# Patient Record
Sex: Female | Born: 1940 | Race: Black or African American | Hispanic: No | Marital: Married | Smoking: Former smoker
Health system: Southern US, Community
[De-identification: ages and names within clinical notes are randomized; demographics above are authoritative.]

## PROBLEM LIST (undated history)

## (undated) DIAGNOSIS — I82409 Acute embolism and thrombosis of unspecified deep veins of unspecified lower extremity: Secondary | ICD-10-CM

## (undated) DIAGNOSIS — E039 Hypothyroidism, unspecified: Secondary | ICD-10-CM

## (undated) DIAGNOSIS — D573 Sickle-cell trait: Secondary | ICD-10-CM

## (undated) DIAGNOSIS — N183 Chronic kidney disease, stage 3 unspecified: Secondary | ICD-10-CM

## (undated) DIAGNOSIS — I493 Ventricular premature depolarization: Secondary | ICD-10-CM

## (undated) DIAGNOSIS — E7800 Pure hypercholesterolemia, unspecified: Secondary | ICD-10-CM

## (undated) DIAGNOSIS — M069 Rheumatoid arthritis, unspecified: Secondary | ICD-10-CM

## (undated) DIAGNOSIS — A159 Respiratory tuberculosis unspecified: Secondary | ICD-10-CM

## (undated) DIAGNOSIS — E785 Hyperlipidemia, unspecified: Secondary | ICD-10-CM

## (undated) DIAGNOSIS — R7611 Nonspecific reaction to tuberculin skin test without active tuberculosis: Secondary | ICD-10-CM

## (undated) DIAGNOSIS — I350 Nonrheumatic aortic (valve) stenosis: Principal | ICD-10-CM

## (undated) DIAGNOSIS — D329 Benign neoplasm of meninges, unspecified: Secondary | ICD-10-CM

## (undated) DIAGNOSIS — K5790 Diverticulosis of intestine, part unspecified, without perforation or abscess without bleeding: Secondary | ICD-10-CM

## (undated) DIAGNOSIS — G629 Polyneuropathy, unspecified: Secondary | ICD-10-CM

## (undated) DIAGNOSIS — I10 Essential (primary) hypertension: Secondary | ICD-10-CM

## (undated) DIAGNOSIS — G8929 Other chronic pain: Secondary | ICD-10-CM

## (undated) DIAGNOSIS — D649 Anemia, unspecified: Secondary | ICD-10-CM

## (undated) DIAGNOSIS — I509 Heart failure, unspecified: Secondary | ICD-10-CM

## (undated) DIAGNOSIS — H269 Unspecified cataract: Secondary | ICD-10-CM

## (undated) DIAGNOSIS — M1990 Unspecified osteoarthritis, unspecified site: Secondary | ICD-10-CM

## (undated) HISTORY — PX: OTHER SURGICAL HISTORY: SHX169

## (undated) HISTORY — DX: Ventricular premature depolarization: I49.3

## (undated) HISTORY — PX: TONSILLECTOMY AND ADENOIDECTOMY: SHX28

## (undated) HISTORY — DX: Unspecified cataract: H26.9

## (undated) HISTORY — DX: Hyperlipidemia, unspecified: E78.5

## (undated) HISTORY — DX: Respiratory tuberculosis unspecified: A15.9

## (undated) HISTORY — PX: CATARACT EXTRACTION: SUR2

## (undated) HISTORY — DX: Nonrheumatic aortic (valve) stenosis: I35.0

---

## 1976-01-07 HISTORY — PX: ABDOMINAL HYSTERECTOMY: SHX81

## 1990-01-06 HISTORY — PX: TOTAL THYROIDECTOMY: SHX2547

## 1997-01-06 HISTORY — PX: ROTATOR CUFF REPAIR: SHX139

## 1997-09-23 ENCOUNTER — Emergency Department (HOSPITAL_COMMUNITY): Admission: EM | Admit: 1997-09-23 | Discharge: 1997-09-23 | Payer: Self-pay

## 1997-11-06 ENCOUNTER — Emergency Department (HOSPITAL_COMMUNITY): Admission: EM | Admit: 1997-11-06 | Discharge: 1997-11-06 | Payer: Self-pay | Admitting: Emergency Medicine

## 1997-11-06 ENCOUNTER — Encounter: Payer: Self-pay | Admitting: Emergency Medicine

## 1997-11-10 ENCOUNTER — Emergency Department (HOSPITAL_COMMUNITY): Admission: EM | Admit: 1997-11-10 | Discharge: 1997-11-10 | Payer: Self-pay | Admitting: Emergency Medicine

## 1997-12-05 ENCOUNTER — Encounter: Payer: Self-pay | Admitting: Internal Medicine

## 1997-12-05 ENCOUNTER — Ambulatory Visit (HOSPITAL_COMMUNITY): Admission: RE | Admit: 1997-12-05 | Discharge: 1997-12-05 | Payer: Self-pay | Admitting: Internal Medicine

## 1998-12-20 ENCOUNTER — Other Ambulatory Visit: Admission: RE | Admit: 1998-12-20 | Discharge: 1998-12-20 | Payer: Self-pay | Admitting: Obstetrics and Gynecology

## 1999-01-07 HISTORY — PX: JOINT REPLACEMENT: SHX530

## 1999-11-04 ENCOUNTER — Inpatient Hospital Stay (HOSPITAL_COMMUNITY): Admission: RE | Admit: 1999-11-04 | Discharge: 1999-11-12 | Payer: Self-pay | Admitting: Orthopedic Surgery

## 1999-11-06 ENCOUNTER — Encounter: Payer: Self-pay | Admitting: Orthopedic Surgery

## 1999-11-08 ENCOUNTER — Encounter: Payer: Self-pay | Admitting: Orthopedic Surgery

## 1999-11-10 ENCOUNTER — Encounter: Payer: Self-pay | Admitting: Internal Medicine

## 1999-11-12 ENCOUNTER — Inpatient Hospital Stay
Admission: RE | Admit: 1999-11-12 | Discharge: 1999-11-27 | Payer: Self-pay | Admitting: Physical Medicine & Rehabilitation

## 2000-05-18 ENCOUNTER — Ambulatory Visit (HOSPITAL_BASED_OUTPATIENT_CLINIC_OR_DEPARTMENT_OTHER): Admission: RE | Admit: 2000-05-18 | Discharge: 2000-05-18 | Payer: Self-pay | Admitting: Orthopedic Surgery

## 2000-09-16 ENCOUNTER — Emergency Department (HOSPITAL_COMMUNITY): Admission: EM | Admit: 2000-09-16 | Discharge: 2000-09-16 | Payer: Self-pay | Admitting: Emergency Medicine

## 2000-09-16 ENCOUNTER — Encounter: Payer: Self-pay | Admitting: Emergency Medicine

## 2001-02-09 ENCOUNTER — Encounter: Payer: Self-pay | Admitting: Family Medicine

## 2001-02-09 ENCOUNTER — Encounter: Admission: RE | Admit: 2001-02-09 | Discharge: 2001-02-09 | Payer: Self-pay | Admitting: Family Medicine

## 2001-10-14 ENCOUNTER — Encounter: Payer: Self-pay | Admitting: Orthopedic Surgery

## 2001-10-14 ENCOUNTER — Encounter: Admission: RE | Admit: 2001-10-14 | Discharge: 2001-10-14 | Payer: Self-pay | Admitting: Orthopedic Surgery

## 2002-03-13 ENCOUNTER — Emergency Department (HOSPITAL_COMMUNITY): Admission: EM | Admit: 2002-03-13 | Discharge: 2002-03-13 | Payer: Self-pay | Admitting: *Deleted

## 2002-04-26 ENCOUNTER — Encounter: Payer: Self-pay | Admitting: Internal Medicine

## 2002-04-26 ENCOUNTER — Ambulatory Visit (HOSPITAL_COMMUNITY): Admission: RE | Admit: 2002-04-26 | Discharge: 2002-04-26 | Payer: Self-pay | Admitting: Internal Medicine

## 2002-11-22 ENCOUNTER — Other Ambulatory Visit: Admission: RE | Admit: 2002-11-22 | Discharge: 2002-11-22 | Payer: Self-pay | Admitting: Obstetrics and Gynecology

## 2003-07-20 ENCOUNTER — Ambulatory Visit (HOSPITAL_COMMUNITY): Admission: RE | Admit: 2003-07-20 | Discharge: 2003-07-20 | Payer: Self-pay | Admitting: Internal Medicine

## 2003-07-21 ENCOUNTER — Ambulatory Visit (HOSPITAL_COMMUNITY): Admission: RE | Admit: 2003-07-21 | Discharge: 2003-07-21 | Payer: Self-pay | Admitting: Internal Medicine

## 2003-10-17 ENCOUNTER — Ambulatory Visit (HOSPITAL_COMMUNITY): Admission: RE | Admit: 2003-10-17 | Discharge: 2003-10-17 | Payer: Self-pay | Admitting: Internal Medicine

## 2004-03-07 ENCOUNTER — Ambulatory Visit (HOSPITAL_COMMUNITY): Admission: RE | Admit: 2004-03-07 | Discharge: 2004-03-07 | Payer: Self-pay | Admitting: Neurosurgery

## 2004-07-24 ENCOUNTER — Encounter: Admission: RE | Admit: 2004-07-24 | Discharge: 2004-07-24 | Payer: Self-pay | Admitting: Rheumatology

## 2004-08-02 ENCOUNTER — Ambulatory Visit: Payer: Self-pay | Admitting: Infectious Diseases

## 2005-01-01 ENCOUNTER — Other Ambulatory Visit: Admission: RE | Admit: 2005-01-01 | Discharge: 2005-01-01 | Payer: Self-pay | Admitting: Obstetrics and Gynecology

## 2005-03-05 ENCOUNTER — Other Ambulatory Visit: Admission: RE | Admit: 2005-03-05 | Discharge: 2005-03-05 | Payer: Self-pay | Admitting: Radiology

## 2005-03-08 ENCOUNTER — Encounter: Admission: RE | Admit: 2005-03-08 | Discharge: 2005-03-08 | Payer: Self-pay | Admitting: Orthopedic Surgery

## 2005-09-30 ENCOUNTER — Ambulatory Visit (HOSPITAL_COMMUNITY): Admission: RE | Admit: 2005-09-30 | Discharge: 2005-09-30 | Payer: Self-pay | Admitting: Internal Medicine

## 2005-10-15 ENCOUNTER — Ambulatory Visit (HOSPITAL_BASED_OUTPATIENT_CLINIC_OR_DEPARTMENT_OTHER): Admission: RE | Admit: 2005-10-15 | Discharge: 2005-10-15 | Payer: Self-pay | Admitting: Surgery

## 2005-10-15 ENCOUNTER — Encounter (INDEPENDENT_AMBULATORY_CARE_PROVIDER_SITE_OTHER): Payer: Self-pay | Admitting: Specialist

## 2006-01-06 DIAGNOSIS — D329 Benign neoplasm of meninges, unspecified: Secondary | ICD-10-CM

## 2006-01-06 HISTORY — DX: Benign neoplasm of meninges, unspecified: D32.9

## 2006-04-08 ENCOUNTER — Encounter: Admission: RE | Admit: 2006-04-08 | Discharge: 2006-04-08 | Payer: Self-pay | Admitting: Gastroenterology

## 2006-08-12 ENCOUNTER — Encounter: Admission: RE | Admit: 2006-08-12 | Discharge: 2006-08-12 | Payer: Self-pay | Admitting: Family Medicine

## 2006-08-29 ENCOUNTER — Encounter: Admission: RE | Admit: 2006-08-29 | Discharge: 2006-08-29 | Payer: Self-pay | Admitting: Otolaryngology

## 2006-09-24 ENCOUNTER — Encounter: Admission: RE | Admit: 2006-09-24 | Discharge: 2006-09-24 | Payer: Self-pay | Admitting: Otolaryngology

## 2006-10-21 ENCOUNTER — Encounter: Admission: RE | Admit: 2006-10-21 | Discharge: 2006-10-21 | Payer: Self-pay | Admitting: Neurosurgery

## 2006-12-28 ENCOUNTER — Ambulatory Visit: Payer: Self-pay | Admitting: Vascular Surgery

## 2006-12-28 ENCOUNTER — Emergency Department (HOSPITAL_COMMUNITY): Admission: EM | Admit: 2006-12-28 | Discharge: 2006-12-28 | Payer: Self-pay | Admitting: Podiatry

## 2007-01-25 ENCOUNTER — Encounter: Admission: RE | Admit: 2007-01-25 | Discharge: 2007-01-25 | Payer: Self-pay | Admitting: Neurosurgery

## 2007-06-09 ENCOUNTER — Ambulatory Visit: Payer: Self-pay | Admitting: Vascular Surgery

## 2007-07-10 ENCOUNTER — Inpatient Hospital Stay (HOSPITAL_COMMUNITY): Admission: EM | Admit: 2007-07-10 | Discharge: 2007-07-13 | Payer: Self-pay | Admitting: Emergency Medicine

## 2007-07-10 ENCOUNTER — Ambulatory Visit: Payer: Self-pay | Admitting: Internal Medicine

## 2007-07-22 ENCOUNTER — Encounter: Admission: RE | Admit: 2007-07-22 | Discharge: 2007-07-22 | Payer: Self-pay | Admitting: Neurosurgery

## 2007-08-31 ENCOUNTER — Encounter: Admission: RE | Admit: 2007-08-31 | Discharge: 2007-08-31 | Payer: Self-pay | Admitting: Gastroenterology

## 2007-11-07 HISTORY — PX: OTHER SURGICAL HISTORY: SHX169

## 2007-11-09 ENCOUNTER — Encounter (INDEPENDENT_AMBULATORY_CARE_PROVIDER_SITE_OTHER): Payer: Self-pay | Admitting: Neurosurgery

## 2007-11-09 ENCOUNTER — Inpatient Hospital Stay (HOSPITAL_COMMUNITY): Admission: RE | Admit: 2007-11-09 | Discharge: 2007-11-17 | Payer: Self-pay | Admitting: Neurosurgery

## 2007-12-01 ENCOUNTER — Encounter: Admission: RE | Admit: 2007-12-01 | Discharge: 2007-12-01 | Payer: Self-pay | Admitting: Family Medicine

## 2007-12-20 ENCOUNTER — Emergency Department (HOSPITAL_COMMUNITY): Admission: EM | Admit: 2007-12-20 | Discharge: 2007-12-20 | Payer: Self-pay | Admitting: Emergency Medicine

## 2008-02-09 ENCOUNTER — Emergency Department (HOSPITAL_COMMUNITY): Admission: EM | Admit: 2008-02-09 | Discharge: 2008-02-09 | Payer: Self-pay | Admitting: Emergency Medicine

## 2008-05-25 ENCOUNTER — Encounter: Admission: RE | Admit: 2008-05-25 | Discharge: 2008-05-25 | Payer: Self-pay | Admitting: Family Medicine

## 2008-08-22 ENCOUNTER — Encounter: Admission: RE | Admit: 2008-08-22 | Discharge: 2008-08-22 | Payer: Self-pay | Admitting: Neurosurgery

## 2008-11-24 ENCOUNTER — Encounter: Admission: RE | Admit: 2008-11-24 | Discharge: 2008-11-24 | Payer: Self-pay | Admitting: Gastroenterology

## 2009-05-27 ENCOUNTER — Inpatient Hospital Stay (HOSPITAL_COMMUNITY): Admission: EM | Admit: 2009-05-27 | Discharge: 2009-05-31 | Payer: Self-pay | Admitting: Emergency Medicine

## 2009-05-28 ENCOUNTER — Encounter (INDEPENDENT_AMBULATORY_CARE_PROVIDER_SITE_OTHER): Payer: Self-pay | Admitting: Internal Medicine

## 2009-08-08 ENCOUNTER — Encounter: Admission: RE | Admit: 2009-08-08 | Discharge: 2009-08-08 | Payer: Self-pay | Admitting: Neurosurgery

## 2009-11-21 ENCOUNTER — Observation Stay (HOSPITAL_COMMUNITY)
Admission: EM | Admit: 2009-11-21 | Discharge: 2009-11-25 | Payer: Self-pay | Source: Home / Self Care | Admitting: Emergency Medicine

## 2009-11-27 ENCOUNTER — Inpatient Hospital Stay (HOSPITAL_COMMUNITY)
Admission: EM | Admit: 2009-11-27 | Discharge: 2009-12-04 | Payer: Self-pay | Source: Home / Self Care | Admitting: Emergency Medicine

## 2009-12-04 ENCOUNTER — Ambulatory Visit: Payer: Self-pay | Admitting: Oncology

## 2009-12-12 LAB — CBC & DIFF AND RETIC
Basophils Absolute: 0 10*3/uL (ref 0.0–0.1)
Eosinophils Absolute: 0.3 10*3/uL (ref 0.0–0.5)
HCT: 28.7 % — ABNORMAL LOW (ref 34.8–46.6)
HGB: 8.9 g/dL — ABNORMAL LOW (ref 11.6–15.9)
Immature Retic Fract: 18.7 % — ABNORMAL HIGH (ref 0.00–10.70)
LYMPH%: 44.8 % (ref 14.0–49.7)
MCV: 79.1 fL — ABNORMAL LOW (ref 79.5–101.0)
MONO#: 0.4 10*3/uL (ref 0.1–0.9)
MONO%: 5.2 % (ref 0.0–14.0)
NEUT#: 3.4 10*3/uL (ref 1.5–6.5)
Platelets: 337 10*3/uL (ref 145–400)

## 2009-12-14 LAB — COMPREHENSIVE METABOLIC PANEL
ALT: 9 U/L (ref 0–35)
Albumin: 3.9 g/dL (ref 3.5–5.2)
Alkaline Phosphatase: 77 U/L (ref 39–117)
CO2: 26 mEq/L (ref 19–32)
Glucose, Bld: 161 mg/dL — ABNORMAL HIGH (ref 70–99)
Potassium: 3.4 mEq/L — ABNORMAL LOW (ref 3.5–5.3)
Sodium: 144 mEq/L (ref 135–145)
Total Protein: 6.4 g/dL (ref 6.0–8.3)

## 2009-12-14 LAB — ERYTHROPOIETIN: Erythropoietin: 124 m[IU]/mL — ABNORMAL HIGH (ref 2.6–34.0)

## 2009-12-14 LAB — SPEP & IFE WITH QIG
Albumin ELP: 52 % — ABNORMAL LOW (ref 55.8–66.1)
Alpha-2-Globulin: 11.2 % (ref 7.1–11.8)
Beta 2: 6.5 % (ref 3.2–6.5)
Beta Globulin: 6.6 % (ref 4.7–7.2)
IgA: 404 mg/dL — ABNORMAL HIGH (ref 68–378)
Total Protein, Serum Electrophoresis: 6.4 g/dL (ref 6.0–8.3)

## 2009-12-14 LAB — IRON AND TIBC
Iron: 35 ug/dL — ABNORMAL LOW (ref 42–145)
UIBC: 170 ug/dL

## 2009-12-14 LAB — HEMOGLOBINOPATHY EVALUATION
Hgb A2 Quant: 2.9 % (ref 2.2–3.2)
Hgb F Quant: 0 % (ref 0.0–2.0)
Hgb S Quant: 33.7 % — ABNORMAL HIGH (ref 0.0–0.0)

## 2009-12-14 LAB — FERRITIN: Ferritin: 265 ng/mL (ref 10–291)

## 2010-01-06 HISTORY — PX: SIGMOIDECTOMY: SHX176

## 2010-01-09 ENCOUNTER — Ambulatory Visit: Payer: Self-pay | Admitting: Oncology

## 2010-01-10 LAB — CBC WITH DIFFERENTIAL/PLATELET
BASO%: 0.4 % (ref 0.0–2.0)
Basophils Absolute: 0 10*3/uL (ref 0.0–0.1)
EOS%: 1.5 % (ref 0.0–7.0)
Eosinophils Absolute: 0.1 10*3/uL (ref 0.0–0.5)
HCT: 29.8 % — ABNORMAL LOW (ref 34.8–46.6)
HGB: 9.7 g/dL — ABNORMAL LOW (ref 11.6–15.9)
LYMPH%: 38.1 % (ref 14.0–49.7)
MCH: 26.5 pg (ref 25.1–34.0)
MCHC: 32.5 g/dL (ref 31.5–36.0)
MCV: 81.4 fL (ref 79.5–101.0)
MONO#: 0.7 10*3/uL (ref 0.1–0.9)
MONO%: 6.9 % (ref 0.0–14.0)
NEUT#: 5.3 10*3/uL (ref 1.5–6.5)
NEUT%: 53.1 % (ref 38.4–76.8)
Platelets: 446 10*3/uL — ABNORMAL HIGH (ref 145–400)
RBC: 3.66 10*6/uL — ABNORMAL LOW (ref 3.70–5.45)
RDW: 23.6 % — ABNORMAL HIGH (ref 11.2–14.5)
WBC: 9.9 10*3/uL (ref 3.9–10.3)
lymph#: 3.8 10*3/uL — ABNORMAL HIGH (ref 0.9–3.3)

## 2010-01-10 LAB — URINALYSIS, MICROSCOPIC - CHCC
Glucose: NEGATIVE g/dL
Protein: 2000 mg/dL
Specific Gravity, Urine: 1.03 (ref 1.003–1.035)
pH: 6 (ref 4.6–8.0)

## 2010-01-24 ENCOUNTER — Ambulatory Visit (HOSPITAL_COMMUNITY)
Admission: RE | Admit: 2010-01-24 | Discharge: 2010-01-24 | Payer: Self-pay | Source: Home / Self Care | Attending: Gastroenterology | Admitting: Gastroenterology

## 2010-01-25 NOTE — Op Note (Signed)
  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  MEDICATIONS:  Per Anesthesia, propofol 227 mg IV, fentanyl 100 mcg IV, Versed 2 mg IV.  FINDINGS:  Rectal exam was normal.  A pediatric colonoscope was inserted into a fair prepped colon and advanced to the descending colon where there was a significant edema with an ulcerated mass appearance in the descending colon approximately 45 cm from the rectum.  There was diffuse diverticula distal to this area and some diverticula noted near this area as well.  This area was very friable, ulcerated and edematous, concerning for malignancy.  The lumen could not be identified and the colonoscope could not pass proximal to this area.  Biopsies were taken for histologic purposes.  2 cc of Spot were injected in the distal mucosa from the lesion for tattooing and was successful.  On careful withdrawal of the colonoscope, diffuse diverticulosis was seen in the sigmoid colon.  Retroflexion revealed medium-sized internal hemorrhoids.  ASSESSMENT: 1. Obstructing lesion at 45 cm concerning for malignancy, although     complicated diverticulitis could have this appearance as well.     Followup on pathology. 2. Status post tattooing of distal part of colonic site. 3. Diffuse sigmoid diverticulosis. 4. Medium-sized internal hemorrhoids.  PLAN: 1. Follow up on pathology. 2. Discussed findings with Dr. Greer [name] for further management of this obstruction.     Julia Ng, MD     VCS/MEDQ  D:  01/24/2010  T:  01/24/2010  Job:  [id number]  cc:   Julia Ruff. Redmond Pulling, MD [address] [place] [address]  Julia Coma, MD Fax: [phone]  Electronically Signed by Julia Corner MD on 01/25/2010 10:48:45 AM

## 2010-01-27 ENCOUNTER — Encounter: Payer: Self-pay | Admitting: Neurosurgery

## 2010-03-07 HISTORY — PX: OTHER SURGICAL HISTORY: SHX169

## 2010-03-12 ENCOUNTER — Other Ambulatory Visit: Payer: Self-pay | Admitting: Medical

## 2010-03-12 ENCOUNTER — Encounter (HOSPITAL_BASED_OUTPATIENT_CLINIC_OR_DEPARTMENT_OTHER): Payer: Medicare Other | Admitting: Oncology

## 2010-03-12 DIAGNOSIS — D539 Nutritional anemia, unspecified: Secondary | ICD-10-CM

## 2010-03-12 LAB — CBC WITH DIFFERENTIAL/PLATELET
Basophils Absolute: 0 10*3/uL (ref 0.0–0.1)
EOS%: 2.3 % (ref 0.0–7.0)
Eosinophils Absolute: 0.2 10*3/uL (ref 0.0–0.5)
HCT: 31.2 % — ABNORMAL LOW (ref 34.8–46.6)
HGB: 10.2 g/dL — ABNORMAL LOW (ref 11.6–15.9)
LYMPH%: 34.7 % (ref 14.0–49.7)
MCH: 26.6 pg (ref 25.1–34.0)
MCV: 81.9 fL (ref 79.5–101.0)
MONO%: 5.4 % (ref 0.0–14.0)
NEUT#: 4.2 10*3/uL (ref 1.5–6.5)
NEUT%: 57 % (ref 38.4–76.8)
Platelets: 481 10*3/uL — ABNORMAL HIGH (ref 145–400)
RDW: 18.9 % — ABNORMAL HIGH (ref 11.2–14.5)

## 2010-03-13 LAB — COMPREHENSIVE METABOLIC PANEL
AST: 12 U/L (ref 0–37)
Alkaline Phosphatase: 80 U/L (ref 39–117)
BUN: 10 mg/dL (ref 6–23)
Creatinine, Ser: 1.14 mg/dL (ref 0.40–1.20)
Glucose, Bld: 94 mg/dL (ref 70–99)
Total Bilirubin: 0.5 mg/dL (ref 0.3–1.2)

## 2010-03-13 LAB — IRON AND TIBC
%SAT: 10 % — ABNORMAL LOW (ref 20–55)
TIBC: 222 ug/dL — ABNORMAL LOW (ref 250–470)
UIBC: 200 ug/dL

## 2010-03-13 LAB — ERYTHROPOIETIN: Erythropoietin: 26.6 m[IU]/mL (ref 2.6–34.0)

## 2010-03-13 LAB — FERRITIN: Ferritin: 333 ng/mL — ABNORMAL HIGH (ref 10–291)

## 2010-03-19 LAB — URINALYSIS, ROUTINE W REFLEX MICROSCOPIC
Bilirubin Urine: NEGATIVE
Nitrite: NEGATIVE
Nitrite: POSITIVE — AB
Protein, ur: >300 mg/dL — AB
Specific Gravity, Urine: 1.015 (ref 1.005–1.030)
Urobilinogen, UA: 1 mg/dL (ref 0.0–1.0)
Urobilinogen, UA: 1 mg/dL (ref 0.0–1.0)
pH: 5.5 (ref 5.0–8.0)

## 2010-03-19 LAB — CULTURE, BLOOD (ROUTINE X 2)
Culture  Setup Time: 201111170656
Culture: NO GROWTH
Culture: NO GROWTH
Culture: NO GROWTH

## 2010-03-19 LAB — HEMOGLOBIN AND HEMATOCRIT, BLOOD
HCT: 19.1 % — ABNORMAL LOW (ref 36.0–46.0)
HCT: 19.8 % — ABNORMAL LOW (ref 36.0–46.0)
Hemoglobin: 6.3 g/dL — CL (ref 12.0–15.0)

## 2010-03-19 LAB — CBC
HCT: 21.1 % — ABNORMAL LOW (ref 36.0–46.0)
HCT: 25.8 % — ABNORMAL LOW (ref 36.0–46.0)
Hemoglobin: 6.2 g/dL — CL (ref 12.0–15.0)
Hemoglobin: 6.4 g/dL — CL (ref 12.0–15.0)
Hemoglobin: 6.7 g/dL — CL (ref 12.0–15.0)
Hemoglobin: 6.9 g/dL — CL (ref 12.0–15.0)
Hemoglobin: 7.6 g/dL — ABNORMAL LOW (ref 12.0–15.0)
MCH: 25.1 pg — ABNORMAL LOW (ref 26.0–34.0)
MCH: 25.2 pg — ABNORMAL LOW (ref 26.0–34.0)
MCH: 25.2 pg — ABNORMAL LOW (ref 26.0–34.0)
MCH: 25.4 pg — ABNORMAL LOW (ref 26.0–34.0)
MCH: 25.4 pg — ABNORMAL LOW (ref 26.0–34.0)
MCHC: 32.9 g/dL (ref 30.0–36.0)
MCHC: 32.9 g/dL (ref 30.0–36.0)
MCHC: 33.4 g/dL (ref 30.0–36.0)
MCV: 76.1 fL — ABNORMAL LOW (ref 78.0–100.0)
MCV: 76.5 fL — ABNORMAL LOW (ref 78.0–100.0)
MCV: 76.7 fL — ABNORMAL LOW (ref 78.0–100.0)
MCV: 76.9 fL — ABNORMAL LOW (ref 78.0–100.0)
MCV: 77.8 fL — ABNORMAL LOW (ref 78.0–100.0)
Platelets: 332 10*3/uL (ref 150–400)
Platelets: 350 10*3/uL (ref 150–400)
Platelets: 360 10*3/uL (ref 150–400)
Platelets: 389 10*3/uL (ref 150–400)
Platelets: 417 10*3/uL — ABNORMAL HIGH (ref 150–400)
Platelets: 440 10*3/uL — ABNORMAL HIGH (ref 150–400)
Platelets: 514 10*3/uL — ABNORMAL HIGH (ref 150–400)
RBC: 2.42 MIL/uL — ABNORMAL LOW (ref 3.87–5.11)
RBC: 2.51 MIL/uL — ABNORMAL LOW (ref 3.87–5.11)
RBC: 2.64 MIL/uL — ABNORMAL LOW (ref 3.87–5.11)
RBC: 2.73 MIL/uL — ABNORMAL LOW (ref 3.87–5.11)
RBC: 3.03 MIL/uL — ABNORMAL LOW (ref 3.87–5.11)
RBC: 3.36 MIL/uL — ABNORMAL LOW (ref 3.87–5.11)
RDW: 20.3 % — ABNORMAL HIGH (ref 11.5–15.5)
RDW: 20.8 % — ABNORMAL HIGH (ref 11.5–15.5)
RDW: 21 % — ABNORMAL HIGH (ref 11.5–15.5)
RDW: 22.5 % — ABNORMAL HIGH (ref 11.5–15.5)
WBC: 11.3 10*3/uL — ABNORMAL HIGH (ref 4.0–10.5)
WBC: 15.6 10*3/uL — ABNORMAL HIGH (ref 4.0–10.5)
WBC: 6.7 10*3/uL (ref 4.0–10.5)
WBC: 7.5 10*3/uL (ref 4.0–10.5)
WBC: 7.7 10*3/uL (ref 4.0–10.5)

## 2010-03-19 LAB — GLUCOSE, CAPILLARY
Glucose-Capillary: 102 mg/dL — ABNORMAL HIGH (ref 70–99)
Glucose-Capillary: 107 mg/dL — ABNORMAL HIGH (ref 70–99)
Glucose-Capillary: 122 mg/dL — ABNORMAL HIGH (ref 70–99)
Glucose-Capillary: 128 mg/dL — ABNORMAL HIGH (ref 70–99)
Glucose-Capillary: 130 mg/dL — ABNORMAL HIGH (ref 70–99)
Glucose-Capillary: 135 mg/dL — ABNORMAL HIGH (ref 70–99)
Glucose-Capillary: 137 mg/dL — ABNORMAL HIGH (ref 70–99)
Glucose-Capillary: 139 mg/dL — ABNORMAL HIGH (ref 70–99)
Glucose-Capillary: 143 mg/dL — ABNORMAL HIGH (ref 70–99)
Glucose-Capillary: 144 mg/dL — ABNORMAL HIGH (ref 70–99)
Glucose-Capillary: 144 mg/dL — ABNORMAL HIGH (ref 70–99)
Glucose-Capillary: 145 mg/dL — ABNORMAL HIGH (ref 70–99)
Glucose-Capillary: 161 mg/dL — ABNORMAL HIGH (ref 70–99)
Glucose-Capillary: 183 mg/dL — ABNORMAL HIGH (ref 70–99)
Glucose-Capillary: 202 mg/dL — ABNORMAL HIGH (ref 70–99)
Glucose-Capillary: 207 mg/dL — ABNORMAL HIGH (ref 70–99)
Glucose-Capillary: 208 mg/dL — ABNORMAL HIGH (ref 70–99)
Glucose-Capillary: 231 mg/dL — ABNORMAL HIGH (ref 70–99)
Glucose-Capillary: 233 mg/dL — ABNORMAL HIGH (ref 70–99)
Glucose-Capillary: 270 mg/dL — ABNORMAL HIGH (ref 70–99)
Glucose-Capillary: 288 mg/dL — ABNORMAL HIGH (ref 70–99)
Glucose-Capillary: 302 mg/dL — ABNORMAL HIGH (ref 70–99)
Glucose-Capillary: 335 mg/dL — ABNORMAL HIGH (ref 70–99)
Glucose-Capillary: 41 mg/dL — CL (ref 70–99)
Glucose-Capillary: 439 mg/dL — ABNORMAL HIGH (ref 70–99)
Glucose-Capillary: 95 mg/dL (ref 70–99)

## 2010-03-19 LAB — URINE CULTURE: Culture  Setup Time: 201111170628

## 2010-03-19 LAB — BASIC METABOLIC PANEL
BUN: 23 mg/dL (ref 6–23)
CO2: 24 mEq/L (ref 19–32)
Calcium: 8.9 mg/dL (ref 8.4–10.5)
Calcium: 9 mg/dL (ref 8.4–10.5)
Chloride: 110 mEq/L (ref 96–112)
Creatinine, Ser: 1.38 mg/dL — ABNORMAL HIGH (ref 0.4–1.2)
Creatinine, Ser: 1.43 mg/dL — ABNORMAL HIGH (ref 0.4–1.2)
Creatinine, Ser: 1.45 mg/dL — ABNORMAL HIGH (ref 0.4–1.2)
GFR calc Af Amer: 42 mL/min — ABNORMAL LOW (ref >60)
GFR calc Af Amer: 44 mL/min — ABNORMAL LOW (ref >60)
GFR calc Af Amer: 46 mL/min — ABNORMAL LOW (ref >60)
GFR calc non Af Amer: 34 mL/min — ABNORMAL LOW (ref >60)
GFR calc non Af Amer: 36 mL/min — ABNORMAL LOW (ref >60)
GFR calc non Af Amer: 36 mL/min — ABNORMAL LOW (ref >60)
Glucose, Bld: 268 mg/dL — ABNORMAL HIGH (ref 70–99)
Potassium: 4.3 mEq/L (ref 3.5–5.1)
Sodium: 139 mEq/L (ref 135–145)

## 2010-03-19 LAB — COMPREHENSIVE METABOLIC PANEL
ALT: 20 U/L (ref 0–35)
ALT: 23 U/L (ref 0–35)
AST: 13 U/L (ref 0–37)
AST: 16 U/L (ref 0–37)
AST: 17 U/L (ref 0–37)
Albumin: 2.4 g/dL — ABNORMAL LOW (ref 3.5–5.2)
Albumin: 2.4 g/dL — ABNORMAL LOW (ref 3.5–5.2)
Albumin: 3.3 g/dL — ABNORMAL LOW (ref 3.5–5.2)
Alkaline Phosphatase: 110 U/L (ref 39–117)
BUN: 16 mg/dL (ref 6–23)
CO2: 21 mEq/L (ref 19–32)
CO2: 22 mEq/L (ref 19–32)
CO2: 23 mEq/L (ref 19–32)
Calcium: 8.3 mg/dL — ABNORMAL LOW (ref 8.4–10.5)
Chloride: 106 mEq/L (ref 96–112)
Chloride: 109 mEq/L (ref 96–112)
Chloride: 110 mEq/L (ref 96–112)
Chloride: 118 mEq/L — ABNORMAL HIGH (ref 96–112)
Creatinine, Ser: 1.03 mg/dL (ref 0.4–1.2)
Creatinine, Ser: 1.32 mg/dL — ABNORMAL HIGH (ref 0.4–1.2)
Creatinine, Ser: 1.44 mg/dL — ABNORMAL HIGH (ref 0.4–1.2)
GFR calc Af Amer: 44 mL/min — ABNORMAL LOW (ref >60)
GFR calc Af Amer: 50 mL/min — ABNORMAL LOW (ref >60)
GFR calc Af Amer: >60 mL/min (ref >60)
GFR calc non Af Amer: 36 mL/min — ABNORMAL LOW (ref >60)
GFR calc non Af Amer: 41 mL/min — ABNORMAL LOW (ref >60)
GFR calc non Af Amer: 53 mL/min — ABNORMAL LOW (ref >60)
Glucose, Bld: 54 mg/dL — ABNORMAL LOW (ref 70–99)
Potassium: 3.7 mEq/L (ref 3.5–5.1)
Potassium: 3.8 mEq/L (ref 3.5–5.1)
Sodium: 142 mEq/L (ref 135–145)
Sodium: 145 mEq/L (ref 135–145)
Total Bilirubin: 0.7 mg/dL (ref 0.3–1.2)
Total Bilirubin: 0.9 mg/dL (ref 0.3–1.2)
Total Bilirubin: 1.1 mg/dL (ref 0.3–1.2)
Total Bilirubin: 1.1 mg/dL (ref 0.3–1.2)

## 2010-03-19 LAB — POCT CARDIAC MARKERS
CKMB, poc: 1 ng/mL (ref 1.0–8.0)
CKMB, poc: <1 ng/mL — ABNORMAL LOW (ref 1.0–8.0)
CKMB, poc: <1 ng/mL — ABNORMAL LOW (ref 1.0–8.0)
Myoglobin, poc: 181 ng/mL (ref 12–200)
Troponin i, poc: <0.05 ng/mL (ref 0.00–0.09)
Troponin i, poc: <0.05 ng/mL (ref 0.00–0.09)

## 2010-03-19 LAB — DIFFERENTIAL
Basophils Absolute: 0 10*3/uL (ref 0.0–0.1)
Basophils Absolute: 0 10*3/uL (ref 0.0–0.1)
Basophils Absolute: 0 10*3/uL (ref 0.0–0.1)
Basophils Absolute: 0 10*3/uL (ref 0.0–0.1)
Basophils Relative: 0 % (ref 0–1)
Basophils Relative: 0 % (ref 0–1)
Eosinophils Absolute: 0.1 10*3/uL (ref 0.0–0.7)
Eosinophils Absolute: 0.2 10*3/uL (ref 0.0–0.7)
Eosinophils Relative: 3 % (ref 0–5)
Lymphocytes Relative: 21 % (ref 12–46)
Lymphocytes Relative: 26 % (ref 12–46)
Lymphocytes Relative: 43 % (ref 12–46)
Lymphs Abs: 2.9 10*3/uL (ref 0.7–4.0)
Lymphs Abs: 3.1 10*3/uL (ref 0.7–4.0)
Monocytes Absolute: 0.5 10*3/uL (ref 0.1–1.0)
Monocytes Absolute: 0.7 10*3/uL (ref 0.1–1.0)
Monocytes Relative: 7 % (ref 3–12)
Neutro Abs: 11.4 10*3/uL — ABNORMAL HIGH (ref 1.7–7.7)
Neutro Abs: 5.7 10*3/uL (ref 1.7–7.7)
Neutro Abs: 8.1 10*3/uL — ABNORMAL HIGH (ref 1.7–7.7)
Neutrophils Relative %: 73 % (ref 43–77)

## 2010-03-19 LAB — CLOSTRIDIUM DIFFICILE EIA: C difficile Toxins A+B, EIA: NEGATIVE

## 2010-03-19 LAB — CARDIAC PANEL(CRET KIN+CKTOT+MB+TROPI)
CK, MB: 0.4 ng/mL (ref 0.3–4.0)
Relative Index: INVALID (ref 0.0–2.5)
Relative Index: INVALID (ref 0.0–2.5)
Total CK: 26 U/L (ref 7–177)
Troponin I: 0.02 ng/mL (ref 0.00–0.06)

## 2010-03-19 LAB — URINE MICROSCOPIC-ADD ON

## 2010-03-19 LAB — POCT I-STAT, CHEM 8
BUN: 16 mg/dL (ref 6–23)
Chloride: 106 mEq/L (ref 96–112)
Glucose, Bld: 133 mg/dL — ABNORMAL HIGH (ref 70–99)
HCT: 30 % — ABNORMAL LOW (ref 36.0–46.0)
Potassium: 4 mEq/L (ref 3.5–5.1)

## 2010-03-19 LAB — FERRITIN: Ferritin: 341 ng/mL — ABNORMAL HIGH (ref 10–291)

## 2010-03-19 LAB — IRON AND TIBC
Saturation Ratios: 16 % — ABNORMAL LOW (ref 20–55)
Saturation Ratios: 37 % (ref 20–55)
TIBC: 231 ug/dL — ABNORMAL LOW (ref 250–470)
UIBC: 150 ug/dL

## 2010-03-19 LAB — STOOL CULTURE

## 2010-03-19 LAB — RETICULOCYTES
Retic Count, Absolute: 59.5 10*3/uL (ref 19.0–186.0)
Retic Ct Pct: 1.9 % (ref 0.4–3.1)
Retic Ct Pct: 2.1 % (ref 0.4–3.1)

## 2010-03-19 LAB — IRON: Iron: 340 ug/dL — ABNORMAL HIGH (ref 42–135)

## 2010-03-19 LAB — CLOSTRIDIUM DIFFICILE BY PCR: Toxigenic C. Difficile by PCR: NOT DETECTED

## 2010-03-19 LAB — FOLATE: Folate: >20 ng/mL

## 2010-03-19 LAB — TSH: TSH: 4.702 u[IU]/mL — ABNORMAL HIGH (ref 0.350–4.500)

## 2010-03-19 LAB — VITAMIN B12: Vitamin B-12: 680 pg/mL (ref 211–911)

## 2010-03-19 LAB — HEMOCCULT GUIAC POC 1CARD (OFFICE): Fecal Occult Bld: NEGATIVE

## 2010-03-25 LAB — DIFFERENTIAL
Eosinophils Absolute: 0.3 10*3/uL (ref 0.0–0.7)
Eosinophils Relative: 2 % (ref 0–5)
Monocytes Absolute: 1.3 10*3/uL — ABNORMAL HIGH (ref 0.1–1.0)
Neutrophils Relative %: 43 % (ref 43–77)

## 2010-03-25 LAB — COMPREHENSIVE METABOLIC PANEL
AST: 32 U/L (ref 0–37)
Albumin: 3.4 g/dL — ABNORMAL LOW (ref 3.5–5.2)
Alkaline Phosphatase: 97 U/L (ref 39–117)
Chloride: 103 mEq/L (ref 96–112)
Creatinine, Ser: 1.51 mg/dL — ABNORMAL HIGH (ref 0.4–1.2)
GFR calc Af Amer: 41 mL/min — ABNORMAL LOW (ref >60)
Potassium: 4.5 mEq/L (ref 3.5–5.1)
Total Bilirubin: 0.8 mg/dL (ref 0.3–1.2)

## 2010-03-25 LAB — GLUCOSE, CAPILLARY
Glucose-Capillary: 178 mg/dL — ABNORMAL HIGH (ref 70–99)
Glucose-Capillary: 180 mg/dL — ABNORMAL HIGH (ref 70–99)
Glucose-Capillary: 204 mg/dL — ABNORMAL HIGH (ref 70–99)
Glucose-Capillary: 270 mg/dL — ABNORMAL HIGH (ref 70–99)
Glucose-Capillary: 284 mg/dL — ABNORMAL HIGH (ref 70–99)
Glucose-Capillary: 291 mg/dL — ABNORMAL HIGH (ref 70–99)
Glucose-Capillary: 353 mg/dL — ABNORMAL HIGH (ref 70–99)
Glucose-Capillary: 379 mg/dL — ABNORMAL HIGH (ref 70–99)
Glucose-Capillary: 392 mg/dL — ABNORMAL HIGH (ref 70–99)
Glucose-Capillary: 429 mg/dL — ABNORMAL HIGH (ref 70–99)
Glucose-Capillary: 489 mg/dL — ABNORMAL HIGH (ref 70–99)

## 2010-03-25 LAB — BASIC METABOLIC PANEL
BUN: 32 mg/dL — ABNORMAL HIGH (ref 6–23)
CO2: 24 mEq/L (ref 19–32)
Calcium: 9.2 mg/dL (ref 8.4–10.5)
Chloride: 109 mEq/L (ref 96–112)
Creatinine, Ser: 1.16 mg/dL (ref 0.4–1.2)
GFR calc Af Amer: 56 mL/min — ABNORMAL LOW (ref >60)
GFR calc non Af Amer: 46 mL/min — ABNORMAL LOW (ref >60)
Glucose, Bld: 248 mg/dL — ABNORMAL HIGH (ref 70–99)
Glucose, Bld: 315 mg/dL — ABNORMAL HIGH (ref 70–99)
Potassium: 4.7 mEq/L (ref 3.5–5.1)
Sodium: 140 mEq/L (ref 135–145)
Sodium: 140 mEq/L (ref 135–145)

## 2010-03-25 LAB — URINALYSIS, ROUTINE W REFLEX MICROSCOPIC
Bilirubin Urine: NEGATIVE
Bilirubin Urine: NEGATIVE
Glucose, UA: >1000 mg/dL — AB
Hgb urine dipstick: NEGATIVE
Ketones, ur: NEGATIVE mg/dL
Nitrite: NEGATIVE
Protein, ur: NEGATIVE mg/dL
Specific Gravity, Urine: 1.027 (ref 1.005–1.030)
Specific Gravity, Urine: 1.027 (ref 1.005–1.030)
Urobilinogen, UA: 0.2 mg/dL (ref 0.0–1.0)
Urobilinogen, UA: 0.2 mg/dL (ref 0.0–1.0)

## 2010-03-25 LAB — CBC
HCT: 24.9 % — ABNORMAL LOW (ref 36.0–46.0)
Hemoglobin: 8.3 g/dL — ABNORMAL LOW (ref 12.0–15.0)
Hemoglobin: 8.3 g/dL — ABNORMAL LOW (ref 12.0–15.0)
MCHC: 31.9 g/dL (ref 30.0–36.0)
MCHC: 33 g/dL (ref 30.0–36.0)
MCV: 84.4 fL (ref 78.0–100.0)
MCV: 85.9 fL (ref 78.0–100.0)
Platelets: 300 10*3/uL (ref 150–400)
Platelets: 307 10*3/uL (ref 150–400)
Platelets: 329 10*3/uL (ref 150–400)
Platelets: 332 10*3/uL (ref 150–400)
RDW: 16.9 % — ABNORMAL HIGH (ref 11.5–15.5)
RDW: 17.1 % — ABNORMAL HIGH (ref 11.5–15.5)
WBC: 14.7 10*3/uL — ABNORMAL HIGH (ref 4.0–10.5)
WBC: 16.1 10*3/uL — ABNORMAL HIGH (ref 4.0–10.5)

## 2010-03-25 LAB — CARDIAC PANEL(CRET KIN+CKTOT+MB+TROPI)
CK, MB: 1.1 ng/mL (ref 0.3–4.0)
CK, MB: 1.2 ng/mL (ref 0.3–4.0)
Relative Index: INVALID (ref 0.0–2.5)
Total CK: 76 U/L (ref 7–177)
Total CK: 77 U/L (ref 7–177)

## 2010-03-25 LAB — POCT I-STAT, CHEM 8
Calcium, Ion: 1.11 mmol/L — ABNORMAL LOW (ref 1.12–1.32)
HCT: 30 % — ABNORMAL LOW (ref 36.0–46.0)
TCO2: 23 mmol/L (ref 0–100)

## 2010-03-25 LAB — URINE CULTURE

## 2010-03-25 LAB — HEMOGLOBIN A1C: Mean Plasma Glucose: 200 mg/dL — ABNORMAL HIGH (ref <117)

## 2010-03-25 LAB — BRAIN NATRIURETIC PEPTIDE: Pro B Natriuretic peptide (BNP): <30 pg/mL (ref 0.0–100.0)

## 2010-03-25 LAB — GLUCOSE, RANDOM: Glucose, Bld: 581 mg/dL (ref 70–99)

## 2010-04-23 LAB — COMPREHENSIVE METABOLIC PANEL
ALT: 20 U/L (ref 0–35)
AST: 17 U/L (ref 0–37)
CO2: 26 mEq/L (ref 19–32)
Calcium: 9.7 mg/dL (ref 8.4–10.5)
Creatinine, Ser: 0.88 mg/dL (ref 0.4–1.2)
GFR calc Af Amer: >60 mL/min (ref >60)
GFR calc non Af Amer: >60 mL/min (ref >60)
Glucose, Bld: 100 mg/dL — ABNORMAL HIGH (ref 70–99)
Sodium: 141 mEq/L (ref 135–145)
Total Protein: 7.8 g/dL (ref 6.0–8.3)

## 2010-04-23 LAB — CBC
MCHC: 32.6 g/dL (ref 30.0–36.0)
MCV: 82.8 fL (ref 78.0–100.0)
RDW: 18 % — ABNORMAL HIGH (ref 11.5–15.5)

## 2010-04-23 LAB — DIFFERENTIAL
Eosinophils Absolute: 0.3 10*3/uL (ref 0.0–0.7)
Lymphocytes Relative: 38 % (ref 12–46)
Lymphs Abs: 5.2 10*3/uL — ABNORMAL HIGH (ref 0.7–4.0)
Monocytes Relative: 5 % (ref 3–12)
Neutrophils Relative %: 54 % (ref 43–77)

## 2010-04-23 LAB — POCT CARDIAC MARKERS
CKMB, poc: 1.1 ng/mL (ref 1.0–8.0)
Troponin i, poc: <0.05 ng/mL (ref 0.00–0.09)

## 2010-04-23 LAB — URINE MICROSCOPIC-ADD ON

## 2010-04-23 LAB — GLUCOSE, CAPILLARY: Glucose-Capillary: 77 mg/dL (ref 70–99)

## 2010-04-23 LAB — URINALYSIS, ROUTINE W REFLEX MICROSCOPIC
Nitrite: POSITIVE — AB
Specific Gravity, Urine: 1.013 (ref 1.005–1.030)
Urobilinogen, UA: 0.2 mg/dL (ref 0.0–1.0)
pH: 5.5 (ref 5.0–8.0)

## 2010-04-23 LAB — SEDIMENTATION RATE: Sed Rate: 50 mm/hr — ABNORMAL HIGH (ref 0–22)

## 2010-04-23 LAB — URINE CULTURE: Colony Count: >=100000

## 2010-05-20 ENCOUNTER — Ambulatory Visit: Payer: Medicare Other | Attending: Family Medicine

## 2010-05-20 DIAGNOSIS — R5381 Other malaise: Secondary | ICD-10-CM | POA: Insufficient documentation

## 2010-05-20 DIAGNOSIS — M6281 Muscle weakness (generalized): Secondary | ICD-10-CM | POA: Insufficient documentation

## 2010-05-20 DIAGNOSIS — IMO0001 Reserved for inherently not codable concepts without codable children: Secondary | ICD-10-CM | POA: Insufficient documentation

## 2010-05-20 DIAGNOSIS — M2560 Stiffness of unspecified joint, not elsewhere classified: Secondary | ICD-10-CM | POA: Insufficient documentation

## 2010-05-21 NOTE — Op Note (Signed)
[name], [name]           ACCOUNT NO.:  [id number]   MEDICAL RECORD NO.:  [id number]          PATIENT TYPE:  INP   LOCATION:  3109                         FACILITY:  [facility]   PHYSICIAN:  Marchia Meiers. Vertell Limber, M.D.  DATE OF BIRTH:  19-Apr-1940   DATE OF PROCEDURE:  11/09/2007  DATE OF DISCHARGE:                               OPERATIVE REPORT   PREOPERATIVE DIAGNOSIS:  Right sphenoid wing meningioma.   POSTOPERATIVE DIAGNOSIS:  Right sphenoid wing meningioma.   PROCEDURE:  Right pterional craniotomy for meningioma.   SURGEON:  Marchia Meiers. Vertell Limber, MD   ASSISTANT:  1. Elizabeth Sauer, MD  2. Verdis Prime, RN   ANESTHESIA:  General endotracheal anesthesia.   ESTIMATED BLOOD LOSS:  Minimal.   COMPLICATIONS:  None.   DISPOSITION:  Recovery.   INDICATIONS:  [name] is a 70 year old [place] with  diabetes, with an enlarging right sphenoid wing meningioma.  It was  elected to take her to surgery for craniotomy and resection of this  tumor.   PROCEDURE:  [name] was brought to the operating room.  Following  satisfactory and uncomplicated induction of general endotracheal and  placement of intravenous lines and Foley catheter, she was placed in the  supine position on the operating table where her head was placed in 3-  pinhead fixation.  She was turned with the right side of her head toward  the ceiling in 30 degrees off the horizontal exposing the right  pterional region.  The scalp was then shaved and prepped and draped in  the usual sterile fashion.  The area of planned incision was infiltrated  with local lidocaine with epinephrine.  A curvilinear pterional incision  was then made and Raney clips were applied.  The temporalis fascia was  incised sharply and then the temporalis muscle was cut with Bovie and  the scalp flap and muscle were brought forward on block with exposing  the sphenoid right to sylvian region.  The trephine was then placed at  the  keyhole over the temporal squama and posteriorly at the superior  temporal line and a bone flap was elevated.  Using a smaller bur, the  sphenoid wing was then drilled down to be able to gain access to the  most anterior aspect of the tumor and to remove all adherent dura.  The  dura was then opened in a curvilinear fashion exposing the tumor, which  was carefully rolled out from the sylvian fissure and the arachnoid  layer appeared to be intact.  The dural attachments were along the  sphenoid wing and there was some hyperostosis which was removed with  Melrose Nakayama rongeur.  The medial meningeal artery had been significantly  hypertrophied and this was controlled both with bipolar electrocautery  and with bone wax.  The dural tail of the tumor was then removed with  Beyer rongeur and the remaining dural attachment was cauterized with  bipolar electrocautery.  Hemostasis was assured and it was elected to  place a Dura-Guard 6 cm x 8 cm dural patch because I had removed to  considerable amount of dura in an effort  to get to clean dural margins.  This was tacked loosely in position.  The bone flap was then  reapproximated with titanium plates and the temporalis fascia was closed  with 2-0 Vicryl sutures.  The galea was reapproximated with 2-0 Vicryl  sutures and the skin edges were approximated with staples.  The wound  was dressed with bacitracin, Telfa, and OpSite.  The patient was taken  out of pins, extubated in the operating room, and taken to the recovery  room in stable satisfactory having tolerated the operation well.  Counts  were correct at the end of the case.      Marchia Meiers. Vertell Limber, M.D.  Electronically Signed     JDS/MEDQ  D:  11/09/2007  T:  11/09/2007  Job:  [id number]

## 2010-05-21 NOTE — Consult Note (Signed)
NAMEALICEANN, ARRANAGA           ACCOUNT NO.:  [id number]   MEDICAL RECORD NO.:  [id number]          PATIENT TYPE:  INP   LOCATION:  2039                         FACILITY:  [facility]   PHYSICIAN:  Barnett Abu, M.D.  DATE OF BIRTH:  November 16, 1940   DATE OF CONSULTATION:  07/12/2007  DATE OF DISCHARGE:                                 CONSULTATION   REASON FOR CONSULTATION:  Chest pain.   REQUESTING PHYSICIAN:  Annita Brod, M.D.   HISTORY OF PRESENT ILLNESS:  [name] is a pleasant 70 year old  woman without history of coronary disease, has developed left-sided and  substernal chest pain initially described as an achiness and then  progressed to a sharpness with left anterior chest radiation into the  left arm and shoulder and her hand with tingle.  She initially went to  Urgent Care after about 4 hours of waxing and waning chest discomfort.  She did receive sublingual nitroglycerin at the emergency center and was  transferred to our emergency department.  When she arrived here, no  further chest discomfort.  She has been admitted and observed.  EKGs  have remained nonacute.  Cardiac markers have been negative x3.   This morning, she had recurrence of chest discomfort very similar to the  previous episodes.  This has occurred twice.  Most recently she has  received 2 sublingual nitroglycerin and 2 mg of morphine with moderate  resolution of the pain.  She does note that there is a pleuritic  component.  She cannot take a deep breath while the pain is occurring.   ALLERGIES TO MEDICATIONS:  PENICILLIN.   CURRENT MEDICATIONS:  1. Aspirin 325 mg p.o. daily.  2. Lovenox 40 mg q.24 h. increasing to 100 mg q.12 h.  3. Enbrel 50 mg.  4. Folic acid 1 mg p.o. daily.  5. Sliding scale insulin.  6. Lantus insulin 20 units subcu daily.  7. Synthroid 150 mcg p.o. daily.  8. Glucophage 500 mg p.o. b.i.d.  9. Benicar 40 mg p.o. daily.  10.Lovaza 1 g p.o. daily.  11.Lyrica 75  mg p.o. b.i.d.  12.Zocor 20 mg p.o. daily.  13.Sulindac 150 mg p.o. b.i.d.   SOCIAL HISTORY:  Former smoker, quit several years ago.  No ethanol, no  illicit drugs, married.   FAMILY HISTORY:  Son died at age 39 of myocardial infarction.  Maternal  grandmother had coronary disease later in life.   PAST MEDICAL HISTORY:  1. Rheumatoid arthritis.  2. Hypothyroidism.  3. Diabetes mellitus.  4. Hypercholesterolemia.  5. Chronic anemia.  6. Gout.  7. Remote hysterectomy and thyroidectomy secondary to goiter.  8. Bilateral TKR.  9. Left breast papilloma lesion excision, 2007.   REVIEW OF SYSTEMS:  Negative except as mentioned above.   PHYSICAL EXAMINATION:  VITAL SIGNS:  Blood pressure 131/64, pulse is 99  and regular, respiratory rate 18, temperature 97.2, O2 saturation 98% of  2 L.  GENERAL:  This is a comfortable-appearing 70 year old African American  woman who has recently received morphine.  Some of the word finding is a  little slow.  HEENT:  Unremarkable.  NECK:  Supple without thyromegaly or masses.  The carotid upstrokes are  normal.  There is no bruit.  There is a thyroidectomy scar present.  CHEST:  Clear with adequate excursion.  HEART:  Regular rhythm.  Normal S1 and S2 is heard.  No S3, S4, murmur,  click, or rub noted.  The left anterior chest wall is somewhat tender to  palpation.  ABDOMEN:  Obese, soft, nontender.  No midline pulsatile mass.  Bowel  sounds present all quadrants.  EXTERNAL GENITALIA:  Without lesions.  RECTAL:  Not performed.  EXTREMITY:  Show full range of motion.  No edema.  Intact distal pulses.  There are bilateral anterior knee scars.  SKIN:  Warm, dry, and clear.  NEUROLOGICAL:  Exam is nonfocal.   ACCESSORY CLINICAL DATA:  CK-MB and troponin negative x3.  Serum  electrolytes, BUN, and creatinine normal.  Glucose 152, hemoglobin is  9.2, hematocrit 27.9, platelets 315,000, white blood cell count 7600.   Chest x-ray no active disease.   There is mild atelectasis at the right  base.  Left shoulder x-ray, no evidence of acute injury.  Electrocardiogram from earlier today, sinus bradycardia, borderline LVH  by voltage and some T-wave flattening in the anterior precordial leads.   ASSESSMENT:  1. Chest pain almost noncardiac in nature although the radiation of      the discomfort into the left arm is concerning.  There is a      significant pleuritic component to suggest the possibility of      pulmonary disease or pulmonary embolism.  2. Multiple risk factors for coronary heart disease including diabetes      mellitus, age, family history of an elevated cholesterol.  3. Rheumatoid arthritis.  4. Hypothyroidism.  5. Chronic and fairly significant anemia.   PLAN:  1. I am recommending proceeding with a CT angiogram for rule out      pulmonary embolism protocol at this time.  2. If this is negative, then we will consider proceeding with cardiac      catheterization on July 13, 2007, versus a myocardial perfusion      imaging study.  3. We will add IV nitroglycerin and increase Lovenox to therapeutic      doses at 100 mg q.12 h.  4. The patient is receiving aspirin.   Thank you very much for allowing me to assist in the care of [facility].  It has been a pleasure to do so.  I will discuss her further  care with you.      Barnett Abu, M.D.  Electronically Signed     JHE/MEDQ  D:  07/12/2007  T:  07/12/2007  Job:  [id number]   cc:   Lilian Coma, MD

## 2010-05-21 NOTE — Procedures (Signed)
CAROTID DUPLEX EXAM   INDICATION:  Left carotid bruit.   HISTORY:  Diabetes:  Yes.  Cardiac:  No.  Hypertension:  Yes.  Smoking:  Quit 30 years ago.  Previous Surgery:  None.  CV History:  Right arm pain.  Amaurosis Fugax No paresthesias No, Hemiparesis No                                       RIGHT             LEFT  Brachial systolic pressure:         140               130  Brachial Doppler waveforms:         Biphasic          Biphasic  Vertebral direction of flow:        Antegrade         Antegrade  DUPLEX VELOCITIES (cm/sec)  CCA peak systolic                   110               [id number]  ECA peak systolic                   87                [id number]  ICA peak systolic                   95                97  ICA end diastolic                   23                25  PLAQUE MORPHOLOGY:                  Heterogenous      Heterogenous  PLAQUE AMOUNT:                      Mild              Mild  PLAQUE LOCATION:                    ICA               ICA   IMPRESSION:  1. 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  2. Antegrade bilateral vertebral arteries.  3. Questionable intimal dissection noted at the right proximal common      carotid artery.   ___________________________________________  Jessy Oto Fields, MD   MG/MEDQ  D:  12/28/2006  T:  12/29/2006  Job:  [id number]

## 2010-05-21 NOTE — Assessment & Plan Note (Signed)
OFFICE VISIT   [name], [name]  DOB:  1940-11-07                                       06/09/2007  [id number]   The patient is a 70 year old female who was last seen December 28, 2006  for possible carotid dissection.  She had duplex ultrasound which had  suggested a dissection in the right common carotid artery.  However,  this was not confirmed by CT angiogram.  She continues to have pain in  the right side of her neck, shoulder and arm.  This has not changed  significantly since December.  Of note, she has multiple joint arthritis  in her lower extremities and also has problems with her right shoulder  and is currently followed by Dr. Para March for this.  She denies any  neurologic symptoms such as TIA, amaurosis or stroke.  Her  atherosclerotic risk factors continue to include a remote smoking  history 40 years ago.  She also has diabetes, hypertension, and elevated  cholesterol.   PHYSICAL EXAM:  Blood pressure is 159/76 in the left arm, 151/75 in the  right arm, pulse is 70 and regular.  HEENT is unremarkable.  She has 2+  carotid pulses without bruit.  Chest is clear to auscultation.  Cardiac  exam is regular rate and rhythm without bruit or without murmur.   She had a carotid duplex exam today which again showed no evidence of  carotid dissection.  She had less than 40% stenosis of her internal  carotid arteries bilaterally.   I do not believe that this patient's pain in her arm and right neck are  related to carotid disease.  This may be more related to her arthritis  and shoulder problems.  I do not believe she warrants further followup  of her carotid arteries at this point.  She does have multiple  atherosclerotic risk factors and it may be worthwhile to repeat a  carotid duplex exam in 4 or 5 years.  She will follow up with me on an  as-needed basis or if she has symptoms suggestive of TIA.   Jessy Oto. Fields, MD  Electronically Signed   CEF/MEDQ  D:  06/09/2007  T:  06/10/2007  Job:  [id number]   cc:   Lilian Coma, MD

## 2010-05-21 NOTE — Procedures (Signed)
CAROTID DUPLEX EXAM   INDICATION:  Right-sided weakness and numbness for 2-3 weeks.  Patient  complains of transient shading of her left eye, each episode lasting  approximately 15 minutes.   HISTORY:  Diabetes:  Yes.  Cardiac:  No.  Hypertension:  Yes.  Smoking:  Quit.  Previous Surgery:  No.  CV History:  Amaurosis Fugax ?, Paresthesias Yes, Hemiparesis Yes.                                       RIGHT             LEFT  Brachial systolic pressure:         134               140  Brachial Doppler waveforms:         WNL               WNL  Vertebral direction of flow:        Antegrade         Antegrade  DUPLEX VELOCITIES (cm/sec)  CCA peak systolic                   84                85  ECA peak systolic                   72                57  ICA peak systolic                   81                93  ICA end diastolic                   19                26  PLAQUE MORPHOLOGY:                  Heterogenous      Heterogenous  PLAQUE AMOUNT:                      Mild              Mild  PLAQUE LOCATION:                    ICA               ICA    IMPRESSION:  1. No apparent dissection noted in right common carotid artery.  2. Bilateral 1-39% internal carotid artery stenosis.  3. Patent external carotid arteries.  4. Bilateral antegrade flow in vertebral arteries.   Study essentially unchanged from that on 12/28/06.       ___________________________________________  Jessy Oto. Fields, MD   PB/MEDQ  D:  06/09/2007  T:  06/09/2007  Job:  [id number]

## 2010-05-21 NOTE — Discharge Summary (Signed)
[name], [name]           ACCOUNT NO.:  [id number]   MEDICAL RECORD NO.:  [id number]          PATIENT TYPE:  INP   LOCATION:  2039                         FACILITY:  [facility]   PHYSICIAN:  Annita Brod, M.D.DATE OF BIRTH:  04-20-1940   DATE OF ADMISSION:  07/10/2007  DATE OF DISCHARGE:  07/13/2007                               DISCHARGE SUMMARY   PRIMARY CARE PHYSICIAN:  Lilian Coma, MD   CONSULTANTS:  Barnett Abu, MD, Cardiology.   DISCHARGE DIAGNOSES:  1. Left shoulder pain.  2. Chest pain, noncardiac.  3. 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  4. Hypothyroidism.  5. History of chronic anemia.  6. Anxiety.   DISCHARGE MEDICATIONS:  The patient will continue all of her previous  medications.  These are as follows:  1. Lantus 20 subcu daily.  2. Enbrel 50 subcu weekly on Thursday.  3. Humalog 6 units t.i.d. with meals.  4. Methotrexate 8 units p.o. on Thursdays.  5. Systane 1 drop both eyes p.r.n.  6. Metformin 500 p.o. b.i.d.  7. Lyrica 75 p.o. b.i.d.  8. Sulindac 150 p.o. b.i.d.  9. Synthroid 150 mcg p.o. daily.  10.Fish oil 1 tablet p.o. daily.  11.Diovan 320 p.o. daily.  12.Zocor 20 p.o. daily.  13.Aspirin 81 p.o. daily.  14.Vicodin 5/500 p.o. p.r.n.  15.Folic acid 1 mg p.o. daily.   New medications for this patient:  1. Flexeril 10 mg p.o. t.i.d. p.r.n.  2. Percocet 5/325 1 p.o. q.6 h. p.r.n.   The patient will follow up [name] in the next 7 days.   HISTORY OF PRESENT ILLNESS:  The patient is a 70 year old African  American female with past medical history of hypertension and diabetes,  who presented with several days of severe left shoulder pain radiating  down into her left side of her chest.  She became concerned as the pain  was quite severe and she came into the emergency room for further  evaluation.  Her initial set of enzymes and EKG was unremarkable.  By  hospital day #2, the patient's 3 set of enzymes were  negative, but  because of her history of diabetes it was felt best that she stay for a  stress test.  On hospital day #3, the patient was unable to get an  adenosine Cardiolite stress test because one to be Cardiolite machines  was broken.  The patient was evaluated by Cardiology and because she was  having some episodes of active chest pain as well as a previous history  of diabetes, it was felt best that she needs to stay for an inpatient  stress test rather than having done as an outpatient.  The patient  underwent an impatient adenosine Cardiolite stress test on July 13, 2007.  This returned back in the afternoon hours as being normal.  Negative for  ischemia.  The patient was felt to be medically stable for discharge.  Her other medical issues were unremarkable during this hospitalization.  TSH was checked which was normal.  D-dimer was checked and found to be  negative and slightly elevated, but a CT angio  of her chest was negative  for PE and the patient's left shoulder was x-rayed, showing signs of  just old surgical repair, but no signs of any acute fractures.  She was  felt to have some musculoskeletal shoulder pain, which she is receiving  Flexeril and Percocet prescriptions for.  In regards to her diabetes and  other medical issues, these were stable.  The patient's overall  disposition improved.  Activity will be slowly increase.  Her discharge  diet will be a heart-healthy, carb-modified diet and she is being  discharged home.      Annita Brod, M.D.  Electronically Signed     SKK/MEDQ  D:  07/13/2007  T:  07/14/2007  Job:  [id number]   cc:   Barnett Abu, M.D.  Lilian Coma, MD

## 2010-05-21 NOTE — H&P (Signed)
NAMEDRESDEN, BOKHARI           ACCOUNT NO.:  [id number]   MEDICAL RECORD NO.:  [id number]          PATIENT TYPE:  INP   LOCATION:  2039                         FACILITY:  [facility]   PHYSICIAN:  Thayer Headings, MD    DATE OF BIRTH:  December 27, 1940   DATE OF ADMISSION:  07/10/2007  DATE OF DISCHARGE:                              HISTORY & PHYSICAL   PRIMARY CARE PHYSICIAN:  Dr. Cammy Copa of [place].   CHIEF COMPLAINTS:  Chest pain.   HISTORY OF PRESENT ILLNESS:  This is a 70 year old female with diabetes,  rheumatoid arthritis, and hypothyroidism, who presented with left-sided  chest pain that is intermittent and sharp that radiated down her left  arm.  She described the pain as moderate.  She also complained of  dyspnea with the pain.  This was relieved by nitro.  First episode of  this did occur last night and went away spontaneously.  It returned at  11 a.m. this morning and was relieved by three sublingual nitros at the  [facility] where she went to prior to the emergency room.  She  currently is without chest pain.   Past medical history includes,  1. Rheumatoid arthritis.  2. Hypothyroidism.  3. Diabetes.  4. Hypercholesterolemia.  5. Chronic anemia.   MEDICATIONS:  The patient is unsure of all her doses at this time, but  includes,  1. Diovan.  2. Flexeril.  3. Folic acid 1 mg b.i.d.  4. Lantus insulin 20 units q.a.m. and p.r.n. Humalog.  5. Metformin 500 mg b.i.d.  6. Methotrexate every Thursday.  7. Etanercept every Thursday.  8. Omega 3 fatty acid.  9. Lyrica 75 mg b.i.d.  10.Aspirin 81 mg daily.  11.Synthroid.   DRUG ALLERGIES:  PENICILLIN causes rash.   FAMILY HISTORY:  No early cardiac death.   SOCIAL HISTORY:  Denies any tobacco, alcohol, or drugs.   Review of systems is negative except as per the history present illness.   PHYSICAL EXAMINATION:  VITALS:  Temperature is 98.3, pulse 61,  respirations 16, blood pressure 107/58, and O2 sats  98%.  GENERAL:  The patient is awake, alert and oriented x3, and appears in no  acute distress.  CARDIOVASCULAR:  Regular rate and rhythm with no murmurs, rubs, or  gallops.  LUNGS:  Clear to auscultation bilaterally.  ABDOMEN:  Soft, nontender, and nondistended with positive bowel sounds  and no hepatosplenomegaly.  EXTREMITIES:  No edema.   Laboratory data includes CK-MB of 1.4, troponin is less than 0.05, WBC  is 9.2, hemoglobin 9, platelets 239, glucose 172, sodium 142, potassium  2.9, and creatinine of 1.0.  Chest x-ray has not been done, is pending  at this time.   ASSESSMENT/PLAN:  1. Chest pain.  We will rule out myocardial infarction, per telemetry      and continue with nitro paste, morphine.  The patient will receive      aspirin if she has not already and will receive p.r.n. meds.  We      will hold her metformin and blood pressure meds at this time and we  will make her n.p.o. after midnight in case there was any      intervention required, cardiac stress test, or otherwise.  2. Rheumatic fever.  The patient is not due for her methotrexate or      other medications until Thursday.  We will continue with folic      acid.  3. Diabetes.  We will hold metformin at this time for in case she does      require catheterization.  We will put on a sliding scale insulin      and continue with her Lantus.      Thayer Headings, MD  Electronically Signed     RWC/MEDQ  D:  07/10/2007  T:  07/11/2007  Job:  [id number]

## 2010-05-21 NOTE — Consult Note (Signed)
Julia [name], [name]           ACCOUNT NO.:  [id number]   MEDICAL RECORD NO.:  [id number]          PATIENT TYPE:  EMS   LOCATION:  [facility]                         FACILITY:  [facility]   PHYSICIAN:  Julia Oto. Fields, MD  DATE OF BIRTH:  [birth year]   DATE OF CONSULTATION:  12/28/2006  DATE OF DISCHARGE:  12/28/2006                                 CONSULTATION   REQUESTING PHYSICIAN:  Julia [name] emergency department.   REASON FOR CONSULTATION:  Rule out carotid dissection.   HISTORY OF PRESENT ILLNESS:  The patient is a 70 year old female who was  referred to the [facility] emergency department by Dr. Stephanie [name] with  [facility].  She was seen by Dr. Stephanie [name] approximately one  week ago.  At the time she was having a routine office visit.  She was  found to have a left carotid bruit.  Of note, the patient had been  experiencing some pain in the right neck and shoulder region for  approximately 2 weeks.  She also had had some weakness and numbness of  her right hand.  The patient attributed this to her rheumatoid arthritis  which has been chronic.  She has had no episodes of slurred speech, TIA  or stroke symptoms.  Her atherosclerotic risk factors include  hypertension, diabetes, elevated cholesterol.  She has not had any blood  pressure over 150 mmHg as far as she knows.  The patient subsequently  had a carotid duplex exam today to evaluate her asymptomatic carotid  bruit.  This showed a questionable right common carotid artery intimal  flap.  There was no significant stenosis.  Vertebral arteries were  antegrade bilaterally.   PAST MEDICAL HISTORY:  Is as listed above.  In addition, she has asthma.   PAST SURGICAL HISTORY:  She had a hysterectomy, thyroidectomy for  goiter, left total knee replacement.   SOCIAL HISTORY:  She is a former smoker but quit over 40 years ago.   MEDICATIONS:  Include Lyrica, Synthroid, Amaryl, Plaquenil, sulindac,  Flexeril, albuterol, Vicodin,  Janumet, Diovan, Zocor and Lantus insulin.  She is allergic to PENICILLIN.   REVIEW OF SYSTEMS:  She had some mild shortness of breath in recent  wheezing.  She states she has also had a recent upper respiratory  infection.  She denies chest pain, TIA or stroke in the past.  Her  diabetes is fairly well controlled after recent addition of insulin.  However, prior to this it was in the 200s to 300s.  She has multi-joint  arthritis.  Her asthma has been well controlled recently.  She has no  prior history of hypercoagulable state.   FAMILY HISTORY:  Is remarkable for her mother had diabetes.   PHYSICAL EXAMINATION:  Temperature is 97, blood pressure is 138/64,  heart rate is in the 80s, respirations 16.  GENERAL:  She is a black female in no acute distress.  HEENT:  Unremarkable.  NECK:  Has 2+ carotid pulses without bruits.  CHEST:  Clear to auscultation.  CARDIAC EXAM:  Regular rate and rhythm without murmur.  ABDOMEN:  Obese, soft, nontender, nondistended, no masses.  EXTREMITIES:  She has a 1+ dorsalis pedis pulses bilaterally.  She has  2+ brachial and radial pulses bilaterally.  Feet are warm and there is  no edema.  NEUROLOGIC EXAM:  She is alert and oriented x3.  Upper extremity and  lower extremity motor exam was 5/5 and symmetric.  This is bilateral.  She has no obvious sensory deficits to light touch.   Carotid duplex exam from earlier today at [facility]  was reviewed.  This showed less than 40% stenosis bilaterally.  There  was antegrade vertebral flow.  There was questionable right proximal  carotid artery intimal flap.   ASSESSMENT:  The patient has a possible intimal flap in the right common  carotid artery.  This is currently asymptomatic from a neurologic  standpoint.  Her blood pressures is fairly well controlled.  Will check  a CT angiogram to further delineate whether or not a dissection is  present.      Julia Oto. Fields, MD   Electronically Signed     CEF/MEDQ  D:  12/28/2006  T:  12/29/2006  Job:  [id number]   cc:   Julia Coma, MD

## 2010-05-22 ENCOUNTER — Ambulatory Visit: Payer: Medicare Other | Admitting: Physical Therapy

## 2010-05-24 NOTE — Discharge Summary (Signed)
[name], [name]           ACCOUNT NO.:  [id number]   MEDICAL RECORD NO.:  [id number]          PATIENT TYPE:  INP   LOCATION:  3013                         FACILITY:  [facility]   PHYSICIAN:  Marchia Meiers. Vertell Limber, M.D.  DATE OF BIRTH:  [birth year]   DATE OF ADMISSION:  11/09/2007  DATE OF DISCHARGE:  11/17/2007                               DISCHARGE SUMMARY   ADMISSION DIAGNOSES:  1. Benign neoplasm cerebral meninges.  2. Rheumatoid arthritis.  3. Asthma without status asthmaticus.  4. Hypertension, not otherwise specified.  5. Diabetes mellitus, type 2.  6. Sickle cell trait.  7. Myalgia.  8. Myositis.   FINAL DIAGNOSES:  1. Benign neoplasm cerebral meninges.  2. Rheumatoid arthritis.  3. Asthma without status asthmaticus.  4. Hypertension, not otherwise specified.  5. Diabetes mellitus, type 2.  6. Sickle cell trait.  7. Myalgia.  8. Myositis.   HISTORY OF ILLNESS AND HOSPITAL COURSE:  [name] is a 70-year-  old woman whom I had been monitoring with a known right sphenoid wing  meningioma.  This was enlarging in size on serial MRIs.  It was  therefore elected to take her to surgery for craniotomy and removal of  this meningioma.  The patient was taken to surgery on November 09, 2007,  underwent uneventful craniotomy and resection of meningioma.  Postoperatively, she was doing well.  She was very slow to mobilize;  however, and this seemed more related to baseline issues related to  rheumatoid arthritis and myalgias rather than to neurosurgical issues.  Physical Therapy worked with her.  She was gradually moved ambulating  with walker and was doing well at that point and was discharged to home  with instructions to follow up in 10 days postoperatively for staple  removal.      Marchia Meiers. Vertell Limber, M.D.  Electronically Signed     JDS/MEDQ  D:  12/24/2007  T:  12/24/2007  Job:  [id number]

## 2010-05-24 NOTE — Op Note (Signed)
. [facility]  Patient:    [name], [name]                  MRN: [id number] Proc. Date: 05/18/00 Adm. Date:  [id number] Attending:  Clydie Braun                           Operative Report  PREOPERATIVE DIAGNOSIS:  Left knee arthrofibrosis, status post total knee replacement.  POSTOPERATIVE DIAGNOSIS:  Left knee arthrofibrosis, status post total knee replacement.  PROCEDURE:  Left examination under anesthesia, followed by manipulation under anesthesia and cortisone injection.  SURGEON:  Audree Camel. Noemi Chapel, M.D.  ANESTHESIA:  General.  OPERATIVE TIME:  15 minutes.  COMPLICATIONS:  None.  INDICATIONS:  [name] is a 71 year old woman who had undergone a left total knee replacement three months ago.  He has had persistent problems with a decreased range of motion and pain, despite intensive physical therapy, and is now to undergo an examination under anesthesia, a manipulation and injection.  DESCRIPTION OF PROCEDURE:  The patient was brought to the operating room on May 18, 2000.  Placed under general anesthesia on her stretcher.  Her initial range of motion of the knee was from -3 to 85 degrees.  A gentle manipulation was carried out, breaking up soft adhesions, and improving flexion to 120 degrees.  Extension remained at -3.  The knee remained stable to ligamentous examination, with normal patella tracking.  After this was done, the knee was sterilely injected with 1 cc of Depo-Medrol and 10 cc of Marcaine under sterile conditions.  She also received Ancef intraoperatively for prophylaxis. At this point, she was awakened and taken to the recovery room in stable condition.  FOLLOW-UP CARE:  [name] will be followed as an outpatient on Vicodin and Vioxx.  I will see her back in the office in one week for a recheck and followup. DD:  05/18/00 TD:  05/18/00 Job: [id number]

## 2010-05-24 NOTE — Op Note (Signed)
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   OPERATION:  Needle-guided excision of left breast mass.   SURGEON:  Dr. Margot Chimes   ANESTHESIA:  General.   CLINICAL HISTORY:  This is a 51-year lady who had an abnormality noted on  mammogram and a biopsy showed papilloma but incompletely removed and thought  to need complete excision.   DESCRIPTION OF PROCEDURE:  The patient was seen in the holding area and she  had no further questions.  She confirmed the left breast as the operative  side and I reviewed the needle localizing films.  I also marked the left  breast as the operative side.   The patient was taken to the operating room and after satisfactory general  anesthesia had been obtained, the left breast was prepped and draped.  The  time-out occurred.   For postop analgesia, I injected 0.5% Marcaine with epi around the surgical  site into the skin and subcutaneous tissues.  I then made a curvilinear  incision.  There is an X on the skin marking the skin overlying the mass and  that is where I centered the incision.  It was about a centimeter and a half  below the areolar margin.  Once I got into subcutaneous tissues, I elevated  a little skin flap inferiorly to identify the guidewire and manipulated it  into the wound.  I then took the tissue around the guidewire going until I  felt couple I was past the tip of the guidewire and removed all of this  tissue.  This was sent for specimen  mammogram.   I made sure everything was dry using cautery for hemostasis.  I injected  additional local to help with postop analgesia.  I then close the incision  with 3-0 Vicryl and 4-0 Monocryl subcuticular plus Dermabond.   Dr. Hamilton Capri called to report that the small marking clip and the mass  appeared to be on the specimen mammogram.   The patient tolerated the procedure well.  There were no operative  complications.  All counts were correct.      Haywood Lasso, M.D.  Electronically Signed     CJS/MEDQ  D:  10/15/2005  T:  10/16/2005  Job:  [id number]   cc:   Jeanann Lewandowsky, M.D.

## 2010-05-24 NOTE — Discharge Summary (Signed)
[place]  Patient:    [name], [name]                  MRN: [id number] Adm. Date:  [id number] Disc. Date: [id number] Attending:  Alger Simons T Dictator:   Kirstin Adelberger, P.A.                           Discharge Summary  ADMITTING DIAGNOSES: 1. End stage DJD. 2. Left knee sickle cell trait. 3. Hypothyroidism.  DISCHARGE DIAGNOSES: 1. End stage degenerative joint disease, left knee. 2. Status post total knee replacement. 3. Postoperative blood loss anemia. 4. Hypothyroidism. 5. Sickle cell trait. 6. Fever of unknown origin.  HISTORY OF PRESENT ILLNESS: The patient is a 70 year old black female with a history of bilateral end stage DJD of both knees, left significantly greater than right. She has had pain for many years, however, increasingly worse over the last year.  She has difficulty getting in and out of bed, going up and down stairs, getting in and out of a seated position.  She understands the risks, benefits, and possible complications of a total knee replacement and is without question.  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  PROCEDURE IN HOUSE ON 11/04/99:  The patient underwent a left total knee replacement.  She tolerated the procedure well.  Epidural was placed postoperatively.  Postoperative day one, hemoglobin was 8.7, pain was under control with her Epidural, T max is 100.8.  She was started on Epogen 40,000 units Monday, Wednesday, and Friday as well as [place] 1 p.o. q d. Postoperative day two, T max is 102.5.  INR is 1.2, hemoglobin is 8.8, surgical wound is well approximated.  Postoperative day two, patient had difficulty with shortness of breath and insomnia.  O2 saturations were 93%. Heart rate was 88.  Cardiology consult was called.  [facility] evaluated.  They did a CT scan to rule out pulmonary embolus.  Postoperative day three, patient was  significantly improved.  She was alert and oriented.  T max was 100.6, blood pressure 140/60, hemoglobin 8.1.  VQ scan was negative. She was still on Telemetry due to her shortness of breath and dizziness. Physical therapy was held due to this activity.  Postoperative day four, patient once again feeling well.  Hemoglobin was 8, surgical wound was well approximated.  Recommended continued CPM as she could not tolerate physical therapy.  Infectious disease was consulted on 11/08/99 for fever of unknown origin.  Patient was started on Tequin 400 mg 1 p.o. q d.  On 11/09/99, surgical wounds are well approximated. Echo was ordered to evaluate heart valves.  Echo came back negative to rule out SBE.  On 11/10/99, T max was 101, O2 saturations were 93%, positive blood culture was deemed to be at contaminate.  On 11/11/99, postoperative day seven, T max of 100, T now of 99.2, surgical wound well approximated.  Postoperative day eight, hemoglobin 7.4.  Patient is stable from a cardiology standpoint.  Stable from an infectious disease standpoint.  Stable from a orthopedic standpoint except for her anemia.  She was transferred to [facility] in stable condition for physical therapy and monitoring until her anemia improved.  We will continue to follow her on [facility].  We encouraged ambulation and weight bearing as tolerated.  CPM eight hours a day. DD:  12/11/99 TD:  12/11/99 Job: [id number]

## 2010-05-24 NOTE — Op Note (Signed)
[place]  Patient:    [name], [name]                  MRN: [id number] Proc. Date: 11/04/99 Adm. Date:  [id number] Attending:  Clydie Braun                           Operative Report  PREOPERATIVE DIAGNOSIS:  Left knee degenerative joint disease.  POSTOPERATIVE DIAGNOSIS:  Left knee degenerative joint disease.  PROCEDURES: 1. Left total knee replacement using an [employer] Scorpio total knee system    with #7 cemented femoral component, #7 cemented tibial component, with    15 mm polyethylene tibial spacer, and 26 mm polyethylene cemented patella. 2. Left knee lateral retinacular release.  SURGEON:  Audree Camel. Noemi Chapel, M.D.  ASSISTANT:  Kirstin A. Shepperson, P.A.  ANESTHESIA:  General.  OPERATIVE TIME:  One hour 20 minutes.  COMPLICATIONS:  None.  DESCRIPTION OF PROCEDURE:  [name] is brought to the operating room on November 04, 1999, placed on the operative table in supine position.  After an adequate level of general anesthesia was obtained, her left knee was examined under anesthesia.  Range of motion from 0-100 and 25 degrees, moderate varus deformity, 1+ crepitation knee, stable ligamentous exam with normal patella tracking.  She had a Foley catheter placed under sterile conditions and received Ancef 1 g IV preoperatively for prophylaxis.  The left leg was prepped using sterile Betadine and draped using sterile technique.  The leg was exsanguinated and a thigh tourniquet elevated 375 mm.  Initially, through a 20 cm longitudinal incision based over the patella, initial exposure was made.  The underlying subcutaneous tissues were excised in line with the skin incision.  A median arthrotomy was performed, revealing an excessive amount of normal-appearing joint fluid.  The articular surfaces were inspected.  She had grade 3 and 4 changes medially, grade 2 and 3 changes laterally, grade 3 and 4 changes in the  patellofemoral joint.  The medial and lateral meniscal remnants were removed, as well as the anterior cruciate ligament, as well as osteophytes off the femoral condyles.  An intermedullary drill was then drilled up the femoral canal for placement of the distal femoral cutting jig, which was placed in the appropriate amount of rotation, and a distal 12 mm cut was made.  The distal femur was then sized. A #7 was found to be the appropriate size, and the #7 cutting guide was placed, and these cuts were then made.  The proximal tibia was exposed.  The tibial spines were removed with an oscillating saw.  The intermedullary drill was drilled down the tibial canal for placement of the proximal tibial cutting jig, which was set in the appropriate amount of rotation, and taking 6 mm off the medial or lower side, this cut was made.  The cut surface of the tibia was sized.  A #7 was found to be appropriate size.  The Scorpio PCL cutter was then placed back on the femur and these cuts were made.  After this was done, the #7 femoral component trial was placed, the #7 tibial base plate with a 15 mm polyethylene spacer was placed, and then the knee taken through a range of motion 0-125 degrees with excellent stability and no lift-off on the tray.  The tray was then marked for rotation and then the keel cut was made.  After this was done, the patella was  sized.  A 26 mm was found to be the appropriate size, and a recessed 10 mm x 26 mm cut was made and three locking holes placed.  After this was done, it was felt that all the trial components were of excellent size, fit, and stability.  The knee was irrigated with 3 liters of saline solution.  The proximal tibia was then exposed.  The #7 base plate with cement backing was then hammered into position with an excellent fit, with excess cement being removed from around the edges.  The #7 femoral component with cement backing was hammered into position,  also with an excellent fit, with excess cement being removed from around the edges.  The 15 mm polyethylene spacer was then locked onto the tibial base plate, knee taken through a range of motion 0-130 degrees with no lift-off on the tray and excellent stability.  The 26 mm patella button was locked into its recessed hole and held there with a clamp.  After the cement hardened, patellofemoral tracking was evaluated.  There was still some lateral tightness to this, and thus a lateral retinacular release was carried out, and this improved patella tracking to normal.  At this point, it was felt that all the components were of excellent size, fit, and stability.  The knee was further irrigated with antibiotic solution. The tourniquet was released.  Hemostasis was obtained with cautery.  The arthrotomy was then closed with #1 Panacryl suture over two medium Hemovac drains, subcutaneous tissues closed with 0 and 2-0 Vicryl, skin closed with skin staples.  Sterile dressings were applied, the Hemovac injected with 0.25% Marcaine with Epinephrine and clamped.  Patient then had an epidural catheter placed for postoperative pain control.  She was then awakened and taken to the recovery room in stable condition.  Needle and sponge counts correct x 2 at the end of the case. DD:  11/04/99 TD:  11/04/99 Job: [id number]

## 2010-05-24 NOTE — Discharge Summary (Signed)
. [facility]  Patient:    [name], [name]                  MRN: [id number] Adm. Date:  [id number] Disc. Date: [id number] Attending:  Alger Simons T Dictator:   Lavon Paganini. Lander, P.A. CC:         Robert A. Noemi Chapel, M.D.  Meriel Flavors, M.D.  Don Broach. Carlis Abbott, M.D.   Discharge Summary  DISCHARGE DIAGNOSES: 1. Left total knee replacement November 04, 1999. 2. Postoperative anemia. 3. Hypothyroidism. 4. Gout. 5. Angina.  HISTORY OF PRESENT ILLNESS:  [age]-year-old female admitted November 04, 1999, with progressive bilateral knee pain, left greater than right.  No relief with conservative care.  X-rays with advanced degenerative joint disease. Underwent a left total knee replacement November 04, 1999, per Dr. Noemi Chapel. Placed on Coumadin for deep vein thrombosis prophylaxis and weightbearing as tolerated.  Postoperative anemia of 7.4.  No blood products were offered secondary to patient being Jehov[employer]s Witness.  Noted vague chest pain, altered mental status that was questionable November 06, 1999.  Cardiology follow-up, .  Spiral CT of the chest negative for pulmonary emboli.  Telemetry was discontinued November 11, 1999.  Placed on Imdur for suspect angina with workup negative and later discontinued.  Infectious disease consult November 08, 1999, for persistent fever, questionable etiology.  Blood cultures one of two showed Staphylococcus, questionable contaminate.  Placed on empiric Tequin, later discontinued.  Cardiolite study also completed, it was negative.  All antibiotics were later stopped on November 12, 1999, and monitored.  She was minimal assist for ambulation.  She was admitted for comprehensive rehabilitation program.  PAST MEDICAL HISTORY:  See discharge diagnoses.  PAST SURGICAL HISTORY: 1. Bilateral rotator cuff repair. 2. Hysterectomy.  ALLERGIES:  PENICILLIN.  TOBACCO/ALCOHOL:  No tobacco.  Rare  alcohol.  MEDICATIONS PRIOR TO ADMISSION:  Synthroid and Vioxx.  SOCIAL HISTORY:  Lives with husband.  Retired.  Independent with a cane prior to admission.  Split-level home, six steps to entry.  She can stay on the first floor.  Husband works days.  HOSPITAL COURSE:  The patient did well while on rehabilitation services with therapies initiated daily.  The following issues were followed during the patients rehabilitation course.  Pertaining to Ms. Benburys left total knee replacement, remained stable.  Surgical site healing nicely.  Staples have been removed.  No signs of infection.  She was weightbearing as tolerated with a walker.  Her mobility and endurance continued to improve.  She was on Coumadin for deep vein thrombosis prophylaxis.  She completed Coumadin protocol.  There were no bleeding episodes.  Noted postoperative anemia.  She was on Procrit.  Hemoglobin was monitored.  She was now at 10.0 after as low as 7.4.  No blood products were offered as the patient was [employer]. She remained asymptomatic.  No chest pain, no shortness of breath.  There was no fever during her rehabilitation stay.  Blood pressures remained controlled without antihypertensive medications.  She did have a flare-up of gout.  She was placed on colchicine with good results.  She had no bowel or bladder disturbances.  She was discharged home with home health therapies.  Latest laboratories showed an INR of 1.8, hemoglobin 10, hematocrit 30.7. Chemistries unremarkable.  DISCHARGE MEDICATIONS: 1. Synthroid 200 mcg daily. 2. Coumadin, dose to be established at discharge to complete Coumadin    protocol. 3. Niferex 150 mg daily. 4. Protonix 40 mg daily. 5. Colchicine  0.6 mg daily x 2 weeks. 6. Tylox as needed for pain.  ACTIVITY:  Weightbearing as tolerated with walker.  DIET:  Regular.  SPECIAL INSTRUCTIONS:  No driving.  Home health therapies.  FOLLOW-UP:  Dr. Noemi Chapel. DD:  11/27/99 TD:   11/28/99 Job: [id number]

## 2010-05-24 NOTE — Procedures (Signed)
[place]. [facility]  Patient:    [name], [name]                  MRN: [id number] Proc. Date: 11/04/99 Adm. Date:  [id number] Attending:  Clydie Braun                           Procedure Report  DIAGNOSIS:  Degenerative joint disease, knee.  PROCEDURE:  Epidural catheter placement.  INDICATIONS:  Ms. Bown is a 70 year old female who underwent left total knee replacement by Dr. Noemi Chapel today.  The patient and Dr. Noemi Chapel requested epidural catheter placement to be performed on the patient after total knee replacement.  The procedure was discussed in the preoperative period.  The procedure was discussed in detail with the patient concerning risks and benefits of the procedure.  Questions were answered and consent given.  The patient understood that this procedure would be performed following surgery, while still under general anesthesia.  DESCRIPTION OF PROCEDURE:  The patient tolerated the procedure well and was placed in the right lateral decubitus position.  The L3-4 interspace was aseptically prepped and draped with Betadine.  A 17-gauge Tuohy needle was used with ______ technique with preservative-free normal saline.  There was no heme or CSF aspiration.  Test dose was negative with 3 cc of 1.5 % Xylocaine with Epinephrine.  The catheter was placed 3 cm without difficulty. The needle was subsequently withdrawn without difficulty.  The patient was then turned to the supine position, extubated, and taken to the post anesthesia care unit.  The patient will be placed on a Marcaine/fentanyl infusion and will be followed by the anesthesia team. DD:  11/04/99 TD:  11/05/99 Job: [id number]

## 2010-05-30 ENCOUNTER — Ambulatory Visit: Payer: Medicare Other

## 2010-05-31 ENCOUNTER — Ambulatory Visit: Payer: Medicare Other

## 2010-06-06 ENCOUNTER — Encounter: Payer: Medicare Other | Admitting: Physical Therapy

## 2010-06-11 ENCOUNTER — Ambulatory Visit: Payer: Medicare Other | Attending: Family Medicine

## 2010-06-11 DIAGNOSIS — M6281 Muscle weakness (generalized): Secondary | ICD-10-CM | POA: Insufficient documentation

## 2010-06-11 DIAGNOSIS — R5381 Other malaise: Secondary | ICD-10-CM | POA: Insufficient documentation

## 2010-06-11 DIAGNOSIS — M2560 Stiffness of unspecified joint, not elsewhere classified: Secondary | ICD-10-CM | POA: Insufficient documentation

## 2010-06-11 DIAGNOSIS — IMO0001 Reserved for inherently not codable concepts without codable children: Secondary | ICD-10-CM | POA: Insufficient documentation

## 2010-06-12 ENCOUNTER — Ambulatory Visit: Payer: Medicare Other

## 2010-06-17 ENCOUNTER — Ambulatory Visit: Payer: Medicare Other

## 2010-06-19 ENCOUNTER — Ambulatory Visit: Payer: Medicare Other

## 2010-06-21 ENCOUNTER — Ambulatory Visit: Payer: Medicare Other

## 2010-06-26 ENCOUNTER — Ambulatory Visit: Payer: Medicare Other | Admitting: Physical Therapy

## 2010-06-27 ENCOUNTER — Ambulatory Visit: Payer: Medicare Other

## 2010-07-01 ENCOUNTER — Ambulatory Visit: Payer: Medicare Other | Admitting: Physical Therapy

## 2010-07-03 ENCOUNTER — Ambulatory Visit: Payer: Medicare Other | Admitting: Physical Therapy

## 2010-07-08 ENCOUNTER — Ambulatory Visit: Payer: Medicare Other | Attending: Family Medicine | Admitting: Physical Therapy

## 2010-07-08 DIAGNOSIS — IMO0001 Reserved for inherently not codable concepts without codable children: Secondary | ICD-10-CM | POA: Insufficient documentation

## 2010-07-08 DIAGNOSIS — M6281 Muscle weakness (generalized): Secondary | ICD-10-CM | POA: Insufficient documentation

## 2010-07-08 DIAGNOSIS — R5381 Other malaise: Secondary | ICD-10-CM | POA: Insufficient documentation

## 2010-07-08 DIAGNOSIS — M2560 Stiffness of unspecified joint, not elsewhere classified: Secondary | ICD-10-CM | POA: Insufficient documentation

## 2010-07-15 ENCOUNTER — Ambulatory Visit: Payer: Medicare Other | Admitting: Physical Therapy

## 2010-07-16 ENCOUNTER — Encounter (HOSPITAL_COMMUNITY): Payer: Medicare Other

## 2010-07-16 ENCOUNTER — Encounter (HOSPITAL_COMMUNITY): Payer: Medicare Other | Attending: Rheumatology

## 2010-07-16 DIAGNOSIS — M069 Rheumatoid arthritis, unspecified: Secondary | ICD-10-CM | POA: Insufficient documentation

## 2010-07-17 ENCOUNTER — Encounter: Payer: Medicare Other | Admitting: Physical Therapy

## 2010-07-18 ENCOUNTER — Encounter (HOSPITAL_COMMUNITY): Payer: Medicare Other

## 2010-07-22 ENCOUNTER — Ambulatory Visit: Payer: Medicare Other

## 2010-07-26 ENCOUNTER — Ambulatory Visit: Payer: Medicare Other | Admitting: Physical Therapy

## 2010-07-29 ENCOUNTER — Ambulatory Visit: Payer: Medicare Other

## 2010-07-29 ENCOUNTER — Encounter (HOSPITAL_COMMUNITY): Payer: Medicare Other

## 2010-08-02 ENCOUNTER — Ambulatory Visit: Payer: Medicare Other | Admitting: Physical Therapy

## 2010-08-05 ENCOUNTER — Ambulatory Visit: Payer: Medicare Other | Admitting: Physical Therapy

## 2010-08-06 ENCOUNTER — Other Ambulatory Visit: Payer: Self-pay | Admitting: Neurosurgery

## 2010-08-06 DIAGNOSIS — D329 Benign neoplasm of meninges, unspecified: Secondary | ICD-10-CM

## 2010-08-07 ENCOUNTER — Ambulatory Visit: Payer: Medicare Other | Attending: Family Medicine | Admitting: Physical Therapy

## 2010-08-07 DIAGNOSIS — M6281 Muscle weakness (generalized): Secondary | ICD-10-CM | POA: Insufficient documentation

## 2010-08-07 DIAGNOSIS — IMO0001 Reserved for inherently not codable concepts without codable children: Secondary | ICD-10-CM | POA: Insufficient documentation

## 2010-08-07 DIAGNOSIS — R5381 Other malaise: Secondary | ICD-10-CM | POA: Insufficient documentation

## 2010-08-07 DIAGNOSIS — M2560 Stiffness of unspecified joint, not elsewhere classified: Secondary | ICD-10-CM | POA: Insufficient documentation

## 2010-08-09 ENCOUNTER — Encounter: Payer: Medicare Other | Admitting: Physical Therapy

## 2010-08-15 ENCOUNTER — Encounter (HOSPITAL_COMMUNITY): Payer: Medicare Other

## 2010-08-16 ENCOUNTER — Encounter (HOSPITAL_COMMUNITY): Payer: Medicare Other | Attending: Rheumatology

## 2010-08-16 DIAGNOSIS — M069 Rheumatoid arthritis, unspecified: Secondary | ICD-10-CM | POA: Insufficient documentation

## 2010-08-19 ENCOUNTER — Ambulatory Visit
Admission: RE | Admit: 2010-08-19 | Discharge: 2010-08-19 | Disposition: A | Discharge status: Home / Self Care | Payer: Medicare Other | Source: Ambulatory Visit | Attending: Neurosurgery | Admitting: Neurosurgery

## 2010-08-19 DIAGNOSIS — D329 Benign neoplasm of meninges, unspecified: Secondary | ICD-10-CM

## 2010-08-19 MED ORDER — GADOBENATE DIMEGLUMINE 529 MG/ML IV SOLN
15.0000 mL | Freq: Once | INTRAVENOUS | Status: AC | PRN
  Administered 2010-08-19: 15 mL via INTRAVENOUS

## 2010-09-13 ENCOUNTER — Encounter (HOSPITAL_COMMUNITY): Payer: Medicare Other | Attending: Rheumatology

## 2010-09-13 DIAGNOSIS — M069 Rheumatoid arthritis, unspecified: Secondary | ICD-10-CM | POA: Insufficient documentation

## 2010-10-03 LAB — CBC
HCT: 27.9 — ABNORMAL LOW
HCT: 29.8 — ABNORMAL LOW
Hemoglobin: 9.9 — ABNORMAL LOW
MCV: 85
Platelets: 315
Platelets: 339
RBC: 3.28 — ABNORMAL LOW
RBC: 3.5 — ABNORMAL LOW
WBC: 7.6
WBC: 9.2

## 2010-10-03 LAB — COMPREHENSIVE METABOLIC PANEL
AST: 32
Albumin: 3.6
Alkaline Phosphatase: 65
CO2: 25
Chloride: 107
GFR calc Af Amer: >60
Potassium: 3.8
Total Bilirubin: 0.8

## 2010-10-03 LAB — POCT CARDIAC MARKERS
CKMB, poc: 1.4
Myoglobin, poc: 48.8
Myoglobin, poc: 59.1
Operator id: 196461

## 2010-10-03 LAB — CK TOTAL AND CKMB (NOT AT ARMC): Total CK: 170

## 2010-10-03 LAB — DIFFERENTIAL
Eosinophils Absolute: 0.2
Eosinophils Relative: 3
Lymphocytes Relative: 40
Lymphs Abs: 3.7
Monocytes Absolute: 0.4
Monocytes Relative: 5

## 2010-10-03 LAB — POCT I-STAT, CHEM 8
BUN: 16
Creatinine, Ser: 1
Glucose, Bld: 172 — ABNORMAL HIGH
Hemoglobin: 9.9 — ABNORMAL LOW
Sodium: 142
TCO2: 24

## 2010-10-03 LAB — CARDIAC PANEL(CRET KIN+CKTOT+MB+TROPI)
CK, MB: 1
Relative Index: INVALID
Troponin I: <0.01

## 2010-10-03 LAB — TSH: TSH: 1.422 (ref 0.350–4.500)

## 2010-10-08 LAB — BASIC METABOLIC PANEL
Calcium: 10
Chloride: 106
Creatinine, Ser: 1.02
GFR calc Af Amer: >60
Sodium: 139

## 2010-10-08 LAB — CBC
Hemoglobin: 11.4 — ABNORMAL LOW
MCV: 85.6
RBC: 4.07
WBC: 8.8

## 2010-10-08 LAB — NO BLOOD PRODUCTS

## 2010-10-09 LAB — GLUCOSE, CAPILLARY
Glucose-Capillary: 107 — ABNORMAL HIGH
Glucose-Capillary: 110 — ABNORMAL HIGH
Glucose-Capillary: 111 — ABNORMAL HIGH
Glucose-Capillary: 116 — ABNORMAL HIGH
Glucose-Capillary: 117 — ABNORMAL HIGH
Glucose-Capillary: 122 — ABNORMAL HIGH
Glucose-Capillary: 127 — ABNORMAL HIGH
Glucose-Capillary: 127 — ABNORMAL HIGH
Glucose-Capillary: 128 — ABNORMAL HIGH
Glucose-Capillary: 141 — ABNORMAL HIGH
Glucose-Capillary: 142 — ABNORMAL HIGH
Glucose-Capillary: 144 — ABNORMAL HIGH
Glucose-Capillary: 147 — ABNORMAL HIGH
Glucose-Capillary: 150 — ABNORMAL HIGH
Glucose-Capillary: 159 — ABNORMAL HIGH
Glucose-Capillary: 188 — ABNORMAL HIGH
Glucose-Capillary: 191 — ABNORMAL HIGH
Glucose-Capillary: 200 — ABNORMAL HIGH
Glucose-Capillary: 203 — ABNORMAL HIGH
Glucose-Capillary: 88
Glucose-Capillary: 98

## 2010-10-09 LAB — CBC
HCT: 33.3 — ABNORMAL LOW
Hemoglobin: 10.7 — ABNORMAL LOW
MCHC: 32
RBC: 3.84 — ABNORMAL LOW
RDW: 16.8 — ABNORMAL HIGH

## 2010-10-09 LAB — CARDIAC PANEL(CRET KIN+CKTOT+MB+TROPI)
CK, MB: 0.8
Relative Index: INVALID
Relative Index: INVALID
Troponin I: <0.01

## 2010-10-09 LAB — BASIC METABOLIC PANEL
CO2: 26
Chloride: 101
Glucose, Bld: 114 — ABNORMAL HIGH
Potassium: 4.2
Sodium: 137

## 2010-10-11 ENCOUNTER — Encounter (HOSPITAL_COMMUNITY)
Admission: RE | Admit: 2010-10-11 | Discharge: 2010-10-11 | Disposition: A | Discharge status: Home / Self Care | Payer: Medicare Other | Source: Ambulatory Visit | Attending: Rheumatology | Admitting: Rheumatology

## 2010-10-11 DIAGNOSIS — M069 Rheumatoid arthritis, unspecified: Secondary | ICD-10-CM | POA: Insufficient documentation

## 2010-10-11 LAB — DIFFERENTIAL
Basophils Absolute: 0 10*3/uL (ref 0.0–0.1)
Basophils Relative: 0 % (ref 0–1)
Basophils Relative: 1
Eosinophils Absolute: 0.1 10*3/uL (ref 0.0–0.7)
Eosinophils Absolute: 0.3
Eosinophils Relative: 1 % (ref 0–5)
Eosinophils Relative: 4
Lymphocytes Relative: 26 % (ref 12–46)
Lymphs Abs: 2.9 K/uL (ref 0.7–4.0)
Lymphs Abs: 3.3
Monocytes Absolute: 0.1 K/uL (ref 0.1–1.0)
Monocytes Relative: 1 % — ABNORMAL LOW (ref 3–12)
Neutro Abs: 8 10*3/uL — ABNORMAL HIGH (ref 1.7–7.7)
Neutrophils Relative %: 72 % (ref 43–77)

## 2010-10-11 LAB — CBC
HCT: 31.1 % — ABNORMAL LOW (ref 36.0–46.0)
HCT: 32.8 — ABNORMAL LOW
Hemoglobin: 10.3 g/dL — ABNORMAL LOW (ref 12.0–15.0)
MCHC: 33.1 g/dL (ref 30.0–36.0)
MCHC: 32.7
MCV: 83.2 fL (ref 78.0–100.0)
MCV: 87.7
Platelets: 424 K/uL — ABNORMAL HIGH (ref 150–400)
Platelets: 422 — ABNORMAL HIGH
RBC: 3.74 MIL/uL — ABNORMAL LOW (ref 3.87–5.11)
RDW: 16 % — ABNORMAL HIGH (ref 11.5–15.5)
WBC: 11.1 10*3/uL — ABNORMAL HIGH (ref 4.0–10.5)
WBC: 8.1

## 2010-10-11 LAB — I-STAT 8, (EC8 V) (CONVERTED LAB)
Chloride: 109
Glucose, Bld: 164 — ABNORMAL HIGH
Potassium: 4
TCO2: 24
pCO2, Ven: 35.8 — ABNORMAL LOW
pH, Ven: 7.424 — ABNORMAL HIGH

## 2010-10-11 LAB — COMPREHENSIVE METABOLIC PANEL
ALT: 17 U/L (ref 0–35)
Alkaline Phosphatase: 93 U/L (ref 39–117)
BUN: 16 mg/dL (ref 6–23)
CO2: 23 mEq/L (ref 19–32)
GFR calc non Af Amer: >60 mL/min (ref >60)
Glucose, Bld: 160 mg/dL — ABNORMAL HIGH (ref 70–99)
Potassium: 4 mEq/L (ref 3.5–5.1)
Sodium: 135 mEq/L (ref 135–145)
Total Bilirubin: 0.4 mg/dL (ref 0.3–1.2)
Total Protein: 7.2 g/dL (ref 6.0–8.3)

## 2010-10-11 LAB — COMPREHENSIVE METABOLIC PANEL WITH GFR
AST: 15 U/L (ref 0–37)
Albumin: 3.8 g/dL (ref 3.5–5.2)
Calcium: 9.2 mg/dL (ref 8.4–10.5)
Chloride: 103 meq/L (ref 96–112)
Creatinine, Ser: 0.84 mg/dL (ref 0.4–1.2)
GFR calc Af Amer: >60 mL/min (ref >60)

## 2010-10-11 LAB — URINALYSIS, ROUTINE W REFLEX MICROSCOPIC
Bilirubin Urine: NEGATIVE
Glucose, UA: NEGATIVE mg/dL
Hgb urine dipstick: NEGATIVE
Ketones, ur: NEGATIVE mg/dL
Nitrite: NEGATIVE
Protein, ur: NEGATIVE mg/dL
Specific Gravity, Urine: 1.014 (ref 1.005–1.030)
Urobilinogen, UA: 0.2 mg/dL (ref 0.0–1.0)
pH: 6 (ref 5.0–8.0)

## 2010-10-11 LAB — PROTIME-INR: Prothrombin Time: 12.1

## 2010-10-11 LAB — POCT I-STAT CREATININE: Operator id: 285841

## 2010-10-11 LAB — POCT CARDIAC MARKERS
CKMB, poc: <1 ng/mL — ABNORMAL LOW (ref 1.0–8.0)
Myoglobin, poc: 63.5 ng/mL (ref 12–200)

## 2010-10-16 ENCOUNTER — Encounter (HOSPITAL_COMMUNITY): Payer: Medicare Other

## 2010-11-04 ENCOUNTER — Other Ambulatory Visit (HOSPITAL_COMMUNITY): Payer: Self-pay | Admitting: *Deleted

## 2010-11-06 ENCOUNTER — Other Ambulatory Visit (HOSPITAL_COMMUNITY): Payer: Self-pay | Admitting: *Deleted

## 2010-11-13 ENCOUNTER — Encounter (HOSPITAL_COMMUNITY)
Admission: RE | Admit: 2010-11-13 | Discharge: 2010-11-13 | Disposition: A | Discharge status: Home / Self Care | Payer: Medicare Other | Source: Ambulatory Visit | Attending: Rheumatology | Admitting: Rheumatology

## 2010-11-13 DIAGNOSIS — M069 Rheumatoid arthritis, unspecified: Secondary | ICD-10-CM | POA: Insufficient documentation

## 2010-11-13 MED ORDER — SODIUM CHLORIDE 0.9 % IV SOLN
750.0000 mg | INTRAVENOUS | Status: DC
  Filled 2010-11-13: qty 30

## 2010-11-13 MED ORDER — ABATACEPT 250 MG IV SOLR
750.0000 mg | INTRAVENOUS | Status: DC
  Administered 2010-11-13: 750 mg via INTRAVENOUS
  Filled 2010-11-13: qty 30

## 2010-12-11 ENCOUNTER — Encounter (HOSPITAL_COMMUNITY)
Admission: RE | Admit: 2010-12-11 | Discharge: 2010-12-11 | Disposition: A | Discharge status: Home / Self Care | Payer: Medicare Other | Source: Ambulatory Visit | Attending: Rheumatology | Admitting: Rheumatology

## 2010-12-11 DIAGNOSIS — M069 Rheumatoid arthritis, unspecified: Secondary | ICD-10-CM | POA: Insufficient documentation

## 2010-12-11 MED ORDER — SODIUM CHLORIDE 0.9 % IV SOLN
750.0000 mg | INTRAVENOUS | Status: DC
  Administered 2010-12-11: 750 mg via INTRAVENOUS
  Filled 2010-12-11: qty 30

## 2011-01-08 ENCOUNTER — Encounter (HOSPITAL_COMMUNITY)
Admission: RE | Admit: 2011-01-08 | Discharge: 2011-01-08 | Disposition: A | Discharge status: Home / Self Care | Payer: Medicare Other | Source: Ambulatory Visit | Attending: Rheumatology | Admitting: Rheumatology

## 2011-01-08 DIAGNOSIS — M069 Rheumatoid arthritis, unspecified: Secondary | ICD-10-CM | POA: Insufficient documentation

## 2011-01-08 MED ORDER — SODIUM CHLORIDE 0.9 % IV SOLN
750.0000 mg | INTRAVENOUS | Status: AC
  Administered 2011-01-08: 750 mg via INTRAVENOUS
  Filled 2011-01-08: qty 30

## 2011-01-13 DIAGNOSIS — M7500 Adhesive capsulitis of unspecified shoulder: Secondary | ICD-10-CM | POA: Diagnosis not present

## 2011-01-13 DIAGNOSIS — M25669 Stiffness of unspecified knee, not elsewhere classified: Secondary | ICD-10-CM | POA: Diagnosis not present

## 2011-01-13 DIAGNOSIS — M19019 Primary osteoarthritis, unspecified shoulder: Secondary | ICD-10-CM | POA: Diagnosis not present

## 2011-01-15 DIAGNOSIS — M7500 Adhesive capsulitis of unspecified shoulder: Secondary | ICD-10-CM | POA: Diagnosis not present

## 2011-01-15 DIAGNOSIS — M25669 Stiffness of unspecified knee, not elsewhere classified: Secondary | ICD-10-CM | POA: Diagnosis not present

## 2011-01-15 DIAGNOSIS — M19019 Primary osteoarthritis, unspecified shoulder: Secondary | ICD-10-CM | POA: Diagnosis not present

## 2011-01-20 DIAGNOSIS — M19019 Primary osteoarthritis, unspecified shoulder: Secondary | ICD-10-CM | POA: Diagnosis not present

## 2011-01-20 DIAGNOSIS — M25669 Stiffness of unspecified knee, not elsewhere classified: Secondary | ICD-10-CM | POA: Diagnosis not present

## 2011-01-20 DIAGNOSIS — E663 Overweight: Secondary | ICD-10-CM | POA: Diagnosis not present

## 2011-01-20 DIAGNOSIS — E1149 Type 2 diabetes mellitus with other diabetic neurological complication: Secondary | ICD-10-CM | POA: Diagnosis not present

## 2011-01-20 DIAGNOSIS — E039 Hypothyroidism, unspecified: Secondary | ICD-10-CM | POA: Diagnosis not present

## 2011-01-20 DIAGNOSIS — M7500 Adhesive capsulitis of unspecified shoulder: Secondary | ICD-10-CM | POA: Diagnosis not present

## 2011-01-20 DIAGNOSIS — E1142 Type 2 diabetes mellitus with diabetic polyneuropathy: Secondary | ICD-10-CM | POA: Diagnosis not present

## 2011-01-22 DIAGNOSIS — M7500 Adhesive capsulitis of unspecified shoulder: Secondary | ICD-10-CM | POA: Diagnosis not present

## 2011-01-22 DIAGNOSIS — M25669 Stiffness of unspecified knee, not elsewhere classified: Secondary | ICD-10-CM | POA: Diagnosis not present

## 2011-01-22 DIAGNOSIS — M19019 Primary osteoarthritis, unspecified shoulder: Secondary | ICD-10-CM | POA: Diagnosis not present

## 2011-01-28 DIAGNOSIS — E119 Type 2 diabetes mellitus without complications: Secondary | ICD-10-CM | POA: Diagnosis not present

## 2011-01-28 DIAGNOSIS — E039 Hypothyroidism, unspecified: Secondary | ICD-10-CM | POA: Diagnosis not present

## 2011-01-28 DIAGNOSIS — M5137 Other intervertebral disc degeneration, lumbosacral region: Secondary | ICD-10-CM | POA: Diagnosis not present

## 2011-01-28 DIAGNOSIS — M545 Low back pain: Secondary | ICD-10-CM | POA: Diagnosis not present

## 2011-01-28 DIAGNOSIS — J069 Acute upper respiratory infection, unspecified: Secondary | ICD-10-CM | POA: Diagnosis not present

## 2011-02-05 ENCOUNTER — Encounter (HOSPITAL_COMMUNITY)
Admission: RE | Admit: 2011-02-05 | Discharge: 2011-02-05 | Disposition: A | Discharge status: Home / Self Care | Payer: Medicare Other | Source: Ambulatory Visit | Attending: Rheumatology | Admitting: Rheumatology

## 2011-02-05 MED ORDER — SODIUM CHLORIDE 0.9 % IV SOLN
750.0000 mg | INTRAVENOUS | Status: DC
  Administered 2011-02-05: 750 mg via INTRAVENOUS
  Filled 2011-02-05: qty 30

## 2011-02-07 DIAGNOSIS — R0989 Other specified symptoms and signs involving the circulatory and respiratory systems: Secondary | ICD-10-CM | POA: Diagnosis not present

## 2011-03-03 ENCOUNTER — Other Ambulatory Visit (HOSPITAL_COMMUNITY): Payer: Self-pay | Admitting: *Deleted

## 2011-03-03 DIAGNOSIS — Z01419 Encounter for gynecological examination (general) (routine) without abnormal findings: Secondary | ICD-10-CM | POA: Diagnosis not present

## 2011-03-03 DIAGNOSIS — N3946 Mixed incontinence: Secondary | ICD-10-CM | POA: Diagnosis not present

## 2011-03-05 ENCOUNTER — Encounter (HOSPITAL_COMMUNITY)
Admission: RE | Admit: 2011-03-05 | Discharge: 2011-03-05 | Disposition: A | Discharge status: Home / Self Care | Payer: Medicare Other | Source: Ambulatory Visit | Attending: Rheumatology | Admitting: Rheumatology

## 2011-03-05 DIAGNOSIS — M069 Rheumatoid arthritis, unspecified: Secondary | ICD-10-CM | POA: Diagnosis not present

## 2011-03-05 MED ORDER — SODIUM CHLORIDE 0.9 % IV SOLN
750.0000 mg | INTRAVENOUS | Status: DC
  Administered 2011-03-05: 750 mg via INTRAVENOUS
  Filled 2011-03-05: qty 30

## 2011-03-17 DIAGNOSIS — Z79899 Other long term (current) drug therapy: Secondary | ICD-10-CM | POA: Diagnosis not present

## 2011-03-17 DIAGNOSIS — D649 Anemia, unspecified: Secondary | ICD-10-CM | POA: Diagnosis not present

## 2011-03-17 DIAGNOSIS — Z0000 Encounter for general adult medical examination without abnormal findings: Secondary | ICD-10-CM | POA: Diagnosis not present

## 2011-03-17 DIAGNOSIS — Z23 Encounter for immunization: Secondary | ICD-10-CM | POA: Diagnosis not present

## 2011-03-24 DIAGNOSIS — R32 Unspecified urinary incontinence: Secondary | ICD-10-CM | POA: Diagnosis not present

## 2011-03-31 ENCOUNTER — Encounter (HOSPITAL_COMMUNITY)
Admission: RE | Admit: 2011-03-31 | Discharge: 2011-03-31 | Disposition: A | Discharge status: Home / Self Care | Payer: Medicare Other | Source: Ambulatory Visit | Attending: Rheumatology | Admitting: Rheumatology

## 2011-03-31 DIAGNOSIS — M069 Rheumatoid arthritis, unspecified: Secondary | ICD-10-CM | POA: Diagnosis not present

## 2011-03-31 MED ORDER — SODIUM CHLORIDE 0.9 % IV SOLN
INTRAVENOUS | Status: DC
  Administered 2011-03-31: 15:00:00 via INTRAVENOUS

## 2011-03-31 MED ORDER — SODIUM CHLORIDE 0.9 % IV SOLN
750.0000 mg | INTRAVENOUS | Status: DC
  Administered 2011-03-31: 750 mg via INTRAVENOUS
  Filled 2011-03-31: qty 30

## 2011-04-02 ENCOUNTER — Encounter (HOSPITAL_COMMUNITY): Payer: Medicare Other

## 2011-04-09 ENCOUNTER — Encounter (HOSPITAL_COMMUNITY): Payer: Medicare Other

## 2011-04-09 DIAGNOSIS — M79609 Pain in unspecified limb: Secondary | ICD-10-CM | POA: Diagnosis not present

## 2011-04-09 DIAGNOSIS — B351 Tinea unguium: Secondary | ICD-10-CM | POA: Diagnosis not present

## 2011-04-14 DIAGNOSIS — E1142 Type 2 diabetes mellitus with diabetic polyneuropathy: Secondary | ICD-10-CM | POA: Diagnosis not present

## 2011-04-14 DIAGNOSIS — E663 Overweight: Secondary | ICD-10-CM | POA: Diagnosis not present

## 2011-04-14 DIAGNOSIS — D649 Anemia, unspecified: Secondary | ICD-10-CM | POA: Diagnosis not present

## 2011-04-14 DIAGNOSIS — E785 Hyperlipidemia, unspecified: Secondary | ICD-10-CM | POA: Diagnosis not present

## 2011-04-14 DIAGNOSIS — E039 Hypothyroidism, unspecified: Secondary | ICD-10-CM | POA: Diagnosis not present

## 2011-04-14 DIAGNOSIS — E1149 Type 2 diabetes mellitus with other diabetic neurological complication: Secondary | ICD-10-CM | POA: Diagnosis not present

## 2011-04-14 DIAGNOSIS — Z79899 Other long term (current) drug therapy: Secondary | ICD-10-CM | POA: Diagnosis not present

## 2011-04-15 DIAGNOSIS — Z23 Encounter for immunization: Secondary | ICD-10-CM | POA: Diagnosis not present

## 2011-04-30 ENCOUNTER — Encounter (HOSPITAL_COMMUNITY)
Admission: RE | Admit: 2011-04-30 | Discharge: 2011-04-30 | Disposition: A | Discharge status: Home / Self Care | Payer: Medicare Other | Source: Ambulatory Visit | Attending: Rheumatology | Admitting: Rheumatology

## 2011-04-30 DIAGNOSIS — M069 Rheumatoid arthritis, unspecified: Secondary | ICD-10-CM | POA: Diagnosis not present

## 2011-04-30 MED ORDER — SODIUM CHLORIDE 0.9 % IV SOLN
750.0000 mg | INTRAVENOUS | Status: AC
  Administered 2011-04-30: 750 mg via INTRAVENOUS
  Filled 2011-04-30 (×2): qty 30

## 2011-04-30 MED ORDER — SODIUM CHLORIDE 0.9 % IV SOLN
INTRAVENOUS | Status: AC
  Administered 2011-04-30: 16:00:00 via INTRAVENOUS

## 2011-05-12 NOTE — Anesthesia Preprocedure Evaluation (Addendum)
Anesthesia Evaluation  Patient identified by MRN, date of birth, ID band Patient awake    Reviewed: Allergy & Precautions, H&P , NPO status , Patient's Chart, lab work & pertinent test results  Airway Mallampati: II TM Distance: >3 FB Neck ROM: full    Dental  (+) Dental Advisory Given, Poor Dentition, Caps and Missing,    Pulmonary neg pulmonary ROS,  breath sounds clear to auscultation  Pulmonary exam normal       Cardiovascular hypertension, Pt. on medications Rhythm:regular Rate:Normal  Diastolic dysfunction   Neuro/Psych ICA stenosis 20-39%. Hx brain tumor resection negative neurological ROS  negative psych ROS   GI/Hepatic negative GI ROS, Neg liver ROS,   Endo/Other  negative endocrine ROSDiabetes mellitus-, Well Controlled, Type 2, Oral Hypoglycemic AgentsHypothyroidism   Renal/GU negative Renal ROS  negative genitourinary   Musculoskeletal  (+) Arthritis -, Rheumatoid disorders,    Abdominal   Peds  Hematology negative hematology ROS (+) Sickle cell trait   Anesthesia Other Findings   Reproductive/Obstetrics negative OB ROS                        Anesthesia Physical Anesthesia Plan  ASA: III  Anesthesia Plan: MAC   Post-op Pain Management:    Induction:   Airway Management Planned: Simple Face Mask  Additional Equipment:   Intra-op Plan:   Post-operative Plan:   Informed Consent: I have reviewed the patients History and Physical, chart, labs and discussed the procedure including the risks, benefits and alternatives for the proposed anesthesia with the patient or authorized representative who has indicated his/her understanding and acceptance.   Dental Advisory Given  Plan Discussed with: CRNA and Surgeon  Anesthesia Plan Comments:        Anesthesia Quick Evaluation

## 2011-05-13 ENCOUNTER — Ambulatory Visit (HOSPITAL_COMMUNITY)
Admission: RE | Admit: 2011-05-13 | Discharge: 2011-05-13 | Disposition: A | Discharge status: Home / Self Care | Payer: Medicare Other | Source: Ambulatory Visit | Attending: Gastroenterology | Admitting: Gastroenterology

## 2011-05-13 ENCOUNTER — Encounter (HOSPITAL_COMMUNITY)
Admission: RE | Disposition: A | Discharge status: Home / Self Care | Payer: Self-pay | Source: Ambulatory Visit | Attending: Gastroenterology

## 2011-05-13 ENCOUNTER — Ambulatory Visit (HOSPITAL_COMMUNITY): Payer: Medicare Other | Admitting: Anesthesiology

## 2011-05-13 ENCOUNTER — Encounter (HOSPITAL_COMMUNITY): Payer: Self-pay | Admitting: Gastroenterology

## 2011-05-13 ENCOUNTER — Encounter (HOSPITAL_COMMUNITY): Payer: Self-pay | Admitting: Anesthesiology

## 2011-05-13 DIAGNOSIS — K648 Other hemorrhoids: Secondary | ICD-10-CM | POA: Diagnosis not present

## 2011-05-13 DIAGNOSIS — K5730 Diverticulosis of large intestine without perforation or abscess without bleeding: Secondary | ICD-10-CM | POA: Diagnosis not present

## 2011-05-13 DIAGNOSIS — Z1211 Encounter for screening for malignant neoplasm of colon: Secondary | ICD-10-CM | POA: Diagnosis not present

## 2011-05-13 HISTORY — DX: Essential (primary) hypertension: I10

## 2011-05-13 HISTORY — DX: Nonspecific reaction to tuberculin skin test without active tuberculosis: R76.11

## 2011-05-13 HISTORY — DX: Chronic kidney disease, stage 3 (moderate): N18.3

## 2011-05-13 HISTORY — DX: Other chronic pain: G89.29

## 2011-05-13 HISTORY — DX: Chronic kidney disease, stage 3 unspecified: N18.30

## 2011-05-13 HISTORY — DX: Rheumatoid arthritis, unspecified: M06.9

## 2011-05-13 HISTORY — DX: Unspecified osteoarthritis, unspecified site: M19.90

## 2011-05-13 HISTORY — DX: Pure hypercholesterolemia, unspecified: E78.00

## 2011-05-13 HISTORY — DX: Hypothyroidism, unspecified: E03.9

## 2011-05-13 HISTORY — DX: Anemia, unspecified: D64.9

## 2011-05-13 HISTORY — DX: Sickle-cell trait: D57.3

## 2011-05-13 HISTORY — DX: Polyneuropathy, unspecified: G62.9

## 2011-05-13 HISTORY — DX: Diverticulosis of intestine, part unspecified, without perforation or abscess without bleeding: K57.90

## 2011-05-13 HISTORY — DX: Benign neoplasm of meninges, unspecified: D32.9

## 2011-05-13 LAB — GLUCOSE, CAPILLARY: Glucose-Capillary: 122 mg/dL — ABNORMAL HIGH (ref 70–99)

## 2011-05-13 SURGERY — COLONOSCOPY WITH PROPOFOL
Anesthesia: Monitor Anesthesia Care

## 2011-05-13 MED ORDER — LACTATED RINGERS IV SOLN
INTRAVENOUS | Status: DC
  Administered 2011-05-13: 1000 mL via INTRAVENOUS

## 2011-05-13 MED ORDER — PROPOFOL 10 MG/ML IV EMUL
INTRAVENOUS | Status: DC | PRN
  Administered 2011-05-13: 200 ug/kg/min via INTRAVENOUS

## 2011-05-13 MED ORDER — FENTANYL CITRATE 0.05 MG/ML IJ SOLN
INTRAMUSCULAR | Status: DC | PRN
  Administered 2011-05-13 (×2): 50 ug via INTRAVENOUS

## 2011-05-13 MED ORDER — LACTATED RINGERS IV SOLN
INTRAVENOUS | Status: DC | PRN
  Administered 2011-05-13: 09:00:00 via INTRAVENOUS

## 2011-05-13 MED ORDER — LIDOCAINE HCL (CARDIAC) 20 MG/ML IV SOLN
INTRAVENOUS | Status: DC | PRN
  Administered 2011-05-13: 30 mg via INTRAVENOUS

## 2011-05-13 MED ORDER — GLYCOPYRROLATE 0.2 MG/ML IJ SOLN
INTRAMUSCULAR | Status: DC | PRN
  Administered 2011-05-13 (×2): .05 mg via INTRAVENOUS

## 2011-05-13 MED ORDER — KETAMINE HCL 50 MG/ML IJ SOLN
INTRAMUSCULAR | Status: DC | PRN
  Administered 2011-05-13: 10 mg via INTRAMUSCULAR
  Administered 2011-05-13 (×2): 5 mg via INTRAMUSCULAR
  Administered 2011-05-13: 10 mg via INTRAMUSCULAR

## 2011-05-13 MED ORDER — FENTANYL CITRATE 0.05 MG/ML IJ SOLN: 25.0000 ug | INTRAMUSCULAR | Status: DC | PRN

## 2011-05-13 MED ORDER — ONDANSETRON HCL 4 MG/2ML IJ SOLN
INTRAMUSCULAR | Status: DC | PRN
  Administered 2011-05-13: 4 mg via INTRAVENOUS

## 2011-05-13 SURGICAL SUPPLY — 21 items

## 2011-05-13 NOTE — Brief Op Note (Signed)
See endopro note

## 2011-05-13 NOTE — Interval H&P Note (Signed)
History and Physical Interval Note:  05/13/2011 8:47 AM  [name]  has presented today for surgery, with the diagnosis of screening  The various methods of treatment have been discussed with the patient and family. After consideration of risks, benefits and other options for treatment, the patient has consented to  Procedure(s) (LRB): COLONOSCOPY WITH PROPOFOL (N/A) as a surgical intervention .  The patients' history has been reviewed, patient examined, no change in status, stable for surgery.  I have reviewed the patients' chart and labs.  Questions were answered to the patient's satisfaction.     [place].

## 2011-05-13 NOTE — Discharge Instructions (Addendum)
Repeat colonoscopy in 10 years. High fiber diet as tolerated. Follow up with me as needed.Colonoscopy Care After These instructions give you information on caring for yourself after your procedure. Your doctor may also give you more specific instructions. Call your doctor if you have any problems or questions after your procedure. HOME CARE  Take it easy for the next 24 hours.   Rest.   Walk or use warm packs on your belly (abdomen) if you have belly cramping or gas.   Do not drive for 24 hours.   You may shower.   Do not sign important papers or use machinery for 24 hours.   Drink enough fluids to keep your pee (urine) clear or pale yellow.   Resume your normal diet. Avoid heavy or fried foods.   Avoid alcohol.   Continue taking your normal medicines.   Only take medicine as told by your doctor. Do not take aspirin.  If you had growths (polyps) removed:  Do not take aspirin.   Do not drink alcohol for 7 days or as told by your doctor.   Eat a soft diet for 24 hours.  GET HELP RIGHT AWAY IF:  You have a fever.   You pass clumps of tissue (blood clots) or fill the toilet with blood.   You have belly pain that gets worse and medicine does not help.   Your belly is puffy (swollen).   You feel sick to your stomach (nauseous) or throw up (vomit).  MAKE SURE YOU:  Understand these instructions.   Will watch your condition.   Will get help right away if you are not doing well or get worse.  Document Released: 01/25/2010 Document Revised: 12/12/2010 Document Reviewed: 01/25/2010 [facility] Patient Information 2012 [employer].

## 2011-05-13 NOTE — H&P (Signed)
  Date of Initial H&P: 04/24/11  History reviewed, patient examined, no change in status, stable for surgery.

## 2011-05-13 NOTE — Addendum Note (Signed)
Addendum  created 05/13/11 1127 by Peyton Najjar, MD   Modules edited:Anesthesia Responsible Staff

## 2011-05-13 NOTE — Anesthesia Postprocedure Evaluation (Signed)
  Anesthesia Post-op Note  Patient: [name]  Procedure(s) Performed: Procedure(s) (LRB): COLONOSCOPY WITH PROPOFOL (N/A)  Patient Location: PACU  Anesthesia Type: MAC  Level of Consciousness: awake and alert   Airway and Oxygen Therapy: Patient Spontanous Breathing  Post-op Pain: mild  Post-op Assessment: Post-op Vital signs reviewed, Patient's Cardiovascular Status Stable, Respiratory Function Stable, Patent Airway and No signs of Nausea or vomiting  Post-op Vital Signs: stable  Complications: No apparent anesthesia complications

## 2011-05-13 NOTE — Transfer of Care (Signed)
Immediate Anesthesia Transfer of Care Note  Patient: [name]  Procedure(s) Performed: Procedure(s) (LRB): COLONOSCOPY WITH PROPOFOL (N/A)  Patient Location: Endoscopy Unit  Anesthesia Type: MAC  Level of Consciousness: awake, alert , oriented, patient cooperative and responds to stimulation  Airway & Oxygen Therapy: nasal cannula  Post-op Assessment: Report given to PACU RN, Post -op Vital signs reviewed and stable and Patient moving all extremities  Post vital signs: Reviewed and stable  Complications: No apparent anesthesia complications

## 2011-05-13 NOTE — Op Note (Signed)
[facility] [place], [place]  [address]  COLONOSCOPY PROCEDURE REPORT  PATIENT:  [name], [name]  MR#:  [id number] BIRTHDATE:  01/19/1940, 71 yrs. old  GENDER:  female ENDOSCOPIST:  Wilford Corner, MD REF. BY:  Jonathon Jordan, M.D. PROCEDURE DATE:  05/13/2011 PROCEDURE:  Colonoscopy [id number] ASA CLASS:  Class III INDICATIONS:  Screening MEDICATIONS:   See Anesthesia Report.  DESCRIPTION OF PROCEDURE:   After the risks benefits and alternatives of the procedure were thoroughly explained, informed consent was obtained.  The Pentax Ped Colon [id number] endoscope was introduced through the anus and advanced to the cecum, which was identified by both the appendix and ileocecal valve, without limitations.  The quality of the prep was good.  The instrument was then slowly withdrawn as the colon was fully examined. <<PROCEDUREIMAGES>>  FINDINGS:  Rectal exam unremarkable.  Pediatric colonoscope inserted into the colon and advanced to the cecum, where the appendiceal orifice and ileocecal valve were identified.    The terminal ileum was intubated and was normal in appearance.  On careful withdrawal of the colonoscope  scattered diverticula were seen in the ascending and descending colon. The colocolonic anastomosis was noted in the area of the rectosigmoid colon. Retroflexion revealed small internal hemorrhoids.  COMPLICATIONS:  None  IMPRESSION:     1. Scattered colonic diverticulosis 2. Small internal hemorrhoids 3. Normal appearing colocolonic anastomosis  RECOMMENDATIONS:  1. High fiber diet as tolerated 2. Repeat colnoscopy in 10 years  ______________________________ Wilford Corner, MD  CC:  Jonathon Jordan, MD  n. Lorrin MaisWilford Corner at 05/13/2011 09:24 AM  Trilby Leaver, [id number]

## 2011-05-13 NOTE — Preoperative (Signed)
Beta Blockers   Reason not to administer Beta Blockers:Not Applicable 

## 2011-05-13 NOTE — Addendum Note (Signed)
Addendum  created 05/13/11 1128 by Peyton Najjar, MD   Modules edited:Anesthesia Responsible Staff

## 2011-05-19 DIAGNOSIS — IMO0001 Reserved for inherently not codable concepts without codable children: Secondary | ICD-10-CM | POA: Diagnosis not present

## 2011-05-21 DIAGNOSIS — M545 Low back pain: Secondary | ICD-10-CM | POA: Diagnosis not present

## 2011-05-21 DIAGNOSIS — M069 Rheumatoid arthritis, unspecified: Secondary | ICD-10-CM | POA: Diagnosis not present

## 2011-05-21 DIAGNOSIS — M719 Bursopathy, unspecified: Secondary | ICD-10-CM | POA: Diagnosis not present

## 2011-05-21 DIAGNOSIS — Z111 Encounter for screening for respiratory tuberculosis: Secondary | ICD-10-CM | POA: Diagnosis not present

## 2011-05-21 DIAGNOSIS — M25569 Pain in unspecified knee: Secondary | ICD-10-CM | POA: Diagnosis not present

## 2011-05-22 DIAGNOSIS — R32 Unspecified urinary incontinence: Secondary | ICD-10-CM | POA: Diagnosis not present

## 2011-05-25 DIAGNOSIS — IMO0002 Reserved for concepts with insufficient information to code with codable children: Secondary | ICD-10-CM | POA: Insufficient documentation

## 2011-05-26 ENCOUNTER — Other Ambulatory Visit (HOSPITAL_COMMUNITY): Payer: Self-pay | Admitting: *Deleted

## 2011-05-26 DIAGNOSIS — Z111 Encounter for screening for respiratory tuberculosis: Secondary | ICD-10-CM | POA: Diagnosis not present

## 2011-05-26 DIAGNOSIS — H40019 Open angle with borderline findings, low risk, unspecified eye: Secondary | ICD-10-CM | POA: Diagnosis not present

## 2011-05-26 DIAGNOSIS — H2510 Age-related nuclear cataract, unspecified eye: Secondary | ICD-10-CM | POA: Diagnosis not present

## 2011-05-28 ENCOUNTER — Encounter (HOSPITAL_COMMUNITY): Payer: Medicare Other

## 2011-05-30 ENCOUNTER — Encounter (HOSPITAL_COMMUNITY)
Admission: RE | Admit: 2011-05-30 | Discharge: 2011-05-30 | Disposition: A | Discharge status: Home / Self Care | Payer: Medicare Other | Source: Ambulatory Visit | Attending: Rheumatology | Admitting: Rheumatology

## 2011-05-30 DIAGNOSIS — M069 Rheumatoid arthritis, unspecified: Secondary | ICD-10-CM | POA: Insufficient documentation

## 2011-05-30 MED ORDER — SODIUM CHLORIDE 0.9 % IV SOLN
Freq: Once | INTRAVENOUS | Status: AC
  Administered 2011-05-30: 12:00:00 via INTRAVENOUS

## 2011-05-30 MED ORDER — SODIUM CHLORIDE 0.9 % IV SOLN
750.0000 mg | INTRAVENOUS | Status: DC
  Administered 2011-05-30: 750 mg via INTRAVENOUS
  Filled 2011-05-30: qty 30

## 2011-06-06 ENCOUNTER — Other Ambulatory Visit (HOSPITAL_COMMUNITY): Payer: Self-pay | Admitting: Rheumatology

## 2011-06-06 ENCOUNTER — Ambulatory Visit (HOSPITAL_COMMUNITY)
Admission: RE | Admit: 2011-06-06 | Discharge: 2011-06-06 | Disposition: A | Discharge status: Home / Self Care | Payer: Medicare Other | Source: Ambulatory Visit | Attending: Rheumatology | Admitting: Rheumatology

## 2011-06-06 DIAGNOSIS — A150 Tuberculosis of lung: Secondary | ICD-10-CM

## 2011-06-06 DIAGNOSIS — I10 Essential (primary) hypertension: Secondary | ICD-10-CM | POA: Diagnosis not present

## 2011-06-06 DIAGNOSIS — J45909 Unspecified asthma, uncomplicated: Secondary | ICD-10-CM | POA: Diagnosis not present

## 2011-06-06 DIAGNOSIS — Z79899 Other long term (current) drug therapy: Secondary | ICD-10-CM | POA: Diagnosis not present

## 2011-06-06 DIAGNOSIS — R7611 Nonspecific reaction to tuberculin skin test without active tuberculosis: Secondary | ICD-10-CM | POA: Diagnosis not present

## 2011-06-20 DIAGNOSIS — R32 Unspecified urinary incontinence: Secondary | ICD-10-CM | POA: Diagnosis not present

## 2011-06-20 DIAGNOSIS — J301 Allergic rhinitis due to pollen: Secondary | ICD-10-CM | POA: Diagnosis not present

## 2011-06-20 DIAGNOSIS — N390 Urinary tract infection, site not specified: Secondary | ICD-10-CM | POA: Diagnosis not present

## 2011-06-20 DIAGNOSIS — I8310 Varicose veins of unspecified lower extremity with inflammation: Secondary | ICD-10-CM | POA: Diagnosis not present

## 2011-06-25 ENCOUNTER — Encounter (HOSPITAL_COMMUNITY): Payer: Medicare Other

## 2011-07-02 DIAGNOSIS — M069 Rheumatoid arthritis, unspecified: Secondary | ICD-10-CM | POA: Diagnosis not present

## 2011-07-08 DIAGNOSIS — M79609 Pain in unspecified limb: Secondary | ICD-10-CM | POA: Diagnosis not present

## 2011-07-08 DIAGNOSIS — B351 Tinea unguium: Secondary | ICD-10-CM | POA: Diagnosis not present

## 2011-07-08 DIAGNOSIS — L84 Corns and callosities: Secondary | ICD-10-CM | POA: Diagnosis not present

## 2011-07-14 DIAGNOSIS — E1149 Type 2 diabetes mellitus with other diabetic neurological complication: Secondary | ICD-10-CM | POA: Diagnosis not present

## 2011-07-14 DIAGNOSIS — E039 Hypothyroidism, unspecified: Secondary | ICD-10-CM | POA: Diagnosis not present

## 2011-07-14 DIAGNOSIS — E663 Overweight: Secondary | ICD-10-CM | POA: Diagnosis not present

## 2011-07-14 DIAGNOSIS — E785 Hyperlipidemia, unspecified: Secondary | ICD-10-CM | POA: Diagnosis not present

## 2011-07-14 DIAGNOSIS — E1142 Type 2 diabetes mellitus with diabetic polyneuropathy: Secondary | ICD-10-CM | POA: Diagnosis not present

## 2011-07-16 DIAGNOSIS — M069 Rheumatoid arthritis, unspecified: Secondary | ICD-10-CM | POA: Diagnosis not present

## 2011-07-23 ENCOUNTER — Encounter (HOSPITAL_COMMUNITY): Payer: Medicare Other

## 2011-08-04 DIAGNOSIS — H00019 Hordeolum externum unspecified eye, unspecified eyelid: Secondary | ICD-10-CM | POA: Diagnosis not present

## 2011-08-04 DIAGNOSIS — E7800 Pure hypercholesterolemia, unspecified: Secondary | ICD-10-CM | POA: Diagnosis not present

## 2011-08-07 DIAGNOSIS — M5137 Other intervertebral disc degeneration, lumbosacral region: Secondary | ICD-10-CM | POA: Diagnosis not present

## 2011-08-07 DIAGNOSIS — M069 Rheumatoid arthritis, unspecified: Secondary | ICD-10-CM | POA: Diagnosis not present

## 2011-08-07 DIAGNOSIS — M76899 Other specified enthesopathies of unspecified lower limb, excluding foot: Secondary | ICD-10-CM | POA: Diagnosis not present

## 2011-08-07 DIAGNOSIS — M1710 Unilateral primary osteoarthritis, unspecified knee: Secondary | ICD-10-CM | POA: Diagnosis not present

## 2011-08-07 DIAGNOSIS — IMO0002 Reserved for concepts with insufficient information to code with codable children: Secondary | ICD-10-CM | POA: Diagnosis not present

## 2011-08-07 DIAGNOSIS — M533 Sacrococcygeal disorders, not elsewhere classified: Secondary | ICD-10-CM | POA: Diagnosis not present

## 2011-09-09 DIAGNOSIS — M1710 Unilateral primary osteoarthritis, unspecified knee: Secondary | ICD-10-CM | POA: Diagnosis not present

## 2011-09-09 DIAGNOSIS — IMO0002 Reserved for concepts with insufficient information to code with codable children: Secondary | ICD-10-CM | POA: Diagnosis not present

## 2011-09-13 DIAGNOSIS — M5137 Other intervertebral disc degeneration, lumbosacral region: Secondary | ICD-10-CM | POA: Diagnosis not present

## 2011-09-16 DIAGNOSIS — M25539 Pain in unspecified wrist: Secondary | ICD-10-CM | POA: Diagnosis not present

## 2011-09-16 DIAGNOSIS — M545 Low back pain: Secondary | ICD-10-CM | POA: Diagnosis not present

## 2011-09-16 DIAGNOSIS — M069 Rheumatoid arthritis, unspecified: Secondary | ICD-10-CM | POA: Diagnosis not present

## 2011-09-17 DIAGNOSIS — M545 Low back pain: Secondary | ICD-10-CM | POA: Diagnosis not present

## 2011-09-19 DIAGNOSIS — M5137 Other intervertebral disc degeneration, lumbosacral region: Secondary | ICD-10-CM | POA: Diagnosis not present

## 2011-09-22 DIAGNOSIS — IMO0001 Reserved for inherently not codable concepts without codable children: Secondary | ICD-10-CM | POA: Diagnosis not present

## 2011-09-22 DIAGNOSIS — Z23 Encounter for immunization: Secondary | ICD-10-CM | POA: Diagnosis not present

## 2011-09-22 DIAGNOSIS — Z6836 Body mass index (BMI) 36.0-36.9, adult: Secondary | ICD-10-CM | POA: Diagnosis not present

## 2011-10-09 DIAGNOSIS — Z79899 Other long term (current) drug therapy: Secondary | ICD-10-CM | POA: Diagnosis not present

## 2011-10-15 DIAGNOSIS — M25569 Pain in unspecified knee: Secondary | ICD-10-CM | POA: Diagnosis not present

## 2011-10-15 DIAGNOSIS — M25549 Pain in joints of unspecified hand: Secondary | ICD-10-CM | POA: Diagnosis not present

## 2011-10-15 DIAGNOSIS — M069 Rheumatoid arthritis, unspecified: Secondary | ICD-10-CM | POA: Diagnosis not present

## 2011-10-20 ENCOUNTER — Other Ambulatory Visit (HOSPITAL_COMMUNITY): Payer: Self-pay | Admitting: *Deleted

## 2011-10-20 DIAGNOSIS — E1149 Type 2 diabetes mellitus with other diabetic neurological complication: Secondary | ICD-10-CM | POA: Diagnosis not present

## 2011-10-20 DIAGNOSIS — E663 Overweight: Secondary | ICD-10-CM | POA: Diagnosis not present

## 2011-10-20 DIAGNOSIS — E785 Hyperlipidemia, unspecified: Secondary | ICD-10-CM | POA: Diagnosis not present

## 2011-10-20 DIAGNOSIS — E039 Hypothyroidism, unspecified: Secondary | ICD-10-CM | POA: Diagnosis not present

## 2011-10-21 ENCOUNTER — Encounter (HOSPITAL_COMMUNITY)
Admission: RE | Admit: 2011-10-21 | Discharge: 2011-10-21 | Disposition: A | Discharge status: Home / Self Care | Payer: Medicare Other | Source: Ambulatory Visit | Attending: Rheumatology | Admitting: Rheumatology

## 2011-10-21 DIAGNOSIS — M069 Rheumatoid arthritis, unspecified: Secondary | ICD-10-CM | POA: Diagnosis not present

## 2011-10-21 MED ORDER — LORATADINE 10 MG PO TABS
10.0000 mg | ORAL_TABLET | ORAL | Status: DC
Start: 2011-10-21 — End: 2011-10-22

## 2011-10-21 MED ORDER — SODIUM CHLORIDE 0.9 % IV SOLN
INTRAVENOUS | Status: DC
Start: 2011-10-21 — End: 2011-10-22
  Administered 2011-10-21: 250 mL via INTRAVENOUS

## 2011-10-21 MED ORDER — SODIUM CHLORIDE 0.9 % IV SOLN
3.0000 mg/kg | INTRAVENOUS | Status: DC
  Administered 2011-10-21: 300 mg via INTRAVENOUS
  Filled 2011-10-21: qty 30

## 2011-10-21 MED ORDER — ACETAMINOPHEN 325 MG PO TABS: 650.0000 mg | ORAL_TABLET | ORAL | Status: DC

## 2011-10-23 DIAGNOSIS — E1149 Type 2 diabetes mellitus with other diabetic neurological complication: Secondary | ICD-10-CM | POA: Diagnosis not present

## 2011-10-23 DIAGNOSIS — L608 Other nail disorders: Secondary | ICD-10-CM | POA: Diagnosis not present

## 2011-10-23 DIAGNOSIS — Q828 Other specified congenital malformations of skin: Secondary | ICD-10-CM | POA: Diagnosis not present

## 2011-10-27 DIAGNOSIS — H40019 Open angle with borderline findings, low risk, unspecified eye: Secondary | ICD-10-CM | POA: Diagnosis not present

## 2011-11-03 DIAGNOSIS — R198 Other specified symptoms and signs involving the digestive system and abdomen: Secondary | ICD-10-CM | POA: Diagnosis not present

## 2011-11-03 DIAGNOSIS — R197 Diarrhea, unspecified: Secondary | ICD-10-CM | POA: Diagnosis not present

## 2011-11-04 ENCOUNTER — Encounter (HOSPITAL_COMMUNITY)
Admission: RE | Admit: 2011-11-04 | Discharge: 2011-11-04 | Disposition: A | Discharge status: Home / Self Care | Payer: Medicare Other | Source: Ambulatory Visit | Attending: Rheumatology | Admitting: Rheumatology

## 2011-11-04 MED ORDER — LORATADINE 10 MG PO TABS: 10.0000 mg | ORAL_TABLET | ORAL | Status: DC

## 2011-11-04 MED ORDER — SODIUM CHLORIDE 0.9 % IV SOLN
INTRAVENOUS | Status: AC
  Administered 2011-11-04: 14:00:00 via INTRAVENOUS

## 2011-11-04 MED ORDER — ACETAMINOPHEN 325 MG PO TABS: 650.0000 mg | ORAL_TABLET | ORAL | Status: DC

## 2011-11-04 MED ORDER — SODIUM CHLORIDE 0.9 % IV SOLN
3.0000 mg/kg | INTRAVENOUS | Status: AC
  Administered 2011-11-04: 300 mg via INTRAVENOUS
  Filled 2011-11-04: qty 30

## 2011-11-05 DIAGNOSIS — M545 Low back pain, unspecified: Secondary | ICD-10-CM | POA: Diagnosis not present

## 2011-11-07 DIAGNOSIS — M545 Low back pain: Secondary | ICD-10-CM | POA: Diagnosis not present

## 2011-11-07 DIAGNOSIS — M5137 Other intervertebral disc degeneration, lumbosacral region: Secondary | ICD-10-CM | POA: Diagnosis not present

## 2011-11-19 DIAGNOSIS — Z79899 Other long term (current) drug therapy: Secondary | ICD-10-CM | POA: Diagnosis not present

## 2011-11-24 DIAGNOSIS — H00019 Hordeolum externum unspecified eye, unspecified eyelid: Secondary | ICD-10-CM | POA: Diagnosis not present

## 2011-11-24 DIAGNOSIS — J309 Allergic rhinitis, unspecified: Secondary | ICD-10-CM | POA: Diagnosis not present

## 2011-11-24 DIAGNOSIS — M545 Low back pain: Secondary | ICD-10-CM | POA: Diagnosis not present

## 2011-12-01 ENCOUNTER — Other Ambulatory Visit (HOSPITAL_COMMUNITY): Payer: Self-pay | Admitting: *Deleted

## 2011-12-02 ENCOUNTER — Encounter (HOSPITAL_COMMUNITY)
Admission: RE | Admit: 2011-12-02 | Discharge: 2011-12-02 | Disposition: A | Discharge status: Home / Self Care | Payer: Medicare Other | Source: Ambulatory Visit | Attending: Rheumatology | Admitting: Rheumatology

## 2011-12-02 DIAGNOSIS — M069 Rheumatoid arthritis, unspecified: Secondary | ICD-10-CM | POA: Diagnosis not present

## 2011-12-02 MED ORDER — ACETAMINOPHEN 325 MG PO TABS: 650.0000 mg | ORAL_TABLET | ORAL | Status: DC

## 2011-12-02 MED ORDER — SODIUM CHLORIDE 0.9 % IV SOLN
300.0000 mg | INTRAVENOUS | Status: DC
  Administered 2011-12-02: 300 mg via INTRAVENOUS
  Filled 2011-12-02: qty 30

## 2011-12-02 MED ORDER — SODIUM CHLORIDE 0.9 % IV SOLN
INTRAVENOUS | Status: DC
  Administered 2011-12-02: 14:00:00 via INTRAVENOUS

## 2011-12-02 MED ORDER — LORATADINE 10 MG PO TABS
10.0000 mg | ORAL_TABLET | ORAL | Status: DC
Start: 2011-12-02 — End: 2011-12-03

## 2011-12-15 DIAGNOSIS — H40029 Open angle with borderline findings, high risk, unspecified eye: Secondary | ICD-10-CM | POA: Diagnosis not present

## 2011-12-15 DIAGNOSIS — H2510 Age-related nuclear cataract, unspecified eye: Secondary | ICD-10-CM | POA: Diagnosis not present

## 2011-12-18 DIAGNOSIS — M1710 Unilateral primary osteoarthritis, unspecified knee: Secondary | ICD-10-CM | POA: Diagnosis not present

## 2011-12-18 DIAGNOSIS — M25569 Pain in unspecified knee: Secondary | ICD-10-CM | POA: Diagnosis not present

## 2011-12-19 DIAGNOSIS — M5137 Other intervertebral disc degeneration, lumbosacral region: Secondary | ICD-10-CM | POA: Diagnosis not present

## 2011-12-19 DIAGNOSIS — M545 Low back pain: Secondary | ICD-10-CM | POA: Diagnosis not present

## 2011-12-22 DIAGNOSIS — I059 Rheumatic mitral valve disease, unspecified: Secondary | ICD-10-CM | POA: Diagnosis not present

## 2011-12-22 DIAGNOSIS — I10 Essential (primary) hypertension: Secondary | ICD-10-CM | POA: Diagnosis not present

## 2011-12-22 DIAGNOSIS — R002 Palpitations: Secondary | ICD-10-CM | POA: Diagnosis not present

## 2011-12-22 DIAGNOSIS — I4949 Other premature depolarization: Secondary | ICD-10-CM | POA: Diagnosis not present

## 2011-12-22 DIAGNOSIS — I779 Disorder of arteries and arterioles, unspecified: Secondary | ICD-10-CM | POA: Diagnosis not present

## 2011-12-22 DIAGNOSIS — E7800 Pure hypercholesterolemia, unspecified: Secondary | ICD-10-CM | POA: Diagnosis not present

## 2011-12-22 DIAGNOSIS — I519 Heart disease, unspecified: Secondary | ICD-10-CM | POA: Diagnosis not present

## 2012-01-05 DIAGNOSIS — Z13820 Encounter for screening for osteoporosis: Secondary | ICD-10-CM | POA: Diagnosis not present

## 2012-01-05 DIAGNOSIS — Z1231 Encounter for screening mammogram for malignant neoplasm of breast: Secondary | ICD-10-CM | POA: Diagnosis not present

## 2012-01-17 DIAGNOSIS — M79609 Pain in unspecified limb: Secondary | ICD-10-CM | POA: Diagnosis not present

## 2012-01-17 DIAGNOSIS — M542 Cervicalgia: Secondary | ICD-10-CM | POA: Diagnosis not present

## 2012-01-19 DIAGNOSIS — E1149 Type 2 diabetes mellitus with other diabetic neurological complication: Secondary | ICD-10-CM | POA: Diagnosis not present

## 2012-01-19 DIAGNOSIS — E039 Hypothyroidism, unspecified: Secondary | ICD-10-CM | POA: Diagnosis not present

## 2012-01-19 DIAGNOSIS — E785 Hyperlipidemia, unspecified: Secondary | ICD-10-CM | POA: Diagnosis not present

## 2012-01-19 DIAGNOSIS — E1142 Type 2 diabetes mellitus with diabetic polyneuropathy: Secondary | ICD-10-CM | POA: Diagnosis not present

## 2012-01-20 DIAGNOSIS — S43429A Sprain of unspecified rotator cuff capsule, initial encounter: Secondary | ICD-10-CM | POA: Diagnosis not present

## 2012-01-20 DIAGNOSIS — S60229A Contusion of unspecified hand, initial encounter: Secondary | ICD-10-CM | POA: Diagnosis not present

## 2012-01-20 DIAGNOSIS — M1710 Unilateral primary osteoarthritis, unspecified knee: Secondary | ICD-10-CM | POA: Diagnosis not present

## 2012-01-23 DIAGNOSIS — M069 Rheumatoid arthritis, unspecified: Secondary | ICD-10-CM | POA: Diagnosis not present

## 2012-01-23 DIAGNOSIS — M25529 Pain in unspecified elbow: Secondary | ICD-10-CM | POA: Diagnosis not present

## 2012-01-23 DIAGNOSIS — M2550 Pain in unspecified joint: Secondary | ICD-10-CM | POA: Diagnosis not present

## 2012-01-23 DIAGNOSIS — M25549 Pain in joints of unspecified hand: Secondary | ICD-10-CM | POA: Diagnosis not present

## 2012-01-23 DIAGNOSIS — Z79899 Other long term (current) drug therapy: Secondary | ICD-10-CM | POA: Diagnosis not present

## 2012-01-23 DIAGNOSIS — Z09 Encounter for follow-up examination after completed treatment for conditions other than malignant neoplasm: Secondary | ICD-10-CM | POA: Diagnosis not present

## 2012-01-26 ENCOUNTER — Other Ambulatory Visit (HOSPITAL_COMMUNITY): Payer: Self-pay | Admitting: *Deleted

## 2012-01-27 ENCOUNTER — Encounter (HOSPITAL_COMMUNITY)
Admission: RE | Admit: 2012-01-27 | Discharge: 2012-01-27 | Disposition: A | Discharge status: Home / Self Care | Payer: Medicare Other | Source: Ambulatory Visit | Attending: Rheumatology | Admitting: Rheumatology

## 2012-01-27 DIAGNOSIS — M069 Rheumatoid arthritis, unspecified: Secondary | ICD-10-CM | POA: Diagnosis not present

## 2012-01-27 MED ORDER — FAMOTIDINE 20 MG PO TABS
40.0000 mg | ORAL_TABLET | Freq: Once | ORAL | Status: AC
  Administered 2012-01-27: 40 mg via ORAL
  Filled 2012-01-27: qty 2

## 2012-01-27 MED ORDER — SODIUM CHLORIDE 0.9 % IV SOLN
INTRAVENOUS | Status: DC
Start: 2012-01-27 — End: 2012-01-28
  Administered 2012-01-27: 13:00:00 via INTRAVENOUS

## 2012-01-27 MED ORDER — INFLIXIMAB 100 MG IV SOLR
500.0000 mg | INTRAVENOUS | Status: DC
  Administered 2012-01-27: 500 mg via INTRAVENOUS
  Filled 2012-01-27: qty 50

## 2012-01-27 MED ORDER — LORATADINE 10 MG PO TABS: 10.0000 mg | ORAL_TABLET | ORAL | Status: DC

## 2012-01-27 MED ORDER — ACETAMINOPHEN 325 MG PO TABS: 650.0000 mg | ORAL_TABLET | ORAL | Status: DC

## 2012-01-30 DIAGNOSIS — R55 Syncope and collapse: Secondary | ICD-10-CM | POA: Diagnosis not present

## 2012-02-11 DIAGNOSIS — E1149 Type 2 diabetes mellitus with other diabetic neurological complication: Secondary | ICD-10-CM | POA: Diagnosis not present

## 2012-02-11 DIAGNOSIS — L608 Other nail disorders: Secondary | ICD-10-CM | POA: Diagnosis not present

## 2012-02-13 DIAGNOSIS — Z79899 Other long term (current) drug therapy: Secondary | ICD-10-CM | POA: Diagnosis not present

## 2012-02-13 DIAGNOSIS — R6889 Other general symptoms and signs: Secondary | ICD-10-CM | POA: Diagnosis not present

## 2012-02-16 DIAGNOSIS — M25519 Pain in unspecified shoulder: Secondary | ICD-10-CM | POA: Diagnosis not present

## 2012-02-18 DIAGNOSIS — M545 Low back pain: Secondary | ICD-10-CM | POA: Diagnosis not present

## 2012-02-21 DIAGNOSIS — M19019 Primary osteoarthritis, unspecified shoulder: Secondary | ICD-10-CM | POA: Diagnosis not present

## 2012-02-23 ENCOUNTER — Other Ambulatory Visit: Payer: Self-pay | Admitting: Neurosurgery

## 2012-02-24 DIAGNOSIS — M67919 Unspecified disorder of synovium and tendon, unspecified shoulder: Secondary | ICD-10-CM | POA: Diagnosis not present

## 2012-02-26 ENCOUNTER — Other Ambulatory Visit: Payer: Self-pay | Admitting: Neurosurgery

## 2012-02-27 DIAGNOSIS — R55 Syncope and collapse: Secondary | ICD-10-CM | POA: Diagnosis not present

## 2012-02-29 DIAGNOSIS — R55 Syncope and collapse: Secondary | ICD-10-CM | POA: Diagnosis not present

## 2012-03-01 DIAGNOSIS — R55 Syncope and collapse: Secondary | ICD-10-CM | POA: Diagnosis not present

## 2012-03-04 DIAGNOSIS — R55 Syncope and collapse: Secondary | ICD-10-CM | POA: Diagnosis not present

## 2012-03-05 DIAGNOSIS — I519 Heart disease, unspecified: Secondary | ICD-10-CM | POA: Diagnosis not present

## 2012-03-05 DIAGNOSIS — Z01810 Encounter for preprocedural cardiovascular examination: Secondary | ICD-10-CM | POA: Diagnosis not present

## 2012-03-05 DIAGNOSIS — R55 Syncope and collapse: Secondary | ICD-10-CM | POA: Diagnosis not present

## 2012-03-05 DIAGNOSIS — I6529 Occlusion and stenosis of unspecified carotid artery: Secondary | ICD-10-CM | POA: Diagnosis not present

## 2012-03-05 DIAGNOSIS — I359 Nonrheumatic aortic valve disorder, unspecified: Secondary | ICD-10-CM | POA: Diagnosis not present

## 2012-03-05 DIAGNOSIS — M79609 Pain in unspecified limb: Secondary | ICD-10-CM | POA: Diagnosis not present

## 2012-03-09 ENCOUNTER — Encounter (HOSPITAL_COMMUNITY)
Admission: RE | Admit: 2012-03-09 | Discharge: 2012-03-09 | Disposition: A | Discharge status: Home / Self Care | Payer: Medicare Other | Source: Ambulatory Visit | Attending: Rheumatology | Admitting: Rheumatology

## 2012-03-09 DIAGNOSIS — M79609 Pain in unspecified limb: Secondary | ICD-10-CM | POA: Diagnosis not present

## 2012-03-09 DIAGNOSIS — M069 Rheumatoid arthritis, unspecified: Secondary | ICD-10-CM | POA: Insufficient documentation

## 2012-03-09 DIAGNOSIS — I6529 Occlusion and stenosis of unspecified carotid artery: Secondary | ICD-10-CM | POA: Diagnosis not present

## 2012-03-09 DIAGNOSIS — I519 Heart disease, unspecified: Secondary | ICD-10-CM | POA: Diagnosis not present

## 2012-03-09 DIAGNOSIS — Z01810 Encounter for preprocedural cardiovascular examination: Secondary | ICD-10-CM | POA: Diagnosis not present

## 2012-03-09 DIAGNOSIS — R55 Syncope and collapse: Secondary | ICD-10-CM | POA: Diagnosis not present

## 2012-03-09 DIAGNOSIS — I359 Nonrheumatic aortic valve disorder, unspecified: Secondary | ICD-10-CM | POA: Diagnosis not present

## 2012-03-09 MED ORDER — ACETAMINOPHEN 325 MG PO TABS
650.0000 mg | ORAL_TABLET | Freq: Once | ORAL | Status: DC
  Administered 2012-03-09: 650 mg via ORAL

## 2012-03-09 MED ORDER — SODIUM CHLORIDE 0.9 % IV SOLN: INTRAVENOUS | Status: DC

## 2012-03-09 MED ORDER — LORATADINE 10 MG PO TABS
ORAL_TABLET | ORAL | Status: AC
  Administered 2012-03-09: 10 mg via ORAL
  Filled 2012-03-09: qty 1

## 2012-03-09 MED ORDER — LORATADINE 10 MG PO TABS
10.0000 mg | ORAL_TABLET | Freq: Every day | ORAL | Status: DC
  Administered 2012-03-09: 10 mg via ORAL

## 2012-03-09 MED ORDER — FAMOTIDINE 20 MG PO TABS: 40.0000 mg | ORAL_TABLET | Freq: Once | ORAL | Status: DC

## 2012-03-09 MED ORDER — ACETAMINOPHEN 325 MG PO TABS
ORAL_TABLET | ORAL | Status: AC
  Administered 2012-03-09: 650 mg via ORAL
  Filled 2012-03-09: qty 2

## 2012-03-09 MED ORDER — SODIUM CHLORIDE 0.9 % IV SOLN
500.0000 mg | INTRAVENOUS | Status: DC
  Filled 2012-03-09: qty 50

## 2012-03-09 NOTE — Progress Notes (Signed)
I went through the MCA questions and the pt stated no to all the questions so the medication was released for pharmacy to make.  Upon further talking with the pt she stated she was having rotator cuff surgery this Friday.  I called Dr. Arlean Hopping office and Lattie Haw at the office stated she should not get the remicade today and should rescedule one month from her surgery date.

## 2012-03-10 ENCOUNTER — Other Ambulatory Visit: Payer: Self-pay | Admitting: Neurosurgery

## 2012-03-10 DIAGNOSIS — R51 Headache: Secondary | ICD-10-CM

## 2012-03-11 DIAGNOSIS — M129 Arthropathy, unspecified: Secondary | ICD-10-CM | POA: Diagnosis not present

## 2012-03-11 DIAGNOSIS — M109 Gout, unspecified: Secondary | ICD-10-CM | POA: Diagnosis not present

## 2012-03-12 DIAGNOSIS — M75120 Complete rotator cuff tear or rupture of unspecified shoulder, not specified as traumatic: Secondary | ICD-10-CM | POA: Diagnosis not present

## 2012-03-12 DIAGNOSIS — S43429A Sprain of unspecified rotator cuff capsule, initial encounter: Secondary | ICD-10-CM | POA: Diagnosis not present

## 2012-03-12 DIAGNOSIS — S43499A Other sprain of unspecified shoulder joint, initial encounter: Secondary | ICD-10-CM | POA: Diagnosis not present

## 2012-03-12 DIAGNOSIS — W19XXXA Unspecified fall, initial encounter: Secondary | ICD-10-CM | POA: Diagnosis not present

## 2012-03-12 DIAGNOSIS — M24119 Other articular cartilage disorders, unspecified shoulder: Secondary | ICD-10-CM | POA: Diagnosis not present

## 2012-03-12 DIAGNOSIS — G8918 Other acute postprocedural pain: Secondary | ICD-10-CM | POA: Diagnosis not present

## 2012-03-17 ENCOUNTER — Other Ambulatory Visit: Payer: Medicare Other

## 2012-03-18 ENCOUNTER — Ambulatory Visit
Admission: RE | Admit: 2012-03-18 | Discharge: 2012-03-18 | Disposition: A | Discharge status: Home / Self Care | Payer: Medicare Other | Source: Ambulatory Visit | Attending: Neurosurgery | Admitting: Neurosurgery

## 2012-03-18 DIAGNOSIS — R51 Headache: Secondary | ICD-10-CM

## 2012-03-18 DIAGNOSIS — M24119 Other articular cartilage disorders, unspecified shoulder: Secondary | ICD-10-CM | POA: Diagnosis not present

## 2012-03-18 DIAGNOSIS — M75120 Complete rotator cuff tear or rupture of unspecified shoulder, not specified as traumatic: Secondary | ICD-10-CM | POA: Diagnosis not present

## 2012-03-18 DIAGNOSIS — M25519 Pain in unspecified shoulder: Secondary | ICD-10-CM | POA: Diagnosis not present

## 2012-03-18 MED ORDER — GADOBENATE DIMEGLUMINE 529 MG/ML IV SOLN
19.0000 mL | Freq: Once | INTRAVENOUS | Status: AC | PRN
  Administered 2012-03-18: 19 mL via INTRAVENOUS

## 2012-03-22 DIAGNOSIS — M75120 Complete rotator cuff tear or rupture of unspecified shoulder, not specified as traumatic: Secondary | ICD-10-CM | POA: Diagnosis not present

## 2012-03-22 DIAGNOSIS — M24119 Other articular cartilage disorders, unspecified shoulder: Secondary | ICD-10-CM | POA: Diagnosis not present

## 2012-03-22 DIAGNOSIS — M25519 Pain in unspecified shoulder: Secondary | ICD-10-CM | POA: Diagnosis not present

## 2012-03-22 DIAGNOSIS — Z09 Encounter for follow-up examination after completed treatment for conditions other than malignant neoplasm: Secondary | ICD-10-CM | POA: Diagnosis not present

## 2012-03-22 DIAGNOSIS — M069 Rheumatoid arthritis, unspecified: Secondary | ICD-10-CM | POA: Diagnosis not present

## 2012-03-22 DIAGNOSIS — M109 Gout, unspecified: Secondary | ICD-10-CM | POA: Diagnosis not present

## 2012-03-24 DIAGNOSIS — M24119 Other articular cartilage disorders, unspecified shoulder: Secondary | ICD-10-CM | POA: Diagnosis not present

## 2012-03-24 DIAGNOSIS — M25519 Pain in unspecified shoulder: Secondary | ICD-10-CM | POA: Diagnosis not present

## 2012-03-26 DIAGNOSIS — M25519 Pain in unspecified shoulder: Secondary | ICD-10-CM | POA: Diagnosis not present

## 2012-03-26 DIAGNOSIS — M24119 Other articular cartilage disorders, unspecified shoulder: Secondary | ICD-10-CM | POA: Diagnosis not present

## 2012-03-29 DIAGNOSIS — M25519 Pain in unspecified shoulder: Secondary | ICD-10-CM | POA: Diagnosis not present

## 2012-03-29 DIAGNOSIS — M75120 Complete rotator cuff tear or rupture of unspecified shoulder, not specified as traumatic: Secondary | ICD-10-CM | POA: Diagnosis not present

## 2012-03-29 DIAGNOSIS — M24119 Other articular cartilage disorders, unspecified shoulder: Secondary | ICD-10-CM | POA: Diagnosis not present

## 2012-03-31 DIAGNOSIS — M75120 Complete rotator cuff tear or rupture of unspecified shoulder, not specified as traumatic: Secondary | ICD-10-CM | POA: Diagnosis not present

## 2012-03-31 DIAGNOSIS — M24119 Other articular cartilage disorders, unspecified shoulder: Secondary | ICD-10-CM | POA: Diagnosis not present

## 2012-03-31 DIAGNOSIS — M25519 Pain in unspecified shoulder: Secondary | ICD-10-CM | POA: Diagnosis not present

## 2012-04-02 DIAGNOSIS — M25519 Pain in unspecified shoulder: Secondary | ICD-10-CM | POA: Diagnosis not present

## 2012-04-02 DIAGNOSIS — M24119 Other articular cartilage disorders, unspecified shoulder: Secondary | ICD-10-CM | POA: Diagnosis not present

## 2012-04-02 DIAGNOSIS — M75120 Complete rotator cuff tear or rupture of unspecified shoulder, not specified as traumatic: Secondary | ICD-10-CM | POA: Diagnosis not present

## 2012-04-05 DIAGNOSIS — N9489 Other specified conditions associated with female genital organs and menstrual cycle: Secondary | ICD-10-CM | POA: Diagnosis not present

## 2012-04-05 DIAGNOSIS — R32 Unspecified urinary incontinence: Secondary | ICD-10-CM | POA: Diagnosis not present

## 2012-04-07 DIAGNOSIS — M25519 Pain in unspecified shoulder: Secondary | ICD-10-CM | POA: Diagnosis not present

## 2012-04-07 DIAGNOSIS — M24119 Other articular cartilage disorders, unspecified shoulder: Secondary | ICD-10-CM | POA: Diagnosis not present

## 2012-04-07 DIAGNOSIS — M75120 Complete rotator cuff tear or rupture of unspecified shoulder, not specified as traumatic: Secondary | ICD-10-CM | POA: Diagnosis not present

## 2012-04-09 DIAGNOSIS — M24119 Other articular cartilage disorders, unspecified shoulder: Secondary | ICD-10-CM | POA: Diagnosis not present

## 2012-04-09 DIAGNOSIS — M25519 Pain in unspecified shoulder: Secondary | ICD-10-CM | POA: Diagnosis not present

## 2012-04-09 DIAGNOSIS — M75120 Complete rotator cuff tear or rupture of unspecified shoulder, not specified as traumatic: Secondary | ICD-10-CM | POA: Diagnosis not present

## 2012-04-12 ENCOUNTER — Encounter (HOSPITAL_COMMUNITY)
Admission: RE | Admit: 2012-04-12 | Discharge: 2012-04-12 | Disposition: A | Discharge status: Home / Self Care | Payer: Medicare Other | Source: Ambulatory Visit | Attending: Rheumatology | Admitting: Rheumatology

## 2012-04-12 DIAGNOSIS — M069 Rheumatoid arthritis, unspecified: Secondary | ICD-10-CM | POA: Insufficient documentation

## 2012-04-12 MED ORDER — SODIUM CHLORIDE 0.9 % IV SOLN: INTRAVENOUS | Status: DC

## 2012-04-12 MED ORDER — SODIUM CHLORIDE 0.9 % IV SOLN
500.0000 mg | INTRAVENOUS | Status: DC
  Administered 2012-04-12: 500 mg via INTRAVENOUS
  Filled 2012-04-12: qty 50

## 2012-04-12 MED ORDER — ACETAMINOPHEN 325 MG PO TABS: 650.0000 mg | ORAL_TABLET | Freq: Once | ORAL | Status: DC

## 2012-04-13 DIAGNOSIS — M25519 Pain in unspecified shoulder: Secondary | ICD-10-CM | POA: Diagnosis not present

## 2012-04-13 DIAGNOSIS — M75120 Complete rotator cuff tear or rupture of unspecified shoulder, not specified as traumatic: Secondary | ICD-10-CM | POA: Diagnosis not present

## 2012-04-13 DIAGNOSIS — M24119 Other articular cartilage disorders, unspecified shoulder: Secondary | ICD-10-CM | POA: Diagnosis not present

## 2012-04-14 DIAGNOSIS — M24119 Other articular cartilage disorders, unspecified shoulder: Secondary | ICD-10-CM | POA: Diagnosis not present

## 2012-04-14 DIAGNOSIS — M75120 Complete rotator cuff tear or rupture of unspecified shoulder, not specified as traumatic: Secondary | ICD-10-CM | POA: Diagnosis not present

## 2012-04-14 DIAGNOSIS — M25519 Pain in unspecified shoulder: Secondary | ICD-10-CM | POA: Diagnosis not present

## 2012-04-16 DIAGNOSIS — E7800 Pure hypercholesterolemia, unspecified: Secondary | ICD-10-CM | POA: Diagnosis not present

## 2012-04-16 DIAGNOSIS — I10 Essential (primary) hypertension: Secondary | ICD-10-CM | POA: Diagnosis not present

## 2012-04-16 DIAGNOSIS — M25519 Pain in unspecified shoulder: Secondary | ICD-10-CM | POA: Diagnosis not present

## 2012-04-16 DIAGNOSIS — Z79899 Other long term (current) drug therapy: Secondary | ICD-10-CM | POA: Diagnosis not present

## 2012-04-16 DIAGNOSIS — M24119 Other articular cartilage disorders, unspecified shoulder: Secondary | ICD-10-CM | POA: Diagnosis not present

## 2012-04-16 DIAGNOSIS — Z0000 Encounter for general adult medical examination without abnormal findings: Secondary | ICD-10-CM | POA: Diagnosis not present

## 2012-04-19 DIAGNOSIS — M75120 Complete rotator cuff tear or rupture of unspecified shoulder, not specified as traumatic: Secondary | ICD-10-CM | POA: Diagnosis not present

## 2012-04-19 DIAGNOSIS — Z79899 Other long term (current) drug therapy: Secondary | ICD-10-CM | POA: Diagnosis not present

## 2012-04-19 DIAGNOSIS — E039 Hypothyroidism, unspecified: Secondary | ICD-10-CM | POA: Diagnosis not present

## 2012-04-19 DIAGNOSIS — M25519 Pain in unspecified shoulder: Secondary | ICD-10-CM | POA: Diagnosis not present

## 2012-04-19 DIAGNOSIS — E1149 Type 2 diabetes mellitus with other diabetic neurological complication: Secondary | ICD-10-CM | POA: Diagnosis not present

## 2012-04-19 DIAGNOSIS — M24119 Other articular cartilage disorders, unspecified shoulder: Secondary | ICD-10-CM | POA: Diagnosis not present

## 2012-04-21 DIAGNOSIS — M25519 Pain in unspecified shoulder: Secondary | ICD-10-CM | POA: Diagnosis not present

## 2012-04-21 DIAGNOSIS — M24119 Other articular cartilage disorders, unspecified shoulder: Secondary | ICD-10-CM | POA: Diagnosis not present

## 2012-04-21 DIAGNOSIS — M75120 Complete rotator cuff tear or rupture of unspecified shoulder, not specified as traumatic: Secondary | ICD-10-CM | POA: Diagnosis not present

## 2012-04-26 DIAGNOSIS — M25519 Pain in unspecified shoulder: Secondary | ICD-10-CM | POA: Diagnosis not present

## 2012-04-26 DIAGNOSIS — M24119 Other articular cartilage disorders, unspecified shoulder: Secondary | ICD-10-CM | POA: Diagnosis not present

## 2012-04-26 DIAGNOSIS — M75120 Complete rotator cuff tear or rupture of unspecified shoulder, not specified as traumatic: Secondary | ICD-10-CM | POA: Diagnosis not present

## 2012-04-28 DIAGNOSIS — M24119 Other articular cartilage disorders, unspecified shoulder: Secondary | ICD-10-CM | POA: Diagnosis not present

## 2012-04-28 DIAGNOSIS — M75120 Complete rotator cuff tear or rupture of unspecified shoulder, not specified as traumatic: Secondary | ICD-10-CM | POA: Diagnosis not present

## 2012-04-28 DIAGNOSIS — M25519 Pain in unspecified shoulder: Secondary | ICD-10-CM | POA: Diagnosis not present

## 2012-04-30 DIAGNOSIS — M75120 Complete rotator cuff tear or rupture of unspecified shoulder, not specified as traumatic: Secondary | ICD-10-CM | POA: Diagnosis not present

## 2012-04-30 DIAGNOSIS — M24119 Other articular cartilage disorders, unspecified shoulder: Secondary | ICD-10-CM | POA: Diagnosis not present

## 2012-04-30 DIAGNOSIS — M25519 Pain in unspecified shoulder: Secondary | ICD-10-CM | POA: Diagnosis not present

## 2012-05-03 DIAGNOSIS — M25519 Pain in unspecified shoulder: Secondary | ICD-10-CM | POA: Diagnosis not present

## 2012-05-03 DIAGNOSIS — M24119 Other articular cartilage disorders, unspecified shoulder: Secondary | ICD-10-CM | POA: Diagnosis not present

## 2012-05-03 DIAGNOSIS — M75120 Complete rotator cuff tear or rupture of unspecified shoulder, not specified as traumatic: Secondary | ICD-10-CM | POA: Diagnosis not present

## 2012-05-05 DIAGNOSIS — L608 Other nail disorders: Secondary | ICD-10-CM | POA: Diagnosis not present

## 2012-05-05 DIAGNOSIS — M24119 Other articular cartilage disorders, unspecified shoulder: Secondary | ICD-10-CM | POA: Diagnosis not present

## 2012-05-05 DIAGNOSIS — M25519 Pain in unspecified shoulder: Secondary | ICD-10-CM | POA: Diagnosis not present

## 2012-05-05 DIAGNOSIS — Q828 Other specified congenital malformations of skin: Secondary | ICD-10-CM | POA: Diagnosis not present

## 2012-05-05 DIAGNOSIS — M75120 Complete rotator cuff tear or rupture of unspecified shoulder, not specified as traumatic: Secondary | ICD-10-CM | POA: Diagnosis not present

## 2012-05-05 DIAGNOSIS — E1149 Type 2 diabetes mellitus with other diabetic neurological complication: Secondary | ICD-10-CM | POA: Diagnosis not present

## 2012-05-07 DIAGNOSIS — M75120 Complete rotator cuff tear or rupture of unspecified shoulder, not specified as traumatic: Secondary | ICD-10-CM | POA: Diagnosis not present

## 2012-05-07 DIAGNOSIS — M25519 Pain in unspecified shoulder: Secondary | ICD-10-CM | POA: Diagnosis not present

## 2012-05-07 DIAGNOSIS — M24119 Other articular cartilage disorders, unspecified shoulder: Secondary | ICD-10-CM | POA: Diagnosis not present

## 2012-05-10 DIAGNOSIS — M25519 Pain in unspecified shoulder: Secondary | ICD-10-CM | POA: Diagnosis not present

## 2012-05-10 DIAGNOSIS — M24119 Other articular cartilage disorders, unspecified shoulder: Secondary | ICD-10-CM | POA: Diagnosis not present

## 2012-05-10 DIAGNOSIS — M75120 Complete rotator cuff tear or rupture of unspecified shoulder, not specified as traumatic: Secondary | ICD-10-CM | POA: Diagnosis not present

## 2012-05-10 DIAGNOSIS — M7500 Adhesive capsulitis of unspecified shoulder: Secondary | ICD-10-CM | POA: Diagnosis not present

## 2012-05-11 DIAGNOSIS — E1149 Type 2 diabetes mellitus with other diabetic neurological complication: Secondary | ICD-10-CM | POA: Diagnosis not present

## 2012-05-11 DIAGNOSIS — E1142 Type 2 diabetes mellitus with diabetic polyneuropathy: Secondary | ICD-10-CM | POA: Diagnosis not present

## 2012-05-11 DIAGNOSIS — E785 Hyperlipidemia, unspecified: Secondary | ICD-10-CM | POA: Diagnosis not present

## 2012-05-11 DIAGNOSIS — E039 Hypothyroidism, unspecified: Secondary | ICD-10-CM | POA: Diagnosis not present

## 2012-05-12 DIAGNOSIS — M24119 Other articular cartilage disorders, unspecified shoulder: Secondary | ICD-10-CM | POA: Diagnosis not present

## 2012-05-12 DIAGNOSIS — M75120 Complete rotator cuff tear or rupture of unspecified shoulder, not specified as traumatic: Secondary | ICD-10-CM | POA: Diagnosis not present

## 2012-05-12 DIAGNOSIS — M7500 Adhesive capsulitis of unspecified shoulder: Secondary | ICD-10-CM | POA: Diagnosis not present

## 2012-05-12 DIAGNOSIS — M25519 Pain in unspecified shoulder: Secondary | ICD-10-CM | POA: Diagnosis not present

## 2012-05-14 DIAGNOSIS — M7500 Adhesive capsulitis of unspecified shoulder: Secondary | ICD-10-CM | POA: Diagnosis not present

## 2012-05-14 DIAGNOSIS — M75120 Complete rotator cuff tear or rupture of unspecified shoulder, not specified as traumatic: Secondary | ICD-10-CM | POA: Diagnosis not present

## 2012-05-14 DIAGNOSIS — M25519 Pain in unspecified shoulder: Secondary | ICD-10-CM | POA: Diagnosis not present

## 2012-05-14 DIAGNOSIS — M24119 Other articular cartilage disorders, unspecified shoulder: Secondary | ICD-10-CM | POA: Diagnosis not present

## 2012-05-17 DIAGNOSIS — M75120 Complete rotator cuff tear or rupture of unspecified shoulder, not specified as traumatic: Secondary | ICD-10-CM | POA: Diagnosis not present

## 2012-05-17 DIAGNOSIS — M25519 Pain in unspecified shoulder: Secondary | ICD-10-CM | POA: Diagnosis not present

## 2012-05-17 DIAGNOSIS — M24119 Other articular cartilage disorders, unspecified shoulder: Secondary | ICD-10-CM | POA: Diagnosis not present

## 2012-05-19 DIAGNOSIS — R51 Headache: Secondary | ICD-10-CM | POA: Diagnosis not present

## 2012-05-19 DIAGNOSIS — M24119 Other articular cartilage disorders, unspecified shoulder: Secondary | ICD-10-CM | POA: Diagnosis not present

## 2012-05-19 DIAGNOSIS — D320 Benign neoplasm of cerebral meninges: Secondary | ICD-10-CM | POA: Diagnosis not present

## 2012-05-19 DIAGNOSIS — M25519 Pain in unspecified shoulder: Secondary | ICD-10-CM | POA: Diagnosis not present

## 2012-05-19 DIAGNOSIS — M75120 Complete rotator cuff tear or rupture of unspecified shoulder, not specified as traumatic: Secondary | ICD-10-CM | POA: Diagnosis not present

## 2012-05-21 ENCOUNTER — Other Ambulatory Visit (HOSPITAL_COMMUNITY): Payer: Self-pay | Admitting: *Deleted

## 2012-05-21 DIAGNOSIS — M24119 Other articular cartilage disorders, unspecified shoulder: Secondary | ICD-10-CM | POA: Diagnosis not present

## 2012-05-21 DIAGNOSIS — M25519 Pain in unspecified shoulder: Secondary | ICD-10-CM | POA: Diagnosis not present

## 2012-05-21 DIAGNOSIS — M75120 Complete rotator cuff tear or rupture of unspecified shoulder, not specified as traumatic: Secondary | ICD-10-CM | POA: Diagnosis not present

## 2012-05-24 ENCOUNTER — Encounter (HOSPITAL_COMMUNITY)
Admission: RE | Admit: 2012-05-24 | Discharge: 2012-05-24 | Disposition: A | Discharge status: Home / Self Care | Payer: Medicare Other | Source: Ambulatory Visit | Attending: Rheumatology | Admitting: Rheumatology

## 2012-05-24 DIAGNOSIS — M069 Rheumatoid arthritis, unspecified: Secondary | ICD-10-CM | POA: Insufficient documentation

## 2012-05-24 MED ORDER — LORATADINE 10 MG PO TABS: 10.0000 mg | ORAL_TABLET | ORAL | Status: DC

## 2012-05-24 MED ORDER — SODIUM CHLORIDE 0.9 % IV SOLN
500.0000 mg | INTRAVENOUS | Status: DC
  Administered 2012-05-24: 500 mg via INTRAVENOUS
  Filled 2012-05-24: qty 50

## 2012-05-24 MED ORDER — SODIUM CHLORIDE 0.9 % IV SOLN
INTRAVENOUS | Status: DC
  Administered 2012-05-24: 14:00:00 via INTRAVENOUS

## 2012-05-24 MED ORDER — ACETAMINOPHEN 325 MG PO TABS: 650.0000 mg | ORAL_TABLET | ORAL | Status: DC

## 2012-05-26 DIAGNOSIS — M75120 Complete rotator cuff tear or rupture of unspecified shoulder, not specified as traumatic: Secondary | ICD-10-CM | POA: Diagnosis not present

## 2012-05-26 DIAGNOSIS — M24119 Other articular cartilage disorders, unspecified shoulder: Secondary | ICD-10-CM | POA: Diagnosis not present

## 2012-05-26 DIAGNOSIS — M25519 Pain in unspecified shoulder: Secondary | ICD-10-CM | POA: Diagnosis not present

## 2012-05-28 DIAGNOSIS — M25519 Pain in unspecified shoulder: Secondary | ICD-10-CM | POA: Diagnosis not present

## 2012-05-28 DIAGNOSIS — M75120 Complete rotator cuff tear or rupture of unspecified shoulder, not specified as traumatic: Secondary | ICD-10-CM | POA: Diagnosis not present

## 2012-05-28 DIAGNOSIS — M24119 Other articular cartilage disorders, unspecified shoulder: Secondary | ICD-10-CM | POA: Diagnosis not present

## 2012-06-02 DIAGNOSIS — M25519 Pain in unspecified shoulder: Secondary | ICD-10-CM | POA: Diagnosis not present

## 2012-06-02 DIAGNOSIS — H40029 Open angle with borderline findings, high risk, unspecified eye: Secondary | ICD-10-CM | POA: Diagnosis not present

## 2012-06-02 DIAGNOSIS — M75120 Complete rotator cuff tear or rupture of unspecified shoulder, not specified as traumatic: Secondary | ICD-10-CM | POA: Diagnosis not present

## 2012-06-02 DIAGNOSIS — M24119 Other articular cartilage disorders, unspecified shoulder: Secondary | ICD-10-CM | POA: Diagnosis not present

## 2012-06-04 DIAGNOSIS — M25519 Pain in unspecified shoulder: Secondary | ICD-10-CM | POA: Diagnosis not present

## 2012-06-04 DIAGNOSIS — M24119 Other articular cartilage disorders, unspecified shoulder: Secondary | ICD-10-CM | POA: Diagnosis not present

## 2012-06-04 DIAGNOSIS — M75120 Complete rotator cuff tear or rupture of unspecified shoulder, not specified as traumatic: Secondary | ICD-10-CM | POA: Diagnosis not present

## 2012-06-07 DIAGNOSIS — M25519 Pain in unspecified shoulder: Secondary | ICD-10-CM | POA: Diagnosis not present

## 2012-06-07 DIAGNOSIS — M24119 Other articular cartilage disorders, unspecified shoulder: Secondary | ICD-10-CM | POA: Diagnosis not present

## 2012-06-07 DIAGNOSIS — M75120 Complete rotator cuff tear or rupture of unspecified shoulder, not specified as traumatic: Secondary | ICD-10-CM | POA: Diagnosis not present

## 2012-06-09 DIAGNOSIS — M24119 Other articular cartilage disorders, unspecified shoulder: Secondary | ICD-10-CM | POA: Diagnosis not present

## 2012-06-09 DIAGNOSIS — M75120 Complete rotator cuff tear or rupture of unspecified shoulder, not specified as traumatic: Secondary | ICD-10-CM | POA: Diagnosis not present

## 2012-06-09 DIAGNOSIS — M25519 Pain in unspecified shoulder: Secondary | ICD-10-CM | POA: Diagnosis not present

## 2012-06-16 DIAGNOSIS — M25519 Pain in unspecified shoulder: Secondary | ICD-10-CM | POA: Diagnosis not present

## 2012-06-16 DIAGNOSIS — M75120 Complete rotator cuff tear or rupture of unspecified shoulder, not specified as traumatic: Secondary | ICD-10-CM | POA: Diagnosis not present

## 2012-06-16 DIAGNOSIS — M24119 Other articular cartilage disorders, unspecified shoulder: Secondary | ICD-10-CM | POA: Diagnosis not present

## 2012-06-22 DIAGNOSIS — Z09 Encounter for follow-up examination after completed treatment for conditions other than malignant neoplasm: Secondary | ICD-10-CM | POA: Diagnosis not present

## 2012-06-22 DIAGNOSIS — M1A00X0 Idiopathic chronic gout, unspecified site, without tophus (tophi): Secondary | ICD-10-CM | POA: Diagnosis not present

## 2012-06-22 DIAGNOSIS — M5137 Other intervertebral disc degeneration, lumbosacral region: Secondary | ICD-10-CM | POA: Diagnosis not present

## 2012-06-22 DIAGNOSIS — M069 Rheumatoid arthritis, unspecified: Secondary | ICD-10-CM | POA: Diagnosis not present

## 2012-06-23 DIAGNOSIS — E1149 Type 2 diabetes mellitus with other diabetic neurological complication: Secondary | ICD-10-CM | POA: Diagnosis not present

## 2012-06-23 DIAGNOSIS — E039 Hypothyroidism, unspecified: Secondary | ICD-10-CM | POA: Diagnosis not present

## 2012-06-24 DIAGNOSIS — M24119 Other articular cartilage disorders, unspecified shoulder: Secondary | ICD-10-CM | POA: Diagnosis not present

## 2012-06-24 DIAGNOSIS — M25519 Pain in unspecified shoulder: Secondary | ICD-10-CM | POA: Diagnosis not present

## 2012-06-24 DIAGNOSIS — M75120 Complete rotator cuff tear or rupture of unspecified shoulder, not specified as traumatic: Secondary | ICD-10-CM | POA: Diagnosis not present

## 2012-07-12 ENCOUNTER — Encounter (HOSPITAL_COMMUNITY)
Admission: RE | Admit: 2012-07-12 | Discharge: 2012-07-12 | Disposition: A | Discharge status: Home / Self Care | Payer: Medicare Other | Source: Ambulatory Visit | Attending: Rheumatology | Admitting: Rheumatology

## 2012-07-12 DIAGNOSIS — M069 Rheumatoid arthritis, unspecified: Secondary | ICD-10-CM | POA: Diagnosis not present

## 2012-07-12 MED ORDER — ACETAMINOPHEN 325 MG PO TABS
ORAL_TABLET | ORAL | Status: AC
  Filled 2012-07-12: qty 2

## 2012-07-12 MED ORDER — SODIUM CHLORIDE 0.9 % IV SOLN
500.0000 mg | INTRAVENOUS | Status: DC
  Administered 2012-07-12: 500 mg via INTRAVENOUS
  Filled 2012-07-12: qty 50

## 2012-07-12 MED ORDER — ACETAMINOPHEN 325 MG PO TABS
650.0000 mg | ORAL_TABLET | ORAL | Status: DC
  Administered 2012-07-12: 650 mg via ORAL

## 2012-07-12 MED ORDER — DIPHENHYDRAMINE HCL 25 MG PO TABS
25.0000 mg | ORAL_TABLET | Freq: Once | ORAL | Status: AC
  Administered 2012-07-12: 25 mg via ORAL
  Filled 2012-07-12: qty 1

## 2012-07-12 MED ORDER — LORATADINE 10 MG PO TABS: 10.0000 mg | ORAL_TABLET | ORAL | Status: DC

## 2012-07-12 MED ORDER — SODIUM CHLORIDE 0.9 % IV SOLN
INTRAVENOUS | Status: DC
  Administered 2012-07-12: 14:00:00 via INTRAVENOUS

## 2012-07-12 NOTE — Progress Notes (Signed)
Pt. Forgot to take Zyrtec today and Pepcid 40 mg. Last PM; spoke with Genevie Ann, LPN @ office; states to hold these meds today and give Benadryl 25 mg. PO today prior to Remicade infusion

## 2012-07-13 DIAGNOSIS — M75120 Complete rotator cuff tear or rupture of unspecified shoulder, not specified as traumatic: Secondary | ICD-10-CM | POA: Diagnosis not present

## 2012-07-19 DIAGNOSIS — E1149 Type 2 diabetes mellitus with other diabetic neurological complication: Secondary | ICD-10-CM | POA: Diagnosis not present

## 2012-07-19 DIAGNOSIS — H6120 Impacted cerumen, unspecified ear: Secondary | ICD-10-CM | POA: Diagnosis not present

## 2012-07-19 DIAGNOSIS — E1142 Type 2 diabetes mellitus with diabetic polyneuropathy: Secondary | ICD-10-CM | POA: Diagnosis not present

## 2012-07-19 DIAGNOSIS — M62838 Other muscle spasm: Secondary | ICD-10-CM | POA: Diagnosis not present

## 2012-07-26 DIAGNOSIS — H2510 Age-related nuclear cataract, unspecified eye: Secondary | ICD-10-CM | POA: Diagnosis not present

## 2012-07-26 DIAGNOSIS — H40029 Open angle with borderline findings, high risk, unspecified eye: Secondary | ICD-10-CM | POA: Diagnosis not present

## 2012-08-04 DIAGNOSIS — B351 Tinea unguium: Secondary | ICD-10-CM | POA: Diagnosis not present

## 2012-08-04 DIAGNOSIS — M79609 Pain in unspecified limb: Secondary | ICD-10-CM | POA: Diagnosis not present

## 2012-08-04 DIAGNOSIS — E1149 Type 2 diabetes mellitus with other diabetic neurological complication: Secondary | ICD-10-CM | POA: Diagnosis not present

## 2012-08-16 DIAGNOSIS — H2510 Age-related nuclear cataract, unspecified eye: Secondary | ICD-10-CM | POA: Diagnosis not present

## 2012-08-16 DIAGNOSIS — H524 Presbyopia: Secondary | ICD-10-CM | POA: Diagnosis not present

## 2012-08-16 DIAGNOSIS — H40029 Open angle with borderline findings, high risk, unspecified eye: Secondary | ICD-10-CM | POA: Diagnosis not present

## 2012-08-17 ENCOUNTER — Other Ambulatory Visit: Payer: Self-pay | Admitting: Internal Medicine

## 2012-08-17 DIAGNOSIS — M542 Cervicalgia: Secondary | ICD-10-CM | POA: Diagnosis not present

## 2012-08-17 DIAGNOSIS — Z79899 Other long term (current) drug therapy: Secondary | ICD-10-CM | POA: Diagnosis not present

## 2012-08-17 DIAGNOSIS — E669 Obesity, unspecified: Secondary | ICD-10-CM | POA: Diagnosis not present

## 2012-08-17 DIAGNOSIS — E1142 Type 2 diabetes mellitus with diabetic polyneuropathy: Secondary | ICD-10-CM | POA: Diagnosis not present

## 2012-08-17 DIAGNOSIS — E039 Hypothyroidism, unspecified: Secondary | ICD-10-CM | POA: Diagnosis not present

## 2012-08-17 DIAGNOSIS — E785 Hyperlipidemia, unspecified: Secondary | ICD-10-CM | POA: Diagnosis not present

## 2012-08-17 DIAGNOSIS — E1149 Type 2 diabetes mellitus with other diabetic neurological complication: Secondary | ICD-10-CM | POA: Diagnosis not present

## 2012-08-20 DIAGNOSIS — M7150 Other bursitis, not elsewhere classified, unspecified site: Secondary | ICD-10-CM | POA: Diagnosis not present

## 2012-08-23 ENCOUNTER — Encounter (HOSPITAL_COMMUNITY)
Admission: RE | Admit: 2012-08-23 | Discharge: 2012-08-23 | Disposition: A | Discharge status: Home / Self Care | Payer: Medicare Other | Source: Ambulatory Visit | Attending: Rheumatology | Admitting: Rheumatology

## 2012-08-23 DIAGNOSIS — M069 Rheumatoid arthritis, unspecified: Secondary | ICD-10-CM | POA: Insufficient documentation

## 2012-08-23 MED ORDER — LORATADINE 10 MG PO TABS
10.0000 mg | ORAL_TABLET | ORAL | Status: DC
  Administered 2012-08-23: 10 mg via ORAL

## 2012-08-23 MED ORDER — SODIUM CHLORIDE 0.9 % IV SOLN
INTRAVENOUS | Status: DC
  Administered 2012-08-23: 14:00:00 via INTRAVENOUS

## 2012-08-23 MED ORDER — LORATADINE 10 MG PO TABS
ORAL_TABLET | ORAL | Status: AC
  Filled 2012-08-23: qty 1

## 2012-08-23 MED ORDER — SODIUM CHLORIDE 0.9 % IV SOLN
500.0000 mg | INTRAVENOUS | Status: DC
  Administered 2012-08-23: 500 mg via INTRAVENOUS
  Filled 2012-08-23: qty 50

## 2012-08-23 MED ORDER — ACETAMINOPHEN 325 MG PO TABS: 650.0000 mg | ORAL_TABLET | ORAL | Status: DC

## 2012-08-23 NOTE — Progress Notes (Signed)
CLIENT TOOK ZYRTEC FOR 3DAYS PRIOR TO TODAY; CLIENT TOOK PEPCID LAST NIGHT; CLIENT TOOK TYLENOL  PRIOR TO COMING TO SHORT STAY

## 2012-08-24 ENCOUNTER — Other Ambulatory Visit: Payer: Medicare Other

## 2012-08-25 ENCOUNTER — Ambulatory Visit
Admission: RE | Admit: 2012-08-25 | Discharge: 2012-08-25 | Disposition: A | Discharge status: Home / Self Care | Payer: Medicare Other | Source: Ambulatory Visit | Attending: Internal Medicine | Admitting: Internal Medicine

## 2012-08-25 DIAGNOSIS — M542 Cervicalgia: Secondary | ICD-10-CM | POA: Diagnosis not present

## 2012-09-29 DIAGNOSIS — Z23 Encounter for immunization: Secondary | ICD-10-CM | POA: Diagnosis not present

## 2012-09-30 ENCOUNTER — Other Ambulatory Visit (HOSPITAL_COMMUNITY): Payer: Self-pay | Admitting: *Deleted

## 2012-10-04 ENCOUNTER — Encounter (HOSPITAL_COMMUNITY)
Admission: RE | Admit: 2012-10-04 | Discharge: 2012-10-04 | Disposition: A | Discharge status: Home / Self Care | Payer: Medicare Other | Source: Ambulatory Visit | Attending: Rheumatology | Admitting: Rheumatology

## 2012-10-04 DIAGNOSIS — M069 Rheumatoid arthritis, unspecified: Secondary | ICD-10-CM | POA: Insufficient documentation

## 2012-10-04 MED ORDER — LORATADINE 10 MG PO TABS: 10.0000 mg | ORAL_TABLET | ORAL | Status: AC

## 2012-10-04 MED ORDER — SODIUM CHLORIDE 0.9 % IV SOLN
INTRAVENOUS | Status: AC
  Administered 2012-10-04: 250 mL via INTRAVENOUS

## 2012-10-04 MED ORDER — LORATADINE 10 MG PO TABS
ORAL_TABLET | ORAL | Status: AC
  Administered 2012-10-04: 10 mg via ORAL
  Filled 2012-10-04: qty 1

## 2012-10-04 MED ORDER — ACETAMINOPHEN 325 MG PO TABS: 650.0000 mg | ORAL_TABLET | ORAL | Status: AC

## 2012-10-04 MED ORDER — FAMOTIDINE 20 MG PO TABS
40.0000 mg | ORAL_TABLET | Freq: Once | ORAL | Status: AC
  Administered 2012-10-04: 40 mg via ORAL
  Filled 2012-10-04: qty 2

## 2012-10-04 MED ORDER — SODIUM CHLORIDE 0.9 % IV SOLN
500.0000 mg | INTRAVENOUS | Status: AC
  Administered 2012-10-04: 500 mg via INTRAVENOUS
  Filled 2012-10-04: qty 50

## 2012-10-04 MED ORDER — ACETAMINOPHEN 325 MG PO TABS
ORAL_TABLET | ORAL | Status: AC
  Administered 2012-10-04: 650 mg via ORAL
  Filled 2012-10-04: qty 2

## 2012-10-04 NOTE — Progress Notes (Signed)
Patient reports that she did not take Pepcid at home. Called office-orders received.

## 2012-10-05 DIAGNOSIS — M25569 Pain in unspecified knee: Secondary | ICD-10-CM | POA: Diagnosis not present

## 2012-10-05 DIAGNOSIS — M069 Rheumatoid arthritis, unspecified: Secondary | ICD-10-CM | POA: Diagnosis not present

## 2012-10-05 DIAGNOSIS — Z09 Encounter for follow-up examination after completed treatment for conditions other than malignant neoplasm: Secondary | ICD-10-CM | POA: Diagnosis not present

## 2012-10-18 DIAGNOSIS — IMO0001 Reserved for inherently not codable concepts without codable children: Secondary | ICD-10-CM | POA: Diagnosis not present

## 2012-10-18 DIAGNOSIS — E7800 Pure hypercholesterolemia, unspecified: Secondary | ICD-10-CM | POA: Diagnosis not present

## 2012-10-18 DIAGNOSIS — I10 Essential (primary) hypertension: Secondary | ICD-10-CM | POA: Diagnosis not present

## 2012-10-18 DIAGNOSIS — K3189 Other diseases of stomach and duodenum: Secondary | ICD-10-CM | POA: Diagnosis not present

## 2012-10-18 DIAGNOSIS — B356 Tinea cruris: Secondary | ICD-10-CM | POA: Diagnosis not present

## 2012-10-18 DIAGNOSIS — E559 Vitamin D deficiency, unspecified: Secondary | ICD-10-CM | POA: Diagnosis not present

## 2012-10-18 DIAGNOSIS — Z6833 Body mass index (BMI) 33.0-33.9, adult: Secondary | ICD-10-CM | POA: Diagnosis not present

## 2012-10-18 DIAGNOSIS — K912 Postsurgical malabsorption, not elsewhere classified: Secondary | ICD-10-CM | POA: Diagnosis not present

## 2012-10-22 DIAGNOSIS — M545 Low back pain: Secondary | ICD-10-CM | POA: Diagnosis not present

## 2012-10-22 DIAGNOSIS — R269 Unspecified abnormalities of gait and mobility: Secondary | ICD-10-CM | POA: Diagnosis not present

## 2012-10-22 DIAGNOSIS — M1710 Unilateral primary osteoarthritis, unspecified knee: Secondary | ICD-10-CM | POA: Diagnosis not present

## 2012-10-22 DIAGNOSIS — M25519 Pain in unspecified shoulder: Secondary | ICD-10-CM | POA: Diagnosis not present

## 2012-10-29 DIAGNOSIS — M545 Low back pain: Secondary | ICD-10-CM | POA: Diagnosis not present

## 2012-10-29 DIAGNOSIS — IMO0002 Reserved for concepts with insufficient information to code with codable children: Secondary | ICD-10-CM | POA: Diagnosis not present

## 2012-10-29 DIAGNOSIS — M47817 Spondylosis without myelopathy or radiculopathy, lumbosacral region: Secondary | ICD-10-CM | POA: Diagnosis not present

## 2012-10-29 DIAGNOSIS — M5137 Other intervertebral disc degeneration, lumbosacral region: Secondary | ICD-10-CM | POA: Diagnosis not present

## 2012-11-15 ENCOUNTER — Encounter (HOSPITAL_COMMUNITY)
Admission: RE | Admit: 2012-11-15 | Discharge: 2012-11-15 | Disposition: A | Discharge status: Home / Self Care | Payer: Medicare Other | Source: Ambulatory Visit | Attending: Rheumatology | Admitting: Rheumatology

## 2012-11-15 DIAGNOSIS — M069 Rheumatoid arthritis, unspecified: Secondary | ICD-10-CM | POA: Diagnosis not present

## 2012-11-15 MED ORDER — SODIUM CHLORIDE 0.9 % IV SOLN
Freq: Once | INTRAVENOUS | Status: AC
  Administered 2012-11-15: 13:00:00 via INTRAVENOUS

## 2012-11-15 MED ORDER — SODIUM CHLORIDE 0.9 % IV SOLN
5.0000 mg/kg | Freq: Once | INTRAVENOUS | Status: AC
  Administered 2012-11-15: 13:00:00 400 mg via INTRAVENOUS
  Filled 2012-11-15: qty 40

## 2012-11-15 MED ORDER — ACETAMINOPHEN 325 MG PO TABS: 650.0000 mg | ORAL_TABLET | Freq: Once | ORAL | Status: DC

## 2012-11-15 MED ORDER — ACETAMINOPHEN 325 MG PO TABS
ORAL_TABLET | ORAL | Status: AC
  Filled 2012-11-15: qty 2

## 2012-11-25 DIAGNOSIS — M545 Low back pain, unspecified: Secondary | ICD-10-CM | POA: Diagnosis not present

## 2012-11-25 DIAGNOSIS — R51 Headache: Secondary | ICD-10-CM | POA: Diagnosis not present

## 2012-11-25 DIAGNOSIS — M5137 Other intervertebral disc degeneration, lumbosacral region: Secondary | ICD-10-CM | POA: Diagnosis not present

## 2012-11-25 DIAGNOSIS — IMO0002 Reserved for concepts with insufficient information to code with codable children: Secondary | ICD-10-CM | POA: Diagnosis not present

## 2012-12-20 DIAGNOSIS — M5137 Other intervertebral disc degeneration, lumbosacral region: Secondary | ICD-10-CM | POA: Diagnosis not present

## 2012-12-20 DIAGNOSIS — E669 Obesity, unspecified: Secondary | ICD-10-CM | POA: Diagnosis not present

## 2012-12-20 DIAGNOSIS — IMO0002 Reserved for concepts with insufficient information to code with codable children: Secondary | ICD-10-CM | POA: Diagnosis not present

## 2012-12-20 DIAGNOSIS — M545 Low back pain: Secondary | ICD-10-CM | POA: Diagnosis not present

## 2012-12-21 DIAGNOSIS — N644 Mastodynia: Secondary | ICD-10-CM | POA: Diagnosis not present

## 2012-12-21 DIAGNOSIS — B379 Candidiasis, unspecified: Secondary | ICD-10-CM | POA: Diagnosis not present

## 2012-12-21 DIAGNOSIS — L259 Unspecified contact dermatitis, unspecified cause: Secondary | ICD-10-CM | POA: Diagnosis not present

## 2012-12-21 DIAGNOSIS — R32 Unspecified urinary incontinence: Secondary | ICD-10-CM | POA: Diagnosis not present

## 2012-12-21 DIAGNOSIS — D239 Other benign neoplasm of skin, unspecified: Secondary | ICD-10-CM | POA: Diagnosis not present

## 2012-12-22 ENCOUNTER — Encounter: Payer: Self-pay | Admitting: Cardiology

## 2012-12-22 ENCOUNTER — Ambulatory Visit (INDEPENDENT_AMBULATORY_CARE_PROVIDER_SITE_OTHER): Payer: Medicare Other | Admitting: Cardiology

## 2012-12-22 VITALS — BP 131/56 | HR 62 | Ht 64.0 in

## 2012-12-22 DIAGNOSIS — I10 Essential (primary) hypertension: Secondary | ICD-10-CM

## 2012-12-22 DIAGNOSIS — I493 Ventricular premature depolarization: Secondary | ICD-10-CM | POA: Insufficient documentation

## 2012-12-22 DIAGNOSIS — I359 Nonrheumatic aortic valve disorder, unspecified: Secondary | ICD-10-CM

## 2012-12-22 DIAGNOSIS — I350 Nonrheumatic aortic (valve) stenosis: Secondary | ICD-10-CM

## 2012-12-22 DIAGNOSIS — E785 Hyperlipidemia, unspecified: Secondary | ICD-10-CM | POA: Diagnosis not present

## 2012-12-22 DIAGNOSIS — I4949 Other premature depolarization: Secondary | ICD-10-CM | POA: Diagnosis not present

## 2012-12-22 DIAGNOSIS — R55 Syncope and collapse: Secondary | ICD-10-CM

## 2012-12-22 DIAGNOSIS — I459 Conduction disorder, unspecified: Secondary | ICD-10-CM | POA: Insufficient documentation

## 2012-12-22 HISTORY — DX: Nonrheumatic aortic (valve) stenosis: I35.0

## 2012-12-22 HISTORY — DX: Hyperlipidemia, unspecified: E78.5

## 2012-12-22 HISTORY — DX: Ventricular premature depolarization: I49.3

## 2012-12-22 NOTE — Patient Instructions (Signed)
Your physician recommends that you continue on your current medications as directed. Please refer to the Current Medication list given to you today.  Your physician wants you to follow-up in: 1 year Dr. Marlou Porch. You will receive a reminder letter in the mail two months in advance. If you don't receive a letter, please call our office to schedule the follow-up appointment.

## 2012-12-22 NOTE — Progress Notes (Signed)
[place]. [address], [address], [place]  [address] Phone: [phone] Fax:  [phone]  Date:  12/22/2012   ID:  [name], DOB 21-Mar-1940, MRN [id number]  PCP:  Lilian Coma, MD   History of Present Illness: [name] is a 72 y.o. female with history of carotid artery disease, with bilateral ICA stenosis of up to 40%, mild aortic insufficiency/stenosis, diastolic dysfunction and type 2 diabetes. EKG technologist nonspecific ST-T wave changes. Patient has fairly good functional status. In late January, she had an episode of syncope where she was standing in the kitchen/working felt dizzy fell then returned to consciousness as she hit the floor. currently she describes no significant warning. She woke up and was in her usual state. She did need some help getting up off of the floor. No diaphoresis. She is a Restaurant manager, fast food. Hemoglobin 12.0, creatinine 1.36.   Event monitor 2/14 showed no adverse arrhythmias.PVC's  Nuclear stress test: 03/05/12-low risk, no ischemia, normal EF  Echocardiogram: 3/14- Conclusions: 1. Normal LV size and function.  2. Left ventricular ejection fraction estimated by 2D at 60-65 percent.  3. There were no regional wall motion abnormalities.  4. Borderline left atrial enlargement.  5. Mildly thickened and calcified mitral valve.  6. Mild mitral valve regurgitation.  7. Trivial tricuspid regurgitation.  8. Moderate calcification of the aortic valve.  9. Mild aortic valve stenosis. 2.41m/s peak velocity, 78mmHg mean gradient.  10. Trace aortic valve regurgitation.  11. There is borderline aortic root dilatation. 3.7cm.  12. Calcified aortic root.  13. Analysis of mitral valve inflow, pulmonary vein Doppler and tissue Doppler suggests grade I diastolic dysfunction without elevated left atrial pressure.       Wt Readings from Last 3 Encounters:  11/15/12 189 lb (85.73 kg)  10/04/12 189 lb (85.73 kg)    08/23/12 198 lb (89.812 kg)     Past Medical History  Diagnosis Date  . Rheumatoid arthritis(714.0)   . Glaucoma   . HTN (hypertension)   . Hypothyroidism   . Diabetes mellitus   . Hypercholesteremia   . Chronic pain   . Allergic rhinitis   . Peripheral neuropathy   . Osteoarthritis   . Diverticulosis   . Hemorrhoids     internal  . PPD positive     6 months ago  . Anemia   . Sickle cell trait   . Meningioma 2008  . CKD (chronic kidney disease), stage III     Past Surgical History  Procedure Laterality Date  . Joint replacement  2001    left TKR  . Fistula repair  03/2010    partial colectomy  . Intracranial surgery  11/2007    removal of meningioma  . Total thyroidectomy  1992  . Rotator cuff repair  1999    BIlateral  . Sigmoidectomy  2012  . Abdominal hysterectomy  1978    TAH,& BSO 1  . Tonsillectomy and adenoidectomy    . Wrist sx      right     Current Outpatient Prescriptions  Medication Sig Dispense Refill  . InFLIXimab (REMICADE IV) Inject into the vein.      Marland Kitchen allopurinol (ZYLOPRIM) 300 MG tablet Take 300 mg by mouth daily.      Marland Kitchen atorvastatin (LIPITOR) 40 MG tablet Take 40 mg by mouth daily.      . cholecalciferol (VITAMIN D) 1000 UNITS tablet Take 400 Units by mouth daily. 2 capsule      .  cyclobenzaprine (FLEXERIL) 10 MG tablet Take 10 mg by mouth 3 (three) times daily as needed.      . ferrous fumarate (HEMOCYTE - 106 MG FE) 325 (106 FE) MG TABS Take 1 tablet by mouth.      . folic acid (FOLVITE) 1 MG tablet Take 1 mg by mouth daily.      Marland Kitchen levothyroxine (SYNTHROID, LEVOTHROID) 175 MCG tablet Take 175 mcg by mouth daily.      Marland Kitchen losartan (COZAAR) 50 MG tablet Take 50 mg by mouth daily.      . multivitamin (THERAGRAN) per tablet Take 1 tablet by mouth daily.      . pregabalin (LYRICA) 75 MG capsule Take 75 mg by mouth 2 (two) times daily.      Marland Kitchen zolpidem (AMBIEN) 10 MG tablet Take 10 mg by mouth at bedtime as needed.       No current  facility-administered medications for this visit.    Allergies:    Allergies  Allergen Reactions  . Penicillins Rash  . Other     Blood products  . Zocor [Simvastatin] Other (See Comments)    Leg cramps    Social History:  The patient  reports that she quit smoking about 50 years ago. She does not have any smokeless tobacco history on file. She reports that she does not use illicit drugs.   ROS:  Please see the history of present illness.   Denies any bleeding, syncope, orthopnea, PND, no shortness of breath.    PHYSICAL EXAM: VS:  BP 131/56  Pulse 62  Ht 5\' 4"  (1.626 m) Well nourished, well developed, in no acute distress HEENT: normal Neck: no JVD Cardiac:  normal S1, S2; RRR; 2/6 systolic murmur Lungs:  clear to auscultation bilaterally, no wheezing, rhonchi or rales Abd: soft, nontender, no hepatomegaly Ext: no edema Skin: warm and dry Neuro: no focal abnormalities noted, speech difficulty  EKG:  None today.      ASSESSMENT AND PLAN:  1. Mild aortic stenosis-continue to monitor clinically. Exercise. Monitor blood pressure. 2. Hyperlipidemia-continue with atorvastatin. This is being monitored by Dr. Stephanie Acre. 3. Hypertension-currently well controlled on angiotensin receptor blocker. 4. PVCs-seen on event monitor. Benign. 5. Syncope-no further occurrences. Cardiac workup reassuring. One year follow up. Signed, Candee Furbish, MD [facility]  12/22/2012 1:31 PM

## 2012-12-24 ENCOUNTER — Other Ambulatory Visit (HOSPITAL_COMMUNITY): Payer: Self-pay | Admitting: *Deleted

## 2012-12-27 ENCOUNTER — Encounter (HOSPITAL_COMMUNITY)
Admission: RE | Admit: 2012-12-27 | Discharge: 2012-12-27 | Disposition: A | Discharge status: Home / Self Care | Payer: Medicare Other | Source: Ambulatory Visit | Attending: Rheumatology | Admitting: Rheumatology

## 2012-12-27 DIAGNOSIS — M069 Rheumatoid arthritis, unspecified: Secondary | ICD-10-CM | POA: Insufficient documentation

## 2012-12-27 LAB — COMPREHENSIVE METABOLIC PANEL
AST: 17 U/L (ref 0–37)
Albumin: 3.4 g/dL — ABNORMAL LOW (ref 3.5–5.2)
Alkaline Phosphatase: 82 U/L (ref 39–117)
Chloride: 104 mEq/L (ref 96–112)
Creatinine, Ser: 0.92 mg/dL (ref 0.50–1.10)
Potassium: 4.1 mEq/L (ref 3.5–5.1)
Total Bilirubin: 0.3 mg/dL (ref 0.3–1.2)
Total Protein: 7.1 g/dL (ref 6.0–8.3)

## 2012-12-27 LAB — CBC
HCT: 30 % — ABNORMAL LOW (ref 36.0–46.0)
Platelets: 217 10*3/uL (ref 150–400)
RBC: 3.39 MIL/uL — ABNORMAL LOW (ref 3.87–5.11)
RDW: 15.1 % (ref 11.5–15.5)
WBC: 7.3 10*3/uL (ref 4.0–10.5)

## 2012-12-27 MED ORDER — SODIUM CHLORIDE 0.9 % IV SOLN
5.0000 mg/kg | INTRAVENOUS | Status: DC
  Filled 2012-12-27: qty 40

## 2012-12-27 MED ORDER — SODIUM CHLORIDE 0.9 % IV SOLN
INTRAVENOUS | Status: DC
  Administered 2012-12-27: 13:00:00 via INTRAVENOUS

## 2012-12-27 MED ORDER — SODIUM CHLORIDE 0.9 % IV SOLN
500.0000 mg | INTRAVENOUS | Status: DC
  Administered 2012-12-27: 13:00:00 500 mg via INTRAVENOUS
  Filled 2012-12-27: qty 50

## 2012-12-27 MED ORDER — ACETAMINOPHEN 325 MG PO TABS: 650.0000 mg | ORAL_TABLET | ORAL | Status: DC

## 2012-12-28 DIAGNOSIS — N644 Mastodynia: Secondary | ICD-10-CM | POA: Diagnosis not present

## 2012-12-28 DIAGNOSIS — N63 Unspecified lump in unspecified breast: Secondary | ICD-10-CM | POA: Diagnosis not present

## 2013-01-10 DIAGNOSIS — L820 Inflamed seborrheic keratosis: Secondary | ICD-10-CM | POA: Diagnosis not present

## 2013-01-10 DIAGNOSIS — L538 Other specified erythematous conditions: Secondary | ICD-10-CM | POA: Diagnosis not present

## 2013-01-10 DIAGNOSIS — M545 Low back pain, unspecified: Secondary | ICD-10-CM | POA: Diagnosis not present

## 2013-01-10 DIAGNOSIS — M2550 Pain in unspecified joint: Secondary | ICD-10-CM | POA: Diagnosis not present

## 2013-01-10 DIAGNOSIS — M25569 Pain in unspecified knee: Secondary | ICD-10-CM | POA: Diagnosis not present

## 2013-01-12 DIAGNOSIS — M25569 Pain in unspecified knee: Secondary | ICD-10-CM | POA: Diagnosis not present

## 2013-01-12 DIAGNOSIS — M545 Low back pain, unspecified: Secondary | ICD-10-CM | POA: Diagnosis not present

## 2013-01-12 DIAGNOSIS — M2550 Pain in unspecified joint: Secondary | ICD-10-CM | POA: Diagnosis not present

## 2013-01-17 DIAGNOSIS — M25569 Pain in unspecified knee: Secondary | ICD-10-CM | POA: Diagnosis not present

## 2013-01-17 DIAGNOSIS — M545 Low back pain, unspecified: Secondary | ICD-10-CM | POA: Diagnosis not present

## 2013-01-17 DIAGNOSIS — M2550 Pain in unspecified joint: Secondary | ICD-10-CM | POA: Diagnosis not present

## 2013-01-19 ENCOUNTER — Encounter: Payer: Self-pay | Admitting: Podiatrist

## 2013-01-19 ENCOUNTER — Ambulatory Visit (INDEPENDENT_AMBULATORY_CARE_PROVIDER_SITE_OTHER): Payer: Medicare Other | Admitting: Podiatrist

## 2013-01-19 VITALS — BP 147/62 | HR 73 | Resp 16

## 2013-01-19 DIAGNOSIS — M79609 Pain in unspecified limb: Secondary | ICD-10-CM | POA: Diagnosis not present

## 2013-01-19 DIAGNOSIS — M2040 Other hammer toe(s) (acquired), unspecified foot: Secondary | ICD-10-CM

## 2013-01-19 DIAGNOSIS — B351 Tinea unguium: Secondary | ICD-10-CM | POA: Diagnosis not present

## 2013-01-19 DIAGNOSIS — M21619 Bunion of unspecified foot: Secondary | ICD-10-CM

## 2013-01-19 DIAGNOSIS — E1140 Type 2 diabetes mellitus with diabetic neuropathy, unspecified: Secondary | ICD-10-CM

## 2013-01-19 DIAGNOSIS — E1149 Type 2 diabetes mellitus with other diabetic neurological complication: Secondary | ICD-10-CM

## 2013-01-19 MED ORDER — DICLOFENAC SODIUM 1 % TD GEL: 2.0000 g | Freq: Four times a day (QID) | TRANSDERMAL | Status: DC

## 2013-01-19 NOTE — Patient Instructions (Signed)
Diabetes and Foot Care Diabetes may cause you to have problems because of poor blood supply (circulation) to your feet and legs. This may cause the skin on your feet to become thinner, break easier, and heal more slowly. Your skin may become dry, and the skin may peel and crack. You may also have nerve damage in your legs and feet causing decreased feeling in them. You may not notice minor injuries to your feet that could lead to infections or more serious problems. Taking care of your feet is one of the most important things you can do for yourself.  HOME CARE INSTRUCTIONS  Wear shoes at all times, even in the house. Do not go barefoot. Bare feet are easily injured.  Check your feet daily for blisters, cuts, and redness. If you cannot see the bottom of your feet, use a mirror or ask someone for help.  Wash your feet with warm water (do not use hot water) and mild soap. Then pat your feet and the areas between your toes until they are completely dry. Do not soak your feet as this can dry your skin.  Apply a moisturizing lotion or petroleum jelly (that does not contain alcohol and is unscented) to the skin on your feet and to dry, brittle toenails. Do not apply lotion between your toes.  Trim your toenails straight across. Do not dig under them or around the cuticle. File the edges of your nails with an emery board or nail file.  Do not cut corns or calluses or try to remove them with medicine.  Wear clean socks or stockings every day. Make sure they are not too tight. Do not wear knee-high stockings since they may decrease blood flow to your legs.  Wear shoes that fit properly and have enough cushioning. To break in new shoes, wear them for just a few hours a day. This prevents you from injuring your feet. Always look in your shoes before you put them on to be sure there are no objects inside.  Do not cross your legs. This may decrease the blood flow to your feet.  If you find a minor scrape,  cut, or break in the skin on your feet, keep it and the skin around it clean and dry. These areas may be cleansed with mild soap and water. Do not cleanse the area with peroxide, alcohol, or iodine.  When you remove an adhesive bandage, be sure not to damage the skin around it.  If you have a wound, look at it several times a day to make sure it is healing.  Do not use heating pads or hot water bottles. They may burn your skin. If you have lost feeling in your feet or legs, you may not know it is happening until it is too late.  Make sure your health care provider performs a complete foot exam at least annually or more often if you have foot problems. Report any cuts, sores, or bruises to your health care provider immediately. SEEK MEDICAL CARE IF:   You have an injury that is not healing.  You have cuts or breaks in the skin.  You have an ingrown nail.  You notice redness on your legs or feet.  You feel burning or tingling in your legs or feet.  You have pain or cramps in your legs and feet.  Your legs or feet are numb.  Your feet always feel cold. SEEK IMMEDIATE MEDICAL CARE IF:   There is increasing redness,   swelling, or pain in or around a wound.  There is a red line that goes up your leg.  Pus is coming from a wound.  You develop a fever or as directed by your health care provider.  You notice a bad smell coming from an ulcer or wound. Document Released: 12/21/1999 Document Revised: 08/25/2012 Document Reviewed: 06/01/2012 [employer] Patient Information 2014 [employer].  

## 2013-01-19 NOTE — Progress Notes (Signed)
HPI:  Patient presents today for follow up of foot and nail care. The patient is diabetic and is having pain from her bunion and hammertoe on the left foot. She also relates pain with ambulation and in shoe gear. She is diabetic neuropathy and she relates it's time to get her new diabetic shoes ordered.  Objective:  Patients chart is reviewed.  Neurovascular status unchanged she has diabetic neuropathy present with the absence of sensation to [employer] monofilament at 2/5 sites bilateral. Light touch and vibratory sensation are decreased. The patient has a hammertoe second digit left and bunion left as well. Patient relates they are painful and uncomfortable..  Patients nails are thickened, discolored, distrophic, friable and brittle with yellow-brown discoloration. Patient subjectively relates they are painful with shoes and with ambulation of bilateral feet.  Assessment: Bunion and hammertoe left, diabetes with neuropathy, Symptomatic onychomycosis  Plan:  Discussed treatment options and alternatives.  Recommended Voltaren gel for the bunion and hammertoe on the left foot as well as diabetic shoes to offload the area of discomfort. We will seek approval for the diabetic shoes and contact her when these are ready for measuring. The symptomatic toenails were debrided through manual an mechanical means without complication.  Return appointment recommended at routine intervals of 3 months    Trudie Buckler, DPM

## 2013-01-21 DIAGNOSIS — M545 Low back pain, unspecified: Secondary | ICD-10-CM | POA: Diagnosis not present

## 2013-01-21 DIAGNOSIS — M2550 Pain in unspecified joint: Secondary | ICD-10-CM | POA: Diagnosis not present

## 2013-01-21 DIAGNOSIS — M25569 Pain in unspecified knee: Secondary | ICD-10-CM | POA: Diagnosis not present

## 2013-01-21 DIAGNOSIS — E785 Hyperlipidemia, unspecified: Secondary | ICD-10-CM | POA: Diagnosis not present

## 2013-02-02 DIAGNOSIS — M545 Low back pain, unspecified: Secondary | ICD-10-CM | POA: Diagnosis not present

## 2013-02-02 DIAGNOSIS — M2550 Pain in unspecified joint: Secondary | ICD-10-CM | POA: Diagnosis not present

## 2013-02-02 DIAGNOSIS — M25569 Pain in unspecified knee: Secondary | ICD-10-CM | POA: Diagnosis not present

## 2013-02-03 ENCOUNTER — Telehealth: Payer: Self-pay | Admitting: *Deleted

## 2013-02-03 DIAGNOSIS — K5730 Diverticulosis of large intestine without perforation or abscess without bleeding: Secondary | ICD-10-CM | POA: Diagnosis not present

## 2013-02-03 DIAGNOSIS — E119 Type 2 diabetes mellitus without complications: Secondary | ICD-10-CM | POA: Diagnosis not present

## 2013-02-03 DIAGNOSIS — B356 Tinea cruris: Secondary | ICD-10-CM | POA: Diagnosis not present

## 2013-02-03 DIAGNOSIS — D1802 Hemangioma of intracranial structures: Secondary | ICD-10-CM | POA: Diagnosis not present

## 2013-02-03 DIAGNOSIS — M069 Rheumatoid arthritis, unspecified: Secondary | ICD-10-CM | POA: Diagnosis not present

## 2013-02-03 DIAGNOSIS — J301 Allergic rhinitis due to pollen: Secondary | ICD-10-CM | POA: Diagnosis not present

## 2013-02-03 DIAGNOSIS — D638 Anemia in other chronic diseases classified elsewhere: Secondary | ICD-10-CM | POA: Diagnosis not present

## 2013-02-03 NOTE — Telephone Encounter (Signed)
Pt asked the status of her paperwork for diabetic shoes from Dr Buddy Duty.

## 2013-02-04 ENCOUNTER — Other Ambulatory Visit (HOSPITAL_COMMUNITY): Payer: Self-pay

## 2013-02-04 DIAGNOSIS — M545 Low back pain, unspecified: Secondary | ICD-10-CM | POA: Diagnosis not present

## 2013-02-04 DIAGNOSIS — M2550 Pain in unspecified joint: Secondary | ICD-10-CM | POA: Diagnosis not present

## 2013-02-04 DIAGNOSIS — M25569 Pain in unspecified knee: Secondary | ICD-10-CM | POA: Diagnosis not present

## 2013-02-07 ENCOUNTER — Inpatient Hospital Stay (HOSPITAL_COMMUNITY): Admission: RE | Admit: 2013-02-07 | Payer: Medicare Other | Source: Ambulatory Visit

## 2013-02-08 ENCOUNTER — Telehealth: Payer: Self-pay | Admitting: *Deleted

## 2013-02-08 DIAGNOSIS — M545 Low back pain, unspecified: Secondary | ICD-10-CM | POA: Diagnosis not present

## 2013-02-08 DIAGNOSIS — M25569 Pain in unspecified knee: Secondary | ICD-10-CM | POA: Diagnosis not present

## 2013-02-08 DIAGNOSIS — M2550 Pain in unspecified joint: Secondary | ICD-10-CM | POA: Diagnosis not present

## 2013-02-08 NOTE — Telephone Encounter (Signed)
Pt asked if the diabetic shoe paper work has been sent to her Dr Buddy Duty. I left a message stating the paperwork is sent through [facility] then to her doctor.

## 2013-02-09 ENCOUNTER — Ambulatory Visit (HOSPITAL_COMMUNITY)
Admission: RE | Admit: 2013-02-09 | Discharge: 2013-02-09 | Disposition: A | Discharge status: Home / Self Care | Payer: Medicare Other | Source: Ambulatory Visit | Attending: Rheumatology | Admitting: Rheumatology

## 2013-02-09 ENCOUNTER — Other Ambulatory Visit (HOSPITAL_COMMUNITY): Payer: Self-pay | Admitting: Rheumatology

## 2013-02-09 DIAGNOSIS — R059 Cough, unspecified: Secondary | ICD-10-CM | POA: Diagnosis not present

## 2013-02-09 DIAGNOSIS — Z79899 Other long term (current) drug therapy: Secondary | ICD-10-CM | POA: Diagnosis not present

## 2013-02-09 DIAGNOSIS — M069 Rheumatoid arthritis, unspecified: Secondary | ICD-10-CM | POA: Diagnosis not present

## 2013-02-09 DIAGNOSIS — M1A00X0 Idiopathic chronic gout, unspecified site, without tophus (tophi): Secondary | ICD-10-CM | POA: Diagnosis not present

## 2013-02-09 DIAGNOSIS — M5137 Other intervertebral disc degeneration, lumbosacral region: Secondary | ICD-10-CM | POA: Diagnosis not present

## 2013-02-09 DIAGNOSIS — R079 Chest pain, unspecified: Secondary | ICD-10-CM | POA: Diagnosis not present

## 2013-02-09 DIAGNOSIS — Z09 Encounter for follow-up examination after completed treatment for conditions other than malignant neoplasm: Secondary | ICD-10-CM | POA: Diagnosis not present

## 2013-02-09 DIAGNOSIS — M109 Gout, unspecified: Secondary | ICD-10-CM | POA: Diagnosis not present

## 2013-02-09 DIAGNOSIS — M2550 Pain in unspecified joint: Secondary | ICD-10-CM | POA: Diagnosis not present

## 2013-02-09 DIAGNOSIS — R52 Pain, unspecified: Secondary | ICD-10-CM

## 2013-02-09 DIAGNOSIS — R05 Cough: Secondary | ICD-10-CM | POA: Insufficient documentation

## 2013-02-09 NOTE — Telephone Encounter (Signed)
Please let pt know we have not received paperwork for her diabetic shoes yet.

## 2013-02-11 ENCOUNTER — Other Ambulatory Visit (HOSPITAL_COMMUNITY): Payer: Self-pay | Admitting: *Deleted

## 2013-02-11 DIAGNOSIS — M545 Low back pain, unspecified: Secondary | ICD-10-CM | POA: Diagnosis not present

## 2013-02-11 DIAGNOSIS — M2550 Pain in unspecified joint: Secondary | ICD-10-CM | POA: Diagnosis not present

## 2013-02-11 DIAGNOSIS — M25569 Pain in unspecified knee: Secondary | ICD-10-CM | POA: Diagnosis not present

## 2013-02-11 DIAGNOSIS — L259 Unspecified contact dermatitis, unspecified cause: Secondary | ICD-10-CM | POA: Diagnosis not present

## 2013-02-14 ENCOUNTER — Encounter (HOSPITAL_COMMUNITY)
Admission: RE | Admit: 2013-02-14 | Discharge: 2013-02-14 | Disposition: A | Discharge status: Home / Self Care | Payer: Medicare Other | Source: Ambulatory Visit | Attending: Rheumatology | Admitting: Rheumatology

## 2013-02-14 DIAGNOSIS — M069 Rheumatoid arthritis, unspecified: Secondary | ICD-10-CM | POA: Insufficient documentation

## 2013-02-14 MED ORDER — SODIUM CHLORIDE 0.9 % IV SOLN
500.0000 mg | INTRAVENOUS | Status: DC
  Administered 2013-02-14: 13:00:00 500 mg via INTRAVENOUS
  Filled 2013-02-14: qty 50

## 2013-02-14 MED ORDER — ACETAMINOPHEN 325 MG PO TABS: 650.0000 mg | ORAL_TABLET | ORAL | Status: DC

## 2013-02-14 MED ORDER — SODIUM CHLORIDE 0.9 % IV SOLN
INTRAVENOUS | Status: DC
  Administered 2013-02-14: 13:00:00 via INTRAVENOUS

## 2013-02-16 DIAGNOSIS — M2550 Pain in unspecified joint: Secondary | ICD-10-CM | POA: Diagnosis not present

## 2013-02-16 DIAGNOSIS — M25569 Pain in unspecified knee: Secondary | ICD-10-CM | POA: Diagnosis not present

## 2013-02-16 DIAGNOSIS — M545 Low back pain, unspecified: Secondary | ICD-10-CM | POA: Diagnosis not present

## 2013-02-16 DIAGNOSIS — E039 Hypothyroidism, unspecified: Secondary | ICD-10-CM | POA: Diagnosis not present

## 2013-02-16 DIAGNOSIS — E669 Obesity, unspecified: Secondary | ICD-10-CM | POA: Diagnosis not present

## 2013-02-16 DIAGNOSIS — E1142 Type 2 diabetes mellitus with diabetic polyneuropathy: Secondary | ICD-10-CM | POA: Diagnosis not present

## 2013-02-16 DIAGNOSIS — E785 Hyperlipidemia, unspecified: Secondary | ICD-10-CM | POA: Diagnosis not present

## 2013-02-16 DIAGNOSIS — E1149 Type 2 diabetes mellitus with other diabetic neurological complication: Secondary | ICD-10-CM | POA: Diagnosis not present

## 2013-02-21 DIAGNOSIS — M545 Low back pain, unspecified: Secondary | ICD-10-CM | POA: Diagnosis not present

## 2013-02-21 DIAGNOSIS — IMO0002 Reserved for concepts with insufficient information to code with codable children: Secondary | ICD-10-CM | POA: Diagnosis not present

## 2013-02-21 DIAGNOSIS — M2550 Pain in unspecified joint: Secondary | ICD-10-CM | POA: Diagnosis not present

## 2013-02-21 DIAGNOSIS — E669 Obesity, unspecified: Secondary | ICD-10-CM | POA: Diagnosis not present

## 2013-02-21 DIAGNOSIS — M25569 Pain in unspecified knee: Secondary | ICD-10-CM | POA: Diagnosis not present

## 2013-02-21 DIAGNOSIS — M5137 Other intervertebral disc degeneration, lumbosacral region: Secondary | ICD-10-CM | POA: Diagnosis not present

## 2013-02-25 ENCOUNTER — Ambulatory Visit: Payer: Medicare Other | Admitting: *Deleted

## 2013-02-25 ENCOUNTER — Other Ambulatory Visit: Payer: Medicare Other

## 2013-02-25 DIAGNOSIS — E119 Type 2 diabetes mellitus without complications: Secondary | ICD-10-CM

## 2013-02-25 NOTE — Progress Notes (Signed)
Pt presents for measurement for diabetic shoes.

## 2013-02-28 ENCOUNTER — Encounter (HOSPITAL_COMMUNITY): Payer: Medicare Other

## 2013-02-28 DIAGNOSIS — M545 Low back pain, unspecified: Secondary | ICD-10-CM | POA: Diagnosis not present

## 2013-02-28 DIAGNOSIS — M2550 Pain in unspecified joint: Secondary | ICD-10-CM | POA: Diagnosis not present

## 2013-02-28 DIAGNOSIS — M25569 Pain in unspecified knee: Secondary | ICD-10-CM | POA: Diagnosis not present

## 2013-03-07 DIAGNOSIS — M5380 Other specified dorsopathies, site unspecified: Secondary | ICD-10-CM | POA: Diagnosis not present

## 2013-03-08 DIAGNOSIS — R32 Unspecified urinary incontinence: Secondary | ICD-10-CM | POA: Diagnosis not present

## 2013-03-10 ENCOUNTER — Other Ambulatory Visit: Payer: Self-pay | Admitting: Physician Assistant

## 2013-03-10 DIAGNOSIS — L259 Unspecified contact dermatitis, unspecified cause: Secondary | ICD-10-CM | POA: Diagnosis not present

## 2013-03-10 DIAGNOSIS — M25569 Pain in unspecified knee: Secondary | ICD-10-CM | POA: Diagnosis not present

## 2013-03-10 DIAGNOSIS — M545 Low back pain, unspecified: Secondary | ICD-10-CM | POA: Diagnosis not present

## 2013-03-10 DIAGNOSIS — M2550 Pain in unspecified joint: Secondary | ICD-10-CM | POA: Diagnosis not present

## 2013-03-11 ENCOUNTER — Ambulatory Visit (INDEPENDENT_AMBULATORY_CARE_PROVIDER_SITE_OTHER): Payer: Medicare Other | Admitting: Podiatrist

## 2013-03-11 ENCOUNTER — Encounter: Payer: Self-pay | Admitting: Podiatrist

## 2013-03-11 VITALS — BP 154/88 | HR 78 | Resp 18

## 2013-03-11 DIAGNOSIS — L97509 Non-pressure chronic ulcer of other part of unspecified foot with unspecified severity: Secondary | ICD-10-CM | POA: Diagnosis not present

## 2013-03-11 MED ORDER — SILVER SULFADIAZINE 1 % EX CREA: 1.0000 "application " | TOPICAL_CREAM | Freq: Every day | CUTANEOUS | Status: DC

## 2013-03-11 MED ORDER — CLINDAMYCIN HCL 300 MG PO CAPS: 300.0000 mg | ORAL_CAPSULE | Freq: Three times a day (TID) | ORAL | Status: DC

## 2013-03-11 NOTE — Progress Notes (Signed)
Ulcer top of left 2nd toe.  Debride, wrap, antibiotic. Back in 1 week to check  Topicort for skin of right ankle,  Pulse palpable  Chief Complaint  Patient presents with  . Foot Problem    2nd toe on left     HPI: Patient is 73 y.o. female who presents today for a new problem of a sore on the top of the left 2nd toe.  She relates the toe became irritated, sore and tender and she noticed a blister and the toe became red and she noticed drainage.  She is diabetic. She also has irritation on the skin of her right ankle.  Her dermatologist diagnosed her with eczema.      Physical Exam GENERAL APPEARANCE: Alert, conversant. Appropriately groomed. No acute distress.  VASCULAR: Pedal pulses palpable at 2/4 DP and PT bilateral.  Capillary refill time is immediate to all digits,  NEUROLOGIC: sensation is intact epicritically and protectively to 5.07 monofilament at 3/5 sites bilateral.  Light touch is intact bilateral MUSCULOSKELETAL: contracture of left 2nd toe noted.  Otherwise exam unchanged with pes planus foot type DERMATOLOGIC: left 2nd toe has an ulceration that appears to have started with a blister on the top of the toe. No pus or pirulence noted, mild swelling and drainage of the toe noted.  Patchy irritation on the anterior right ankle is present.  Patient relates it is itchy at times  Assessment: ulceration left 2nd toe; eczema right foot  Plan: debrided the ulceration and applied iodosorb and a dressing.  I started the patient on clindamycin and I will see her back in 1 week for a recheck.

## 2013-03-11 NOTE — Progress Notes (Deleted)
° °  Subjective:    Patient ID: [name], female    DOB: 1940-08-29, 73 y.o.   MRN: [id number]  HPI My 2nd toe on my left foot is worse and draining and sore and tender and on my right foot and I did have excema in my arm pit and now I have a rash on my foot and been going on for about 2 weeks    Review of Systems     Objective:   Physical Exam        Assessment & Plan:

## 2013-03-11 NOTE — Patient Instructions (Signed)
Instructions for Wound Care  The most important step to healing a foot wound is to reduce the pressure on your foot - it is extremely important to stay off your foot as much as possible   Cleanse your foot with saline wash or warm soapy water (dial antibacterial soap or similar).  Blot dry.  Apply prescribed medication to your wound and cover with gauze and a bandage.  May hold bandage in place with Coban (self sticky wrap), Ace bandage or tape.  You may find dressing supplies at your local [employer], [employer], drug store or medical supply store.  Your prescribed topical medication is :  Silvadene Cream (twice daily)   If you notice any foul odor, increase in pain, pus, increased swelling, red streaks or generalized redness occurring in your foot or leg-Call our office immediately to be seen.  This may be a sign of a limb or life threatening infection that will need prompt attention.

## 2013-03-18 ENCOUNTER — Encounter: Payer: Self-pay | Admitting: Podiatrist

## 2013-03-18 ENCOUNTER — Ambulatory Visit (INDEPENDENT_AMBULATORY_CARE_PROVIDER_SITE_OTHER): Payer: Medicare Other | Admitting: Podiatrist

## 2013-03-18 VITALS — BP 99/66 | HR 74 | Resp 12

## 2013-03-18 DIAGNOSIS — L97509 Non-pressure chronic ulcer of other part of unspecified foot with unspecified severity: Secondary | ICD-10-CM

## 2013-03-18 MED ORDER — SILVER SULFADIAZINE 1 % EX CREA: 1.0000 "application " | TOPICAL_CREAM | Freq: Every day | CUTANEOUS | Status: DC

## 2013-03-18 NOTE — Patient Instructions (Signed)
Silvadene cream has been called into your pharmacy for you-- [facility].  Start applying it to the toe every day and cover with a light bandage.

## 2013-03-21 ENCOUNTER — Encounter (HOSPITAL_COMMUNITY): Payer: Medicare Other

## 2013-03-21 ENCOUNTER — Encounter: Payer: Self-pay | Admitting: Cardiology

## 2013-03-21 NOTE — Progress Notes (Signed)
S:  Patient presents today for follow up of ulcer 2nd digit left foot and exczema on the right foot.  She relates she has noticed improvement in both areas.  She relates the redness has decreased in the left 2nd toe and she has been following wound care instructions.  O:  Vascular status intact at 2/4 dp and pt bilateral.  Neurological sensation unchanged and decreased to distal digits.  Ulceration present but improved dorsal pipj 2nd toe.  No redness, swelling or localized signs of infection present.  Eczema is present but not as bothersome as last visit.  A:  Ulcer toe left 2nd  Plan:  Recommended continued ulcer care of the left 2nd toe with antibiotic and dressing changes daily.  I lightly debrided the lesion today and it appears to be more superficial than the last visit.  I will check the toe again in 2 weeks.  She is also to continue wearing the offloading shoe and remoes as much pressure off the toe as possible.

## 2013-03-24 DIAGNOSIS — M5137 Other intervertebral disc degeneration, lumbosacral region: Secondary | ICD-10-CM | POA: Diagnosis not present

## 2013-03-24 DIAGNOSIS — Z09 Encounter for follow-up examination after completed treatment for conditions other than malignant neoplasm: Secondary | ICD-10-CM | POA: Diagnosis not present

## 2013-03-24 DIAGNOSIS — M069 Rheumatoid arthritis, unspecified: Secondary | ICD-10-CM | POA: Diagnosis not present

## 2013-03-24 DIAGNOSIS — M1A00X0 Idiopathic chronic gout, unspecified site, without tophus (tophi): Secondary | ICD-10-CM | POA: Diagnosis not present

## 2013-03-25 ENCOUNTER — Ambulatory Visit (INDEPENDENT_AMBULATORY_CARE_PROVIDER_SITE_OTHER): Payer: Medicare Other | Admitting: Podiatrist

## 2013-03-25 ENCOUNTER — Encounter: Payer: Self-pay | Admitting: Podiatrist

## 2013-03-25 ENCOUNTER — Other Ambulatory Visit (HOSPITAL_COMMUNITY): Payer: Self-pay | Admitting: *Deleted

## 2013-03-25 VITALS — BP 128/82 | HR 84 | Resp 16

## 2013-03-25 DIAGNOSIS — E119 Type 2 diabetes mellitus without complications: Secondary | ICD-10-CM

## 2013-03-25 DIAGNOSIS — L97509 Non-pressure chronic ulcer of other part of unspecified foot with unspecified severity: Secondary | ICD-10-CM

## 2013-03-25 NOTE — Progress Notes (Signed)
S: Patient presents today for follow up of ulcer 2nd digit left foot and exczema on the right foot. She states that the ulcer on the second toe has improved however the eczema has not. She has new areas of patchy eczema on bilateral feet and legs. She has seen her dermatologist who recommended continued use of silver topical therapy. She also relates the redness and pain has decreased in the left 2nd toe and she has been following wound care instructions.   O: Vascular status intact at 2/4 dp and pt bilateral. Neurological sensation unchanged and decreased to distal digits. Ulceration present but improved dorsal pipj 2nd toe. No redness, swelling or localized signs of infection present. Eczema is present and appears to have spreading comparison to last visit.   A: Ulcer toe left 2nd eczema  Plan: Recommended continued ulcer care of the left 2nd toe with antibiotic and dressing changes daily. I lightly debrided the lesion today and it appears to be more superficial than the last visit. I will check the toe again in 3 weeks. She is also to continue wearing the offloading shoe and remoes as much pressure off the toe as possible.

## 2013-03-28 ENCOUNTER — Encounter (HOSPITAL_COMMUNITY)
Admission: RE | Admit: 2013-03-28 | Discharge: 2013-03-28 | Disposition: A | Discharge status: Home / Self Care | Payer: Medicare Other | Source: Ambulatory Visit | Attending: Rheumatology | Admitting: Rheumatology

## 2013-03-28 DIAGNOSIS — M069 Rheumatoid arthritis, unspecified: Secondary | ICD-10-CM | POA: Diagnosis not present

## 2013-03-28 LAB — COMPREHENSIVE METABOLIC PANEL
ALBUMIN: 3.9 g/dL (ref 3.5–5.2)
ALT: 20 U/L (ref 0–35)
AST: 24 U/L (ref 0–37)
Alkaline Phosphatase: 101 U/L (ref 39–117)
BILIRUBIN TOTAL: 0.4 mg/dL (ref 0.3–1.2)
BUN: 19 mg/dL (ref 6–23)
CHLORIDE: 101 meq/L (ref 96–112)
CO2: 20 mEq/L (ref 19–32)
Calcium: 9.4 mg/dL (ref 8.4–10.5)
Creatinine, Ser: 1.1 mg/dL (ref 0.50–1.10)
GFR calc Af Amer: 56 mL/min — ABNORMAL LOW (ref >90)
GFR calc non Af Amer: 48 mL/min — ABNORMAL LOW (ref >90)
Glucose, Bld: 140 mg/dL — ABNORMAL HIGH (ref 70–99)
POTASSIUM: 3.9 meq/L (ref 3.7–5.3)
Sodium: 138 mEq/L (ref 137–147)
TOTAL PROTEIN: 7.9 g/dL (ref 6.0–8.3)

## 2013-03-28 LAB — DIFFERENTIAL
Basophils Absolute: 0 10*3/uL (ref 0.0–0.1)
Basophils Relative: 0 % (ref 0–1)
Eosinophils Absolute: 0.3 10*3/uL (ref 0.0–0.7)
Eosinophils Relative: 4 % (ref 0–5)
LYMPHS ABS: 4.1 10*3/uL — AB (ref 0.7–4.0)
Lymphocytes Relative: 61 % — ABNORMAL HIGH (ref 12–46)
MONO ABS: 0.6 10*3/uL (ref 0.1–1.0)
MONOS PCT: 9 % (ref 3–12)
NEUTROS PCT: 26 % — AB (ref 43–77)
Neutro Abs: 1.8 10*3/uL (ref 1.7–7.7)

## 2013-03-28 LAB — CBC
HEMATOCRIT: 30.5 % — AB (ref 36.0–46.0)
Hemoglobin: 10.5 g/dL — ABNORMAL LOW (ref 12.0–15.0)
MCH: 29.5 pg (ref 26.0–34.0)
MCHC: 34.4 g/dL (ref 30.0–36.0)
MCV: 85.7 fL (ref 78.0–100.0)
PLATELETS: 245 10*3/uL (ref 150–400)
RBC: 3.56 MIL/uL — ABNORMAL LOW (ref 3.87–5.11)
RDW: 14.2 % (ref 11.5–15.5)
WBC: 6.8 10*3/uL (ref 4.0–10.5)

## 2013-03-28 MED ORDER — SODIUM CHLORIDE 0.9 % IV SOLN
750.0000 mg | Freq: Once | INTRAVENOUS | Status: AC
  Administered 2013-03-28: 750 mg via INTRAVENOUS
  Filled 2013-03-28: qty 30

## 2013-03-28 MED ORDER — ACETAMINOPHEN 325 MG PO TABS: 650.0000 mg | ORAL_TABLET | Freq: Once | ORAL | Status: DC

## 2013-03-28 MED ORDER — SODIUM CHLORIDE 0.9 % IV SOLN
Freq: Once | INTRAVENOUS | Status: AC
  Administered 2013-03-28: 12:00:00 via INTRAVENOUS

## 2013-04-01 DIAGNOSIS — T7840XA Allergy, unspecified, initial encounter: Secondary | ICD-10-CM | POA: Diagnosis not present

## 2013-04-01 DIAGNOSIS — L259 Unspecified contact dermatitis, unspecified cause: Secondary | ICD-10-CM | POA: Diagnosis not present

## 2013-04-05 DIAGNOSIS — L259 Unspecified contact dermatitis, unspecified cause: Secondary | ICD-10-CM | POA: Diagnosis not present

## 2013-04-05 DIAGNOSIS — L821 Other seborrheic keratosis: Secondary | ICD-10-CM | POA: Diagnosis not present

## 2013-04-13 ENCOUNTER — Ambulatory Visit (INDEPENDENT_AMBULATORY_CARE_PROVIDER_SITE_OTHER): Payer: Medicare Other | Admitting: Podiatrist

## 2013-04-13 ENCOUNTER — Encounter: Payer: Self-pay | Admitting: Podiatrist

## 2013-04-13 VITALS — BP 134/63 | HR 74 | Temp 98.8°F | Resp 17 | Ht 64.0 in | Wt 189.0 lb

## 2013-04-13 DIAGNOSIS — B351 Tinea unguium: Secondary | ICD-10-CM | POA: Diagnosis not present

## 2013-04-13 DIAGNOSIS — M79609 Pain in unspecified limb: Secondary | ICD-10-CM

## 2013-04-13 DIAGNOSIS — L97509 Non-pressure chronic ulcer of other part of unspecified foot with unspecified severity: Secondary | ICD-10-CM

## 2013-04-13 DIAGNOSIS — E1142 Type 2 diabetes mellitus with diabetic polyneuropathy: Secondary | ICD-10-CM

## 2013-04-13 DIAGNOSIS — E1149 Type 2 diabetes mellitus with other diabetic neurological complication: Secondary | ICD-10-CM | POA: Diagnosis not present

## 2013-04-13 DIAGNOSIS — M2040 Other hammer toe(s) (acquired), unspecified foot: Secondary | ICD-10-CM

## 2013-04-13 DIAGNOSIS — E1140 Type 2 diabetes mellitus with diabetic neuropathy, unspecified: Secondary | ICD-10-CM

## 2013-04-13 MED ORDER — SILVER SULFADIAZINE 1 % EX CREA: 1.0000 "application " | TOPICAL_CREAM | Freq: Every day | CUTANEOUS | Status: DC

## 2013-04-13 NOTE — Progress Notes (Signed)
   Subjective: Patient presents today for followup of left second toe ulcer. She also relates she's been using the Silver cream on her eczema and it is also improved. The patient's toenails are elongated and discomfort and she would like them trimmed as well. Her diabetic shoes were dispensed at the last visit however with her ulceration until her not to start wearing them yet she brings in and for dispensing. Overall she denies any change in general or foot health. Denies any systemic or local signs of infection.  Objective: Neurovascular status is unchanged from her last visit. Contracted hammertoe left second digit is present with a healing ulceration on the dorsal aspect. The eczema is also improved. Her toenails are elongated, thickened, discolored, dystrophic and mycotic. Her diabetic shoes are noted to fit and contour her foot nicely I did pocket the dorsal aspect of the second toe bilaterally to give her room for the hammertoes.  Assessment: Ulcer check, symptomatic mycotic toenails,  Plan: Silvadene cream and a dressing was applied to the left second toe ulcer. Toenails were debrided today without complication. Dispensing of the diabetic shoes was carried out in adjusted accordingly. Prescription for Silvadene cream was prescribed with 2 refills. I'll see her back for her routine care appointment. If the ulceration fails to improve she will call me immediately.

## 2013-04-18 DIAGNOSIS — Z79899 Other long term (current) drug therapy: Secondary | ICD-10-CM | POA: Diagnosis not present

## 2013-04-21 ENCOUNTER — Ambulatory Visit: Payer: Medicare Other | Admitting: Podiatrist

## 2013-04-22 DIAGNOSIS — L259 Unspecified contact dermatitis, unspecified cause: Secondary | ICD-10-CM | POA: Diagnosis not present

## 2013-04-25 ENCOUNTER — Encounter (HOSPITAL_COMMUNITY)
Admission: RE | Admit: 2013-04-25 | Discharge: 2013-04-25 | Disposition: A | Discharge status: Home / Self Care | Payer: Medicare Other | Source: Ambulatory Visit | Attending: Rheumatology | Admitting: Rheumatology

## 2013-04-25 DIAGNOSIS — M069 Rheumatoid arthritis, unspecified: Secondary | ICD-10-CM | POA: Diagnosis not present

## 2013-04-25 MED ORDER — LORATADINE 10 MG PO TABS: 10.0000 mg | ORAL_TABLET | ORAL | Status: DC

## 2013-04-25 MED ORDER — SODIUM CHLORIDE 0.9 % IV SOLN
INTRAVENOUS | Status: DC
  Administered 2013-04-25: 13:00:00 via INTRAVENOUS

## 2013-04-25 MED ORDER — ABATACEPT 250 MG IV SOLR
750.0000 mg | INTRAVENOUS | Status: DC
  Administered 2013-04-25: 750 mg via INTRAVENOUS
  Filled 2013-04-25: qty 30

## 2013-04-25 MED ORDER — ACETAMINOPHEN 325 MG PO TABS
650.0000 mg | ORAL_TABLET | ORAL | Status: DC
Start: 2013-04-25 — End: 2013-04-26

## 2013-04-25 MED ORDER — FAMOTIDINE 20 MG PO TABS: 40.0000 mg | ORAL_TABLET | ORAL | Status: DC

## 2013-05-02 DIAGNOSIS — G479 Sleep disorder, unspecified: Secondary | ICD-10-CM | POA: Diagnosis not present

## 2013-05-02 DIAGNOSIS — K912 Postsurgical malabsorption, not elsewhere classified: Secondary | ICD-10-CM | POA: Diagnosis not present

## 2013-05-02 DIAGNOSIS — IMO0001 Reserved for inherently not codable concepts without codable children: Secondary | ICD-10-CM | POA: Diagnosis not present

## 2013-05-02 DIAGNOSIS — I10 Essential (primary) hypertension: Secondary | ICD-10-CM | POA: Diagnosis not present

## 2013-05-02 DIAGNOSIS — E785 Hyperlipidemia, unspecified: Secondary | ICD-10-CM | POA: Diagnosis not present

## 2013-05-09 DIAGNOSIS — Z79899 Other long term (current) drug therapy: Secondary | ICD-10-CM | POA: Diagnosis not present

## 2013-05-11 DIAGNOSIS — Z09 Encounter for follow-up examination after completed treatment for conditions other than malignant neoplasm: Secondary | ICD-10-CM | POA: Diagnosis not present

## 2013-05-11 DIAGNOSIS — M76899 Other specified enthesopathies of unspecified lower limb, excluding foot: Secondary | ICD-10-CM | POA: Diagnosis not present

## 2013-05-11 DIAGNOSIS — M1A00X0 Idiopathic chronic gout, unspecified site, without tophus (tophi): Secondary | ICD-10-CM | POA: Diagnosis not present

## 2013-05-11 DIAGNOSIS — M5137 Other intervertebral disc degeneration, lumbosacral region: Secondary | ICD-10-CM | POA: Diagnosis not present

## 2013-05-11 DIAGNOSIS — M069 Rheumatoid arthritis, unspecified: Secondary | ICD-10-CM | POA: Diagnosis not present

## 2013-05-17 DIAGNOSIS — L259 Unspecified contact dermatitis, unspecified cause: Secondary | ICD-10-CM | POA: Diagnosis not present

## 2013-05-23 ENCOUNTER — Encounter (HOSPITAL_COMMUNITY): Payer: Medicare Other

## 2013-05-31 DIAGNOSIS — L259 Unspecified contact dermatitis, unspecified cause: Secondary | ICD-10-CM | POA: Diagnosis not present

## 2013-06-06 ENCOUNTER — Ambulatory Visit: Payer: Medicare Other | Admitting: Podiatry

## 2013-06-06 ENCOUNTER — Ambulatory Visit (INDEPENDENT_AMBULATORY_CARE_PROVIDER_SITE_OTHER): Payer: Medicare Other

## 2013-06-06 VITALS — BP 164/83 | HR 74 | Temp 98.0°F | Resp 15 | Ht 64.0 in | Wt 189.0 lb

## 2013-06-06 DIAGNOSIS — L97509 Non-pressure chronic ulcer of other part of unspecified foot with unspecified severity: Secondary | ICD-10-CM | POA: Diagnosis not present

## 2013-06-06 DIAGNOSIS — M2040 Other hammer toe(s) (acquired), unspecified foot: Secondary | ICD-10-CM

## 2013-06-06 DIAGNOSIS — L02619 Cutaneous abscess of unspecified foot: Secondary | ICD-10-CM | POA: Diagnosis not present

## 2013-06-06 DIAGNOSIS — E1140 Type 2 diabetes mellitus with diabetic neuropathy, unspecified: Secondary | ICD-10-CM

## 2013-06-06 DIAGNOSIS — L03039 Cellulitis of unspecified toe: Secondary | ICD-10-CM

## 2013-06-06 MED ORDER — CLINDAMYCIN HCL 300 MG PO CAPS: 300.0000 mg | ORAL_CAPSULE | Freq: Three times a day (TID) | ORAL | Status: DC

## 2013-06-06 NOTE — Patient Instructions (Signed)
ANTIBACTERIAL SOAP INSTRUCTIONS  THE DAY AFTER PROCEDURE  Please follow the instructions your doctor has marked.   Shower as usual. Before getting out, place a drop of antibacterial liquid soap (Dial) on a wet, clean washcloth.  Gently wipe washcloth over affected area.  Afterward, rinse the area with warm water.  Blot the area dry with a soft cloth and cover with antibiotic ointment (neosporin, polysporin, bacitracin) and band aid or gauze and tape  Place 3-4 drops of antibacterial liquid soap in a quart of warm tap water.  Submerge foot into water for 20 minutes.  If bandage was applied after your procedure, leave on to allow for easy lift off, then remove and continue with soak for the remaining time.  Next, blot area dry with a soft cloth and cover with a bandage.  Apply other medications as directed by your doctor, such as cortisporin otic solution (eardrops) or neosporin antibiotic ointment  After soaking the toe daily as instructed in warm antibacterial soap and water applying Silvadene and gauze dressing. Maintain Darco open toe surgical shoe at all times followup in one week for reevaluation of abscess/ulcer second toe

## 2013-06-06 NOTE — Addendum Note (Signed)
Addended by: Lolita Rieger on: 06/06/2013 04:28 PM   Modules accepted: Orders

## 2013-06-06 NOTE — Progress Notes (Signed)
   Subjective:    Patient ID: [name], female    DOB: 1940-10-17, 73 y.o.   MRN: [id number]  HPI Comments: Pt states the left 2nd toe is painful, and appears darkened.     Review of Systems no new systemic changes or findings noted    Objective:   Physical Exam Neurovascular status is intact and unchanged pedal pulses palpable DP postal for PT plus one over 4 bilateral there is profound decreased epicritic sensation Semmes Weinstein testing to toes plantar forefoot arch patient semirigid digital contractures 2 through 4 and 5 second bilateral most severe with hemorrhage a keratoses and macerated keratotic lesion and darkening discoloration second toe left foot there is no ascending psoas lymphangitis local edema and erythema and hyperpigmentation of the second toe noted on debridement of the overlying eschar tissue there is a pro discharge drainage from an ulceration down to subcutaneous tissue level where the second digit proximal IP joint left foot. Remainder of exam unremarkable digital contractures noted no other new changes medication her other health status. Patient is been wearing her diabetic shoes however is not wearing socks with her shoes stressed the patient she is to wear socks and shoes at all times no barefoot no flimsy shoes no flip-flops diabetic shoes and socks or slippers and socks however this time during the ulcer. Will use her Darco shoe as instructed.       Assessment & Plan:  Assessment this time his diabetes with peripheral neuropathy hammertoe deformity with associated new ulcer and abscess second toe left foot I&D is carried out this time culture and sensitivity obtained patient placed a regimen of clindamycin 3 mg 3 times a day also Silvadene cream which your he has at home will do daily cleansing with soap and water as instructed apply Silvadene gauze dressing and offloaded toe with accommodative shoe gear. Recheck in one week for followup monitor for any  fever chills exacerbations if it gets worse in anyway whatsoever contact us immediately go to the emergency room as instructed reappointed one week for followup  Harriet Masson DPM

## 2013-06-13 ENCOUNTER — Encounter: Payer: Self-pay | Admitting: Podiatrist

## 2013-06-13 ENCOUNTER — Ambulatory Visit (INDEPENDENT_AMBULATORY_CARE_PROVIDER_SITE_OTHER): Payer: Medicare Other | Admitting: Podiatrist

## 2013-06-13 VITALS — BP 177/72 | HR 70 | Temp 97.5°F | Resp 16 | Ht 64.0 in | Wt 189.0 lb

## 2013-06-13 DIAGNOSIS — L97509 Non-pressure chronic ulcer of other part of unspecified foot with unspecified severity: Secondary | ICD-10-CM | POA: Diagnosis not present

## 2013-06-13 DIAGNOSIS — M2040 Other hammer toe(s) (acquired), unspecified foot: Secondary | ICD-10-CM

## 2013-06-13 DIAGNOSIS — L03039 Cellulitis of unspecified toe: Principal | ICD-10-CM

## 2013-06-13 DIAGNOSIS — L02619 Cutaneous abscess of unspecified foot: Secondary | ICD-10-CM

## 2013-06-13 NOTE — Progress Notes (Signed)
Subjective: Patient presents today for followup of left second toe ulcer. She had a flareup and recently had to see Dr. Blenda Mounts as it became inflamed and infected. She also relates she's been using the Silver cream on her eczema and it is also improved. Overall she states that the toe is improving again. Denies any systemic or local signs of infection.   Objective: Neurovascular status is unchanged from her last visit. Contracted hammertoe left second digit is present with a healing ulceration on the dorsal aspect. No sign of infection or abscess is present.  Assessment: Ulcer left second toe dorsally  Plan: Offloading pad with Silvadene cream and a dressing was applied to the left second toe ulcer. We discussed surgical correction of the left second toe considering ulceration continues to return. She has a convention  in July and I recommended she wait till after this to get the toe fixed. Recommended she continue to wear her surgical sandal in the meantime. We'll call if she has any problems, concerns or signs of infection arise.

## 2013-06-13 NOTE — Patient Instructions (Signed)
Hammer Toes Hammer toes is a condition in which one or more of your toes is permanently flexed. CAUSES  This happens when a muscle imbalance or abnormal bone length makes your small toes buckle. This causes the toe joint to contract and the strong cord-like bands that attach muscles to the bones (tendons) in your toes to shorten.  SIGNS AND SYMPTOMS  Common symptoms of flexible hammer toes include:   A build up of skin cells (Corns). Corns occur where boney bumps come in frequent contact with hard surfaces. For example, where your shoes press and rub.  Irritation.  Inflammation.  Pain.  Limited motion in your toes DIAGNOSIS  Hammer toes are diagnosed through a physical exam of your toes.During the exam, your health care provider may try to reproduce your symptoms by manipulating your foot. Often, x-ray exams are done to determine the degree of deformity and to make sure that the cause is not a fracture.  TREATMENT  Hammer toes can be treated with corrective surgery. There are several types of surgical procedures that can treat hammer toes. The most common procedures include:  Arthroplasty A portion of the joint is surgically removed and your toe is straightened. The gap fills in with fibrous tissue. This procedure helps treat pain and deformity and helps restore function.  Fusion Cartilage between the two bones of the affected joint is taken out and the bones fuse together into one longer bone. This helps keep your toe stable and reduces pain but leaves your toe stiff, yet straight.  Implantation A portion of your bone is removed and replaced with an implant to restore motion.  Flexor tendon transfers This procedure repositions the tendons that curl the toes down (flexor tendons). This may be done to release the deforming force that causes your toe to buckle. Several of these procedures require fixing your toe with a pin that is visible at the tip of your toe. The pin keeps the toe  straight during healing. Your health care provider will remove the pin usually within 4 8 weeks after the procedure.  Document Released: 12/21/1999 Document Revised: 10/13/2012 Document Reviewed: 08/30/2012 [employer] Patient Information 2014 [employer].  

## 2013-06-19 ENCOUNTER — Emergency Department (HOSPITAL_COMMUNITY)
Admission: EM | Admit: 2013-06-19 | Discharge: 2013-06-19 | Disposition: A | Discharge status: Home / Self Care | Payer: Medicare Other | Attending: Emergency Medicine | Admitting: Emergency Medicine

## 2013-06-19 ENCOUNTER — Emergency Department (HOSPITAL_COMMUNITY): Payer: Medicare Other

## 2013-06-19 ENCOUNTER — Encounter (HOSPITAL_COMMUNITY): Payer: Self-pay | Admitting: Emergency Medicine

## 2013-06-19 DIAGNOSIS — R05 Cough: Secondary | ICD-10-CM | POA: Diagnosis not present

## 2013-06-19 DIAGNOSIS — E119 Type 2 diabetes mellitus without complications: Secondary | ICD-10-CM | POA: Insufficient documentation

## 2013-06-19 DIAGNOSIS — Z8719 Personal history of other diseases of the digestive system: Secondary | ICD-10-CM | POA: Insufficient documentation

## 2013-06-19 DIAGNOSIS — Z880 Allergy status to penicillin: Secondary | ICD-10-CM | POA: Insufficient documentation

## 2013-06-19 DIAGNOSIS — M069 Rheumatoid arthritis, unspecified: Secondary | ICD-10-CM | POA: Insufficient documentation

## 2013-06-19 DIAGNOSIS — M1990 Unspecified osteoarthritis, unspecified site: Secondary | ICD-10-CM | POA: Insufficient documentation

## 2013-06-19 DIAGNOSIS — E7800 Pure hypercholesterolemia, unspecified: Secondary | ICD-10-CM | POA: Diagnosis not present

## 2013-06-19 DIAGNOSIS — Z87891 Personal history of nicotine dependence: Secondary | ICD-10-CM | POA: Diagnosis not present

## 2013-06-19 DIAGNOSIS — Z792 Long term (current) use of antibiotics: Secondary | ICD-10-CM | POA: Insufficient documentation

## 2013-06-19 DIAGNOSIS — Z79899 Other long term (current) drug therapy: Secondary | ICD-10-CM | POA: Insufficient documentation

## 2013-06-19 DIAGNOSIS — R059 Cough, unspecified: Secondary | ICD-10-CM | POA: Diagnosis not present

## 2013-06-19 DIAGNOSIS — J029 Acute pharyngitis, unspecified: Secondary | ICD-10-CM | POA: Diagnosis not present

## 2013-06-19 DIAGNOSIS — E039 Hypothyroidism, unspecified: Secondary | ICD-10-CM | POA: Insufficient documentation

## 2013-06-19 DIAGNOSIS — G8929 Other chronic pain: Secondary | ICD-10-CM | POA: Insufficient documentation

## 2013-06-19 DIAGNOSIS — J020 Streptococcal pharyngitis: Secondary | ICD-10-CM | POA: Diagnosis not present

## 2013-06-19 DIAGNOSIS — I129 Hypertensive chronic kidney disease with stage 1 through stage 4 chronic kidney disease, or unspecified chronic kidney disease: Secondary | ICD-10-CM | POA: Insufficient documentation

## 2013-06-19 DIAGNOSIS — Z791 Long term (current) use of non-steroidal anti-inflammatories (NSAID): Secondary | ICD-10-CM | POA: Diagnosis not present

## 2013-06-19 DIAGNOSIS — R6889 Other general symptoms and signs: Secondary | ICD-10-CM | POA: Insufficient documentation

## 2013-06-19 DIAGNOSIS — E785 Hyperlipidemia, unspecified: Secondary | ICD-10-CM | POA: Diagnosis not present

## 2013-06-19 DIAGNOSIS — R0989 Other specified symptoms and signs involving the circulatory and respiratory systems: Secondary | ICD-10-CM

## 2013-06-19 DIAGNOSIS — D649 Anemia, unspecified: Secondary | ICD-10-CM | POA: Diagnosis not present

## 2013-06-19 DIAGNOSIS — N183 Chronic kidney disease, stage 3 unspecified: Secondary | ICD-10-CM | POA: Diagnosis not present

## 2013-06-19 DIAGNOSIS — T17208A Unspecified foreign body in pharynx causing other injury, initial encounter: Secondary | ICD-10-CM | POA: Diagnosis not present

## 2013-06-19 DIAGNOSIS — Z8669 Personal history of other diseases of the nervous system and sense organs: Secondary | ICD-10-CM | POA: Diagnosis not present

## 2013-06-19 DIAGNOSIS — T18108A Unspecified foreign body in esophagus causing other injury, initial encounter: Secondary | ICD-10-CM | POA: Diagnosis not present

## 2013-06-19 DIAGNOSIS — I10 Essential (primary) hypertension: Secondary | ICD-10-CM | POA: Diagnosis not present

## 2013-06-19 LAB — RAPID STREP SCREEN (MED CTR MEBANE ONLY): Streptococcus, Group A Screen (Direct): POSITIVE — AB

## 2013-06-19 MED ORDER — AZITHROMYCIN 250 MG PO TABS: 250.0000 mg | ORAL_TABLET | Freq: Every day | ORAL | Status: DC

## 2013-06-19 MED ORDER — GI COCKTAIL ~~LOC~~
30.0000 mL | Freq: Once | ORAL | Status: AC
  Administered 2013-06-19: 30 mL via ORAL
  Filled 2013-06-19: qty 30

## 2013-06-19 NOTE — ED Notes (Signed)
Pt was seen at [facility] for sensation of foreign body in throat.  Pt woke up @ 3 am today with this sensation.  Pt has small palpable lump in right neck which she says is where sensation is.  Pt's oropharynx negative for swelling or redness.  Pt denies SOB, stridor, N/V/D and fever.  Pt's lung sounds are clear.

## 2013-06-19 NOTE — ED Provider Notes (Signed)
CSN: [id number]     Arrival date & time 06/19/13  1118 History   First MD Initiated Contact with Patient 06/19/13 1147     Chief Complaint  Patient presents with  . Sore Throat     (Consider location/radiation/quality/duration/timing/severity/associated sxs/prior Treatment) HPI Pt is a 73yo female sent to ED from [facility] for further evaluation and tx of foreign body sensation.  Pt states she experienced a sharp pain in the center and right side of her throat around 0300 this morning.  Pt states she was not eating anything at that time. Reports eating chicken last night but denies choking during dinner.  Pt also reports coughing up mucus that is blood tinged. Denies fever, n/v/d. Pt does have hx of strep pharyngitis but also states she had a fish bone stuck in her throat before that had to be removed with forceps. Pt states when fish bone was in throat however, that incident occurred while pt was eating, pt sought medical attention immediately.  Denies difficulty breathing. States she has been able swallow water this morning. Denies congestion, or chest pain.   Past Medical History  Diagnosis Date  . Rheumatoid arthritis(714.0)   . Glaucoma   . HTN (hypertension)   . Hypothyroidism   . Diabetes mellitus   . Hypercholesteremia   . Chronic pain   . Allergic rhinitis   . Peripheral neuropathy   . Osteoarthritis   . Diverticulosis   . Hemorrhoids     internal  . PPD positive     6 months ago  . Anemia   . Sickle cell trait   . Meningioma 2008  . CKD (chronic kidney disease), stage III   . Aortic stenosis 12/22/2012    Mild 3/14-2.35 m/s peak velocity  . Hyperlipidemia 12/22/2012  . PVC (premature ventricular contraction) 12/22/2012   Past Surgical History  Procedure Laterality Date  . Joint replacement  2001    left TKR  . Fistula repair  03/2010    partial colectomy  . Intracranial surgery  11/2007    removal of meningioma  . Total thyroidectomy  1992  .  Rotator cuff repair  1999    BIlateral  . Sigmoidectomy  2012  . Abdominal hysterectomy  1978    TAH,& BSO 1  . Tonsillectomy and adenoidectomy    . Wrist sx      right    Family History  Problem Relation Age of Onset  . Hypotension Mother   . CVA Mother   . Diabetes Mother   . Hypertension Father   . Kidney failure Father    History  Substance Use Topics  . Smoking status: Former Smoker    Quit date: 01/06/1962  . Smokeless tobacco: Not on file  . Alcohol Use: Not on file   OB History   Grav Para Term Preterm Abortions TAB SAB Ect Mult Living                 Review of Systems  Constitutional: Negative for fever and chills.  HENT: Positive for sore throat. Negative for congestion, trouble swallowing and voice change.   Respiratory: Positive for cough. Negative for choking, chest tightness and shortness of breath.   Cardiovascular: Negative for chest pain and palpitations.  Gastrointestinal: Negative for nausea, vomiting, abdominal pain, diarrhea and constipation.  All other systems reviewed and are negative.     Allergies  Penicillins; Other; and Zocor  Home Medications   Prior to Admission medications   Medication  Sig Start Date End Date Taking? Authorizing Provider  allopurinol (ZYLOPRIM) 300 MG tablet Take 300 mg by mouth daily.    Historical Provider, MD  atorvastatin (LIPITOR) 40 MG tablet Take 40 mg by mouth daily.    Historical Provider, MD  azithromycin (ZITHROMAX) 250 MG tablet Take 1 tablet (250 mg total) by mouth daily. Take first 2 tablets together, then 1 every day until finished. 06/19/13   Noland Fordyce, PA-C  cholecalciferol (VITAMIN D) 1000 UNITS tablet Take 400 Units by mouth daily. 2 capsule    Historical Provider, MD  clindamycin (CLEOCIN) 300 MG capsule Take 1 capsule (300 mg total) by mouth 3 (three) times daily. 06/06/13   Harriet Masson, DPM  clobetasol cream (TEMOVATE) 0.05 %  02/11/13   Historical Provider, MD  cyclobenzaprine (FLEXERIL) 10 MG  tablet Take 10 mg by mouth 3 (three) times daily as needed.    Historical Provider, MD  diclofenac sodium (VOLTAREN) 1 % GEL Apply 2 g topically 4 (four) times daily. Rub into affected area of foot 2 to 4 times daily 01/19/13   Trudie Buckler, DPM  ferrous fumarate (HEMOCYTE - 106 MG FE) 325 (106 FE) MG TABS Take 1 tablet by mouth.    Historical Provider, MD  folic acid (FOLVITE) 1 MG tablet Take 1 mg by mouth daily.    Historical Provider, MD  HYDROcodone-acetaminophen (NORCO/VICODIN) 5-325 MG per tablet Take 1 tablet by mouth every 6 (six) hours as needed for moderate pain.    Historical Provider, MD  InFLIXimab (REMICADE IV) Inject into the vein. q  Every 4 - 6 weeks    Historical Provider, MD  ketoconazole (NIZORAL) 2 % cream  03/02/13   Historical Provider, MD  leflunomide (ARAVA) 20 MG tablet  01/23/13   Historical Provider, MD  levothyroxine (SYNTHROID, LEVOTHROID) 200 MCG tablet Take 200 mcg by mouth daily before breakfast.    Historical Provider, MD  losartan (COZAAR) 50 MG tablet Take 50 mg by mouth daily.    Historical Provider, MD  multivitamin [facility]) per tablet Take 1 tablet by mouth daily.    Historical Provider, MD  MYRBETRIQ 50 MG TB24 tablet  03/08/13   Historical Provider, MD  nystatin (MYCOSTATIN) powder  03/09/13   Historical Provider, MD  pregabalin (LYRICA) 75 MG capsule Take 75 mg by mouth 2 (two) times daily.    Historical Provider, MD  silver sulfADIAZINE (SILVADENE) 1 % cream Apply 1 application topically daily. 04/13/13   Trudie Buckler, DPM  traMADol Veatrice Bourbon) 50 MG tablet  01/14/13   Historical Provider, MD  triamcinolone cream (KENALOG) 0.1 %  02/03/13   Historical Provider, MD  zolpidem (AMBIEN) 10 MG tablet Take 10 mg by mouth at bedtime as needed.    Historical Provider, MD   BP 162/59  Pulse 107  Temp(Src) 97.7 F (36.5 C) (Oral)  Resp 18  SpO2 100% Physical Exam  Nursing note and vitals reviewed. Constitutional: She appears well-developed and well-nourished. No  distress.  Pt lying in exam bed, appears well, non-toxic. NAD  HENT:  Head: Normocephalic and atraumatic.  Right Ear: Hearing, tympanic membrane, external ear and ear canal normal.  Left Ear: Hearing, tympanic membrane, external ear and ear canal normal.  Nose: Nose normal.  Mouth/Throat: Uvula is midline and mucous membranes are normal. Posterior oropharyngeal edema ( mild edema and irritation in right tonsil ) and posterior oropharyngeal erythema present. No oropharyngeal exudate or tonsillar abscesses.  Eyes: Conjunctivae are normal. No scleral icterus.  Neck: Normal range  of motion. Neck supple. No JVD present. No tracheal deviation present. No thyromegaly present.  Cardiovascular: Normal rate, regular rhythm and normal heart sounds.   Pulmonary/Chest: Effort normal and breath sounds normal. No stridor. No respiratory distress. She has no wheezes. She has no rales. She exhibits no tenderness.  No respiratory distress, able to speak in full sentences w/o difficulty. Lungs: CTAB  Abdominal: Soft. Bowel sounds are normal. She exhibits no distension and no mass. There is no tenderness. There is no rebound and no guarding.  Musculoskeletal: Normal range of motion.  Lymphadenopathy:    She has no cervical adenopathy.  Neurological: She is alert.  Skin: Skin is warm and dry. She is not diaphoretic.    ED Course  Procedures (including critical care time) Labs Review Labs Reviewed  RAPID STREP SCREEN - Abnormal; Notable for the following:    Streptococcus, Group A Screen (Direct) POSITIVE (*)    All other components within normal limits    Imaging Review Dg Neck Soft Tissue  06/19/2013   CLINICAL DATA:  Sore throat.  EXAM: NECK SOFT TISSUES - 1+ VIEW  COMPARISON:  None.  FINDINGS: Epiglottis and aryepiglottic folds are normal. Airways patent. Retropharyngeal soft tissues normal. No radiopaque foreign body. Advanced degenerative disc and facet disease in the cervical spine. Bilateral  carotid calcifications noted.  IMPRESSION: No acute findings.   Electronically Signed   By: Rolm Baptise M.D.   On: 06/19/2013 12:56   Dg Chest 2 View  06/19/2013   CLINICAL DATA:  Sore throat, pain.  Cough.  EXAM: CHEST  2 VIEW  COMPARISON:  02/09/2013  FINDINGS: Linear densities in the lingula, likely scarring. Heart is upper limits normal in size. No acute airspace opacities or effusions. Flowing anterior osteophytes in the thoracic spine compatible with diffuse idiopathic skeletal hyperostosis.  IMPRESSION: No active cardiopulmonary disease.   Electronically Signed   By: Rolm Baptise M.D.   On: 06/19/2013 12:43     EKG Interpretation None      MDM   Final diagnoses:  Strep pharyngitis  Foreign body sensation in throat    Pt c/o foreign body sensation in her throat that started early this morning. Denies fever, n/v/d. Denies difficulty breathing or swallowing.  Vitals: WNL. No respiratory distress. Right sided tonsillar erythema with mild edema/irritation. Uvula midline. No tonsillar abscess. No foreign body visualized in oropharynx.  Rapid strep: positive.  Will tx with azithromycin as pt is allergic to PCN.  Pt able to keep down several ounces of PO fluids in ED. Advised to f/u with PCP in 2-3 days. Return precautions provided. Pt verbalized understanding and agreement with tx plan.  Discussed pt with Dr. Alvino Chapel who agrees with tx plan.    Noland Fordyce, PA-C 06/19/13 1531

## 2013-06-19 NOTE — ED Notes (Signed)
Pt reports sore throat, pain started this morning around 0300. Pt was not eating anything. Pain when swallowing. 5/10. Reports coughing up mucus that is blood tinged. Denies n/v/d.

## 2013-06-19 NOTE — ED Notes (Signed)
Pt drank diet Sprite and was ale to swallow without difficulty.

## 2013-06-21 NOTE — ED Provider Notes (Signed)
Medical screening examination/treatment/procedure(s) were performed by non-physician practitioner and as supervising physician I was immediately available for consultation/collaboration.   EKG Interpretation None       Jasper Riling. Alvino Chapel, MD 06/21/13 1450

## 2013-06-23 DIAGNOSIS — M25559 Pain in unspecified hip: Secondary | ICD-10-CM | POA: Diagnosis not present

## 2013-06-23 DIAGNOSIS — M25569 Pain in unspecified knee: Secondary | ICD-10-CM | POA: Diagnosis not present

## 2013-06-29 ENCOUNTER — Encounter: Payer: Self-pay | Admitting: Podiatrist

## 2013-06-29 ENCOUNTER — Ambulatory Visit (INDEPENDENT_AMBULATORY_CARE_PROVIDER_SITE_OTHER): Payer: Medicare Other | Admitting: Podiatrist

## 2013-06-29 VITALS — BP 169/83 | HR 70 | Resp 16

## 2013-06-29 DIAGNOSIS — L97521 Non-pressure chronic ulcer of other part of left foot limited to breakdown of skin: Secondary | ICD-10-CM

## 2013-06-29 DIAGNOSIS — M2042 Other hammer toe(s) (acquired), left foot: Secondary | ICD-10-CM

## 2013-06-29 DIAGNOSIS — L97509 Non-pressure chronic ulcer of other part of unspecified foot with unspecified severity: Secondary | ICD-10-CM

## 2013-06-29 DIAGNOSIS — M2040 Other hammer toe(s) (acquired), unspecified foot: Secondary | ICD-10-CM

## 2013-06-29 NOTE — Progress Notes (Signed)
Subjective: Patient presents today for followup of left second toe ulcer. She has been using the Silver cream on her toe and it is improved. Overall she states that the toe is improving again. Denies any systemic or local signs of infection.   Objective: Neurovascular status is unchanged from her last visit. Contracted hammertoe left second digit is present with a healed ulceration on the dorsal aspect. No sign of infection or abscess is present. periwound hyperkeratotic tissue is present.   Assessment: healed Ulcer left second toe dorsally  Plan: Offloading pad options applied to the left second toe ulcer. We discussed surgical correction of the left second toe considering ulceration continues to return. She has a convention in July and I recommended she wait till after this to get the toe fixed. Recommended she continue to wear her surgical sandal in the meantime. We'll call if she has any problems, concerns or signs of infection arise. She will start using cetaphil on the exzema

## 2013-06-29 NOTE — Patient Instructions (Signed)
cetaphil is the name of the cream for your foot-- your toe looks great so you can stop applying any topicals.

## 2013-07-05 DIAGNOSIS — E1149 Type 2 diabetes mellitus with other diabetic neurological complication: Secondary | ICD-10-CM | POA: Diagnosis not present

## 2013-07-05 DIAGNOSIS — Z6834 Body mass index (BMI) 34.0-34.9, adult: Secondary | ICD-10-CM | POA: Diagnosis not present

## 2013-07-05 DIAGNOSIS — E039 Hypothyroidism, unspecified: Secondary | ICD-10-CM | POA: Diagnosis not present

## 2013-07-05 DIAGNOSIS — E1142 Type 2 diabetes mellitus with diabetic polyneuropathy: Secondary | ICD-10-CM | POA: Diagnosis not present

## 2013-07-05 DIAGNOSIS — E669 Obesity, unspecified: Secondary | ICD-10-CM | POA: Diagnosis not present

## 2013-07-05 DIAGNOSIS — Z23 Encounter for immunization: Secondary | ICD-10-CM | POA: Diagnosis not present

## 2013-07-05 DIAGNOSIS — E785 Hyperlipidemia, unspecified: Secondary | ICD-10-CM | POA: Diagnosis not present

## 2013-07-13 ENCOUNTER — Encounter: Payer: Self-pay | Admitting: Podiatrist

## 2013-07-13 ENCOUNTER — Ambulatory Visit (INDEPENDENT_AMBULATORY_CARE_PROVIDER_SITE_OTHER): Payer: Medicare Other | Admitting: Podiatrist

## 2013-07-13 VITALS — BP 130/54 | HR 76 | Resp 18

## 2013-07-13 DIAGNOSIS — M79609 Pain in unspecified limb: Secondary | ICD-10-CM

## 2013-07-13 DIAGNOSIS — E1149 Type 2 diabetes mellitus with other diabetic neurological complication: Secondary | ICD-10-CM

## 2013-07-13 DIAGNOSIS — M2040 Other hammer toe(s) (acquired), unspecified foot: Secondary | ICD-10-CM

## 2013-07-13 DIAGNOSIS — L97509 Non-pressure chronic ulcer of other part of unspecified foot with unspecified severity: Secondary | ICD-10-CM

## 2013-07-13 DIAGNOSIS — B351 Tinea unguium: Secondary | ICD-10-CM

## 2013-07-13 DIAGNOSIS — E1140 Type 2 diabetes mellitus with diabetic neuropathy, unspecified: Secondary | ICD-10-CM

## 2013-07-13 DIAGNOSIS — L97521 Non-pressure chronic ulcer of other part of left foot limited to breakdown of skin: Secondary | ICD-10-CM

## 2013-07-13 DIAGNOSIS — E1142 Type 2 diabetes mellitus with diabetic polyneuropathy: Secondary | ICD-10-CM

## 2013-07-13 DIAGNOSIS — M79673 Pain in unspecified foot: Secondary | ICD-10-CM

## 2013-07-13 DIAGNOSIS — M2042 Other hammer toe(s) (acquired), left foot: Secondary | ICD-10-CM

## 2013-07-13 NOTE — Progress Notes (Signed)
I NEED MY TOENAILS TRIMMED UP And TRIM MY CALLUSES ON BOTTOM OF BOTH FEET AND THE 2ND TOE ON THE LEFT IS DOING BETTER    Subjective: Patient presents today for followup of foot and nail care and for followup of left second toe ulcer. She has been using the Silver cream on her toe and it is improved. Denies any systemic or local signs of infection.   Objective: Neurovascular status is unchanged from her last visit. Contracted hammertoe left second digit is present with a healed ulceration on the dorsal aspect. No sign of infection or abscess is present. periwound hyperkeratotic tissue is present. Her toenails are elongated, thickened, discolored, dystrophic and clinically mycotic bilateral  Assessment: Symptomatic mycotic toenails, healed Ulcer left second toe dorsally  Plan: Debridement of toenails was carried out today without complication. She will continue to offload the second toe and will call if any problems or breakdown in skin arises. Otherwise I will see her back in 8 weeks

## 2013-07-14 ENCOUNTER — Ambulatory Visit: Payer: Medicare Other | Admitting: Podiatrist

## 2013-07-14 DIAGNOSIS — N3942 Incontinence without sensory awareness: Secondary | ICD-10-CM | POA: Diagnosis not present

## 2013-07-21 DIAGNOSIS — Z09 Encounter for follow-up examination after completed treatment for conditions other than malignant neoplasm: Secondary | ICD-10-CM | POA: Diagnosis not present

## 2013-07-21 DIAGNOSIS — M069 Rheumatoid arthritis, unspecified: Secondary | ICD-10-CM | POA: Diagnosis not present

## 2013-07-21 DIAGNOSIS — M25549 Pain in joints of unspecified hand: Secondary | ICD-10-CM | POA: Diagnosis not present

## 2013-07-21 DIAGNOSIS — M25529 Pain in unspecified elbow: Secondary | ICD-10-CM | POA: Diagnosis not present

## 2013-07-25 DIAGNOSIS — H40029 Open angle with borderline findings, high risk, unspecified eye: Secondary | ICD-10-CM | POA: Diagnosis not present

## 2013-07-25 DIAGNOSIS — H2510 Age-related nuclear cataract, unspecified eye: Secondary | ICD-10-CM | POA: Diagnosis not present

## 2013-07-29 DIAGNOSIS — M5137 Other intervertebral disc degeneration, lumbosacral region: Secondary | ICD-10-CM | POA: Diagnosis not present

## 2013-07-29 DIAGNOSIS — IMO0002 Reserved for concepts with insufficient information to code with codable children: Secondary | ICD-10-CM | POA: Diagnosis not present

## 2013-07-29 DIAGNOSIS — M47817 Spondylosis without myelopathy or radiculopathy, lumbosacral region: Secondary | ICD-10-CM | POA: Diagnosis not present

## 2013-08-03 ENCOUNTER — Other Ambulatory Visit: Payer: Self-pay | Admitting: *Deleted

## 2013-08-03 ENCOUNTER — Other Ambulatory Visit: Payer: Self-pay | Admitting: Family Medicine

## 2013-08-03 ENCOUNTER — Ambulatory Visit
Admission: RE | Admit: 2013-08-03 | Discharge: 2013-08-03 | Disposition: A | Discharge status: Home / Self Care | Payer: Medicare Other | Source: Ambulatory Visit | Attending: Family Medicine | Admitting: Family Medicine

## 2013-08-03 DIAGNOSIS — R079 Chest pain, unspecified: Secondary | ICD-10-CM | POA: Diagnosis not present

## 2013-08-04 NOTE — Progress Notes (Signed)
Cardiology Office Note    Date:  08/05/2013   ID:  [name], DOB Apr 03, 1940, MRN [id number]  PCP:  Lilian Coma, MD  Cardiologist:  Dr. Candee Furbish      History of Present Illness: [name] is a 73 y.o. female with a hx of carotid stenosis, mild AI/AS, diastolic dysfunction, DM, HTN, HL, syncope.  Last seen by Dr. Candee Furbish in 12/2012.  Seen by PCP 7/29 for general visit.  She complained of chest pain.  She was set up for follow up today.    Patient notes exertional chest heaviness that has been getting worse over the past week.  She has symptoms at rest.  Chest pain is associated with dyspnea, diaphoresis, nausea and some radiation to her L jaw.  She denies syncope.  She has felt near syncopal.  Weight is up 8 lbs since June.  Sleeps on an incline chronically.  Has had PND.  LE edema increased recently.  Notes dyspnea with minimal activity at home (NYHA 3-3b).  She states she has been spending a lot of time in bed this week because she feels so bad.    Studies:  - Echo (3/14):  EF 60-65%, no RWMA, borderline LAE, mild MR, mild AS (mean 12 mmHg), trace AI, borderline Ao root dilation (3.7 cm), Gr 1 DD  - Nuclear (2/14):  Low risk, no ischemia, normal EF  - Event (2/14):  No significant arrhythmias, PVCs  - Carotid US bilateral 40% (?date?)   Recent Labs/Images: 03/28/2013: ALT 20; Creatinine 1.10; Hemoglobin 10.5*; Potassium 3.9   Dg Chest 2 View  08/03/2013    IMPRESSION: No active cardiopulmonary disease.   Electronically Signed   By: Skipper Cliche M.D.   On: 08/03/2013 16:37     Wt Readings from Last 3 Encounters:  08/05/13 197 lb (89.359 kg)  06/13/13 189 lb (85.73 kg)  06/06/13 189 lb (85.73 kg)     Past Medical History  Diagnosis Date  . Rheumatoid arthritis(714.0)   . Glaucoma   . HTN (hypertension)   . Hypothyroidism   . Diabetes mellitus   . Hypercholesteremia   . Chronic pain   . Allergic rhinitis   . Peripheral neuropathy   .  Osteoarthritis   . Diverticulosis   . Hemorrhoids     internal  . PPD positive     6 months ago  . Anemia   . Sickle cell trait   . Meningioma 2008  . CKD (chronic kidney disease), stage III   . Aortic stenosis 12/22/2012    Mild 3/14-2.35 m/s peak velocity  . Hyperlipidemia 12/22/2012  . PVC (premature ventricular contraction) 12/22/2012    Current Outpatient Prescriptions  Medication Sig Dispense Refill  . allopurinol (ZYLOPRIM) 300 MG tablet Take 300 mg by mouth daily.      Marland Kitchen atorvastatin (LIPITOR) 40 MG tablet Take 40 mg by mouth daily.      . cholecalciferol (VITAMIN D) 1000 UNITS tablet Take 400 Units by mouth daily. 2 capsule      . clobetasol cream (TEMOVATE) 0.05 %       . cyclobenzaprine (FLEXERIL) 10 MG tablet Take 10 mg by mouth 3 (three) times daily as needed.      . diclofenac sodium (VOLTAREN) 1 % GEL Apply 2 g topically 4 (four) times daily. Rub into affected area of foot 2 to 4 times daily  100 g  2  . ferrous fumarate (HEMOCYTE - 106 MG FE) 325 (106  FE) MG TABS Take 1 tablet by mouth.      . folic acid (FOLVITE) 1 MG tablet Take 1 mg by mouth daily.      Marland Kitchen ketoconazole (NIZORAL) 2 % cream       . levothyroxine (SYNTHROID, LEVOTHROID) 200 MCG tablet Take 200 mcg by mouth daily before breakfast.      . losartan (COZAAR) 50 MG tablet Take 50 mg by mouth daily.      . multivitamin (THERAGRAN) per tablet Take 1 tablet by mouth daily.      Marland Kitchen MYRBETRIQ 50 MG TB24 tablet       . nystatin (MYCOSTATIN) powder       . pregabalin (LYRICA) 75 MG capsule Take 75 mg by mouth 2 (two) times daily.      . silver sulfADIAZINE (SILVADENE) 1 % cream Apply 1 application topically daily.  50 g  2  . traMADol (ULTRAM) 50 MG tablet       . triamcinolone cream (KENALOG) 0.1 %       . zolpidem (AMBIEN) 10 MG tablet Take 10 mg by mouth at bedtime as needed.       No current facility-administered medications for this visit.     Allergies:   Penicillins; Other; and Zocor   Social  History:  The patient  reports that she quit smoking about 51 years ago. She does not have any smokeless tobacco history on file. She reports that she does not use illicit drugs.   Family History:  The patient's family history includes CVA in her mother; Diabetes in her mother; Hypertension in her father; Hypotension in her mother; Kidney failure in her father.   ROS:  Please see the history of present illness.   Stools are dark.  She takes Iron.   All other systems reviewed and negative.   PHYSICAL EXAM: VS:  BP 130/60  Pulse 70  Ht 5\' 4"  (1.626 m)  Wt 197 lb (89.359 kg)  BMI 33.80 kg/m2 Well nourished, well developed, in no acute distress HEENT: normal Neck: no JVD Cardiac:  normal S1, S2; RRR; [id number] systolic murmur at RUSB Lungs:  clear to auscultation bilaterally, no wheezing, rhonchi or rales Abd: soft, nontender, no hepatomegaly Ext: trace bilateral LE edema Skin: warm and dry Neuro:  CNs 2-12 intact, no focal abnormalities noted  EKG:  NSR, HR 70, normal axis, NSSTTW changes   ASSESSMENT AND PLAN:  Unstable angina:  Symptoms are concerning for unstable angina.  She has a hx of "diastolic dysfunction".  Weight is up.  However, she has no signs of volume overload on exam.  Discussed with Dr. Virl Axe (DOD).  We recommend admission to the hospital today for cardiac cath.  Will see if can be done today.  Check serial CEs.  Will get BNP as well.  Give dose of Lasix 40 mg IV x 1.  Patient placed on O2 in the office and give ASA 81 mg x 4.  Risks and benefits of cardiac catheterization have been discussed with the patient.  These include bleeding, infection, kidney damage, stroke, heart attack, death.  The patient understands these risks and is willing to proceed.  Start Toprol-XL 25 QD, IV Heparin.  She was interviewed and examined by Dr. Virl Axe as well.  He had a long discussion with the patient and her husband.  They are hesitant to go to the hospital and are not convinced  that she needs admission, cardiac cath, etc.  The patient and  her husband did discuss this and ultimately decided to go to the hospital.  We recommended transport by EMS.  However, the patient and her husband refused.  She did sign a document to express that she understands the potential risk of not traveling by ambulance.  She is not certain she would agree to cardiac cath today.  We will put her on the cath board for Monday, 8/3.  She will be observed over the weekend.  Essential hypertension:  Fair control.  Add antianginals as above.  Aortic stenosis:  AS does not sound worse on exam.  Will obtain repeat echo in the hospital.    Hyperlipidemia:  Continue statin.   Disposition:  Admit to [facility] today.  Plan cath today or Monday.    Signed, Versie Starks, MHS 08/05/2013 8:42 AM    [facility] [place], [place], [place]  [address] Phone: [phone]; Fax: [phone]

## 2013-08-05 ENCOUNTER — Encounter: Payer: Self-pay | Admitting: *Deleted

## 2013-08-05 ENCOUNTER — Encounter: Payer: Self-pay | Admitting: Physician Assistant

## 2013-08-05 ENCOUNTER — Ambulatory Visit (INDEPENDENT_AMBULATORY_CARE_PROVIDER_SITE_OTHER): Payer: Medicare Other | Admitting: Physician Assistant

## 2013-08-05 ENCOUNTER — Observation Stay (HOSPITAL_COMMUNITY)
Admission: AD | Admit: 2013-08-05 | Discharge: 2013-08-08 | Disposition: A | Discharge status: Home / Self Care | Payer: Medicare Other | Source: Ambulatory Visit | Attending: Internal Medicine | Admitting: Internal Medicine

## 2013-08-05 VITALS — BP 150/60 | HR 69 | Ht 64.0 in | Wt 197.0 lb

## 2013-08-05 DIAGNOSIS — I4949 Other premature depolarization: Secondary | ICD-10-CM | POA: Diagnosis not present

## 2013-08-05 DIAGNOSIS — N183 Chronic kidney disease, stage 3 unspecified: Secondary | ICD-10-CM | POA: Diagnosis not present

## 2013-08-05 DIAGNOSIS — I200 Unstable angina: Secondary | ICD-10-CM

## 2013-08-05 DIAGNOSIS — R0609 Other forms of dyspnea: Secondary | ICD-10-CM | POA: Insufficient documentation

## 2013-08-05 DIAGNOSIS — H409 Unspecified glaucoma: Secondary | ICD-10-CM | POA: Insufficient documentation

## 2013-08-05 DIAGNOSIS — Z87891 Personal history of nicotine dependence: Secondary | ICD-10-CM | POA: Insufficient documentation

## 2013-08-05 DIAGNOSIS — M069 Rheumatoid arthritis, unspecified: Secondary | ICD-10-CM | POA: Diagnosis not present

## 2013-08-05 DIAGNOSIS — R61 Generalized hyperhidrosis: Secondary | ICD-10-CM | POA: Diagnosis not present

## 2013-08-05 DIAGNOSIS — I359 Nonrheumatic aortic valve disorder, unspecified: Secondary | ICD-10-CM | POA: Diagnosis not present

## 2013-08-05 DIAGNOSIS — E785 Hyperlipidemia, unspecified: Secondary | ICD-10-CM | POA: Diagnosis not present

## 2013-08-05 DIAGNOSIS — D573 Sickle-cell trait: Secondary | ICD-10-CM | POA: Diagnosis not present

## 2013-08-05 DIAGNOSIS — R079 Chest pain, unspecified: Principal | ICD-10-CM | POA: Diagnosis present

## 2013-08-05 DIAGNOSIS — E119 Type 2 diabetes mellitus without complications: Secondary | ICD-10-CM | POA: Diagnosis not present

## 2013-08-05 DIAGNOSIS — M1990 Unspecified osteoarthritis, unspecified site: Secondary | ICD-10-CM | POA: Insufficient documentation

## 2013-08-05 DIAGNOSIS — G8929 Other chronic pain: Secondary | ICD-10-CM | POA: Diagnosis not present

## 2013-08-05 DIAGNOSIS — I10 Essential (primary) hypertension: Secondary | ICD-10-CM | POA: Diagnosis present

## 2013-08-05 DIAGNOSIS — G609 Hereditary and idiopathic neuropathy, unspecified: Secondary | ICD-10-CM | POA: Insufficient documentation

## 2013-08-05 DIAGNOSIS — I350 Nonrheumatic aortic (valve) stenosis: Secondary | ICD-10-CM

## 2013-08-05 DIAGNOSIS — K5730 Diverticulosis of large intestine without perforation or abscess without bleeding: Secondary | ICD-10-CM | POA: Diagnosis not present

## 2013-08-05 DIAGNOSIS — C260 Malignant neoplasm of intestinal tract, part unspecified: Secondary | ICD-10-CM | POA: Diagnosis not present

## 2013-08-05 DIAGNOSIS — E669 Obesity, unspecified: Secondary | ICD-10-CM | POA: Diagnosis not present

## 2013-08-05 DIAGNOSIS — I493 Ventricular premature depolarization: Secondary | ICD-10-CM | POA: Diagnosis present

## 2013-08-05 DIAGNOSIS — I5032 Chronic diastolic (congestive) heart failure: Secondary | ICD-10-CM

## 2013-08-05 DIAGNOSIS — Z6833 Body mass index (BMI) 33.0-33.9, adult: Secondary | ICD-10-CM | POA: Insufficient documentation

## 2013-08-05 DIAGNOSIS — Z79899 Other long term (current) drug therapy: Secondary | ICD-10-CM | POA: Diagnosis not present

## 2013-08-05 DIAGNOSIS — R0989 Other specified symptoms and signs involving the circulatory and respiratory systems: Secondary | ICD-10-CM | POA: Diagnosis not present

## 2013-08-05 DIAGNOSIS — I509 Heart failure, unspecified: Secondary | ICD-10-CM | POA: Diagnosis not present

## 2013-08-05 DIAGNOSIS — I129 Hypertensive chronic kidney disease with stage 1 through stage 4 chronic kidney disease, or unspecified chronic kidney disease: Secondary | ICD-10-CM | POA: Insufficient documentation

## 2013-08-05 DIAGNOSIS — R110 Nausea: Secondary | ICD-10-CM | POA: Insufficient documentation

## 2013-08-05 DIAGNOSIS — E039 Hypothyroidism, unspecified: Secondary | ICD-10-CM | POA: Insufficient documentation

## 2013-08-05 LAB — BASIC METABOLIC PANEL
Anion gap: 17 — ABNORMAL HIGH (ref 5–15)
BUN: 29 mg/dL — ABNORMAL HIGH (ref 6–23)
CO2: 18 meq/L — AB (ref 19–32)
Calcium: 9.4 mg/dL (ref 8.4–10.5)
Chloride: 103 mEq/L (ref 96–112)
Creatinine, Ser: 1.2 mg/dL — ABNORMAL HIGH (ref 0.50–1.10)
GFR calc Af Amer: 50 mL/min — ABNORMAL LOW (ref >90)
GFR calc non Af Amer: 43 mL/min — ABNORMAL LOW (ref >90)
GLUCOSE: 177 mg/dL — AB (ref 70–99)
POTASSIUM: 5.1 meq/L (ref 3.7–5.3)
SODIUM: 138 meq/L (ref 137–147)

## 2013-08-05 LAB — PRO B NATRIURETIC PEPTIDE: Pro B Natriuretic peptide (BNP): 77.5 pg/mL (ref 0–125)

## 2013-08-05 LAB — COMPREHENSIVE METABOLIC PANEL
ALK PHOS: 79 U/L (ref 39–117)
ALT: 53 U/L — AB (ref 0–35)
AST: 50 U/L — ABNORMAL HIGH (ref 0–37)
Albumin: 3.6 g/dL (ref 3.5–5.2)
Anion gap: 12 (ref 5–15)
BUN: 29 mg/dL — ABNORMAL HIGH (ref 6–23)
CALCIUM: 9.2 mg/dL (ref 8.4–10.5)
CO2: 21 mEq/L (ref 19–32)
Chloride: 103 mEq/L (ref 96–112)
Creatinine, Ser: 1.09 mg/dL (ref 0.50–1.10)
GFR calc Af Amer: 57 mL/min — ABNORMAL LOW (ref >90)
GFR calc non Af Amer: 49 mL/min — ABNORMAL LOW (ref >90)
Glucose, Bld: 105 mg/dL — ABNORMAL HIGH (ref 70–99)
Potassium: 7.1 mEq/L (ref 3.7–5.3)
Sodium: 136 mEq/L — ABNORMAL LOW (ref 137–147)
TOTAL PROTEIN: 7.8 g/dL (ref 6.0–8.3)
Total Bilirubin: 0.4 mg/dL (ref 0.3–1.2)

## 2013-08-05 LAB — PROTIME-INR
INR: 0.92 (ref 0.00–1.49)
PROTHROMBIN TIME: 12.4 s (ref 11.6–15.2)

## 2013-08-05 LAB — GLUCOSE, CAPILLARY
GLUCOSE-CAPILLARY: 209 mg/dL — AB (ref 70–99)
GLUCOSE-CAPILLARY: 339 mg/dL — AB (ref 70–99)
Glucose-Capillary: 125 mg/dL — ABNORMAL HIGH (ref 70–99)

## 2013-08-05 LAB — TROPONIN I
Troponin I: <0.3 ng/mL (ref <0.30)
Troponin I: <0.3 ng/mL (ref <0.30)

## 2013-08-05 LAB — APTT: APTT: 22 s — AB (ref 24–37)

## 2013-08-05 MED ORDER — KETOCONAZOLE 2 % EX CREA
TOPICAL_CREAM | Freq: Two times a day (BID) | CUTANEOUS | Status: DC | PRN
  Filled 2013-08-05: qty 15

## 2013-08-05 MED ORDER — SILVER SULFADIAZINE 1 % EX CREA
1.0000 "application " | TOPICAL_CREAM | Freq: Every day | CUTANEOUS | Status: DC
  Administered 2013-08-06 – 2013-08-08 (×3): 1 via TOPICAL
  Filled 2013-08-05: qty 85

## 2013-08-05 MED ORDER — PREGABALIN 75 MG PO CAPS
150.0000 mg | ORAL_CAPSULE | Freq: Every day | ORAL | Status: DC
  Administered 2013-08-05 – 2013-08-07 (×3): 150 mg via ORAL
  Filled 2013-08-05 (×3): qty 2

## 2013-08-05 MED ORDER — HEPARIN (PORCINE) IN NACL 100-0.45 UNIT/ML-% IJ SOLN
900.0000 [IU]/h | INTRAMUSCULAR | Status: DC
  Filled 2013-08-05: qty 250

## 2013-08-05 MED ORDER — SODIUM CHLORIDE 0.9 % IV SOLN: 250.0000 mL | INTRAVENOUS | Status: DC | PRN

## 2013-08-05 MED ORDER — SODIUM CHLORIDE 0.9 % IJ SOLN
3.0000 mL | Freq: Two times a day (BID) | INTRAMUSCULAR | Status: DC
  Administered 2013-08-06 – 2013-08-07 (×3): 3 mL via INTRAVENOUS

## 2013-08-05 MED ORDER — METOPROLOL SUCCINATE ER 25 MG PO TB24
25.0000 mg | ORAL_TABLET | Freq: Every day | ORAL | Status: DC
  Administered 2013-08-05 – 2013-08-07 (×3): 25 mg via ORAL
  Filled 2013-08-05 (×4): qty 1

## 2013-08-05 MED ORDER — ATORVASTATIN CALCIUM 40 MG PO TABS
40.0000 mg | ORAL_TABLET | Freq: Every day | ORAL | Status: DC
  Administered 2013-08-05 – 2013-08-08 (×4): 40 mg via ORAL
  Filled 2013-08-05 (×4): qty 1

## 2013-08-05 MED ORDER — FUROSEMIDE 10 MG/ML IJ SOLN: 40.0000 mg | Freq: Once | INTRAMUSCULAR | Status: DC

## 2013-08-05 MED ORDER — ASPIRIN EC 81 MG PO TBEC: 81.0000 mg | DELAYED_RELEASE_TABLET | ORAL | Status: DC

## 2013-08-05 MED ORDER — CLOBETASOL PROPIONATE 0.05 % EX CREA
TOPICAL_CREAM | Freq: Two times a day (BID) | CUTANEOUS | Status: DC
  Administered 2013-08-05 – 2013-08-06 (×2): via TOPICAL
  Administered 2013-08-06: 1 via TOPICAL
  Administered 2013-08-07: 10:00:00 via TOPICAL
  Administered 2013-08-07: 1 via TOPICAL
  Administered 2013-08-08: 11:00:00 via TOPICAL
  Filled 2013-08-05: qty 15

## 2013-08-05 MED ORDER — FUROSEMIDE 80 MG PO TABS
80.0000 mg | ORAL_TABLET | Freq: Once | ORAL | Status: AC
  Administered 2013-08-05: 80 mg via ORAL
  Filled 2013-08-05: qty 1

## 2013-08-05 MED ORDER — HEPARIN BOLUS VIA INFUSION
4000.0000 [IU] | Freq: Once | INTRAVENOUS | Status: DC
  Filled 2013-08-05: qty 4000

## 2013-08-05 MED ORDER — INSULIN ASPART 100 UNIT/ML ~~LOC~~ SOLN
0.0000 [IU] | Freq: Three times a day (TID) | SUBCUTANEOUS | Status: DC
  Administered 2013-08-05: 7 [IU] via SUBCUTANEOUS
  Administered 2013-08-06: 2 [IU] via SUBCUTANEOUS
  Administered 2013-08-06 (×2): 1 [IU] via SUBCUTANEOUS
  Administered 2013-08-07: 5 [IU] via SUBCUTANEOUS
  Administered 2013-08-07: 3 [IU] via SUBCUTANEOUS
  Administered 2013-08-07 – 2013-08-08 (×2): 2 [IU] via SUBCUTANEOUS

## 2013-08-05 MED ORDER — LOSARTAN POTASSIUM 50 MG PO TABS
50.0000 mg | ORAL_TABLET | Freq: Every day | ORAL | Status: DC
  Administered 2013-08-05 – 2013-08-08 (×4): 50 mg via ORAL
  Filled 2013-08-05 (×4): qty 1

## 2013-08-05 MED ORDER — PREGABALIN 75 MG PO CAPS
75.0000 mg | ORAL_CAPSULE | Freq: Every morning | ORAL | Status: DC
  Administered 2013-08-06 – 2013-08-08 (×3): 75 mg via ORAL
  Filled 2013-08-05 (×3): qty 1

## 2013-08-05 MED ORDER — ASPIRIN EC 81 MG PO TBEC
81.0000 mg | DELAYED_RELEASE_TABLET | Freq: Every day | ORAL | Status: DC
Start: 2013-08-06 — End: 2013-08-08
  Administered 2013-08-06 – 2013-08-07 (×2): 81 mg via ORAL
  Filled 2013-08-05 (×3): qty 1

## 2013-08-05 MED ORDER — ENOXAPARIN SODIUM 100 MG/ML ~~LOC~~ SOLN
1.0000 mg/kg | Freq: Two times a day (BID) | SUBCUTANEOUS | Status: DC
  Administered 2013-08-05 – 2013-08-07 (×4): 90 mg via SUBCUTANEOUS
  Filled 2013-08-05 (×6): qty 1

## 2013-08-05 MED ORDER — ALLOPURINOL 300 MG PO TABS
300.0000 mg | ORAL_TABLET | Freq: Every day | ORAL | Status: DC
  Administered 2013-08-05 – 2013-08-08 (×4): 300 mg via ORAL
  Filled 2013-08-05 (×4): qty 1

## 2013-08-05 MED ORDER — FOLIC ACID 1 MG PO TABS
1.0000 mg | ORAL_TABLET | Freq: Every day | ORAL | Status: DC
  Administered 2013-08-05 – 2013-08-08 (×4): 1 mg via ORAL
  Filled 2013-08-05 (×4): qty 1

## 2013-08-05 MED ORDER — NITROGLYCERIN 0.4 MG SL SUBL: 0.4000 mg | SUBLINGUAL_TABLET | SUBLINGUAL | Status: DC | PRN

## 2013-08-05 MED ORDER — ACETAMINOPHEN 325 MG PO TABS: 650.0000 mg | ORAL_TABLET | ORAL | Status: DC | PRN

## 2013-08-05 MED ORDER — ASPIRIN EC 81 MG PO TBEC
324.0000 mg | DELAYED_RELEASE_TABLET | ORAL | Status: AC
Start: 2013-08-05 — End: 2013-08-05
  Administered 2013-08-05: 324 mg via ORAL

## 2013-08-05 MED ORDER — PREGABALIN 75 MG PO CAPS
75.0000 mg | ORAL_CAPSULE | Freq: Two times a day (BID) | ORAL | Status: DC
  Administered 2013-08-05: 75 mg via ORAL
  Filled 2013-08-05: qty 1

## 2013-08-05 MED ORDER — CYCLOBENZAPRINE HCL 10 MG PO TABS
10.0000 mg | ORAL_TABLET | Freq: Three times a day (TID) | ORAL | Status: DC | PRN
  Administered 2013-08-05: 10 mg via ORAL
  Filled 2013-08-05: qty 1

## 2013-08-05 MED ORDER — LEVOTHYROXINE SODIUM 200 MCG PO TABS
200.0000 ug | ORAL_TABLET | Freq: Every day | ORAL | Status: DC
  Administered 2013-08-06 – 2013-08-08 (×3): 200 ug via ORAL
  Filled 2013-08-05 (×4): qty 1

## 2013-08-05 MED ORDER — SODIUM CHLORIDE 0.9 % IJ SOLN: 3.0000 mL | INTRAMUSCULAR | Status: DC | PRN

## 2013-08-05 MED ORDER — ZOLPIDEM TARTRATE 5 MG PO TABS
5.0000 mg | ORAL_TABLET | Freq: Every evening | ORAL | Status: DC | PRN
  Administered 2013-08-07: 5 mg via ORAL
  Filled 2013-08-05: qty 1

## 2013-08-05 MED ORDER — ONDANSETRON HCL 4 MG/2ML IJ SOLN: 4.0000 mg | Freq: Four times a day (QID) | INTRAMUSCULAR | Status: DC | PRN

## 2013-08-05 MED ORDER — INSULIN ASPART 100 UNIT/ML ~~LOC~~ SOLN: 0.0000 [IU] | Freq: Three times a day (TID) | SUBCUTANEOUS | Status: DC

## 2013-08-05 MED ORDER — ACETAMINOPHEN 500 MG PO TABS
1000.0000 mg | ORAL_TABLET | Freq: Four times a day (QID) | ORAL | Status: DC | PRN
  Administered 2013-08-05 – 2013-08-07 (×5): 1000 mg via ORAL
  Filled 2013-08-05 (×6): qty 2

## 2013-08-05 NOTE — Progress Notes (Signed)
Notified Cecilie Kicks NP of BMP results. OK to proceed with PICC line (poor venous access), new orders given. Carroll Kinds RN

## 2013-08-05 NOTE — H&P (Signed)
History and Physical    Date:  08/05/2013   ID:  [name], DOB Jun 15, 1940, MRN [id number]  PCP:  Lilian Coma, MD  Cardiologist:  Dr. Candee Furbish      History of Present Illness: [name] is a 73 y.o. female with a hx of carotid stenosis, mild AI/AS, diastolic dysfunction, DM, HTN, HL, syncope.  Last seen by Dr. Candee Furbish in 12/2012.  Seen by PCP 7/29 for general visit.  She complained of chest pain.  She was set up for follow up today.    Patient notes exertional chest heaviness that has been getting worse over the past week.  She has symptoms at rest.  Chest pain is associated with dyspnea, diaphoresis, nausea and some radiation to her L jaw.  She denies syncope.  She has felt near syncopal.  Weight is up 8 lbs since June.  Sleeps on an incline chronically.  Has had PND.  LE edema increased recently.  Notes dyspnea with minimal activity at home (NYHA 3-3b).  She states she has been spending a lot of time in bed this week because she feels so bad.    Studies:  - Echo (3/14):  EF 60-65%, no RWMA, borderline LAE, mild MR, mild AS (mean 12 mmHg), trace AI, borderline Ao root dilation (3.7 cm), Gr 1 DD  - Nuclear (2/14):  Low risk, no ischemia, normal EF  - Event (2/14):  No significant arrhythmias, PVCs  - Carotid US bilateral 40% (?date?)   Recent Labs/Images: 03/28/2013: ALT 20; Creatinine 1.10; Hemoglobin 10.5*; Potassium 3.9   Dg Chest 2 View  08/03/2013    IMPRESSION: No active cardiopulmonary disease.   Electronically Signed   By: Skipper Cliche M.D.   On: 08/03/2013 16:37     Wt Readings from Last 3 Encounters:  08/05/13 197 lb (89.359 kg)  06/13/13 189 lb (85.73 kg)  06/06/13 189 lb (85.73 kg)     Past Medical History  Diagnosis Date  . Rheumatoid arthritis(714.0)   . Glaucoma   . HTN (hypertension)   . Hypothyroidism   . Diabetes mellitus   . Hypercholesteremia   . Chronic pain   . Allergic rhinitis   . Peripheral neuropathy   .  Osteoarthritis   . Diverticulosis   . Hemorrhoids     internal  . PPD positive     6 months ago  . Anemia   . Sickle cell trait   . Meningioma 2008  . CKD (chronic kidney disease), stage III   . Aortic stenosis 12/22/2012    Mild 3/14-2.35 m/s peak velocity  . Hyperlipidemia 12/22/2012  . PVC (premature ventricular contraction) 12/22/2012    Current Outpatient Prescriptions  Medication Sig Dispense Refill  . allopurinol (ZYLOPRIM) 300 MG tablet Take 300 mg by mouth daily.      Marland Kitchen atorvastatin (LIPITOR) 40 MG tablet Take 40 mg by mouth daily.      . cholecalciferol (VITAMIN D) 1000 UNITS tablet Take 400 Units by mouth daily. 2 capsule      . clobetasol cream (TEMOVATE) 0.05 %       . cyclobenzaprine (FLEXERIL) 10 MG tablet Take 10 mg by mouth 3 (three) times daily as needed.      . diclofenac sodium (VOLTAREN) 1 % GEL Apply 2 g topically 4 (four) times daily. Rub into affected area of foot 2 to 4 times daily  100 g  2  . ferrous fumarate (HEMOCYTE - 106 MG FE) 325 (106  FE) MG TABS Take 1 tablet by mouth.      . folic acid (FOLVITE) 1 MG tablet Take 1 mg by mouth daily.      Marland Kitchen ketoconazole (NIZORAL) 2 % cream       . levothyroxine (SYNTHROID, LEVOTHROID) 200 MCG tablet Take 200 mcg by mouth daily before breakfast.      . losartan (COZAAR) 50 MG tablet Take 50 mg by mouth daily.      . multivitamin (THERAGRAN) per tablet Take 1 tablet by mouth daily.      Marland Kitchen MYRBETRIQ 50 MG TB24 tablet       . nystatin (MYCOSTATIN) powder       . pregabalin (LYRICA) 75 MG capsule Take 75 mg by mouth 2 (two) times daily.      . silver sulfADIAZINE (SILVADENE) 1 % cream Apply 1 application topically daily.  50 g  2  . traMADol (ULTRAM) 50 MG tablet       . triamcinolone cream (KENALOG) 0.1 %       . zolpidem (AMBIEN) 10 MG tablet Take 10 mg by mouth at bedtime as needed.       No current facility-administered medications for this visit.     Allergies:   Penicillins; Other; and Zocor   Social  History:  The patient  reports that she quit smoking about 51 years ago. She does not have any smokeless tobacco history on file. She reports that she does not use illicit drugs.   Family History:  The patient's family history includes CVA in her mother; Diabetes in her mother; Hypertension in her father; Hypotension in her mother; Kidney failure in her father.   ROS:  Please see the history of present illness.   Stools are dark.  She takes Iron.   All other systems reviewed and negative.   PHYSICAL EXAM: VS:  BP 130/60  Pulse 70  Ht 5\' 4"  (1.626 m)  Wt 197 lb (89.359 kg)  BMI 33.80 kg/m2 Well nourished, well developed, in no acute distress HEENT: normal Neck: no JVD Cardiac:  normal S1, S2; RRR; A999333 systolic murmur at RUSB Lungs:  clear to auscultation bilaterally, no wheezing, rhonchi or rales Abd: soft, nontender, no hepatomegaly Ext: trace bilateral LE edema Skin: warm and dry Neuro:  CNs 2-12 intact, no focal abnormalities noted  EKG:  NSR, HR 70, normal axis, NSSTTW changes   ASSESSMENT AND PLAN:  Unstable angina:  Symptoms are concerning for unstable angina.  She has a hx of "diastolic dysfunction".  Weight is up.  However, she has no signs of volume overload on exam.  Discussed with Dr. Virl Axe (DOD).  We recommend admission to the hospital today for cardiac cath.  Will see if can be done today.  Check serial CEs.  Will get BNP as well.  Give dose of Lasix 40 mg IV x 1.  Patient placed on O2 in the office and give ASA 81 mg x 4.  Risks and benefits of cardiac catheterization have been discussed with the patient.  These include bleeding, infection, kidney damage, stroke, heart attack, death.  The patient understands these risks and is willing to proceed.  Start Toprol-XL 25 QD, IV Heparin.  She was interviewed and examined by Dr. Virl Axe as well.  He had a long discussion with the patient and her husband.  They are hesitant to go to the hospital and are not convinced  that she needs admission, cardiac cath, etc.  The patient and  her husband did discuss this and ultimately decided to go to the hospital.  We recommended transport by EMS.  However, the patient and her husband refused.  She did sign a document to express that she understands the potential risk of not traveling by ambulance.  She is not certain she would agree to cardiac cath today.  We will put her on the cath board for Monday, 8/3.  She will be observed over the weekend.  Essential hypertension:  Fair control.  Add antianginals as above.  Aortic stenosis:  AS does not sound worse on exam.  Will obtain repeat echo in the hospital.    Hyperlipidemia:  Continue statin.   Disposition:  Admit to [facility] today.  Plan cath Monday.    Signed, [name], MHS 08/05/2013 8:42 AM    [facility], [place], [place] Phone: [phone]; Fax: [phone]    Very worrisome story for unstable angina with acute symptoms of chest pain (+evine sign) diaphoresis anddyspnea>> cath

## 2013-08-05 NOTE — Patient Instructions (Signed)
YOU HAVE BEEN ADMITTED TO [facility] YOU WILL BE GOING TO UNIT 3 WEST

## 2013-08-05 NOTE — Progress Notes (Signed)
Patient not a candidate for bedside PICC placement because veins are too small bilaterally.  Catheter occupancy is >50% on all veins visualized with ultrasound.  Deyonna Fitzsimmons, Nicolette Bang, RN VAST

## 2013-08-05 NOTE — Progress Notes (Signed)
  Echocardiogram 2D Echocardiogram has been performed.  [name] 08/05/2013, 3:35 PM

## 2013-08-05 NOTE — Progress Notes (Addendum)
ANTICOAGULATION CONSULT NOTE - Initial Consult  Pharmacy Consult for Heparin Indication: chest pain/ACS  Allergies  Allergen Reactions  . Penicillins Rash  . Other     Blood products  . Zocor [Simvastatin] Other (See Comments)    Leg cramps    Patient Measurements: Height: 5\' 4"  (162.6 cm) (from office visit 08/05/13) Weight: 197 lb (89.359 kg) (from office visit 08/05/13) IBW/kg (Calculated) : 54.7 Heparin Dosing Weight: 74.7 kg   Vital Signs: Temp: 97.6 F (36.4 C) (07/31 1046) Temp src: Oral (07/31 1046) BP: 125/73 mmHg (07/31 1046) Pulse Rate: 69 (07/31 1046)  Labs: No results found for this basename: HGB, HCT, PLT, APTT, LABPROT, INR, HEPARINUNFRC, CREATININE, CKTOTAL, CKMB, TROPONINI,  in the last 72 hours  Estimated Creatinine Clearance: 48.6 ml/min (by C-G formula based on Cr of 1.1).   Medical History: Past Medical History  Diagnosis Date  . Rheumatoid arthritis(714.0)   . Glaucoma   . HTN (hypertension)   . Hypothyroidism   . Diabetes mellitus   . Hypercholesteremia   . Chronic pain   . Allergic rhinitis   . Peripheral neuropathy   . Osteoarthritis   . Diverticulosis   . Hemorrhoids     internal  . PPD positive     6 months ago  . Anemia   . Sickle cell trait   . Meningioma 2008  . CKD (chronic kidney disease), stage III   . Aortic stenosis 12/22/2012    Mild 3/14-2.35 m/s peak velocity  . Hyperlipidemia 12/22/2012  . PVC (premature ventricular contraction) 12/22/2012    Assessment: 73 y.o female with hx of carotid stenosis, mild AI/AS, diastolic dysfunction, DM, HTN, HL, syncope admitted today for unstable angina from MD office with recommedation for cardiac cath.  To start on IV heparin infusion.  Baseline labs pending. No bleeding reported.  Not on anticoagulation prior to admission.   Goal of Therapy:  Heparin level 0.3-0.7 units/ml Monitor platelets by anticoagulation protocol: Yes   Plan:  Heparin 4000 units IV bolus x1.  Start IV  heparin drip 900 units/hr.  Heparin level in 8 hours Daily heparin level and CBC  Nicole Cella, RPh Clinical Pharmacist Pager: [phone] 08/05/2013,12:14 PM  Heparin drip changed to enoxaparin 1mg /kg = 90mg  q12  Bonnita Nasuti Pharm.D. CPP, BCPS Clinical Pharmacist [phone] 08/05/2013 5:36 PM

## 2013-08-06 DIAGNOSIS — I4949 Other premature depolarization: Secondary | ICD-10-CM | POA: Diagnosis not present

## 2013-08-06 DIAGNOSIS — I509 Heart failure, unspecified: Secondary | ICD-10-CM | POA: Diagnosis not present

## 2013-08-06 DIAGNOSIS — I200 Unstable angina: Secondary | ICD-10-CM | POA: Diagnosis not present

## 2013-08-06 DIAGNOSIS — R079 Chest pain, unspecified: Secondary | ICD-10-CM | POA: Diagnosis not present

## 2013-08-06 DIAGNOSIS — I5032 Chronic diastolic (congestive) heart failure: Secondary | ICD-10-CM | POA: Diagnosis not present

## 2013-08-06 DIAGNOSIS — I359 Nonrheumatic aortic valve disorder, unspecified: Secondary | ICD-10-CM | POA: Diagnosis not present

## 2013-08-06 DIAGNOSIS — E785 Hyperlipidemia, unspecified: Secondary | ICD-10-CM | POA: Diagnosis not present

## 2013-08-06 LAB — CBC
HCT: 31.8 % — ABNORMAL LOW (ref 36.0–46.0)
HEMOGLOBIN: 10.6 g/dL — AB (ref 12.0–15.0)
MCH: 29 pg (ref 26.0–34.0)
MCHC: 33.3 g/dL (ref 30.0–36.0)
MCV: 87.1 fL (ref 78.0–100.0)
Platelets: 273 10*3/uL (ref 150–400)
RBC: 3.65 MIL/uL — AB (ref 3.87–5.11)
RDW: 15.3 % (ref 11.5–15.5)
WBC: 10.9 10*3/uL — AB (ref 4.0–10.5)

## 2013-08-06 LAB — BASIC METABOLIC PANEL
ANION GAP: 13 (ref 5–15)
BUN: 32 mg/dL — ABNORMAL HIGH (ref 6–23)
CHLORIDE: 101 meq/L (ref 96–112)
CO2: 23 mEq/L (ref 19–32)
CREATININE: 1.18 mg/dL — AB (ref 0.50–1.10)
Calcium: 9.3 mg/dL (ref 8.4–10.5)
GFR calc Af Amer: 51 mL/min — ABNORMAL LOW (ref >90)
GFR calc non Af Amer: 44 mL/min — ABNORMAL LOW (ref >90)
Glucose, Bld: 194 mg/dL — ABNORMAL HIGH (ref 70–99)
POTASSIUM: 4.1 meq/L (ref 3.7–5.3)
Sodium: 137 mEq/L (ref 137–147)

## 2013-08-06 LAB — LIPID PANEL
CHOLESTEROL: 205 mg/dL — AB (ref 0–200)
HDL: 84 mg/dL (ref >39)
LDL Cholesterol: 96 mg/dL (ref 0–99)
TRIGLYCERIDES: 126 mg/dL (ref <150)
Total CHOL/HDL Ratio: 2.4 RATIO
VLDL: 25 mg/dL (ref 0–40)

## 2013-08-06 LAB — GLUCOSE, CAPILLARY
GLUCOSE-CAPILLARY: 228 mg/dL — AB (ref 70–99)
Glucose-Capillary: 171 mg/dL — ABNORMAL HIGH (ref 70–99)
Glucose-Capillary: 133 mg/dL — ABNORMAL HIGH (ref 70–99)
Glucose-Capillary: 201 mg/dL — ABNORMAL HIGH (ref 70–99)

## 2013-08-06 LAB — TROPONIN I

## 2013-08-06 MED ORDER — PREDNISONE 5 MG PO TABS
5.0000 mg | ORAL_TABLET | Freq: Once | ORAL | Status: AC
  Administered 2013-08-08: 5 mg via ORAL
  Filled 2013-08-06: qty 1

## 2013-08-06 MED ORDER — PREDNISONE 10 MG PO TABS
15.0000 mg | ORAL_TABLET | Freq: Once | ORAL | Status: AC
  Administered 2013-08-06: 15 mg via ORAL
  Filled 2013-08-06: qty 1

## 2013-08-06 MED ORDER — PREDNISONE 10 MG PO TABS
10.0000 mg | ORAL_TABLET | Freq: Once | ORAL | Status: AC
  Administered 2013-08-07: 10 mg via ORAL
  Filled 2013-08-06: qty 1

## 2013-08-06 NOTE — Progress Notes (Signed)
Subjective:  Vague complaints of tightness overnight. No shortness of breath.  Objective:  Vital Signs in the last 24 hours: BP 131/71  Pulse 55  Temp(Src) 97 F (36.1 C) (Oral)  Resp 16  Ht 5\' 4"  (1.626 m)  Wt 87.363 kg (192 lb 9.6 oz)  BMI 33.04 kg/m2  SpO2 100%  Physical Exam: Mildly obese black female in no acute distress Lungs:  Clear Cardiac:  Regular rhythm, normal S1 and S2, no S3, 2/6 systolic murmur of aortic stenosis Extremities:  No edema present  Intake/Output from previous day: 07/31 0701 - 08/01 0700 In: 240 [P.O.:240] Out: 2802 [Urine:2802]  Weight Filed Weights   08/05/13 1100 08/06/13 0500  Weight: 89.359 kg (197 lb) 87.363 kg (192 lb 9.6 oz)    Lab Results: Basic Metabolic Panel:  Recent Labs  08/05/13 1425 08/06/13 0424  NA 138 137  K 5.1 4.1  CL 103 101  CO2 18* 23  GLUCOSE 177* 194*  BUN 29* 32*  CREATININE 1.20* 1.18*   CBC:  Recent Labs  08/06/13 0424  WBC 10.9*  HGB 10.6*  HCT 31.8*  MCV 87.1  PLT 273   Cardiac Enzymes:  Recent Labs  08/05/13 1123 08/05/13 1701 08/05/13 2343  TROPONINI <0.30 <0.30 <0.30    Telemetry: Sinus rhythm  Assessment/Plan:  1. Progressive exertional tightness consistent with angina-troponins were negative overnight and EKG is unchanged 2. Diabetes mellitus 3. Mild aortic stenosis previously 4. Hyperlipidemia 5. Hypertension  Recommendations:  Catheterization on Monday.     Kerry Hough  MD [facility] Card[facility]  08/06/2013, 10:28 AM

## 2013-08-06 NOTE — Progress Notes (Signed)
UR Completed.  [name] [id number] 08/06/2013

## 2013-08-07 DIAGNOSIS — R079 Chest pain, unspecified: Secondary | ICD-10-CM | POA: Diagnosis not present

## 2013-08-07 DIAGNOSIS — I359 Nonrheumatic aortic valve disorder, unspecified: Secondary | ICD-10-CM | POA: Diagnosis not present

## 2013-08-07 DIAGNOSIS — I4949 Other premature depolarization: Secondary | ICD-10-CM | POA: Diagnosis not present

## 2013-08-07 DIAGNOSIS — I509 Heart failure, unspecified: Secondary | ICD-10-CM | POA: Diagnosis not present

## 2013-08-07 DIAGNOSIS — I5032 Chronic diastolic (congestive) heart failure: Secondary | ICD-10-CM | POA: Diagnosis not present

## 2013-08-07 DIAGNOSIS — I200 Unstable angina: Secondary | ICD-10-CM | POA: Diagnosis not present

## 2013-08-07 DIAGNOSIS — E785 Hyperlipidemia, unspecified: Secondary | ICD-10-CM | POA: Diagnosis not present

## 2013-08-07 LAB — GLUCOSE, CAPILLARY
GLUCOSE-CAPILLARY: 238 mg/dL — AB (ref 70–99)
GLUCOSE-CAPILLARY: 179 mg/dL — AB (ref 70–99)
Glucose-Capillary: 276 mg/dL — ABNORMAL HIGH (ref 70–99)
Glucose-Capillary: 271 mg/dL — ABNORMAL HIGH (ref 70–99)

## 2013-08-07 MED ORDER — ASPIRIN 81 MG PO CHEW
81.0000 mg | CHEWABLE_TABLET | ORAL | Status: AC
  Administered 2013-08-08: 81 mg via ORAL
  Filled 2013-08-07: qty 1

## 2013-08-07 MED ORDER — ENOXAPARIN SODIUM 100 MG/ML ~~LOC~~ SOLN
85.0000 mg | Freq: Two times a day (BID) | SUBCUTANEOUS | Status: DC
  Administered 2013-08-07: 85 mg via SUBCUTANEOUS
  Filled 2013-08-07 (×4): qty 1

## 2013-08-07 MED ORDER — SODIUM CHLORIDE 0.9 % IJ SOLN: 3.0000 mL | Freq: Two times a day (BID) | INTRAMUSCULAR | Status: DC

## 2013-08-07 MED ORDER — SODIUM CHLORIDE 0.9 % IJ SOLN: 3.0000 mL | INTRAMUSCULAR | Status: DC | PRN

## 2013-08-07 MED ORDER — SODIUM CHLORIDE 0.9 % IV SOLN
1.0000 mL/kg/h | INTRAVENOUS | Status: DC
  Administered 2013-08-08: 1 mL/kg/h via INTRAVENOUS

## 2013-08-07 MED ORDER — SODIUM CHLORIDE 0.9 % IV SOLN: 250.0000 mL | INTRAVENOUS | Status: DC | PRN

## 2013-08-07 NOTE — Progress Notes (Signed)
ANTICOAGULATION CONSULT NOTE - Follow Up Consult  Pharmacy Consult for Lovenox Indication: chest pain/ACS  Allergies  Allergen Reactions  . Penicillins Rash  . Other     Blood products  . Zocor [Simvastatin] Other (See Comments)    Leg cramps    Patient Measurements: Height: 5\' 4"  (162.6 cm) (from office visit 08/05/13) Weight: 189 lb (85.73 kg) IBW/kg (Calculated) : 54.7  Vital Signs: Temp: 98 F (36.7 C) (08/02 0500) BP: 126/47 mmHg (08/02 0500) Pulse Rate: 52 (08/02 0500)  Labs:  Recent Labs  08/05/13 1123 08/05/13 1200 08/05/13 1425 08/05/13 1701 08/05/13 2343 08/06/13 0424  HGB  --   --   --   --   --  10.6*  HCT  --   --   --   --   --  31.8*  PLT  --   --   --   --   --  273  APTT  --  22*  --   --   --   --   LABPROT  --  12.4  --   --   --   --   INR  --  0.92  --   --   --   --   CREATININE  --  1.09 1.20*  --   --  1.18*  TROPONINI <0.30  --   --  <0.30 <0.30  --     Estimated Creatinine Clearance: 44.3 ml/min (by C-G formula based on Cr of 1.18).   Medications: Lovenox 90mg  SQ q12h  Assessment: 73yof admitted for CP/USA now on Lovenox while awaiting cath scheduled for Monday.  - H/H low, Plts wnl - No significant bleeding reported - Wt: 86kg, CrCl 44 ml/min  Goal of Therapy:  Anti-Xa level 0.6-1 units/ml 4hrs after LMWH dose given Monitor platelets by anticoagulation protocol: Yes   Plan:  1. Decrease Lovenox to 85mg  SQ q12h 2. CBC q72hr 3. F/U cath Monday  Earleen Newport [id number] 08/07/2013,7:35 AM

## 2013-08-07 NOTE — Progress Notes (Signed)
Utilization Review Completed.   Lindora Alviar, RN, BSN Nurse Case Manager  

## 2013-08-07 NOTE — Progress Notes (Addendum)
Subjective:  Still some complaints of tightness but troponins are negative.  Objective:  Vital Signs in the last 24 hours: BP 126/47  Pulse 52  Temp(Src) 98 F (36.7 C) (Oral)  Resp 18  Ht 5\' 4"  (1.626 m)  Wt 85.73 kg (189 lb)  BMI 32.43 kg/m2  SpO2 99%  Physical Exam: Mildly obese black female in no acute distress Lungs:  Clear Cardiac:  Regular rhythm, normal S1 and S2, no S3, 2/6 systolic murmur of aortic stenosis Extremities:  No edema present  Intake/Output from previous day: 08/01 0701 - 08/02 0700 In: 840 [P.O.:840] Out: 2202 [Urine:2200; Stool:2]  Weight Filed Weights   08/05/13 1100 08/06/13 0500 08/07/13 0500  Weight: 89.359 kg (197 lb) 87.363 kg (192 lb 9.6 oz) 85.73 kg (189 lb)    Lab Results: Basic Metabolic Panel:  Recent Labs  08/05/13 1425 08/06/13 0424  NA 138 137  K 5.1 4.1  CL 103 101  CO2 18* 23  GLUCOSE 177* 194*  BUN 29* 32*  CREATININE 1.20* 1.18*   CBC:  Recent Labs  08/06/13 0424  WBC 10.9*  HGB 10.6*  HCT 31.8*  MCV 87.1  PLT 273   Cardiac Enzymes:  Recent Labs  08/05/13 1123 08/05/13 1701 08/05/13 2343  TROPONINI <0.30 <0.30 <0.30    Telemetry: Sinus rhythm  Assessment/Plan:  1. Progressive exertional tightness consistent with angina -clinically stable 2. Diabetes mellitus 3. Mild aortic stenosis previously 4. Hyperlipidemia 5. Hypertension  Recommendations:  Catheterization on Monday.Cardiac catheterization was discussed with the patient fully including risks of myocardial infarction, death, stroke, bleeding, arrhythmia, dye allergy, renal insufficiency or bleeding.  The patient understands and is willing to proceed.      Kerry Hough  MD [facility] Card[facility]  08/07/2013, 11:09 AM

## 2013-08-08 ENCOUNTER — Encounter (HOSPITAL_COMMUNITY)
Admission: AD | Disposition: A | Discharge status: Home / Self Care | Payer: Self-pay | Source: Ambulatory Visit | Attending: Internal Medicine

## 2013-08-08 ENCOUNTER — Encounter (HOSPITAL_COMMUNITY): Payer: Self-pay | Admitting: *Deleted

## 2013-08-08 DIAGNOSIS — I509 Heart failure, unspecified: Secondary | ICD-10-CM | POA: Diagnosis not present

## 2013-08-08 DIAGNOSIS — I359 Nonrheumatic aortic valve disorder, unspecified: Secondary | ICD-10-CM | POA: Diagnosis not present

## 2013-08-08 DIAGNOSIS — I5032 Chronic diastolic (congestive) heart failure: Secondary | ICD-10-CM

## 2013-08-08 DIAGNOSIS — I200 Unstable angina: Secondary | ICD-10-CM

## 2013-08-08 DIAGNOSIS — E785 Hyperlipidemia, unspecified: Secondary | ICD-10-CM | POA: Diagnosis not present

## 2013-08-08 DIAGNOSIS — I4949 Other premature depolarization: Secondary | ICD-10-CM | POA: Diagnosis not present

## 2013-08-08 DIAGNOSIS — R079 Chest pain, unspecified: Secondary | ICD-10-CM | POA: Diagnosis not present

## 2013-08-08 HISTORY — PX: LEFT HEART CATHETERIZATION WITH CORONARY ANGIOGRAM: SHX5451

## 2013-08-08 LAB — CBC
HCT: 30.6 % — ABNORMAL LOW (ref 36.0–46.0)
Hemoglobin: 10.2 g/dL — ABNORMAL LOW (ref 12.0–15.0)
MCH: 29.5 pg (ref 26.0–34.0)
MCHC: 33.3 g/dL (ref 30.0–36.0)
MCV: 88.4 fL (ref 78.0–100.0)
PLATELETS: 246 10*3/uL (ref 150–400)
RBC: 3.46 MIL/uL — ABNORMAL LOW (ref 3.87–5.11)
RDW: 15.5 % (ref 11.5–15.5)
WBC: 11.5 10*3/uL — ABNORMAL HIGH (ref 4.0–10.5)

## 2013-08-08 LAB — GLUCOSE, CAPILLARY
GLUCOSE-CAPILLARY: 142 mg/dL — AB (ref 70–99)
Glucose-Capillary: 181 mg/dL — ABNORMAL HIGH (ref 70–99)

## 2013-08-08 SURGERY — LEFT HEART CATHETERIZATION WITH CORONARY ANGIOGRAM
Anesthesia: LOCAL

## 2013-08-08 MED ORDER — MIDAZOLAM HCL 2 MG/2ML IJ SOLN
INTRAMUSCULAR | Status: AC
  Filled 2013-08-08: qty 2

## 2013-08-08 MED ORDER — NITROGLYCERIN 1 MG/10 ML FOR IR/CATH LAB
INTRA_ARTERIAL | Status: AC
  Filled 2013-08-08: qty 10

## 2013-08-08 MED ORDER — LIDOCAINE HCL (PF) 1 % IJ SOLN
INTRAMUSCULAR | Status: AC
  Filled 2013-08-08: qty 30

## 2013-08-08 MED ORDER — FENTANYL CITRATE 0.05 MG/ML IJ SOLN
INTRAMUSCULAR | Status: AC
  Filled 2013-08-08: qty 2

## 2013-08-08 MED ORDER — VERAPAMIL HCL 2.5 MG/ML IV SOLN
INTRAVENOUS | Status: AC
  Filled 2013-08-08: qty 2

## 2013-08-08 MED ORDER — HEPARIN (PORCINE) IN NACL 2-0.9 UNIT/ML-% IJ SOLN
INTRAMUSCULAR | Status: AC
  Filled 2013-08-08: qty 1000

## 2013-08-08 MED ORDER — HEPARIN SODIUM (PORCINE) 1000 UNIT/ML IJ SOLN
INTRAMUSCULAR | Status: AC
  Filled 2013-08-08: qty 1

## 2013-08-08 MED ORDER — SODIUM CHLORIDE 0.9 % IV SOLN: 1.0000 mL/kg/h | INTRAVENOUS | Status: AC

## 2013-08-08 NOTE — Progress Notes (Signed)
Patient admitted from cath lab with right radial TR band with 16cc air.  Right fingers blue, cath lab staff at bedside and immediately released 3 cc of air.  Oxygen saturations 100 % room air on right thumb, patient with complaints of finger numbness and tingling, good capillary refill.  TR band removed without complications.  2x2 and tegaderm applied.  Will continue to monitor.  Sanda Linger

## 2013-08-08 NOTE — Interval H&P Note (Signed)
History and Physical Interval Note:  08/08/2013 7:42 AM  [name]  has presented today for surgery, with the diagnosis of cp  The various methods of treatment have been discussed with the patient and family. After consideration of risks, benefits and other options for treatment, the patient has consented to  Procedure(s): LEFT HEART CATHETERIZATION WITH CORONARY ANGIOGRAM (N/A) as a surgical intervention .  The patient's history has been reviewed, patient examined, no change in status, stable for surgery.  I have reviewed the patient's chart and labs.  Questions were answered to the patient's satisfaction.    Cath Lab Visit (complete for each Cath Lab visit)  Clinical Evaluation Leading to the Procedure:   ACS: Yes.    Non-ACS:    Anginal Classification: CCS III  Anti-ischemic medical therapy: Minimal Therapy (1 class of medications)  Non-Invasive Test Results: No non-invasive testing performed  Prior CABG: No previous CABG       [name] 08/08/2013 7:42 AM

## 2013-08-08 NOTE — Progress Notes (Signed)
UR Completed Tavari Loadholt Graves-Bigelow, RN,BSN [phone]  

## 2013-08-08 NOTE — CV Procedure (Signed)
    Cardiac Catheterization Procedure Note  Name: ATHALIE WESTCOTT MRN: [id number] DOB: 27-Jul-1940  Procedure: Left Heart Cath, Selective Coronary Angiography, LV angiography  Indication: 73 yo BF with multiple risk factors for CAD presents with symptoms of progressive chest tightness.   Procedural Details: The right wrist was prepped, draped, and anesthetized with 1% lidocaine. Using the modified Seldinger technique, a 6 French slender sheath was introduced into the right radial artery. 3 mg of verapamil was administered through the sheath, weight-based unfractionated heparin was administered intravenously. Standard Judkins catheters were used for selective coronary angiography and left ventriculography. Catheter exchanges were performed over an exchange length guidewire. There were no immediate procedural complications. A TR band was used for radial hemostasis at the completion of the procedure.  The patient was transferred to the post catheterization recovery area for further monitoring.  Procedural Findings: Hemodynamics: AO 175/61 mean 100 mm Hg LV 175/20 mm Hg  No significant AV gradient.  Coronary angiography: Coronary dominance: right  Left mainstem: Normal  Left anterior descending (LAD): Normal  Left circumflex (LCx): Normal  Right coronary artery (RCA): Normal  Left ventriculography: Not done  Final Conclusions:   1. Normal coronary anatomy 2. No significant AV gradient.  Recommendations: Medical management.  Zoee Heeney [place], Maynard  08/08/2013, 8:21 AM

## 2013-08-08 NOTE — Discharge Instructions (Signed)
No driving for 48 hours. No lifting over 5 lbs for 1 week. No sexual activity for 1 week. Keep procedure site clean & dry. If you notice increased pain, swelling, bleeding or pus, call/return!  You may shower, but no soaking baths/hot tubs/pools for 1 week.   

## 2013-08-08 NOTE — Progress Notes (Signed)
Reviewed discharge instructions with patient and she stated her understanding.  Awaiting family for discharge home.  Sanda Linger

## 2013-08-08 NOTE — Progress Notes (Signed)
Patient Name: [name] Date of Encounter: 08/08/2013     Active Problems:   Unstable angina    SUBJECTIVE  The patient is doing well post catheter.  She had a normal cardiac catheterization earlier today by Dr. Martinique.  There was no aortic valve gradient.  He does have a systolic murmur at the base.  CURRENT MEDS . allopurinol  300 mg Oral Daily  . aspirin EC  81 mg Oral Daily  . atorvastatin  40 mg Oral Daily  . clobetasol cream   Topical BID  . folic acid  1 mg Oral Daily  . furosemide  40 mg Intravenous Once  . insulin aspart  0-9 Units Subcutaneous TID WC  . levothyroxine  200 mcg Oral QAC breakfast  . losartan  50 mg Oral Daily  . metoprolol succinate  25 mg Oral Daily  . pregabalin  150 mg Oral QHS  . pregabalin  75 mg Oral q morning - 10a  . silver sulfADIAZINE  1 application Topical Daily  . sodium chloride  3 mL Intravenous Q12H    OBJECTIVE  Filed Vitals:   08/07/13 2100 08/08/13 0500 08/08/13 0752 08/08/13 1000  BP: 126/63 133/52    Pulse: 60 53 52 41  Temp: 98.2 F (36.8 C) 98.1 F (36.7 C)    TempSrc:      Resp: 16 16    Height:      Weight:  187 lb 3.2 oz (84.913 kg)    SpO2: 100% 100%      Intake/Output Summary (Last 24 hours) at 08/08/13 1050 Last data filed at 08/08/13 0500  Gross per 24 hour  Intake   1080 ml  Output   1877 ml  Net   -797 ml   Filed Weights   08/06/13 0500 08/07/13 0500 08/08/13 0500  Weight: 192 lb 9.6 oz (87.363 kg) 189 lb (85.73 kg) 187 lb 3.2 oz (84.913 kg)    PHYSICAL EXAM  General: Pleasant, NAD. Neuro: Alert and oriented X 3. Moves all extremities spontaneously. Psych: Normal affect. HEENT:  Normal  Neck: Supple without bruits or JVD. Lungs:  Resp regular and unlabored, CTA. Heart: Sinus bradycardia.  Grade 1/6 systolic ejection murmur at the base. Abdomen: Soft, non-tender, non-distended, BS + x 4.  Extremities: No clubbing, cyanosis or edema. DP/PT/Radials 2+ and equal bilaterally.  Right  radial pulse present.  Accessory Clinical Findings  CBC  Recent Labs  08/06/13 0424 08/08/13 0555  WBC 10.9* 11.5*  HGB 10.6* 10.2*  HCT 31.8* 30.6*  MCV 87.1 88.4  PLT 273 0000000   Basic Metabolic Panel  Recent Labs  08/05/13 1425 08/06/13 0424  NA 138 137  K 5.1 4.1  CL 103 101  CO2 18* 23  GLUCOSE 177* 194*  BUN 29* 32*  CREATININE 1.20* 1.18*  CALCIUM 9.4 9.3   Liver Function Tests  Recent Labs  08/05/13 1200  AST 50*  ALT 53*  ALKPHOS 79  BILITOT 0.4  PROT 7.8  ALBUMIN 3.6   No results found for this basename: LIPASE, AMYLASE,  in the last 72 hours Cardiac Enzymes  Recent Labs  08/05/13 1123 08/05/13 1701 08/05/13 2343  TROPONINI <0.30 <0.30 <0.30   BNP No components found with this basename: POCBNP,  D-Dimer No results found for this basename: DDIMER,  in the last 72 hours Hemoglobin A1C No results found for this basename: HGBA1C,  in the last 72 hours Fasting Lipid Panel  Recent Labs  08/06/13 0424  CHOL 205*  HDL 84  LDLCALC 96  TRIG 126  CHOLHDL 2.4   Thyroid Function Tests No results found for this basename: TSH, T4TOTAL, FREET3, T3FREE, THYROIDAB,  in the last 72 hours  TELE  Sinus bradycardia  ECG    Radiology/Studies  Dg Chest 2 View  08/03/2013   CLINICAL DATA:  Chest pain for 3-4 days  EXAM: CHEST  2 VIEW  COMPARISON:  06/19/2013  FINDINGS: The heart size and vascular pattern are normal. There is minimal scarring in the lingula, versus minimal subsegmental atelectasis that is stable from the prior study. No pleural effusion.  IMPRESSION: No active cardiopulmonary disease.   Electronically Signed   By: Skipper Cliche M.D.   On: 08/03/2013 16:37    ASSESSMENT AND PLAN 1. Progressive exertional tightness consistent with angina -clinically stable.  Cardiac catheter today shows normal coronary arteries 2. Diabetes mellitus  3. Mild aortic stenosis previously.  There was no gradient today across the aortic valve  4.  Hyperlipidemia  5. Hypertension  Plan: Okay for discharge later today  Signed, Darlin Coco MD

## 2013-08-08 NOTE — Discharge Summary (Signed)
Discharge Summary   Patient ID: [name],  MRN: [id number], DOB/AGE: [birth year] 73 y.o.  Admit date: 08/05/2013 Discharge date: 08/08/2013  Primary Care Provider: Jonathon Jordan A Primary Cardiologist: Dr. Candee Furbish  Discharge Diagnoses Principal Problem:   Chest pain Active Problems:   Aortic stenosis   Hyperlipidemia   PVC (premature ventricular contraction)   HTN (hypertension)   Chronic congestive heart failure with left ventricular diastolic dysfunction   Allergies Allergies  Allergen Reactions  . Penicillins Rash  . Other     Blood products  . Zocor [Simvastatin] Other (See Comments)    Leg cramps    Procedures  Transthoracic Echocardiography LV EF: 65% - 70%  ------------------------------------------------------------------- Indications: Chest pain 786.51.  ------------------------------------------------------------------- History: PMH: Syncope. Risk factors: Hypertension. Dyslipidemia.  ------------------------------------------------------------------- Study Conclusions  - Left ventricle: The cavity size was normal. Wall thickness was normal. Systolic function was vigorous. The estimated ejection fraction was in the range of 65% to 70%. Wall motion was normal; there were no regional wall motion abnormalities. - Aortic valve: There was mild stenosis. There was trivial regurgitation. Valve area (VTI): 1.38 cm^2. Valve area (Vmax): 1.48 cm^2. - Mitral valve: There was mild regurgitation. - Left atrium: The atrium was mildly dilated. - Right atrium: The atrium was mildly dilated.    Cardiac Catheterization Procedure Note Procedural Findings:  Hemodynamics:  AO 175/61 mean 100 mm Hg  LV 175/20 mm Hg  No significant AV gradient.  Coronary angiography:  Coronary dominance: right  Left mainstem: Normal  Left anterior descending (LAD): Normal  Left circumflex (LCx): Normal  Right coronary artery (RCA): Normal  Left ventriculography:  Not done  Final Conclusions:  1. Normal coronary anatomy  2. No significant AV gradient.  Recommendations: Medical management.   Hospital Course  The patient is a 73 year old female with past medical history of carotid stenosis, chronic diastolic dysfunction, hypertension, diabetes, hyperlipidemia and a history of syncope. She was recently seen by her PCP on 08/03/2013 during which visit she complained of chest pain. She was referred for cardiology follow up on 08/05/2013 with Richardson Dopp and Dr. Caryl Comes. According to the patient, she has been experiencing exertional chest heaviness radiating to her left jaw. This has been getting worse for the past week. She also has symptom at rest as well. The chest pain has been associated with dyspnea, diaphoresis and nausea. Her symptom was concerning for unstable angina, and therefore she was admitted to the hospital from the office for potential cardiac catheterization. IV heparin and BB was started. Echocardiogram was obtained on 7/31 which showed EF 65-70%, no regional wall motion abnormality, mild aortic valve stenosis.   Patient was originally hesitant to undergo cardiac catheterization on 7/1, after discussing with the family, cardiac cath was scheduled for 08/08/2013. Serial troponin was obtained which were negative x3. She had some hyperkalemia secondary to hemolysis. Repeat labs shows potassium around 4.1. Although she did does have a history of chronic diastolic heart failure, and her initial proBNP was 77.5 and she did not have any significant heart failure symptoms on exam. Her white blood cell count trended up over the weekend up to 11.5 on 8/3, however patient did not have any sign of fever or chill. She eventually underwent cardiac catheterization in the morning of 8/3 which showed normal coronary artery anatomy, no significant aortic valve gradient. Medical management was recommended.  Patient was seen post cath, at which time, she denies any  significant chest discomfort or shortness of breath. She is deemed stable  for discharge from cardiology perspective. I will arrange office followup with Dr. Marlou Porch in 1 month. Of note, patient does complain of burning sensation and swelling in LLE worse than RLE, she has no h/o blood clot. Heart rate in the 50-60s. It appears to be tender in pretibial area as well, chronic in nature (going on for years), suspicion for DVT is low. I have recommended the patient to continue followup with her PCP to evaluate for potential etiology of LE edema including cellulitis, venous insufficiency and chronic DM neuropathy. If LE swelling worsen, will need to obtain venous u/s. I have advised patient to seek medical attention if have any erythema traveling up or fever or chill of lower extremity.   Discharge Vitals Blood pressure 136/52, pulse 62, temperature 98.1 F (36.7 C), temperature source Oral, resp. rate 16, height 5\' 4"  (1.626 m), weight 187 lb 3.2 oz (84.913 kg), SpO2 99.00%.  Filed Weights   08/06/13 0500 08/07/13 0500 08/08/13 0500  Weight: 192 lb 9.6 oz (87.363 kg) 189 lb (85.73 kg) 187 lb 3.2 oz (84.913 kg)    Labs  CBC  Recent Labs  08/06/13 0424 08/08/13 0555  WBC 10.9* 11.5*  HGB 10.6* 10.2*  HCT 31.8* 30.6*  MCV 87.1 88.4  PLT 273 0000000   Basic Metabolic Panel  Recent Labs  08/05/13 1425 08/06/13 0424  NA 138 137  K 5.1 4.1  CL 103 101  CO2 18* 23  GLUCOSE 177* 194*  BUN 29* 32*  CREATININE 1.20* 1.18*  CALCIUM 9.4 9.3   Cardiac Enzymes  Recent Labs  08/05/13 1701 08/05/13 2343  TROPONINI <0.30 <0.30   Fasting Lipid Panel  Recent Labs  08/06/13 0424  CHOL 205*  HDL 84  LDLCALC 96  TRIG 126  CHOLHDL 2.4    Disposition  Pt is being discharged home today in good condition.  Follow-up Plans & Appointments      Follow-up Information   Follow up with Candee Furbish, MD. (Office will call to schedule follow up. If you do not hear from [place] in 2 days, please  give [place] a call)    Specialty:  Cardiology   Contact information:   [address] [place] [address] [phone]       Follow up with Lilian Coma, MD. (Follow up with your PCP for LLE swelling and DM control)    Specialty:  Family Medicine   Contact information:   [address] [place] [address] [phone]       Discharge Medications    Medication List         allopurinol 300 MG tablet  Commonly known as:  ZYLOPRIM  Take 300 mg by mouth daily.     atorvastatin 40 MG tablet  Commonly known as:  LIPITOR  Take 40 mg by mouth daily.     cholecalciferol 1000 UNITS tablet  Commonly known as:  VITAMIN D  Take 400 Units by mouth daily. 2 capsule     clobetasol cream 0.05 %  Commonly known as:  TEMOVATE  Apply 1 application topically daily as needed. For rash on leg     cyclobenzaprine 10 MG tablet  Commonly known as:  FLEXERIL  Take 10 mg by mouth 3 (three) times daily as needed for muscle spasms.     diclofenac sodium 1 % Gel  Commonly known as:  VOLTAREN  Apply 2 g topically 4 (four) times daily. Rub into affected area of foot 2  to 4 times daily     ferrous fumarate 325 (106 FE) MG Tabs tablet  Commonly known as:  HEMOCYTE - 106 mg FE  Take 1 tablet by mouth.     folic acid 1 MG tablet  Commonly known as:  FOLVITE  Take 1 mg by mouth daily.     ketoconazole 2 % cream  Commonly known as:  NIZORAL  Apply 1 application topically daily as needed for irritation. Use on the bottom of feet     levothyroxine 200 MCG tablet  Commonly known as:  SYNTHROID, LEVOTHROID  Take 200 mcg by mouth daily before breakfast.     losartan 50 MG tablet  Commonly known as:  COZAAR  Take 50 mg by mouth daily.     multivitamin per tablet  Take 1 tablet by mouth daily.     MYRBETRIQ 50 MG Tb24 tablet  Generic drug:  mirabegron ER  Take 50 mg by mouth daily.     predniSONE 5 MG tablet  Commonly known as:  DELTASONE  Take 5 mg  by mouth See admin instructions. Take 4 tablet every day for one week, then decrease every week after that. Patient states she only has three tablets left as of today (08-05-13)     pregabalin 75 MG capsule  Commonly known as:  LYRICA  Take 75 mg by mouth 2 (two) times daily.     silver sulfADIAZINE 1 % cream  Commonly known as:  SILVADENE  Apply 1 application topically daily.     traMADol 50 MG tablet  Commonly known as:  ULTRAM  Take 50 mg by mouth every 6 (six) hours as needed for moderate pain.     triamcinolone cream 0.1 %  Commonly known as:  KENALOG  Apply 1 application topically daily as needed. For foot rash per patient     zolpidem 10 MG tablet  Commonly known as:  AMBIEN  Take 10 mg by mouth at bedtime as needed for sleep.         Duration of Discharge Encounter   Greater than 30 minutes including physician time.  Signed, Almyra Deforest PA-C 08/08/2013, 2:06 PM

## 2013-08-08 NOTE — H&P (View-Only) (Signed)
Subjective:  Still wasn't be complaints of tightness but troponins are negative.  Objective:  Vital Signs in the last 24 hours: BP 126/47  Pulse 52  Temp(Src) 98 F (36.7 C) (Oral)  Resp 18  Ht 5\' 4"  (1.626 m)  Wt 85.73 kg (189 lb)  BMI 32.43 kg/m2  SpO2 99%  Physical Exam: Mildly obese black female in no acute distress Lungs:  Clear Cardiac:  Regular rhythm, normal S1 and S2, no S3, 2/6 systolic murmur of aortic stenosis Extremities:  No edema present  Intake/Output from previous day: 08/01 0701 - 08/02 0700 In: 840 [P.O.:840] Out: 2202 [Urine:2200; Stool:2]  Weight Filed Weights   08/05/13 1100 08/06/13 0500 08/07/13 0500  Weight: 89.359 kg (197 lb) 87.363 kg (192 lb 9.6 oz) 85.73 kg (189 lb)    Lab Results: Basic Metabolic Panel:  Recent Labs  08/05/13 1425 08/06/13 0424  NA 138 137  K 5.1 4.1  CL 103 101  CO2 18* 23  GLUCOSE 177* 194*  BUN 29* 32*  CREATININE 1.20* 1.18*   CBC:  Recent Labs  08/06/13 0424  WBC 10.9*  HGB 10.6*  HCT 31.8*  MCV 87.1  PLT 273   Cardiac Enzymes:  Recent Labs  08/05/13 1123 08/05/13 1701 08/05/13 2343  TROPONINI <0.30 <0.30 <0.30    Telemetry: Sinus rhythm  Assessment/Plan:  1. Progressive exertional tightness consistent with angina -clinically stable 2. Diabetes mellitus 3. Mild aortic stenosis previously 4. Hyperlipidemia 5. Hypertension  Recommendations:  Catheterization on Monday.Cardiac catheterization was discussed with the patient fully including risks of myocardial infarction, death, stroke, bleeding, arrhythmia, dye allergy, renal insufficiency or bleeding.  The patient understands and is willing to proceed.      Kerry Hough  MD [facility] Cardiology  08/07/2013, 11:09 AM

## 2013-08-10 DIAGNOSIS — IMO0001 Reserved for inherently not codable concepts without codable children: Secondary | ICD-10-CM | POA: Diagnosis not present

## 2013-08-10 DIAGNOSIS — T148XXA Other injury of unspecified body region, initial encounter: Secondary | ICD-10-CM | POA: Diagnosis not present

## 2013-08-10 DIAGNOSIS — M25539 Pain in unspecified wrist: Secondary | ICD-10-CM | POA: Diagnosis not present

## 2013-08-17 DIAGNOSIS — M545 Low back pain, unspecified: Secondary | ICD-10-CM | POA: Diagnosis not present

## 2013-08-17 DIAGNOSIS — M5137 Other intervertebral disc degeneration, lumbosacral region: Secondary | ICD-10-CM | POA: Diagnosis not present

## 2013-08-17 DIAGNOSIS — IMO0002 Reserved for concepts with insufficient information to code with codable children: Secondary | ICD-10-CM | POA: Diagnosis not present

## 2013-08-17 DIAGNOSIS — D320 Benign neoplasm of cerebral meninges: Secondary | ICD-10-CM | POA: Diagnosis not present

## 2013-08-29 DIAGNOSIS — I6529 Occlusion and stenosis of unspecified carotid artery: Secondary | ICD-10-CM | POA: Diagnosis not present

## 2013-08-29 DIAGNOSIS — N183 Chronic kidney disease, stage 3 unspecified: Secondary | ICD-10-CM | POA: Diagnosis not present

## 2013-08-29 DIAGNOSIS — D320 Benign neoplasm of cerebral meninges: Secondary | ICD-10-CM | POA: Diagnosis not present

## 2013-08-29 DIAGNOSIS — M109 Gout, unspecified: Secondary | ICD-10-CM | POA: Diagnosis not present

## 2013-08-29 DIAGNOSIS — I519 Heart disease, unspecified: Secondary | ICD-10-CM | POA: Diagnosis not present

## 2013-08-29 DIAGNOSIS — G894 Chronic pain syndrome: Secondary | ICD-10-CM | POA: Diagnosis not present

## 2013-08-29 DIAGNOSIS — I209 Angina pectoris, unspecified: Secondary | ICD-10-CM | POA: Diagnosis not present

## 2013-08-29 DIAGNOSIS — H409 Unspecified glaucoma: Secondary | ICD-10-CM | POA: Diagnosis not present

## 2013-08-31 ENCOUNTER — Ambulatory Visit (INDEPENDENT_AMBULATORY_CARE_PROVIDER_SITE_OTHER): Payer: Medicare Other | Admitting: Podiatrist

## 2013-08-31 ENCOUNTER — Encounter: Payer: Self-pay | Admitting: Podiatrist

## 2013-08-31 VITALS — BP 120/61 | HR 84 | Resp 16

## 2013-08-31 DIAGNOSIS — L97509 Non-pressure chronic ulcer of other part of unspecified foot with unspecified severity: Secondary | ICD-10-CM | POA: Diagnosis not present

## 2013-08-31 DIAGNOSIS — L97523 Non-pressure chronic ulcer of other part of left foot with necrosis of muscle: Secondary | ICD-10-CM

## 2013-08-31 DIAGNOSIS — M2040 Other hammer toe(s) (acquired), unspecified foot: Secondary | ICD-10-CM | POA: Diagnosis not present

## 2013-08-31 DIAGNOSIS — M2042 Other hammer toe(s) (acquired), left foot: Secondary | ICD-10-CM

## 2013-08-31 MED ORDER — FLUOCINONIDE 0.05 % EX OINT: 1.0000 "application " | TOPICAL_OINTMENT | Freq: Two times a day (BID) | CUTANEOUS | Status: DC

## 2013-08-31 NOTE — Patient Instructions (Signed)
Diabetes and Foot Care Diabetes may cause you to have problems because of poor blood supply (circulation) to your feet and legs. This may cause the skin on your feet to become thinner, break easier, and heal more slowly. Your skin may become dry, and the skin may peel and crack. You may also have nerve damage in your legs and feet causing decreased feeling in them. You may not notice minor injuries to your feet that could lead to infections or more serious problems. Taking care of your feet is one of the most important things you can do for yourself.  HOME CARE INSTRUCTIONS  Wear shoes at all times, even in the house. Do not go barefoot. Bare feet are easily injured.  Check your feet daily for blisters, cuts, and redness. If you cannot see the bottom of your feet, use a mirror or ask someone for help.  Wash your feet with warm water (do not use hot water) and mild soap. Then pat your feet and the areas between your toes until they are completely dry. Do not soak your feet as this can dry your skin.  Apply a moisturizing lotion or petroleum jelly (that does not contain alcohol and is unscented) to the skin on your feet and to dry, brittle toenails. Do not apply lotion between your toes.  Trim your toenails straight across. Do not dig under them or around the cuticle. File the edges of your nails with an emery board or nail file.  Do not cut corns or calluses or try to remove them with medicine.  Wear clean socks or stockings every day. Make sure they are not too tight. Do not wear knee-high stockings since they may decrease blood flow to your legs.  Wear shoes that fit properly and have enough cushioning. To break in new shoes, wear them for just a few hours a day. This prevents you from injuring your feet. Always look in your shoes before you put them on to be sure there are no objects inside.  Do not cross your legs. This may decrease the blood flow to your feet.  If you find a minor scrape,  cut, or break in the skin on your feet, keep it and the skin around it clean and dry. These areas may be cleansed with mild soap and water. Do not cleanse the area with peroxide, alcohol, or iodine.  When you remove an adhesive bandage, be sure not to damage the skin around it.  If you have a wound, look at it several times a day to make sure it is healing.  Do not use heating pads or hot water bottles. They may burn your skin. If you have lost feeling in your feet or legs, you may not know it is happening until it is too late.  Make sure your health care provider performs a complete foot exam at least annually or more often if you have foot problems. Report any cuts, sores, or bruises to your health care provider immediately. SEEK MEDICAL CARE IF:   You have an injury that is not healing.  You have cuts or breaks in the skin.  You have an ingrown nail.  You notice redness on your legs or feet.  You feel burning or tingling in your legs or feet.  You have pain or cramps in your legs and feet.  Your legs or feet are numb.  Your feet always feel cold. SEEK IMMEDIATE MEDICAL CARE IF:   There is increasing redness,   swelling, or pain in or around a wound.  There is a red line that goes up your leg.  Pus is coming from a wound.  You develop a fever or as directed by your health care provider.  You notice a bad smell coming from an ulcer or wound. Document Released: 12/21/1999 Document Revised: 08/25/2012 Document Reviewed: 06/01/2012 Exit[employer] Patient Information 2015 [employer]. This information is not intended to replace advice given to you by your health care provider. Make sure you discuss any questions you have with your health care provider.  

## 2013-08-31 NOTE — Progress Notes (Signed)
  Subjective: Patient presents today for followup of left second toe ulcer. She states that it has gotten worse and she's concerned it may be broken down again. She also relates that the rash on her right foot has also gotten worse in the last couple weeks. Denies any systemic or local signs of infection.   Objective: Neurovascular status is unchanged from her last visit with palpable pedal pulses and neurological sensation decreased.. Contracted hammertoe left second digit is present with an open ulceration on the dorsal aspect. At the proximal interphalangeal joint. With debridement the ulcer is down to capsule.  periwound hyperkeratotic tissue is present. Ulcer itself measures 7 mm in diameter and 3 mm in depth. No redness, no streaking, no malodor no lymphangitis is present.  Assessment: Relapsed Ulcer left second toe dorsally proximal interphalangeal joint, dermatitis right foot  Plan: The ulceration was thoroughly debrided without complication. Wound wash was utilized and Iodosorb and a dressing was applied. She was given instructions to continue wound care with Silvadene cream on the toe. Also recommended surgery to straighten out the left second toe to prevent this from being an option. We'll discuss this at the next visit. Also prescription for fluocinonide was called into her pharmacy. She will use these on her right foot rash.

## 2013-09-02 DIAGNOSIS — M5137 Other intervertebral disc degeneration, lumbosacral region: Secondary | ICD-10-CM | POA: Diagnosis not present

## 2013-09-02 DIAGNOSIS — M545 Low back pain, unspecified: Secondary | ICD-10-CM | POA: Diagnosis not present

## 2013-09-02 DIAGNOSIS — IMO0002 Reserved for concepts with insufficient information to code with codable children: Secondary | ICD-10-CM | POA: Diagnosis not present

## 2013-09-02 DIAGNOSIS — Z6835 Body mass index (BMI) 35.0-35.9, adult: Secondary | ICD-10-CM | POA: Diagnosis not present

## 2013-09-09 DIAGNOSIS — Z6835 Body mass index (BMI) 35.0-35.9, adult: Secondary | ICD-10-CM | POA: Diagnosis not present

## 2013-09-09 DIAGNOSIS — E785 Hyperlipidemia, unspecified: Secondary | ICD-10-CM | POA: Diagnosis not present

## 2013-09-09 DIAGNOSIS — Z23 Encounter for immunization: Secondary | ICD-10-CM | POA: Diagnosis not present

## 2013-09-09 DIAGNOSIS — E1149 Type 2 diabetes mellitus with other diabetic neurological complication: Secondary | ICD-10-CM | POA: Diagnosis not present

## 2013-09-09 DIAGNOSIS — E669 Obesity, unspecified: Secondary | ICD-10-CM | POA: Diagnosis not present

## 2013-09-09 DIAGNOSIS — E1142 Type 2 diabetes mellitus with diabetic polyneuropathy: Secondary | ICD-10-CM | POA: Diagnosis not present

## 2013-09-09 DIAGNOSIS — E039 Hypothyroidism, unspecified: Secondary | ICD-10-CM | POA: Diagnosis not present

## 2013-09-14 ENCOUNTER — Encounter: Payer: Self-pay | Admitting: Podiatrist

## 2013-09-14 ENCOUNTER — Ambulatory Visit (INDEPENDENT_AMBULATORY_CARE_PROVIDER_SITE_OTHER): Payer: Medicare Other | Admitting: Podiatrist

## 2013-09-14 VITALS — BP 137/80 | HR 70 | Resp 16

## 2013-09-14 DIAGNOSIS — L97509 Non-pressure chronic ulcer of other part of unspecified foot with unspecified severity: Secondary | ICD-10-CM | POA: Diagnosis not present

## 2013-09-14 DIAGNOSIS — L97521 Non-pressure chronic ulcer of other part of left foot limited to breakdown of skin: Secondary | ICD-10-CM

## 2013-09-14 DIAGNOSIS — I200 Unstable angina: Secondary | ICD-10-CM

## 2013-09-15 DIAGNOSIS — M47817 Spondylosis without myelopathy or radiculopathy, lumbosacral region: Secondary | ICD-10-CM | POA: Diagnosis not present

## 2013-09-15 DIAGNOSIS — M5126 Other intervertebral disc displacement, lumbar region: Secondary | ICD-10-CM | POA: Diagnosis not present

## 2013-09-15 DIAGNOSIS — IMO0002 Reserved for concepts with insufficient information to code with codable children: Secondary | ICD-10-CM | POA: Diagnosis not present

## 2013-09-15 NOTE — Progress Notes (Signed)
Subjective: Patient presents today for followup of left second toe ulcer. She states the ulcer has improved and she isn't haven't any problem out of the toe now.    Objective: Neurovascular status is unchanged from her last visit with palpable pedal pulses and neurological sensation decreased.. Contracted hammertoe left second digit is present with improved ulceration on the dorsal aspect. At the proximal interphalangeal joint.  Ulcer itself measures 7 mm in diameter and 3 mm in depth. No redness, no streaking, no malodor no lymphangitis is present.  Does appear to be healing from deep to superficial.  Assessment: Relapsed Ulcer left second toe dorsally proximal interphalangeal joint, dermatitis right foot   Plan: The ulceration was lightly debrided without complication. Wound wash was utilized and Iodosorb and a dressing was applied. She was given instructions to continue wound care with Silvadene cream on the toe. Also recommended surgery to straighten out the left second toe to prevent this from being an option. We'll discuss this in detail at the next visit.  Right foot rash also appears improved with the use of the fluocinonide.

## 2013-09-16 ENCOUNTER — Encounter: Payer: Self-pay | Admitting: Cardiology

## 2013-09-16 ENCOUNTER — Ambulatory Visit (INDEPENDENT_AMBULATORY_CARE_PROVIDER_SITE_OTHER): Payer: Medicare Other | Admitting: Cardiology

## 2013-09-16 VITALS — BP 110/58 | HR 80 | Ht 64.0 in | Wt 206.0 lb

## 2013-09-16 DIAGNOSIS — I350 Nonrheumatic aortic (valve) stenosis: Secondary | ICD-10-CM

## 2013-09-16 DIAGNOSIS — I200 Unstable angina: Secondary | ICD-10-CM

## 2013-09-16 DIAGNOSIS — E7800 Pure hypercholesterolemia, unspecified: Secondary | ICD-10-CM | POA: Diagnosis not present

## 2013-09-16 DIAGNOSIS — I4949 Other premature depolarization: Secondary | ICD-10-CM | POA: Diagnosis not present

## 2013-09-16 DIAGNOSIS — I10 Essential (primary) hypertension: Secondary | ICD-10-CM | POA: Diagnosis not present

## 2013-09-16 DIAGNOSIS — I359 Nonrheumatic aortic valve disorder, unspecified: Secondary | ICD-10-CM

## 2013-09-16 DIAGNOSIS — I493 Ventricular premature depolarization: Secondary | ICD-10-CM

## 2013-09-16 NOTE — Progress Notes (Signed)
[facility] [address], [address], [place]  [address] Phone: [phone] Fax:  [phone]  Date:  09/16/2013   ID:  [name], DOB 1940-05-08, MRN [id number]  PCP:  Lilian Coma, MD   History of Present Illness: [name] is a 73 y.o. female with history of carotid artery disease, with bilateral ICA stenosis of up to 40%, mild aortic insufficiency/stenosis, diastolic dysfunction and type 2 diabetes. Had heart catheterization on 08/08/13 which was normal. EF 70%, mild aortic stenosis on echocardiogram EKG - nonspecific ST-T wave changes. Patient has fairly good functional status. Previously she had an episode of syncope where she was standing in the kitchen/working felt dizzy fell then returned to consciousness as she hit the floor. currently she describes no significant warning. She woke up and was in her usual state. She did need some help getting up off of the floor. No diaphoresis. She is a Restaurant manager, fast food. Hemoglobin 12.0, creatinine 1.36.   Cardiac catheterization 08/08/13-normal coronary arteries  Event monitor 2/14 showed no adverse arrhythmias.PVC's  Nuclear stress test: 03/05/12-low risk, no ischemia, normal EF  Echocardiogram: 08/08/13 - EF 70%, mild aortic stenosis-    Wt Readings from Last 3 Encounters:  09/16/13 206 lb (93.441 kg)  08/08/13 187 lb 3.2 oz (84.913 kg)  08/08/13 187 lb 3.2 oz (84.913 kg)     Past Medical History  Diagnosis Date  . Rheumatoid arthritis(714.0)   . Glaucoma   . HTN (hypertension)   . Hypothyroidism   . Diabetes mellitus   . Hypercholesteremia   . Chronic pain   . Allergic rhinitis   . Peripheral neuropathy   . Osteoarthritis   . Diverticulosis   . Hemorrhoids     internal  . PPD positive     6 months ago  . Anemia   . Sickle cell trait   . Meningioma 2008  . CKD (chronic kidney disease), stage III   . Aortic stenosis 12/22/2012    Mild 3/14-2.35 m/s peak velocity  . Hyperlipidemia 12/22/2012  .  PVC (premature ventricular contraction) 12/22/2012    Past Surgical History  Procedure Laterality Date  . Joint replacement  2001    left TKR  . Fistula repair  03/2010    partial colectomy  . Intracranial surgery  11/2007    removal of meningioma  . Total thyroidectomy  1992  . Rotator cuff repair  1999    BIlateral  . Sigmoidectomy  2012  . Abdominal hysterectomy  1978    TAH,& BSO 1  . Tonsillectomy and adenoidectomy    . Wrist sx      right     Current Outpatient Prescriptions  Medication Sig Dispense Refill  . allopurinol (ZYLOPRIM) 300 MG tablet Take 300 mg by mouth daily.      Marland Kitchen atorvastatin (LIPITOR) 40 MG tablet Take 40 mg by mouth daily.      . cholecalciferol (VITAMIN D) 1000 UNITS tablet Take 400 Units by mouth daily. 2 capsule      . clobetasol cream (TEMOVATE) AB-123456789 % Apply 1 application topically daily as needed. For rash on leg      . cyclobenzaprine (FLEXERIL) 10 MG tablet Take 10 mg by mouth 3 (three) times daily as needed for muscle spasms.       . diclofenac sodium (VOLTAREN) 1 % GEL Apply 2 g topically 4 (four) times daily. Rub into affected area of foot 2 to 4 times daily      .  ferrous fumarate (HEMOCYTE - 106 MG FE) 325 (106 FE) MG TABS Take 1 tablet by mouth.      . fluocinonide ointment (LIDEX) AB-123456789 % Apply 1 application topically 2 (two) times daily.  60 g  2  . folic acid (FOLVITE) 1 MG tablet Take 1 mg by mouth daily.      Marland Kitchen HYDROcodone-acetaminophen (NORCO/VICODIN) 5-325 MG per tablet       . ketoconazole (NIZORAL) 2 % cream Apply 1 application topically daily as needed for irritation. Use on the bottom of feet      . levothyroxine (SYNTHROID, LEVOTHROID) 200 MCG tablet Take 200 mcg by mouth daily before breakfast.      . losartan (COZAAR) 50 MG tablet Take 50 mg by mouth daily.      . multivitamin (THERAGRAN) per tablet Take 1 tablet by mouth daily.      Marland Kitchen MYRBETRIQ 50 MG TB24 tablet Take 50 mg by mouth daily.       . predniSONE (DELTASONE) 5 MG  tablet Take 5 mg by mouth See admin instructions. Take 4 tablet every day for one week, then decrease every week after that. Patient states she only has three tablets left as of today (08-05-13)      . pregabalin (LYRICA) 75 MG capsule Take 75 mg by mouth 2 (two) times daily.      . silver sulfADIAZINE (SILVADENE) 1 % cream Apply 1 application topically daily.      . traMADol (ULTRAM) 50 MG tablet Take 50 mg by mouth every 6 (six) hours as needed for moderate pain.       Marland Kitchen triamcinolone cream (KENALOG) 0.1 % Apply 1 application topically daily as needed. For foot rash per patient      . zolpidem (AMBIEN) 10 MG tablet Take 10 mg by mouth at bedtime as needed for sleep.        No current facility-administered medications for this visit.    Allergies:    Allergies  Allergen Reactions  . Penicillins Rash  . Other     Blood products  . Zocor [Simvastatin] Other (See Comments)    Leg cramps    Social History:  The patient  reports that she quit smoking about 51 years ago. She does not have any smokeless tobacco history on file. She reports that she does not use illicit drugs.   ROS:  Please see the history of present illness.   Denies any bleeding, syncope, orthopnea, PND, no shortness of breath.    PHYSICAL EXAM: VS:  BP 110/58  Pulse 80  Ht 5\' 4"  (1.626 m)  Wt 206 lb (93.441 kg)  BMI 35.34 kg/m2 Well nourished, well developed, in no acute distress HEENT: normal Neck: no JVD Cardiac:  normal S1, S2; RRR; 2/6 systolic murmur Lungs:  clear to auscultation bilaterally, no wheezing, rhonchi or rales Abd: soft, nontender, no hepatomegaly Ext: no edemaCatheterization site appears normal Skin: warm and dryMinor bruising noted on right arm. Neuro: no focal abnormalities noted, speech difficulty  EKG:  None today.      ASSESSMENT AND PLAN:  1. Mild aortic stenosis-continue to monitor clinically. Exercise. Monitor blood pressure. 2. Hyperlipidemia-continue with atorvastatin. This is  being monitored by Dr. Stephanie Acre. 3. Hypertension-currently well controlled on angiotensin receptor blocker. 4. PVCs-seen on event monitor. Benign. 5. Syncope-no further occurrences. Cardiac workup reassuring. 6. History of chest pain-cardiac catheterization reassuring. If she needs to undergo hammertoe operation, she is fine to proceed. One year follow up. Signed, Elta Guadeloupe  Marlou Porch, MD [facility]  09/16/2013 3:14 PM

## 2013-09-16 NOTE — Patient Instructions (Signed)
The current medical regimen is effective;  continue present plan and medications.  Follow up in 1 year with Dr Skains.  You will receive a letter in the mail 2 months before you are due.  Please call us when you receive this letter to schedule your follow up appointment.  

## 2013-09-19 DIAGNOSIS — M545 Low back pain, unspecified: Secondary | ICD-10-CM | POA: Diagnosis not present

## 2013-09-19 DIAGNOSIS — Z6835 Body mass index (BMI) 35.0-35.9, adult: Secondary | ICD-10-CM | POA: Diagnosis not present

## 2013-09-19 DIAGNOSIS — IMO0002 Reserved for concepts with insufficient information to code with codable children: Secondary | ICD-10-CM | POA: Diagnosis not present

## 2013-09-19 DIAGNOSIS — M5137 Other intervertebral disc degeneration, lumbosacral region: Secondary | ICD-10-CM | POA: Diagnosis not present

## 2013-09-21 ENCOUNTER — Emergency Department (HOSPITAL_COMMUNITY): Payer: Medicare Other

## 2013-09-21 ENCOUNTER — Encounter (HOSPITAL_COMMUNITY): Payer: Self-pay | Admitting: Emergency Medicine

## 2013-09-21 ENCOUNTER — Emergency Department (HOSPITAL_COMMUNITY)
Admission: EM | Admit: 2013-09-21 | Discharge: 2013-09-21 | Disposition: A | Discharge status: Home / Self Care | Payer: Medicare Other | Attending: Emergency Medicine | Admitting: Emergency Medicine

## 2013-09-21 DIAGNOSIS — E785 Hyperlipidemia, unspecified: Secondary | ICD-10-CM | POA: Diagnosis not present

## 2013-09-21 DIAGNOSIS — Z791 Long term (current) use of non-steroidal anti-inflammatories (NSAID): Secondary | ICD-10-CM | POA: Insufficient documentation

## 2013-09-21 DIAGNOSIS — IMO0002 Reserved for concepts with insufficient information to code with codable children: Secondary | ICD-10-CM | POA: Diagnosis not present

## 2013-09-21 DIAGNOSIS — E119 Type 2 diabetes mellitus without complications: Secondary | ICD-10-CM | POA: Diagnosis not present

## 2013-09-21 DIAGNOSIS — M1990 Unspecified osteoarthritis, unspecified site: Secondary | ICD-10-CM | POA: Diagnosis not present

## 2013-09-21 DIAGNOSIS — I129 Hypertensive chronic kidney disease with stage 1 through stage 4 chronic kidney disease, or unspecified chronic kidney disease: Secondary | ICD-10-CM | POA: Insufficient documentation

## 2013-09-21 DIAGNOSIS — I10 Essential (primary) hypertension: Secondary | ICD-10-CM | POA: Diagnosis not present

## 2013-09-21 DIAGNOSIS — G8929 Other chronic pain: Secondary | ICD-10-CM | POA: Diagnosis not present

## 2013-09-21 DIAGNOSIS — E039 Hypothyroidism, unspecified: Secondary | ICD-10-CM | POA: Insufficient documentation

## 2013-09-21 DIAGNOSIS — M25569 Pain in unspecified knee: Secondary | ICD-10-CM | POA: Diagnosis not present

## 2013-09-21 DIAGNOSIS — M25561 Pain in right knee: Secondary | ICD-10-CM

## 2013-09-21 DIAGNOSIS — D573 Sickle-cell trait: Secondary | ICD-10-CM | POA: Diagnosis not present

## 2013-09-21 DIAGNOSIS — Z79899 Other long term (current) drug therapy: Secondary | ICD-10-CM | POA: Diagnosis not present

## 2013-09-21 DIAGNOSIS — Z87891 Personal history of nicotine dependence: Secondary | ICD-10-CM | POA: Insufficient documentation

## 2013-09-21 DIAGNOSIS — Z880 Allergy status to penicillin: Secondary | ICD-10-CM | POA: Diagnosis not present

## 2013-09-21 DIAGNOSIS — N183 Chronic kidney disease, stage 3 unspecified: Secondary | ICD-10-CM | POA: Diagnosis not present

## 2013-09-21 DIAGNOSIS — E7800 Pure hypercholesterolemia, unspecified: Secondary | ICD-10-CM | POA: Diagnosis not present

## 2013-09-21 DIAGNOSIS — M069 Rheumatoid arthritis, unspecified: Secondary | ICD-10-CM | POA: Insufficient documentation

## 2013-09-21 MED ORDER — HYDROCODONE-ACETAMINOPHEN 5-325 MG PO TABS: 1.0000 | ORAL_TABLET | Freq: Four times a day (QID) | ORAL | Status: DC | PRN

## 2013-09-21 MED ORDER — DEXAMETHASONE SODIUM PHOSPHATE 10 MG/ML IJ SOLN
10.0000 mg | Freq: Once | INTRAMUSCULAR | Status: AC
  Administered 2013-09-21: 10 mg via INTRAMUSCULAR
  Filled 2013-09-21: qty 1

## 2013-09-21 NOTE — ED Notes (Signed)
Pt. reports right knee pain with swelling onset this afternoon , denies injury or fall , pt. states history of rheumatoid arthritis . Denies fever or chills.

## 2013-09-21 NOTE — Discharge Instructions (Signed)
You received a steroid injection today. Make sure to keep a close eye on your blood sugar. Take norco as prescribed as needed for severe pain. Stay off of your leg as much as able.  Follow up with your orthopedics specialist.    Arthritis, Nonspecific Arthritis is inflammation of a joint. This usually means pain, redness, warmth or swelling are present. One or more joints may be involved. There are a number of types of arthritis. Your caregiver may not be able to tell what type of arthritis you have right away. CAUSES  The most common cause of arthritis is the wear and tear on the joint (osteoarthritis). This causes damage to the cartilage, which can break down over time. The knees, hips, back and neck are most often affected by this type of arthritis. Other types of arthritis and common causes of joint pain include:  Sprains and other injuries near the joint. Sometimes minor sprains and injuries cause pain and swelling that develop hours later.  Rheumatoid arthritis. This affects hands, feet and knees. It usually affects both sides of your body at the same time. It is often associated with chronic ailments, fever, weight loss and general weakness.  Crystal arthritis. Gout and pseudo gout can cause occasional acute severe pain, redness and swelling in the foot, ankle, or knee.  Infectious arthritis. Bacteria can get into a joint through a break in overlying skin. This can cause infection of the joint. Bacteria and viruses can also spread through the blood and affect your joints.  Drug, infectious and allergy reactions. Sometimes joints can become mildly painful and slightly swollen with these types of illnesses. SYMPTOMS   Pain is the main symptom.  Your joint or joints can also be red, swollen and warm or hot to the touch.  You may have a fever with certain types of arthritis, or even feel overall ill.  The joint with arthritis will hurt with movement. Stiffness is present with some types  of arthritis. DIAGNOSIS  Your caregiver will suspect arthritis based on your description of your symptoms and on your exam. Testing may be needed to find the type of arthritis:  Blood and sometimes urine tests.  X-ray tests and sometimes CT or MRI scans.  Removal of fluid from the joint (arthrocentesis) is done to check for bacteria, crystals or other causes. Your caregiver (or a specialist) will numb the area over the joint with a local anesthetic, and use a needle to remove joint fluid for examination. This procedure is only minimally uncomfortable.  Even with these tests, your caregiver may not be able to tell what kind of arthritis you have. Consultation with a specialist (rheumatologist) may be helpful. TREATMENT  Your caregiver will discuss with you treatment specific to your type of arthritis. If the specific type cannot be determined, then the following general recommendations may apply. Treatment of severe joint pain includes:  Rest.  Elevation.  Anti-inflammatory medication (for example, ibuprofen) may be prescribed. Avoiding activities that cause increased pain.  Only take over-the-counter or prescription medicines for pain and discomfort as recommended by your caregiver.  Cold packs over an inflamed joint may be used for 10 to 15 minutes every hour. Hot packs sometimes feel better, but do not use overnight. Do not use hot packs if you are diabetic without your caregiver's permission.  A cortisone shot into arthritic joints may help reduce pain and swelling.  Any acute arthritis that gets worse over the next 1 to 2 days needs to be  looked at to be sure there is no joint infection. Long-term arthritis treatment involves modifying activities and lifestyle to reduce joint stress jarring. This can include weight loss. Also, exercise is needed to nourish the joint cartilage and remove waste. This helps keep the muscles around the joint strong. HOME CARE INSTRUCTIONS   Do not take  aspirin to relieve pain if gout is suspected. This elevates uric acid levels.  Only take over-the-counter or prescription medicines for pain, discomfort or fever as directed by your caregiver.  Rest the joint as much as possible.  If your joint is swollen, keep it elevated.  Use crutches if the painful joint is in your leg.  Drinking plenty of fluids may help for certain types of arthritis.  Follow your caregiver's dietary instructions.  Try low-impact exercise such as:  Swimming.  Water aerobics.  Biking.  Walking.  Morning stiffness is often relieved by a warm shower.  Put your joints through regular range-of-motion. SEEK MEDICAL CARE IF:   You do not feel better in 24 hours or are getting worse.  You have side effects to medications, or are not getting better with treatment. SEEK IMMEDIATE MEDICAL CARE IF:   You have a fever.  You develop severe joint pain, swelling or redness.  Many joints are involved and become painful and swollen.  There is severe back pain and/or leg weakness.  You have loss of bowel or bladder control. Document Released: 01/31/2004 Document Revised: 03/17/2011 Document Reviewed: 02/16/2008 [facility] Patient Information 2015 [place], [place]. This information is not intended to replace advice given to you by your health care provider. Make sure you discuss any questions you have with your health care provider.

## 2013-09-21 NOTE — ED Provider Notes (Signed)
CSN: [id number]     Arrival date & time 09/21/13  1843 History   First MD Initiated Contact with Patient 09/21/13 2039    This chart was scribed for non-physician practitioner Jeannett Senior, PA-C, working with Tanna Furry, MD by Rosary Lively, ED scribe. This patient was seen in room TR06C/TR06C and the patient's care was started at 9:07 PM.    Chief Complaint  Patient presents with  . Knee Pain   The history is provided by the patient. No language interpreter was used.   HPI Comments:  [name] is a 73 y.o. female with a h/o rheumatoid arthritis who presents to the Emergency Department complaining of severe, shooting, right knee pain that radiates to the right hip and right foot, onset approximately 2:00PM. Pt reports that she is also experiencing swelling. Pt reports that she fell and injured her knee in 2013. Pt reports that she was seen by her orthopedic doctor, and her knee was x-rayed, but there were no findings. Pt reports that she also has a rheumatologist. Pt reports that she takes Hydrocodone PRN. Pt reports that she is a diabetic and currently takes insulin.  Past Medical History  Diagnosis Date  . Rheumatoid arthritis(714.0)   . Glaucoma   . HTN (hypertension)   . Hypothyroidism   . Diabetes mellitus   . Hypercholesteremia   . Chronic pain   . Allergic rhinitis   . Peripheral neuropathy   . Osteoarthritis   . Diverticulosis   . Hemorrhoids     internal  . PPD positive     6 months ago  . Anemia   . Sickle cell trait   . Meningioma 2008  . CKD (chronic kidney disease), stage III   . Aortic stenosis 12/22/2012    Mild 3/14-2.35 m/s peak velocity  . Hyperlipidemia 12/22/2012  . PVC (premature ventricular contraction) 12/22/2012   Past Surgical History  Procedure Laterality Date  . Joint replacement  2001    left TKR  . Fistula repair  03/2010    partial colectomy  . Intracranial surgery  11/2007    removal of meningioma  . Total thyroidectomy   1992  . Rotator cuff repair  1999    BIlateral  . Sigmoidectomy  2012  . Abdominal hysterectomy  1978    TAH,& BSO 1  . Tonsillectomy and adenoidectomy    . Wrist sx      right    Family History  Problem Relation Age of Onset  . Hypotension Mother   . CVA Mother   . Diabetes Mother   . Hypertension Father   . Kidney failure Father    History  Substance Use Topics  . Smoking status: Former Smoker    Quit date: 01/06/1962  . Smokeless tobacco: Not on file  . Alcohol Use: Not on file   OB History   Grav Para Term Preterm Abortions TAB SAB Ect Mult Living                 Review of Systems  Constitutional: Negative for fever and chills.  Musculoskeletal: Positive for arthralgias and myalgias.      Allergies  Penicillins; Other; and Zocor  Home Medications   Prior to Admission medications   Medication Sig Start Date End Date Taking? Authorizing Provider  allopurinol (ZYLOPRIM) 300 MG tablet Take 300 mg by mouth daily.    Historical Provider, MD  atorvastatin (LIPITOR) 40 MG tablet Take 40 mg by mouth daily.    Historical  Provider, MD  cholecalciferol (VITAMIN D) 1000 UNITS tablet Take 400 Units by mouth daily. 2 capsule    Historical Provider, MD  clobetasol cream (TEMOVATE) AB-123456789 % Apply 1 application topically daily as needed. For rash on leg 02/11/13   Historical Provider, MD  cyclobenzaprine (FLEXERIL) 10 MG tablet Take 10 mg by mouth 3 (three) times daily as needed for muscle spasms.     Historical Provider, MD  diclofenac sodium (VOLTAREN) 1 % GEL Apply 2 g topically 4 (four) times daily. Rub into affected area of foot 2 to 4 times daily 01/19/13   Bronson Ing, DPM  ferrous fumarate (HEMOCYTE - 106 MG FE) 325 (106 FE) MG TABS Take 1 tablet by mouth.    Historical Provider, MD  fluocinonide ointment (LIDEX) AB-123456789 % Apply 1 application topically 2 (two) times daily. 08/31/13   Bronson Ing, DPM  folic acid (FOLVITE) 1 MG tablet Take 1 mg by mouth daily.     Historical Provider, MD  HYDROcodone-acetaminophen (NORCO/VICODIN) 5-325 MG per tablet  08/17/13   Historical Provider, MD  ketoconazole (NIZORAL) 2 % cream Apply 1 application topically daily as needed for irritation. Use on the bottom of feet 03/02/13   Historical Provider, MD  levothyroxine (SYNTHROID, LEVOTHROID) 200 MCG tablet Take 200 mcg by mouth daily before breakfast.    Historical Provider, MD  losartan (COZAAR) 50 MG tablet Take 50 mg by mouth daily.    Historical Provider, MD  multivitamin [facility]) per tablet Take 1 tablet by mouth daily.    Historical Provider, MD  MYRBETRIQ 50 MG TB24 tablet Take 50 mg by mouth daily.  03/08/13   Historical Provider, MD  predniSONE (DELTASONE) 5 MG tablet Take 5 mg by mouth See admin instructions. Take 4 tablet every day for one week, then decrease every week after that. Patient states she only has three tablets left as of today (08-05-13)    Historical Provider, MD  pregabalin (LYRICA) 75 MG capsule Take 75 mg by mouth 2 (two) times daily.    Historical Provider, MD  silver sulfADIAZINE (SILVADENE) 1 % cream Apply 1 application topically daily. 04/13/13   Bronson Ing, DPM  traMADol (ULTRAM) 50 MG tablet Take 50 mg by mouth every 6 (six) hours as needed for moderate pain.  01/14/13   Historical Provider, MD  triamcinolone cream (KENALOG) 0.1 % Apply 1 application topically daily as needed. For foot rash per patient 02/03/13   Historical Provider, MD  zolpidem (AMBIEN) 10 MG tablet Take 10 mg by mouth at bedtime as needed for sleep.     Historical Provider, MD   BP 147/70  Pulse 110  Temp(Src) 98.4 F (36.9 C) (Oral)  Resp 18  SpO2 92% Physical Exam  Nursing note and vitals reviewed. Constitutional: She is oriented to person, place, and time. She appears well-developed and well-nourished.  HENT:  Head: Normocephalic and atraumatic.  Eyes: EOM are normal.  Neck: Normal range of motion. Neck supple.  Cardiovascular: Normal rate.    Pulmonary/Chest: Effort normal.  Musculoskeletal: Normal range of motion.  Oral appearing right knee. Bilateral lower extremity edema, 1+. Diffuse tenderness to palpation over medial, anterior, lateral knee joint. Pain with range of motion. Negative anterior posterior drawer signs. No laxity or pain with medial lateral stress. No obvious joint effusion, no erythema or warmth to the touch over the joints. DP pulses intact.  Neurological: She is alert and oriented to person, place, and time.  Skin: Skin is warm and dry.  Psychiatric: She has a normal mood and affect. Her behavior is normal.    ED Course  Procedures  DIAGNOSTIC STUDIES: Oxygen Saturation is 92% on RA, adequate by my interpretation.  COORDINATION OF CARE: 9:11 PM-Discussed treatment plan which includes xray  with pt at bedside and pt agreed to plan.  Labs Review Labs Reviewed - No data to display  Imaging Review Dg Knee Complete 4 Views Right  09/21/2013   CLINICAL DATA:  Anterior right knee pain for several hr  EXAM: RIGHT KNEE - COMPLETE 4+ VIEW  COMPARISON:  None.  FINDINGS: No fracture or dislocation. Severe tricompartmental degenerative change noted. No suprapatellar effusion. No radiopaque foreign body.  IMPRESSION: No acute abnormality.   Electronically Signed   By: Conchita Paris M.D.   On: 09/21/2013 20:57     EKG Interpretation None      MDM   Final diagnoses:  Knee pain, right    Patient with history of rheumatoid arthritis. Presents with a nontraumatic pain in the right knee. X-ray obtained and is negative. No signs of joint infection based on examination at this time. Patient is afebrile. She is nontoxic appearing. No numbness or weakness in extremities. Patient is ambulatory. She states she has had prior pain similar to this and was told it was arthritis. States Decadron shot has helped in the past. Pt is diabetic. She states she will supplement her insulin based on her blood sugar. Will d/c with norco  and follow up with her orthopedics specialist.   Filed Vitals:   09/21/13 1859 09/21/13 2122  BP: 147/70 152/53  Pulse: 110 71  Temp: 98.4 F (36.9 C) 97.7 F (36.5 C)  TempSrc: Oral Oral  Resp: 18 16  SpO2: 92% 100%    I personally performed the services described in this documentation, which was scribed in my presence. The recorded information has been reviewed and is accurate.    Renold Genta, PA-C 09/21/13 2358

## 2013-09-28 ENCOUNTER — Encounter: Payer: Self-pay | Admitting: Podiatrist

## 2013-09-28 ENCOUNTER — Ambulatory Visit (INDEPENDENT_AMBULATORY_CARE_PROVIDER_SITE_OTHER): Payer: Medicare Other | Admitting: Podiatrist

## 2013-09-28 VITALS — BP 144/76 | HR 91 | Temp 97.7°F | Resp 14 | Ht 64.0 in | Wt 201.0 lb

## 2013-09-28 DIAGNOSIS — I200 Unstable angina: Secondary | ICD-10-CM

## 2013-09-28 DIAGNOSIS — M2042 Other hammer toe(s) (acquired), left foot: Secondary | ICD-10-CM

## 2013-09-28 DIAGNOSIS — M2040 Other hammer toe(s) (acquired), unspecified foot: Secondary | ICD-10-CM

## 2013-09-28 MED ORDER — HYDROCODONE-ACETAMINOPHEN 5-325 MG PO TABS: 1.0000 | ORAL_TABLET | ORAL | Status: DC | PRN

## 2013-09-28 NOTE — Patient Instructions (Signed)
Hammer Toes Hammer toes is a condition in which one or more of your toes is permanently flexed. CAUSES  This happens when a muscle imbalance or abnormal bone length makes your small toes buckle. This causes the toe joint to contract and the strong cord-like bands that attach muscles to the bones (tendons) in your toes to shorten.  SIGNS AND SYMPTOMS  Common symptoms of flexible hammer toes include:   A buildup of skin cells (corns). Corns occur where boney bumps come in frequent contact with hard surfaces. For example, where your shoes press and rub.  Irritation.  Inflammation.  Pain.  Limited motion in your toes. DIAGNOSIS  Hammer toes are diagnosed through a physical exam of your toes. During the exam, your health care provider may try to reproduce your symptoms by manipulating your foot. Often, X-ray exams are done to determine the degree of deformity and to make sure that the cause is not a fracture.  TREATMENT  Hammer toes can be treated with corrective surgery. There are several types of surgical procedures that can treat hammer toes. The most common procedures include:  Arthroplasty--A portion of the joint is surgically removed and your toe is straightened. The gap fills in with fibrous tissue. This procedure helps treat pain and deformity and helps restore function.  Fusion--Cartilage between the two bones of the affected joint is taken out and the bones fuse together into one longer bone. This helps keep your toe stable and reduces pain but leaves your toe stiff, yet straight.  Implantation--A portion of your bone is removed and replaced with an implant to restore motion.  Flexor tendon transfers--This procedure repositions the tendons that curl the toes down (flexor tendons). This may be done to release the deforming force that causes your toe to buckle. Several of these procedures require fixing your toe with a pin that is visible at the tip of your toe. The pin keeps the toe  straight during healing. Your health care provider will remove the pin usually within 4-8 weeks after the procedure.  Document Released: 12/21/1999 Document Revised: 12/28/2012 Document Reviewed: 08/30/2012 [facility] Patient Information 2015 [place], [place]. This information is not intended to replace advice given to you by your health care provider. Make sure you discuss any questions you have with your health care provider. Pre-Operative Instructions  Congratulations, you have decided to take an important step to improving your quality of life.  You can be assured that the doctors of [facility] will be with you every step of the way.  1. Plan to be at the surgery center/hospital at least 1 (one) hour prior to your scheduled time unless otherwise directed by the surgical center/hospital staff.  You must have a responsible adult accompany you, remain during the surgery and drive you home.  Make sure you have directions to the surgical center/hospital and know how to get there on time. 2. For hospital based surgery you will need to obtain a history and physical form from your family physician within 1 month prior to the date of surgery- we will give you a form for you primary physician.  3. We make every effort to accommodate the date you request for surgery.  There are however, times where surgery dates or times have to be moved.  We will contact you as soon as possible if a change in schedule is required.   4. No Aspirin/Ibuprofen for one week before surgery.  If you are on aspirin, any non-steroidal anti-inflammatory medications (Mobic,  Aleve, Ibuprofen) you should stop taking it 7 days prior to your surgery.  You make take Tylenol  For pain prior to surgery.  5. Medications- If you are taking daily heart and blood pressure medications, seizure, reflux, allergy, asthma, anxiety, pain or diabetes medications, make sure the surgery center/hospital is aware before the day of surgery so they may  notify you which medications to take or avoid the day of surgery. 6. No food or drink after midnight the night before surgery unless directed otherwise by surgical center/hospital staff. 7. No alcoholic beverages 24 hours prior to surgery.  No smoking 24 hours prior to or 24 hours after surgery. 8. Wear loose pants or shorts- loose enough to fit over bandages, boots, and casts. 9. No slip on shoes, sneakers are best. 10. Bring your boot with you to the surgery center/hospital.  Also bring crutches or a walker if your physician has prescribed it for you.  If you do not have this equipment, it will be provided for you after surgery. 11. If you have not been contracted by the surgery center/hospital by the day before your surgery, call to confirm the date and time of your surgery. 12. Leave-time from work may vary depending on the type of surgery you have.  Appropriate arrangements should be made prior to surgery with your employer. 13. Prescriptions will be provided immediately following surgery by your doctor.  Have these filled as soon as possible after surgery and take the medication as directed. 14. Remove nail polish on the operative foot. 15. Wash the night before surgery.  The night before surgery wash the foot and leg well with the antibacterial soap provided and water paying special attention to beneath the toenails and in between the toes.  Rinse thoroughly with water and dry well with a towel.  Perform this wash unless told not to do so by your physician.  Enclosed: 1 Ice pack (please put in freezer the night before surgery)   1 Hibiclens skin cleaner   Pre-op Instructions  If you have any questions regarding the instructions, do not hesitate to call our office.  [place]: [place], [place] [address] [place]: [address], [place], [place] [address] [phone]  [place]: [address], [place] [address] [phone]  Dr. Kendell Bane DPM, Dr.  Ila Mcgill DPM Dr. Harriet Masson DPM, Dr. Lanelle Bal DPM, Dr. Trudie Buckler DPM

## 2013-09-28 NOTE — Progress Notes (Signed)
Subjective: Patient presents today for followup of left second toe ulcer. She states the ulcer has healed and she isn't haven't any problem out of the toe now. She would like to consider surgery to fix the toe so that this does not come back and become a problem. Objective: Neurovascular status is unchanged from her last visit with palpable pedal pulses and neurological sensation decreased.. Contracted hammertoe left second digit is present with healed ulceration on the dorsal aspect. At the proximal interphalangeal joint. Contracture of the second digit on the right foot is also present but no ulceration is noted.  Assessment: Contracture with hammertoe right second toe is healed ulcer left second toe dorsally proximal interphalangeal joint, dermatitis right foot hammertoe right second toe  Plan: Discussed surgery to straighten out the left second toe to prevent this from being a problem. Recommended a arthroplasty/fusion of the left second toe and possible arthroplasty/fusion of the right second toe with removable pin fixation to be kept in the toe for 4-6 weeks.. The consent form was discussed and all three pages were signed and the patient's questions were encouraged and answered to the best of my ability. Risks of the surgery were discussed including but not limited to continued pain, infection, swelling, elevated toe, decreased range of motion,  suture or implant reaction, bleeding, decreased function, etc. Preoperative instructions were also dispensed to the patient as well as a preoperative surgical pamphlet to go along with the instructions. Surgery will be scheduled at the patients convenience and patient will be seen at [facility] specialty surgery center on outpatient basis.The patient is instructed to call if any questions or concerns arise.

## 2013-09-29 NOTE — ED Provider Notes (Signed)
Medical screening examination/treatment/procedure(s) were performed by non-physician practitioner and as supervising physician I was immediately available for consultation/collaboration.   EKG Interpretation None        Trudee Chirino, MD 09/29/13 0701 

## 2013-10-03 DIAGNOSIS — L2089 Other atopic dermatitis: Secondary | ICD-10-CM | POA: Diagnosis not present

## 2013-10-03 DIAGNOSIS — L538 Other specified erythematous conditions: Secondary | ICD-10-CM | POA: Diagnosis not present

## 2013-10-03 DIAGNOSIS — J029 Acute pharyngitis, unspecified: Secondary | ICD-10-CM | POA: Diagnosis not present

## 2013-10-07 ENCOUNTER — Telehealth: Payer: Self-pay | Admitting: *Deleted

## 2013-10-07 NOTE — Telephone Encounter (Signed)
I called and informed the patient that surgery needs to be rescheduled.  She asked to when.  I told her Wednesday is available.  She stated, "I can't do it on Wednesday, my husband works on Wednesday so I had asked for a Monday.  I will see if I can find somebody to bring me and call you back."  I asked her to call me back on Monday.  She asked for my name and phone number.

## 2013-10-10 ENCOUNTER — Telehealth: Payer: Self-pay | Admitting: *Deleted

## 2013-10-10 NOTE — Telephone Encounter (Signed)
I called and informed the patient that Dr. Valentina Lucks cannot do surgery on Wednesday.  However, she can do it on Thursday.  She stated, "I was going to call and tell you Wednesday would be okay now this.  Go ahead and put me down for Thursday, I'll work something out.  What time will it be, arrive at 6am?"  I told her I was unsure of the time the surgical center normally calls a day or two ahead of time to let you know what time to be there.  She stated okay thanks.   I called and informed the patient that we rescheduled her to Thursday.  Tentatively you need to arrive at 11:30am, nothing to eat or drink prior to the surgery.  She stated, "Okay, thank you."

## 2013-10-13 DIAGNOSIS — M2041 Other hammer toe(s) (acquired), right foot: Secondary | ICD-10-CM | POA: Diagnosis not present

## 2013-10-13 DIAGNOSIS — M2042 Other hammer toe(s) (acquired), left foot: Secondary | ICD-10-CM | POA: Diagnosis not present

## 2013-10-13 DIAGNOSIS — I10 Essential (primary) hypertension: Secondary | ICD-10-CM | POA: Diagnosis not present

## 2013-10-13 DIAGNOSIS — M2020 Hallux rigidus, unspecified foot: Secondary | ICD-10-CM | POA: Diagnosis not present

## 2013-10-14 ENCOUNTER — Encounter: Payer: Medicare Other | Admitting: Podiatrist

## 2013-10-21 ENCOUNTER — Ambulatory Visit (INDEPENDENT_AMBULATORY_CARE_PROVIDER_SITE_OTHER): Payer: Medicare Other | Admitting: Podiatrist

## 2013-10-21 ENCOUNTER — Encounter: Payer: Self-pay | Admitting: Podiatrist

## 2013-10-21 ENCOUNTER — Ambulatory Visit (INDEPENDENT_AMBULATORY_CARE_PROVIDER_SITE_OTHER): Payer: Medicare Other

## 2013-10-21 VITALS — BP 182/76 | HR 93 | Resp 15

## 2013-10-21 DIAGNOSIS — Z9889 Other specified postprocedural states: Secondary | ICD-10-CM

## 2013-10-21 DIAGNOSIS — M2040 Other hammer toe(s) (acquired), unspecified foot: Secondary | ICD-10-CM | POA: Diagnosis not present

## 2013-10-21 MED ORDER — HYDROCODONE-IBUPROFEN 7.5-200 MG PO TABS: 1.0000 | ORAL_TABLET | Freq: Four times a day (QID) | ORAL | Status: DC | PRN

## 2013-10-21 NOTE — Progress Notes (Signed)
   Subjective:    Patient ID: [name], female    DOB: [birth year], 73 y.o.   MRN: [id number]  HPI Comments: DOS 10/13/2013 B/L 2nd hammer toe repair.  Pt states she is doing fine with the surgery, but feels her B/P may be up due to the pain in her right knee.     Review of Systems     Objective:   Physical Exam Excellent apperance postoperatively is noted to bilateral second toes. Normal amount of swelling is present to the toes themselves. Pin fixation is good alignment position clinically and radiographically. No redness, no streaking, no lymphangitis, no malodor, no sign of infection is present. Excellent appearance to the incision site with sutures in place.    Assessment & Plan:  Status post digital fusion with pin fixation second toe bilateral  Plan: Redressed the foot and a dry, sterile and compressive dressing. She'll be seen back in one week for sutures will be removed and a new dressing applied. I will see her back in 4 weeks postoperative for consideration of removal of pins. In the meantime she will keep the feet dry and continued to wear her [employer]co shoes. If any concerns arise prior that visit she will call.

## 2013-10-21 NOTE — Patient Instructions (Signed)
Keep your dressing on and keep your foot dry for another week-- we will remove the sutures next week and will take out the pins usually at 4-6 weeks.  You are doing great!

## 2013-10-27 ENCOUNTER — Encounter: Payer: Self-pay | Admitting: Podiatrist

## 2013-10-27 ENCOUNTER — Ambulatory Visit (INDEPENDENT_AMBULATORY_CARE_PROVIDER_SITE_OTHER): Payer: Medicare Other | Admitting: Podiatrist

## 2013-10-27 VITALS — BP 180/79 | HR 96 | Resp 15

## 2013-10-27 DIAGNOSIS — M2040 Other hammer toe(s) (acquired), unspecified foot: Secondary | ICD-10-CM

## 2013-10-27 DIAGNOSIS — Z9889 Other specified postprocedural states: Secondary | ICD-10-CM

## 2013-10-28 NOTE — Progress Notes (Signed)
Subjective:  Patient presents for 2 week follow up status post arthroplasty with screw fixation she states she is doing well.  Her feet are peeling and are extremely dry.   Physical Exam  Severe dryness and flakiness is present to bilateral feet. Incision sites are well healed. No sign of infection is present.  Bilateral second toes are rectus. Normal amount of swelling is present to the toes themselves. Pin fixation is good alignment position clinically. No redness, no streaking, no lymphangitis, no malodor, no sign of infection is present. Excellent appearance to the incision site with sutures in place.  Assessment & Plan:   Status post digital fusion with pin fixation second toe bilateral  Plan:  Sutures removed.  She is given instructions  For soaking and deep cleansing of the feet-- she will apply antibiotic ointment and a bandage to the toes to prevent the kwires from backing out.  She will be seen back in 2 ee

## 2013-11-02 ENCOUNTER — Encounter: Payer: Self-pay | Admitting: Podiatrist

## 2013-11-02 ENCOUNTER — Ambulatory Visit (INDEPENDENT_AMBULATORY_CARE_PROVIDER_SITE_OTHER): Payer: Medicare Other

## 2013-11-02 ENCOUNTER — Ambulatory Visit (INDEPENDENT_AMBULATORY_CARE_PROVIDER_SITE_OTHER): Payer: Medicare Other | Admitting: Podiatrist

## 2013-11-02 VITALS — BP 138/86 | HR 86 | Resp 12

## 2013-11-02 DIAGNOSIS — M2040 Other hammer toe(s) (acquired), unspecified foot: Secondary | ICD-10-CM

## 2013-11-02 DIAGNOSIS — G894 Chronic pain syndrome: Secondary | ICD-10-CM | POA: Diagnosis not present

## 2013-11-02 DIAGNOSIS — M797 Fibromyalgia: Secondary | ICD-10-CM | POA: Diagnosis not present

## 2013-11-02 DIAGNOSIS — M069 Rheumatoid arthritis, unspecified: Secondary | ICD-10-CM | POA: Diagnosis not present

## 2013-11-02 NOTE — Patient Instructions (Signed)
Use the Lidex cream (Floucocinide Cream) on your feet and alternate it with the silver cream.  One in the morning and the other in the Pm

## 2013-11-04 NOTE — Progress Notes (Signed)
Subjective: Patient presents today for her postop follow-up. She states that her pin lost its pin And she was concerned for the right second toe. Otherwise she denies any local or systemic signs of infection. States she is having some discomfort in the toes due to the pins.  Objective: Neurovascular status is unchanged. Excellent appearance of the digit and second digits are noted. Pin fixation is in good alignment and position. X-ray show bony approximation of the proximal interphalangeal joint status post hammertoe repair with pin fixation.  Assessment: Status post hammertoe surgery again digit bilateral  Plan: Remove the pins today without complication. Redressed and a dry sterile and compressive dressing. Given instructions for after care and use of Coban wrap. I will see her back in 3 weeks for recheck if any problems arise prior to that visit she will call. She may also wean out of her surgery shoe in 2 weeks.

## 2013-11-11 ENCOUNTER — Encounter: Payer: Medicare Other | Admitting: Podiatrist

## 2013-11-14 DIAGNOSIS — M170 Bilateral primary osteoarthritis of knee: Secondary | ICD-10-CM | POA: Diagnosis not present

## 2013-11-14 DIAGNOSIS — M7062 Trochanteric bursitis, left hip: Secondary | ICD-10-CM | POA: Diagnosis not present

## 2013-11-14 DIAGNOSIS — M1A00X0 Idiopathic chronic gout, unspecified site, without tophus (tophi): Secondary | ICD-10-CM | POA: Diagnosis not present

## 2013-11-14 DIAGNOSIS — M19041 Primary osteoarthritis, right hand: Secondary | ICD-10-CM | POA: Diagnosis not present

## 2013-11-15 DIAGNOSIS — H40023 Open angle with borderline findings, high risk, bilateral: Secondary | ICD-10-CM | POA: Diagnosis not present

## 2013-11-18 ENCOUNTER — Encounter: Payer: Self-pay | Admitting: Podiatrist

## 2013-11-18 ENCOUNTER — Encounter: Payer: Medicare Other | Admitting: Podiatrist

## 2013-11-18 ENCOUNTER — Ambulatory Visit (INDEPENDENT_AMBULATORY_CARE_PROVIDER_SITE_OTHER): Payer: Medicare Other | Admitting: Podiatrist

## 2013-11-18 VITALS — BP 162/70 | HR 68 | Resp 16

## 2013-11-18 DIAGNOSIS — Z9889 Other specified postprocedural states: Secondary | ICD-10-CM

## 2013-11-18 DIAGNOSIS — M2040 Other hammer toe(s) (acquired), unspecified foot: Secondary | ICD-10-CM

## 2013-11-20 NOTE — Progress Notes (Signed)
Subjective: Patient presents today for her postop follow-up. She continues to have swelling in bilateral 2nd toes but is able to wear her normal shoes without trouble.  Otherwise she denies any local or systemic signs of infection.   Objective: Neurovascular status is unchanged. Excellent aligment of the digit and second digits are noted. Significant swelling is present on the second toe left greater than right.    Assessment: Status post hammertoe surgery again digit bilateral  Plan: recommended use of coban wrap for the 2nd toes bilaterally.  She was shown how to appropriately apply the wrappings.  She may continue to wear her normal shoes.  She will be seen back in 4 weeks for recheck.

## 2013-12-09 DIAGNOSIS — K1120 Sialoadenitis, unspecified: Secondary | ICD-10-CM | POA: Diagnosis not present

## 2013-12-12 DIAGNOSIS — M1711 Unilateral primary osteoarthritis, right knee: Secondary | ICD-10-CM | POA: Diagnosis not present

## 2013-12-15 ENCOUNTER — Encounter (HOSPITAL_COMMUNITY): Payer: Self-pay | Admitting: Cardiology

## 2013-12-16 ENCOUNTER — Ambulatory Visit (INDEPENDENT_AMBULATORY_CARE_PROVIDER_SITE_OTHER): Payer: Medicare Other | Admitting: Podiatrist

## 2013-12-16 ENCOUNTER — Encounter: Payer: Self-pay | Admitting: Podiatrist

## 2013-12-16 VITALS — BP 151/84 | HR 98 | Resp 16

## 2013-12-16 DIAGNOSIS — M2040 Other hammer toe(s) (acquired), unspecified foot: Secondary | ICD-10-CM

## 2013-12-16 DIAGNOSIS — Z9889 Other specified postprocedural states: Secondary | ICD-10-CM

## 2013-12-19 DIAGNOSIS — M5416 Radiculopathy, lumbar region: Secondary | ICD-10-CM | POA: Diagnosis not present

## 2013-12-19 DIAGNOSIS — M545 Low back pain: Secondary | ICD-10-CM | POA: Diagnosis not present

## 2013-12-19 DIAGNOSIS — Z6835 Body mass index (BMI) 35.0-35.9, adult: Secondary | ICD-10-CM | POA: Diagnosis not present

## 2013-12-19 DIAGNOSIS — M5126 Other intervertebral disc displacement, lumbar region: Secondary | ICD-10-CM | POA: Diagnosis not present

## 2013-12-19 DIAGNOSIS — I10 Essential (primary) hypertension: Secondary | ICD-10-CM | POA: Diagnosis not present

## 2013-12-20 ENCOUNTER — Ambulatory Visit (INDEPENDENT_AMBULATORY_CARE_PROVIDER_SITE_OTHER): Payer: Medicare Other | Admitting: Cardiology

## 2013-12-20 ENCOUNTER — Encounter: Payer: Self-pay | Admitting: Cardiology

## 2013-12-20 VITALS — BP 130/76 | HR 82 | Ht 64.0 in | Wt 204.0 lb

## 2013-12-20 DIAGNOSIS — I10 Essential (primary) hypertension: Secondary | ICD-10-CM

## 2013-12-20 DIAGNOSIS — I200 Unstable angina: Secondary | ICD-10-CM

## 2013-12-20 DIAGNOSIS — I350 Nonrheumatic aortic (valve) stenosis: Secondary | ICD-10-CM

## 2013-12-20 NOTE — Patient Instructions (Signed)
The current medical regimen is effective;  continue present plan and medications.  Follow up in 1 year with Dr Skains.  You will receive a letter in the mail 2 months before you are due.  Please call us when you receive this letter to schedule your follow up appointment.  

## 2013-12-20 NOTE — Progress Notes (Signed)
[facility]. [address], [place], [place]  [address] Phone: [phone] Fax:  [phone]  Date:  12/20/2013   ID:  [name], DOB Mar 25, 1940, MRN [id number]  PCP:  Julia Coma, MD   History of Present Illness: [name] is a 73 y.o. female with history of carotid artery disease, with bilateral ICA stenosis of up to 40%, mild aortic insufficiency/stenosis, diastolic dysfunction and type 2 diabetes. Had heart catheterization on 08/08/13 which was normal. EF 70%, mild aortic stenosis on echocardiogram EKG - nonspecific ST-T wave changes. Patient has fairly good functional status. Previously she had an episode of syncope where she was standing in the kitchen/working felt dizzy fell then returned to consciousness as she hit the floor. currently she describes no significant warning. She woke up and was in her usual state. She did need some help getting up off of the floor. No diaphoresis. She is a Restaurant manager, fast food. Hemoglobin 12.0, creatinine 1.36.   Cardiac catheterization 08/08/13-normal coronary arteries  Event monitor 2/14 showed no adverse arrhythmias.PVC's  Nuclear stress test: 03/05/12-low risk, no ischemia, normal EF  Echocardiogram: 08/08/13 - EF 70%, mild aortic stenosis-    Wt Readings from Last 3 Encounters:  12/20/13 204 lb (92.534 kg)  09/28/13 201 lb (91.173 kg)  09/16/13 206 lb (93.441 kg)     Past Medical History  Diagnosis Date  . Rheumatoid arthritis(714.0)   . Glaucoma   . HTN (hypertension)   . Hypothyroidism   . Diabetes mellitus   . Hypercholesteremia   . Chronic pain   . Allergic rhinitis   . Peripheral neuropathy   . Osteoarthritis   . Diverticulosis   . Hemorrhoids     internal  . PPD positive     6 months ago  . Anemia   . Sickle cell trait   . Meningioma 2008  . CKD (chronic kidney disease), stage III   . Aortic stenosis 12/22/2012    Mild 3/14-2.35 m/s peak velocity  . Hyperlipidemia 12/22/2012  . PVC  (premature ventricular contraction) 12/22/2012    Past Surgical History  Procedure Laterality Date  . Joint replacement  2001    left TKR  . Fistula repair  03/2010    partial colectomy  . Intracranial surgery  11/2007    removal of meningioma  . Total thyroidectomy  1992  . Rotator cuff repair  1999    BIlateral  . Sigmoidectomy  2012  . Abdominal hysterectomy  1978    TAH,& BSO 1  . Tonsillectomy and adenoidectomy    . Wrist sx      right   . Left heart catheterization with coronary angiogram N/A 08/08/2013    Procedure: LEFT HEART CATHETERIZATION WITH CORONARY ANGIOGRAM;  Surgeon: Peter M Martinique, MD;  Location: [facility] CATH LAB;  Service: Cardiovascular;  Laterality: N/A;    Current Outpatient Prescriptions  Medication Sig Dispense Refill  . allopurinol (ZYLOPRIM) 300 MG tablet Take 300 mg by mouth daily.    Marland Kitchen atorvastatin (LIPITOR) 40 MG tablet Take 40 mg by mouth daily.    . cholecalciferol (VITAMIN D) 1000 UNITS tablet Take 400 Units by mouth daily. 2 capsule    . clindamycin (CLEOCIN) 300 MG capsule     . clobetasol cream (TEMOVATE) AB-123456789 % Apply 1 application topically daily as needed. For rash on leg    . cyclobenzaprine (FLEXERIL) 10 MG tablet Take 10 mg by mouth 3 (three) times daily as needed for muscle spasms.     Marland Kitchen  diclofenac sodium (VOLTAREN) 1 % GEL Apply 2 g topically 4 (four) times daily. Rub into affected area of foot 2 to 4 times daily    . ferrous fumarate (HEMOCYTE - 106 MG FE) 325 (106 FE) MG TABS Take 1 tablet by mouth.    . fluocinonide ointment (LIDEX) AB-123456789 % Apply 1 application topically 2 (two) times daily. 60 g 2  . folic acid (FOLVITE) 1 MG tablet Take 1 mg by mouth daily.    Marland Kitchen gabapentin (NEURONTIN) 100 MG capsule Take 100 mg by mouth 3 (three) times daily.    Marland Kitchen HYDROcodone-ibuprofen (VICOPROFEN) 7.5-200 MG per tablet Take 1 tablet by mouth every 6 (six) hours as needed for moderate pain. 40 tablet 0  . ketoconazole (NIZORAL) 2 % cream Apply 1 application  topically daily as needed for irritation. Use on the bottom of feet    . levothyroxine (SYNTHROID, LEVOTHROID) 200 MCG tablet Take 200 mcg by mouth daily before breakfast.    . losartan (COZAAR) 50 MG tablet Take 50 mg by mouth daily.    . multivitamin (THERAGRAN) per tablet Take 1 tablet by mouth daily.    Marland Kitchen MYRBETRIQ 50 MG TB24 tablet Take 50 mg by mouth daily.     . predniSONE (DELTASONE) 5 MG tablet Take 5 mg by mouth See admin instructions. Take 4 tablet every day for one week, then decrease every week after that. Patient states she only has three tablets left as of today (08-05-13)    . pregabalin (LYRICA) 75 MG capsule Take 75 mg by mouth 2 (two) times daily.    . silver sulfADIAZINE (SILVADENE) 1 % cream Apply 1 application topically daily.    . traMADol (ULTRAM) 50 MG tablet Take 50 mg by mouth every 6 (six) hours as needed for moderate pain.     Marland Kitchen triamcinolone cream (KENALOG) 0.1 % Apply 1 application topically daily as needed. For foot rash per patient    . zolpidem (AMBIEN) 10 MG tablet Take 10 mg by mouth at bedtime as needed for sleep.      No current facility-administered medications for this visit.    Allergies:    Allergies  Allergen Reactions  . Penicillins Rash  . Other     Blood products  . Zocor [Simvastatin] Other (See Comments)    Leg cramps    Social History:  The patient  reports that she quit smoking about 51 years ago. She does not have any smokeless tobacco history on file. She reports that she does not use illicit drugs.   ROS:  Please see the history of present illness.  Rash on hands.  Denies any bleeding, syncope, orthopnea, PND, no shortness of breath.    PHYSICAL EXAM: VS:  BP 130/76 mmHg  Pulse 82  Ht 5\' 4"  (1.626 m)  Wt 204 lb (92.534 kg)  BMI 35.00 kg/m2 Well nourished, well developed, in no acute distress HEENT: normal Neck: no JVD Cardiac:  normal S1, S2; RRR; 2/6 systolic murmur Lungs:  clear to auscultation bilaterally, no wheezing,  rhonchi or rales Abd: soft, nontender, no hepatomegaly Ext: no edemaCatheterization site appears normal Skin: warm and dryRash on palms noted Neuro: no focal abnormalities noted, speech difficulty  EKG:  None today.      ASSESSMENT AND PLAN:  1. Mild aortic stenosis-continue to monitor clinically. Exercise. Monitor blood pressure. 2. Hyperlipidemia-continue with atorvastatin. This is being monitored by Dr. Stephanie Acre. 3. Hypertension-currently well controlled on angiotensin receptor blocker. 4. PVCs-seen on event monitor.  Benign. 5. Syncope-no further occurrences. Cardiac workup reassuring. 6. History of chest pain-cardiac catheterization reassuring. Hammertoe operation, success.    One year follow up. Signed, Candee Furbish, MD [facility]  12/20/2013 4:09 PM

## 2013-12-23 DIAGNOSIS — M5416 Radiculopathy, lumbar region: Secondary | ICD-10-CM | POA: Diagnosis not present

## 2013-12-23 DIAGNOSIS — M5116 Intervertebral disc disorders with radiculopathy, lumbar region: Secondary | ICD-10-CM | POA: Diagnosis not present

## 2013-12-23 DIAGNOSIS — M545 Low back pain: Secondary | ICD-10-CM | POA: Diagnosis not present

## 2013-12-23 DIAGNOSIS — M47816 Spondylosis without myelopathy or radiculopathy, lumbar region: Secondary | ICD-10-CM | POA: Diagnosis not present

## 2013-12-26 NOTE — Progress Notes (Signed)
Subjective: Patient presents today for her fourth postop follow-up. She continues to have swelling in bilateral 2nd toes but is able to wear her normal shoes without trouble.  Otherwise she denies any local or systemic signs of infection.   Objective: Neurovascular status is unchanged. Excellent aligment of the digit and second digits are noted. Significant swelling is present on the second toe left greater than right.    Assessment: Status post hammertoe surgery again digit bilateral  Plan: recommended use of coban wrap for the 2nd toes bilaterally.  She was shown how to appropriately apply the wrappings.  She may continue to wear her normal shoes.  She will be seen back in 4 weeks for recheck.

## 2013-12-27 DIAGNOSIS — L309 Dermatitis, unspecified: Secondary | ICD-10-CM | POA: Diagnosis not present

## 2014-01-02 DIAGNOSIS — Z1231 Encounter for screening mammogram for malignant neoplasm of breast: Secondary | ICD-10-CM | POA: Diagnosis not present

## 2014-01-13 ENCOUNTER — Encounter: Payer: Medicare Other | Admitting: Podiatrist

## 2014-01-13 DIAGNOSIS — E039 Hypothyroidism, unspecified: Secondary | ICD-10-CM | POA: Diagnosis not present

## 2014-01-13 DIAGNOSIS — E1142 Type 2 diabetes mellitus with diabetic polyneuropathy: Secondary | ICD-10-CM | POA: Diagnosis not present

## 2014-01-16 DIAGNOSIS — H2513 Age-related nuclear cataract, bilateral: Secondary | ICD-10-CM | POA: Diagnosis not present

## 2014-01-16 DIAGNOSIS — H40023 Open angle with borderline findings, high risk, bilateral: Secondary | ICD-10-CM | POA: Diagnosis not present

## 2014-01-20 DIAGNOSIS — M5416 Radiculopathy, lumbar region: Secondary | ICD-10-CM | POA: Diagnosis not present

## 2014-01-20 DIAGNOSIS — M5126 Other intervertebral disc displacement, lumbar region: Secondary | ICD-10-CM | POA: Diagnosis not present

## 2014-01-20 DIAGNOSIS — M545 Low back pain: Secondary | ICD-10-CM | POA: Diagnosis not present

## 2014-01-20 DIAGNOSIS — Z6834 Body mass index (BMI) 34.0-34.9, adult: Secondary | ICD-10-CM | POA: Diagnosis not present

## 2014-01-25 DIAGNOSIS — M1711 Unilateral primary osteoarthritis, right knee: Secondary | ICD-10-CM | POA: Diagnosis not present

## 2014-01-26 DIAGNOSIS — J209 Acute bronchitis, unspecified: Secondary | ICD-10-CM | POA: Diagnosis not present

## 2014-01-26 DIAGNOSIS — J029 Acute pharyngitis, unspecified: Secondary | ICD-10-CM | POA: Diagnosis not present

## 2014-01-26 DIAGNOSIS — H6120 Impacted cerumen, unspecified ear: Secondary | ICD-10-CM | POA: Diagnosis not present

## 2014-01-26 DIAGNOSIS — M549 Dorsalgia, unspecified: Secondary | ICD-10-CM | POA: Diagnosis not present

## 2014-01-26 DIAGNOSIS — M2660 Temporomandibular joint disorder, unspecified: Secondary | ICD-10-CM | POA: Diagnosis not present

## 2014-01-27 ENCOUNTER — Encounter: Payer: Medicare Other | Admitting: Podiatrist

## 2014-02-01 ENCOUNTER — Encounter: Payer: Self-pay | Admitting: Podiatrist

## 2014-02-01 ENCOUNTER — Ambulatory Visit (INDEPENDENT_AMBULATORY_CARE_PROVIDER_SITE_OTHER): Payer: Medicare Other

## 2014-02-01 ENCOUNTER — Ambulatory Visit (INDEPENDENT_AMBULATORY_CARE_PROVIDER_SITE_OTHER): Payer: Medicare Other | Admitting: Podiatrist

## 2014-02-01 VITALS — BP 129/68 | HR 88 | Resp 12

## 2014-02-01 DIAGNOSIS — M2040 Other hammer toe(s) (acquired), unspecified foot: Secondary | ICD-10-CM

## 2014-02-01 DIAGNOSIS — L84 Corns and callosities: Secondary | ICD-10-CM

## 2014-02-01 DIAGNOSIS — M1711 Unilateral primary osteoarthritis, right knee: Secondary | ICD-10-CM | POA: Diagnosis not present

## 2014-02-01 DIAGNOSIS — Q828 Other specified congenital malformations of skin: Secondary | ICD-10-CM

## 2014-02-01 DIAGNOSIS — E1140 Type 2 diabetes mellitus with diabetic neuropathy, unspecified: Secondary | ICD-10-CM

## 2014-02-04 NOTE — Progress Notes (Signed)
Subjective: Patient presents today for postop follow-up. She continues to have swelling in bilateral 2nd toes but is able to wear her normal shoes without trouble.  Otherwise she denies any local or systemic signs of infection.   Objective: Neurovascular status is unchanged. Slight contracted aligment of the digit and second digits are noted. swelling is present on the second toe left greater than right- is improve from previous visit.  No sign of rubbing of the toes on the shoes.  Toes are aligned with the remainder toes however they are not completely straight.  xrays show incomplete consolidation of the second digits    Assessment: Status post hammertoe surgery again digit bilateral  Plan: recommended use of coban wrap for the 2nd toes bilaterally.  She was shown how to appropriately apply the wrappings.  She may continue to wear her normal shoes. Discussed that if she wishes to have completely straight toes, we can go back and insert digital screws.  She relates the curvature is not bothersome, just the swelling.  This should continue to improve.   She will be seen back in 4 weeks for recheck.

## 2014-02-08 ENCOUNTER — Other Ambulatory Visit (HOSPITAL_COMMUNITY): Payer: Self-pay | Admitting: Cardiology

## 2014-02-08 ENCOUNTER — Ambulatory Visit (HOSPITAL_COMMUNITY): Payer: Medicare Other | Attending: Internal Medicine | Admitting: Cardiology

## 2014-02-08 DIAGNOSIS — M1711 Unilateral primary osteoarthritis, right knee: Secondary | ICD-10-CM | POA: Diagnosis not present

## 2014-02-08 DIAGNOSIS — M79604 Pain in right leg: Secondary | ICD-10-CM

## 2014-02-08 DIAGNOSIS — M7989 Other specified soft tissue disorders: Secondary | ICD-10-CM | POA: Insufficient documentation

## 2014-02-08 NOTE — Progress Notes (Signed)
Right lower venous duplex performed   Results given to Matthew Saras, PA

## 2014-03-01 ENCOUNTER — Emergency Department (HOSPITAL_COMMUNITY): Payer: Medicare Other

## 2014-03-01 ENCOUNTER — Encounter (HOSPITAL_COMMUNITY): Payer: Self-pay | Admitting: Emergency Medicine

## 2014-03-01 ENCOUNTER — Emergency Department (HOSPITAL_COMMUNITY)
Admission: EM | Admit: 2014-03-01 | Discharge: 2014-03-01 | Disposition: A | Discharge status: Home / Self Care | Payer: Medicare Other | Attending: Emergency Medicine | Admitting: Emergency Medicine

## 2014-03-01 DIAGNOSIS — H409 Unspecified glaucoma: Secondary | ICD-10-CM | POA: Diagnosis not present

## 2014-03-01 DIAGNOSIS — S24109A Unspecified injury at unspecified level of thoracic spinal cord, initial encounter: Secondary | ICD-10-CM | POA: Diagnosis not present

## 2014-03-01 DIAGNOSIS — I129 Hypertensive chronic kidney disease with stage 1 through stage 4 chronic kidney disease, or unspecified chronic kidney disease: Secondary | ICD-10-CM | POA: Diagnosis not present

## 2014-03-01 DIAGNOSIS — Z792 Long term (current) use of antibiotics: Secondary | ICD-10-CM | POA: Insufficient documentation

## 2014-03-01 DIAGNOSIS — E780 Pure hypercholesterolemia: Secondary | ICD-10-CM | POA: Insufficient documentation

## 2014-03-01 DIAGNOSIS — E785 Hyperlipidemia, unspecified: Secondary | ICD-10-CM | POA: Insufficient documentation

## 2014-03-01 DIAGNOSIS — G8929 Other chronic pain: Secondary | ICD-10-CM | POA: Insufficient documentation

## 2014-03-01 DIAGNOSIS — Z79899 Other long term (current) drug therapy: Secondary | ICD-10-CM | POA: Diagnosis not present

## 2014-03-01 DIAGNOSIS — D649 Anemia, unspecified: Secondary | ICD-10-CM | POA: Diagnosis not present

## 2014-03-01 DIAGNOSIS — S161XXA Strain of muscle, fascia and tendon at neck level, initial encounter: Secondary | ICD-10-CM

## 2014-03-01 DIAGNOSIS — S3992XA Unspecified injury of lower back, initial encounter: Secondary | ICD-10-CM | POA: Diagnosis not present

## 2014-03-01 DIAGNOSIS — E119 Type 2 diabetes mellitus without complications: Secondary | ICD-10-CM | POA: Insufficient documentation

## 2014-03-01 DIAGNOSIS — N183 Chronic kidney disease, stage 3 (moderate): Secondary | ICD-10-CM | POA: Diagnosis not present

## 2014-03-01 DIAGNOSIS — Z8719 Personal history of other diseases of the digestive system: Secondary | ICD-10-CM | POA: Diagnosis not present

## 2014-03-01 DIAGNOSIS — Z8739 Personal history of other diseases of the musculoskeletal system and connective tissue: Secondary | ICD-10-CM | POA: Insufficient documentation

## 2014-03-01 DIAGNOSIS — M545 Low back pain: Secondary | ICD-10-CM | POA: Diagnosis not present

## 2014-03-01 DIAGNOSIS — Y92410 Unspecified street and highway as the place of occurrence of the external cause: Secondary | ICD-10-CM | POA: Diagnosis not present

## 2014-03-01 DIAGNOSIS — Y9389 Activity, other specified: Secondary | ICD-10-CM | POA: Insufficient documentation

## 2014-03-01 DIAGNOSIS — E039 Hypothyroidism, unspecified: Secondary | ICD-10-CM | POA: Diagnosis not present

## 2014-03-01 DIAGNOSIS — Z87891 Personal history of nicotine dependence: Secondary | ICD-10-CM | POA: Diagnosis not present

## 2014-03-01 DIAGNOSIS — Z880 Allergy status to penicillin: Secondary | ICD-10-CM | POA: Diagnosis not present

## 2014-03-01 DIAGNOSIS — G629 Polyneuropathy, unspecified: Secondary | ICD-10-CM | POA: Insufficient documentation

## 2014-03-01 DIAGNOSIS — Y998 Other external cause status: Secondary | ICD-10-CM | POA: Insufficient documentation

## 2014-03-01 DIAGNOSIS — S199XXA Unspecified injury of neck, initial encounter: Secondary | ICD-10-CM | POA: Diagnosis present

## 2014-03-01 MED ORDER — HYDROCODONE-ACETAMINOPHEN 5-325 MG PO TABS: 2.0000 | ORAL_TABLET | ORAL | Status: DC | PRN
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
## 2014-03-01 NOTE — ED Notes (Signed)
Pt made aware to return if symptoms worsen or if any life threatening symptoms occur.   

## 2014-03-06 DIAGNOSIS — M25561 Pain in right knee: Secondary | ICD-10-CM | POA: Diagnosis not present

## 2014-03-06 DIAGNOSIS — M069 Rheumatoid arthritis, unspecified: Secondary | ICD-10-CM | POA: Diagnosis not present

## 2014-03-06 DIAGNOSIS — M25511 Pain in right shoulder: Secondary | ICD-10-CM | POA: Diagnosis not present

## 2014-03-06 DIAGNOSIS — Z79899 Other long term (current) drug therapy: Secondary | ICD-10-CM | POA: Diagnosis not present

## 2014-03-06 DIAGNOSIS — M1A00X0 Idiopathic chronic gout, unspecified site, without tophus (tophi): Secondary | ICD-10-CM | POA: Diagnosis not present

## 2014-03-06 DIAGNOSIS — E559 Vitamin D deficiency, unspecified: Secondary | ICD-10-CM | POA: Diagnosis not present

## 2014-03-06 DIAGNOSIS — M2550 Pain in unspecified joint: Secondary | ICD-10-CM | POA: Diagnosis not present

## 2014-03-08 ENCOUNTER — Telehealth: Payer: Self-pay | Admitting: Podiatrist

## 2014-03-08 NOTE — Telephone Encounter (Signed)
Did we start the paperwork for diabetic shoes?  If so , have we contacted her primary-- patient was asking..  Thanks!!  E

## 2014-03-08 NOTE — Telephone Encounter (Signed)
Pt called asking about her diabetic shoes. Did we get orders from pcp on pt.Please call pt and let her know.

## 2014-03-10 ENCOUNTER — Other Ambulatory Visit (HOSPITAL_COMMUNITY): Payer: Self-pay | Admitting: Rheumatology

## 2014-03-10 ENCOUNTER — Ambulatory Visit (HOSPITAL_COMMUNITY)
Admission: RE | Admit: 2014-03-10 | Discharge: 2014-03-10 | Disposition: A | Discharge status: Home / Self Care | Payer: Medicare Other | Source: Ambulatory Visit | Attending: Rheumatology | Admitting: Rheumatology

## 2014-03-10 DIAGNOSIS — M069 Rheumatoid arthritis, unspecified: Secondary | ICD-10-CM | POA: Insufficient documentation

## 2014-03-10 DIAGNOSIS — D849 Immunodeficiency, unspecified: Secondary | ICD-10-CM

## 2014-03-10 DIAGNOSIS — D899 Disorder involving the immune mechanism, unspecified: Principal | ICD-10-CM

## 2014-03-10 DIAGNOSIS — N644 Mastodynia: Secondary | ICD-10-CM | POA: Diagnosis not present

## 2014-03-10 DIAGNOSIS — R32 Unspecified urinary incontinence: Secondary | ICD-10-CM | POA: Diagnosis not present

## 2014-03-10 DIAGNOSIS — R05 Cough: Secondary | ICD-10-CM | POA: Diagnosis not present

## 2014-03-10 DIAGNOSIS — Z01411 Encounter for gynecological examination (general) (routine) with abnormal findings: Secondary | ICD-10-CM | POA: Diagnosis not present

## 2014-03-13 DIAGNOSIS — N644 Mastodynia: Secondary | ICD-10-CM | POA: Diagnosis not present

## 2014-03-17 ENCOUNTER — Other Ambulatory Visit (HOSPITAL_COMMUNITY): Payer: Self-pay | Admitting: *Deleted

## 2014-03-20 ENCOUNTER — Encounter (HOSPITAL_COMMUNITY)
Admission: RE | Admit: 2014-03-20 | Discharge: 2014-03-20 | Disposition: A | Discharge status: Home / Self Care | Payer: Medicare Other | Source: Ambulatory Visit | Attending: Rheumatology | Admitting: Rheumatology

## 2014-03-20 DIAGNOSIS — M069 Rheumatoid arthritis, unspecified: Secondary | ICD-10-CM | POA: Diagnosis not present

## 2014-03-20 DIAGNOSIS — Z5181 Encounter for therapeutic drug level monitoring: Secondary | ICD-10-CM | POA: Insufficient documentation

## 2014-03-20 MED ORDER — DIPHENHYDRAMINE HCL 25 MG PO CAPS
50.0000 mg | ORAL_CAPSULE | ORAL | Status: DC
  Filled 2014-03-20: qty 2

## 2014-03-20 MED ORDER — SODIUM CHLORIDE 0.9 % IV SOLN
INTRAVENOUS | Status: DC
  Administered 2014-03-20: 13:00:00 via INTRAVENOUS

## 2014-03-20 MED ORDER — ACETAMINOPHEN 325 MG PO TABS: 650.0000 mg | ORAL_TABLET | ORAL | Status: DC

## 2014-03-20 MED ORDER — SODIUM CHLORIDE 0.9 % IV SOLN
750.0000 mg | INTRAVENOUS | Status: DC
  Administered 2014-03-20: 750 mg via INTRAVENOUS
  Filled 2014-03-20: qty 30

## 2014-03-24 DIAGNOSIS — R197 Diarrhea, unspecified: Secondary | ICD-10-CM | POA: Diagnosis not present

## 2014-03-24 DIAGNOSIS — M069 Rheumatoid arthritis, unspecified: Secondary | ICD-10-CM | POA: Diagnosis not present

## 2014-03-24 DIAGNOSIS — R600 Localized edema: Secondary | ICD-10-CM | POA: Diagnosis not present

## 2014-03-24 DIAGNOSIS — I10 Essential (primary) hypertension: Secondary | ICD-10-CM | POA: Diagnosis not present

## 2014-03-24 DIAGNOSIS — E1142 Type 2 diabetes mellitus with diabetic polyneuropathy: Secondary | ICD-10-CM | POA: Diagnosis not present

## 2014-03-24 DIAGNOSIS — N183 Chronic kidney disease, stage 3 (moderate): Secondary | ICD-10-CM | POA: Diagnosis not present

## 2014-04-03 ENCOUNTER — Telehealth: Payer: Self-pay | Admitting: Podiatrist

## 2014-04-03 NOTE — Telephone Encounter (Signed)
Patient called wanting to know the status of her diabetic shoes and if her diabetic doctor has signed off on the paperwork. I let her know that you were filling in [place] today and would not be back here until tomorrow.

## 2014-04-04 NOTE — Telephone Encounter (Signed)
Faxing program has sent paperwork several times to Dr Buddy Duty as of today 04/04/14 we have not received a response.  I have sent it a second time myself outside the faxing program.  I have tried on numerous occasions to call Ms Montgomery but her answering machine states "answer is OFF"  Therefore I can not leave her a message.  If Ms Sullender calls back please relay this message

## 2014-04-05 NOTE — Telephone Encounter (Signed)
Okay, I sure will ma'am. Thank you for your help.

## 2014-04-14 ENCOUNTER — Encounter: Payer: Self-pay | Admitting: Podiatrist

## 2014-04-14 ENCOUNTER — Ambulatory Visit (INDEPENDENT_AMBULATORY_CARE_PROVIDER_SITE_OTHER): Payer: Medicare Other | Admitting: Podiatrist

## 2014-04-14 DIAGNOSIS — Z79899 Other long term (current) drug therapy: Secondary | ICD-10-CM | POA: Diagnosis not present

## 2014-04-14 DIAGNOSIS — M79673 Pain in unspecified foot: Secondary | ICD-10-CM | POA: Diagnosis not present

## 2014-04-14 DIAGNOSIS — B351 Tinea unguium: Secondary | ICD-10-CM | POA: Diagnosis not present

## 2014-04-14 DIAGNOSIS — M2040 Other hammer toe(s) (acquired), unspecified foot: Secondary | ICD-10-CM

## 2014-04-14 DIAGNOSIS — E1140 Type 2 diabetes mellitus with diabetic neuropathy, unspecified: Secondary | ICD-10-CM

## 2014-04-14 MED ORDER — UREA 45 % EX CREA: 1.0000 "application " | TOPICAL_CREAM | Freq: Every day | CUTANEOUS | Status: DC

## 2014-04-14 NOTE — Patient Instructions (Signed)
KERASAL-- topical nail treatment for the darkening of the toenails  Urea cream-- there is also an over the counter variety that is 20% if insurance won't pay for the  Prescription form.

## 2014-04-17 ENCOUNTER — Inpatient Hospital Stay (HOSPITAL_COMMUNITY): Admission: RE | Admit: 2014-04-17 | Payer: Medicare Other | Source: Ambulatory Visit

## 2014-04-18 DIAGNOSIS — E1142 Type 2 diabetes mellitus with diabetic polyneuropathy: Secondary | ICD-10-CM | POA: Diagnosis not present

## 2014-04-18 DIAGNOSIS — Z1389 Encounter for screening for other disorder: Secondary | ICD-10-CM | POA: Diagnosis not present

## 2014-04-18 DIAGNOSIS — R000 Tachycardia, unspecified: Secondary | ICD-10-CM | POA: Diagnosis not present

## 2014-04-18 DIAGNOSIS — E039 Hypothyroidism, unspecified: Secondary | ICD-10-CM | POA: Diagnosis not present

## 2014-04-21 ENCOUNTER — Ambulatory Visit: Payer: Medicare Other | Admitting: *Deleted

## 2014-04-21 DIAGNOSIS — L304 Erythema intertrigo: Secondary | ICD-10-CM | POA: Diagnosis not present

## 2014-04-21 DIAGNOSIS — E1140 Type 2 diabetes mellitus with diabetic neuropathy, unspecified: Secondary | ICD-10-CM

## 2014-04-21 DIAGNOSIS — L309 Dermatitis, unspecified: Secondary | ICD-10-CM | POA: Diagnosis not present

## 2014-04-21 NOTE — Progress Notes (Signed)
Patient ID: [name], female   DOB: Feb 15, 1940, 74 y.o.   MRN: [id number] MEASURE AND SCAN FOR DIABETIC SHOES

## 2014-04-28 ENCOUNTER — Encounter (HOSPITAL_COMMUNITY)
Admission: RE | Admit: 2014-04-28 | Discharge: 2014-04-28 | Disposition: A | Discharge status: Home / Self Care | Payer: Medicare Other | Source: Ambulatory Visit | Attending: Rheumatology | Admitting: Rheumatology

## 2014-04-28 DIAGNOSIS — Z5181 Encounter for therapeutic drug level monitoring: Secondary | ICD-10-CM | POA: Insufficient documentation

## 2014-04-28 DIAGNOSIS — M069 Rheumatoid arthritis, unspecified: Secondary | ICD-10-CM | POA: Diagnosis not present

## 2014-04-28 MED ORDER — DIPHENHYDRAMINE HCL 25 MG PO CAPS: 50.0000 mg | ORAL_CAPSULE | ORAL | Status: DC

## 2014-04-28 MED ORDER — ACETAMINOPHEN 325 MG PO TABS: 650.0000 mg | ORAL_TABLET | ORAL | Status: DC

## 2014-04-28 MED ORDER — SODIUM CHLORIDE 0.9 % IV SOLN
750.0000 mg | INTRAVENOUS | Status: DC
  Administered 2014-04-28: 750 mg via INTRAVENOUS
  Filled 2014-04-28: qty 30

## 2014-04-28 MED ORDER — SODIUM CHLORIDE 0.9 % IV SOLN
INTRAVENOUS | Status: DC
  Administered 2014-04-28: 13:00:00 via INTRAVENOUS

## 2014-04-30 DIAGNOSIS — M542 Cervicalgia: Secondary | ICD-10-CM | POA: Diagnosis not present

## 2014-05-02 LAB — QUANTIFERON IN TUBE
QFT TB AG MINUS NIL VALUE: 1.57 IU/mL
QUANTIFERON MITOGEN VALUE: 9.33 [IU]/mL
QUANTIFERON TB AG VALUE: 1.79 IU/mL
QUANTIFERON TB GOLD: POSITIVE — AB
Quantiferon Nil Value: 0.22 IU/mL

## 2014-05-02 LAB — QUANTIFERON TB GOLD ASSAY (BLOOD)

## 2014-05-02 NOTE — Progress Notes (Signed)
HPI:  Patient presents today for follow up of foot and nail care. Denies any new complaints today.  Objective:  Patients chart is reviewed.  Vascular status reveals pedal pulses noted at 2 out of 4 dp and pt bilateral .  Neurological sensation is impaired to [employer] monofilament bilateral.  Patients nails are thickened, discolored, distrophic, friable and brittle with yellow-brown discoloration. Patient subjectively relates they are painful with shoes and with ambulation of bilateral feet. Second toes are moderately swollen from her hammertoe repair but are continueing to improve. No open lesions or ulcerations are present. She previously had an ulceration the left 2nd toe that required the hammertoe repair.   Assessment:  Symptomatic onychomycosis  Plan:  Discussed treatment options and alternatives.  The symptomatic toenails were debrided through manual an mechanical means without complication.  Return appointment recommended at routine intervals of 3 months    Trudie Buckler, DPM

## 2014-05-12 DIAGNOSIS — J209 Acute bronchitis, unspecified: Secondary | ICD-10-CM | POA: Diagnosis not present

## 2014-05-12 DIAGNOSIS — J111 Influenza due to unidentified influenza virus with other respiratory manifestations: Secondary | ICD-10-CM | POA: Diagnosis not present

## 2014-05-17 ENCOUNTER — Ambulatory Visit (INDEPENDENT_AMBULATORY_CARE_PROVIDER_SITE_OTHER): Payer: Medicare Other | Admitting: Podiatrist

## 2014-05-17 DIAGNOSIS — E1140 Type 2 diabetes mellitus with diabetic neuropathy, unspecified: Secondary | ICD-10-CM

## 2014-05-17 DIAGNOSIS — M2040 Other hammer toe(s) (acquired), unspecified foot: Secondary | ICD-10-CM | POA: Diagnosis not present

## 2014-05-17 DIAGNOSIS — M2010 Hallux valgus (acquired), unspecified foot: Secondary | ICD-10-CM

## 2014-05-17 DIAGNOSIS — L97511 Non-pressure chronic ulcer of other part of right foot limited to breakdown of skin: Secondary | ICD-10-CM

## 2014-05-17 DIAGNOSIS — L84 Corns and callosities: Secondary | ICD-10-CM

## 2014-05-17 NOTE — Patient Instructions (Signed)

## 2014-05-17 NOTE — Progress Notes (Signed)
Patient ID: [name], female   DOB: 1940-09-01, 74 y.o.   MRN: [id number] PATIENT PRESENTS STATING THAT HER RIGHT 4TH TOE CALLUS HAS BEEN PROGRESSIVLY HURTING WORSE FOR THE LAST WEEK.  PATIENT ALSO PRESENTS FOR DIABETIC SHOE PICK UP, SHOES ARE TRIED ON FOR GOOD FIT.  PATIENT RECEIVED 1 PAIR ORTHO FEET [place] [address] IN WOMEN'S 8.5 WIDE WITH 3 PAIRS CUSTOM DIABETIC INSERTS.  WRITTEN BREAK IN AND WEAR INSTRUCTIONS GIVEN.

## 2014-05-19 NOTE — Progress Notes (Signed)
Patient presents to pick up diabetic shoes. They are noted to fit her feet well and are free of defect.  She has a shallow callus at the tip of the right fourth toe that is painful.    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  Trimmed the callus and gave instructions for aftercare. She will be seen back for routine care in 2 months.

## 2014-05-25 ENCOUNTER — Other Ambulatory Visit (HOSPITAL_COMMUNITY): Payer: Self-pay | Admitting: *Deleted

## 2014-05-26 ENCOUNTER — Encounter (HOSPITAL_COMMUNITY): Payer: Medicare Other

## 2014-05-26 ENCOUNTER — Encounter (HOSPITAL_COMMUNITY)
Admission: RE | Admit: 2014-05-26 | Discharge: 2014-05-26 | Disposition: A | Discharge status: Home / Self Care | Payer: Medicare Other | Source: Ambulatory Visit | Attending: Rheumatology | Admitting: Rheumatology

## 2014-05-26 DIAGNOSIS — Z5181 Encounter for therapeutic drug level monitoring: Secondary | ICD-10-CM | POA: Diagnosis not present

## 2014-05-26 DIAGNOSIS — M069 Rheumatoid arthritis, unspecified: Secondary | ICD-10-CM | POA: Insufficient documentation

## 2014-05-26 LAB — DIFFERENTIAL
BASOS ABS: 0 10*3/uL (ref 0.0–0.1)
Basophils Relative: 0 % (ref 0–1)
Eosinophils Absolute: 0.2 10*3/uL (ref 0.0–0.7)
Eosinophils Relative: 2 % (ref 0–5)
LYMPHS PCT: 47 % — AB (ref 12–46)
Lymphs Abs: 3 10*3/uL (ref 0.7–4.0)
Monocytes Absolute: 0.4 10*3/uL (ref 0.1–1.0)
Monocytes Relative: 6 % (ref 3–12)
NEUTROS ABS: 3 10*3/uL (ref 1.7–7.7)
NEUTROS PCT: 45 % (ref 43–77)

## 2014-05-26 LAB — COMPREHENSIVE METABOLIC PANEL
ALBUMIN: 4.2 g/dL (ref 3.5–5.0)
ALT: 18 U/L (ref 14–54)
AST: 26 U/L (ref 15–41)
Alkaline Phosphatase: 73 U/L (ref 38–126)
Anion gap: 11 (ref 5–15)
BUN: 8 mg/dL (ref 6–20)
CALCIUM: 9.7 mg/dL (ref 8.9–10.3)
CO2: 24 mmol/L (ref 22–32)
Chloride: 106 mmol/L (ref 101–111)
Creatinine, Ser: 1.46 mg/dL — ABNORMAL HIGH (ref 0.44–1.00)
GFR calc Af Amer: 39 mL/min — ABNORMAL LOW (ref >60)
GFR, EST NON AFRICAN AMERICAN: 34 mL/min — AB (ref >60)
Glucose, Bld: 129 mg/dL — ABNORMAL HIGH (ref 65–99)
Potassium: 3.9 mmol/L (ref 3.5–5.1)
SODIUM: 141 mmol/L (ref 135–145)
TOTAL PROTEIN: 8.1 g/dL (ref 6.5–8.1)
Total Bilirubin: 0.7 mg/dL (ref 0.3–1.2)

## 2014-05-26 LAB — CBC
HCT: 35.2 % — ABNORMAL LOW (ref 36.0–46.0)
Hemoglobin: 11.4 g/dL — ABNORMAL LOW (ref 12.0–15.0)
MCH: 27.7 pg (ref 26.0–34.0)
MCHC: 32.4 g/dL (ref 30.0–36.0)
MCV: 85.6 fL (ref 78.0–100.0)
Platelets: 275 10*3/uL (ref 150–400)
RBC: 4.11 MIL/uL (ref 3.87–5.11)
RDW: 14.1 % (ref 11.5–15.5)
WBC: 6.6 10*3/uL (ref 4.0–10.5)

## 2014-05-26 MED ORDER — SODIUM CHLORIDE 0.9 % IV SOLN
INTRAVENOUS | Status: DC
  Administered 2014-05-26: 13:00:00 via INTRAVENOUS

## 2014-05-26 MED ORDER — DIPHENHYDRAMINE HCL 25 MG PO CAPS: 50.0000 mg | ORAL_CAPSULE | ORAL | Status: DC

## 2014-05-26 MED ORDER — ACETAMINOPHEN 325 MG PO TABS: 650.0000 mg | ORAL_TABLET | Freq: Four times a day (QID) | ORAL | Status: DC | PRN

## 2014-05-26 MED ORDER — ABATACEPT 250 MG IV SOLR
750.0000 mg | INTRAVENOUS | Status: DC
  Administered 2014-05-26: 750 mg via INTRAVENOUS
  Filled 2014-05-26: qty 30

## 2014-05-30 DIAGNOSIS — R52 Pain, unspecified: Secondary | ICD-10-CM | POA: Diagnosis not present

## 2014-05-30 DIAGNOSIS — M797 Fibromyalgia: Secondary | ICD-10-CM | POA: Diagnosis not present

## 2014-05-30 DIAGNOSIS — G894 Chronic pain syndrome: Secondary | ICD-10-CM | POA: Diagnosis not present

## 2014-05-30 DIAGNOSIS — R002 Palpitations: Secondary | ICD-10-CM | POA: Diagnosis not present

## 2014-05-30 DIAGNOSIS — I10 Essential (primary) hypertension: Secondary | ICD-10-CM | POA: Diagnosis not present

## 2014-05-30 DIAGNOSIS — D638 Anemia in other chronic diseases classified elsewhere: Secondary | ICD-10-CM | POA: Diagnosis not present

## 2014-05-30 DIAGNOSIS — K632 Fistula of intestine: Secondary | ICD-10-CM | POA: Diagnosis not present

## 2014-05-30 DIAGNOSIS — M109 Gout, unspecified: Secondary | ICD-10-CM | POA: Diagnosis not present

## 2014-05-30 DIAGNOSIS — J45991 Cough variant asthma: Secondary | ICD-10-CM | POA: Diagnosis not present

## 2014-05-30 DIAGNOSIS — E559 Vitamin D deficiency, unspecified: Secondary | ICD-10-CM | POA: Diagnosis not present

## 2014-05-30 DIAGNOSIS — N179 Acute kidney failure, unspecified: Secondary | ICD-10-CM | POA: Diagnosis not present

## 2014-05-30 DIAGNOSIS — M0570 Rheumatoid arthritis with rheumatoid factor of unspecified site without organ or systems involvement: Secondary | ICD-10-CM | POA: Diagnosis not present

## 2014-05-30 DIAGNOSIS — J301 Allergic rhinitis due to pollen: Secondary | ICD-10-CM | POA: Diagnosis not present

## 2014-06-23 ENCOUNTER — Encounter (HOSPITAL_COMMUNITY)
Admission: RE | Admit: 2014-06-23 | Discharge: 2014-06-23 | Disposition: A | Discharge status: Home / Self Care | Payer: Medicare Other | Source: Ambulatory Visit | Attending: Rheumatology | Admitting: Rheumatology

## 2014-06-23 DIAGNOSIS — M069 Rheumatoid arthritis, unspecified: Secondary | ICD-10-CM | POA: Insufficient documentation

## 2014-06-23 DIAGNOSIS — Z5181 Encounter for therapeutic drug level monitoring: Secondary | ICD-10-CM | POA: Insufficient documentation

## 2014-06-23 MED ORDER — SODIUM CHLORIDE 0.9 % IV SOLN
750.0000 mg | INTRAVENOUS | Status: DC
  Administered 2014-06-23: 750 mg via INTRAVENOUS
  Filled 2014-06-23: qty 30

## 2014-06-23 MED ORDER — ACETAMINOPHEN 325 MG PO TABS: 650.0000 mg | ORAL_TABLET | ORAL | Status: DC

## 2014-06-23 MED ORDER — ACETAMINOPHEN 325 MG PO TABS
ORAL_TABLET | ORAL | Status: AC
  Filled 2014-06-23: qty 2

## 2014-06-23 MED ORDER — SODIUM CHLORIDE 0.9 % IV SOLN
INTRAVENOUS | Status: DC
Start: 2014-06-23 — End: 2014-06-24
  Administered 2014-06-23: 13:00:00 via INTRAVENOUS

## 2014-06-23 MED ORDER — DIPHENHYDRAMINE HCL 25 MG PO TABS
50.0000 mg | ORAL_TABLET | ORAL | Status: DC
  Filled 2014-06-23: qty 2

## 2014-06-23 MED ORDER — DIPHENHYDRAMINE HCL 25 MG PO CAPS
ORAL_CAPSULE | ORAL | Status: AC
  Filled 2014-06-23: qty 2

## 2014-07-12 DIAGNOSIS — Z6832 Body mass index (BMI) 32.0-32.9, adult: Secondary | ICD-10-CM | POA: Diagnosis not present

## 2014-07-12 DIAGNOSIS — M5126 Other intervertebral disc displacement, lumbar region: Secondary | ICD-10-CM | POA: Diagnosis not present

## 2014-07-12 DIAGNOSIS — M545 Low back pain: Secondary | ICD-10-CM | POA: Diagnosis not present

## 2014-07-12 DIAGNOSIS — I10 Essential (primary) hypertension: Secondary | ICD-10-CM | POA: Diagnosis not present

## 2014-07-12 DIAGNOSIS — M5416 Radiculopathy, lumbar region: Secondary | ICD-10-CM | POA: Diagnosis not present

## 2014-07-12 DIAGNOSIS — D329 Benign neoplasm of meninges, unspecified: Secondary | ICD-10-CM | POA: Diagnosis not present

## 2014-07-13 DIAGNOSIS — M1711 Unilateral primary osteoarthritis, right knee: Secondary | ICD-10-CM | POA: Diagnosis not present

## 2014-07-13 DIAGNOSIS — Z96652 Presence of left artificial knee joint: Secondary | ICD-10-CM | POA: Diagnosis not present

## 2014-07-17 DIAGNOSIS — Z09 Encounter for follow-up examination after completed treatment for conditions other than malignant neoplasm: Secondary | ICD-10-CM | POA: Diagnosis not present

## 2014-07-17 DIAGNOSIS — M069 Rheumatoid arthritis, unspecified: Secondary | ICD-10-CM | POA: Diagnosis not present

## 2014-07-17 DIAGNOSIS — M25552 Pain in left hip: Secondary | ICD-10-CM | POA: Diagnosis not present

## 2014-07-17 DIAGNOSIS — M25531 Pain in right wrist: Secondary | ICD-10-CM | POA: Diagnosis not present

## 2014-07-17 DIAGNOSIS — M79641 Pain in right hand: Secondary | ICD-10-CM | POA: Diagnosis not present

## 2014-07-21 ENCOUNTER — Inpatient Hospital Stay (HOSPITAL_COMMUNITY): Admission: RE | Admit: 2014-07-21 | Payer: Medicare Other | Source: Ambulatory Visit

## 2014-07-21 ENCOUNTER — Ambulatory Visit (INDEPENDENT_AMBULATORY_CARE_PROVIDER_SITE_OTHER): Payer: Medicare Other | Admitting: Podiatry

## 2014-07-21 DIAGNOSIS — E1140 Type 2 diabetes mellitus with diabetic neuropathy, unspecified: Secondary | ICD-10-CM

## 2014-07-21 DIAGNOSIS — B351 Tinea unguium: Secondary | ICD-10-CM

## 2014-07-21 DIAGNOSIS — M79676 Pain in unspecified toe(s): Secondary | ICD-10-CM | POA: Diagnosis not present

## 2014-07-24 DIAGNOSIS — D329 Benign neoplasm of meninges, unspecified: Secondary | ICD-10-CM | POA: Diagnosis not present

## 2014-07-25 NOTE — Progress Notes (Signed)
Patient ID: [name], female   DOB: [birth year], 74 y.o.   MRN: [id number]  Subjective: 74 y.o. female returns the office today for painful, elongated, thickened toenails which she is unable to trim herself. Denies any redness or drainage around the nails. Denies any acute changes since last appointment and no new complaints today. Denies any systemic complaints such as fevers, chills, nausea, vomiting.   Objective: AAO 3, NAD DP/PT pulses palpable, CRT less than 3 seconds Protective sensation decreased with [employer] monofilament, Achilles tendon reflex intact.  Nails hypertrophic, dystrophic, elongated, brittle, discolored 10. There is tenderness overlying the nails 1-5 bilaterally. There is no surrounding erythema or drainage along the nail sites. No open lesions or pre-ulcerative lesions are identified. No other areas of tenderness bilateral lower extremities. No overlying edema, erythema, increased warmth. No pain with calf compression, swelling, warmth, erythema.  Assessment: Patient presents with symptomatic onychomycosis  Plan: -Treatment options including alternatives, risks, complications were discussed -Nails sharply debrided 10 without complication/bleeding. -Discussed daily foot inspection. If there are any changes, to call the office immediately.  -Follow-up in 3 months or sooner if any problems are to arise. In the meantime, encouraged to call the office with any questions, concerns, changes symptoms.   Celesta Gentile, DPM

## 2014-07-27 ENCOUNTER — Other Ambulatory Visit (HOSPITAL_COMMUNITY): Payer: Self-pay

## 2014-07-27 DIAGNOSIS — E1142 Type 2 diabetes mellitus with diabetic polyneuropathy: Secondary | ICD-10-CM | POA: Diagnosis not present

## 2014-07-27 DIAGNOSIS — R51 Headache: Secondary | ICD-10-CM | POA: Diagnosis not present

## 2014-07-27 DIAGNOSIS — D329 Benign neoplasm of meninges, unspecified: Secondary | ICD-10-CM | POA: Diagnosis not present

## 2014-07-27 DIAGNOSIS — E039 Hypothyroidism, unspecified: Secondary | ICD-10-CM | POA: Diagnosis not present

## 2014-07-28 ENCOUNTER — Other Ambulatory Visit: Payer: Self-pay

## 2014-07-28 ENCOUNTER — Encounter (HOSPITAL_COMMUNITY)
Admission: RE | Admit: 2014-07-28 | Discharge: 2014-07-28 | Disposition: A | Discharge status: Home / Self Care | Payer: Medicare Other | Source: Ambulatory Visit | Attending: Rheumatology | Admitting: Rheumatology

## 2014-07-28 ENCOUNTER — Encounter (HOSPITAL_COMMUNITY): Payer: Self-pay

## 2014-07-28 ENCOUNTER — Emergency Department (HOSPITAL_COMMUNITY)
Admission: EM | Admit: 2014-07-28 | Discharge: 2014-07-28 | Disposition: A | Discharge status: Home / Self Care | Payer: Medicare Other | Attending: Emergency Medicine | Admitting: Emergency Medicine

## 2014-07-28 DIAGNOSIS — Z9861 Coronary angioplasty status: Secondary | ICD-10-CM | POA: Diagnosis not present

## 2014-07-28 DIAGNOSIS — R7989 Other specified abnormal findings of blood chemistry: Secondary | ICD-10-CM | POA: Diagnosis present

## 2014-07-28 DIAGNOSIS — E780 Pure hypercholesterolemia: Secondary | ICD-10-CM | POA: Diagnosis not present

## 2014-07-28 DIAGNOSIS — G629 Polyneuropathy, unspecified: Secondary | ICD-10-CM | POA: Diagnosis not present

## 2014-07-28 DIAGNOSIS — R531 Weakness: Secondary | ICD-10-CM | POA: Insufficient documentation

## 2014-07-28 DIAGNOSIS — Z5181 Encounter for therapeutic drug level monitoring: Secondary | ICD-10-CM | POA: Diagnosis not present

## 2014-07-28 DIAGNOSIS — E039 Hypothyroidism, unspecified: Secondary | ICD-10-CM | POA: Diagnosis not present

## 2014-07-28 DIAGNOSIS — Z79899 Other long term (current) drug therapy: Secondary | ICD-10-CM | POA: Diagnosis not present

## 2014-07-28 DIAGNOSIS — I129 Hypertensive chronic kidney disease with stage 1 through stage 4 chronic kidney disease, or unspecified chronic kidney disease: Secondary | ICD-10-CM | POA: Insufficient documentation

## 2014-07-28 DIAGNOSIS — Z8719 Personal history of other diseases of the digestive system: Secondary | ICD-10-CM | POA: Diagnosis not present

## 2014-07-28 DIAGNOSIS — D649 Anemia, unspecified: Secondary | ICD-10-CM | POA: Insufficient documentation

## 2014-07-28 DIAGNOSIS — G8929 Other chronic pain: Secondary | ICD-10-CM | POA: Insufficient documentation

## 2014-07-28 DIAGNOSIS — Z880 Allergy status to penicillin: Secondary | ICD-10-CM | POA: Insufficient documentation

## 2014-07-28 DIAGNOSIS — Z86018 Personal history of other benign neoplasm: Secondary | ICD-10-CM | POA: Diagnosis not present

## 2014-07-28 DIAGNOSIS — M069 Rheumatoid arthritis, unspecified: Secondary | ICD-10-CM | POA: Diagnosis not present

## 2014-07-28 DIAGNOSIS — E119 Type 2 diabetes mellitus without complications: Secondary | ICD-10-CM | POA: Diagnosis not present

## 2014-07-28 DIAGNOSIS — Z87891 Personal history of nicotine dependence: Secondary | ICD-10-CM | POA: Diagnosis not present

## 2014-07-28 DIAGNOSIS — N183 Chronic kidney disease, stage 3 (moderate): Secondary | ICD-10-CM | POA: Diagnosis not present

## 2014-07-28 LAB — COMPREHENSIVE METABOLIC PANEL
ALBUMIN: 3.3 g/dL — AB (ref 3.5–5.0)
ALT: 17 U/L (ref 14–54)
AST: 44 U/L — ABNORMAL HIGH (ref 15–41)
Alkaline Phosphatase: 101 U/L (ref 38–126)
Anion gap: 4 — ABNORMAL LOW (ref 5–15)
BUN: 22 mg/dL — ABNORMAL HIGH (ref 6–20)
CO2: 24 mmol/L (ref 22–32)
Calcium: 9 mg/dL (ref 8.9–10.3)
Chloride: 108 mmol/L (ref 101–111)
Creatinine, Ser: 1.34 mg/dL — ABNORMAL HIGH (ref 0.44–1.00)
GFR calc Af Amer: 44 mL/min — ABNORMAL LOW (ref >60)
GFR calc non Af Amer: 38 mL/min — ABNORMAL LOW (ref >60)
Glucose, Bld: 118 mg/dL — ABNORMAL HIGH (ref 65–99)
POTASSIUM: 6.3 mmol/L — AB (ref 3.5–5.1)
Sodium: 136 mmol/L (ref 135–145)
Total Bilirubin: 1.7 mg/dL — ABNORMAL HIGH (ref 0.3–1.2)
Total Protein: 6.5 g/dL (ref 6.5–8.1)

## 2014-07-28 LAB — DIFFERENTIAL
Basophils Absolute: 0 10*3/uL (ref 0.0–0.1)
Basophils Relative: 0 % (ref 0–1)
EOS PCT: 1 % (ref 0–5)
Eosinophils Absolute: 0.1 10*3/uL (ref 0.0–0.7)
LYMPHS ABS: 3.3 10*3/uL (ref 0.7–4.0)
Lymphocytes Relative: 36 % (ref 12–46)
MONOS PCT: 7 % (ref 3–12)
Monocytes Absolute: 0.6 10*3/uL (ref 0.1–1.0)
Neutro Abs: 5 10*3/uL (ref 1.7–7.7)
Neutrophils Relative %: 56 % (ref 43–77)

## 2014-07-28 LAB — URINALYSIS, ROUTINE W REFLEX MICROSCOPIC
BILIRUBIN URINE: NEGATIVE
Glucose, UA: NEGATIVE mg/dL
Hgb urine dipstick: NEGATIVE
Ketones, ur: NEGATIVE mg/dL
Leukocytes, UA: NEGATIVE
Nitrite: NEGATIVE
Protein, ur: NEGATIVE mg/dL
Specific Gravity, Urine: 1.007 (ref 1.005–1.030)
UROBILINOGEN UA: 0.2 mg/dL (ref 0.0–1.0)
pH: 6 (ref 5.0–8.0)

## 2014-07-28 LAB — BASIC METABOLIC PANEL
Anion gap: 7 (ref 5–15)
BUN: 23 mg/dL — AB (ref 6–20)
CO2: 25 mmol/L (ref 22–32)
CREATININE: 1.31 mg/dL — AB (ref 0.44–1.00)
Calcium: 9.2 mg/dL (ref 8.9–10.3)
Chloride: 109 mmol/L (ref 101–111)
GFR calc Af Amer: 45 mL/min — ABNORMAL LOW (ref >60)
GFR, EST NON AFRICAN AMERICAN: 39 mL/min — AB (ref >60)
Glucose, Bld: 94 mg/dL (ref 65–99)
Potassium: 4.2 mmol/L (ref 3.5–5.1)
SODIUM: 141 mmol/L (ref 135–145)

## 2014-07-28 LAB — CBC
HEMATOCRIT: 27.1 % — AB (ref 36.0–46.0)
HEMATOCRIT: 28.6 % — AB (ref 36.0–46.0)
Hemoglobin: 8.9 g/dL — ABNORMAL LOW (ref 12.0–15.0)
Hemoglobin: 9.4 g/dL — ABNORMAL LOW (ref 12.0–15.0)
MCH: 27.5 pg (ref 26.0–34.0)
MCH: 28.1 pg (ref 26.0–34.0)
MCHC: 32.8 g/dL (ref 30.0–36.0)
MCHC: 32.9 g/dL (ref 30.0–36.0)
MCV: 83.6 fL (ref 78.0–100.0)
MCV: 85.6 fL (ref 78.0–100.0)
PLATELETS: 295 10*3/uL (ref 150–400)
Platelets: 326 10*3/uL (ref 150–400)
RBC: 3.24 MIL/uL — ABNORMAL LOW (ref 3.87–5.11)
RBC: 3.34 MIL/uL — ABNORMAL LOW (ref 3.87–5.11)
RDW: 15.7 % — AB (ref 11.5–15.5)
RDW: 15.6 % — AB (ref 11.5–15.5)
WBC: 9 10*3/uL (ref 4.0–10.5)
WBC: 10.2 10*3/uL (ref 4.0–10.5)

## 2014-07-28 LAB — CBG MONITORING, ED: GLUCOSE-CAPILLARY: 84 mg/dL (ref 65–99)

## 2014-07-28 MED ORDER — ACETAMINOPHEN 325 MG PO TABS: 650.0000 mg | ORAL_TABLET | ORAL | Status: DC

## 2014-07-28 MED ORDER — ACETAMINOPHEN 325 MG PO TABS
ORAL_TABLET | ORAL | Status: AC
  Filled 2014-07-28: qty 2

## 2014-07-28 MED ORDER — SODIUM CHLORIDE 0.9 % IV SOLN
750.0000 mg | INTRAVENOUS | Status: DC
  Administered 2014-07-28: 750 mg via INTRAVENOUS
  Filled 2014-07-28: qty 30

## 2014-07-28 MED ORDER — DIPHENHYDRAMINE HCL 25 MG PO CAPS
ORAL_CAPSULE | ORAL | Status: AC
  Filled 2014-07-28: qty 2

## 2014-07-28 MED ORDER — DIPHENHYDRAMINE HCL 25 MG PO TABS
50.0000 mg | ORAL_TABLET | ORAL | Status: DC
  Administered 2014-07-28: 50 mg via ORAL
  Filled 2014-07-28: qty 2

## 2014-07-28 MED ORDER — SODIUM CHLORIDE 0.9 % IV SOLN
INTRAVENOUS | Status: DC
  Administered 2014-07-28: 13:00:00 via INTRAVENOUS

## 2014-07-28 NOTE — Discharge Instructions (Signed)
Return to the ED with any concerns including fainting, palpitations, vomiting and not able to keep down liquids, decreased level of alertness/lethargy, or any other alarming symptoms  Your potassium was normal tonight in the ED on recheck.

## 2014-07-28 NOTE — ED Notes (Signed)
MD at bedside. 

## 2014-07-28 NOTE — ED Notes (Signed)
Patient aware that a urine sample is needed, will notify staff when able.

## 2014-07-28 NOTE — ED Notes (Signed)
Bed: WA06 Expected date:  Expected time:  Means of arrival:  Comments: Save  For triage 2

## 2014-07-28 NOTE — ED Provider Notes (Addendum)
CSN: [id number]     Arrival date & time 07/28/14  1735 History   First MD Initiated Contact with Patient 07/28/14 1753     Chief Complaint  Patient presents with  . Abnormal Lab     (Consider location/radiation/quality/duration/timing/severity/associated sxs/prior Treatment) HPI  Pt presenting due to being called and informed that her potassium was 6.3.  She was at short stay for orencia infusion for her RA.  She was called after leaving and informed about elevated potassium.  She states the infusion was otherwise uneventful.  She does note that she has been feeling more tired than usual since yesterday, better today.  No fever/chills.  No chest pain, no palpitations.  No dysuria.  There are no other associated systemic symptoms, there are no other alleviating or modifying factors.   Past Medical History  Diagnosis Date  . Rheumatoid arthritis(714.0)   . Glaucoma   . HTN (hypertension)   . Hypothyroidism   . Diabetes mellitus   . Hypercholesteremia   . Chronic pain   . Allergic rhinitis   . Peripheral neuropathy   . Osteoarthritis   . Diverticulosis   . Hemorrhoids     internal  . PPD positive     6 months ago  . Anemia   . Sickle cell trait   . Meningioma 2008  . CKD (chronic kidney disease), stage III   . Aortic stenosis 12/22/2012    Mild 3/14-2.35 m/s peak velocity  . Hyperlipidemia 12/22/2012  . PVC (premature ventricular contraction) 12/22/2012   Past Surgical History  Procedure Laterality Date  . Joint replacement  2001    left TKR  . Fistula repair  03/2010    partial colectomy  . Intracranial surgery  11/2007    removal of meningioma  . Total thyroidectomy  1992  . Rotator cuff repair  1999    BIlateral  . Sigmoidectomy  2012  . Abdominal hysterectomy  1978    TAH,& BSO 1  . Tonsillectomy and adenoidectomy    . Wrist sx      right   . Left heart catheterization with coronary angiogram N/A 08/08/2013    Procedure: LEFT HEART CATHETERIZATION WITH CORONARY  ANGIOGRAM;  Surgeon: Peter M Martinique, MD;  Location: [facility] CATH LAB;  Service: Cardiovascular;  Laterality: N/A;   Family History  Problem Relation Age of Onset  . Hypotension Mother   . CVA Mother   . Diabetes Mother   . Hypertension Father   . Kidney failure Father    History  Substance Use Topics  . Smoking status: Former Smoker    Quit date: 01/06/1962  . Smokeless tobacco: Never Used  . Alcohol Use: Yes     Comment: occasionally   OB History    No data available     Review of Systems  ROS reviewed and all otherwise negative except for mentioned in HPI    Allergies  Penicillins; Other; and Zocor  Home Medications   Prior to Admission medications   Medication Sig Start Date End Date Taking? Authorizing Provider  Abatacept (ORENCIA IV) Inject into the vein every 30 (thirty) days.    Yes Historical Provider, MD  allopurinol (ZYLOPRIM) 300 MG tablet Take 300 mg by mouth daily.   Yes Historical Provider, MD  atorvastatin (LIPITOR) 40 MG tablet Take 40 mg by mouth daily.   Yes Historical Provider, MD  cholecalciferol (VITAMIN D) 1000 UNITS tablet Take 800 Units by mouth daily. 2 capsule   Yes Historical Provider, MD  clobetasol cream (TEMOVATE) AB-123456789 % Apply 1 application topically daily as needed. For rash on leg 02/11/13  Yes Historical Provider, MD  cyclobenzaprine (FLEXERIL) 10 MG tablet Take 10 mg by mouth 3 (three) times daily as needed for muscle spasms.    Yes Historical Provider, MD  diclofenac sodium (VOLTAREN) 1 % GEL Apply 2 g topically 4 (four) times daily as needed (pain). Rub into affected area of foot 2 to 4 times daily 01/19/13  Yes Bronson Ing, DPM  ferrous fumarate (HEMOCYTE - 106 MG FE) 325 (106 FE) MG TABS Take 1 tablet by mouth.   Yes Historical Provider, MD  folic acid (FOLVITE) 1 MG tablet Take 1 mg by mouth daily.   Yes Historical Provider, MD  gabapentin (NEURONTIN) 100 MG capsule Take 100 mg by mouth 3 (three) times daily as needed (pain).    Yes  Historical Provider, MD  ketoconazole (NIZORAL) 2 % cream Apply 1 application topically daily as needed for irritation. Use on the bottom of feet 03/02/13  Yes Historical Provider, MD  levothyroxine (SYNTHROID, LEVOTHROID) 200 MCG tablet Take 200 mcg by mouth daily before breakfast.   Yes Historical Provider, MD  losartan (COZAAR) 50 MG tablet Take 50 mg by mouth daily.   Yes Historical Provider, MD  multivitamin [facility]) per tablet Take 1 tablet by mouth daily.   Yes Historical Provider, MD  NOVOLIN 70/30 RELION (70-30) 100 UNIT/ML injection Inject 8 Units into the skin 2 (two) times daily. For high blood sugar 07/27/14  Yes Historical Provider, MD  pregabalin (LYRICA) 75 MG capsule Take 75 mg by mouth 2 (two) times daily.   Yes Historical Provider, MD  PROAIR HFA 108 (90 BASE) MCG/ACT inhaler Inhale 1-2 puffs into the lungs every 6 (six) hours as needed. 05/12/14  Yes Historical Provider, MD  silver sulfADIAZINE (SILVADENE) 1 % cream Apply 1 application topically daily. 04/13/13  Yes Viona Gilmore Egerton, DPM  Urea 45 % CREA Apply 1 application topically daily. 04/14/14  Yes Bronson Ing, DPM  zolpidem (AMBIEN) 10 MG tablet Take 10 mg by mouth at bedtime as needed for sleep.    Yes Historical Provider, MD  fluocinonide ointment (LIDEX) AB-123456789 % Apply 1 application topically 2 (two) times daily. Patient not taking: Reported on 07/28/2014 08/31/13   Bronson Ing, DPM  HYDROcodone-acetaminophen (NORCO/VICODIN) 5-325 MG per tablet Take 2 tablets by mouth every 4 (four) hours as needed. Patient not taking: Reported on 07/28/2014 03/01/14   Fransico Meadow, PA-C   BP 140/51 mmHg  Pulse 62  Temp(Src) 97.9 F (36.6 C) (Oral)  Resp 18  SpO2 99%  Vitals reviewed Physical Exam  Physical Examination: General appearance - alert, well appearing, and in no distress Mental status - alert, oriented to person, place, and time Eyes - no conjunctival injection, no scleral icterus Mouth - mucous membranes moist,  pharynx normal without lesions Chest - clear to auscultation, no wheezes, rales or rhonchi, symmetric air entry Heart - normal rate, regular rhythm, normal S1, S2, no murmurs, rubs, clicks or gallops Abdomen - soft, nontender, nondistended, no masses or organomegaly Neurological - alert, oriented x 3, cranial nerves grossly intact, strength 5/5 in extremities, sensation intact Extremities - peripheral pulses normal, no pedal edema, no clubbing or cyanosis Skin - normal coloration and turgor, no rashes  ED Course  Procedures (including critical care time) Labs Review Labs Reviewed  CBC - Abnormal; Notable for the following:    RBC 3.34 (*)  Hemoglobin 9.4 (*)    HCT 28.6 (*)    RDW 15.6 (*)    All other components within normal limits  BASIC METABOLIC PANEL - Abnormal; Notable for the following:    BUN 23 (*)    Creatinine, Ser 1.31 (*)    GFR calc non Af Amer 39 (*)    GFR calc Af Amer 45 (*)    All other components within normal limits  URINALYSIS, ROUTINE W REFLEX MICROSCOPIC (NOT AT [facility])  CBG MONITORING, ED    Imaging Review No results found.   EKG Interpretation None      Date: 07/30/2014  Rate: 60  Rhythm: normal sinus rhythm  QRS Axis: normal  Intervals: normal  ST/T Wave abnormalities: normal  Conduction Disutrbances: none  Narrative Interpretation: unremarkable, compared to prior ekg LVH was less evident    MDM   Final diagnoses:  Generalized weakness    Pt presenting with c/o being notified of high potassium.  On recheck she has a normal potassium.  Incidentally, she has been feeling more tired than usual.  Her anemia is at her baseline, urinalysis is clear.  Discharged with strict return precautions.  Pt agreeable with plan.    Alfonzo Beers, MD 07/30/14 [place], MD 07/30/14 [phone]

## 2014-07-28 NOTE — ED Notes (Signed)
Patient was at [facility] for [facility].infusion. Patient was called and told to come to the ED for potassium 6.3. Patient has mild confusion as well.

## 2014-08-01 DIAGNOSIS — R262 Difficulty in walking, not elsewhere classified: Secondary | ICD-10-CM | POA: Diagnosis not present

## 2014-08-01 DIAGNOSIS — M25561 Pain in right knee: Secondary | ICD-10-CM | POA: Diagnosis not present

## 2014-08-01 DIAGNOSIS — M1711 Unilateral primary osteoarthritis, right knee: Secondary | ICD-10-CM | POA: Diagnosis not present

## 2014-08-01 DIAGNOSIS — M170 Bilateral primary osteoarthritis of knee: Secondary | ICD-10-CM | POA: Diagnosis not present

## 2014-08-04 DIAGNOSIS — M5126 Other intervertebral disc displacement, lumbar region: Secondary | ICD-10-CM | POA: Diagnosis not present

## 2014-08-04 DIAGNOSIS — M47816 Spondylosis without myelopathy or radiculopathy, lumbar region: Secondary | ICD-10-CM | POA: Diagnosis not present

## 2014-08-04 DIAGNOSIS — M5417 Radiculopathy, lumbosacral region: Secondary | ICD-10-CM | POA: Diagnosis not present

## 2014-08-04 DIAGNOSIS — M545 Low back pain: Secondary | ICD-10-CM | POA: Diagnosis not present

## 2014-08-08 DIAGNOSIS — R2689 Other abnormalities of gait and mobility: Secondary | ICD-10-CM | POA: Diagnosis not present

## 2014-08-08 DIAGNOSIS — M25561 Pain in right knee: Secondary | ICD-10-CM | POA: Diagnosis not present

## 2014-08-08 DIAGNOSIS — M1711 Unilateral primary osteoarthritis, right knee: Secondary | ICD-10-CM | POA: Diagnosis not present

## 2014-08-08 DIAGNOSIS — M170 Bilateral primary osteoarthritis of knee: Secondary | ICD-10-CM | POA: Diagnosis not present

## 2014-08-08 DIAGNOSIS — M25562 Pain in left knee: Secondary | ICD-10-CM | POA: Diagnosis not present

## 2014-08-15 DIAGNOSIS — M25562 Pain in left knee: Secondary | ICD-10-CM | POA: Diagnosis not present

## 2014-08-15 DIAGNOSIS — R2689 Other abnormalities of gait and mobility: Secondary | ICD-10-CM | POA: Diagnosis not present

## 2014-08-15 DIAGNOSIS — M1711 Unilateral primary osteoarthritis, right knee: Secondary | ICD-10-CM | POA: Diagnosis not present

## 2014-08-15 DIAGNOSIS — M25561 Pain in right knee: Secondary | ICD-10-CM | POA: Diagnosis not present

## 2014-08-15 DIAGNOSIS — M170 Bilateral primary osteoarthritis of knee: Secondary | ICD-10-CM | POA: Diagnosis not present

## 2014-08-21 DIAGNOSIS — H40023 Open angle with borderline findings, high risk, bilateral: Secondary | ICD-10-CM | POA: Diagnosis not present

## 2014-08-21 DIAGNOSIS — H2513 Age-related nuclear cataract, bilateral: Secondary | ICD-10-CM | POA: Diagnosis not present

## 2014-08-22 ENCOUNTER — Encounter (HOSPITAL_COMMUNITY): Payer: Self-pay | Admitting: Emergency Medicine

## 2014-08-22 ENCOUNTER — Emergency Department (HOSPITAL_COMMUNITY): Payer: Medicare Other

## 2014-08-22 ENCOUNTER — Observation Stay (HOSPITAL_COMMUNITY)
Admission: EM | Admit: 2014-08-22 | Discharge: 2014-08-26 | Disposition: A | Discharge status: Home / Self Care | Payer: Medicare Other | Attending: Internal Medicine | Admitting: Internal Medicine

## 2014-08-22 DIAGNOSIS — G8929 Other chronic pain: Secondary | ICD-10-CM | POA: Diagnosis not present

## 2014-08-22 DIAGNOSIS — N183 Chronic kidney disease, stage 3 (moderate): Secondary | ICD-10-CM | POA: Diagnosis not present

## 2014-08-22 DIAGNOSIS — E785 Hyperlipidemia, unspecified: Secondary | ICD-10-CM

## 2014-08-22 DIAGNOSIS — E162 Hypoglycemia, unspecified: Secondary | ICD-10-CM | POA: Diagnosis present

## 2014-08-22 DIAGNOSIS — Z79899 Other long term (current) drug therapy: Secondary | ICD-10-CM | POA: Diagnosis not present

## 2014-08-22 DIAGNOSIS — Z9889 Other specified postprocedural states: Secondary | ICD-10-CM | POA: Diagnosis not present

## 2014-08-22 DIAGNOSIS — D649 Anemia, unspecified: Secondary | ICD-10-CM | POA: Insufficient documentation

## 2014-08-22 DIAGNOSIS — Z880 Allergy status to penicillin: Secondary | ICD-10-CM | POA: Insufficient documentation

## 2014-08-22 DIAGNOSIS — H409 Unspecified glaucoma: Secondary | ICD-10-CM | POA: Insufficient documentation

## 2014-08-22 DIAGNOSIS — Z8719 Personal history of other diseases of the digestive system: Secondary | ICD-10-CM | POA: Insufficient documentation

## 2014-08-22 DIAGNOSIS — E11649 Type 2 diabetes mellitus with hypoglycemia without coma: Secondary | ICD-10-CM | POA: Diagnosis not present

## 2014-08-22 DIAGNOSIS — I129 Hypertensive chronic kidney disease with stage 1 through stage 4 chronic kidney disease, or unspecified chronic kidney disease: Secondary | ICD-10-CM | POA: Diagnosis not present

## 2014-08-22 DIAGNOSIS — E780 Pure hypercholesterolemia: Secondary | ICD-10-CM | POA: Insufficient documentation

## 2014-08-22 DIAGNOSIS — G629 Polyneuropathy, unspecified: Secondary | ICD-10-CM | POA: Diagnosis not present

## 2014-08-22 DIAGNOSIS — Z87891 Personal history of nicotine dependence: Secondary | ICD-10-CM | POA: Diagnosis not present

## 2014-08-22 DIAGNOSIS — M069 Rheumatoid arthritis, unspecified: Secondary | ICD-10-CM | POA: Insufficient documentation

## 2014-08-22 DIAGNOSIS — E039 Hypothyroidism, unspecified: Secondary | ICD-10-CM | POA: Diagnosis not present

## 2014-08-22 DIAGNOSIS — R079 Chest pain, unspecified: Secondary | ICD-10-CM

## 2014-08-22 DIAGNOSIS — I10 Essential (primary) hypertension: Secondary | ICD-10-CM | POA: Diagnosis present

## 2014-08-22 DIAGNOSIS — M549 Dorsalgia, unspecified: Secondary | ICD-10-CM

## 2014-08-22 DIAGNOSIS — R05 Cough: Secondary | ICD-10-CM | POA: Diagnosis not present

## 2014-08-22 LAB — CBC WITH DIFFERENTIAL/PLATELET
BASOS ABS: 0 10*3/uL (ref 0.0–0.1)
Basophils Relative: 0 % (ref 0–1)
EOS ABS: 0 10*3/uL (ref 0.0–0.7)
Eosinophils Relative: 0 % (ref 0–5)
HCT: 30.7 % — ABNORMAL LOW (ref 36.0–46.0)
HEMOGLOBIN: 10 g/dL — AB (ref 12.0–15.0)
Lymphocytes Relative: 16 % (ref 12–46)
Lymphs Abs: 1.6 10*3/uL (ref 0.7–4.0)
MCH: 28 pg (ref 26.0–34.0)
MCHC: 32.6 g/dL (ref 30.0–36.0)
MCV: 86 fL (ref 78.0–100.0)
Monocytes Absolute: 0.6 10*3/uL (ref 0.1–1.0)
Monocytes Relative: 7 % (ref 3–12)
NEUTROS PCT: 77 % (ref 43–77)
Neutro Abs: 7.4 10*3/uL (ref 1.7–7.7)
Platelets: 305 10*3/uL (ref 150–400)
RBC: 3.57 MIL/uL — AB (ref 3.87–5.11)
RDW: 16.8 % — ABNORMAL HIGH (ref 11.5–15.5)
WBC: 9.7 10*3/uL (ref 4.0–10.5)

## 2014-08-22 LAB — COMPREHENSIVE METABOLIC PANEL
ALBUMIN: 3.8 g/dL (ref 3.5–5.0)
ALT: 16 U/L (ref 14–54)
ANION GAP: 7 (ref 5–15)
AST: 20 U/L (ref 15–41)
Alkaline Phosphatase: 80 U/L (ref 38–126)
BUN: 20 mg/dL (ref 6–20)
CALCIUM: 9.6 mg/dL (ref 8.9–10.3)
CO2: 27 mmol/L (ref 22–32)
Chloride: 108 mmol/L (ref 101–111)
Creatinine, Ser: 1.02 mg/dL — ABNORMAL HIGH (ref 0.44–1.00)
GFR calc Af Amer: >60 mL/min (ref >60)
GFR calc non Af Amer: 52 mL/min — ABNORMAL LOW (ref >60)
Glucose, Bld: 55 mg/dL — ABNORMAL LOW (ref 65–99)
Potassium: 4.6 mmol/L (ref 3.5–5.1)
Sodium: 142 mmol/L (ref 135–145)
Total Bilirubin: 0.3 mg/dL (ref 0.3–1.2)
Total Protein: 7.6 g/dL (ref 6.5–8.1)

## 2014-08-22 LAB — I-STAT TROPONIN, ED: Troponin i, poc: 0 ng/mL (ref 0.00–0.08)

## 2014-08-22 LAB — GLUCOSE, CAPILLARY
GLUCOSE-CAPILLARY: 50 mg/dL — AB (ref 65–99)
GLUCOSE-CAPILLARY: 132 mg/dL — AB (ref 65–99)
GLUCOSE-CAPILLARY: 166 mg/dL — AB (ref 65–99)
Glucose-Capillary: 65 mg/dL (ref 65–99)
Glucose-Capillary: 75 mg/dL (ref 65–99)
Glucose-Capillary: 117 mg/dL — ABNORMAL HIGH (ref 65–99)
Glucose-Capillary: 130 mg/dL — ABNORMAL HIGH (ref 65–99)
Glucose-Capillary: 160 mg/dL — ABNORMAL HIGH (ref 65–99)

## 2014-08-22 LAB — URINALYSIS, ROUTINE W REFLEX MICROSCOPIC
BILIRUBIN URINE: NEGATIVE
Glucose, UA: NEGATIVE mg/dL
Hgb urine dipstick: NEGATIVE
Ketones, ur: NEGATIVE mg/dL
Leukocytes, UA: NEGATIVE
Nitrite: NEGATIVE
PH: 5.5 (ref 5.0–8.0)
Protein, ur: NEGATIVE mg/dL
SPECIFIC GRAVITY, URINE: 1.013 (ref 1.005–1.030)
Urobilinogen, UA: 0.2 mg/dL (ref 0.0–1.0)

## 2014-08-22 LAB — CBG MONITORING, ED
GLUCOSE-CAPILLARY: 50 mg/dL — AB (ref 65–99)
Glucose-Capillary: 66 mg/dL (ref 65–99)

## 2014-08-22 MED ORDER — FERROUS FUMARATE 325 (106 FE) MG PO TABS
1.0000 | ORAL_TABLET | Freq: Every day | ORAL | Status: DC
  Administered 2014-08-22: 106 mg via ORAL
  Administered 2014-08-23 – 2014-08-24 (×2): 1 via ORAL
  Administered 2014-08-25 – 2014-08-26 (×2): 106 mg via ORAL
  Filled 2014-08-22 (×5): qty 1

## 2014-08-22 MED ORDER — DEXTROSE 5 % IV SOLN
Freq: Once | INTRAVENOUS | Status: AC
  Administered 2014-08-22: 12:00:00 via INTRAVENOUS

## 2014-08-22 MED ORDER — LEVOTHYROXINE SODIUM 200 MCG PO TABS
200.0000 ug | ORAL_TABLET | Freq: Every day | ORAL | Status: DC
Start: 2014-08-22 — End: 2014-08-26
  Administered 2014-08-22 – 2014-08-26 (×5): 200 ug via ORAL
  Filled 2014-08-22 (×7): qty 1

## 2014-08-22 MED ORDER — LOSARTAN POTASSIUM 50 MG PO TABS
50.0000 mg | ORAL_TABLET | Freq: Every day | ORAL | Status: DC
  Administered 2014-08-22 – 2014-08-23 (×2): 50 mg via ORAL
  Filled 2014-08-22 (×2): qty 1

## 2014-08-22 MED ORDER — KETOCONAZOLE 2 % EX CREA
1.0000 "application " | TOPICAL_CREAM | Freq: Every day | CUTANEOUS | Status: DC | PRN
  Administered 2014-08-25: 1 via TOPICAL
  Filled 2014-08-22 (×2): qty 15

## 2014-08-22 MED ORDER — FOLIC ACID 1 MG PO TABS
1.0000 mg | ORAL_TABLET | Freq: Every day | ORAL | Status: DC
  Administered 2014-08-22 – 2014-08-26 (×5): 1 mg via ORAL
  Filled 2014-08-22 (×5): qty 1

## 2014-08-22 MED ORDER — ATORVASTATIN CALCIUM 40 MG PO TABS
40.0000 mg | ORAL_TABLET | Freq: Every day | ORAL | Status: DC
  Administered 2014-08-22 – 2014-08-26 (×5): 40 mg via ORAL
  Filled 2014-08-22 (×5): qty 1

## 2014-08-22 MED ORDER — DEXTROSE 50 % IV SOLN
1.0000 | Freq: Once | INTRAVENOUS | Status: AC
  Administered 2014-08-22: 50 mL via INTRAVENOUS
  Filled 2014-08-22: qty 50

## 2014-08-22 MED ORDER — ENOXAPARIN SODIUM 40 MG/0.4ML ~~LOC~~ SOLN
40.0000 mg | SUBCUTANEOUS | Status: DC
  Administered 2014-08-22 – 2014-08-25 (×4): 40 mg via SUBCUTANEOUS
  Filled 2014-08-22 (×5): qty 0.4

## 2014-08-22 MED ORDER — PREGABALIN 75 MG PO CAPS
75.0000 mg | ORAL_CAPSULE | Freq: Two times a day (BID) | ORAL | Status: DC
  Administered 2014-08-22 – 2014-08-26 (×9): 75 mg via ORAL
  Filled 2014-08-22 (×9): qty 1

## 2014-08-22 MED ORDER — DEXTROSE-NACL 5-0.45 % IV SOLN
INTRAVENOUS | Status: DC
  Administered 2014-08-22: 1000 mL via INTRAVENOUS
  Administered 2014-08-23 – 2014-08-24 (×2): via INTRAVENOUS

## 2014-08-22 MED ORDER — ONDANSETRON HCL 4 MG PO TABS: 4.0000 mg | ORAL_TABLET | Freq: Four times a day (QID) | ORAL | Status: DC | PRN

## 2014-08-22 MED ORDER — ZOLPIDEM TARTRATE 10 MG PO TABS
5.0000 mg | ORAL_TABLET | Freq: Every evening | ORAL | Status: DC | PRN
  Administered 2014-08-23 – 2014-08-25 (×3): 5 mg via ORAL
  Filled 2014-08-22 (×3): qty 1

## 2014-08-22 MED ORDER — VITAMIN D3 25 MCG (1000 UNIT) PO TABS
1000.0000 [IU] | ORAL_TABLET | Freq: Every day | ORAL | Status: DC
  Administered 2014-08-22 – 2014-08-26 (×5): 1000 [IU] via ORAL
  Filled 2014-08-22 (×5): qty 1

## 2014-08-22 MED ORDER — ADULT MULTIVITAMIN W/MINERALS CH
1.0000 | ORAL_TABLET | Freq: Every day | ORAL | Status: DC
  Administered 2014-08-22 – 2014-08-26 (×5): 1 via ORAL
  Filled 2014-08-22 (×5): qty 1

## 2014-08-22 MED ORDER — DICLOFENAC SODIUM 1 % TD GEL
2.0000 g | Freq: Four times a day (QID) | TRANSDERMAL | Status: DC | PRN
  Administered 2014-08-22 – 2014-08-25 (×2): 2 g via TOPICAL
  Filled 2014-08-22: qty 100

## 2014-08-22 MED ORDER — ACETAMINOPHEN 325 MG PO TABS: 650.0000 mg | ORAL_TABLET | Freq: Four times a day (QID) | ORAL | Status: DC | PRN

## 2014-08-22 MED ORDER — ALLOPURINOL 300 MG PO TABS
300.0000 mg | ORAL_TABLET | Freq: Every day | ORAL | Status: DC
  Administered 2014-08-22 – 2014-08-26 (×5): 300 mg via ORAL
  Filled 2014-08-22 (×5): qty 1

## 2014-08-22 MED ORDER — ACETAMINOPHEN 650 MG RE SUPP: 650.0000 mg | Freq: Four times a day (QID) | RECTAL | Status: DC | PRN

## 2014-08-22 MED ORDER — CYCLOBENZAPRINE HCL 10 MG PO TABS
10.0000 mg | ORAL_TABLET | Freq: Three times a day (TID) | ORAL | Status: DC | PRN
  Administered 2014-08-22 – 2014-08-25 (×5): 10 mg via ORAL
  Filled 2014-08-22 (×5): qty 1

## 2014-08-22 MED ORDER — ALBUTEROL SULFATE (2.5 MG/3ML) 0.083% IN NEBU: 3.0000 mL | INHALATION_SOLUTION | Freq: Four times a day (QID) | RESPIRATORY_TRACT | Status: DC | PRN

## 2014-08-22 MED ORDER — GABAPENTIN 100 MG PO CAPS
100.0000 mg | ORAL_CAPSULE | Freq: Three times a day (TID) | ORAL | Status: DC | PRN
  Administered 2014-08-22 – 2014-08-23 (×2): 100 mg via ORAL
  Filled 2014-08-22 (×4): qty 1

## 2014-08-22 MED ORDER — ONDANSETRON HCL 4 MG/2ML IJ SOLN: 4.0000 mg | Freq: Four times a day (QID) | INTRAMUSCULAR | Status: DC | PRN

## 2014-08-22 NOTE — Progress Notes (Addendum)
Inpatient Diabetes Program Recommendations  AACE/ADA: New Consensus Statement on Inpatient Glycemic Control (2013)  Target Ranges:  Prepandial:   less than 140 mg/dL      Peak postprandial:   less than 180 mg/dL (1-2 hours)      Critically ill patients:  140 - 180 mg/dL    Results for [name], [name] (MRN [id number]) as of 08/22/2014 11:38  Ref. Range 08/22/2014 06:44 08/22/2014 07:22  Glucose-Capillary Latest Ref Range: 65-99 mg/dL 50 (L) 63    Admit with: Hypoglycemia  History: DM2, HTN, CKD  Home DM Meds: 70/30 insulin- 8 units bidwc  Current DM Orders: None.  Checking CBGs Q2 hours.     -Note patient admitted with Hypoglycemia.  Per MD H&P, patient states she used to take Metformin but was started on insulin for poor blood glucose control.  -Note also that patient has Chronic Kidney disease.  This condition may be predisposing patient to Hypoglycemia at home.  -Will speak with patient today to assess her knowledge of insulin and see if she is taking her insulin with meals since she is taking 70/30 at home.   Addendum 1300: Spoke with patient during her lunch.  Patient A&O x4 and able to recall events of last few weeks for me.  Patient stated she sees Dr. Delrae Rend (with [facility]) for blood sugar management/ insulin adjustment.  Saw Dr. Buddy Duty about 2 months ago.  No changes made to insulin regimen at that visit.  Patient stated she used to take Lantus (could not remember the dose) and then was switched to Novolog? And then was switched to 70/30 insulin for cost.  Patient told me she went for a scan for Dr. 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 Patient told me she was in a hurry that morning and only ate some peanut butter and crackers and had water to drink.  Was delayed at Dr. Harley Hallmark office and ended up having a Hypoglycemic event at the office b/c she didn't get her  mid-morning snack and her lunch was delayed.  Has not had any additional Hypoglycemic events since that MD visit until now.  Reviewed s/sxs of Hypoglycemia and Hypoglycemia numbers with patient.  Also reviewed appropriate treatment of Hypoglycemia at home.  Encouraged patient to check her CBGs whenever she experiences strange symptoms and to record all her CBGs for Dr. Buddy Duty.  Also encouraged patient to always take her 70/30 insulin with food and to carry snacks with her just in case she may have a delay at any of her appointments or social outings.  Gave patient an educational pamphlet on Hypoglycemia and asked her to place on her refrigerator.  Encouraged patient to follow-up with Dr. Buddy Duty asap after d/c.  She may need an insulin adjustment.     Will follow Wyn Quaker RN, MSN, CDE Diabetes Coordinator Inpatient Glycemic Control Team Team Pager: [phone] (8a-5p)

## 2014-08-22 NOTE — ED Notes (Signed)
Patient transported to X-ray 

## 2014-08-22 NOTE — ED Notes (Signed)
Lab delay - pt in bathroom then going to xray.

## 2014-08-22 NOTE — ED Notes (Signed)
Pt woke up feeling little nauseated and dizzy.  Pt checked her blood sugar and was 38.  Pt ate snack but still low.

## 2014-08-22 NOTE — ED Provider Notes (Addendum)
CSN: [id number]     Arrival date & time 08/22/14  [id number] History   First MD Initiated Contact with Patient 08/22/14 [phone]     Chief Complaint  Patient presents with  . Hypoglycemia     (Consider location/radiation/quality/duration/timing/severity/associated sxs/prior Treatment) HPI Comments: Patient is a 74 year old female with a history of diabetes who takes insulin 70/30 presents today to emergency room with complaint of weakness, dizziness, sweating and low blood sugar of 38 when she woke up this morning. She ate grape jelly in drink juice with only minimal improvement of her symptoms. Upon arrival to the emergency room her blood sugar was only 50 and she was given a sandwich and milk..The patient she stated she still felt a little weak and dizzy but it was improving. She says over the last 2 weeks she started to notice swelling in her legs that has resulted in 6 pound weight gain, dry cough and more episodes of feeling sweaty. Also over the last 2 days she's noticed left flank pain that's been persistent with some intermittent nausea and decreased urine.She denies any fevers, abdominal pain, rashes or shortness of breath. Over the last 2 weeks her insulin had been decreased from 9 units to 8 units before breakfast and at night.  She normally does not have any problems with hypoglycemia and states that she's been eating her normal diet and has not missed any meals. No other recent medication changes. Patient does take Orencia injections for her rheumatoid arthritis which she gets monthly and had about 2 weeks ago.    Patient is a 74 y.o. female presenting with hypoglycemia. The history is provided by the patient.  Hypoglycemia Initial blood sugar:  38 Blood sugar after intervention:  50 Severity:  Severe Onset quality:  Unable to specify Timing:  Constant Progression:  Partially resolved Chronicity:  New Diabetic status:  Controlled with insulin Current diabetic therapy:  8 units of novolin  70/30 before breakfast and before bed Time since last antidiabetic medication:  12 hours Context: not decreased oral intake, not diet changes, not ingestion and not new diabetes diagnosis   Relieved by: some improvement after eating grape jelly and drinking juice. Ineffective treatments:  None tried Associated symptoms: dizziness, sweats and weakness   Associated symptoms: no altered mental status, no shortness of breath and no vomiting   Associated symptoms comment:  Nausea Risk factors: kidney disease   Risk factors: no alcohol abuse, no drug use and no recent surgery     Past Medical History  Diagnosis Date  . Rheumatoid arthritis(714.0)   . Glaucoma   . HTN (hypertension)   . Hypothyroidism   . Diabetes mellitus   . Hypercholesteremia   . Chronic pain   . Allergic rhinitis   . Peripheral neuropathy   . Osteoarthritis   . Diverticulosis   . Hemorrhoids     internal  . PPD positive     6 months ago  . Anemia   . Sickle cell trait   . Meningioma 2008  . CKD (chronic kidney disease), stage III   . Aortic stenosis 12/22/2012    Mild 3/14-2.35 m/s peak velocity  . Hyperlipidemia 12/22/2012  . PVC (premature ventricular contraction) 12/22/2012   Past Surgical History  Procedure Laterality Date  . Joint replacement  2001    left TKR  . Fistula repair  03/2010    partial colectomy  . Intracranial surgery  11/2007    removal of meningioma  . Total thyroidectomy  1992  .  Rotator cuff repair  1999    BIlateral  . Sigmoidectomy  2012  . Abdominal hysterectomy  1978    TAH,& BSO 1  . Tonsillectomy and adenoidectomy    . Wrist sx      right   . Left heart catheterization with coronary angiogram N/A 08/08/2013    Procedure: LEFT HEART CATHETERIZATION WITH CORONARY ANGIOGRAM;  Surgeon: Peter M Martinique, MD;  Location: [facility] CATH LAB;  Service: Cardiovascular;  Laterality: N/A;   Family History  Problem Relation Age of Onset  . Hypotension Mother   . CVA Mother   . Diabetes  Mother   . Hypertension Father   . Kidney failure Father    Social History  Substance Use Topics  . Smoking status: Former Smoker    Quit date: 01/06/1962  . Smokeless tobacco: Never Used  . Alcohol Use: Yes     Comment: occasionally   OB History    No data available     Review of Systems  Constitutional: Positive for diaphoresis.  Respiratory: Negative for shortness of breath.   Gastrointestinal: Negative for vomiting.  Neurological: Positive for dizziness and weakness.  All other systems reviewed and are negative.     Allergies  Penicillins; Other; and Zocor  Home Medications   Prior to Admission medications   Medication Sig Start Date End Date Taking? Authorizing Provider  Abatacept (ORENCIA IV) Inject into the vein every 30 (thirty) days.    Yes Historical Provider, MD  allopurinol (ZYLOPRIM) 300 MG tablet Take 300 mg by mouth daily.   Yes Historical Provider, MD  atorvastatin (LIPITOR) 40 MG tablet Take 40 mg by mouth daily.   Yes Historical Provider, MD  cholecalciferol (VITAMIN D) 1000 UNITS tablet Take 1,000 Units by mouth daily.    Yes Historical Provider, MD  clobetasol cream (TEMOVATE) AB-123456789 % Apply 1 application topically daily as needed. For rash on leg 02/11/13  Yes Historical Provider, MD  cyclobenzaprine (FLEXERIL) 10 MG tablet Take 10 mg by mouth 3 (three) times daily as needed for muscle spasms.    Yes Historical Provider, MD  ferrous fumarate (HEMOCYTE - 106 MG FE) 325 (106 FE) MG TABS Take 1 tablet by mouth daily.    Yes Historical Provider, MD  folic acid (FOLVITE) 1 MG tablet Take 1 mg by mouth daily.   Yes Historical Provider, MD  levothyroxine (SYNTHROID, LEVOTHROID) 200 MCG tablet Take 200 mcg by mouth daily before breakfast.   Yes Historical Provider, MD  losartan (COZAAR) 50 MG tablet Take 50 mg by mouth daily.   Yes Historical Provider, MD  multivitamin [facility]) per tablet Take 1 tablet by mouth daily.   Yes Historical Provider, MD  NOVOLIN  70/30 RELION (70-30) 100 UNIT/ML injection Inject 8 Units into the skin 2 (two) times daily. For high blood sugar 07/27/14  Yes Historical Provider, MD  pregabalin (LYRICA) 75 MG capsule Take 75 mg by mouth 2 (two) times daily.   Yes Historical Provider, MD  PROAIR HFA 108 (90 BASE) MCG/ACT inhaler Inhale 1-2 puffs into the lungs every 6 (six) hours as needed for wheezing.  05/12/14  Yes Historical Provider, MD  zolpidem (AMBIEN) 10 MG tablet Take 10 mg by mouth at bedtime as needed for sleep.    Yes Historical Provider, MD  diclofenac sodium (VOLTAREN) 1 % GEL Apply 2 g topically 4 (four) times daily as needed (pain). Rub into affected area of foot 2 to 4 times daily 01/19/13   Bronson Ing, DPM  fluocinonide ointment (LIDEX) AB-123456789 % Apply 1 application topically 2 (two) times daily. Patient not taking: Reported on 07/28/2014 08/31/13   Bronson Ing, DPM  gabapentin (NEURONTIN) 100 MG capsule Take 100 mg by mouth 3 (three) times daily as needed (pain).     Historical Provider, MD  HYDROcodone-acetaminophen (NORCO/VICODIN) 5-325 MG per tablet Take 2 tablets by mouth every 4 (four) hours as needed. Patient not taking: Reported on 07/28/2014 03/01/14   Fransico Meadow, PA-C  ketoconazole (NIZORAL) 2 % cream Apply 1 application topically daily as needed for irritation. Use on the bottom of feet 03/02/13   Historical Provider, MD  silver sulfADIAZINE (SILVADENE) 1 % cream Apply 1 application topically daily. 04/13/13   Bronson Ing, DPM  Urea 45 % CREA Apply 1 application topically daily. Patient not taking: Reported on 08/22/2014 04/14/14   Viona Gilmore Egerton, DPM   BP 197/59 mmHg  Pulse 72  Temp(Src) 97.4 F (36.3 C) (Oral)  Resp 20  SpO2 100% Physical Exam  Constitutional: She is oriented to person, place, and time. She appears well-developed and well-nourished. No distress.  HENT:  Head: Normocephalic and atraumatic.  Mouth/Throat: Oropharynx is clear and moist.  Eyes: Conjunctivae and EOM  are normal. Pupils are equal, round, and reactive to light.  Neck: Normal range of motion. Neck supple.  Cardiovascular: Normal rate, regular rhythm and intact distal pulses.   Murmur heard.  Systolic murmur is present with a grade of 3/6  Heard best at the LSB  Pulmonary/Chest: Effort normal and breath sounds normal. No respiratory distress. She has no wheezes. She has no rales.  Abdominal: Soft. She exhibits no distension. There is no tenderness. There is CVA tenderness. There is no rebound and no guarding.  Left CVA tenderness  Musculoskeletal: Normal range of motion. She exhibits edema. She exhibits no tenderness.  2+ pitting edema in bilateral lower ext to the mid shin  Neurological: She is alert and oriented to person, place, and time.  Skin: Skin is warm and dry. No rash noted. No erythema.  Psychiatric: She has a normal mood and affect. Her behavior is normal.  Nursing note and vitals reviewed.   ED Course  Procedures (including critical care time) Labs Review Labs Reviewed  CBC WITH DIFFERENTIAL/PLATELET - Abnormal; Notable for the following:    RBC 3.57 (*)    Hemoglobin 10.0 (*)    HCT 30.7 (*)    RDW 16.8 (*)    All other components within normal limits  COMPREHENSIVE METABOLIC PANEL - Abnormal; Notable for the following:    Glucose, Bld 55 (*)    Creatinine, Ser 1.02 (*)    GFR calc non Af Amer 52 (*)    All other components within normal limits  CBG MONITORING, ED - Abnormal; Notable for the following:    Glucose-Capillary 50 (*)    All other components within normal limits  URINALYSIS, ROUTINE W REFLEX MICROSCOPIC (NOT AT [facility])  I-STAT TROPOININ, ED  CBG MONITORING, ED    Imaging Review Dg Chest 2 View  08/22/2014   CLINICAL DATA:  Hypoglycemia, cough.  EXAM: CHEST  2 VIEW  COMPARISON:  03/10/2014  FINDINGS: Linear scarring in the lingula. Right lung is clear. Heart is borderline in size. No effusions or acute bony abnormality.  IMPRESSION: No active  cardiopulmonary disease.   Electronically Signed   By: Rolm Baptise M.D.   On: 08/22/2014 09:18   I, Atina Feeley, personally reviewed and evaluated these images and lab results  as part of my medical decision-making.   EKG Interpretation   Date/Time:  Tuesday August 22 2014 07:26:35 EDT Ventricular Rate:  61 PR Interval:  153 QRS Duration: 82 QT Interval:  456 QTC Calculation: 459 R Axis:   46 Text Interpretation:  Sinus rhythm Probable anteroseptal infarct, old No  significant change since last tracing Confirmed by [facility]  MD, Loree Fee  [phone]) on 08/22/2014 7:56:13 AM      MDM   Final diagnoses:  Hypoglycemia   Patient with a history of rheumatoid arthritis on orencia injection therapy once a month, diabetes, hypertension, chronic kidney disease stage III and aortic stenosis presents today with complaints of feeling shaky and having low blood sugar. When she woke up this morning her blood sugar was 38. She drank juice and a great jelly and upon arrival here approximately 1 hour later her blood sugar was 50. She still complaining of feeling shaky and dizzy. After a sandwich and milk her blood sugar was 66. Patient states she takes insulin 70/30 units at night and in the morning. No change in her diet or eating patterns. For the last 2-3 days she's had left flank pain and decreased urine as well as a 6 pound weight gain and fluid over the last 2 weeks.  Dry cough but no congestion, fever, abdominal pain. Ongoing nausea without vomiting.  No history of kidney stones or history of frequent episodes of hypoglycemia.  Patient is well-appearing on exam and able to give a full history. Concern for hypoglycemia as a cause of underlying infection versus acute renal failure versus atypical cardiac event.  EKG is unchanged from prior. CBC, CMP, UA, troponin, chest x-ray pending patient given D50. We'll monitor closely  10:04 AM UA, troponin, CBC and CMP are all within normal limits. Chest  x-ray within normal limits. No acute finding for persistent hypoglycemia. On recheck after D50 and eating patient's blood sugar was back down to 65. She was started on a D5 drip. Will admit for further care. Unclear why patient is not clearing her insulin.  CRITICAL CARE Performed by: Blanchie Dessert Total critical care time: 30 Critical care time was exclusive of separately billable procedures and treating other patients. Critical care was necessary to treat or prevent imminent or life-threatening deterioration. Critical care was time spent personally by me on the following activities: development of treatment plan with patient and/or surrogate as well as nursing, discussions with consultants, evaluation of patient's response to treatment, examination of patient, obtaining history from patient or surrogate, ordering and performing treatments and interventions, ordering and review of laboratory studies, ordering and review of radiographic studies, pulse oximetry and re-evaluation of patient's condition.   Blanchie Dessert, MD 08/22/14 [place], MD 08/26/14 [phone]

## 2014-08-22 NOTE — H&P (Addendum)
[facility] History and Physical  [name] [id number] DOB: [birth year] DOA: 08/22/2014  Referring physician: Dr. Blanchie Dessert, EDP PCP: Lilian Coma, MD  Specialists:   Chief Complaint: Hypoglycemia  HPI: [name] is a 74 y.o. female  With a history of hypothyroidism, diabetes mellitus, hypertension that presented to the emergency department with complaints of low blood sugars. Patient is currently taking insuliln 70/30-  8 units twice a day. When she woke up this morning she had a blood sugar of 38. She tried to drink juice as well as grape jelly with only minimal improvement in her symptoms. Patient became dizzy, nauseated and had weakness. Upon arrival to the emergency department, blood sugar was noted to be 50. She was given a sandwich however her blood sugar still remained low despite measures. Patient was done D5 drip. Patient denies missing any meals. She states she used to be on metformin however her blood sugars were uncontrolled, and then she was placed on insulin. Patient used to use an insulin pen however was too expensive for her and she was placed on insulin 70/30. Patient recently stated that she has been having more leg swelling. She states she's gained 6 pounds in last couple of weeks. She department, again blood sugars were noted to be low. [facility] called for admission.  Review of Systems:  Constitutional: Mild diaphoresis. HEENT: Denies photophobia, eye pain, redness, hearing loss, ear pain, congestion, sore throat, rhinorrhea, sneezing, mouth sores, trouble swallowing, neck pain, neck stiffness and tinnitus.   Respiratory: Denies SOB, DOE, cough, chest tightness,  and wheezing.   Cardiovascular: Patient does complain of increased lower extremity swelling. Gastrointestinal: Denies nausea, vomiting, abdominal pain, diarrhea, constipation, blood in stool and abdominal distention.  Genitourinary: Denies dysuria, urgency, frequency, hematuria,  flank pain and difficulty urinating.  Musculoskeletal: Denies myalgias, back pain, joint swelling, arthralgias and gait problem.  Skin: Denies pallor, rash and wound.  Neurological: Dizziness and weakness. Hematological: Denies adenopathy. Easy bruising, personal or family bleeding history  Psychiatric/Behavioral: Denies suicidal ideation, mood changes, confusion, nervousness, sleep disturbance and agitation  Past Medical History  Diagnosis Date  . Rheumatoid arthritis(714.0)   . Glaucoma   . HTN (hypertension)   . Hypothyroidism   . Diabetes mellitus   . Hypercholesteremia   . Chronic pain   . Allergic rhinitis   . Peripheral neuropathy   . Osteoarthritis   . Diverticulosis   . Hemorrhoids     internal  . PPD positive     6 months ago  . Anemia   . Sickle cell trait   . Meningioma 2008  . CKD (chronic kidney disease), stage III   . Aortic stenosis 12/22/2012    Mild 3/14-2.35 m/s peak velocity  . Hyperlipidemia 12/22/2012  . PVC (premature ventricular contraction) 12/22/2012   Past Surgical History  Procedure Laterality Date  . Joint replacement  2001    left TKR  . Fistula repair  03/2010    partial colectomy  . Intracranial surgery  11/2007    removal of meningioma  . Total thyroidectomy  1992  . Rotator cuff repair  1999    BIlateral  . Sigmoidectomy  2012  . Abdominal hysterectomy  1978    TAH,& BSO 1  . Tonsillectomy and adenoidectomy    . Wrist sx      right   . Left heart catheterization with coronary angiogram N/A 08/08/2013    Procedure: LEFT HEART CATHETERIZATION WITH CORONARY ANGIOGRAM;  Surgeon: Peter M Martinique, MD;  Location: [place] [facility];  Service: Cardiovascular;  Laterality: N/A;   Social History:  reports that she quit smoking about 52 years ago. She has never used smokeless tobacco. She reports that she drinks alcohol. She reports that she does not use illicit drugs.  Allergies  Allergen Reactions  . Penicillins Rash  . Other     NO Blood  products. [name]- causes low blood sugar (pt states she was taking medication but cant remember what it was for.)  . Zocor [Simvastatin] Other (See Comments)    Leg cramps    Family History  Problem Relation Age of Onset  . Hypotension Mother   . CVA Mother   . Diabetes Mother   . Hypertension Father   . Kidney failure Father     Prior to Admission medications   Medication Sig Start Date End Date Taking? Authorizing Provider  Abatacept (ORENCIA IV) Inject into the vein every 30 (thirty) days.    Yes Historical Provider, MD  allopurinol (ZYLOPRIM) 300 MG tablet Take 300 mg by mouth daily.   Yes Historical Provider, MD  atorvastatin (LIPITOR) 40 MG tablet Take 40 mg by mouth daily.   Yes Historical Provider, MD  cholecalciferol (VITAMIN D) 1000 UNITS tablet Take 1,000 Units by mouth daily.    Yes Historical Provider, MD  clobetasol cream (TEMOVATE) [id number] % Apply 1 application topically daily as needed. For rash on leg 02/11/13  Yes Historical Provider, MD  cyclobenzaprine (FLEXERIL) 10 MG tablet Take 10 mg by mouth 3 (three) times daily as needed for muscle spasms.    Yes Historical Provider, MD  ferrous fumarate (HEMOCYTE - 106 MG FE) 325 (106 FE) MG TABS Take 1 tablet by mouth daily.    Yes Historical Provider, MD  folic acid (FOLVITE) 1 MG tablet Take 1 mg by mouth daily.   Yes Historical Provider, MD  levothyroxine (SYNTHROID, LEVOTHROID) 200 MCG tablet Take 200 mcg by mouth daily before breakfast.   Yes Historical Provider, MD  losartan (COZAAR) 50 MG tablet Take 50 mg by mouth daily.   Yes Historical Provider, MD  multivitamin [facility]) per tablet Take 1 tablet by mouth daily.   Yes Historical Provider, MD  NOVOLIN 70/30 RELION (70-30) 100 UNIT/ML injection Inject 8 Units into the skin 2 (two) times daily. For high blood sugar 07/27/14  Yes Historical Provider, MD  pregabalin (LYRICA) 75 MG capsule Take 75 mg by mouth 2 (two) times daily.   Yes Historical Provider, MD  PROAIR HFA  108 (90 BASE) MCG/ACT inhaler Inhale 1-2 puffs into the lungs every 6 (six) hours as needed for wheezing.  05/12/14  Yes Historical Provider, MD  zolpidem (AMBIEN) 10 MG tablet Take 10 mg by mouth at bedtime as needed for sleep.    Yes Historical Provider, MD  diclofenac sodium (VOLTAREN) 1 % GEL Apply 2 g topically 4 (four) times daily as needed (pain). Rub into affected area of foot 2 to 4 times daily 01/19/13   Bronson Ing, DPM  gabapentin (NEURONTIN) 100 MG capsule Take 100 mg by mouth 3 (three) times daily as needed (pain).     Historical Provider, MD  ketoconazole (NIZORAL) 2 % cream Apply 1 application topically daily as needed for irritation. Use on the bottom of feet 03/02/13   Historical Provider, MD  silver sulfADIAZINE (SILVADENE) 1 % cream Apply 1 application topically daily. 04/13/13   Bronson Ing, DPM   Physical Exam: Filed Vitals:   08/22/14 0641  BP: 197/59  Pulse: 72  Temp: 97.4 F (36.3 C)  Resp: 20     General: Well developed, well nourished, NAD, appears stated age  HEENT: NCAT, PERRLA, EOMI, Anicteic Sclera, mucous membranes moist.   Cardiovascular: S1 S2 auscultated, 3/6 SEM, Regular rate and rhythm.  Respiratory: Clear to auscultation bilaterally with equal chest rise  Abdomen: Soft, nontender, nondistended, + bowel sounds  Extremities: warm dry without cyanosis clubbing.  2+edema B/L  Neuro: AAOx3, cranial nerves grossly intact. Strength equal and bilateral in upper/lower ext.  Skin: Without rashes exudates or nodules  Psych: Normal affect and demeanor with intact judgement and insight  Labs on Admission:  Basic Metabolic Panel:  Recent Labs Lab 08/22/14 0823  NA 142  K 4.6  CL 108  CO2 27  GLUCOSE 55*  BUN 20  CREATININE 1.02*  CALCIUM 9.6   Liver Function Tests:  Recent Labs Lab 08/22/14 0823  AST 20  ALT 16  ALKPHOS 80  BILITOT 0.3  PROT 7.6  ALBUMIN 3.8   No results for input(s): LIPASE, AMYLASE in the last 168  hours. No results for input(s): AMMONIA in the last 168 hours. CBC:  Recent Labs Lab 08/22/14 0823  WBC 9.7  NEUTROABS 7.4  HGB 10.0*  HCT 30.7*  MCV 86.0  PLT 305   Cardiac Enzymes: No results for input(s): CKTOTAL, CKMB, CKMBINDEX, TROPONINI in the last 168 hours.  BNP (last 3 results) No results for input(s): BNP in the last 8760 hours.  ProBNP (last 3 results) No results for input(s): PROBNP in the last 8760 hours.  CBG:  Recent Labs Lab 08/22/14 0644 08/22/14 0722  GLUCAP 50* 66    Radiological Exams on Admission: Dg Chest 2 View  08/22/2014   CLINICAL DATA:  Hypoglycemia, cough.  EXAM: CHEST  2 VIEW  COMPARISON:  03/10/2014  FINDINGS: Linear scarring in the lingula. Right lung is clear. Heart is borderline in size. No effusions or acute bony abnormality.  IMPRESSION: No active cardiopulmonary disease.   Electronically Signed   By: Rolm Baptise M.D.   On: 08/22/2014 09:18    EKG: Independently reviewed. Size rhythm, rate 61   Assessment/Plan Hypoglycemia in the setting of diabetes mellitus, type II -Patient will be managed observation to medical floor -Wll place patient on D5 half-normal saline and continue to monitor CBGs every 2 hours -Diabetes coronary consulted and appreciated -Will hold insulin at this time. -Will obtain hemoglobin A1c Addendum: Spoke with Dr. Dagmar Hait regarding control.  He recommended holding insulin on discharge. If patient needed sliding scale during hospitalization, recommended 1u for glucose 200-299, 2u 300-299, etc.  Feels that the sensitive ISS would be too much for her.   Accelerated hypertension -Patient cannot take her home medication this morning -Continue losartan, and continue to monitor  Hypothyroidism -Continue Synthroid  Neuropathy -likely secondary to diabetes -Continue gabapentin, Lyrica  Chronic normocytic Anemia -Continue iron supplementation -Baseline hemoglobin appears to be 9-10, currently 9.7 -Continue  to monitor CBC  Chronic pain -Continue hydrocodone/acetaminophen, cyclobenzaprine  Hyperlipidemia -Continue Lipitor  Chronic kidney disease, stage III -Currently stable, continue to monitor BMP  DVT prophylaxis: Lovenox  Code Status: Full  Condition: Guarded  Family Communication: Husband at bedside. Admission, patients condition and plan of care including tests being ordered have been discussed with the patient and husband who indicate understanding and agree with the plan and Code Status.  Disposition Plan: Admit for observation   Time spent: 60 minutes  Amarianna Abplanalp D.O. [facility] Pager [phone]  If 7PM-7AM, please contact night-coverage [email or web address] Password [facility] 08/22/2014, 10:48 AM

## 2014-08-23 DIAGNOSIS — E162 Hypoglycemia, unspecified: Secondary | ICD-10-CM | POA: Diagnosis not present

## 2014-08-23 LAB — COMPREHENSIVE METABOLIC PANEL
ALBUMIN: 3.3 g/dL — AB (ref 3.5–5.0)
ALK PHOS: 75 U/L (ref 38–126)
ALT: 15 U/L (ref 14–54)
ANION GAP: 6 (ref 5–15)
AST: 16 U/L (ref 15–41)
BUN: 23 mg/dL — ABNORMAL HIGH (ref 6–20)
CALCIUM: 9 mg/dL (ref 8.9–10.3)
CO2: 25 mmol/L (ref 22–32)
Chloride: 108 mmol/L (ref 101–111)
Creatinine, Ser: 1.28 mg/dL — ABNORMAL HIGH (ref 0.44–1.00)
GFR calc Af Amer: 46 mL/min — ABNORMAL LOW (ref >60)
GFR calc non Af Amer: 40 mL/min — ABNORMAL LOW (ref >60)
GLUCOSE: 139 mg/dL — AB (ref 65–99)
POTASSIUM: 4.8 mmol/L (ref 3.5–5.1)
SODIUM: 139 mmol/L (ref 135–145)
Total Bilirubin: 0.4 mg/dL (ref 0.3–1.2)
Total Protein: 6.3 g/dL — ABNORMAL LOW (ref 6.5–8.1)

## 2014-08-23 LAB — GLUCOSE, CAPILLARY
GLUCOSE-CAPILLARY: 120 mg/dL — AB (ref 65–99)
GLUCOSE-CAPILLARY: 150 mg/dL — AB (ref 65–99)
Glucose-Capillary: 101 mg/dL — ABNORMAL HIGH (ref 65–99)
Glucose-Capillary: 81 mg/dL (ref 65–99)
Glucose-Capillary: 118 mg/dL — ABNORMAL HIGH (ref 65–99)

## 2014-08-23 MED ORDER — HYDRALAZINE HCL 20 MG/ML IJ SOLN: 5.0000 mg | Freq: Four times a day (QID) | INTRAMUSCULAR | Status: DC | PRN

## 2014-08-23 MED ORDER — HYDROCODONE-ACETAMINOPHEN 5-325 MG PO TABS: 1.0000 | ORAL_TABLET | ORAL | Status: DC | PRN

## 2014-08-23 MED ORDER — HYDRALAZINE HCL 10 MG PO TABS
10.0000 mg | ORAL_TABLET | Freq: Three times a day (TID) | ORAL | Status: DC
  Administered 2014-08-23 – 2014-08-26 (×9): 10 mg via ORAL
  Filled 2014-08-23 (×12): qty 1

## 2014-08-23 MED ORDER — INSULIN ASPART 100 UNIT/ML ~~LOC~~ SOLN
0.0000 [IU] | Freq: Three times a day (TID) | SUBCUTANEOUS | Status: DC
  Administered 2014-08-26: 1 [IU] via SUBCUTANEOUS

## 2014-08-23 NOTE — Evaluation (Signed)
Physical Therapy One Time Evaluation Patient Details Name: [name] MRN: [id number] DOB: Oct 04, 1940 Today's Date: 08/23/2014   History of Present Illness  74 y.o. female with hx of hypothyroidism, diabetes mellitus, hypertension, RA, neuropathy, anemia and who presented to the emergency department for evaluation of hypoglycemic events at home   Clinical Impression  Patient evaluated by Physical Therapy with no further acute PT needs identified. All education has been completed and the patient has no further questions.  Pt mobilizing well with RW.  Pt reports her current walker is in poor condition so recommend RW for home.  Pt reports a couple falls at home however usually gets nauseated and dizzy (likely due to glucose) and not using SPC.  See below for any follow-up Physial Therapy or equipment needs. PT is signing off. Thank you for this referral.     Follow Up Recommendations No PT follow up    Equipment Recommendations  Rolling walker with 5" wheels (would prefer wider normal RW if possible)    Recommendations for Other Services       Precautions / Restrictions Precautions Precautions: Fall      Mobility  Bed Mobility Overal bed mobility: Modified Independent                Transfers Overall transfer level: Needs assistance Equipment used: Rolling walker (2 wheeled) Transfers: Sit to/from Stand Sit to Stand: Supervision            Ambulation/Gait Ambulation/Gait assistance: Min guard;Supervision Ambulation Distance (Feet): 400 Feet Assistive device: Rolling walker (2 wheeled) Gait Pattern/deviations: Step-through pattern     General Gait Details: verbal cues for RW positioning, gait improved with distance  Stairs            Wheelchair Mobility    Modified Rankin (Stroke Patients Only)       Balance                                             Pertinent Vitals/Pain Pain Assessment: 0-10 Pain Score: 4  Pain  Location: L lower back with gait Pain Descriptors / Indicators: Aching Pain Intervention(s): Limited activity within patient's tolerance;Monitored during session;Repositioned    Home Living Family/patient expects to be discharged to:: Private residence Living Arrangements: Spouse/significant other   Type of Home: House       Home Layout: One level Home Equipment: Cane - single point;Walker - 2 wheels (reports walker 75 years old and in poor condition)      Prior Function Level of Independence: Independent with assistive device(s)         Comments: typically uses SPC     Hand Dominance        Extremity/Trunk Assessment               Lower Extremity Assessment: LLE deficits/detail;RLE deficits/detail   LLE Deficits / Details: L LE deficits from TKA, pt reports 2 manipulations s/p TKA, limited to approx 60* knee flexion     Communication   Communication: No difficulties  Cognition Arousal/Alertness: Awake/alert Behavior During Therapy: WFL for tasks assessed/performed Overall Cognitive Status: Within Functional Limits for tasks assessed                      General Comments      Exercises        Assessment/Plan    PT Assessment  Patent does not need any further PT services  PT Diagnosis Difficulty walking   PT Problem List    PT Treatment Interventions     PT Goals (Current goals can be found in the Care Plan section) Acute Rehab PT Goals PT Goal Formulation: All assessment and education complete, DC therapy    Frequency     Barriers to discharge        Co-evaluation               End of Session Equipment Utilized During Treatment: Gait belt Activity Tolerance: Patient tolerated treatment well Patient left: in bed;with call bell/phone within reach;with bed alarm set      Functional Assessment Tool Used: clinical judgement Functional Limitation: Mobility: Walking and moving around Mobility: Walking and Moving Around Current  Status [id number]): At least 1 percent but less than 20 percent impaired, limited or restricted Mobility: Walking and Moving Around Goal Status [phone]): At least 1 percent but less than 20 percent impaired, limited or restricted Mobility: Walking and Moving Around Discharge Status [phone]): At least 1 percent but less than 20 percent impaired, limited or restricted    Time: 1351-1421 PT Time Calculation (min) (ACUTE ONLY): 30 min   Charges:   PT Evaluation $Initial PT Evaluation Tier I: 1 Procedure     PT G Codes:   PT G-Codes **NOT FOR INPATIENT CLASS** Functional Assessment Tool Used: clinical judgement Functional Limitation: Mobility: Walking and moving around Mobility: Walking and Moving Around Current Status [id number]): At least 1 percent but less than 20 percent impaired, limited or restricted Mobility: Walking and Moving Around Goal Status [phone]): At least 1 percent but less than 20 percent impaired, limited or restricted Mobility: Walking and Moving Around Discharge Status [phone]): At least 1 percent but less than 20 percent impaired, limited or restricted    Audre Cenci,KATHrine E 08/23/2014, 3:58 PM Carmelia Bake, PT, DPT 08/23/2014 Pager: [phone]

## 2014-08-23 NOTE — Progress Notes (Signed)
Patient ID: [name], female   DOB: 1940/05/19, 74 y.o.   MRN: [id number]  [facility] PROGRESS NOTE  KASHLYNN NAZZAL [id number] DOB: 27-Sep-1940 DOA: 08/22/2014 PCP: Lilian Coma, MD   Brief narrative:    74 y.o. female with hypothyroidism, diabetes mellitus, hypertension, who presented to the emergency department for evaluation of hypoglycemic events at home with CBG's in 30's. Patient is taking insuliln 70/30 - 8 units twice a day. This has been associated with clammy skin, dizziness, fatigue.   Assessment/Plan:    Principal Problem:   Hypoglycemia - unclear what the triggering even is  - pt is on low dose insulin at home - this has been held here - will consult diabetic educator - continue with SSI   Active Problems:   Acute renal failure - appears to be pre renal in etiology - will hold losartan for now - encouraged PO intake - repeat BMP in AM    Hyperlipidemia - continue statin     HTN (hypertension) - on losartan at home but will hold for now until renal function stabilizes - will place on hydralazine scheduled and as needed to see if this will help stabilize BP     DM type II with complications of hypoglycemic events and neuropathy  - holding home regimen    Neuropathy - continue Neurontin     Hypothyroidism - continue synthroid     Chronic pain - continue analgesia as needed     Obesity, morbid - pt meets criteria with BMI > 35 and risk factors of HTN, HLD - Body mass index is 36.37 kg/(m^2).  DVT prophylaxis - Lovenox SQ  Code Status: Full.  Family Communication:  plan of care discussed with the patient Disposition Plan: Home when stable.   IV access:  Peripheral IV  Procedures and diagnostic studies:    Dg Chest 2 View 08/22/2014  No active cardiopulmonary disease.     Medical Consultants:  None  Other Consultants:  PT  IAnti-Infectives:   None  Faye Ramsay, MD  [place] Pager [phone]  If 7PM-7AM,  please contact night-coverage [email or web address] Password [facility] 08/23/2014, 2:24 PM  HPI/Subjective: No events overnight.   Objective: Filed Vitals:   08/22/14 1350 08/22/14 2220 08/23/14 0522 08/23/14 1335  BP: 147/56 134/55 134/56 161/58  Pulse: 59 54 53 58  Temp: 98 F (36.7 C) 98.2 F (36.8 C) 97.9 F (36.6 C) 97.9 F (36.6 C)  TempSrc: Oral Oral Oral Oral  Resp: 16 16 16 18   Height:      Weight:      SpO2: 100% 100% 100% 100%    Intake/Output Summary (Last 24 hours) at 08/23/14 1424 Last data filed at 08/23/14 1248  Gross per 24 hour  Intake   1310 ml  Output   2150 ml  Net   -840 ml    Exam:   General:  Pt is alert, follows commands appropriately, not in acute distress  Cardiovascular: Regular rate and rhythm, no rubs, no gallops  Respiratory: Clear to auscultation bilaterally, no wheezing, no crackles, no rhonchi  Abdomen: Soft, non tender, non distended, bowel sounds present, no guarding  Extremities: , pulses DP and PT palpable bilaterally  Neuro: Grossly nonfocal  Data Reviewed: Basic Metabolic Panel:  Recent Labs Lab 08/22/14 0823 08/23/14 0433  NA 142 139  K 4.6 4.8  CL 108 108  CO2 27 25  GLUCOSE 55* 139*  BUN 20 23*  CREATININE 1.02* 1.28*  CALCIUM 9.6 9.0  Liver Function Tests:  Recent Labs Lab 08/22/14 0823 08/23/14 0433  AST 20 16  ALT 16 15  ALKPHOS 80 75  BILITOT 0.3 0.4  PROT 7.6 6.3*  ALBUMIN 3.8 3.3*   CBC:  Recent Labs Lab 08/22/14 0823  WBC 9.7  NEUTROABS 7.4  HGB 10.0*  HCT 30.7*  MCV 86.0  PLT 305   CBG:  Recent Labs Lab 08/22/14 1957 08/22/14 2211 08/23/14 0455 08/23/14 0801 08/23/14 1153  GLUCAP 160* 166* 120* 101* 81    No results found for this or any previous visit (from the past 240 hour(s)).   Scheduled Meds: . allopurinol  300 mg Oral Daily  . atorvastatin  40 mg Oral Daily  . cholecalciferol  1,000 Units Oral Daily  . enoxaparin (LOVENOX) injection  40 mg Subcutaneous Q24H  .  ferrous fumarate  1 tablet Oral Daily  . folic acid  1 mg Oral Daily  . levothyroxine  200 mcg Oral QAC breakfast  . losartan  50 mg Oral Daily  . multivitamin with minerals  1 tablet Oral Daily  . pregabalin  75 mg Oral BID   Continuous Infusions: . dextrose 5 % and 0.45% NaCl 1,000 mL (08/22/14 1234)

## 2014-08-23 NOTE — Evaluation (Signed)
Occupational Therapy Evaluation Patient Details Name: [name] MRN: [id number] DOB: 01-02-1941 Today's Date: 08/23/2014    History of Present Illness 74 y.o. female with hx of hypothyroidism, diabetes mellitus, hypertension, RA, neuropathy, anemia and who presented to the emergency department for evaluation of hypoglycemic events at home    Clinical Impression   Pt was admitted for the above.  Educated on AE and DME as she has been struggling at home.  No further OT needs at this time    Follow Up Recommendations  No OT follow up;Supervision/Assistance - 24 hour    Equipment Recommendations  3 in 1 bedside comode    Recommendations for Other Services       Precautions / Restrictions Precautions Precautions: Fall Restrictions Weight Bearing Restrictions: No      Mobility Bed Mobility Overal bed mobility: Needs Assistance Bed Mobility: Sit to Supine;Supine to Sit     Supine to sit: Modified independent (Device/Increase time) Sit to supine: Min assist   General bed mobility comments: assist for legs for back to bed  Transfers Overall transfer level: Needs assistance Equipment used: Rolling walker (2 wheeled) Transfers: Sit to/from Stand Sit to Stand: Supervision              Balance                                            ADL Overall ADL's : Needs assistance/impaired             Lower Body Bathing: Minimal assistance;+2 for safety/equipment;Sit to/from stand       Lower Body Dressing: Min guard;Sit to/from stand;With adaptive equipment   Toilet Transfer: Supervision/safety;Ambulation;BSC             General ADL Comments: washed legs and donned/doffed socks with reacher and sock aide.  Showed AE kit.  Pt wants 3:1 for home.  She has difficulty getting off toilet at home with toilet riser. She has tub seat and must step over tub--has fallen. Showed picture of tub bench which would be safer option     Vision      Perception     Praxis      Pertinent Vitals/Pain Pain Assessment: 0-10 Pain Score: 6  Pain Location: neck and RLE Pain Descriptors / Indicators: Spasm Pain Intervention(s): Limited activity within patient's tolerance;Monitored during session;Premedicated before session;Patient requesting pain meds-RN notified;Repositioned     Hand Dominance     Extremity/Trunk Assessment Upper Extremity Assessment Upper Extremity Assessment: Overall WFL for tasks assessed          Communication Communication Communication: No difficulties   Cognition Arousal/Alertness: Awake/alert Behavior During Therapy: WFL for tasks assessed/performed Overall Cognitive Status: Within Functional Limits for tasks assessed                     General Comments       Exercises       Shoulder Instructions      Home Living Family/patient expects to be discharged to:: Private residence Living Arrangements: Spouse/significant other Available Help at Discharge: Family Type of Home: House       Home Layout: One level     Bathroom Shower/Tub: Occupational psychologist: Standard     Home Equipment: Toilet riser;Shower seat          Prior Functioning/Environment Level of Independence: Independent with  assistive device(s)        Comments: typically uses SPC    OT Diagnosis: Generalized weakness;Acute pain   OT Problem List:     OT Treatment/Interventions:      OT Goals(Current goals can be found in the care plan section)    OT Frequency:     Barriers to D/C:            Co-evaluation              End of Session    Activity Tolerance: Patient tolerated treatment well Patient left: in bed;with call bell/phone within reach;with bed alarm set;with family/visitor present   Time: 0123-9359 OT Time Calculation (min): 49 min Charges:  OT General Charges $OT Visit: 1 Procedure OT Evaluation $Initial OT Evaluation Tier I: 1 Procedure OT Treatments $Self  Care/Home Management : 8-22 mins G-Codes: OT G-codes **NOT FOR INPATIENT CLASS** Functional Assessment Tool Used: clinical observation Functional Limitation: Self care Self Care Current Status (A0905): At least 1 percent but less than 20 percent impaired, limited or restricted Self Care Goal Status (W2561): At least 1 percent but less than 20 percent impaired, limited or restricted Self Care Discharge Status [phone]): At least 1 percent but less than 20 percent impaired, limited or restricted  [facility] 08/23/2014, 4:37 PM  Lesle Chris, OTR/L [phone] 08/23/2014

## 2014-08-24 ENCOUNTER — Observation Stay (HOSPITAL_COMMUNITY): Payer: Medicare Other

## 2014-08-24 DIAGNOSIS — M5126 Other intervertebral disc displacement, lumbar region: Secondary | ICD-10-CM | POA: Diagnosis not present

## 2014-08-24 DIAGNOSIS — E162 Hypoglycemia, unspecified: Secondary | ICD-10-CM | POA: Diagnosis not present

## 2014-08-24 LAB — GLUCOSE, CAPILLARY
GLUCOSE-CAPILLARY: 135 mg/dL — AB (ref 65–99)
Glucose-Capillary: 108 mg/dL — ABNORMAL HIGH (ref 65–99)
Glucose-Capillary: 105 mg/dL — ABNORMAL HIGH (ref 65–99)
Glucose-Capillary: 126 mg/dL — ABNORMAL HIGH (ref 65–99)

## 2014-08-24 LAB — BASIC METABOLIC PANEL
ANION GAP: 7 (ref 5–15)
BUN: 32 mg/dL — ABNORMAL HIGH (ref 6–20)
CHLORIDE: 108 mmol/L (ref 101–111)
CO2: 24 mmol/L (ref 22–32)
Calcium: 9.3 mg/dL (ref 8.9–10.3)
Creatinine, Ser: 1.22 mg/dL — ABNORMAL HIGH (ref 0.44–1.00)
GFR calc non Af Amer: 42 mL/min — ABNORMAL LOW (ref >60)
GFR, EST AFRICAN AMERICAN: 49 mL/min — AB (ref >60)
Glucose, Bld: 132 mg/dL — ABNORMAL HIGH (ref 65–99)
Potassium: 4.8 mmol/L (ref 3.5–5.1)
Sodium: 139 mmol/L (ref 135–145)

## 2014-08-24 LAB — CBC
HEMATOCRIT: 29.4 % — AB (ref 36.0–46.0)
HEMOGLOBIN: 9.4 g/dL — AB (ref 12.0–15.0)
MCH: 27.5 pg (ref 26.0–34.0)
MCHC: 32 g/dL (ref 30.0–36.0)
MCV: 86 fL (ref 78.0–100.0)
Platelets: 249 10*3/uL (ref 150–400)
RBC: 3.42 MIL/uL — AB (ref 3.87–5.11)
RDW: 16.6 % — ABNORMAL HIGH (ref 11.5–15.5)
WBC: 7.2 10*3/uL (ref 4.0–10.5)

## 2014-08-24 LAB — HEMOGLOBIN A1C
Hgb A1c MFr Bld: 6.7 % — ABNORMAL HIGH (ref 4.8–5.6)
Mean Plasma Glucose: 146 mg/dL

## 2014-08-24 MED ORDER — SODIUM CHLORIDE 0.9 % IV SOLN
INTRAVENOUS | Status: DC
  Administered 2014-08-24: 21:00:00 via INTRAVENOUS

## 2014-08-24 MED ORDER — HYDROCODONE-ACETAMINOPHEN 5-325 MG PO TABS
1.0000 | ORAL_TABLET | ORAL | Status: DC | PRN
  Administered 2014-08-24 – 2014-08-25 (×4): 2 via ORAL
  Filled 2014-08-24 (×4): qty 2

## 2014-08-24 NOTE — Progress Notes (Signed)
Patient ID: [name], female   DOB: 1940-06-30, 74 y.o.   MRN: [id number]  [facility] PROGRESS NOTE  [name] [id number] DOB: [birth year] DOA: 08/22/2014 PCP: Lilian Coma, MD   Brief narrative:    74 y.o. female with hypothyroidism, diabetes mellitus, hypertension, who presented to the emergency department for evaluation of hypoglycemic events at home with CBG's in 30's. Patient is taking insuliln 70/30 - 8 units twice a day. This has been associated with clammy skin, dizziness, fatigue.   Assessment/Plan:    Principal Problem:   Hypoglycemia - unclear what the triggering event is  - pt is on low dose insulin at home - appreciate diabetic educator assistance - will try to have an appointment scheduled with Dr. 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  DVT prophylaxis - Lovenox SQ  Code Status: Full.  Family Communication:  plan of care discussed with the patient Disposition Plan: Home in 1-2 days   IV access:  Peripheral IV  Procedures  and diagnostic studies:    Dg Chest 2 View 08/22/2014  No active cardiopulmonary disease.     Medical Consultants:  None  Other Consultants:  PT - recommend rolling walker, order placed  OT - recommend 3:1 bedside commode, order placed  Diabetic educator   IAnti-Infectives:   None  Faye Ramsay, MD  [place] Pager [phone]  If 7PM-7AM, please contact night-coverage [email or web address] Password [facility] 08/24/2014, 9:04 AM  HPI/Subjective: No events overnight. Still with low back pain 5/10 in severity.   Objective: Filed Vitals:   08/23/14 0522 08/23/14 1335 08/23/14 2001 08/24/14 0449  BP: 134/56 161/58 139/46 155/62  Pulse: 53 58 65 51  Temp: 97.9 F (36.6 C) 97.9 F (36.6 C) 98.4 F (36.9 C) 98.1 F (36.7 C)  TempSrc: Oral Oral Oral Oral  Resp: 16 18 16 16   Height:      Weight:      SpO2: 100% 100% 99% 99%    Intake/Output Summary (Last 24 hours) at 08/24/14 [id number] Last data filed at 08/24/14 0449  Gross per 24 hour  Intake   1225 ml  Output   2200 ml  Net   -975 ml    Exam:   General:  Pt is alert, follows commands appropriately, not in acute distress  Cardiovascular: Regular rate and rhythm, no rubs, no gallops  Respiratory: Clear to auscultation bilaterally, no wheezing,  no crackles, no rhonchi  Abdomen: Soft, non tender, non distended, bowel sounds present, no guarding  Extremities: , pulses DP and PT palpable bilaterally  Neuro: Grossly nonfocal  Data Reviewed: Basic Metabolic Panel:  Recent Labs Lab 08/22/14 0823 08/23/14 0433 08/24/14 0425  NA 142 139 139  K 4.6 4.8 4.8  CL 108 108 108  CO2 27 25 24   GLUCOSE 55* 139* 132*  BUN 20 23* 32*  CREATININE 1.02* 1.28* 1.22*  CALCIUM 9.6 9.0 9.3   Liver Function Tests:  Recent Labs Lab 08/22/14 0823 08/23/14 0433  AST 20 16  ALT 16 15  ALKPHOS 80 75  BILITOT 0.3 0.4  PROT 7.6 6.3*  ALBUMIN 3.8 3.3*   CBC:  Recent Labs Lab 08/22/14 0823 08/24/14 0425  WBC 9.7 7.2  NEUTROABS  7.4  --   HGB 10.0* 9.4*  HCT 30.7* 29.4*  MCV 86.0 86.0  PLT 305 249   CBG:  Recent Labs Lab 08/23/14 1153 08/23/14 1606 08/23/14 2001 08/24/14 08/24/14 0723  GLUCAP 81 118* 150* 135* 108*    Scheduled Meds: . allopurinol  300 mg Oral Daily  . atorvastatin  40 mg Oral Daily  . cholecalciferol  1,000 Units Oral Daily  . enoxaparin (LOVENOX) injection  40 mg Subcutaneous Q24H  . ferrous fumarate  1 tablet Oral Daily  . folic acid  1 mg Oral Daily  . hydrALAZINE  10 mg Oral 3 times per day  . insulin aspart  0-9 Units Subcutaneous TID WC  . levothyroxine  200 mcg Oral QAC breakfast  . multivitamin with minerals  1 tablet Oral Daily  . pregabalin  75 mg Oral BID   Continuous Infusions: . dextrose 5 % and 0.45% NaCl 75 mL/hr at 08/24/14 0434

## 2014-08-24 NOTE — Progress Notes (Signed)
Inpatient Diabetes Program Recommendations  AACE/ADA: New Consensus Statement on Inpatient Glycemic Control (2013)  Target Ranges:  Prepandial:   less than 140 mg/dL      Peak postprandial:   less than 180 mg/dL (1-2 hours)      Critically ill patients:  140 - 180 mg/dL    Results for ROMUNDA, SCHREIB (MRN [id number]) as of 08/24/2014 09:53  Ref. Range 08/23/2014 08:01 08/23/2014 11:53 08/23/2014 16:06 08/23/2014 20:01  Glucose-Capillary Latest Ref Range: 65-99 mg/dL 101 (H) 81 118 (H) 150 (H)    Admit with: Hypoglycemia  History: DM2, HTN, CKD  Home DM Meds: 70/30 insulin- 8 units bidwc  Current DM Orders: Novolog Sensitive SSI (0-9 units) TID AC     Per Dr. Aldine Contes H&P note from 08/22/14: "Addendum: Spoke with Dr. Dagmar Hait regarding control. He recommended holding insulin on discharge. If patient needed sliding scale during hospitalization, recommended 1u for glucose 200-299, 2u 300-299, etc. Feels that the sensitive ISS would be too much for her."  Patient has not required any Novolog SSI since she was admitted.  Please have patient follow up with Dr. Buddy Duty asap after discharge.  At time of d/c, please d/c patient on the following SSI regimen only TID AC and please stop 70/30 insulin:  CBG 200-250 mg/dl- 1 unit Novolog CBG 251-300 mg/dl- 2 units Novolog CBG 301-350 mg/dl- 3 units Novolog CBG 351-400 mg/dl- 4 units Novolog     Will follow Wyn Quaker RN, MSN, CDE Diabetes Coordinator Inpatient Glycemic Control Team Team Pager: [phone] (8a-5p)

## 2014-08-25 ENCOUNTER — Observation Stay (HOSPITAL_COMMUNITY): Payer: Medicare Other

## 2014-08-25 ENCOUNTER — Encounter (HOSPITAL_COMMUNITY): Payer: Medicare Other

## 2014-08-25 DIAGNOSIS — M069 Rheumatoid arthritis, unspecified: Secondary | ICD-10-CM | POA: Diagnosis not present

## 2014-08-25 DIAGNOSIS — E780 Pure hypercholesterolemia: Secondary | ICD-10-CM | POA: Diagnosis not present

## 2014-08-25 DIAGNOSIS — R918 Other nonspecific abnormal finding of lung field: Secondary | ICD-10-CM | POA: Diagnosis not present

## 2014-08-25 DIAGNOSIS — E11649 Type 2 diabetes mellitus with hypoglycemia without coma: Secondary | ICD-10-CM | POA: Diagnosis not present

## 2014-08-25 DIAGNOSIS — E162 Hypoglycemia, unspecified: Secondary | ICD-10-CM | POA: Diagnosis not present

## 2014-08-25 DIAGNOSIS — E039 Hypothyroidism, unspecified: Secondary | ICD-10-CM | POA: Diagnosis not present

## 2014-08-25 DIAGNOSIS — J9811 Atelectasis: Secondary | ICD-10-CM | POA: Diagnosis not present

## 2014-08-25 LAB — CBC
HEMATOCRIT: 29.3 % — AB (ref 36.0–46.0)
HEMOGLOBIN: 9.3 g/dL — AB (ref 12.0–15.0)
MCH: 27.6 pg (ref 26.0–34.0)
MCHC: 31.7 g/dL (ref 30.0–36.0)
MCV: 86.9 fL (ref 78.0–100.0)
Platelets: 250 10*3/uL (ref 150–400)
RBC: 3.37 MIL/uL — ABNORMAL LOW (ref 3.87–5.11)
RDW: 16.6 % — ABNORMAL HIGH (ref 11.5–15.5)
WBC: 8 10*3/uL (ref 4.0–10.5)

## 2014-08-25 LAB — BASIC METABOLIC PANEL
Anion gap: 8 (ref 5–15)
BUN: 37 mg/dL — ABNORMAL HIGH (ref 6–20)
CHLORIDE: 108 mmol/L (ref 101–111)
CO2: 21 mmol/L — AB (ref 22–32)
CREATININE: 1.33 mg/dL — AB (ref 0.44–1.00)
Calcium: 9.1 mg/dL (ref 8.9–10.3)
GFR calc non Af Amer: 38 mL/min — ABNORMAL LOW (ref >60)
GFR, EST AFRICAN AMERICAN: 44 mL/min — AB (ref >60)
GLUCOSE: 124 mg/dL — AB (ref 65–99)
Potassium: 5 mmol/L (ref 3.5–5.1)
Sodium: 137 mmol/L (ref 135–145)

## 2014-08-25 LAB — GLUCOSE, CAPILLARY
GLUCOSE-CAPILLARY: 89 mg/dL (ref 65–99)
GLUCOSE-CAPILLARY: 104 mg/dL — AB (ref 65–99)
GLUCOSE-CAPILLARY: 183 mg/dL — AB (ref 65–99)
Glucose-Capillary: 110 mg/dL — ABNORMAL HIGH (ref 65–99)
Glucose-Capillary: 98 mg/dL (ref 65–99)
Glucose-Capillary: 123 mg/dL — ABNORMAL HIGH (ref 65–99)

## 2014-08-25 LAB — TROPONIN I

## 2014-08-25 NOTE — Care Management Note (Signed)
Case Management Note  Patient Details  Name: [name] MRN: [id number] Date of Birth: 11-Sep-1940  Subjective/Objective:               74 yo admitted with Hypoglycemia     Action/Plan: From home with spouse  Expected Discharge Date:   (unknown)               Expected Discharge Plan:  Home/Self Care  In-House Referral:     Discharge planning Services  CM Consult  Post Acute Care Choice:  Durable Medical Equipment Choice offered to:  Patient  DME Arranged:  3-N-1, Walker rolling DME Agency:  [place]:    [place] Agency:     Status of Service:  Completed, signed off  Medicare Important Message Given:    Date Medicare IM Given:    Medicare IM give by:    Date Additional Medicare IM Given:    Additional Medicare Important Message give by:     If discussed at [place] of Stay Meetings, dates discussed:    Additional Comments: Order for bedside comode and rolling walker. [facility] received orders and delivered equipment. Lynnell Catalan, RN 08/25/2014, 1:24 PM

## 2014-08-25 NOTE — Progress Notes (Signed)
Patient ID: [name], female   DOB: May 05, 1940, 74 y.o.   MRN: [id number]  [facility] PROGRESS NOTE  [name] [id number] DOB: Jun 07, 1940 DOA: 08/22/2014 PCP: Lilian Coma, MD   Brief narrative:    74 y.o. female with hypothyroidism, diabetes mellitus, hypertension, who presented to the emergency department for evaluation of hypoglycemic events at home with CBG's in 30's. Patient is taking insuliln 70/30 - 8 units twice a day. This has been associated with clammy skin, dizziness, fatigue.   Assessment/Plan:    Principal Problem:   Hypoglycemia - unclear what the triggering event is  - pt is on low dose insulin at home - appreciate diabetic educator assistance - A1C is 6.7, ? 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 Oral regimen and d/c insulin, will also need to discuss with Dr. Buddy Duty    Chest pain - this AM under the left breast and worse with deep breathing  - CT chest requested as pt has history of brain cancer - 12 lead EKG, troponin requested as well     Anemia of chronic disease, IDA - continue iron tablets     Neuropathy - continue Neurontin     Hypothyroidism - continue synthroid     Chronic pain, low back  - continue analgesia as needed  - MRI lumbar spine with no acute findings but has chronic degenerative changes - will need PT    Obesity,  morbid - pt meets criteria with BMI > 35 and risk factors of HTN, HLD - Body mass index is 36.37 kg/(m^2).  DVT prophylaxis - Lovenox SQ  Code Status: Full.  Family Communication:  plan of care discussed with the patient Disposition Plan: Home in 1-2 days if Cr continues trending down   IV access:  Peripheral IV  Procedures and diagnostic studies:    Dg Chest 2 View 08/22/2014  No active cardiopulmonary disease.     Medical Consultants:  None  Other Consultants:  PT - recommend rolling walker, order placed  OT - recommend 3:1 bedside commode, order placed  Diabetic educator   IAnti-Infectives:   None  Faye Ramsay, MD  [place] Pager [phone]  If 7PM-7AM, please contact night-coverage [email or web address] Password [email or web address] 08/25/2014, 12:08 PM  HPI/Subjective: No events overnight. Still with low back pain 5/10 in severity. Also new chest pain   Objective: Filed Vitals:   08/24/14 0449 08/24/14 1452 08/24/14 2148 08/25/14 0539  BP: 155/62 133/42 140/59 137/51  Pulse: 51 60 58 56  Temp: 98.1 F (36.7 C) 97.8 F (36.6 C) 97.5 F (36.4 C) 98 F (36.7 C)  TempSrc: Oral Oral Oral Oral  Resp: 16 18 18 18   Height:      Weight:      SpO2: 99% 98% 100% 100%    Intake/Output  Summary (Last 24 hours) at 08/25/14 1208 Last data filed at 08/25/14 0600  Gross per 24 hour  Intake    930 ml  Output   1100 ml  Net   -170 ml    Exam:   General:  Pt is alert, follows commands appropriately, not in acute distress  Cardiovascular: Regular rate and rhythm, no rubs, no gallops  Respiratory: Clear to auscultation bilaterally, no wheezing, no crackles, no rhonchi  Abdomen: Soft, non tender, non distended, bowel sounds present, no guarding  Extremities: , pulses DP and PT palpable bilaterally  Neuro: Grossly nonfocal  Data Reviewed: Basic Metabolic Panel:  Recent Labs Lab 08/22/14 0823 08/23/14 0433 08/24/14 0425 08/25/14 0453  NA 142 139 139 137  K 4.6 4.8 4.8 5.0   CL 108 108 108 108  CO2 27 25 24  21*  GLUCOSE 55* 139* 132* 124*  BUN 20 23* 32* 37*  CREATININE 1.02* 1.28* 1.22* 1.33*  CALCIUM 9.6 9.0 9.3 9.1   Liver Function Tests:  Recent Labs Lab 08/22/14 0823 08/23/14 0433  AST 20 16  ALT 16 15  ALKPHOS 80 75  BILITOT 0.3 0.4  PROT 7.6 6.3*  ALBUMIN 3.8 3.3*   CBC:  Recent Labs Lab 08/22/14 0823 08/24/14 0425 08/25/14 0453  WBC 9.7 7.2 8.0  NEUTROABS 7.4  --   --   HGB 10.0* 9.4* 9.3*  HCT 30.7* 29.4* 29.3*  MCV 86.0 86.0 86.9  PLT 305 249 250   CBG:  Recent Labs Lab 08/24/14 1216 08/24/14 1646 08/24/14 1732 08/24/14 2153 08/25/14 0732  GLUCAP 89 105* 104* 126* 110*    Scheduled Meds: . allopurinol  300 mg Oral Daily  . atorvastatin  40 mg Oral Daily  . cholecalciferol  1,000 Units Oral Daily  . enoxaparin (LOVENOX) injection  40 mg Subcutaneous Q24H  . ferrous fumarate  1 tablet Oral Daily  . folic acid  1 mg Oral Daily  . hydrALAZINE  10 mg Oral 3 times per day  . insulin aspart  0-9 Units Subcutaneous TID WC  . levothyroxine  200 mcg Oral QAC breakfast  . multivitamin with minerals  1 tablet Oral Daily  . pregabalin  75 mg Oral BID   Continuous Infusions:

## 2014-08-26 DIAGNOSIS — E162 Hypoglycemia, unspecified: Secondary | ICD-10-CM | POA: Diagnosis not present

## 2014-08-26 LAB — BASIC METABOLIC PANEL
ANION GAP: 7 (ref 5–15)
BUN: 37 mg/dL — ABNORMAL HIGH (ref 6–20)
CO2: 22 mmol/L (ref 22–32)
Calcium: 9 mg/dL (ref 8.9–10.3)
Chloride: 107 mmol/L (ref 101–111)
Creatinine, Ser: 1.46 mg/dL — ABNORMAL HIGH (ref 0.44–1.00)
GFR calc Af Amer: 39 mL/min — ABNORMAL LOW (ref >60)
GFR, EST NON AFRICAN AMERICAN: 34 mL/min — AB (ref >60)
GLUCOSE: 150 mg/dL — AB (ref 65–99)
POTASSIUM: 4.3 mmol/L (ref 3.5–5.1)
Sodium: 136 mmol/L (ref 135–145)

## 2014-08-26 LAB — CBC
HEMATOCRIT: 29.7 % — AB (ref 36.0–46.0)
Hemoglobin: 9.3 g/dL — ABNORMAL LOW (ref 12.0–15.0)
MCH: 26.8 pg (ref 26.0–34.0)
MCHC: 31.3 g/dL (ref 30.0–36.0)
MCV: 85.6 fL (ref 78.0–100.0)
Platelets: 255 10*3/uL (ref 150–400)
RBC: 3.47 MIL/uL — AB (ref 3.87–5.11)
RDW: 16.3 % — ABNORMAL HIGH (ref 11.5–15.5)
WBC: 8.1 10*3/uL (ref 4.0–10.5)

## 2014-08-26 LAB — GLUCOSE, CAPILLARY
GLUCOSE-CAPILLARY: 132 mg/dL — AB (ref 65–99)
Glucose-Capillary: 104 mg/dL — ABNORMAL HIGH (ref 65–99)

## 2014-08-26 MED ORDER — HYDROCODONE-ACETAMINOPHEN 5-325 MG PO TABS: 1.0000 | ORAL_TABLET | ORAL | Status: DC | PRN

## 2014-08-26 MED ORDER — NOVOLIN 70/30 RELION (70-30) 100 UNIT/ML ~~LOC~~ SUSP: 2.0000 [IU] | Freq: Two times a day (BID) | SUBCUTANEOUS | Status: DC

## 2014-08-26 MED ORDER — HYDRALAZINE HCL 10 MG PO TABS: 10.0000 mg | ORAL_TABLET | Freq: Three times a day (TID) | ORAL | Status: DC

## 2014-08-26 NOTE — Discharge Instructions (Signed)

## 2014-08-26 NOTE — Progress Notes (Signed)
Patient discharged to home, all discharge medications and instructions reviewed and questions answered.  Patient to be assisted to vehicle by wheelchair.  

## 2014-08-26 NOTE — Discharge Summary (Signed)
Physician Discharge Summary  [name] [id number] DOB: 22-Oct-1940 DOA: 08/22/2014  PCP: Lilian Coma, MD  Admit date: 08/22/2014 Discharge date: 08/26/2014  Recommendations for Outpatient Follow-up:  1. Pt will need to follow up with PCP in 2-3 weeks post discharge 2. Please obtain BMP to evaluate electrolytes and kidney function 3. Please also check CBC to evaluate Hg and Hct levels 4. Please note that pt was told to stop taking Losartan until renal function stabilizes 5. Pt was started on Hydralazine instead for BP control 6. Please note that CT cehst results are below, discussed with pt need for close follow up  7. Please note that dose of Insulin was lowered due to hypoglycemia on 8 U BID   Discharge Diagnoses:  Principal Problem:   Hypoglycemia Active Problems:   Hyperlipidemia   HTN (hypertension)   Neuropathy   Hypothyroidism   Chronic pain   Discharge Condition: Stable  Diet recommendation: Heart healthy diet discussed in details   History of present illness:    Brief narrative:    74 y.o. female with hypothyroidism, diabetes mellitus, hypertension, who presented to the emergency department for evaluation of hypoglycemic events at home with CBG's in 30's. Patient is taking insuliln 70/30 - 8 units twice a day. This has been associated with clammy skin, dizziness, fatigue.   Assessment/Plan:    Principal Problem:  Hypoglycemia - unclear what the triggering event is  - pt is on low dose insulin at home but we have lowered it to 2 U BID  - appreciate diabetic educator assistance - A1C is 6.7  Active Problems:  Acute renal failure - appears to be pre renal in etiology - continue to hold Losartan    Hyperlipidemia - continue statin    HTN (hypertension) - on losartan at home but has been on hold for now until renal function stabilizes - continue hydralazine instead for better blood pressure control     Lung nodular opacity on  CT chest - please see follow up recommendations below    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  DVT prophylaxis - Lovenox SQ  Code Status: Full.  Family Communication: plan of care discussed with the patient Disposition Plan: Home   IV access:  Peripheral IV  Procedures and diagnostic studies:   Dg Chest 2 View 08/22/2014 No active cardiopulmonary disease.   Medical Consultants:  None  Other Consultants:  PT - recommend rolling walker, order placed  OT - recommend 3:1 bedside commode, order placed  Diabetic educator   IAnti-Infectives:   None       Procedures/Studies: Dg Chest 2 View  08/22/2014   CLINICAL DATA:  Hypoglycemia, cough.  EXAM: CHEST  2 VIEW  COMPARISON:  03/10/2014  FINDINGS: Linear scarring in the lingula. Right lung is clear. Heart is borderline in  size. No effusions or acute bony abnormality.  IMPRESSION: No active cardiopulmonary disease.   Electronically Signed   By: Rolm Baptise M.D.   On: 08/22/2014 09:18   Ct Chest Wo Contrast  08/25/2014   CLINICAL DATA:  Ten day history of shortness of breath and left-sided chest pain. Cough.  EXAM: CT CHEST WITHOUT CONTRAST  TECHNIQUE: Multidetector CT imaging of the chest was performed following the standard protocol without IV contrast.  COMPARISON:  Chest radiograph August 22, 2014  FINDINGS: There is mild bibasilar atelectatic change. There is no edema or consolidation. On axial slice 28 series 5, there is a nodular opacity abutting the pleura measuring 8 x 5 mm in the superior segment of the left lower lobe. No similar nodular opacities identified elsewhere.  There is atherosclerotic change in the aorta. The at ascending thoracic aorta measures 4.1 x 3.9 cm. There are foci of coronary artery calcification as well as calcification in the aortic valve region.  Thyroid is quite diminutive. No thoracic adenopathy. Pericardium is not thickened.  In the visualized upper abdomen, there is a 7 mm cyst in the medial segment left lobe of the liver. There is a cyst arising from the upper pole the right kidney measuring 2.7 x 1.8 cm. There is atherosclerotic change in the upper abdominal aorta.  There is extensive degenerative change in the thoracic spine with diffuse idiopathic skeletal hyperostosis. There are no blastic or lytic bone lesions.  IMPRESSION: No edema or consolidation.  Areas of mild atelectatic change.  8 x 5 mm nodular opacity abutting the pleura in the superior segment left lower lobe. Followup of this nodular opacity should be based on Fleischner Society guidelines. If the patient is at high risk for bronchogenic carcinoma, follow-up chest CT at 3-36months is recommended. If the patient is at low risk for bronchogenic carcinoma, follow-up chest CT at 6-12 months is recommended. This recommendation follows the consensus statement: Guidelines for Management of Small Pulmonary Nodules Detected on CT Scans: A Statement from the [facility] as published in Radiology 2005; 237:395-400.  No appreciable adenopathy. Diminutive thyroid. Atherosclerotic calcification in the aorta. Coronary artery calcification noted.  Mild prominence in the at ascending thoracic aorta. Recommend annual imaging followup by CTA or MRA. This recommendation follows 2010  ACCF/AHA/AATS/ACR/ASA/SCA/SCAI/SIR/STS/SVM Guidelines for the Diagnosis and Management of Patients with Thoracic Aortic Disease. Circulation. 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   Electronically Signed   By: Lowella Grip III M.D.   On: 08/25/2014 09:09   Mr Lumbar Spine Wo Contrast  08/24/2014   CLINICAL DATA:  Low back pain radiating to the left-sided ribs and breast. Evaluate for disc protrusion.  EXAM: MRI LUMBAR SPINE WITHOUT CONTRAST  TECHNIQUE: Multiplanar, multisequence MR imaging of the lumbar spine was performed. No intravenous contrast was administered.  COMPARISON:  MRI lumbar spine 09/15/2013.  FINDINGS: Segmentation: Normal.  Alignment:  Normal.  Vertebrae: No worrisome osseous lesion.Stable L2 hemangioma.  Conus medullaris: Normal in size, signal, and location.  Paraspinal tissues: No evidence for hydronephrosis or paravertebral mass. 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 Consider abdominal sonography.  Disc levels:  L1-L2: Far lateral and foraminal protrusion on the LEFT is redemonstrated and stable in size. LEFT L1 and LEFT L2 nerve root impingement are likely. Superimposed facet disease with BILATERAL facet joint effusions.  L2-L3: Shallow leftward subligamentous protrusion. This too is stable from priors. No definite impingement.  L3-L4: RIGHT paracentral disc protrusion  is redemonstrated. RIGHT subarticular zone narrowing affects the L4 nerve root. Disc material also extends into the RIGHT neural foramen and beyond, potentially affecting the RIGHT L3 nerve root.  L4-L5: Shallow central protrusion. Asymmetric facet and ligamentum flavum hypertrophy on the LEFT. No definite impingement.  L5-S1:  Unremarkable disc space.  Mild facet arthropathy.  Compared with priors, similar appearance is noted.  IMPRESSION: Stable disc findings at L1-2 and L2-3 as described. These are most likely to contribute to  left-sided symptoms.  RIGHT L3-4 protrusion is also stable.  No areas of new disc pathology or significant worsening since 09/15/2013.   Electronically Signed   By: Staci Righter M.D.   On: 08/24/2014 13:32    Discharge Exam: Filed Vitals:   08/26/14 0609  BP: 136/57  Pulse: 60  Temp: 98 F (36.7 C)  Resp: 18   Filed Vitals:   08/25/14 0539 08/25/14 1450 08/25/14 2119 08/26/14 0609  BP: 137/51 134/58 136/43 136/57  Pulse: 56 62 60 60  Temp: 98 F (36.7 C) 98.2 F (36.8 C) 98.7 F (37.1 C) 98 F (36.7 C)  TempSrc: Oral Oral Oral Oral  Resp: 18 18 18 18   Height:      Weight:      SpO2: 100% 100% 99% 100%    General: Pt is alert, follows commands appropriately, not in acute distress Cardiovascular: Regular rate and rhythm, S1/S2 +, no murmurs, no rubs, no gallops Respiratory: Clear to auscultation bilaterally, no wheezing, no crackles, no rhonchi Abdominal: Soft, non tender, non distended, bowel sounds +, no guarding   Discharge Instructions  Discharge Instructions    Diet - low sodium heart healthy    Complete by:  As directed      Increase activity slowly    Complete by:  As directed             Medication List    STOP taking these medications        losartan 50 MG tablet  Commonly known as:  COZAAR      TAKE these medications        allopurinol 300 MG tablet  Commonly known as:  ZYLOPRIM  Take 300 mg by mouth daily.     atorvastatin 40 MG tablet  Commonly known as:  LIPITOR  Take 40 mg by mouth daily.     cholecalciferol 1000 UNITS tablet  Commonly known as:  VITAMIN D  Take 1,000 Units by mouth daily.     clobetasol cream 0.05 %  Commonly known as:  TEMOVATE  Apply 1 application topically daily as needed. For rash on leg     cyclobenzaprine 10 MG tablet  Commonly known as:  FLEXERIL  Take 10 mg by mouth 3 (three) times daily as needed for muscle spasms.     diclofenac sodium 1 % Gel  Commonly known as:  VOLTAREN  Apply 2 g topically 4  (four) times daily as needed (pain). Rub into affected area of foot 2 to 4 times daily     ferrous fumarate 325 (106 FE) MG Tabs tablet  Commonly known as:  HEMOCYTE - 106 mg FE  Take 1 tablet by mouth daily.     folic acid 1 MG tablet  Commonly known as:  FOLVITE  Take 1 mg by mouth daily.     gabapentin 100 MG capsule  Commonly known as:  NEURONTIN  Take 100 mg by mouth 3 (three) times daily as needed (pain).     hydrALAZINE 10  MG tablet  Commonly known as:  APRESOLINE  Take 1 tablet (10 mg total) by mouth every 8 (eight) hours.     HYDROcodone-acetaminophen 5-325 MG per tablet  Commonly known as:  NORCO/VICODIN  Take 1-2 tablets by mouth every 3 (three) hours as needed for moderate pain.     ketoconazole 2 % cream  Commonly known as:  NIZORAL  Apply 1 application topically daily as needed for irritation. Use on the bottom of feet     levothyroxine 200 MCG tablet  Commonly known as:  SYNTHROID, LEVOTHROID  Take 200 mcg by mouth daily before breakfast.     multivitamin per tablet  Take 1 tablet by mouth daily.     NOVOLIN 70/30 RELION (70-30) 100 UNIT/ML injection  Generic drug:  insulin NPH-regular Human  Inject 2 Units into the skin 2 (two) times daily. For high blood sugar     ORENCIA IV  Inject into the vein every 30 (thirty) days.     pregabalin 75 MG capsule  Commonly known as:  LYRICA  Take 75 mg by mouth 2 (two) times daily.     PROAIR HFA 108 (90 BASE) MCG/ACT inhaler  Generic drug:  albuterol  Inhale 1-2 puffs into the lungs every 6 (six) hours as needed for wheezing.     silver sulfADIAZINE 1 % cream  Commonly known as:  SILVADENE  Apply 1 application topically daily.     zolpidem 10 MG tablet  Commonly known as:  AMBIEN  Take 10 mg by mouth at bedtime as needed for sleep.            Follow-up Information    Follow up with KERR,JEFFREY, MD.   Specialty:  Endocrinology   Why:  Left a message with Dr. Cindra Eves office to return the call to the  nursing sation.   Contact information:   [address] [place] [address] [phone]       Follow up with Lilian Coma, MD.   Specialty:  Family Medicine   Contact information:   [address] [place] [address] [phone]       Call Faye Ramsay, MD.   Specialty:  Internal Medicine   Why:  As needed call my cell phone [phone]   Contact information:   [address] [place] [address] [phone]        The results of significant diagnostics from this hospitalization (including imaging, microbiology, ancillary and laboratory) are listed below for reference.     Microbiology: No results found for this or any previous visit (from the past 240 hour(s)).   Labs: Basic Metabolic Panel:  Recent Labs Lab 08/22/14 0823 08/23/14 0433 08/24/14 0425 08/25/14 0453 08/26/14 0450  NA 142 139 139 137 136  K 4.6 4.8 4.8 5.0 4.3  CL 108 108 108 108 107  CO2 27 25 24  21* 22  GLUCOSE 55* 139* 132* 124* 150*  BUN 20 23* 32* 37* 37*  CREATININE 1.02* 1.28* 1.22* 1.33* 1.46*  CALCIUM 9.6 9.0 9.3 9.1 9.0   Liver Function Tests:  Recent Labs Lab 08/22/14 0823 08/23/14 0433  AST 20 16  ALT 16 15  ALKPHOS 80 75  BILITOT 0.3 0.4  PROT 7.6 6.3*  ALBUMIN 3.8 3.3*   No results for input(s): LIPASE, AMYLASE in the last 168 hours. No results for input(s): AMMONIA in the last 168 hours. CBC:  Recent Labs Lab 08/22/14 0823 08/24/14 0425 08/25/14 0453  08/26/14 0450  WBC 9.7 7.2 8.0 8.1  NEUTROABS 7.4  --   --   --   HGB 10.0* 9.4* 9.3* 9.3*  HCT 30.7* 29.4* 29.3* 29.7*  MCV 86.0 86.0 86.9 85.6  PLT 305 249 250 255   Cardiac Enzymes:  Recent Labs Lab 08/25/14 0530  TROPONINI <0.03   BNP: BNP (last 3 results) No results for input(s): BNP in the last 8760 hours.  ProBNP (last 3 results) No results for input(s): PROBNP in the last 8760 hours.  CBG:  Recent Labs Lab  08/25/14 0732 08/25/14 1154 08/25/14 1727 08/25/14 2139 08/26/14 0716  GLUCAP 110* 98 123* 183* 132*     SIGNED: Time coordinating discharge: 30 minutes  MAGICK-Indie Boehne, MD  [facility] 08/26/2014, 9:40 AM Pager [phone]  If 7PM-7AM, please contact night-coverage [email or web address] Password [email or web address]

## 2014-08-29 DIAGNOSIS — M25562 Pain in left knee: Secondary | ICD-10-CM | POA: Diagnosis not present

## 2014-08-29 DIAGNOSIS — R2689 Other abnormalities of gait and mobility: Secondary | ICD-10-CM | POA: Diagnosis not present

## 2014-08-29 DIAGNOSIS — M25561 Pain in right knee: Secondary | ICD-10-CM | POA: Diagnosis not present

## 2014-08-29 DIAGNOSIS — M1711 Unilateral primary osteoarthritis, right knee: Secondary | ICD-10-CM | POA: Diagnosis not present

## 2014-08-29 DIAGNOSIS — M170 Bilateral primary osteoarthritis of knee: Secondary | ICD-10-CM | POA: Diagnosis not present

## 2014-08-30 DIAGNOSIS — D329 Benign neoplasm of meninges, unspecified: Secondary | ICD-10-CM | POA: Diagnosis not present

## 2014-08-30 DIAGNOSIS — M5126 Other intervertebral disc displacement, lumbar region: Secondary | ICD-10-CM | POA: Diagnosis not present

## 2014-08-30 DIAGNOSIS — Z6835 Body mass index (BMI) 35.0-35.9, adult: Secondary | ICD-10-CM | POA: Diagnosis not present

## 2014-08-30 DIAGNOSIS — M5417 Radiculopathy, lumbosacral region: Secondary | ICD-10-CM | POA: Diagnosis not present

## 2014-08-30 DIAGNOSIS — M545 Low back pain: Secondary | ICD-10-CM | POA: Diagnosis not present

## 2014-09-04 ENCOUNTER — Encounter (HOSPITAL_COMMUNITY)
Admission: RE | Admit: 2014-09-04 | Discharge: 2014-09-04 | Disposition: A | Discharge status: Home / Self Care | Payer: Medicare Other | Source: Ambulatory Visit | Attending: Rheumatology | Admitting: Rheumatology

## 2014-09-04 DIAGNOSIS — M069 Rheumatoid arthritis, unspecified: Secondary | ICD-10-CM | POA: Insufficient documentation

## 2014-09-04 DIAGNOSIS — Z5181 Encounter for therapeutic drug level monitoring: Secondary | ICD-10-CM | POA: Insufficient documentation

## 2014-09-04 MED ORDER — DIPHENHYDRAMINE HCL 25 MG PO CAPS: 50.0000 mg | ORAL_CAPSULE | Freq: Once | ORAL | Status: DC

## 2014-09-04 MED ORDER — SODIUM CHLORIDE 0.9 % IV SOLN
750.0000 mg | Freq: Once | INTRAVENOUS | Status: AC
  Administered 2014-09-04: 750 mg via INTRAVENOUS
  Filled 2014-09-04: qty 30

## 2014-09-04 MED ORDER — ACETAMINOPHEN 325 MG PO TABS: 650.0000 mg | ORAL_TABLET | Freq: Once | ORAL | Status: DC

## 2014-09-04 MED ORDER — SODIUM CHLORIDE 0.9 % IV SOLN
Freq: Once | INTRAVENOUS | Status: AC
  Administered 2014-09-04: 13:00:00 via INTRAVENOUS

## 2014-09-05 DIAGNOSIS — I10 Essential (primary) hypertension: Secondary | ICD-10-CM | POA: Diagnosis not present

## 2014-09-05 DIAGNOSIS — R918 Other nonspecific abnormal finding of lung field: Secondary | ICD-10-CM | POA: Diagnosis not present

## 2014-09-05 DIAGNOSIS — D638 Anemia in other chronic diseases classified elsewhere: Secondary | ICD-10-CM | POA: Diagnosis not present

## 2014-09-05 DIAGNOSIS — F0390 Unspecified dementia without behavioral disturbance: Secondary | ICD-10-CM | POA: Diagnosis not present

## 2014-09-05 DIAGNOSIS — N183 Chronic kidney disease, stage 3 (moderate): Secondary | ICD-10-CM | POA: Diagnosis not present

## 2014-09-06 DIAGNOSIS — E1142 Type 2 diabetes mellitus with diabetic polyneuropathy: Secondary | ICD-10-CM | POA: Diagnosis not present

## 2014-09-06 DIAGNOSIS — E039 Hypothyroidism, unspecified: Secondary | ICD-10-CM | POA: Diagnosis not present

## 2014-09-06 DIAGNOSIS — Z794 Long term (current) use of insulin: Secondary | ICD-10-CM | POA: Diagnosis not present

## 2014-09-08 DIAGNOSIS — R2689 Other abnormalities of gait and mobility: Secondary | ICD-10-CM | POA: Diagnosis not present

## 2014-09-08 DIAGNOSIS — M25561 Pain in right knee: Secondary | ICD-10-CM | POA: Diagnosis not present

## 2014-09-08 DIAGNOSIS — M170 Bilateral primary osteoarthritis of knee: Secondary | ICD-10-CM | POA: Diagnosis not present

## 2014-09-08 DIAGNOSIS — M1711 Unilateral primary osteoarthritis, right knee: Secondary | ICD-10-CM | POA: Diagnosis not present

## 2014-09-08 DIAGNOSIS — M25562 Pain in left knee: Secondary | ICD-10-CM | POA: Diagnosis not present

## 2014-09-19 DIAGNOSIS — R109 Unspecified abdominal pain: Secondary | ICD-10-CM | POA: Diagnosis not present

## 2014-09-20 ENCOUNTER — Emergency Department (HOSPITAL_COMMUNITY): Payer: Medicare Other

## 2014-09-20 ENCOUNTER — Encounter (HOSPITAL_COMMUNITY): Payer: Self-pay | Admitting: Emergency Medicine

## 2014-09-20 ENCOUNTER — Ambulatory Visit (HOSPITAL_COMMUNITY): Admission: RE | Admit: 2014-09-20 | Payer: Medicare Other | Source: Ambulatory Visit

## 2014-09-20 ENCOUNTER — Other Ambulatory Visit (HOSPITAL_COMMUNITY): Payer: Self-pay | Admitting: Family Medicine

## 2014-09-20 ENCOUNTER — Ambulatory Visit (HOSPITAL_COMMUNITY): Payer: Medicare Other

## 2014-09-20 ENCOUNTER — Other Ambulatory Visit: Payer: Self-pay | Admitting: Family Medicine

## 2014-09-20 ENCOUNTER — Emergency Department (HOSPITAL_COMMUNITY)
Admission: EM | Admit: 2014-09-20 | Discharge: 2014-09-20 | Disposition: A | Discharge status: Home / Self Care | Payer: Medicare Other | Attending: Emergency Medicine | Admitting: Emergency Medicine

## 2014-09-20 DIAGNOSIS — Z79899 Other long term (current) drug therapy: Secondary | ICD-10-CM | POA: Diagnosis not present

## 2014-09-20 DIAGNOSIS — R110 Nausea: Secondary | ICD-10-CM | POA: Diagnosis not present

## 2014-09-20 DIAGNOSIS — H409 Unspecified glaucoma: Secondary | ICD-10-CM | POA: Diagnosis not present

## 2014-09-20 DIAGNOSIS — E039 Hypothyroidism, unspecified: Secondary | ICD-10-CM | POA: Diagnosis not present

## 2014-09-20 DIAGNOSIS — E785 Hyperlipidemia, unspecified: Secondary | ICD-10-CM | POA: Diagnosis not present

## 2014-09-20 DIAGNOSIS — R1011 Right upper quadrant pain: Secondary | ICD-10-CM | POA: Insufficient documentation

## 2014-09-20 DIAGNOSIS — Z87891 Personal history of nicotine dependence: Secondary | ICD-10-CM | POA: Insufficient documentation

## 2014-09-20 DIAGNOSIS — Z791 Long term (current) use of non-steroidal anti-inflammatories (NSAID): Secondary | ICD-10-CM | POA: Insufficient documentation

## 2014-09-20 DIAGNOSIS — R0789 Other chest pain: Secondary | ICD-10-CM | POA: Insufficient documentation

## 2014-09-20 DIAGNOSIS — N183 Chronic kidney disease, stage 3 (moderate): Secondary | ICD-10-CM | POA: Diagnosis not present

## 2014-09-20 DIAGNOSIS — I129 Hypertensive chronic kidney disease with stage 1 through stage 4 chronic kidney disease, or unspecified chronic kidney disease: Secondary | ICD-10-CM | POA: Diagnosis not present

## 2014-09-20 DIAGNOSIS — G629 Polyneuropathy, unspecified: Secondary | ICD-10-CM | POA: Diagnosis not present

## 2014-09-20 DIAGNOSIS — Z90710 Acquired absence of both cervix and uterus: Secondary | ICD-10-CM | POA: Diagnosis not present

## 2014-09-20 DIAGNOSIS — Z880 Allergy status to penicillin: Secondary | ICD-10-CM | POA: Diagnosis not present

## 2014-09-20 DIAGNOSIS — M1990 Unspecified osteoarthritis, unspecified site: Secondary | ICD-10-CM | POA: Diagnosis not present

## 2014-09-20 DIAGNOSIS — G8929 Other chronic pain: Secondary | ICD-10-CM | POA: Insufficient documentation

## 2014-09-20 DIAGNOSIS — R1084 Generalized abdominal pain: Secondary | ICD-10-CM

## 2014-09-20 DIAGNOSIS — Z9889 Other specified postprocedural states: Secondary | ICD-10-CM | POA: Diagnosis not present

## 2014-09-20 DIAGNOSIS — D649 Anemia, unspecified: Secondary | ICD-10-CM | POA: Diagnosis not present

## 2014-09-20 DIAGNOSIS — M069 Rheumatoid arthritis, unspecified: Secondary | ICD-10-CM | POA: Insufficient documentation

## 2014-09-20 DIAGNOSIS — E119 Type 2 diabetes mellitus without complications: Secondary | ICD-10-CM | POA: Diagnosis not present

## 2014-09-20 LAB — COMPREHENSIVE METABOLIC PANEL
ALBUMIN: 3.6 g/dL (ref 3.5–5.0)
ALK PHOS: 70 U/L (ref 38–126)
ALT: 14 U/L (ref 14–54)
ANION GAP: 7 (ref 5–15)
AST: 16 U/L (ref 15–41)
BUN: 17 mg/dL (ref 6–20)
CALCIUM: 9 mg/dL (ref 8.9–10.3)
CO2: 24 mmol/L (ref 22–32)
Chloride: 112 mmol/L — ABNORMAL HIGH (ref 101–111)
Creatinine, Ser: 1.11 mg/dL — ABNORMAL HIGH (ref 0.44–1.00)
GFR calc Af Amer: 55 mL/min — ABNORMAL LOW (ref >60)
GFR calc non Af Amer: 47 mL/min — ABNORMAL LOW (ref >60)
GLUCOSE: 106 mg/dL — AB (ref 65–99)
POTASSIUM: 3.8 mmol/L (ref 3.5–5.1)
SODIUM: 143 mmol/L (ref 135–145)
Total Bilirubin: 0.7 mg/dL (ref 0.3–1.2)
Total Protein: 6.9 g/dL (ref 6.5–8.1)

## 2014-09-20 LAB — URINALYSIS, ROUTINE W REFLEX MICROSCOPIC
BILIRUBIN URINE: NEGATIVE
GLUCOSE, UA: NEGATIVE mg/dL
HGB URINE DIPSTICK: NEGATIVE
KETONES UR: NEGATIVE mg/dL
LEUKOCYTES UA: NEGATIVE
Nitrite: NEGATIVE
PH: 5.5 (ref 5.0–8.0)
PROTEIN: NEGATIVE mg/dL
Specific Gravity, Urine: 1.011 (ref 1.005–1.030)
UROBILINOGEN UA: 0.2 mg/dL (ref 0.0–1.0)

## 2014-09-20 LAB — I-STAT TROPONIN, ED
TROPONIN I, POC: 0 ng/mL (ref 0.00–0.08)
Troponin i, poc: 0.01 ng/mL (ref 0.00–0.08)

## 2014-09-20 LAB — CBC WITH DIFFERENTIAL/PLATELET
BASOS ABS: 0 10*3/uL (ref 0.0–0.1)
BASOS PCT: 0 %
EOS ABS: 0.1 10*3/uL (ref 0.0–0.7)
Eosinophils Relative: 2 %
HCT: 28.7 % — ABNORMAL LOW (ref 36.0–46.0)
HEMOGLOBIN: 9.3 g/dL — AB (ref 12.0–15.0)
Lymphocytes Relative: 46 %
Lymphs Abs: 3.7 10*3/uL (ref 0.7–4.0)
MCH: 27.3 pg (ref 26.0–34.0)
MCHC: 32.4 g/dL (ref 30.0–36.0)
MCV: 84.2 fL (ref 78.0–100.0)
MONOS PCT: 6 %
Monocytes Absolute: 0.5 10*3/uL (ref 0.1–1.0)
NEUTROS PCT: 45 %
Neutro Abs: 3.7 10*3/uL (ref 1.7–7.7)
Platelets: 237 10*3/uL (ref 150–400)
RBC: 3.41 MIL/uL — ABNORMAL LOW (ref 3.87–5.11)
RDW: 14.9 % (ref 11.5–15.5)
WBC: 8.1 10*3/uL (ref 4.0–10.5)

## 2014-09-20 LAB — LIPASE, BLOOD: Lipase: 19 U/L — ABNORMAL LOW (ref 22–51)

## 2014-09-20 MED ORDER — MORPHINE SULFATE (PF) 4 MG/ML IV SOLN
4.0000 mg | Freq: Once | INTRAVENOUS | Status: AC
  Administered 2014-09-20: 4 mg via INTRAVENOUS
  Filled 2014-09-20: qty 1

## 2014-09-20 MED ORDER — SODIUM CHLORIDE 0.9 % IV BOLUS (SEPSIS)
1000.0000 mL | Freq: Once | INTRAVENOUS | Status: AC
Start: 2014-09-20 — End: 2014-09-20
  Administered 2014-09-20: 1000 mL via INTRAVENOUS

## 2014-09-20 MED ORDER — ONDANSETRON HCL 4 MG/2ML IJ SOLN
4.0000 mg | Freq: Once | INTRAMUSCULAR | Status: AC
  Administered 2014-09-20: 4 mg via INTRAVENOUS
  Filled 2014-09-20: qty 2

## 2014-09-20 MED ORDER — NAPROXEN 500 MG PO TABS
500.0000 mg | ORAL_TABLET | Freq: Once | ORAL | Status: AC
  Administered 2014-09-20: 500 mg via ORAL
  Filled 2014-09-20: qty 1

## 2014-09-20 NOTE — Discharge Instructions (Signed)
Return without fail for worsening symptoms, including worsening pain, difficulty breathing, vomiting and unable to keep down food/fluids, or any other symptoms concerning to you.   Chest Wall Pain Chest wall pain is pain felt in or around the chest bones and muscles. It may take up to 6 weeks to get better. It may take longer if you are active. Chest wall pain can happen on its own. Other times, things like germs, injury, coughing, or exercise can cause the pain. HOME CARE   Avoid activities that make you tired or cause pain. Try not to use your chest, belly (abdominal), or side muscles. Do not use heavy weights.  Put ice on the sore area.  Put ice in a plastic bag.  Place a towel between your skin and the bag.  Leave the ice on for 15-20 minutes for the first 2 days.  Only take medicine as told by your doctor. GET HELP RIGHT AWAY IF:   You have more pain or are very uncomfortable.  You have a fever.  Your chest pain gets worse.  You have new problems.  You feel sick to your stomach (nauseous) or throw up (vomit).  You start to sweat or feel lightheaded.  You have a cough with mucus (phlegm).  You cough up blood. MAKE SURE YOU:   Understand these instructions.  Will watch your condition.  Will get help right away if you are not doing well or get worse. Document Released: 06/11/2007 Document Revised: 03/17/2011 Document Reviewed: 08/19/2010 [facility] Patient Information 2015 [place], [place]. This information is not intended to replace advice given to you by your health care provider. Make sure you discuss any questions you have with your health care provider.

## 2014-09-20 NOTE — ED Notes (Signed)
Patient request the IV team did not want me to stick in her hand.  She stated they always call the IV team.  I made the nurse aware.

## 2014-09-20 NOTE — ED Notes (Signed)
Attempted to collect labs from pt, however pts daughter yelling at me stating i will not stick her mother because "last time i was here you stuck me in the wrong place"

## 2014-09-20 NOTE — ED Notes (Signed)
Unable to collect labs at this time daughter is refusing for mom to get suck.  I made MD aware and she is going in to talk to them.

## 2014-09-20 NOTE — ED Notes (Signed)
Dr. Oleta Mouse notified that we are unable to get labs nor iv meds due to patient requesting iv team stick.  Iv team consult has been ordered.

## 2014-09-20 NOTE — ED Notes (Signed)
Pt c/o right rib pain radiating to RUQ abdomen x4 days. Pt states PCP told her to come to ED for ultrasound of gallbladder. C/o nausea, denies vomiting with one episode of diarrhea.

## 2014-09-20 NOTE — ED Notes (Signed)
ivt and ED staff unable to collect blood. Pt states she is very dehydrated. Dr. Oleta Mouse notified.  NS 1000 bolus ordered.

## 2014-09-20 NOTE — ED Notes (Signed)
Delay on I-STAT Troponin due to patient wished not to have veins accessed.  Treatment team is aware

## 2014-09-20 NOTE — ED Notes (Signed)
Unable to perform EKG at this time- ultrasound tech in room.

## 2014-09-20 NOTE — ED Provider Notes (Signed)
CSN: [id number]     Arrival date & time 09/20/14  1126 History   First MD Initiated Contact with Patient 09/20/14 1151     Chief Complaint  Patient presents with  . Abdominal Pain     (Consider location/radiation/quality/duration/timing/severity/associated sxs/prior Treatment) HPI 73 year old female with w/o of sigmoidectomy and hysterectomy who presents with RUQ abdominal pain.  Also history of DM, HTN, HLD, and mild AS. Reports abdominal discomfort after eating over the weekend that resolved. Has had right upper abdominal pain and lower chest wall pain that has been constant all day. This is worse with movement and localizes pain to right lower ribs. This is associated with nausea. She has had some loose stools and feels sweaty at night. No fever or chills. Denies melena, hematochezia, dysuria.  Has had mild cough but no shortness of breath. Pain not pleuritic. No LE edema, DOE, orthopnea. NO trauma, and unsure if she may have strained herself, but denies exertional activity. Seen by PCP yesterday and sent for RUQ ultrasound for evaluation of biliary colic. Came to ED today because she thought her outpatient ultrasound was ordered here.    Past Medical History  Diagnosis Date  . Rheumatoid arthritis(714.0)   . Glaucoma   . HTN (hypertension)   . Hypothyroidism   . Diabetes mellitus   . Hypercholesteremia   . Chronic pain   . Allergic rhinitis   . Peripheral neuropathy   . Osteoarthritis   . Diverticulosis   . Hemorrhoids     internal  . PPD positive     6 months ago  . Anemia   . Sickle cell trait   . Meningioma 2008  . CKD (chronic kidney disease), stage III   . Aortic stenosis 12/22/2012    Mild 3/14-2.35 m/s peak velocity  . Hyperlipidemia 12/22/2012  . PVC (premature ventricular contraction) 12/22/2012   Past Surgical History  Procedure Laterality Date  . Joint replacement  2001    left TKR  . Fistula repair  03/2010    partial colectomy  . Intracranial surgery   11/2007    removal of meningioma  . Total thyroidectomy  1992  . Rotator cuff repair  1999    BIlateral  . Sigmoidectomy  2012  . Abdominal hysterectomy  1978    TAH,& BSO 1  . Tonsillectomy and adenoidectomy    . Wrist sx      right   . Left heart catheterization with coronary angiogram N/A 08/08/2013    Procedure: LEFT HEART CATHETERIZATION WITH CORONARY ANGIOGRAM;  Surgeon: Peter M Martinique, MD;  Location: [facility] CATH LAB;  Service: Cardiovascular;  Laterality: N/A;   Family History  Problem Relation Age of Onset  . Hypotension Mother   . CVA Mother   . Diabetes Mother   . Hypertension Father   . Kidney failure Father    Social History  Substance Use Topics  . Smoking status: Former Smoker    Quit date: 01/06/1962  . Smokeless tobacco: Never Used  . Alcohol Use: Yes     Comment: occasionally   OB History    No data available     Review of Systems 10/14 systems reviewed and are negative other than those stated in the HPI    Allergies  Penicillins; Methotrexate derivatives; Other; and Zocor  Home Medications   Prior to Admission medications   Medication Sig Start Date End Date Taking? Authorizing Provider  Abatacept (ORENCIA IV) Inject into the vein every 30 (thirty) days.  Yes Historical Provider, MD  allopurinol (ZYLOPRIM) 300 MG tablet Take 300 mg by mouth daily.   Yes Historical Provider, MD  atorvastatin (LIPITOR) 40 MG tablet Take 40 mg by mouth daily.   Yes Historical Provider, MD  cholecalciferol (VITAMIN D) 1000 UNITS tablet Take 1,000 Units by mouth daily.    Yes Historical Provider, MD  clobetasol cream (TEMOVATE) [id number] % Apply 1 application topically daily as needed. For rash on leg 02/11/13  Yes Historical Provider, MD  cyclobenzaprine (FLEXERIL) 10 MG tablet Take 10 mg by mouth 3 (three) times daily as needed for muscle spasms.    Yes Historical Provider, MD  diclofenac sodium (VOLTAREN) 1 % GEL Apply 2 g topically 4 (four) times daily as needed (pain). Rub  into affected area of foot 2 to 4 times daily 01/19/13  Yes Bronson Ing, DPM  ferrous fumarate (HEMOCYTE - 106 MG FE) 325 (106 FE) MG TABS Take 1 tablet by mouth daily.    Yes Historical Provider, MD  folic acid (FOLVITE) 1 MG tablet Take 1 mg by mouth daily.   Yes Historical Provider, MD  gabapentin (NEURONTIN) 100 MG capsule Take 100 mg by mouth 3 (three) times daily as needed (pain).    Yes Historical Provider, MD  ketoconazole (NIZORAL) 2 % cream Apply 1 application topically daily as needed for irritation. Use on the bottom of feet 03/02/13  Yes Historical Provider, MD  levothyroxine (SYNTHROID, LEVOTHROID) 200 MCG tablet Take 200 mcg by mouth daily before breakfast.   Yes Historical Provider, MD  multivitamin (THERAGRAN) per tablet Take 1 tablet by mouth daily.   Yes Historical Provider, MD  NOVOLIN 70/30 RELION (70-30) 100 UNIT/ML injection Inject 2 Units into the skin 2 (two) times daily. For high blood sugar 08/26/14  Yes Theodis Blaze, MD  pregabalin (LYRICA) 75 MG capsule Take 75 mg by mouth 2 (two) times daily.   Yes Historical Provider, MD  PROAIR HFA 108 (90 BASE) MCG/ACT inhaler Inhale 1-2 puffs into the lungs every 6 (six) hours as needed for wheezing.  05/12/14  Yes Historical Provider, MD  zolpidem (AMBIEN) 10 MG tablet Take 10 mg by mouth at bedtime as needed for sleep.    Yes Historical Provider, MD  hydrALAZINE (APRESOLINE) 10 MG tablet Take 1 tablet (10 mg total) by mouth every 8 (eight) hours. Patient not taking: Reported on 09/20/2014 08/26/14   Theodis Blaze, MD  HYDROcodone-acetaminophen (NORCO/VICODIN) 5-325 MG per tablet Take 1-2 tablets by mouth every 3 (three) hours as needed for moderate pain. Patient not taking: Reported on 09/20/2014 08/26/14   Theodis Blaze, MD  silver sulfADIAZINE (SILVADENE) 1 % cream Apply 1 application topically daily. 04/13/13   Viona Gilmore Egerton, DPM   BP 152/73 mmHg  Pulse 67  Temp(Src) 98.2 F (36.8 C) (Oral)  Resp 17  SpO2 96% Physical  Exam Physical Exam  Nursing note and vitals reviewed. Constitutional: Well developed, well nourished, non-toxic, and in no acute distress Head: Normocephalic and atraumatic.  Mouth/Throat: Oropharynx is clear and moist.  Neck: Normal range of motion. Neck supple.  Cardiovascular: Normal rate and regular rhythm.  No lower extremity edema.  Pulmonary/Chest: Effort normal and breath sounds normal. There is tenderness to palpation over 12th rib over the mid axillary line and within the intercostal space between 11th and 12th rib, which fully reproduces pain.  Abdominal: Soft. There is tenderness to deep palpation of RUQ, but negative murphy's sign. There is no rebound and no guarding.  No CVA tenderness. Musculoskeletal: Normal range of motion.  Neurological: Alert, no facial droop, fluent speech, moves all extremities symmetrically Skin: Skin is warm and dry.  Psychiatric: Cooperative  ED Course  Procedures (including critical care time) Labs Review Labs Reviewed  CBC WITH DIFFERENTIAL/PLATELET - Abnormal; Notable for the following:    RBC 3.41 (*)    Hemoglobin 9.3 (*)    HCT 28.7 (*)    All other components within normal limits  COMPREHENSIVE METABOLIC PANEL - Abnormal; Notable for the following:    Chloride 112 (*)    Glucose, Bld 106 (*)    Creatinine, Ser 1.11 (*)    GFR calc non Af Amer 47 (*)    GFR calc Af Amer 55 (*)    All other components within normal limits  LIPASE, BLOOD - Abnormal; Notable for the following:    Lipase 19 (*)    All other components within normal limits  URINALYSIS, ROUTINE W REFLEX MICROSCOPIC (NOT AT [facility])  I-STAT TROPOININ, ED  Randolm Idol, ED    Imaging Review Dg Chest 2 View  09/20/2014   CLINICAL DATA:  Right lower chest pain radiating to the back.  EXAM: CHEST  2 VIEW  COMPARISON:  Chest CT dated 08/25/2014 and chest x-ray dated 08/22/2014  FINDINGS: Heart size and pulmonary vascularity are normal. No infiltrates or effusions. Tiny areas  of linear scarring at the left lung base, stable. No all acute osseous abnormality.  IMPRESSION: No acute abnormality.  No change since 08/22/2014.   Electronically Signed   By: Lorriane Shire M.D.   On: 09/20/2014 14:55   US Abdomen Limited Ruq  09/20/2014   CLINICAL DATA:  Right upper quadrant pain for 3 days with nausea.  EXAM: US ABDOMEN LIMITED - RIGHT UPPER QUADRANT  COMPARISON:  None.  FINDINGS: Gallbladder:  No gallstones or wall thickening visualized. No sonographic Murphy sign noted.  Common bile duct:  Diameter: 3 mm  Liver:  Simple 11 mm cyst centrally. No evidence of solid lesion. Normal echogenicity and size. Antegrade flow in imaged portal venous system.  Partially visualized simple appearing right upper pole renal cyst.  IMPRESSION: Negative right upper quadrant ultrasound.   Electronically Signed   By: Monte Fantasia M.D.   On: 09/20/2014 12:56   I have personally reviewed and evaluated these images and lab results as part of my medical decision-making.   EKG Interpretation   Date/Time:  Wednesday September 20 2014 13:07:48 EDT Ventricular Rate:  56 PR Interval:  157 QRS Duration: 84 QT Interval:  474 QTC Calculation: 457 R Axis:   32 Text Interpretation:  Sinus rhythm No significant change since last  tracing Confirmed by Abby Stines MD, Rileigh Kawashima [id number]) on 09/20/2014 2:35:07 PM      MDM   Final diagnoses:  RUQ abdominal pain  Chest wall pain    74 year old female who presents with c/f RUQ abdominal pain. She is well appearing and non-toxic. VS are not concerning. On exam, she reports that pain is fully reproducible at pinpoint location over 11th to 12th intercostal space and over 12th rib in the mid axillary line. RUQ pain noted, but reports that it is because I am stretching the muscle overlying that area of pinpoint pain. No true murphy's sign. Remainder of abdominal exam reveals no significant tenderness. RUQ US show no acute hepatobiliary pathology. Unremarkble CBC, CMP,  lipase. Did not feel presentation concerning for acute intraabdominal process. CXR shows no acute cardiopulmonary processes. EKG non-ischemic and  troponin x 1 negative. Given reproducibility of pain and exacerbation with movement and not exertion, this does not seem consistent with ACS. No concern for PE either as no SOB, tachycardia or tachypnea, and pain not pleuritic. Naprosyn given for pain. Will perform serial troponin and EKG in setting of known DM, age, HTN, HLD. If negative, presentation seem more consistent with MSK pain. Will discharge with close follow-up with PCP.     Forde Dandy, MD 09/20/14 [phone]

## 2014-09-20 NOTE — ED Notes (Signed)
IV team consult requested per patients request.  She refuses any other sticks except for iv team.

## 2014-09-22 ENCOUNTER — Emergency Department (HOSPITAL_COMMUNITY)
Admission: EM | Admit: 2014-09-22 | Discharge: 2014-09-23 | Disposition: A | Discharge status: Home / Self Care | Payer: Medicare Other | Attending: Emergency Medicine | Admitting: Emergency Medicine

## 2014-09-22 ENCOUNTER — Encounter (HOSPITAL_COMMUNITY): Payer: Self-pay | Admitting: Emergency Medicine

## 2014-09-22 ENCOUNTER — Other Ambulatory Visit: Payer: Medicare Other

## 2014-09-22 ENCOUNTER — Emergency Department (HOSPITAL_COMMUNITY): Payer: Medicare Other

## 2014-09-22 DIAGNOSIS — Z79899 Other long term (current) drug therapy: Secondary | ICD-10-CM | POA: Insufficient documentation

## 2014-09-22 DIAGNOSIS — R109 Unspecified abdominal pain: Secondary | ICD-10-CM | POA: Diagnosis present

## 2014-09-22 DIAGNOSIS — M1990 Unspecified osteoarthritis, unspecified site: Secondary | ICD-10-CM | POA: Diagnosis not present

## 2014-09-22 DIAGNOSIS — I82621 Acute embolism and thrombosis of deep veins of right upper extremity: Secondary | ICD-10-CM | POA: Diagnosis not present

## 2014-09-22 DIAGNOSIS — H409 Unspecified glaucoma: Secondary | ICD-10-CM | POA: Insufficient documentation

## 2014-09-22 DIAGNOSIS — E119 Type 2 diabetes mellitus without complications: Secondary | ICD-10-CM | POA: Insufficient documentation

## 2014-09-22 DIAGNOSIS — G8929 Other chronic pain: Secondary | ICD-10-CM | POA: Insufficient documentation

## 2014-09-22 DIAGNOSIS — Z23 Encounter for immunization: Secondary | ICD-10-CM | POA: Diagnosis not present

## 2014-09-22 DIAGNOSIS — I129 Hypertensive chronic kidney disease with stage 1 through stage 4 chronic kidney disease, or unspecified chronic kidney disease: Secondary | ICD-10-CM | POA: Diagnosis not present

## 2014-09-22 DIAGNOSIS — E039 Hypothyroidism, unspecified: Secondary | ICD-10-CM | POA: Diagnosis not present

## 2014-09-22 DIAGNOSIS — I82401 Acute embolism and thrombosis of unspecified deep veins of right lower extremity: Secondary | ICD-10-CM

## 2014-09-22 DIAGNOSIS — G629 Polyneuropathy, unspecified: Secondary | ICD-10-CM | POA: Diagnosis not present

## 2014-09-22 DIAGNOSIS — Z87891 Personal history of nicotine dependence: Secondary | ICD-10-CM | POA: Diagnosis not present

## 2014-09-22 DIAGNOSIS — I82701 Chronic embolism and thrombosis of unspecified veins of right upper extremity: Secondary | ICD-10-CM | POA: Diagnosis not present

## 2014-09-22 DIAGNOSIS — R0602 Shortness of breath: Secondary | ICD-10-CM | POA: Diagnosis not present

## 2014-09-22 DIAGNOSIS — Z8719 Personal history of other diseases of the digestive system: Secondary | ICD-10-CM | POA: Insufficient documentation

## 2014-09-22 DIAGNOSIS — Z880 Allergy status to penicillin: Secondary | ICD-10-CM | POA: Diagnosis not present

## 2014-09-22 DIAGNOSIS — Z8619 Personal history of other infectious and parasitic diseases: Secondary | ICD-10-CM | POA: Diagnosis not present

## 2014-09-22 DIAGNOSIS — E780 Pure hypercholesterolemia: Secondary | ICD-10-CM | POA: Insufficient documentation

## 2014-09-22 DIAGNOSIS — H40023 Open angle with borderline findings, high risk, bilateral: Secondary | ICD-10-CM | POA: Diagnosis not present

## 2014-09-22 DIAGNOSIS — N183 Chronic kidney disease, stage 3 (moderate): Secondary | ICD-10-CM | POA: Diagnosis not present

## 2014-09-22 HISTORY — DX: Acute embolism and thrombosis of unspecified deep veins of unspecified lower extremity: I82.409

## 2014-09-22 LAB — CBC
HCT: 32.5 % — ABNORMAL LOW (ref 36.0–46.0)
Hemoglobin: 10.5 g/dL — ABNORMAL LOW (ref 12.0–15.0)
MCH: 27.9 pg (ref 26.0–34.0)
MCHC: 32.3 g/dL (ref 30.0–36.0)
MCV: 86.2 fL (ref 78.0–100.0)
PLATELETS: 264 10*3/uL (ref 150–400)
RBC: 3.77 MIL/uL — ABNORMAL LOW (ref 3.87–5.11)
RDW: 15.2 % (ref 11.5–15.5)
WBC: 8.8 10*3/uL (ref 4.0–10.5)

## 2014-09-22 LAB — BASIC METABOLIC PANEL
Anion gap: 9 (ref 5–15)
BUN: 23 mg/dL — AB (ref 6–20)
CHLORIDE: 106 mmol/L (ref 101–111)
CO2: 26 mmol/L (ref 22–32)
CREATININE: 1.44 mg/dL — AB (ref 0.44–1.00)
Calcium: 9.4 mg/dL (ref 8.9–10.3)
GFR calc Af Amer: 40 mL/min — ABNORMAL LOW (ref >60)
GFR calc non Af Amer: 35 mL/min — ABNORMAL LOW (ref >60)
GLUCOSE: 136 mg/dL — AB (ref 65–99)
Potassium: 4.7 mmol/L (ref 3.5–5.1)
Sodium: 141 mmol/L (ref 135–145)

## 2014-09-22 LAB — PROTIME-INR
INR: 1.02 (ref 0.00–1.49)
Prothrombin Time: 13.6 seconds (ref 11.6–15.2)

## 2014-09-22 LAB — APTT: aPTT: 25 seconds (ref 24–37)

## 2014-09-22 MED ORDER — IOHEXOL 350 MG/ML SOLN
100.0000 mL | Freq: Once | INTRAVENOUS | Status: AC | PRN
  Administered 2014-09-22: 80 mL via INTRAVENOUS

## 2014-09-22 MED ORDER — RIVAROXABAN 15 MG PO TABS
15.0000 mg | ORAL_TABLET | Freq: Once | ORAL | Status: AC
  Administered 2014-09-22: 15 mg via ORAL
  Filled 2014-09-22: qty 1

## 2014-09-22 NOTE — ED Provider Notes (Signed)
CSN: [id number]     Arrival date & time 09/22/14  1825 History   First MD Initiated Contact with Patient 09/22/14 1921     Chief Complaint  Patient presents with  . DVT     (Consider location/radiation/quality/duration/timing/severity/associated sxs/prior Treatment) HPI  The patient is a 74 year old female, she was recently seen in the emergency department and evaluated for abdominal pain, during that process an IV was placed in her right arm and has caused some bruising and tenderness. She followed up for an ultrasound of her arm today as an outpatient and was told to come to the emergency department because of a DVT existing in the brachial vein of the right upper extremity. She reports to me that she has ongoing mild to moderate pain in that arm with mild swelling, she states that she has also had some shortness of breath which started last night and has been intermittent. She denies chest pain, fever, chills, cough. The symptoms are persistent, worse with palpation. She is on baby aspirin but no other blood thinners, denies history of DVT or other risk factors other than recent IV.  Past Medical History  Diagnosis Date  . Rheumatoid arthritis(714.0)   . Glaucoma   . HTN (hypertension)   . Hypothyroidism   . Diabetes mellitus   . Hypercholesteremia   . Chronic pain   . Allergic rhinitis   . Peripheral neuropathy   . Osteoarthritis   . Diverticulosis   . Hemorrhoids     internal  . PPD positive     6 months ago  . Anemia   . Sickle cell trait   . Meningioma 2008  . CKD (chronic kidney disease), stage III   . Aortic stenosis 12/22/2012    Mild 3/14-2.35 m/s peak velocity  . Hyperlipidemia 12/22/2012  . PVC (premature ventricular contraction) 12/22/2012  . DVT (deep venous thrombosis)    Past Surgical History  Procedure Laterality Date  . Joint replacement  2001    left TKR  . Fistula repair  03/2010    partial colectomy  . Intracranial surgery  11/2007    removal of  meningioma  . Total thyroidectomy  1992  . Rotator cuff repair  1999    BIlateral  . Sigmoidectomy  2012  . Abdominal hysterectomy  1978    TAH,& BSO 1  . Tonsillectomy and adenoidectomy    . Wrist sx      right   . Left heart catheterization with coronary angiogram N/A 08/08/2013    Procedure: LEFT HEART CATHETERIZATION WITH CORONARY ANGIOGRAM;  Surgeon: Peter M Martinique, MD;  Location: [facility] CATH LAB;  Service: Cardiovascular;  Laterality: N/A;   Family History  Problem Relation Age of Onset  . Hypotension Mother   . CVA Mother   . Diabetes Mother   . Hypertension Father   . Kidney failure Father    Social History  Substance Use Topics  . Smoking status: Former Smoker    Quit date: 01/06/1962  . Smokeless tobacco: Never Used  . Alcohol Use: Yes     Comment: occasionally   OB History    No data available     Review of Systems  All other systems reviewed and are negative.     Allergies  Penicillins; Methotrexate derivatives; Other; and Zocor  Home Medications   Prior to Admission medications   Medication Sig Start Date End Date Taking? Authorizing Provider  allopurinol (ZYLOPRIM) 300 MG tablet Take 300 mg by mouth daily.  Yes Historical Provider, MD  atorvastatin (LIPITOR) 40 MG tablet Take 40 mg by mouth daily.   Yes Historical Provider, MD  cholecalciferol (VITAMIN D) 1000 UNITS tablet Take 1,000 Units by mouth daily.    Yes Historical Provider, MD  cyclobenzaprine (FLEXERIL) 10 MG tablet Take 10 mg by mouth 3 (three) times daily as needed for muscle spasms.    Yes Historical Provider, MD  diclofenac sodium (VOLTAREN) 1 % GEL Apply 2 g topically 4 (four) times daily as needed (pain). Rub into affected area of foot 2 to 4 times daily 01/19/13  Yes Bronson Ing, DPM  ferrous fumarate (HEMOCYTE - 106 MG FE) 325 (106 FE) MG TABS Take 1 tablet by mouth daily.    Yes Historical Provider, MD  folic acid (FOLVITE) 1 MG tablet Take 1 mg by mouth daily.   Yes Historical  Provider, MD  gabapentin (NEURONTIN) 100 MG capsule Take 100 mg by mouth 3 (three) times daily as needed (pain).    Yes Historical Provider, MD  levothyroxine (SYNTHROID, LEVOTHROID) 200 MCG tablet Take 200 mcg by mouth daily before breakfast.   Yes Historical Provider, MD  losartan (COZAAR) 50 MG tablet Take 50 mg by mouth daily.  06/29/14  Yes Historical Provider, MD  multivitamin [facility]) per tablet Take 1 tablet by mouth at bedtime.    Yes Historical Provider, MD  NOVOLIN 70/30 RELION (70-30) 100 UNIT/ML injection Inject 2 Units into the skin 2 (two) times daily. For high blood sugar 08/26/14  Yes Theodis Blaze, MD  pregabalin (LYRICA) 75 MG capsule Take 75-150 mg by mouth 2 (two) times daily. Take 1 capsule in the am and Take 2 capsules at bedtime.   Yes Historical Provider, MD  zolpidem (AMBIEN) 10 MG tablet Take 10 mg by mouth at bedtime as needed for sleep.    Yes Historical Provider, MD  Abatacept (ORENCIA IV) Inject into the vein every 30 (thirty) days.     Historical Provider, MD  hydrALAZINE (APRESOLINE) 10 MG tablet Take 1 tablet (10 mg total) by mouth every 8 (eight) hours. 08/26/14   Theodis Blaze, MD  HYDROcodone-acetaminophen (NORCO/VICODIN) 5-325 MG per tablet Take 1-2 tablets by mouth every 3 (three) hours as needed for moderate pain. Patient not taking: Reported on 09/20/2014 08/26/14   Theodis Blaze, MD  Rivaroxaban (XARELTO STARTER PACK) 15 & 20 MG TBPK Take as directed on package: Start with one 15mg  tablet by mouth twice a day with food. On Day 22, switch to one 20mg  tablet once a day with food. 09/23/14   Noemi Chapel, MD   BP 159/59 mmHg  Pulse 58  Temp(Src) 98.2 F (36.8 C) (Oral)  Resp 13  Ht 5\' 4"  (1.626 m)  Wt 200 lb (90.719 kg)  BMI 34.31 kg/m2  SpO2 100% Physical Exam  Constitutional: She appears well-developed and well-nourished. No distress.  HENT:  Head: Normocephalic and atraumatic.  Mouth/Throat: Oropharynx is clear and moist. No oropharyngeal exudate.   Eyes: Conjunctivae and EOM are normal. Pupils are equal, round, and reactive to light. Right eye exhibits no discharge. Left eye exhibits no discharge. No scleral icterus.  Neck: Normal range of motion. Neck supple. No JVD present. No thyromegaly present.  Cardiovascular: Normal rate, regular rhythm, normal heart sounds and intact distal pulses.  Exam reveals no gallop and no friction rub.   No murmur heard. Normal heart sounds, no carotid bruits, no JVD, normal pulses at the radial arteries bilaterally  Pulmonary/Chest: Effort normal and  breath sounds normal. No respiratory distress. She has no wheezes. She has no rales.  Abdominal: Soft. Bowel sounds are normal. She exhibits no distension and no mass. There is no tenderness.  Musculoskeletal: Normal range of motion. She exhibits tenderness (mild tenderness of the right forearm over the area of bruising on the volar surface). She exhibits no edema.  Tenderness extends up the right arm on the medial surface, no obvious asymmetry  Lymphadenopathy:    She has no cervical adenopathy.  Neurological: She is alert. Coordination normal.  Skin: Skin is warm and dry. No rash noted. No erythema.  Psychiatric: She has a normal mood and affect. Her behavior is normal.  Nursing note and vitals reviewed.   ED Course  Procedures (including critical care time) Labs Review Labs Reviewed  BASIC METABOLIC PANEL - Abnormal; Notable for the following:    Glucose, Bld 136 (*)    BUN 23 (*)    Creatinine, Ser 1.44 (*)    GFR calc non Af Amer 35 (*)    GFR calc Af Amer 40 (*)    All other components within normal limits  CBC - Abnormal; Notable for the following:    RBC 3.77 (*)    Hemoglobin 10.5 (*)    HCT 32.5 (*)    All other components within normal limits  PROTIME-INR  APTT    Imaging Review Ct Angio Chest Pe W/cm &/or Wo Cm  09/23/2014   CLINICAL DATA:  74 year old female with shortness of breath  EXAM: CT ANGIOGRAPHY CHEST WITH CONTRAST   TECHNIQUE: Multidetector CT imaging of the chest was performed using the standard protocol during bolus administration of intravenous contrast. Multiplanar CT image reconstructions and MIPs were obtained to evaluate the vascular anatomy.  CONTRAST:  31mL OMNIPAQUE IOHEXOL 350 MG/ML SOLN  COMPARISON:  Radiograph dated 09/20/2014  FINDINGS: The lungs are clear. Linear atelectasis/scarring noted at the right lung base medially. There is no pleural effusion or pneumothorax. The central airways are patent.  The thoracic aorta appears unremarkable. No CT evidence of pulmonary embolism. There is no cardiomegaly or pericardial effusion. There is coronary vascular calcification. There is no hilar or mediastinal adenopathy. The thyroid gland is not visualized. Esophagus is grossly unremarkable.  There is no axillary adenopathy. The chest wall soft tissues appear unremarkable. There is extensive degenerative changes of the spine with multilevel anterior bridging osteophytes compatible with diffuse idiopathic skeletal hyperostosis. No acute fracture.  Partially visualized left renal hypodense lesion, incompletely characterized, possibly a cyst  Review of the MIP images confirms the above findings.  IMPRESSION: No CT evidence of pulmonary embolism.   Electronically Signed   By: Anner Crete M.D.   On: 09/23/2014 00:04   I have personally reviewed and evaluated these images and lab results as part of my medical decision-making.   EKG Interpretation   Date/Time:  Friday September 22 2014 19:13:38 EDT Ventricular Rate:  60 PR Interval:  143 QRS Duration: 78 QT Interval:  396 QTC Calculation: 396 R Axis:   44 Text Interpretation:  Sinus rhythm Multiple premature complexes, vent   Probable LVH with secondary repol abnrm since last tracing no significant  change Confirmed by MILLER  MD, BRIAN ([id number]) on 09/22/2014 7:18:15 PM      MDM   Final diagnoses:  DVT (deep venous thrombosis), right    Vital signs  are unremarkable, ultrasound report with the patient confirms brachial DVT on the right. Because of shortness of breath she will need imaging of  her chest, recent workup included ultrasound, will perform CT scan after labs evaluating renal function.  CT negative, pt given dose of Xarelto and starter pack Rx - she has been informed of teh RB and A of anticoagulant therapy and has accepted NOAC XArelto for home - expresses understanding to the indictiations for return.  Meds given in ED:  Medications  Rivaroxaban (XARELTO) tablet 15 mg (15 mg Oral Given 09/22/14 2036)  iohexol (OMNIPAQUE) 350 MG/ML injection 100 mL (80 mLs Intravenous Contrast Given 09/22/14 2333)    New Prescriptions   RIVAROXABAN (XARELTO STARTER PACK) 15 & 20 MG TBPK    Take as directed on package: Start with one 15mg  tablet by mouth twice a day with food. On Day 22, switch to one 20mg  tablet once a day with food.      Noemi Chapel, MD 09/23/14 [phone]

## 2014-09-22 NOTE — ED Notes (Signed)
MD at bedside. 

## 2014-09-22 NOTE — ED Notes (Signed)
Pt had blood work drawn on Wednesday. Since then has had a bruise and pain on her R forearm from the blood draw which gradually moved up her arm. Pt had an ultrasound done on her arm which showed an upper extremity dvt. Pt reports she has some SOB, but speaking in full sentences with no difficulty.

## 2014-09-23 MED ORDER — RIVAROXABAN (XARELTO) VTE STARTER PACK (15 & 20 MG): ORAL_TABLET | ORAL | Status: DC

## 2014-09-23 NOTE — Discharge Instructions (Signed)

## 2014-09-25 DIAGNOSIS — I82409 Acute embolism and thrombosis of unspecified deep veins of unspecified lower extremity: Secondary | ICD-10-CM | POA: Diagnosis not present

## 2014-09-29 ENCOUNTER — Encounter (HOSPITAL_COMMUNITY)
Admission: RE | Admit: 2014-09-29 | Discharge: 2014-09-29 | Disposition: A | Discharge status: Home / Self Care | Payer: Medicare Other | Source: Ambulatory Visit | Attending: Rheumatology | Admitting: Rheumatology

## 2014-09-29 ENCOUNTER — Other Ambulatory Visit (HOSPITAL_COMMUNITY): Payer: Self-pay | Admitting: *Deleted

## 2014-09-29 DIAGNOSIS — M069 Rheumatoid arthritis, unspecified: Secondary | ICD-10-CM | POA: Diagnosis not present

## 2014-09-29 MED ORDER — SODIUM CHLORIDE 0.9 % IV SOLN
Freq: Once | INTRAVENOUS | Status: AC
  Administered 2014-09-29: 12:00:00 via INTRAVENOUS

## 2014-09-29 MED ORDER — SODIUM CHLORIDE 0.9 % IV SOLN
750.0000 mg | Freq: Once | INTRAVENOUS | Status: AC
  Administered 2014-09-29: 750 mg via INTRAVENOUS
  Filled 2014-09-29 (×2): qty 30

## 2014-09-29 MED ORDER — ACETAMINOPHEN 325 MG PO TABS: 650.0000 mg | ORAL_TABLET | Freq: Once | ORAL | Status: DC

## 2014-09-29 MED ORDER — DIPHENHYDRAMINE HCL 25 MG PO CAPS: 50.0000 mg | ORAL_CAPSULE | Freq: Once | ORAL | Status: DC

## 2014-10-02 DIAGNOSIS — M170 Bilateral primary osteoarthritis of knee: Secondary | ICD-10-CM | POA: Diagnosis not present

## 2014-10-02 DIAGNOSIS — Z09 Encounter for follow-up examination after completed treatment for conditions other than malignant neoplasm: Secondary | ICD-10-CM | POA: Diagnosis not present

## 2014-10-02 DIAGNOSIS — M069 Rheumatoid arthritis, unspecified: Secondary | ICD-10-CM | POA: Diagnosis not present

## 2014-10-02 DIAGNOSIS — M79641 Pain in right hand: Secondary | ICD-10-CM | POA: Diagnosis not present

## 2014-10-10 DIAGNOSIS — M2550 Pain in unspecified joint: Secondary | ICD-10-CM | POA: Diagnosis not present

## 2014-10-10 DIAGNOSIS — E559 Vitamin D deficiency, unspecified: Secondary | ICD-10-CM | POA: Diagnosis not present

## 2014-10-16 ENCOUNTER — Encounter: Payer: Medicare Other | Attending: Internal Medicine | Admitting: Dietician

## 2014-10-16 ENCOUNTER — Encounter: Payer: Self-pay | Admitting: Dietician

## 2014-10-16 VITALS — Ht 64.0 in | Wt 205.0 lb

## 2014-10-16 DIAGNOSIS — Z794 Long term (current) use of insulin: Secondary | ICD-10-CM | POA: Insufficient documentation

## 2014-10-16 DIAGNOSIS — Z713 Dietary counseling and surveillance: Secondary | ICD-10-CM | POA: Diagnosis not present

## 2014-10-16 DIAGNOSIS — E119 Type 2 diabetes mellitus without complications: Secondary | ICD-10-CM

## 2014-10-16 NOTE — Progress Notes (Signed)
Diabetes Self-Management Education  Visit Type: First/Initial (Patient states she last saw a RD 6-7 months ago.)  Appt. Start Time: 1400 Appt. End Time: [id number]  10/16/2014  [name], identified by name and date of birth, is a 74 y.o. female with a diagnosis of Diabetes: Type 2  Patient is here with her daughter who also has type 2 diabetes.  She would like to learn more about meal planning and how to eat to better control her blood sugar.  Hx includes type 2 diabetes with polyneuropathy, hypothyroid, rheumatoid arthritis, and HTN.  She lives with her husband.  Both do shopping and cooking.  She is currently on a steroid taper for her arthritis.  She has noticed an increase in her blood sugar since starting this medication.  She also has had surgery for a fistula in the past and notes problems with diarrhea with greasy foods, oatmeal, and some vegetables.    ASSESSMENT  Height 5\' 4"  (1.626 m), weight 205 lb (92.987 kg). Body mass index is 35.17 kg/(m^2).      Diabetes Self-Management Education - 10/16/14 1413    Visit Information   Visit Type First/Initial  Patient states she last saw a RD 6-7 months ago.   Initial Visit   Diabetes Type Type 2   Are you currently following a meal plan? No  decreased carbs and sugar   Are you taking your medications as prescribed? Yes  occasionally forgets insulin   Date Diagnosed about 7 years ago   Health Coping   How would you rate your overall health? Fair   Psychosocial Assessment   Patient Belief/Attitude about Diabetes Motivated to manage diabetes   Self-care barriers None   Self-management support Doctor's office;Family   Other persons present Patient;Family Member  patient's daughter   Patient Concerns Nutrition/Meal planning   Special Needs None   Preferred Learning Style No preference indicated   Learning Readiness Ready   How often do you need to have someone help you when you read instructions, pamphlets, or other  written materials from your doctor or pharmacy? 1 - Never   What is the last grade level you completed in school? [id number] grade HS   Complications   Last HgB A1C per patient/outside source 6.7 %  08/23/14 decreased from 7.4% 04/18/14   How often do you check your blood sugar? 3-4 times/day   Fasting Blood glucose range (mg/dL) 70-129   Postprandial Blood glucose range (mg/dL) 70-129   Number of hypoglycemic episodes per month 2   Can you tell when your blood sugar is low? Yes   What do you do if your blood sugar is low? eat or candy   Number of hyperglycemic episodes per week 0   Have you had a dilated eye exam in the past 12 months? Yes   Have you had a dental exam in the past 12 months? Yes   Are you checking your feet? Yes   How many days per week are you checking your feet? 2   Dietary Intake   Breakfast grits, margarine OR cornflakes, banana, 2% milk OR smoothie (banana, 1 cup frozen fruit, with Glucerna and milk or just milk) OR egg and grits  8:30   Lunch sausage, egg, and cheese McGriddle, coffee with splenda and creamer OR Oodles of noodles OR tuna sandwhich or tuna on ritz  1:15   Dinner chicken, vegetables,   5:30   Snack (evening) fruit and cottage cheese OR BLT with cheese  Beverage(s) coffee with splenda and creamer, water, regular or sugar free soda, unsweetened tea with splenda   Exercise   Exercise Type Light (walking / raking leaves)   How many days per week to you exercise? 2   How many minutes per day do you exercise? 45   Total minutes per week of exercise 90   Patient Education   Previous Diabetes Education Yes (please comment)  RD at Dr. Kathi Ludwig   Disease state  Definition of diabetes, type 1 and 2, and the diagnosis of diabetes   Nutrition management  Role of diet in the treatment of diabetes and the relationship between the three main macronutrients and blood glucose level;Food label reading, portion sizes and measuring food.;Meal options for control of blood  glucose level and chronic complications.   Physical activity and exercise  Role of exercise on diabetes management, blood pressure control and cardiac health.  Patient has done water exercise in the past and would like to resume.   Monitoring Identified appropriate SMBG and/or A1C goals.;Daily foot exams;Yearly dilated eye exam   Acute complications Taught treatment of hypoglycemia - the 15 rule.   Chronic complications Relationship between chronic complications and blood glucose control;Dental care;Assessed and discussed foot care and prevention of foot problems   Psychosocial adjustment Identified and addressed patients feelings and concerns about diabetes   Individualized Goals (developed by patient)   Nutrition General guidelines for healthy choices and portions discussed;Follow meal plan discussed   Physical Activity Exercise 3-5 times per week;30 minutes per day   Reducing Risk examine blood glucose patterns;do foot checks daily;treat hypoglycemia with 15 grams of carbs if blood glucose less than 70mg /dL   Outcomes   Expected Outcomes Demonstrated interest in learning. Expect positive outcomes   Future DMSE 3-4 months   Program Status Completed      Individualized Plan for Diabetes Self-Management Training:   Learning Objective:  Patient will have a greater understanding of diabetes self-management. Patient education plan is to attend individual and/or group sessions per assessed needs and concerns.   Plan:   Patient Instructions  Plan:  Aim for 3 Carb Choices per meal (45 grams) +/- 1 either way  Aim for 0-1 Carbs per snack if hungry  Include protein in moderation with your meals and snacks Consider reading food labels for Total Carbohydrate and Fat Grams of foods Consider  increasing your activity level by walking or swimming for 30 minutes daily as tolerated Consider checking BG at alternate times per day as directed by MD  Consider taking medication as directed by MD Avoid  regular soda.    Expected Outcomes:  Demonstrated interest in learning. Expect positive outcomes  Education material provided: Living Well with Diabetes, Food label handouts, A1C conversion sheet, Meal plan card, My Plate and Snack sheet  If problems or questions, patient to contact team via:  Phone and Email  Future DSME appointment: 3-4 months

## 2014-10-16 NOTE — Patient Instructions (Addendum)
Plan:  Aim for 3 Carb Choices per meal (45 grams) +/- 1 either way  Aim for 0-1 Carbs per snack if hungry  Include protein in moderation with your meals and snacks Consider reading food labels for Total Carbohydrate and Fat Grams of foods Consider  increasing your activity level by walking or swimming for 30 minutes daily as tolerated Consider checking BG at alternate times per day as directed by MD  Consider taking medication as directed by MD Avoid regular soda.

## 2014-10-17 DIAGNOSIS — M797 Fibromyalgia: Secondary | ICD-10-CM | POA: Diagnosis not present

## 2014-10-17 DIAGNOSIS — I82409 Acute embolism and thrombosis of unspecified deep veins of unspecified lower extremity: Secondary | ICD-10-CM | POA: Diagnosis not present

## 2014-10-17 DIAGNOSIS — R109 Unspecified abdominal pain: Secondary | ICD-10-CM | POA: Diagnosis not present

## 2014-10-26 ENCOUNTER — Other Ambulatory Visit (HOSPITAL_COMMUNITY): Payer: Self-pay | Admitting: *Deleted

## 2014-10-27 ENCOUNTER — Ambulatory Visit: Payer: Medicare Other | Admitting: Podiatry

## 2014-10-27 ENCOUNTER — Encounter (HOSPITAL_COMMUNITY)
Admission: RE | Admit: 2014-10-27 | Discharge: 2014-10-27 | Disposition: A | Discharge status: Home / Self Care | Payer: Medicare Other | Source: Ambulatory Visit | Attending: Rheumatology | Admitting: Rheumatology

## 2014-10-27 DIAGNOSIS — M069 Rheumatoid arthritis, unspecified: Secondary | ICD-10-CM | POA: Diagnosis not present

## 2014-10-27 DIAGNOSIS — Z5181 Encounter for therapeutic drug level monitoring: Secondary | ICD-10-CM | POA: Diagnosis not present

## 2014-10-27 DIAGNOSIS — H2513 Age-related nuclear cataract, bilateral: Secondary | ICD-10-CM | POA: Diagnosis not present

## 2014-10-27 LAB — SEDIMENTATION RATE: SED RATE: 84 mm/h — AB (ref 0–22)

## 2014-10-27 MED ORDER — SODIUM CHLORIDE 0.9 % IV SOLN
750.0000 mg | INTRAVENOUS | Status: DC
  Administered 2014-10-27: 750 mg via INTRAVENOUS
  Filled 2014-10-27: qty 30

## 2014-10-27 MED ORDER — ACETAMINOPHEN 325 MG PO TABS
650.0000 mg | ORAL_TABLET | ORAL | Status: DC
  Administered 2014-10-27: 650 mg via ORAL

## 2014-10-27 MED ORDER — ACETAMINOPHEN 325 MG PO TABS
ORAL_TABLET | ORAL | Status: AC
  Filled 2014-10-27: qty 2

## 2014-10-27 MED ORDER — DIPHENHYDRAMINE HCL 25 MG PO CAPS
ORAL_CAPSULE | ORAL | Status: AC
  Administered 2014-10-27: 25 mg
  Filled 2014-10-27: qty 1

## 2014-10-27 MED ORDER — SODIUM CHLORIDE 0.9 % IV SOLN
INTRAVENOUS | Status: DC
  Administered 2014-10-27: 13:00:00 via INTRAVENOUS

## 2014-10-27 MED ORDER — DIPHENHYDRAMINE HCL 25 MG PO TABS
25.0000 mg | ORAL_TABLET | ORAL | Status: DC
  Filled 2014-10-27: qty 1

## 2014-10-30 DIAGNOSIS — R269 Unspecified abnormalities of gait and mobility: Secondary | ICD-10-CM | POA: Diagnosis not present

## 2014-10-30 DIAGNOSIS — M25561 Pain in right knee: Secondary | ICD-10-CM | POA: Diagnosis not present

## 2014-10-30 DIAGNOSIS — M1711 Unilateral primary osteoarthritis, right knee: Secondary | ICD-10-CM | POA: Diagnosis not present

## 2014-10-30 LAB — PROTEIN ELECTROPHORESIS, SERUM
A/G Ratio: 1.1 (ref 0.7–1.7)
ALPHA-1-GLOBULIN: 0.2 g/dL (ref 0.0–0.4)
Albumin ELP: 3.1 g/dL (ref 2.9–4.4)
Alpha-2-Globulin: 0.9 g/dL (ref 0.4–1.0)
Beta Globulin: 1 g/dL (ref 0.7–1.3)
GLOBULIN, TOTAL: 2.9 g/dL (ref 2.2–3.9)
Gamma Globulin: 0.8 g/dL (ref 0.4–1.8)
TOTAL PROTEIN ELP: 6 g/dL (ref 6.0–8.5)

## 2014-10-31 ENCOUNTER — Ambulatory Visit (INDEPENDENT_AMBULATORY_CARE_PROVIDER_SITE_OTHER): Payer: Medicare Other | Admitting: Podiatry

## 2014-10-31 ENCOUNTER — Encounter: Payer: Self-pay | Admitting: Podiatry

## 2014-10-31 DIAGNOSIS — B351 Tinea unguium: Secondary | ICD-10-CM | POA: Diagnosis not present

## 2014-10-31 DIAGNOSIS — M79676 Pain in unspecified toe(s): Secondary | ICD-10-CM | POA: Diagnosis not present

## 2014-10-31 DIAGNOSIS — L02611 Cutaneous abscess of right foot: Secondary | ICD-10-CM | POA: Diagnosis not present

## 2014-10-31 MED ORDER — CLINDAMYCIN HCL 300 MG PO CAPS: 300.0000 mg | ORAL_CAPSULE | Freq: Three times a day (TID) | ORAL | Status: DC

## 2014-11-01 NOTE — Progress Notes (Signed)
Patient ID: [name], female   DOB: May 02, 1940, 74 y.o.   MRN: [id number]  Subjective: 74 y.o. returns the office today for painful, elongated, thickened toenails and calluses. Denies any redness or drainage around the nails. Denies any acute changes since last appointment and no new complaints today. Denies any systemic complaints such as fevers, chills, nausea, vomiting.   Objective: AAO 3, NAD DP/PT pulses palpable, CRT less than 3 seconds Protective sensation decresed with Simms Weinstein monofilament Nails hypertrophic, dystrophic, elongated, brittle, discolored 10. There is tenderness overlying the nails 1-5 bilaterally. There is no surrounding erythema or drainage along the nail sites. There is hyperkeratotic lesion of the distal aspect of the right fourth toe. Upon debridement there was an area of superficial purulence expressed. After debridement there was a small superficial granular wound present however there is no probing, undermining, tunneling. There is no surrounding erythema, ascending cellulitis, fluctuance, crepitus, malodor. After debridement was no further purulence expressed. No open lesions or pre-ulcerative lesions are identified. No other areas of tenderness bilateral lower extremities. No overlying edema, erythema, increased warmth. No pain with calf compression, swelling, warmth, erythema.  Assessment: Patient presents with symptomatic onychomycosis  Plan: -Treatment options including alternatives, risks, complications were discussed -Nails sharply debrided 10 without complication/bleeding. -Hyperkeratotic lesion sharply debrided 1. Upon debridement there is underlying purulence. After debridement is no further purulence and there was a small wound however is superficial. Because of the localized infection will start clindamycin. This was prescribed today. Monitor for any clinical signs or symptoms of worsening infection and directed to call the office  immediately should any occur or go to the ER. -Discussed daily foot inspection. If there are any changes, to call the office immediately.  -Follow-up in 2 weeks or sooner if any problems are to arise. In the meantime, encouraged to call the office with any questions, concerns, changes symptoms.  Celesta Gentile, DPM

## 2014-11-03 ENCOUNTER — Ambulatory Visit
Admission: RE | Admit: 2014-11-03 | Discharge: 2014-11-03 | Disposition: A | Discharge status: Home / Self Care | Payer: Medicare Other | Source: Ambulatory Visit | Attending: Radiology | Admitting: Radiology

## 2014-11-03 ENCOUNTER — Other Ambulatory Visit: Payer: Self-pay | Admitting: Radiology

## 2014-11-03 DIAGNOSIS — M25512 Pain in left shoulder: Secondary | ICD-10-CM | POA: Diagnosis not present

## 2014-11-03 DIAGNOSIS — M25511 Pain in right shoulder: Secondary | ICD-10-CM

## 2014-11-10 DIAGNOSIS — M25511 Pain in right shoulder: Secondary | ICD-10-CM | POA: Diagnosis not present

## 2014-11-10 DIAGNOSIS — M19011 Primary osteoarthritis, right shoulder: Secondary | ICD-10-CM | POA: Diagnosis not present

## 2014-11-14 ENCOUNTER — Ambulatory Visit (INDEPENDENT_AMBULATORY_CARE_PROVIDER_SITE_OTHER): Payer: Medicare Other | Admitting: Podiatry

## 2014-11-14 ENCOUNTER — Ambulatory Visit (INDEPENDENT_AMBULATORY_CARE_PROVIDER_SITE_OTHER): Payer: Medicare Other

## 2014-11-14 ENCOUNTER — Ambulatory Visit: Payer: Medicare Other

## 2014-11-14 ENCOUNTER — Encounter: Payer: Self-pay | Admitting: Podiatry

## 2014-11-14 VITALS — BP 115/87 | HR 81 | Resp 18

## 2014-11-14 DIAGNOSIS — R52 Pain, unspecified: Secondary | ICD-10-CM

## 2014-11-14 DIAGNOSIS — M25471 Effusion, right ankle: Secondary | ICD-10-CM | POA: Diagnosis not present

## 2014-11-14 DIAGNOSIS — M79661 Pain in right lower leg: Secondary | ICD-10-CM | POA: Diagnosis not present

## 2014-11-14 DIAGNOSIS — M19071 Primary osteoarthritis, right ankle and foot: Secondary | ICD-10-CM | POA: Diagnosis not present

## 2014-11-14 MED ORDER — TRAMADOL HCL 50 MG PO TABS: 50.0000 mg | ORAL_TABLET | Freq: Three times a day (TID) | ORAL | Status: DC | PRN

## 2014-11-14 NOTE — Patient Instructions (Signed)
We will order an ultrasound of your right leg to make sure you do not have a blood clot Wear ankle brace as needed Monitor for any further skin breakdown or any problems Finish course of antibiotics

## 2014-11-15 NOTE — Progress Notes (Signed)
Patient ID: [name], female   DOB: December 13, 1940, 74 y.o.   MRN: [id number]  Subjective: 74 y.o. returns the office today for follow-up evaluation of localized infection to the right 4th toe. She states that she is continuing with her antibiotics. She states that she believes that the wound is healed and she has not has any swelling or redness or any drainage complications to the areas healed and she is doing well. She does that she has started to develop some ankle pain since I last saw her on the right side. Was repaired after prolonged periods of ambulation or weightbearing. She denies any recent injury or trauma. She gets occasional swelling to her ankle and she is also redness and swelling to her leg. She denies any cramping to the leg.  Denies any acute changes since last appointment and no new complaints today. Denies any systemic complaints such as fevers, chills, nausea, vomiting.   Objective: AAO 3, NAD DP/PT pulses palpable, CRT less than 3 seconds Protective sensation decresed with Simms Weinstein monofilament At this time there is no hyperkeratotic tissue to the distal aspect of the right fourth toe and there is no clinical signs of infection. There is no edema, erythema, increase in warmth, drainage, malodor. There is no other open lesions or pre-ulcerative lesions identified at this time. There is mild discomfort along the medial aspect of the right ankle and to the anteromedial aspect of the ankle gutter. There is no specific area pinpoint bony tenderness there is no pain vibratory sensation. There is no pain with ankle joint range of motion. There is edema to the ankle into the leg and there is mild Pain with calf compression. There is no scissoring erythema or increase in warmth to the calf. There is no other areas of tenderness to bilateral lower extremities.   Assessment: Healed localized infection right fourth toe with right ankle pain/calf pain today.  Plan: -X-rays were  obtained and reviewed with the patient.  -Treatment options including alternatives, risks, complications were discussed -At this time it appears that the infection right fourth toe is resolved. There is no clinical signs of infection this time. Monitor for any reoccurrence or any future problems. They changes to call the office immediately. -Ankle pain likely result of osteoarthritis. Also do the calf pain will order a venous duplex to rule out a blood clot. I did prescribe Ultram to take as needed for pain. Ankle brace was also dispensed today. Elevation. -Follow-up as schedule or sooner if any problems arise. In the meantime, encouraged to call the office with any questions, concerns, change in symptoms.   Celesta Gentile, DPM

## 2014-11-17 DIAGNOSIS — M25511 Pain in right shoulder: Secondary | ICD-10-CM | POA: Diagnosis not present

## 2014-11-17 DIAGNOSIS — M25512 Pain in left shoulder: Secondary | ICD-10-CM | POA: Diagnosis not present

## 2014-11-17 DIAGNOSIS — M19011 Primary osteoarthritis, right shoulder: Secondary | ICD-10-CM | POA: Diagnosis not present

## 2014-11-20 DIAGNOSIS — H2513 Age-related nuclear cataract, bilateral: Secondary | ICD-10-CM | POA: Diagnosis not present

## 2014-11-20 DIAGNOSIS — H40023 Open angle with borderline findings, high risk, bilateral: Secondary | ICD-10-CM | POA: Diagnosis not present

## 2014-11-21 ENCOUNTER — Ambulatory Visit (HOSPITAL_COMMUNITY)
Admission: RE | Admit: 2014-11-21 | Discharge: 2014-11-21 | Disposition: A | Discharge status: Home / Self Care | Payer: Medicare Other | Source: Ambulatory Visit | Attending: Cardiovascular Disease | Admitting: Cardiovascular Disease

## 2014-11-21 DIAGNOSIS — M7989 Other specified soft tissue disorders: Secondary | ICD-10-CM | POA: Insufficient documentation

## 2014-11-21 DIAGNOSIS — I129 Hypertensive chronic kidney disease with stage 1 through stage 4 chronic kidney disease, or unspecified chronic kidney disease: Secondary | ICD-10-CM | POA: Diagnosis not present

## 2014-11-21 DIAGNOSIS — N183 Chronic kidney disease, stage 3 (moderate): Secondary | ICD-10-CM | POA: Insufficient documentation

## 2014-11-21 DIAGNOSIS — E119 Type 2 diabetes mellitus without complications: Secondary | ICD-10-CM | POA: Insufficient documentation

## 2014-11-21 DIAGNOSIS — R52 Pain, unspecified: Secondary | ICD-10-CM | POA: Diagnosis not present

## 2014-11-21 DIAGNOSIS — M79661 Pain in right lower leg: Secondary | ICD-10-CM | POA: Insufficient documentation

## 2014-11-21 DIAGNOSIS — E7800 Pure hypercholesterolemia, unspecified: Secondary | ICD-10-CM | POA: Insufficient documentation

## 2014-11-23 ENCOUNTER — Telehealth: Payer: Self-pay | Admitting: *Deleted

## 2014-11-23 ENCOUNTER — Other Ambulatory Visit (HOSPITAL_COMMUNITY): Payer: Self-pay

## 2014-11-23 NOTE — Telephone Encounter (Addendum)
-----   Message from Trula Slade, DPM sent at 11/22/2014  6:57 PM EST ----- Can you please let her know that her venous study was normal, no blood clot. Thanks. Informed pt of results.

## 2014-11-24 ENCOUNTER — Encounter (HOSPITAL_COMMUNITY)
Admission: RE | Admit: 2014-11-24 | Discharge: 2014-11-24 | Disposition: A | Discharge status: Home / Self Care | Payer: Medicare Other | Source: Ambulatory Visit | Attending: Rheumatology | Admitting: Rheumatology

## 2014-11-24 DIAGNOSIS — Z5181 Encounter for therapeutic drug level monitoring: Secondary | ICD-10-CM | POA: Diagnosis not present

## 2014-11-24 DIAGNOSIS — M069 Rheumatoid arthritis, unspecified: Secondary | ICD-10-CM | POA: Diagnosis not present

## 2014-11-24 LAB — CBC WITH DIFFERENTIAL/PLATELET
BASOS PCT: 0 %
Basophils Absolute: 0 10*3/uL (ref 0.0–0.1)
EOS ABS: 0.1 10*3/uL (ref 0.0–0.7)
Eosinophils Relative: 2 %
HCT: 32.2 % — ABNORMAL LOW (ref 36.0–46.0)
HEMOGLOBIN: 10.5 g/dL — AB (ref 12.0–15.0)
Lymphocytes Relative: 35 %
Lymphs Abs: 2.8 10*3/uL (ref 0.7–4.0)
MCH: 27.2 pg (ref 26.0–34.0)
MCHC: 32.6 g/dL (ref 30.0–36.0)
MCV: 83.4 fL (ref 78.0–100.0)
MONO ABS: 0.5 10*3/uL (ref 0.1–1.0)
MONOS PCT: 6 %
NEUTROS PCT: 57 %
Neutro Abs: 4.6 10*3/uL (ref 1.7–7.7)
Platelets: 230 10*3/uL (ref 150–400)
RBC: 3.86 MIL/uL — ABNORMAL LOW (ref 3.87–5.11)
RDW: 15.9 % — AB (ref 11.5–15.5)
WBC: 8 10*3/uL (ref 4.0–10.5)

## 2014-11-24 LAB — COMPREHENSIVE METABOLIC PANEL
ALBUMIN: 3.7 g/dL (ref 3.5–5.0)
ALT: 16 U/L (ref 14–54)
ANION GAP: 9 (ref 5–15)
AST: 22 U/L (ref 15–41)
Alkaline Phosphatase: 73 U/L (ref 38–126)
BUN: 26 mg/dL — ABNORMAL HIGH (ref 6–20)
CO2: 24 mmol/L (ref 22–32)
Calcium: 9.5 mg/dL (ref 8.9–10.3)
Chloride: 108 mmol/L (ref 101–111)
Creatinine, Ser: 1.21 mg/dL — ABNORMAL HIGH (ref 0.44–1.00)
GFR calc Af Amer: 49 mL/min — ABNORMAL LOW (ref >60)
GFR calc non Af Amer: 43 mL/min — ABNORMAL LOW (ref >60)
GLUCOSE: 159 mg/dL — AB (ref 65–99)
POTASSIUM: 4.6 mmol/L (ref 3.5–5.1)
SODIUM: 141 mmol/L (ref 135–145)
Total Bilirubin: 0.6 mg/dL (ref 0.3–1.2)
Total Protein: 7.3 g/dL (ref 6.5–8.1)

## 2014-11-24 MED ORDER — DIPHENHYDRAMINE HCL 25 MG PO CAPS
ORAL_CAPSULE | ORAL | Status: AC
  Filled 2014-11-24: qty 1

## 2014-11-24 MED ORDER — SODIUM CHLORIDE 0.9 % IV SOLN
750.0000 mg | INTRAVENOUS | Status: DC
  Administered 2014-11-24: 750 mg via INTRAVENOUS
  Filled 2014-11-24: qty 30

## 2014-11-24 MED ORDER — DIPHENHYDRAMINE HCL 25 MG PO CAPS
25.0000 mg | ORAL_CAPSULE | Freq: Once | ORAL | Status: AC
Start: 2014-11-24 — End: 2014-11-24
  Administered 2014-11-24: 25 mg via ORAL

## 2014-11-24 MED ORDER — ACETAMINOPHEN 325 MG PO TABS: 650.0000 mg | ORAL_TABLET | Freq: Once | ORAL | Status: DC

## 2014-11-27 DIAGNOSIS — L304 Erythema intertrigo: Secondary | ICD-10-CM | POA: Diagnosis not present

## 2014-11-28 ENCOUNTER — Other Ambulatory Visit: Payer: Self-pay | Admitting: Podiatry

## 2014-11-29 ENCOUNTER — Telehealth: Payer: Self-pay | Admitting: *Deleted

## 2014-11-29 NOTE — Telephone Encounter (Signed)
Refill Tramadol 50 mg #20 one tablet every 8 hours okayed by Dr. Jacqualyn Posey, called to [place].

## 2014-12-20 DIAGNOSIS — M0579 Rheumatoid arthritis with rheumatoid factor of multiple sites without organ or systems involvement: Secondary | ICD-10-CM | POA: Diagnosis not present

## 2014-12-20 DIAGNOSIS — Z79899 Other long term (current) drug therapy: Secondary | ICD-10-CM | POA: Diagnosis not present

## 2014-12-20 DIAGNOSIS — M79671 Pain in right foot: Secondary | ICD-10-CM | POA: Diagnosis not present

## 2014-12-20 DIAGNOSIS — M79641 Pain in right hand: Secondary | ICD-10-CM | POA: Diagnosis not present

## 2014-12-20 DIAGNOSIS — M1A00X0 Idiopathic chronic gout, unspecified site, without tophus (tophi): Secondary | ICD-10-CM | POA: Diagnosis not present

## 2014-12-20 DIAGNOSIS — M79642 Pain in left hand: Secondary | ICD-10-CM | POA: Diagnosis not present

## 2014-12-20 DIAGNOSIS — M79672 Pain in left foot: Secondary | ICD-10-CM | POA: Diagnosis not present

## 2014-12-20 DIAGNOSIS — M4056 Lordosis, unspecified, lumbar region: Secondary | ICD-10-CM | POA: Diagnosis not present

## 2014-12-22 ENCOUNTER — Other Ambulatory Visit (HOSPITAL_COMMUNITY): Payer: Self-pay | Admitting: *Deleted

## 2014-12-22 ENCOUNTER — Encounter (HOSPITAL_COMMUNITY): Payer: Medicare Other

## 2014-12-25 ENCOUNTER — Encounter (HOSPITAL_COMMUNITY)
Admission: RE | Admit: 2014-12-25 | Discharge: 2014-12-25 | Disposition: A | Discharge status: Home / Self Care | Payer: Medicare Other | Source: Ambulatory Visit | Attending: Rheumatology | Admitting: Rheumatology

## 2014-12-25 DIAGNOSIS — M069 Rheumatoid arthritis, unspecified: Secondary | ICD-10-CM | POA: Diagnosis not present

## 2014-12-25 DIAGNOSIS — Z5181 Encounter for therapeutic drug level monitoring: Secondary | ICD-10-CM | POA: Insufficient documentation

## 2014-12-25 MED ORDER — SODIUM CHLORIDE 0.9 % IV SOLN
INTRAVENOUS | Status: DC
  Administered 2014-12-25: 13:00:00 via INTRAVENOUS

## 2014-12-25 MED ORDER — SODIUM CHLORIDE 0.9 % IV SOLN
750.0000 mg | INTRAVENOUS | Status: DC
  Administered 2014-12-25: 750 mg via INTRAVENOUS
  Filled 2014-12-25: qty 30

## 2014-12-25 MED ORDER — ACETAMINOPHEN 325 MG PO TABS: 650.0000 mg | ORAL_TABLET | ORAL | Status: DC

## 2014-12-25 MED ORDER — DIPHENHYDRAMINE HCL 25 MG PO CAPS: 25.0000 mg | ORAL_CAPSULE | Freq: Once | ORAL | Status: DC

## 2014-12-26 DIAGNOSIS — M170 Bilateral primary osteoarthritis of knee: Secondary | ICD-10-CM | POA: Diagnosis not present

## 2014-12-26 DIAGNOSIS — M25561 Pain in right knee: Secondary | ICD-10-CM | POA: Diagnosis not present

## 2014-12-26 DIAGNOSIS — M1711 Unilateral primary osteoarthritis, right knee: Secondary | ICD-10-CM | POA: Diagnosis not present

## 2014-12-26 DIAGNOSIS — R262 Difficulty in walking, not elsewhere classified: Secondary | ICD-10-CM | POA: Diagnosis not present

## 2015-01-09 DIAGNOSIS — M25561 Pain in right knee: Secondary | ICD-10-CM | POA: Diagnosis not present

## 2015-01-09 DIAGNOSIS — R2689 Other abnormalities of gait and mobility: Secondary | ICD-10-CM | POA: Diagnosis not present

## 2015-01-09 DIAGNOSIS — M1711 Unilateral primary osteoarthritis, right knee: Secondary | ICD-10-CM | POA: Diagnosis not present

## 2015-01-09 DIAGNOSIS — M25662 Stiffness of left knee, not elsewhere classified: Secondary | ICD-10-CM | POA: Diagnosis not present

## 2015-01-15 ENCOUNTER — Ambulatory Visit: Payer: Medicare Other | Admitting: Dietician

## 2015-01-16 ENCOUNTER — Encounter: Payer: Self-pay | Admitting: Podiatry

## 2015-01-16 ENCOUNTER — Ambulatory Visit (INDEPENDENT_AMBULATORY_CARE_PROVIDER_SITE_OTHER): Payer: Medicare Other | Admitting: Podiatry

## 2015-01-16 DIAGNOSIS — Z794 Long term (current) use of insulin: Secondary | ICD-10-CM

## 2015-01-16 DIAGNOSIS — L84 Corns and callosities: Secondary | ICD-10-CM | POA: Diagnosis not present

## 2015-01-16 DIAGNOSIS — M19071 Primary osteoarthritis, right ankle and foot: Secondary | ICD-10-CM | POA: Diagnosis not present

## 2015-01-16 DIAGNOSIS — M79676 Pain in unspecified toe(s): Secondary | ICD-10-CM

## 2015-01-16 DIAGNOSIS — M2142 Flat foot [pes planus] (acquired), left foot: Secondary | ICD-10-CM

## 2015-01-16 DIAGNOSIS — E1140 Type 2 diabetes mellitus with diabetic neuropathy, unspecified: Secondary | ICD-10-CM | POA: Diagnosis not present

## 2015-01-16 DIAGNOSIS — B351 Tinea unguium: Secondary | ICD-10-CM

## 2015-01-16 DIAGNOSIS — M2141 Flat foot [pes planus] (acquired), right foot: Secondary | ICD-10-CM

## 2015-01-16 DIAGNOSIS — M2010 Hallux valgus (acquired), unspecified foot: Secondary | ICD-10-CM

## 2015-01-16 DIAGNOSIS — M2040 Other hammer toe(s) (acquired), unspecified foot: Secondary | ICD-10-CM

## 2015-01-16 NOTE — Progress Notes (Signed)
Patient ID: [name], female   DOB: November 10, 1940, 75 y.o.   MRN: [id number]  Subjective: 75 y.o. returns the office today for painful, elongated, thickened toenails which she cannot trim herself. Denies any redness or drainage around the nails. She gets some occasional pain to her right ankle from the arthritis but improved. She has developed a small bunion and hammertoes. She gets a callus on the ball of both feet. No redness or drainage. Denies any acute changes since last appointment and no new complaints today. Denies any systemic complaints such as fevers, chills, nausea, vomiting.   Objective: AAO 3, NAD DP/PT pulses palpable, CRT less than 3 seconds Protective sensation decreased with Simms Weinstein monofilament Nails hypertrophic, dystrophic, elongated, brittle, discolored 10. There is tenderness overlying the nails 1-5 bilaterally. There is no surrounding erythema or drainage along the nail sites. Submetatarsal hyperkerotic lesions bilaterally. Upon debridement, no underlying ulceration, drainage, or other signs of infection.  No open lesions or pre-ulcerative lesions are identified. HAV and hammertoes bilaterally. Flatfoot bilaterally. Equinus present. She does get some intermittent pain along the achilles tendon but currently no symptoms. Tendon intact.  No other areas of tenderness bilateral lower extremities. No overlying edema, erythema, increased warmth. No pain with calf compression, swelling, warmth, erythema.  Assessment: Patient presents with symptomatic onychomycosis  Plan: -Treatment options including alternatives, risks, complications were discussed -Nails sharply debrided 10 without complication/bleeding. -Hyperkerotic lesions debrided x 2 without complications or bleeding.  -Completed paperwork for pre-certification for diabetic shoes.  -Achilles stretches.  -Discussed daily foot inspection. If there are any changes, to call the office immediately.   -Follow-up in 3 months or sooner if any problems are to arise. Follow-up sooner if any worsening of symptoms.  In the meantime, encouraged to call the office with any questions, concerns, changes symptoms.  Celesta Gentile, DPM

## 2015-01-19 DIAGNOSIS — R2689 Other abnormalities of gait and mobility: Secondary | ICD-10-CM | POA: Diagnosis not present

## 2015-01-19 DIAGNOSIS — M25561 Pain in right knee: Secondary | ICD-10-CM | POA: Diagnosis not present

## 2015-01-19 DIAGNOSIS — M1711 Unilateral primary osteoarthritis, right knee: Secondary | ICD-10-CM | POA: Diagnosis not present

## 2015-01-22 ENCOUNTER — Encounter (HOSPITAL_COMMUNITY): Payer: Medicare Other

## 2015-01-23 DIAGNOSIS — M25531 Pain in right wrist: Secondary | ICD-10-CM | POA: Diagnosis not present

## 2015-01-23 DIAGNOSIS — M0579 Rheumatoid arthritis with rheumatoid factor of multiple sites without organ or systems involvement: Secondary | ICD-10-CM | POA: Diagnosis not present

## 2015-01-23 DIAGNOSIS — M25561 Pain in right knee: Secondary | ICD-10-CM | POA: Diagnosis not present

## 2015-01-23 DIAGNOSIS — Z79899 Other long term (current) drug therapy: Secondary | ICD-10-CM | POA: Diagnosis not present

## 2015-01-25 ENCOUNTER — Other Ambulatory Visit (HOSPITAL_COMMUNITY): Payer: Self-pay | Admitting: *Deleted

## 2015-01-25 DIAGNOSIS — M25561 Pain in right knee: Secondary | ICD-10-CM | POA: Diagnosis not present

## 2015-01-25 DIAGNOSIS — M1711 Unilateral primary osteoarthritis, right knee: Secondary | ICD-10-CM | POA: Diagnosis not present

## 2015-01-25 DIAGNOSIS — R2689 Other abnormalities of gait and mobility: Secondary | ICD-10-CM | POA: Diagnosis not present

## 2015-01-26 ENCOUNTER — Encounter (HOSPITAL_COMMUNITY)
Admission: RE | Admit: 2015-01-26 | Discharge: 2015-01-26 | Disposition: A | Discharge status: Home / Self Care | Payer: Medicare Other | Source: Ambulatory Visit | Attending: Rheumatology | Admitting: Rheumatology

## 2015-01-26 DIAGNOSIS — M069 Rheumatoid arthritis, unspecified: Secondary | ICD-10-CM | POA: Diagnosis not present

## 2015-01-26 LAB — COMPREHENSIVE METABOLIC PANEL
ALT: 18 U/L (ref 14–54)
ANION GAP: 11 (ref 5–15)
AST: 31 U/L (ref 15–41)
Albumin: 3.7 g/dL (ref 3.5–5.0)
Alkaline Phosphatase: 89 U/L (ref 38–126)
BUN: 17 mg/dL (ref 6–20)
CHLORIDE: 105 mmol/L (ref 101–111)
CO2: 25 mmol/L (ref 22–32)
CREATININE: 1.13 mg/dL — AB (ref 0.44–1.00)
Calcium: 9.9 mg/dL (ref 8.9–10.3)
GFR calc non Af Amer: 46 mL/min — ABNORMAL LOW (ref >60)
GFR, EST AFRICAN AMERICAN: 53 mL/min — AB (ref >60)
Glucose, Bld: 114 mg/dL — ABNORMAL HIGH (ref 65–99)
SODIUM: 141 mmol/L (ref 135–145)
Total Bilirubin: 1 mg/dL (ref 0.3–1.2)
Total Protein: 7 g/dL (ref 6.5–8.1)

## 2015-01-26 LAB — DIFFERENTIAL
BASOS ABS: 0 10*3/uL (ref 0.0–0.1)
Basophils Relative: 0 %
Eosinophils Absolute: 0.2 10*3/uL (ref 0.0–0.7)
Eosinophils Relative: 2 %
LYMPHS ABS: 3.6 10*3/uL (ref 0.7–4.0)
Lymphocytes Relative: 45 %
MONOS PCT: 6 %
Monocytes Absolute: 0.5 10*3/uL (ref 0.1–1.0)
NEUTROS ABS: 3.7 10*3/uL (ref 1.7–7.7)
Neutrophils Relative %: 47 %

## 2015-01-26 LAB — CBC
HCT: 30.4 % — ABNORMAL LOW (ref 36.0–46.0)
Hemoglobin: 10.2 g/dL — ABNORMAL LOW (ref 12.0–15.0)
MCH: 27.9 pg (ref 26.0–34.0)
MCHC: 33.6 g/dL (ref 30.0–36.0)
MCV: 83.1 fL (ref 78.0–100.0)
PLATELETS: 323 10*3/uL (ref 150–400)
RBC: 3.66 MIL/uL — AB (ref 3.87–5.11)
RDW: 15.6 % — ABNORMAL HIGH (ref 11.5–15.5)
WBC: 8 10*3/uL (ref 4.0–10.5)

## 2015-01-26 LAB — SEDIMENTATION RATE: Sed Rate: 84 mm/hr — ABNORMAL HIGH (ref 0–22)

## 2015-01-26 LAB — URIC ACID: Uric Acid, Serum: 6.7 mg/dL — ABNORMAL HIGH (ref 2.3–6.6)

## 2015-01-26 MED ORDER — DIPHENHYDRAMINE HCL 25 MG PO CAPS
ORAL_CAPSULE | ORAL | Status: AC
  Filled 2015-01-26: qty 1

## 2015-01-26 MED ORDER — ACETAMINOPHEN 325 MG PO TABS
ORAL_TABLET | ORAL | Status: AC
  Filled 2015-01-26: qty 2

## 2015-01-26 MED ORDER — ACETAMINOPHEN 325 MG PO TABS
650.0000 mg | ORAL_TABLET | ORAL | Status: DC
  Administered 2015-01-26: 650 mg via ORAL

## 2015-01-26 MED ORDER — SODIUM CHLORIDE 0.9 % IV SOLN
750.0000 mg | INTRAVENOUS | Status: DC
  Administered 2015-01-26: 750 mg via INTRAVENOUS
  Filled 2015-01-26: qty 30

## 2015-01-26 MED ORDER — DIPHENHYDRAMINE HCL 25 MG PO TABS
25.0000 mg | ORAL_TABLET | ORAL | Status: DC
  Administered 2015-01-26: 25 mg via ORAL
  Filled 2015-01-26: qty 1

## 2015-01-26 MED ORDER — SODIUM CHLORIDE 0.9 % IV SOLN
INTRAVENOUS | Status: DC
  Administered 2015-01-26: 14:00:00 via INTRAVENOUS

## 2015-01-27 LAB — RHEUMATOID FACTOR: Rhuematoid fact SerPl-aCnc: <10 IU/mL (ref 0.0–13.9)

## 2015-01-28 LAB — CYCLIC CITRUL PEPTIDE ANTIBODY, IGG/IGA: CCP Antibodies IgG/IgA: 9 units (ref 0–19)

## 2015-01-31 DIAGNOSIS — R2689 Other abnormalities of gait and mobility: Secondary | ICD-10-CM | POA: Diagnosis not present

## 2015-01-31 DIAGNOSIS — M25561 Pain in right knee: Secondary | ICD-10-CM | POA: Diagnosis not present

## 2015-01-31 DIAGNOSIS — M1711 Unilateral primary osteoarthritis, right knee: Secondary | ICD-10-CM | POA: Diagnosis not present

## 2015-02-02 DIAGNOSIS — M4810 Ankylosing hyperostosis [Forestier], site unspecified: Secondary | ICD-10-CM | POA: Diagnosis not present

## 2015-02-02 DIAGNOSIS — M5127 Other intervertebral disc displacement, lumbosacral region: Secondary | ICD-10-CM | POA: Diagnosis not present

## 2015-02-05 DIAGNOSIS — Z1231 Encounter for screening mammogram for malignant neoplasm of breast: Secondary | ICD-10-CM | POA: Diagnosis not present

## 2015-02-07 DIAGNOSIS — M25561 Pain in right knee: Secondary | ICD-10-CM | POA: Diagnosis not present

## 2015-02-07 DIAGNOSIS — R2689 Other abnormalities of gait and mobility: Secondary | ICD-10-CM | POA: Diagnosis not present

## 2015-02-07 DIAGNOSIS — M1711 Unilateral primary osteoarthritis, right knee: Secondary | ICD-10-CM | POA: Diagnosis not present

## 2015-02-09 ENCOUNTER — Other Ambulatory Visit: Payer: Self-pay | Admitting: Family Medicine

## 2015-02-09 DIAGNOSIS — R918 Other nonspecific abnormal finding of lung field: Secondary | ICD-10-CM

## 2015-02-12 DIAGNOSIS — J209 Acute bronchitis, unspecified: Secondary | ICD-10-CM | POA: Diagnosis not present

## 2015-02-14 ENCOUNTER — Ambulatory Visit
Admission: RE | Admit: 2015-02-14 | Discharge: 2015-02-14 | Disposition: A | Discharge status: Home / Self Care | Payer: Medicare Other | Source: Ambulatory Visit | Attending: Family Medicine | Admitting: Family Medicine

## 2015-02-14 DIAGNOSIS — M0579 Rheumatoid arthritis with rheumatoid factor of multiple sites without organ or systems involvement: Secondary | ICD-10-CM | POA: Diagnosis not present

## 2015-02-14 DIAGNOSIS — R911 Solitary pulmonary nodule: Secondary | ICD-10-CM | POA: Diagnosis not present

## 2015-02-14 DIAGNOSIS — M25521 Pain in right elbow: Secondary | ICD-10-CM | POA: Diagnosis not present

## 2015-02-14 DIAGNOSIS — M25561 Pain in right knee: Secondary | ICD-10-CM | POA: Diagnosis not present

## 2015-02-14 DIAGNOSIS — R918 Other nonspecific abnormal finding of lung field: Secondary | ICD-10-CM

## 2015-02-14 DIAGNOSIS — M1711 Unilateral primary osteoarthritis, right knee: Secondary | ICD-10-CM | POA: Diagnosis not present

## 2015-02-14 DIAGNOSIS — R2689 Other abnormalities of gait and mobility: Secondary | ICD-10-CM | POA: Diagnosis not present

## 2015-02-14 DIAGNOSIS — Z09 Encounter for follow-up examination after completed treatment for conditions other than malignant neoplasm: Secondary | ICD-10-CM | POA: Diagnosis not present

## 2015-02-18 DIAGNOSIS — K061 Gingival enlargement: Secondary | ICD-10-CM | POA: Diagnosis not present

## 2015-02-18 DIAGNOSIS — R220 Localized swelling, mass and lump, head: Secondary | ICD-10-CM | POA: Diagnosis not present

## 2015-02-23 ENCOUNTER — Encounter (HOSPITAL_COMMUNITY): Payer: Medicare Other

## 2015-02-26 ENCOUNTER — Ambulatory Visit: Payer: Medicare Other | Admitting: *Deleted

## 2015-02-26 DIAGNOSIS — E1140 Type 2 diabetes mellitus with diabetic neuropathy, unspecified: Secondary | ICD-10-CM

## 2015-02-26 DIAGNOSIS — Z794 Long term (current) use of insulin: Principal | ICD-10-CM

## 2015-02-27 DIAGNOSIS — R2689 Other abnormalities of gait and mobility: Secondary | ICD-10-CM | POA: Diagnosis not present

## 2015-02-27 DIAGNOSIS — M1711 Unilateral primary osteoarthritis, right knee: Secondary | ICD-10-CM | POA: Diagnosis not present

## 2015-02-27 DIAGNOSIS — M25561 Pain in right knee: Secondary | ICD-10-CM | POA: Diagnosis not present

## 2015-02-27 NOTE — Progress Notes (Signed)
Patient ID: [name], female   DOB: Sep 12, 1940, 75 y.o.   MRN: [id number] Patient presents for orthotic pick up.  Verbal and written break in and wear instructions given.  Patient will follow up in 4 weeks if symptoms worsen or fail to improve.

## 2015-03-05 DIAGNOSIS — K0510 Chronic gingivitis, plaque induced: Secondary | ICD-10-CM | POA: Diagnosis not present

## 2015-03-05 DIAGNOSIS — I10 Essential (primary) hypertension: Secondary | ICD-10-CM | POA: Diagnosis not present

## 2015-03-06 DIAGNOSIS — M1711 Unilateral primary osteoarthritis, right knee: Secondary | ICD-10-CM | POA: Diagnosis not present

## 2015-03-06 DIAGNOSIS — M25561 Pain in right knee: Secondary | ICD-10-CM | POA: Diagnosis not present

## 2015-03-06 DIAGNOSIS — R2689 Other abnormalities of gait and mobility: Secondary | ICD-10-CM | POA: Diagnosis not present

## 2015-03-12 DIAGNOSIS — R2689 Other abnormalities of gait and mobility: Secondary | ICD-10-CM | POA: Diagnosis not present

## 2015-03-12 DIAGNOSIS — N644 Mastodynia: Secondary | ICD-10-CM | POA: Diagnosis not present

## 2015-03-12 DIAGNOSIS — R32 Unspecified urinary incontinence: Secondary | ICD-10-CM | POA: Diagnosis not present

## 2015-03-12 DIAGNOSIS — M1711 Unilateral primary osteoarthritis, right knee: Secondary | ICD-10-CM | POA: Diagnosis not present

## 2015-03-12 DIAGNOSIS — M25561 Pain in right knee: Secondary | ICD-10-CM | POA: Diagnosis not present

## 2015-03-13 ENCOUNTER — Encounter: Payer: Self-pay | Admitting: Sports Medicine

## 2015-03-13 ENCOUNTER — Ambulatory Visit (INDEPENDENT_AMBULATORY_CARE_PROVIDER_SITE_OTHER): Payer: Medicare Other | Admitting: Sports Medicine

## 2015-03-13 DIAGNOSIS — E1140 Type 2 diabetes mellitus with diabetic neuropathy, unspecified: Secondary | ICD-10-CM

## 2015-03-13 DIAGNOSIS — M2142 Flat foot [pes planus] (acquired), left foot: Secondary | ICD-10-CM

## 2015-03-13 DIAGNOSIS — M79676 Pain in unspecified toe(s): Secondary | ICD-10-CM | POA: Diagnosis not present

## 2015-03-13 DIAGNOSIS — B351 Tinea unguium: Secondary | ICD-10-CM | POA: Diagnosis not present

## 2015-03-13 DIAGNOSIS — M2141 Flat foot [pes planus] (acquired), right foot: Secondary | ICD-10-CM

## 2015-03-13 DIAGNOSIS — Z794 Long term (current) use of insulin: Secondary | ICD-10-CM

## 2015-03-13 DIAGNOSIS — M2010 Hallux valgus (acquired), unspecified foot: Secondary | ICD-10-CM

## 2015-03-13 DIAGNOSIS — M2040 Other hammer toe(s) (acquired), unspecified foot: Secondary | ICD-10-CM

## 2015-03-13 NOTE — Progress Notes (Signed)
Patient ID: [name], female   DOB: 1940-10-17, 76 y.o.   MRN: [id number]   Subjective: [name] is a 75 y.o. female patient with history of type 2 diabetes who presents to office today complaining of long, painful nails  while ambulating in shoes; unable to trim; states that she feels like she has a nail that is starting to ingrow at left 2nd toe and right 4th toe. Patient states that the glucose reading this morning was not recorded. Patient denies any new changes in medication or new problems. Patient denies any new cramping, numbness, burning or tingling in the legs.  Patient Active Problem List   Diagnosis Date Noted  . Hypoglycemia 08/22/2014  . Neuropathy ([place]) 08/22/2014  . Hypothyroidism 08/22/2014  . Chronic pain 08/22/2014  . 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 Chest pain 08/05/2013  . Aortic stenosis 12/22/2012  . Hyperlipidemia 12/22/2012  . PVC (premature ventricular contraction) 12/22/2012  . HTN (hypertension) 12/22/2012   Current Outpatient Prescriptions on File Prior to Visit  Medication Sig Dispense Refill  . Abatacept (ORENCIA IV) Inject into the vein every 30 (thirty) days.     Marland Kitchen allopurinol (ZYLOPRIM) 300 MG tablet Take 300 mg by mouth daily.    Marland Kitchen atorvastatin (LIPITOR) 40 MG tablet Take 40 mg by mouth daily.    . cholecalciferol (VITAMIN D) 1000 UNITS tablet Take 1,000 Units by mouth daily.     . clindamycin (CLEOCIN) 300 MG capsule Take 1 capsule (300 mg total) by mouth 3 (three) times daily. 30 capsule 2  . cyclobenzaprine (FLEXERIL) 10 MG tablet Take 10 mg by mouth 3 (three) times daily as needed for muscle spasms.     . diclofenac sodium (VOLTAREN) 1 % GEL Apply 2 g topically 4 (four) times daily as needed (pain). Rub into affected area of foot 2 to 4 times daily    . ferrous fumarate (HEMOCYTE - 106 MG FE) 325 (106 FE) MG TABS Take 1 tablet by mouth daily.     . folic acid  (FOLVITE) 1 MG tablet Take 1 mg by mouth daily.    Marland Kitchen gabapentin (NEURONTIN) 100 MG capsule Take 100 mg by mouth 3 (three) times daily as needed (pain).     . hydrALAZINE (APRESOLINE) 10 MG tablet Take 1 tablet (10 mg total) by mouth every 8 (eight) hours. 90 tablet 1  . HYDROcodone-acetaminophen (NORCO/VICODIN) 5-325 MG per tablet Take 1-2 tablets by mouth every 3 (three) hours as needed for moderate pain. 30 tablet 0  . levothyroxine (SYNTHROID, LEVOTHROID) 200 MCG tablet Take 200 mcg by mouth daily before breakfast.    . losartan (COZAAR) 50 MG tablet Take 50 mg by mouth daily.     . multivitamin (THERAGRAN) per tablet Take 1 tablet by mouth at bedtime.     Marland Kitchen NOVOLIN 70/30 RELION (70-30) 100 UNIT/ML injection Inject 2 Units into the skin 2 (two) times daily. For high blood sugar (Patient taking differently: Inject 8 Units into the skin daily with supper. For high blood sugar) 10 mL 11  . predniSONE (STERAPRED UNI-PAK 21 TAB) 10 MG (21) TBPK tablet Take 10 mg by mouth daily.    . pregabalin (LYRICA) 75 MG capsule Take 75-150 mg by mouth 2 (two) times daily. Take 1 capsule in the am and Take 2 capsules at bedtime.    . Rivaroxaban (XARELTO STARTER PACK) 15 & 20 MG TBPK Take as directed on package: Start with one  2m tablet by mouth twice a day with food. On Day 22, switch to one 220mtablet once a day with food. 51 each 0  . traMADol (ULTRAM) 50 MG tablet TAKE ONE TABLET BY MOUTH EVERY 8 HOURS AS NEEDED 20 tablet 0  . zolpidem (AMBIEN) 10 MG tablet Take 10 mg by mouth at bedtime as needed for sleep.      No current facility-administered medications on file prior to visit.   Allergies  Allergen Reactions  . Penicillins Rash    Has patient had a PCN reaction causing immediate rash, facial/tongue/throat swelling, SOB or lightheadedness with hypotension: Yes Has patient had a PCN reaction causing severe rash involving mucus membranes or skin necrosis: Yes  Has patient had a PCN reaction that  required hospitalization No Has patient had a PCN reaction occurring within the last 10 years: No If all of the above answers are "NO", then may proceed with Cephalosporin use.   . Methotrexate Other (See Comments)    Elevated pt.'s blood sugar  . Methotrexate Derivatives Other (See Comments)    Lowers blood pressure.   . Other     NO Blood products. [name]- causes low blood sugar (pt states she was taking medication but cant remember what it was for.)  . Zocor [Simvastatin] Other (See Comments)    Leg cramps Leg cramps    Recent Results (from the past 2160 hour(s))  CBC     Status: Abnormal   Collection Time: 01/26/15  1:19 PM  Result Value Ref Range   WBC 8.0 4.0 - 10.5 K/uL   RBC 3.66 (L) 3.87 - 5.11 MIL/uL   Hemoglobin 10.2 (L) 12.0 - 15.0 g/dL   HCT 30.4 (L) 36.0 - 46.0 %   MCV 83.1 78.0 - 100.0 fL   MCH 27.9 26.0 - 34.0 pg   MCHC 33.6 30.0 - 36.0 g/dL   RDW 15.6 (H) 11.5 - 15.5 %   Platelets 323 150 - 400 K/uL  Comprehensive metabolic panel     Status: Abnormal   Collection Time: 01/26/15  1:19 PM  Result Value Ref Range   Sodium 141 135 - 145 mmol/L   Potassium HEMOLYSIS AT THIS LEVEL MAY AFFECT RESULT 3.5 - 5.1 mmol/L   Chloride 105 101 - 111 mmol/L   CO2 25 22 - 32 mmol/L   Glucose, Bld 114 (H) 65 - 99 mg/dL   BUN 17 6 - 20 mg/dL   Creatinine, Ser 1.13 (H) 0.44 - 1.00 mg/dL   Calcium 9.9 8.9 - 10.3 mg/dL   Total Protein 7.0 6.5 - 8.1 g/dL   Albumin 3.7 3.5 - 5.0 g/dL   AST 31 15 - 41 U/L   ALT 18 14 - 54 U/L   Alkaline Phosphatase 89 38 - 126 U/L   Total Bilirubin 1.0 0.3 - 1.2 mg/dL   GFR calc non Af Amer 46 (L) >60 mL/min   GFR calc Af Amer 53 (L) >60 mL/min    Comment: (NOTE) The eGFR has been calculated using the CKD EPI equation. This calculation has not been validated in all clinical situations. eGFR's persistently <60 mL/min signify possible Chronic Kidney Disease.    Anion gap 11 5 - 15  CYCLIC CITRUL PEPTIDE ANTIBODY, IGG/IGA     Status:  None   Collection Time: 01/26/15  1:19 PM  Result Value Ref Range   CCP Antibodies IgG/IgA 9 0 - 19 units    Comment: (NOTE)  Negative               <20                          Weak positive      20 - 39                          Moderate positive  40 - 59                          Strong positive        >59 Performed At: [facility] [place], [place] [id number] Lindon Romp MD [id number]   Differential     Status: None   Collection Time: 01/26/15  1:19 PM  Result Value Ref Range   Neutrophils Relative % 47 %   Neutro Abs 3.7 1.7 - 7.7 K/uL   Lymphocytes Relative 45 %   Lymphs Abs 3.6 0.7 - 4.0 K/uL   Monocytes Relative 6 %   Monocytes Absolute 0.5 0.1 - 1.0 K/uL   Eosinophils Relative 2 %   Eosinophils Absolute 0.2 0.0 - 0.7 K/uL   Basophils Relative 0 %   Basophils Absolute 0.0 0.0 - 0.1 K/uL  Sedimentation rate     Status: Abnormal   Collection Time: 01/26/15  1:19 PM  Result Value Ref Range   Sed Rate 84 (H) 0 - 22 mm/hr  Uric acid     Status: Abnormal   Collection Time: 01/26/15  1:19 PM  Result Value Ref Range   Uric Acid, Serum 6.7 (H) 2.3 - 6.6 mg/dL  Rheumatoid factor     Status: None   Collection Time: 01/26/15  1:19 PM  Result Value Ref Range   Rhuematoid fact SerPl-aCnc <10.0 0.0 - 13.9 IU/mL    Comment: (NOTE) Performed At: [facility] [place], [place] [id number] Lindon Romp MD [id number]     Objective: General: Patient is awake, alert, and oriented x 3 and in no acute distress.  Integument: Skin is warm, dry and supple bilateral. Nails are tender, long, thickened and  dystrophic with subungual debris, consistent with onychomycosis, 1-5 bilateral with mild incurvation at lateral left 2nd toenail and lateral right 4th toenail. No signs of infection. No open lesions or preulcerative lesions present bilateral. Remaining integument unremarkable.  Vasculature:  Dorsalis  Pedis pulse 1/4 bilateral. Posterior Tibial pulse  1/4 bilateral.  Capillary fill time <3 sec 1-5 bilateral. Scant hair growth to the level of the digits. Temperature gradient within normal limits. No varicosities present bilateral. No edema present bilateral.   Neurology: The patient has diminished sensation measured with a 5.07/10g Semmes Weinstein Monofilament at all pedal sites bilateral . Vibratory sensation diminished bilateral with tuning fork. No Babinski sign present bilateral.   Musculoskeletal: Bunion and hammertoe pedal deformities noted bilateral. Pes planus foot type. Muscular strength 5/5 in all lower extremity muscular groups bilateral without pain on range of motion . No tenderness with calf compression bilateral.  Assessment and Plan: Problem List Items Addressed This Visit    None    Visit Diagnoses    Dermatophytosis of nail    -  Primary    Pain of toe, unspecified laterality        Bunion, unspecified laterality        Hammer toe, unspecified laterality  Pes planus of both feet        Type 2 diabetes mellitus with diabetic neuropathy, with long-term current use of insulin ([place])           -Examined patient. -Discussed and educated patient on diabetic foot care, especially with  regards to the vascular, neurological and musculoskeletal systems.  -Stressed the importance of good glycemic control and the detriment of not  controlling glucose levels in relation to the foot. -Mechanically debrided all nails 1-5 bilateral using sterile nail nipper and filed with dremel without incident  -Cont with good supportive diabetic shoes  -Answered all patient questions -Patient to return as needed or in 3 months for at risk foot care -Patient advised to call the office if any problems or questions arise in the  Meantime.  Landis Martins, DPM

## 2015-03-13 NOTE — Patient Instructions (Signed)
[employer] healthy feet daily after shower

## 2015-03-20 DIAGNOSIS — M25561 Pain in right knee: Secondary | ICD-10-CM | POA: Diagnosis not present

## 2015-03-20 DIAGNOSIS — M1711 Unilateral primary osteoarthritis, right knee: Secondary | ICD-10-CM | POA: Diagnosis not present

## 2015-03-20 DIAGNOSIS — R2689 Other abnormalities of gait and mobility: Secondary | ICD-10-CM | POA: Diagnosis not present

## 2015-04-12 ENCOUNTER — Ambulatory Visit: Payer: Medicare Other | Admitting: Cardiology

## 2015-04-13 DIAGNOSIS — Z794 Long term (current) use of insulin: Secondary | ICD-10-CM | POA: Diagnosis not present

## 2015-04-13 DIAGNOSIS — E039 Hypothyroidism, unspecified: Secondary | ICD-10-CM | POA: Diagnosis not present

## 2015-04-13 DIAGNOSIS — E1142 Type 2 diabetes mellitus with diabetic polyneuropathy: Secondary | ICD-10-CM | POA: Diagnosis not present

## 2015-04-18 DIAGNOSIS — E1142 Type 2 diabetes mellitus with diabetic polyneuropathy: Secondary | ICD-10-CM | POA: Diagnosis not present

## 2015-04-18 DIAGNOSIS — M25519 Pain in unspecified shoulder: Secondary | ICD-10-CM | POA: Diagnosis not present

## 2015-04-18 DIAGNOSIS — L309 Dermatitis, unspecified: Secondary | ICD-10-CM | POA: Diagnosis not present

## 2015-04-18 DIAGNOSIS — Z794 Long term (current) use of insulin: Secondary | ICD-10-CM | POA: Diagnosis not present

## 2015-04-23 ENCOUNTER — Ambulatory Visit: Payer: Medicare Other | Admitting: Sports Medicine

## 2015-05-01 ENCOUNTER — Ambulatory Visit (INDEPENDENT_AMBULATORY_CARE_PROVIDER_SITE_OTHER): Payer: Medicare Other | Admitting: Sports Medicine

## 2015-05-01 ENCOUNTER — Encounter: Payer: Self-pay | Admitting: Sports Medicine

## 2015-05-01 DIAGNOSIS — L84 Corns and callosities: Secondary | ICD-10-CM | POA: Diagnosis not present

## 2015-05-01 DIAGNOSIS — Z794 Long term (current) use of insulin: Secondary | ICD-10-CM

## 2015-05-01 DIAGNOSIS — M2010 Hallux valgus (acquired), unspecified foot: Secondary | ICD-10-CM | POA: Diagnosis not present

## 2015-05-01 DIAGNOSIS — E1140 Type 2 diabetes mellitus with diabetic neuropathy, unspecified: Secondary | ICD-10-CM | POA: Diagnosis not present

## 2015-05-01 DIAGNOSIS — M79676 Pain in unspecified toe(s): Secondary | ICD-10-CM

## 2015-05-01 DIAGNOSIS — B351 Tinea unguium: Secondary | ICD-10-CM

## 2015-05-01 DIAGNOSIS — Q828 Other specified congenital malformations of skin: Secondary | ICD-10-CM

## 2015-05-01 DIAGNOSIS — M2040 Other hammer toe(s) (acquired), unspecified foot: Secondary | ICD-10-CM

## 2015-05-01 NOTE — Patient Instructions (Signed)

## 2015-05-01 NOTE — Progress Notes (Signed)
Patient ID: [name], female   DOB: [birth year], 75 y.o.   MRN: [id number]  Subjective: [name] is a 75 y.o. female patient with history of type 2 diabetes who presents to office today complaining of long, painful nails and callus  while ambulating in shoes; unable to trim; states that she feels like she has a nail that is starting to ingrow at left 2nd toe and right 4th toe. Patient states that the glucose reading this morning was not recorded but good. Patient denies any new changes in medication or new problems. Patient denies any new cramping, numbness, burning or tingling in the legs.  Patient is also here to pick up diabetic shoes.  Patient Active Problem List   Diagnosis Date Noted  . Hypoglycemia 08/22/2014  . Neuropathy ([place]) 08/22/2014  . Hypothyroidism 08/22/2014  . Chronic pain 08/22/2014  . 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 Chest pain 08/05/2013  . Aortic stenosis 12/22/2012  . Hyperlipidemia 12/22/2012  . PVC (premature ventricular contraction) 12/22/2012  . HTN (hypertension) 12/22/2012   Current Outpatient Prescriptions on File Prior to Visit  Medication Sig Dispense Refill  . Abatacept (ORENCIA IV) Inject into the vein every 30 (thirty) days.     Marland Kitchen allopurinol (ZYLOPRIM) 300 MG tablet Take 300 mg by mouth daily.    Marland Kitchen atorvastatin (LIPITOR) 40 MG tablet Take 40 mg by mouth daily.    . cholecalciferol (VITAMIN D) 1000 UNITS tablet Take 1,000 Units by mouth daily.     . clindamycin (CLEOCIN) 300 MG capsule Take 1 capsule (300 mg total) by mouth 3 (three) times daily. 30 capsule 2  . cyclobenzaprine (FLEXERIL) 10 MG tablet Take 10 mg by mouth 3 (three) times daily as needed for muscle spasms.     . diclofenac sodium (VOLTAREN) 1 % GEL Apply 2 g topically 4 (four) times daily as needed (pain). Rub into affected area of foot 2 to 4 times daily    . ferrous fumarate (HEMOCYTE - 106 MG FE) 325  (106 FE) MG TABS Take 1 tablet by mouth daily.     . folic acid (FOLVITE) 1 MG tablet Take 1 mg by mouth daily.    Marland Kitchen gabapentin (NEURONTIN) 100 MG capsule Take 100 mg by mouth 3 (three) times daily as needed (pain).     . hydrALAZINE (APRESOLINE) 10 MG tablet Take 1 tablet (10 mg total) by mouth every 8 (eight) hours. 90 tablet 1  . HYDROcodone-acetaminophen (NORCO/VICODIN) 5-325 MG per tablet Take 1-2 tablets by mouth every 3 (three) hours as needed for moderate pain. 30 tablet 0  . levothyroxine (SYNTHROID, LEVOTHROID) 200 MCG tablet Take 200 mcg by mouth daily before breakfast.    . losartan (COZAAR) 50 MG tablet Take 50 mg by mouth daily.     . multivitamin (THERAGRAN) per tablet Take 1 tablet by mouth at bedtime.     Marland Kitchen NOVOLIN 70/30 RELION (70-30) 100 UNIT/ML injection Inject 2 Units into the skin 2 (two) times daily. For high blood sugar (Patient taking differently: Inject 8 Units into the skin daily with supper. For high blood sugar) 10 mL 11  . predniSONE (STERAPRED UNI-PAK 21 TAB) 10 MG (21) TBPK tablet Take 10 mg by mouth daily.    . pregabalin (LYRICA) 75 MG capsule Take 75-150 mg by mouth 2 (two) times daily. Take 1 capsule in the am and Take 2 capsules at bedtime.    . Rivaroxaban (XARELTO STARTER PACK)  15 & 20 MG TBPK Take as directed on package: Start with one 15mg  tablet by mouth twice a day with food. On Day 22, switch to one 20mg  tablet once a day with food. 51 each 0  . traMADol (ULTRAM) 50 MG tablet TAKE ONE TABLET BY MOUTH EVERY 8 HOURS AS NEEDED 20 tablet 0  . zolpidem (AMBIEN) 10 MG tablet Take 10 mg by mouth at bedtime as needed for sleep.      No current facility-administered medications on file prior to visit.   Allergies  Allergen Reactions  . Penicillins Rash    Has patient had a PCN reaction causing immediate rash, facial/tongue/throat swelling, SOB or lightheadedness with hypotension: Yes Has patient had a PCN reaction causing severe rash involving mucus membranes  or skin necrosis: Yes  Has patient had a PCN reaction that required hospitalization No Has patient had a PCN reaction occurring within the last 10 years: No If all of the above answers are "NO", then may proceed with Cephalosporin use.   . Methotrexate Other (See Comments)    Elevated pt.'s blood sugar  . Methotrexate Derivatives Other (See Comments)    Lowers blood pressure.   . Other     NO Blood products. [name]- causes low blood sugar (pt states she was taking medication but cant remember what it was for.)  . Zocor [Simvastatin] Other (See Comments)    Leg cramps Leg cramps    No results found for this or any previous visit (from the past 2160 hour(s)).  Objective: General: Patient is awake, alert, and oriented x 3 and in no acute distress.  Integument: Skin is warm, dry and supple bilateral. Nails are tender, long, thickened and  dystrophic with subungual debris, consistent with onychomycosis, 1-5 bilateral with mild incurvation at lateral left 2nd toenail and lateral right 4th toenail. No signs of infection. No open lesions. + callus/ preulcerative lesions present left plantar forefoot and at right 4th toe distal tuft, with no signs of infection. Remaining integument unremarkable.  Vasculature:  Dorsalis Pedis pulse 1/4 bilateral. Posterior Tibial pulse  1/4 bilateral.  Capillary fill time <3 sec 1-5 bilateral. Scant hair growth to the level of the digits. Temperature gradient within normal limits. No varicosities present bilateral. No edema present bilateral.   Neurology: The patient has diminished sensation measured with a 5.07/10g Semmes Weinstein Monofilament at all pedal sites bilateral . Vibratory sensation diminished bilateral with tuning fork. No Babinski sign present bilateral.   Musculoskeletal: Bunion and hammertoe pedal deformities noted bilateral. Pes planus foot type. Muscular strength 5/5 in all lower extremity muscular groups bilateral without pain on range  of motion . No tenderness with calf compression bilateral.  Assessment and Plan: Problem List Items Addressed This Visit    None    Visit Diagnoses    Type 2 diabetes mellitus with diabetic neuropathy, with long-term current use of insulin ([facility])    -  Primary    Bunion, unspecified laterality        Hammer toe, unspecified laterality        Corns and callosities        Dermatophytosis of nail        Pain of toe, unspecified laterality           -Examined patient. -Discussed and educated patient on diabetic foot care, especially with  regards to the vascular, neurological and musculoskeletal systems.  -Stressed the importance of good glycemic control and the detriment of not  controlling glucose levels  in relation to the foot. -Mechanically debrided all nails 1-5 bilateral using sterile nail nipper and filed with dremel without incident  -Debrided callus at right 4th toe and left plantar forefoot using sterile chisel blade without incident -Patient presents for diabetic shoe pick up, shoes are tried on for good fit by Medical assistant.  Patient received 1 Pair Apex [id number] Robyn tan in women's size 8.5 wide and 3 pairs custom molded diabetic inserts.  Verbal and written break in and wear instructions given.  -Answered all patient questions -Patient to return as needed or in 3 months for at risk foot care; May consider PNA at right 4th toenail at next visit if continues to be symptomatic -Patient advised to call the office if any problems or questions arise in the  Meantime.  Landis Martins, DPM

## 2015-05-02 DIAGNOSIS — M25519 Pain in unspecified shoulder: Secondary | ICD-10-CM | POA: Diagnosis not present

## 2015-05-02 DIAGNOSIS — I82409 Acute embolism and thrombosis of unspecified deep veins of unspecified lower extremity: Secondary | ICD-10-CM | POA: Diagnosis not present

## 2015-05-02 DIAGNOSIS — L309 Dermatitis, unspecified: Secondary | ICD-10-CM | POA: Diagnosis not present

## 2015-05-07 ENCOUNTER — Encounter: Payer: Self-pay | Admitting: Cardiology

## 2015-05-07 ENCOUNTER — Ambulatory Visit (INDEPENDENT_AMBULATORY_CARE_PROVIDER_SITE_OTHER): Payer: Medicare Other | Admitting: Cardiology

## 2015-05-07 VITALS — BP 140/70 | HR 66 | Ht 64.0 in | Wt 197.1 lb

## 2015-05-07 DIAGNOSIS — I10 Essential (primary) hypertension: Secondary | ICD-10-CM | POA: Diagnosis not present

## 2015-05-07 DIAGNOSIS — I350 Nonrheumatic aortic (valve) stenosis: Secondary | ICD-10-CM | POA: Diagnosis not present

## 2015-05-07 DIAGNOSIS — I493 Ventricular premature depolarization: Secondary | ICD-10-CM

## 2015-05-07 DIAGNOSIS — E7800 Pure hypercholesterolemia, unspecified: Secondary | ICD-10-CM | POA: Diagnosis not present

## 2015-05-07 MED ORDER — METOPROLOL TARTRATE 25 MG PO TABS: 25.0000 mg | ORAL_TABLET | Freq: Two times a day (BID) | ORAL | Status: DC | PRN

## 2015-05-07 NOTE — Progress Notes (Signed)
[facility]. [address], [address], [place]  [address] Phone: [phone] Fax:  [phone]  Date:  05/07/2015   ID:  [name], DOB 13-Sep-1940, MRN [id number]  PCP:  Lilian Coma, MD   History of Present Illness: [name] is a 75 y.o. female with history of carotid artery disease, with bilateral ICA stenosis of up to 40%, mild aortic insufficiency/stenosis, diastolic dysfunction and type 2 diabetes. Had heart catheterization on 08/08/13 which was normal. EF 70%, mild aortic stenosis on echocardiogram EKG - nonspecific ST-T wave changes. Patient has fairly good functional status. Previously she had an episode of syncope where she was standing in the kitchen/working felt dizzy fell then returned to consciousness as she hit the floor. currently she describes no significant warning. She woke up and was in her usual state. She did need some help getting up off of the floor. No diaphoresis. She is a Restaurant manager, fast food. Hemoglobin 12.0, creatinine 1.36.   05/07/15-feels like heart racing and stops. PVCs previously diagnosed on event monitor. No syncope. No chest pain. DVT 2015 now off Xarelto. Right arm DVT.   Cardiac catheterization 08/08/13-normal coronary arteries  Event monitor 2/14 showed no adverse arrhythmias.PVC's  Nuclear stress test: 03/05/12-low risk, no ischemia, normal EF  Echocardiogram: 08/08/13 - EF 70%, mild aortic stenosis-    Wt Readings from Last 3 Encounters:  05/07/15 197 lb 1.9 oz (89.413 kg)  01/26/15 198 lb (89.812 kg)  12/25/14 208 lb (94.348 kg)     Past Medical History  Diagnosis Date  . Rheumatoid arthritis(714.0)   . Glaucoma   . HTN (hypertension)   . Hypothyroidism   . Diabetes mellitus   . Hypercholesteremia   . Chronic pain   . Allergic rhinitis   . Peripheral neuropathy ([place])   . Osteoarthritis   . Diverticulosis   . Hemorrhoids     internal  . PPD positive     6 months ago  . Anemia   . Sickle cell trait ([place])   .  Meningioma ([place]) 2008  . CKD (chronic kidney disease), stage III   . Aortic stenosis 12/22/2012    Mild 3/14-2.35 m/s peak velocity  . Hyperlipidemia 12/22/2012  . PVC (premature ventricular contraction) 12/22/2012  . DVT (deep venous thrombosis) [facility])     Past Surgical History  Procedure Laterality Date  . Joint replacement  2001    left TKR  . Fistula repair  03/2010    partial colectomy  . Intracranial surgery  11/2007    removal of meningioma  . Total thyroidectomy  1992  . Rotator cuff repair  1999    BIlateral  . Sigmoidectomy  2012  . Abdominal hysterectomy  1978    TAH,& BSO 1  . Tonsillectomy and adenoidectomy    . Wrist sx      right   . Left heart catheterization with coronary angiogram N/A 08/08/2013    Procedure: LEFT HEART CATHETERIZATION WITH CORONARY ANGIOGRAM;  Surgeon: Peter M Martinique, MD;  Location: [facility] CATH LAB;  Service: Cardiovascular;  Laterality: N/A;    Current Outpatient Prescriptions  Medication Sig Dispense Refill  . allopurinol (ZYLOPRIM) 300 MG tablet Take 300 mg by mouth daily.    Marland Kitchen atorvastatin (LIPITOR) 40 MG tablet Take 40 mg by mouth daily.    . cholecalciferol (VITAMIN D) 1000 UNITS tablet Take 1,000 Units by mouth daily.     . clindamycin (CLEOCIN) 300 MG capsule Take 1 capsule (300 mg  total) by mouth 3 (three) times daily. 30 capsule 2  . cyclobenzaprine (FLEXERIL) 10 MG tablet Take 10 mg by mouth 3 (three) times daily as needed for muscle spasms.     . diclofenac sodium (VOLTAREN) 1 % GEL Apply 2 g topically 4 (four) times daily as needed (pain). Rub into affected area of foot 2 to 4 times daily    . ferrous fumarate (HEMOCYTE - 106 MG FE) 325 (106 FE) MG TABS Take 1 tablet by mouth daily.     . folic acid (FOLVITE) 1 MG tablet Take 1 mg by mouth daily.    Marland Kitchen HYDROcodone-acetaminophen (NORCO/VICODIN) 5-325 MG per tablet Take 1-2 tablets by mouth every 3 (three) hours as needed for moderate pain. 30 tablet 0  . insulin NPH-regular Human  (NOVOLIN 70/30) (70-30) 100 UNIT/ML injection Inject 8 Units into the skin daily with supper.    . levothyroxine (SYNTHROID, LEVOTHROID) 200 MCG tablet Take 200 mcg by mouth daily before breakfast.    . losartan (COZAAR) 50 MG tablet Take 50 mg by mouth daily.     . multivitamin (THERAGRAN) per tablet Take 1 tablet by mouth at bedtime.     . predniSONE (STERAPRED UNI-PAK 21 TAB) 10 MG (21) TBPK tablet Take 10 mg by mouth daily.    . pregabalin (LYRICA) 75 MG capsule Take 75-150 mg by mouth 2 (two) times daily. Take 1 capsule in the am and Take 2 capsules at bedtime.    . Rivaroxaban (XARELTO STARTER PACK) 15 & 20 MG TBPK Take as directed on package: Start with one 15mg  tablet by mouth twice a day with food. On Day 22, switch to one 20mg  tablet once a day with food. 51 each 0  . metoprolol tartrate (LOPRESSOR) 25 MG tablet Take 1 tablet (25 mg total) by mouth 2 (two) times daily as needed. 60 tablet 6   No current facility-administered medications for this visit.   She is not currently on Xarelto.  Allergies:    Allergies  Allergen Reactions  . Penicillins Rash    Has patient had a PCN reaction causing immediate rash, facial/tongue/throat swelling, SOB or lightheadedness with hypotension: Yes Has patient had a PCN reaction causing severe rash involving mucus membranes or skin necrosis: Yes  Has patient had a PCN reaction that required hospitalization No Has patient had a PCN reaction occurring within the last 10 years: No If all of the above answers are "NO", then may proceed with Cephalosporin use.   . Methotrexate Other (See Comments)    Elevated pt.'s blood sugar  . Methotrexate Derivatives Other (See Comments)    Lowers blood pressure.   . Other     NO Blood products. [name]- causes low blood sugar (pt states she was taking medication but cant remember what it was for.)  . Zocor [Simvastatin] Other (See Comments)    Leg cramps Leg cramps    Social History:  The patient   reports that she quit smoking about 53 years ago. She has never used smokeless tobacco. She reports that she drinks alcohol. She reports that she does not use illicit drugs.   ROS:  Please see the history of present illness.  Rash on hands.  Denies any bleeding, syncope, orthopnea, PND, no shortness of breath.    PHYSICAL EXAM: VS:  BP 140/70 mmHg  Pulse 66  Ht 5\' 4"  (1.626 m)  Wt 197 lb 1.9 oz (89.413 kg)  BMI 33.82 kg/m2 Well nourished, well developed, in no acute  distress HEENT: normal Neck: no JVD Cardiac:  normal S1, S2; RRR; 2/6 systolic murmur Lungs:  clear to auscultation bilaterally, no wheezing, rhonchi or rales Abd: soft, nontender, no hepatomegaly Ext: no edemaCatheterization site appears normal Skin: warm and dryRash on palms noted Neuro: no focal abnormalities noted, speech difficulty  EKG:  None today.      ASSESSMENT AND PLAN:  1. Mild aortic stenosis-continue to monitor clinically. Exercise. Monitor blood pressure. We will consider echocardiogram the next year. 2. Hyperlipidemia-continue with atorvastatin. This is being monitored by Dr. Stephanie Acre. 3. Hypertension-currently well controlled on angiotensin receptor blocker. 4. PVCs-seen on event monitor. Benign. I will give her metoprolol 25 mg twice a day when necessary. 5. Syncope-no further occurrences. Cardiac workup reassuring. 6. History of chest pain-cardiac catheterization reassuring.    One year follow up.  Signed, Candee Furbish, MD [facility]  05/07/2015 3:10 PM

## 2015-05-07 NOTE — Patient Instructions (Signed)
Medication Instructions:  Please start Metoprolol tartrate 25 mg up to twice a day as needed for palpitations. Continue all other medications as listed.  Follow-Up: Follow up in 1 year with Dr. Marlou Porch.  You will receive a letter in the mail 2 months before you are due.  Please call us when you receive this letter to schedule your follow up appointment.  If you need a refill on your cardiac medications before your next appointment, please call your pharmacy.  Thank you for choosing [facility]!!

## 2015-05-08 DIAGNOSIS — M1A00X0 Idiopathic chronic gout, unspecified site, without tophus (tophi): Secondary | ICD-10-CM | POA: Diagnosis not present

## 2015-05-08 DIAGNOSIS — M174 Other bilateral secondary osteoarthritis of knee: Secondary | ICD-10-CM | POA: Diagnosis not present

## 2015-05-08 DIAGNOSIS — M0579 Rheumatoid arthritis with rheumatoid factor of multiple sites without organ or systems involvement: Secondary | ICD-10-CM | POA: Diagnosis not present

## 2015-05-08 DIAGNOSIS — Z79899 Other long term (current) drug therapy: Secondary | ICD-10-CM | POA: Diagnosis not present

## 2015-05-08 DIAGNOSIS — M25512 Pain in left shoulder: Secondary | ICD-10-CM | POA: Diagnosis not present

## 2015-05-15 DIAGNOSIS — H40023 Open angle with borderline findings, high risk, bilateral: Secondary | ICD-10-CM | POA: Diagnosis not present

## 2015-05-22 DIAGNOSIS — N644 Mastodynia: Secondary | ICD-10-CM | POA: Diagnosis not present

## 2015-05-22 DIAGNOSIS — N6489 Other specified disorders of breast: Secondary | ICD-10-CM | POA: Diagnosis not present

## 2015-05-28 DIAGNOSIS — H2513 Age-related nuclear cataract, bilateral: Secondary | ICD-10-CM | POA: Diagnosis not present

## 2015-05-28 DIAGNOSIS — H40023 Open angle with borderline findings, high risk, bilateral: Secondary | ICD-10-CM | POA: Diagnosis not present

## 2015-05-30 ENCOUNTER — Emergency Department (HOSPITAL_COMMUNITY): Payer: Medicare Other

## 2015-05-30 ENCOUNTER — Encounter (HOSPITAL_COMMUNITY): Payer: Self-pay

## 2015-05-30 ENCOUNTER — Emergency Department (HOSPITAL_COMMUNITY)
Admission: EM | Admit: 2015-05-30 | Discharge: 2015-05-30 | Disposition: A | Discharge status: Home / Self Care | Payer: Medicare Other | Attending: Emergency Medicine | Admitting: Emergency Medicine

## 2015-05-30 DIAGNOSIS — D573 Sickle-cell trait: Secondary | ICD-10-CM | POA: Diagnosis not present

## 2015-05-30 DIAGNOSIS — M1990 Unspecified osteoarthritis, unspecified site: Secondary | ICD-10-CM | POA: Diagnosis not present

## 2015-05-30 DIAGNOSIS — Z8719 Personal history of other diseases of the digestive system: Secondary | ICD-10-CM | POA: Diagnosis not present

## 2015-05-30 DIAGNOSIS — Z86018 Personal history of other benign neoplasm: Secondary | ICD-10-CM | POA: Insufficient documentation

## 2015-05-30 DIAGNOSIS — N183 Chronic kidney disease, stage 3 (moderate): Secondary | ICD-10-CM | POA: Diagnosis not present

## 2015-05-30 DIAGNOSIS — E7800 Pure hypercholesterolemia, unspecified: Secondary | ICD-10-CM | POA: Insufficient documentation

## 2015-05-30 DIAGNOSIS — E785 Hyperlipidemia, unspecified: Secondary | ICD-10-CM | POA: Diagnosis not present

## 2015-05-30 DIAGNOSIS — E119 Type 2 diabetes mellitus without complications: Secondary | ICD-10-CM | POA: Diagnosis not present

## 2015-05-30 DIAGNOSIS — D649 Anemia, unspecified: Secondary | ICD-10-CM | POA: Diagnosis not present

## 2015-05-30 DIAGNOSIS — I10 Essential (primary) hypertension: Secondary | ICD-10-CM | POA: Diagnosis not present

## 2015-05-30 DIAGNOSIS — I129 Hypertensive chronic kidney disease with stage 1 through stage 4 chronic kidney disease, or unspecified chronic kidney disease: Secondary | ICD-10-CM | POA: Insufficient documentation

## 2015-05-30 DIAGNOSIS — G629 Polyneuropathy, unspecified: Secondary | ICD-10-CM | POA: Diagnosis not present

## 2015-05-30 DIAGNOSIS — G8929 Other chronic pain: Secondary | ICD-10-CM | POA: Insufficient documentation

## 2015-05-30 DIAGNOSIS — Z794 Long term (current) use of insulin: Secondary | ICD-10-CM | POA: Diagnosis not present

## 2015-05-30 DIAGNOSIS — E039 Hypothyroidism, unspecified: Secondary | ICD-10-CM | POA: Diagnosis not present

## 2015-05-30 DIAGNOSIS — Z86718 Personal history of other venous thrombosis and embolism: Secondary | ICD-10-CM | POA: Diagnosis not present

## 2015-05-30 DIAGNOSIS — M25512 Pain in left shoulder: Secondary | ICD-10-CM

## 2015-05-30 DIAGNOSIS — Z880 Allergy status to penicillin: Secondary | ICD-10-CM | POA: Insufficient documentation

## 2015-05-30 DIAGNOSIS — M069 Rheumatoid arthritis, unspecified: Secondary | ICD-10-CM | POA: Diagnosis not present

## 2015-05-30 DIAGNOSIS — Z792 Long term (current) use of antibiotics: Secondary | ICD-10-CM | POA: Diagnosis not present

## 2015-05-30 DIAGNOSIS — Z79899 Other long term (current) drug therapy: Secondary | ICD-10-CM | POA: Diagnosis not present

## 2015-05-30 DIAGNOSIS — Z87891 Personal history of nicotine dependence: Secondary | ICD-10-CM | POA: Insufficient documentation

## 2015-05-30 DIAGNOSIS — R079 Chest pain, unspecified: Secondary | ICD-10-CM | POA: Diagnosis not present

## 2015-05-30 LAB — CBC
HEMATOCRIT: 33.1 % — AB (ref 36.0–46.0)
HEMOGLOBIN: 10.5 g/dL — AB (ref 12.0–15.0)
MCH: 26.8 pg (ref 26.0–34.0)
MCHC: 31.7 g/dL (ref 30.0–36.0)
MCV: 84.4 fL (ref 78.0–100.0)
Platelets: 306 10*3/uL (ref 150–400)
RBC: 3.92 MIL/uL (ref 3.87–5.11)
RDW: 15.5 % (ref 11.5–15.5)
WBC: 8.6 10*3/uL (ref 4.0–10.5)

## 2015-05-30 LAB — BASIC METABOLIC PANEL
ANION GAP: 10 (ref 5–15)
BUN: 17 mg/dL (ref 6–20)
CHLORIDE: 109 mmol/L (ref 101–111)
CO2: 21 mmol/L — ABNORMAL LOW (ref 22–32)
Calcium: 9.7 mg/dL (ref 8.9–10.3)
Creatinine, Ser: 1.22 mg/dL — ABNORMAL HIGH (ref 0.44–1.00)
GFR calc Af Amer: 49 mL/min — ABNORMAL LOW (ref >60)
GFR, EST NON AFRICAN AMERICAN: 42 mL/min — AB (ref >60)
GLUCOSE: 164 mg/dL — AB (ref 65–99)
POTASSIUM: 4 mmol/L (ref 3.5–5.1)
Sodium: 140 mmol/L (ref 135–145)

## 2015-05-30 LAB — I-STAT TROPONIN, ED: Troponin i, poc: 0 ng/mL (ref 0.00–0.08)

## 2015-05-30 MED ORDER — HYDROCODONE-ACETAMINOPHEN 5-325 MG PO TABS
1.0000 | ORAL_TABLET | Freq: Once | ORAL | Status: AC
  Administered 2015-05-30: 1 via ORAL
  Filled 2015-05-30: qty 1

## 2015-05-30 NOTE — ED Notes (Signed)
Patient here with left arm/shoulder pain x 1 week. Reports pain much worse with movement and pain with raising her arm. No other associated symptoms
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
## 2015-05-30 NOTE — ED Notes (Signed)
MD at bedside. 

## 2015-05-30 NOTE — ED Notes (Signed)
Patient left at this time with all belongings. 

## 2015-06-01 DIAGNOSIS — M25511 Pain in right shoulder: Secondary | ICD-10-CM | POA: Diagnosis not present

## 2015-06-01 DIAGNOSIS — M542 Cervicalgia: Secondary | ICD-10-CM | POA: Diagnosis not present

## 2015-06-06 DIAGNOSIS — E039 Hypothyroidism, unspecified: Secondary | ICD-10-CM | POA: Diagnosis not present

## 2015-06-12 ENCOUNTER — Ambulatory Visit (INDEPENDENT_AMBULATORY_CARE_PROVIDER_SITE_OTHER): Payer: Medicare Other | Admitting: Sports Medicine

## 2015-06-12 ENCOUNTER — Encounter: Payer: Self-pay | Admitting: Sports Medicine

## 2015-06-12 DIAGNOSIS — L84 Corns and callosities: Secondary | ICD-10-CM

## 2015-06-12 DIAGNOSIS — M2040 Other hammer toe(s) (acquired), unspecified foot: Secondary | ICD-10-CM

## 2015-06-12 DIAGNOSIS — B351 Tinea unguium: Secondary | ICD-10-CM

## 2015-06-12 DIAGNOSIS — M2010 Hallux valgus (acquired), unspecified foot: Secondary | ICD-10-CM

## 2015-06-12 DIAGNOSIS — M79676 Pain in unspecified toe(s): Secondary | ICD-10-CM

## 2015-06-12 DIAGNOSIS — E1140 Type 2 diabetes mellitus with diabetic neuropathy, unspecified: Secondary | ICD-10-CM

## 2015-06-12 DIAGNOSIS — Z794 Long term (current) use of insulin: Secondary | ICD-10-CM

## 2015-06-12 NOTE — Progress Notes (Signed)
Patient ID: [name], female   DOB: 1940-02-12, 75 y.o.   MRN: [id number]  Subjective: [name] is a 75 y.o. female patient with history of type 2 diabetes who returns to office today complaining of long, painful nails and callus  while ambulating in shoes; unable to trim. Patient states that the glucose reading this morning was not recorded but good/ last week 107. Patient denies any new changes in medication or new problems. Patient denies any new cramping, numbness, burning or tingling in the legs.  Patient Active Problem List   Diagnosis Date Noted  . Hypoglycemia 08/22/2014  . Neuropathy ([place]) 08/22/2014  . Hypothyroidism 08/22/2014  . Chronic pain 08/22/2014  . 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 Chest pain 08/05/2013  . Aortic stenosis 12/22/2012  . Hyperlipidemia 12/22/2012  . PVC (premature ventricular contraction) 12/22/2012  . HTN (hypertension) 12/22/2012  . Open angle with borderline findings, high risk 12/15/2011  . Cataract, nuclear 05/25/2011   Current Outpatient Prescriptions on File Prior to Visit  Medication Sig Dispense Refill  . allopurinol (ZYLOPRIM) 300 MG tablet Take 300 mg by mouth daily.    Marland Kitchen atorvastatin (LIPITOR) 40 MG tablet Take 40 mg by mouth daily.    . cholecalciferol (VITAMIN D) 1000 UNITS tablet Take 1,000 Units by mouth daily.     . cyclobenzaprine (FLEXERIL) 10 MG tablet Take 10 mg by mouth 3 (three) times daily as needed for muscle spasms.     . diclofenac sodium (VOLTAREN) 1 % GEL Apply 2 g topically 4 (four) times daily as needed (pain). Rub into affected area of foot 2 to 4 times daily    . ferrous fumarate (HEMOCYTE - 106 MG FE) 325 (106 FE) MG TABS Take 1 tablet by mouth daily.     . folic acid (FOLVITE) 1 MG tablet Take 1 mg by mouth daily.    Marland Kitchen HYDROcodone-acetaminophen (NORCO/VICODIN) 5-325 MG per tablet Take 1-2 tablets by mouth every 3 (three) hours as needed  for moderate pain. 30 tablet 0  . insulin NPH-regular Human (NOVOLIN 70/30) (70-30) 100 UNIT/ML injection Inject 4 Units into the skin daily as needed (high blood sugar).     Marland Kitchen levothyroxine (SYNTHROID, LEVOTHROID) 200 MCG tablet Take 175 mcg by mouth daily before breakfast.     . losartan (COZAAR) 50 MG tablet Take 50 mg by mouth daily.     . metoprolol tartrate (LOPRESSOR) 25 MG tablet Take 1 tablet (25 mg total) by mouth 2 (two) times daily as needed. (Patient taking differently: Take 25 mg by mouth 2 (two) times daily as needed (heart). ) 60 tablet 6  . multivitamin (THERAGRAN) per tablet Take 1 tablet by mouth at bedtime.     . predniSONE (STERAPRED UNI-PAK 21 TAB) 10 MG (21) TBPK tablet Take 10 mg by mouth daily.    . pregabalin (LYRICA) 75 MG capsule Take 75-150 mg by mouth 2 (two) times daily. Take 1 capsule in the am and Take 2 capsules at bedtime.     No current facility-administered medications on file prior to visit.   Allergies  Allergen Reactions  . Penicillins Rash    Has patient had a PCN reaction causing immediate rash, facial/tongue/throat swelling, SOB or lightheadedness with hypotension: Yes Has patient had a PCN reaction causing severe rash involving mucus membranes or skin necrosis: Yes  Has patient had a PCN reaction that required hospitalization No Has patient had a PCN reaction occurring within  the last 10 years: No If all of the above answers are "NO", then may proceed with Cephalosporin use.   . Methotrexate Other (See Comments)    Elevated pt.'s blood sugar  . Methotrexate Derivatives Other (See Comments)    Lowers blood pressure.   . Other     NO Blood products. [name]- causes low blood sugar (pt states she was taking medication but cant remember what it was for.)  . Zocor [Simvastatin] Other (See Comments)    Leg cramps Leg cramps    Recent Results (from the past 2160 hour(s))  Basic metabolic panel     Status: Abnormal   Collection Time: 05/30/15   6:53 PM  Result Value Ref Range   Sodium 140 135 - 145 mmol/L   Potassium 4.0 3.5 - 5.1 mmol/L   Chloride 109 101 - 111 mmol/L   CO2 21 (L) 22 - 32 mmol/L   Glucose, Bld 164 (H) 65 - 99 mg/dL   BUN 17 6 - 20 mg/dL   Creatinine, Ser 1.22 (H) 0.44 - 1.00 mg/dL   Calcium 9.7 8.9 - 10.3 mg/dL   GFR calc non Af Amer 42 (L) >60 mL/min   GFR calc Af Amer 49 (L) >60 mL/min    Comment: (NOTE) The eGFR has been calculated using the CKD EPI equation. This calculation has not been validated in all clinical situations. eGFR's persistently <60 mL/min signify possible Chronic Kidney Disease.    Anion gap 10 5 - 15  CBC     Status: Abnormal   Collection Time: 05/30/15  6:53 PM  Result Value Ref Range   WBC 8.6 4.0 - 10.5 K/uL   RBC 3.92 3.87 - 5.11 MIL/uL   Hemoglobin 10.5 (L) 12.0 - 15.0 g/dL   HCT 33.1 (L) 36.0 - 46.0 %   MCV 84.4 78.0 - 100.0 fL   MCH 26.8 26.0 - 34.0 pg   MCHC 31.7 30.0 - 36.0 g/dL   RDW 15.5 11.5 - 15.5 %   Platelets 306 150 - 400 K/uL  I-stat troponin, ED     Status: None   Collection Time: 05/30/15  7:00 PM  Result Value Ref Range   Troponin i, poc 0.00 0.00 - 0.08 ng/mL   Comment 3            Comment: Due to the release kinetics of cTnI, a negative result within the first hours of the onset of symptoms does not rule out myocardial infarction with certainty. If myocardial infarction is still suspected, repeat the test at appropriate intervals.     Objective: General: Patient is awake, alert, and oriented x 3 and in no acute distress.  Integument: Skin is warm, dry and supple bilateral. Nails are tender, long, thickened and  dystrophic with subungual debris, consistent with onychomycosis, 1-5 bilateral. No open lesions. + callus/ preulcerative lesions present left plantar forefoot and at right 4th toe distal tuft (mostly involved), with no signs of infection. Remaining integument unremarkable.  Vasculature:  Dorsalis Pedis pulse 1/4 bilateral. Posterior  Tibial pulse  1/4 bilateral.  Capillary fill time <3 sec 1-5 bilateral. Scant hair growth to the level of the digits. Temperature gradient within normal limits. No varicosities present bilateral. No edema present bilateral.   Neurology: The patient has diminished sensation measured with a 5.07/10g Semmes Weinstein Monofilament at all pedal sites bilateral . Vibratory sensation diminished bilateral with tuning fork. No Babinski sign present bilateral.   Musculoskeletal: Bunion and hammertoe pedal deformities noted bilateral. Pes  planus foot type. Muscular strength 5/5 in all lower extremity muscular groups bilateral without pain on range of motion . No tenderness with calf compression bilateral.  Assessment and Plan: Problem List Items Addressed This Visit    None    Visit Diagnoses    Dermatophytosis of nail    -  Primary    Corns and callosities        Hammer toe, unspecified laterality        Bunion, unspecified laterality        Type 2 diabetes mellitus with diabetic neuropathy, with long-term current use of insulin (HCC)        Pain of toe, unspecified laterality          -Examined patient. -Discussed and educated patient on diabetic foot care, especially with  regards to the vascular, neurological and musculoskeletal systems.  -Stressed the importance of good glycemic control and the detriment of not  controlling glucose levels in relation to the foot. -Mechanically debrided all nails 1-5 bilateral using sterile nail nipper and filed with dremel without incident  -Debrided callus at right 4th toe and left plantar forefoot using sterile chisel blade without incident -Dispensed toe crest pad for right 4th toe -Answered all patient questions -Patient to return as in 3 months for at risk foot care -Patient advised to call the office if any problems or questions arise in the meantime.  Landis Martins, DPM

## 2015-06-27 ENCOUNTER — Encounter: Payer: Self-pay | Admitting: Podiatry

## 2015-06-27 ENCOUNTER — Ambulatory Visit (INDEPENDENT_AMBULATORY_CARE_PROVIDER_SITE_OTHER): Payer: Medicare Other | Admitting: Podiatry

## 2015-06-27 DIAGNOSIS — M2040 Other hammer toe(s) (acquired), unspecified foot: Secondary | ICD-10-CM

## 2015-06-27 NOTE — Patient Instructions (Signed)
He toes and right and left feet are contracted are hammertoes causing discomfort especially if the answer the fourth toes on right and left feet Wear the toe crest on the right and left feet to elevate the hands of the toes Return as needed  Diabetes and Foot Care Diabetes may cause you to have problems because of poor blood supply (circulation) to your feet and legs. This may cause the skin on your feet to become thinner, break easier, and heal more slowly. Your skin may become dry, and the skin may peel and crack. You may also have nerve damage in your legs and feet causing decreased feeling in them. You may not notice minor injuries to your feet that could lead to infections or more serious problems. Taking care of your feet is one of the most important things you can do for yourself.  HOME CARE INSTRUCTIONS  Wear shoes at all times, even in the house. Do not go barefoot. Bare feet are easily injured.  Check your feet daily for blisters, cuts, and redness. If you cannot see the bottom of your feet, use a mirror or ask someone for help.  Wash your feet with warm water (do not use hot water) and mild soap. Then pat your feet and the areas between your toes until they are completely dry. Do not soak your feet as this can dry your skin.  Apply a moisturizing lotion or petroleum jelly (that does not contain alcohol and is unscented) to the skin on your feet and to dry, brittle toenails. Do not apply lotion between your toes.  Trim your toenails straight across. Do not dig under them or around the cuticle. File the edges of your nails with an emery board or nail file.  Do not cut corns or calluses or try to remove them with medicine.  Wear clean socks or stockings every day. Make sure they are not too tight. Do not wear knee-high stockings since they may decrease blood flow to your legs.  Wear shoes that fit properly and have enough cushioning. To break in new shoes, wear them for just a few hours  a day. This prevents you from injuring your feet. Always look in your shoes before you put them on to be sure there are no objects inside.  Do not cross your legs. This may decrease the blood flow to your feet.  If you find a minor scrape, cut, or break in the skin on your feet, keep it and the skin around it clean and dry. These areas may be cleansed with mild soap and water. Do not cleanse the area with peroxide, alcohol, or iodine.  When you remove an adhesive bandage, be sure not to damage the skin around it.  If you have a wound, look at it several times a day to make sure it is healing.  Do not use heating pads or hot water bottles. They may burn your skin. If you have lost feeling in your feet or legs, you may not know it is happening until it is too late.  Make sure your health care provider performs a complete foot exam at least annually or more often if you have foot problems. Report any cuts, sores, or bruises to your health care provider immediately. SEEK MEDICAL CARE IF:   You have an injury that is not healing.  You have cuts or breaks in the skin.  You have an ingrown nail.  You notice redness on your legs or feet.  You feel burning or tingling in your legs or feet.  You have pain or cramps in your legs and feet.  Your legs or feet are numb.  Your feet always feel cold. SEEK IMMEDIATE MEDICAL CARE IF:   There is increasing redness, swelling, or pain in or around a wound.  There is a red line that goes up your leg.  Pus is coming from a wound.  You develop a fever or as directed by your health care provider.  You notice a bad smell coming from an ulcer or wound.   This information is not intended to replace advice given to you by your health care provider. Make sure you discuss any questions you have with your health care provider.   Document Released: 12/21/1999 Document Revised: 08/25/2012 Document Reviewed: 06/01/2012 Elsevier Interactive Patient  Education [employer].

## 2015-06-28 NOTE — Progress Notes (Signed)
Patient ID: [name], female   DOB: 1940-03-09, 75 y.o.   MRN: [id number]  Subjective: This patient presents today after seeing Dr. Cannon Kettle on the visit of 06/12/2015. At that time Dr. Cannon Kettle debrided calluses and nails and dispensed a crest pad for the fourth right toe. Patient is complaining of discomfort in the distal toes and apparently has misplaced the toe crest  Chart review from Dr. Leeanne Rio visit 06/12/2015  Objective: General: Patient is awake, alert, and oriented x 3 and in no acute distress.  Integument: Skin is warm, dry and supple bilateral. Nails are tender, long, thickened and  dystrophic with subungual debris, consistent with onychomycosis, 1-5 bilateral. No open lesions. + callus/ preulcerative lesions present left plantar forefoot and at right 4th toe distal tuft (mostly involved), with no signs of infection. Remaining integument unremarkable.  Vasculature: Dorsalis Pedis pulse 1/4 bilateral. Posterior Tibial pulse 1/4 bilateral.  Capillary fill time <3 sec 1-5 bilateral. Scant hair growth to the level of the digits. Temperature gradient within normal limits. No varicosities present bilateral. No edema present bilateral.   Neurology: The patient has diminished sensation measured with a 5.07/10g Semmes Weinstein Monofilament at all pedal sites bilateral . Vibratory sensation diminished bilateral with tuning fork. No Babinski sign present bilateral.   Musculoskeletal: Bunion and hammertoe pedal deformities noted bilateral. Pes planus foot type. Muscular strength 5/5 in all lower extremity muscular groups bilateral without pain on range of motion . No tenderness with calf compression bilateral.  Assessment and Plan: Problem List Items Addressed This Visit    None    Visit Diagnoses    Dermatophytosis of nail - Primary    Corns and callosities     Hammer toe, unspecified laterality     Bunion, unspecified laterality     Type 2  diabetes mellitus with diabetic neuropathy, with long-term current use of insulin (HCC)     Pain of toe, unspecified laterality       -Examined patient. -Discussed and educated patient on diabetic foot care, especially with  regards to the vascular, neurological and musculoskeletal systems.  -Stressed the importance of good glycemic control and the detriment of not  controlling glucose levels in relation to the foot. -Mechanically debrided all nails 1-5 bilateral using sterile nail nipper and filed with dremel without incident  -Debrided callus at right 4th toe and left plantar forefoot using sterile chisel blade without incident -Dispensed toe crest pad for right 4th toe -Answered all patient questions -Patient to return as in 3 months for at risk foot care -Patient advised to call the office if any problems or questions arise in the meantime.  [name], DPM      Exam today is consistent with above findings Significant hammertoe 2-4 bilaterally  Assessment: Hammertoes 2-4 bilaterally causing discomfort and distal toes  Plan: Today I reviewed the results of the exam and advised patient that further debridement would provide no relief. Again I dispensed toe crest 2 right and left with instructions to wear these at all times when standing walking  Reappoint at patient's request

## 2015-07-04 DIAGNOSIS — M1711 Unilateral primary osteoarthritis, right knee: Secondary | ICD-10-CM | POA: Diagnosis not present

## 2015-07-06 ENCOUNTER — Encounter: Payer: Self-pay | Admitting: Sports Medicine

## 2015-07-11 DIAGNOSIS — M1711 Unilateral primary osteoarthritis, right knee: Secondary | ICD-10-CM | POA: Diagnosis not present

## 2015-07-17 DIAGNOSIS — M1711 Unilateral primary osteoarthritis, right knee: Secondary | ICD-10-CM | POA: Diagnosis not present

## 2015-08-06 DIAGNOSIS — M797 Fibromyalgia: Secondary | ICD-10-CM | POA: Diagnosis not present

## 2015-08-06 DIAGNOSIS — M069 Rheumatoid arthritis, unspecified: Secondary | ICD-10-CM | POA: Diagnosis not present

## 2015-08-06 DIAGNOSIS — G894 Chronic pain syndrome: Secondary | ICD-10-CM | POA: Diagnosis not present

## 2015-08-06 DIAGNOSIS — M4810 Ankylosing hyperostosis [Forestier], site unspecified: Secondary | ICD-10-CM | POA: Diagnosis not present

## 2015-08-06 DIAGNOSIS — E039 Hypothyroidism, unspecified: Secondary | ICD-10-CM | POA: Diagnosis not present

## 2015-08-07 DIAGNOSIS — M2550 Pain in unspecified joint: Secondary | ICD-10-CM | POA: Diagnosis not present

## 2015-08-07 DIAGNOSIS — Z79899 Other long term (current) drug therapy: Secondary | ICD-10-CM | POA: Diagnosis not present

## 2015-08-07 DIAGNOSIS — M1A00X0 Idiopathic chronic gout, unspecified site, without tophus (tophi): Secondary | ICD-10-CM | POA: Diagnosis not present

## 2015-08-07 DIAGNOSIS — M0579 Rheumatoid arthritis with rheumatoid factor of multiple sites without organ or systems involvement: Secondary | ICD-10-CM | POA: Diagnosis not present

## 2015-08-07 DIAGNOSIS — R600 Localized edema: Secondary | ICD-10-CM | POA: Diagnosis not present

## 2015-08-10 ENCOUNTER — Other Ambulatory Visit (HOSPITAL_COMMUNITY): Payer: Self-pay | Admitting: *Deleted

## 2015-08-13 ENCOUNTER — Ambulatory Visit (HOSPITAL_COMMUNITY)
Admission: RE | Admit: 2015-08-13 | Discharge: 2015-08-13 | Disposition: A | Discharge status: Home / Self Care | Payer: Medicare Other | Source: Ambulatory Visit | Attending: Rheumatology | Admitting: Rheumatology

## 2015-08-13 DIAGNOSIS — M0579 Rheumatoid arthritis with rheumatoid factor of multiple sites without organ or systems involvement: Secondary | ICD-10-CM | POA: Diagnosis not present

## 2015-08-13 MED ORDER — DIPHENHYDRAMINE HCL 25 MG PO TABS
25.0000 mg | ORAL_TABLET | ORAL | Status: DC
  Filled 2015-08-13: qty 1

## 2015-08-13 MED ORDER — SODIUM CHLORIDE 0.9 % IV SOLN
750.0000 mg | INTRAVENOUS | Status: DC
  Administered 2015-08-13: 750 mg via INTRAVENOUS
  Filled 2015-08-13: qty 30

## 2015-08-13 MED ORDER — SODIUM CHLORIDE 0.9 % IV SOLN
INTRAVENOUS | Status: DC
  Administered 2015-08-13: 13:00:00 via INTRAVENOUS

## 2015-08-13 MED ORDER — ACETAMINOPHEN 325 MG PO TABS: 650.0000 mg | ORAL_TABLET | ORAL | Status: DC

## 2015-08-16 DIAGNOSIS — R32 Unspecified urinary incontinence: Secondary | ICD-10-CM | POA: Diagnosis not present

## 2015-08-16 DIAGNOSIS — N3000 Acute cystitis without hematuria: Secondary | ICD-10-CM | POA: Diagnosis not present

## 2015-09-06 ENCOUNTER — Other Ambulatory Visit (HOSPITAL_COMMUNITY): Payer: Self-pay | Admitting: Rheumatology

## 2015-09-06 ENCOUNTER — Ambulatory Visit (HOSPITAL_COMMUNITY)
Admission: RE | Admit: 2015-09-06 | Discharge: 2015-09-06 | Disposition: A | Discharge status: Home / Self Care | Payer: Medicare Other | Source: Ambulatory Visit | Attending: Rheumatology | Admitting: Rheumatology

## 2015-09-06 DIAGNOSIS — R7611 Nonspecific reaction to tuberculin skin test without active tuberculosis: Secondary | ICD-10-CM | POA: Diagnosis not present

## 2015-09-06 DIAGNOSIS — Z9225 Personal history of immunosupression therapy: Secondary | ICD-10-CM | POA: Insufficient documentation

## 2015-09-06 DIAGNOSIS — Z5189 Encounter for other specified aftercare: Secondary | ICD-10-CM

## 2015-09-06 DIAGNOSIS — D6481 Anemia due to antineoplastic chemotherapy: Secondary | ICD-10-CM | POA: Diagnosis not present

## 2015-09-07 ENCOUNTER — Other Ambulatory Visit (HOSPITAL_COMMUNITY): Payer: Self-pay | Admitting: *Deleted

## 2015-09-11 ENCOUNTER — Emergency Department (HOSPITAL_COMMUNITY): Payer: Medicare Other

## 2015-09-11 ENCOUNTER — Ambulatory Visit (HOSPITAL_COMMUNITY): Admission: RE | Admit: 2015-09-11 | Payer: Medicare Other | Source: Ambulatory Visit

## 2015-09-11 ENCOUNTER — Encounter (HOSPITAL_COMMUNITY): Payer: Self-pay | Admitting: *Deleted

## 2015-09-11 DIAGNOSIS — Z87891 Personal history of nicotine dependence: Secondary | ICD-10-CM | POA: Insufficient documentation

## 2015-09-11 DIAGNOSIS — Z794 Long term (current) use of insulin: Secondary | ICD-10-CM | POA: Diagnosis not present

## 2015-09-11 DIAGNOSIS — Z96652 Presence of left artificial knee joint: Secondary | ICD-10-CM | POA: Insufficient documentation

## 2015-09-11 DIAGNOSIS — E1140 Type 2 diabetes mellitus with diabetic neuropathy, unspecified: Secondary | ICD-10-CM | POA: Insufficient documentation

## 2015-09-11 DIAGNOSIS — N179 Acute kidney failure, unspecified: Secondary | ICD-10-CM | POA: Insufficient documentation

## 2015-09-11 DIAGNOSIS — I509 Heart failure, unspecified: Secondary | ICD-10-CM | POA: Diagnosis not present

## 2015-09-11 DIAGNOSIS — M25552 Pain in left hip: Secondary | ICD-10-CM | POA: Diagnosis not present

## 2015-09-11 DIAGNOSIS — N183 Chronic kidney disease, stage 3 (moderate): Secondary | ICD-10-CM | POA: Insufficient documentation

## 2015-09-11 DIAGNOSIS — I130 Hypertensive heart and chronic kidney disease with heart failure and stage 1 through stage 4 chronic kidney disease, or unspecified chronic kidney disease: Secondary | ICD-10-CM | POA: Insufficient documentation

## 2015-09-11 DIAGNOSIS — E1122 Type 2 diabetes mellitus with diabetic chronic kidney disease: Secondary | ICD-10-CM | POA: Insufficient documentation

## 2015-09-11 DIAGNOSIS — R1032 Left lower quadrant pain: Secondary | ICD-10-CM | POA: Diagnosis not present

## 2015-09-11 DIAGNOSIS — K5730 Diverticulosis of large intestine without perforation or abscess without bleeding: Secondary | ICD-10-CM | POA: Diagnosis not present

## 2015-09-11 DIAGNOSIS — E039 Hypothyroidism, unspecified: Secondary | ICD-10-CM | POA: Insufficient documentation

## 2015-09-11 NOTE — ED Triage Notes (Signed)
Pt c/o left hip pain shooting down left leg starting on Sunday. Pt denies any recent fall or injury to left hip. Pt states she has RA. Pt took hydrocodone without relief.

## 2015-09-12 ENCOUNTER — Emergency Department (HOSPITAL_COMMUNITY)
Admission: EM | Admit: 2015-09-12 | Discharge: 2015-09-12 | Disposition: A | Discharge status: Home / Self Care | Payer: Medicare Other | Attending: Emergency Medicine | Admitting: Emergency Medicine

## 2015-09-12 ENCOUNTER — Emergency Department (HOSPITAL_COMMUNITY): Payer: Medicare Other

## 2015-09-12 DIAGNOSIS — N179 Acute kidney failure, unspecified: Secondary | ICD-10-CM | POA: Diagnosis not present

## 2015-09-12 DIAGNOSIS — R1032 Left lower quadrant pain: Secondary | ICD-10-CM

## 2015-09-12 DIAGNOSIS — K5730 Diverticulosis of large intestine without perforation or abscess without bleeding: Secondary | ICD-10-CM | POA: Diagnosis not present

## 2015-09-12 LAB — CBC
HCT: 29.8 % — ABNORMAL LOW (ref 36.0–46.0)
HEMOGLOBIN: 9.6 g/dL — AB (ref 12.0–15.0)
MCH: 27.4 pg (ref 26.0–34.0)
MCHC: 32.2 g/dL (ref 30.0–36.0)
MCV: 85.1 fL (ref 78.0–100.0)
Platelets: 285 10*3/uL (ref 150–400)
RBC: 3.5 MIL/uL — AB (ref 3.87–5.11)
RDW: 15.7 % — ABNORMAL HIGH (ref 11.5–15.5)
WBC: 9.7 10*3/uL (ref 4.0–10.5)

## 2015-09-12 LAB — URINALYSIS, ROUTINE W REFLEX MICROSCOPIC
BILIRUBIN URINE: NEGATIVE
Glucose, UA: NEGATIVE mg/dL
Hgb urine dipstick: NEGATIVE
Ketones, ur: NEGATIVE mg/dL
NITRITE: NEGATIVE
Protein, ur: NEGATIVE mg/dL
SPECIFIC GRAVITY, URINE: 1.023 (ref 1.005–1.030)
pH: 5 (ref 5.0–8.0)

## 2015-09-12 LAB — URINE MICROSCOPIC-ADD ON

## 2015-09-12 LAB — BASIC METABOLIC PANEL
Anion gap: 14 (ref 5–15)
BUN: 22 mg/dL — ABNORMAL HIGH (ref 6–20)
CHLORIDE: 103 mmol/L (ref 101–111)
CO2: 20 mmol/L — ABNORMAL LOW (ref 22–32)
Calcium: 9 mg/dL (ref 8.9–10.3)
Creatinine, Ser: 2.45 mg/dL — ABNORMAL HIGH (ref 0.44–1.00)
GFR calc non Af Amer: 18 mL/min — ABNORMAL LOW (ref >60)
GFR, EST AFRICAN AMERICAN: 21 mL/min — AB (ref >60)
Glucose, Bld: 128 mg/dL — ABNORMAL HIGH (ref 65–99)
POTASSIUM: 4 mmol/L (ref 3.5–5.1)
SODIUM: 137 mmol/L (ref 135–145)

## 2015-09-12 MED ORDER — SODIUM CHLORIDE 0.9 % IV BOLUS (SEPSIS)
500.0000 mL | Freq: Once | INTRAVENOUS | Status: AC
  Administered 2015-09-12: 500 mL via INTRAVENOUS

## 2015-09-12 MED ORDER — ONDANSETRON HCL 4 MG/2ML IJ SOLN
4.0000 mg | Freq: Once | INTRAMUSCULAR | Status: AC
  Administered 2015-09-12: 4 mg via INTRAVENOUS
  Filled 2015-09-12: qty 2

## 2015-09-12 MED ORDER — MORPHINE SULFATE (PF) 4 MG/ML IV SOLN
4.0000 mg | Freq: Once | INTRAVENOUS | Status: AC
  Administered 2015-09-12: 4 mg via INTRAVENOUS
  Filled 2015-09-12: qty 1

## 2015-09-12 MED ORDER — SODIUM CHLORIDE 0.9 % IV BOLUS (SEPSIS)
500.0000 mL | Freq: Once | INTRAVENOUS | Status: AC
  Administered 2015-09-12: 12:00:00 via INTRAVENOUS

## 2015-09-12 NOTE — ED Notes (Signed)
Pt assisited up to [facility] to void spec obtained and sent to lab, family remains  In room

## 2015-09-12 NOTE — Discharge Instructions (Addendum)
It was our pleasure to provide your ER care today - we hope that you feel better.  From today's lab tests, your kidney function tests are elevated (creatinine 2.45) - drink plenty of fluids.  Avoid taking any anti-inflammatory type of pain medication such as ibuprofen, motrin, aleve, naprosyn, etc.  Also stop/hold your other medications as specific in your discharge instructions (losartan, allopurinol, etc)- discuss whether to re-start these medications at follow up with your doctor tomorrow.   Follow up with your doctor/[facility] tomorrow for recheck, and repeat of your kidney function tests.   Return to ER if worse, new symptoms, fevers, severe pain, other concern.  You were given pain medication in the ER - no driving for the next 4 hours.

## 2015-09-12 NOTE — ED Notes (Signed)
Introduced self to family, husband son in [employer] and daughter in room. Family reports pts speech is slurred. Pt is aware of who people are in room and that today is wed, after first saying Tuesday, but worked it out with prompts from nurse

## 2015-09-12 NOTE — ED Notes (Signed)
Told us she just finished meds for UTI 2 weeks ago

## 2015-09-12 NOTE — ED Notes (Signed)
Pt informed need of urine sample. Pt states she is unable to pee at this time.

## 2015-09-12 NOTE — ED Notes (Signed)
Pt encouraged to attempt urinating, pt refused bedpan and family states pt is mobile at home. Pt requested to walk to bathroom. Will confirm with RN before ambulating pt due to CC

## 2015-09-12 NOTE — ED Notes (Signed)
Pt given mouth swab.

## 2015-09-12 NOTE — ED Notes (Signed)
Family remains at bedside, pt has gone to ct

## 2015-09-12 NOTE — ED Notes (Signed)
Iv d/c it was bent and would not run , pt given more gingerale, dr aware states do not restart iv

## 2015-09-12 NOTE — ED Provider Notes (Addendum)
[facility] DEPT Provider Note   CSN: [id number] Arrival date & time: 09/11/15  2305     History   Chief Complaint Chief Complaint  Patient presents with  . Hip Pain  . Leg Pain    HPI [name] is a 75 y.o. female.  Patient c/o left lower abdominal pain for the past few days. Pain constant, dull, mod-sev, worsening. Pain worse w palpation. Does not radiate.  Denies hx same pain. +prior hx diverticulitis, pt indicates unsure if similar. No back or flank pain. No fever or chills. Nausea. No vomiting or diarrhea. Having normal bms. No dysuria or hematuria. No trauma/fall.   Patient does report being txd for uti 2 weeks ago by pcp, completed abx rx, no current urinary symptoms.    The history is provided by the patient, the spouse and a relative.  Hip Pain  Associated symptoms include abdominal pain. Pertinent negatives include no chest pain, no headaches and no shortness of breath.  Leg Pain      Past Medical History:  Diagnosis Date  . Allergic rhinitis   . Anemia   . Aortic stenosis 12/22/2012   Mild 3/14-2.35 m/s peak velocity  . Chronic pain   . CKD (chronic kidney disease), stage III   . Diabetes mellitus   . Diverticulosis   . DVT (deep venous thrombosis) ([place])   . Glaucoma   . Hemorrhoids    internal  . HTN (hypertension)   . Hypercholesteremia   . Hyperlipidemia 12/22/2012  . Hypothyroidism   . Meningioma ([place]) 2008  . Osteoarthritis   . Peripheral neuropathy ([place])   . PPD positive    6 months ago  . PVC (premature ventricular contraction) 12/22/2012  . Rheumatoid arthritis(714.0)   . Sickle cell trait ([place])   . 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 Hypoglycemia 08/22/2014  . Neuropathy ([place]) 08/22/2014  . Hypothyroidism 08/22/2014  . Chronic pain 08/22/2014  . 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 Chest pain 08/05/2013  . Aortic stenosis 12/22/2012  . Hyperlipidemia 12/22/2012  . PVC (premature ventricular contraction) 12/22/2012  . HTN (hypertension) 12/22/2012  . Open angle with borderline findings, high risk 12/15/2011  . Cataract, nuclear 05/25/2011    Past Surgical History:  Procedure Laterality Date  . ABDOMINAL HYSTERECTOMY  1978   TAH,& BSO 1  . fistula repair  03/2010   partial colectomy  . intracranial surgery  11/2007   removal of meningioma  . JOINT REPLACEMENT  2001   left TKR  . LEFT HEART CATHETERIZATION WITH CORONARY ANGIOGRAM N/A 08/08/2013   Procedure: LEFT HEART CATHETERIZATION WITH CORONARY ANGIOGRAM;  Surgeon: Peter M Martinique, MD;  Location: [facility] CATH LAB;  Service: Cardiovascular;  Laterality: N/A;  . ROTATOR CUFF REPAIR  1999   BIlateral  . SIGMOIDECTOMY  2012  . TONSILLECTOMY AND ADENOIDECTOMY    . TOTAL THYROIDECTOMY  1992  . wrist sx     right     OB History    No data available       Home Medications    Prior to Admission medications   Medication Sig Start Date End Date Taking? Authorizing Provider  abatacept (ORENCIA) 250 MG injection Inject 750 mg into the vein. Infuse 750 mg / 3 vials over 30 minutes every 4 weeks  Historical Provider, MD  acetaminophen (TYLENOL) 325 MG tablet Take 650 mg by mouth once. One hour prior to infusion    Historical Provider, MD  allopurinol (ZYLOPRIM) 300 MG tablet Take 300 mg by mouth daily.    Historical Provider, MD  atorvastatin (LIPITOR) 40 MG tablet Take 40 mg by mouth daily.    Historical Provider, MD  cholecalciferol (VITAMIN D) 1000 UNITS tablet Take 1,000 Units by mouth daily.     Historical Provider, MD  cyclobenzaprine (FLEXERIL) 10 MG tablet Take 10 mg by mouth 3 (three) times daily as needed for muscle spasms.     Historical Provider, MD  diclofenac sodium (VOLTAREN) 1 % GEL Apply 2 g topically 4 (four) times daily as needed (pain). Rub into affected area of foot  2 to 4 times daily 01/19/13   Bronson Ing, DPM  diphenhydrAMINE (BENADRYL) 25 MG tablet Take 25 mg by mouth every 6 (six) hours as needed. One hour prior to infusion    Historical Provider, MD  ferrous fumarate (HEMOCYTE - 106 MG FE) 325 (106 FE) MG TABS Take 1 tablet by mouth daily.     Historical Provider, MD  folic acid (FOLVITE) 1 MG tablet Take 1 mg by mouth daily.    Historical Provider, MD  HYDROcodone-acetaminophen (NORCO/VICODIN) 5-325 MG per tablet Take 1-2 tablets by mouth every 3 (three) hours as needed for moderate pain. 08/26/14   Theodis Blaze, MD  insulin NPH-regular Human (NOVOLIN 70/30) (70-30) 100 UNIT/ML injection Inject 4 Units into the skin daily as needed (high blood sugar).     Historical Provider, MD  levothyroxine (SYNTHROID, LEVOTHROID) 200 MCG tablet Take 175 mcg by mouth daily before breakfast.     Historical Provider, MD  losartan (COZAAR) 50 MG tablet Take 50 mg by mouth daily.  06/29/14   Historical Provider, MD  metoprolol tartrate (LOPRESSOR) 25 MG tablet Take 1 tablet (25 mg total) by mouth 2 (two) times daily as needed. Patient taking differently: Take 25 mg by mouth 2 (two) times daily as needed (heart).  05/07/15   Jerline Pain, MD  multivitamin [facility]) per tablet Take 1 tablet by mouth at bedtime.     Historical Provider, MD  predniSONE (STERAPRED UNI-PAK 21 TAB) 10 MG (21) TBPK tablet Take 10 mg by mouth daily.    Historical Provider, MD  pregabalin (LYRICA) 75 MG capsule Take 75-150 mg by mouth 2 (two) times daily. Take 1 capsule in the am and Take 2 capsules at bedtime.    Historical Provider, MD    Family History Family History  Problem Relation Age of Onset  . Hypotension Mother   . CVA Mother   . Diabetes Mother   . Hypertension Father   . Kidney failure Father     Social History Social History  Substance Use Topics  . Smoking status: Former Smoker    Quit date: 01/06/1962  . Smokeless tobacco: Never Used  . Alcohol use Yes     Comment:  occasionally     Allergies   Penicillins; Methotrexate; Methotrexate derivatives; Other; Enbrel [etanercept]; Remicade [infliximab]; and Zocor [simvastatin]   Review of Systems Review of Systems  Constitutional: Negative for fever.  HENT: Negative for sore throat.   Eyes: Negative for redness.  Respiratory: Negative for cough and shortness of breath.   Cardiovascular: Negative for chest pain.  Gastrointestinal: Positive for abdominal pain.  Genitourinary: Negative for dysuria, flank pain and hematuria.  Musculoskeletal: Negative for back pain and neck pain.  Skin: Negative for rash.  Neurological: Negative for headaches.  Hematological: Does not bruise/bleed easily.  Psychiatric/Behavioral: Negative for confusion.     Physical Exam Updated Vital Signs BP (!) 106/49   Pulse 71   Temp 97.9 F (36.6 C) (Oral)   Resp 24   SpO2 100%   Physical Exam  Constitutional: She appears well-developed and well-nourished. No distress.  HENT:  Mouth/Throat: Oropharynx is clear and moist.  Eyes: Conjunctivae are normal. No scleral icterus.  Neck: Neck supple. No tracheal deviation present.  Cardiovascular: Normal rate, regular rhythm, normal heart sounds and intact distal pulses.   Pulmonary/Chest: Effort normal and breath sounds normal. No respiratory distress.  Abdominal: Soft. Normal appearance and bowel sounds are normal. She exhibits no distension and no mass. There is tenderness. There is no rebound. No hernia.  Moderate LLQ tenderness  Genitourinary:  Genitourinary Comments: No cva tenderness  Musculoskeletal: She exhibits no edema.  Good rom left hip without pain  Neurological: She is alert.  Skin: Skin is warm and dry. No rash noted. She is not diaphoretic.  No shingles/rash to area of pain  Psychiatric: She has a normal mood and affect.  Nursing note and vitals reviewed.    ED Treatments / Results  Labs (all labs ordered are listed, but only abnormal results are  displayed) Results for orders placed or performed during the hospital encounter of 09/12/15  CBC  Result Value Ref Range   WBC 9.7 4.0 - 10.5 K/uL   RBC 3.50 (L) 3.87 - 5.11 MIL/uL   Hemoglobin 9.6 (L) 12.0 - 15.0 g/dL   HCT 29.8 (L) 36.0 - 46.0 %   MCV 85.1 78.0 - 100.0 fL   MCH 27.4 26.0 - 34.0 pg   MCHC 32.2 30.0 - 36.0 g/dL   RDW 15.7 (H) 11.5 - 15.5 %   Platelets 285 150 - 400 K/uL  Basic metabolic panel  Result Value Ref Range   Sodium 137 135 - 145 mmol/L   Potassium 4.0 3.5 - 5.1 mmol/L   Chloride 103 101 - 111 mmol/L   CO2 20 (L) 22 - 32 mmol/L   Glucose, Bld 128 (H) 65 - 99 mg/dL   BUN 22 (H) 6 - 20 mg/dL   Creatinine, Ser 2.45 (H) 0.44 - 1.00 mg/dL   Calcium 9.0 8.9 - 10.3 mg/dL   GFR calc non Af Amer 18 (L) >60 mL/min   GFR calc Af Amer 21 (L) >60 mL/min   Anion gap 14 5 - 15   Ct Abdomen Pelvis Wo Contrast  Result Date: 09/12/2015 CLINICAL DATA:  Left lower quadrant abdominal pain. No time course given. EXAM: CT ABDOMEN AND PELVIS WITHOUT CONTRAST TECHNIQUE: Multidetector CT imaging of the abdomen and pelvis was performed following the standard protocol without IV contrast. COMPARISON:  None. FINDINGS: Lower chest: Streaky D dependent bibasilar atelectasis. No infiltrates or effusions. The heart is normal in size. No pericardial effusion. Coronary artery calcifications are noted. The distal esophagus is grossly normal. Hepatobiliary: Small left hepatic lobe cysts but no worrisome hepatic lesions or intrahepatic biliary dilatation. The gallbladder is normal. No common bile duct dilatation. Pancreas: No mass, inflammation or ductal dilatation. Spleen: Normal size.  No focal lesions. Adrenals/Urinary Tract: The adrenal glands are normal. Simple upper pole right renal cyst. No worrisome renal lesions, hydronephrosis or obstructing ureteral calculi. No bladder calculi or bladder mass. Stomach/Bowel: The stomach, duodenum, small bowel and colon are grossly normal without oral  contrast. No inflammatory changes, mass lesions  or obstructive findings. There are surgical changes noted in the right lower quadrant from a prior bowel resection. The terminal ileum is normal. Vascular/Lymphatic: No mesenteric or retroperitoneal mass or adenopathy. Moderate scattered atherosclerotic calcifications and mild tortuosity of the aorta and iliac arteries. There are small scattered mesenteric and retroperitoneal lymph nodes but no mass or overt adenopathy. Other: Status post hysterectomy. No pelvic mass or adenopathy. No free pelvic fluid collections. No inguinal mass or adenopathy. No abdominal wall hernia or subcutaneous lesions. Musculoskeletal: No acute bony findings. Degenerative changes noted throughout the spine. IMPRESSION: No acute abdominal/ pelvic findings, mass lesions or lymphadenopathy. Sigmoid diverticulosis without findings for acute diverticulitis. Aortoiliac vascular calcifications but no aneurysm. Coronary artery calcifications are noted. Electronically Signed   By: Marijo Sanes M.D.   On: 09/12/2015 09:53   Dg Chest 2 View  Result Date: 09/06/2015 CLINICAL DATA:  Anemia therapy.  No chest complaints. EXAM: CHEST  2 VIEW COMPARISON:  05/30/2015 FINDINGS: The heart size and mediastinal contours are within normal limits. Both lungs are clear. The visualized skeletal structures are unremarkable. IMPRESSION: No active cardiopulmonary disease. Electronically Signed   By: Kathreen Devoid   On: 09/06/2015 15:35   Dg Hip Unilat W Or Wo Pelvis 2-3 Views Left  Result Date: 09/12/2015 CLINICAL DATA:  75 year old female with chronic left hip pain. EXAM: DG HIP (WITH OR WITHOUT PELVIS) 2-3V LEFT COMPARISON:  None. FINDINGS: There is no acute fracture or dislocation. There is mild osteopenia. Bilateral osteoarthritic changes. The soft tissues appear unremarkable. IMPRESSION: No acute fracture or dislocation.  Osteoarthritis. Electronically Signed   By: Anner Crete M.D.   On: 09/12/2015  00:10    EKG  EKG Interpretation None       Radiology Ct Abdomen Pelvis Wo Contrast  Result Date: 09/12/2015 CLINICAL DATA:  Left lower quadrant abdominal pain. No time course given. EXAM: CT ABDOMEN AND PELVIS WITHOUT CONTRAST TECHNIQUE: Multidetector CT imaging of the abdomen and pelvis was performed following the standard protocol without IV contrast. COMPARISON:  None. FINDINGS: Lower chest: Streaky D dependent bibasilar atelectasis. No infiltrates or effusions. The heart is normal in size. No pericardial effusion. Coronary artery calcifications are noted. The distal esophagus is grossly normal. Hepatobiliary: Small left hepatic lobe cysts but no worrisome hepatic lesions or intrahepatic biliary dilatation. The gallbladder is normal. No common bile duct dilatation. Pancreas: No mass, inflammation or ductal dilatation. Spleen: Normal size.  No focal lesions. Adrenals/Urinary Tract: The adrenal glands are normal. Simple upper pole right renal cyst. No worrisome renal lesions, hydronephrosis or obstructing ureteral calculi. No bladder calculi or bladder mass. Stomach/Bowel: The stomach, duodenum, small bowel and colon are grossly normal without oral contrast. No inflammatory changes, mass lesions or obstructive findings. There are surgical changes noted in the right lower quadrant from a prior bowel resection. The terminal ileum is normal. Vascular/Lymphatic: No mesenteric or retroperitoneal mass or adenopathy. Moderate scattered atherosclerotic calcifications and mild tortuosity of the aorta and iliac arteries. There are small scattered mesenteric and retroperitoneal lymph nodes but no mass or overt adenopathy. Other: Status post hysterectomy. No pelvic mass or adenopathy. No free pelvic fluid collections. No inguinal mass or adenopathy. No abdominal wall hernia or subcutaneous lesions. Musculoskeletal: No acute bony findings. Degenerative changes noted throughout the spine. IMPRESSION: No acute  abdominal/ pelvic findings, mass lesions or lymphadenopathy. Sigmoid diverticulosis without findings for acute diverticulitis. Aortoiliac vascular calcifications but no aneurysm. Coronary artery calcifications are noted. Electronically Signed   By: Ricky Stabs.D.  On: 09/12/2015 09:53   Dg Hip Unilat W Or Wo Pelvis 2-3 Views Left  Result Date: 09/12/2015 CLINICAL DATA:  74 year old female with chronic left hip pain. EXAM: DG HIP (WITH OR WITHOUT PELVIS) 2-3V LEFT COMPARISON:  None. FINDINGS: There is no acute fracture or dislocation. There is mild osteopenia. Bilateral osteoarthritic changes. The soft tissues appear unremarkable. IMPRESSION: No acute fracture or dislocation.  Osteoarthritis. Electronically Signed   By: Anner Crete M.D.   On: 09/12/2015 00:10    Procedures Procedures (including critical care time)  Medications Ordered in ED Medications  morphine 4 MG/ML injection 4 mg (not administered)  ondansetron (ZOFRAN) injection 4 mg (not administered)  sodium chloride 0.9 % bolus 500 mL (not administered)     Initial Impression / Assessment and Plan / ED Course  I have reviewed the triage vital signs and the nursing notes.  Pertinent labs & imaging results that were available during my care of the patient were reviewed by me and considered in my medical decision making (see chart for details).  Clinical Course  From record review and talking to RN, ?why such long LOS before being placed into room this AM - per report, they were calling for patient in waiting room and could not locate patient, feeling she may have left. ?whether pt was sleeping in waiting room.   Iv ns bolus. Morphine iv. zofran iv.  Labs and CT.    AKI on labs - ct changed to without contrast.   Reviewed nursing notes and prior charts for additional history.   From todays labs, cr 2.45, previous renal fxn normal.  Patients med list reviewed.  Family also states takes aleve and motrin for RA as  well.  Given AKI, will admit/obs, for iv hydration, holding any renal insulting meds, and repeat bmet.    Ct neg for acute abd process.  Discussed with Hospitalists - they reviewed pt/chart, and requests d/c, w holding of nsaids, and pcp f/u tomorrow for repeat bmet (and if not improved, pcp can do additional eval then).   Discussed plan w pt, pt agreeable.       Final Clinical Impressions(s) / ED Diagnoses   Final diagnoses:  None    New Prescriptions New Prescriptions   No medications on file       Lajean Saver, MD 09/12/15 [place], MD 09/12/15 1112

## 2015-09-14 DIAGNOSIS — Z23 Encounter for immunization: Secondary | ICD-10-CM | POA: Diagnosis not present

## 2015-09-14 DIAGNOSIS — R1012 Left upper quadrant pain: Secondary | ICD-10-CM | POA: Diagnosis not present

## 2015-09-14 DIAGNOSIS — N179 Acute kidney failure, unspecified: Secondary | ICD-10-CM | POA: Diagnosis not present

## 2015-09-18 DIAGNOSIS — N179 Acute kidney failure, unspecified: Secondary | ICD-10-CM | POA: Diagnosis not present

## 2015-09-20 ENCOUNTER — Other Ambulatory Visit (HOSPITAL_COMMUNITY): Payer: Self-pay | Admitting: *Deleted

## 2015-09-21 ENCOUNTER — Ambulatory Visit (HOSPITAL_COMMUNITY): Admission: RE | Admit: 2015-09-21 | Payer: Medicare Other | Source: Ambulatory Visit

## 2015-09-24 ENCOUNTER — Ambulatory Visit: Payer: Medicare Other | Admitting: Sports Medicine

## 2015-09-25 ENCOUNTER — Ambulatory Visit: Payer: Medicare Other | Admitting: Sports Medicine

## 2015-10-05 ENCOUNTER — Ambulatory Visit (HOSPITAL_COMMUNITY)
Admission: RE | Admit: 2015-10-05 | Discharge: 2015-10-05 | Disposition: A | Discharge status: Home / Self Care | Payer: Medicare Other | Source: Ambulatory Visit | Attending: Rheumatology | Admitting: Rheumatology

## 2015-10-05 DIAGNOSIS — M0579 Rheumatoid arthritis with rheumatoid factor of multiple sites without organ or systems involvement: Secondary | ICD-10-CM | POA: Diagnosis not present

## 2015-10-05 DIAGNOSIS — Z794 Long term (current) use of insulin: Secondary | ICD-10-CM | POA: Diagnosis not present

## 2015-10-05 DIAGNOSIS — E1142 Type 2 diabetes mellitus with diabetic polyneuropathy: Secondary | ICD-10-CM | POA: Diagnosis not present

## 2015-10-05 MED ORDER — SODIUM CHLORIDE 0.9 % IV SOLN
750.0000 mg | INTRAVENOUS | Status: DC
  Administered 2015-10-05: 750 mg via INTRAVENOUS
  Filled 2015-10-05: qty 10

## 2015-10-05 MED ORDER — DIPHENHYDRAMINE HCL 25 MG PO CAPS
25.0000 mg | ORAL_CAPSULE | ORAL | Status: DC
Start: 2015-10-05 — End: 2015-10-06
  Administered 2015-10-05: 25 mg via ORAL

## 2015-10-05 MED ORDER — DIPHENHYDRAMINE HCL 25 MG PO CAPS
ORAL_CAPSULE | ORAL | Status: AC
  Filled 2015-10-05: qty 1

## 2015-10-05 MED ORDER — ACETAMINOPHEN 325 MG PO TABS
ORAL_TABLET | ORAL | Status: AC
  Filled 2015-10-05: qty 2

## 2015-10-05 MED ORDER — ACETAMINOPHEN 325 MG PO TABS
650.0000 mg | ORAL_TABLET | ORAL | Status: DC
Start: 2015-10-05 — End: 2015-10-06
  Administered 2015-10-05: 650 mg via ORAL

## 2015-10-05 MED ORDER — SODIUM CHLORIDE 0.9 % IV SOLN
INTRAVENOUS | Status: DC
Start: 2015-10-05 — End: 2015-10-06
  Administered 2015-10-05: 13:00:00 via INTRAVENOUS

## 2015-10-09 ENCOUNTER — Encounter: Payer: Self-pay | Admitting: Sports Medicine

## 2015-10-09 ENCOUNTER — Ambulatory Visit (INDEPENDENT_AMBULATORY_CARE_PROVIDER_SITE_OTHER): Payer: Medicare Other | Admitting: Sports Medicine

## 2015-10-09 DIAGNOSIS — M2040 Other hammer toe(s) (acquired), unspecified foot: Secondary | ICD-10-CM

## 2015-10-09 DIAGNOSIS — M79676 Pain in unspecified toe(s): Secondary | ICD-10-CM

## 2015-10-09 DIAGNOSIS — B351 Tinea unguium: Secondary | ICD-10-CM

## 2015-10-09 DIAGNOSIS — Z794 Long term (current) use of insulin: Secondary | ICD-10-CM

## 2015-10-09 DIAGNOSIS — L84 Corns and callosities: Secondary | ICD-10-CM

## 2015-10-09 DIAGNOSIS — E1140 Type 2 diabetes mellitus with diabetic neuropathy, unspecified: Secondary | ICD-10-CM

## 2015-10-09 NOTE — Progress Notes (Signed)
Patient ID: [name], female   DOB: 03/04/1940, 75 y.o.   MRN: [id number]  Subjective: [name] is a 75 y.o. female patient with history of type 2 diabetes who returns to office today complaining of long, painful nails and callus while ambulating in shoes; unable to trim. Patient states that the glucose reading this morning was not recorded but good. Patient denies any new changes in medication or new problems. Patient denies any new cramping, numbness, burning or tingling in the legs.  Patient Active Problem List   Diagnosis Date Noted  . Hypoglycemia 08/22/2014  . Neuropathy ([place]) 08/22/2014  . Hypothyroidism 08/22/2014  . Chronic pain 08/22/2014  . 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 Chest pain 08/05/2013  . Aortic stenosis 12/22/2012  . Hyperlipidemia 12/22/2012  . PVC (premature ventricular contraction) 12/22/2012  . HTN (hypertension) 12/22/2012  . Open angle with borderline findings, high risk 12/15/2011  . Cataract, nuclear 05/25/2011   Current Outpatient Prescriptions on File Prior to Visit  Medication Sig Dispense Refill  . abatacept (ORENCIA) 250 MG injection Inject 750 mg into the vein. Infuse 750 mg / 3 vials over 30 minutes every 4 weeks    . acetaminophen (TYLENOL) 500 MG tablet Take 1,000 mg by mouth every 6 (six) hours as needed for mild pain.    Marland Kitchen atorvastatin (LIPITOR) 40 MG tablet Take 40 mg by mouth daily.    . cholecalciferol (VITAMIN D) 1000 UNITS tablet Take 1,000 Units by mouth daily.     . cyclobenzaprine (FLEXERIL) 10 MG tablet Take 10 mg by mouth 3 (three) times daily as needed for muscle spasms.     . diphenhydrAMINE (BENADRYL) 25 MG tablet Take 25 mg by mouth every 6 (six) hours as needed. One hour prior to infusion    . ferrous fumarate (HEMOCYTE - 106 MG FE) 325 (106 FE) MG TABS Take 1 tablet by mouth daily.     . folic acid (FOLVITE) 1 MG tablet Take 1 mg by mouth daily.     Marland Kitchen HYDROcodone-acetaminophen (NORCO/VICODIN) 5-325 MG per tablet Take 1-2 tablets by mouth every 3 (three) hours as needed for moderate pain. 30 tablet 0  . insulin NPH-regular Human (NOVOLIN 70/30) (70-30) 100 UNIT/ML injection Inject 4 Units into the skin daily as needed (high blood sugar).     Marland Kitchen levothyroxine (SYNTHROID, LEVOTHROID) 200 MCG tablet Take 175 mcg by mouth daily before breakfast.     . metoprolol tartrate (LOPRESSOR) 25 MG tablet Take 1 tablet (25 mg total) by mouth 2 (two) times daily as needed. (Patient taking differently: Take 25 mg by mouth 2 (two) times daily as needed (heart). ) 60 tablet 6  . multivitamin (THERAGRAN) per tablet Take 1 tablet by mouth at bedtime.     . pregabalin (LYRICA) 75 MG capsule Take 75-150 mg by mouth 2 (two) times daily. Take 1 capsule in the am and Take 2 capsules at bedtime.     No current facility-administered medications on file prior to visit.    Allergies  Allergen Reactions  . Penicillins Rash    Has patient had a PCN reaction causing immediate rash, facial/tongue/throat swelling, SOB or lightheadedness with hypotension: Yes Has patient had a PCN reaction causing severe rash involving mucus membranes or skin necrosis: Yes  Has patient had a PCN reaction that required hospitalization No Has patient had a PCN reaction occurring within the last 10 years: No If all of the above answers  are "NO", then may proceed with Cephalosporin use.   . Methotrexate Other (See Comments)    Elevated pt.'s blood sugar  . Methotrexate Derivatives Other (See Comments)    Elevated creat. Levels  . Other     NO Blood products. [name]- causes low blood sugar (pt states she was taking medication but cant remember what it was for.)  . Enbrel [Etanercept]     Inadequate response    . Remicade [Infliximab]     Inadequate response   . Zocor [Simvastatin] Other (See Comments)    Leg cramps Leg cramps    Recent Results (from the past 2160 hour(s))   CBC     Status: Abnormal   Collection Time: 09/12/15  8:25 AM  Result Value Ref Range   WBC 9.7 4.0 - 10.5 K/uL   RBC 3.50 (L) 3.87 - 5.11 MIL/uL   Hemoglobin 9.6 (L) 12.0 - 15.0 g/dL   HCT 29.8 (L) 36.0 - 46.0 %   MCV 85.1 78.0 - 100.0 fL   MCH 27.4 26.0 - 34.0 pg   MCHC 32.2 30.0 - 36.0 g/dL   RDW 15.7 (H) 11.5 - 15.5 %   Platelets 285 150 - 400 K/uL  Basic metabolic panel     Status: Abnormal   Collection Time: 09/12/15  8:25 AM  Result Value Ref Range   Sodium 137 135 - 145 mmol/L   Potassium 4.0 3.5 - 5.1 mmol/L   Chloride 103 101 - 111 mmol/L   CO2 20 (L) 22 - 32 mmol/L   Glucose, Bld 128 (H) 65 - 99 mg/dL   BUN 22 (H) 6 - 20 mg/dL   Creatinine, Ser 2.45 (H) 0.44 - 1.00 mg/dL   Calcium 9.0 8.9 - 10.3 mg/dL   GFR calc non Af Amer 18 (L) >60 mL/min   GFR calc Af Amer 21 (L) >60 mL/min    Comment: (NOTE) The eGFR has been calculated using the CKD EPI equation. This calculation has not been validated in all clinical situations. eGFR's persistently <60 mL/min signify possible Chronic Kidney Disease.    Anion gap 14 5 - 15  Urinalysis, Routine w reflex microscopic (not at [facility])     Status: Abnormal   Collection Time: 09/12/15 10:25 AM  Result Value Ref Range   Color, Urine YELLOW YELLOW   APPearance CLOUDY (A) CLEAR   Specific Gravity, Urine 1.023 1.005 - 1.030   pH 5.0 5.0 - 8.0   Glucose, UA NEGATIVE NEGATIVE mg/dL   Hgb urine dipstick NEGATIVE NEGATIVE   Bilirubin Urine NEGATIVE NEGATIVE   Ketones, ur NEGATIVE NEGATIVE mg/dL   Protein, ur NEGATIVE NEGATIVE mg/dL   Nitrite NEGATIVE NEGATIVE   Leukocytes, UA SMALL (A) NEGATIVE  Urine microscopic-add on     Status: Abnormal   Collection Time: 09/12/15 10:25 AM  Result Value Ref Range   Squamous Epithelial / LPF 0-5 (A) NONE SEEN   WBC, UA 0-5 0 - 5 WBC/hpf   RBC / HPF 0-5 0 - 5 RBC/hpf   Bacteria, UA RARE (A) NONE SEEN   Casts HYALINE CASTS (A) NEGATIVE    Objective: General: Patient is awake, alert, and  oriented x 3 and in no acute distress.  Integument: Skin is warm, dry and supple bilateral. Nails are tender, long, thickened and dystrophic with subungual debris, consistent with onychomycosis, 1-5 bilateral. No open lesions. + callus/ preulcerative lesions present left plantar forefoot and at right>left 4th toe distal tuft (mostly involved), with no signs of infection.  Remaining integument unremarkable.  Vasculature:  Dorsalis Pedis pulse 1/4 bilateral. Posterior Tibial pulse  1/4 bilateral. Capillary fill time <3 sec 1-5 bilateral. Scant hair growth to the level of the digits.Temperature gradient within normal limits. No varicosities present bilateral. No edema present bilateral.   Neurology: The patient has diminished sensation measured with a 5.07/10g Semmes Weinstein Monofilament at all pedal sites bilateral . Vibratory sensation diminished bilateral with tuning fork. No Babinski sign present bilateral.   Musculoskeletal: Bunion and hammertoe pedal deformities noted bilateral. Pes planus foot type. Muscular strength 5/5 in all lower extremity muscular groups bilateral without pain on range of motion . No tenderness with calf compression bilateral.  Assessment and Plan: Problem List Items Addressed This Visit    None    Visit Diagnoses    Dermatophytosis of nail    -  Primary   Corns and callosities       Hammer toe, unspecified laterality       Type 2 diabetes mellitus with diabetic neuropathy, with long-term current use of insulin (HCC)       Pain of toe, unspecified laterality         -Examined patient. -Discussed and educated patient on diabetic foot care, especially with  regards to the vascular, neurological and musculoskeletal systems.  -Stressed the importance of good glycemic control and the detriment of not  controlling glucose levels in relation to the foot. -Mechanically debrided all nails 1-5 bilateral using sterile nail nipper and filed with dremel without incident   -Debrided callus at right and left 4th toe and left plantar forefoot using sterile chisel blade without incident -Encouraged patient if continues to be bothersome to consider hammertoe surgery -Continue with crest pad -Answered all patient questions -Patient to return as in 3 months for at risk foot care -Patient advised to call the office if any problems or questions arise in the meantime.  Landis Martins, DPM

## 2015-10-12 DIAGNOSIS — E039 Hypothyroidism, unspecified: Secondary | ICD-10-CM | POA: Diagnosis not present

## 2015-10-12 DIAGNOSIS — Z794 Long term (current) use of insulin: Secondary | ICD-10-CM | POA: Diagnosis not present

## 2015-10-12 DIAGNOSIS — E1142 Type 2 diabetes mellitus with diabetic polyneuropathy: Secondary | ICD-10-CM | POA: Diagnosis not present

## 2015-10-12 DIAGNOSIS — Z6835 Body mass index (BMI) 35.0-35.9, adult: Secondary | ICD-10-CM | POA: Diagnosis not present

## 2015-10-18 ENCOUNTER — Observation Stay (HOSPITAL_COMMUNITY)
Admission: EM | Admit: 2015-10-18 | Discharge: 2015-10-21 | Disposition: A | Discharge status: Home / Self Care | Payer: Medicare Other | Attending: Internal Medicine | Admitting: Internal Medicine

## 2015-10-18 ENCOUNTER — Emergency Department (HOSPITAL_COMMUNITY): Payer: Medicare Other

## 2015-10-18 ENCOUNTER — Ambulatory Visit: Payer: Medicare Other | Admitting: Rheumatology

## 2015-10-18 ENCOUNTER — Encounter (HOSPITAL_COMMUNITY): Payer: Self-pay | Admitting: Emergency Medicine

## 2015-10-18 DIAGNOSIS — I130 Hypertensive heart and chronic kidney disease with heart failure and stage 1 through stage 4 chronic kidney disease, or unspecified chronic kidney disease: Secondary | ICD-10-CM | POA: Diagnosis not present

## 2015-10-18 DIAGNOSIS — I351 Nonrheumatic aortic (valve) insufficiency: Secondary | ICD-10-CM | POA: Diagnosis not present

## 2015-10-18 DIAGNOSIS — Z794 Long term (current) use of insulin: Secondary | ICD-10-CM | POA: Diagnosis not present

## 2015-10-18 DIAGNOSIS — R0789 Other chest pain: Secondary | ICD-10-CM | POA: Diagnosis not present

## 2015-10-18 DIAGNOSIS — N183 Chronic kidney disease, stage 3 unspecified: Secondary | ICD-10-CM | POA: Diagnosis present

## 2015-10-18 DIAGNOSIS — D573 Sickle-cell trait: Secondary | ICD-10-CM | POA: Diagnosis not present

## 2015-10-18 DIAGNOSIS — E1142 Type 2 diabetes mellitus with diabetic polyneuropathy: Secondary | ICD-10-CM | POA: Diagnosis not present

## 2015-10-18 DIAGNOSIS — Z8611 Personal history of tuberculosis: Secondary | ICD-10-CM | POA: Insufficient documentation

## 2015-10-18 DIAGNOSIS — N182 Chronic kidney disease, stage 2 (mild): Secondary | ICD-10-CM

## 2015-10-18 DIAGNOSIS — Z86718 Personal history of other venous thrombosis and embolism: Secondary | ICD-10-CM | POA: Insufficient documentation

## 2015-10-18 DIAGNOSIS — G8929 Other chronic pain: Secondary | ICD-10-CM | POA: Insufficient documentation

## 2015-10-18 DIAGNOSIS — I10 Essential (primary) hypertension: Secondary | ICD-10-CM | POA: Diagnosis present

## 2015-10-18 DIAGNOSIS — Z8249 Family history of ischemic heart disease and other diseases of the circulatory system: Secondary | ICD-10-CM | POA: Insufficient documentation

## 2015-10-18 DIAGNOSIS — N179 Acute kidney failure, unspecified: Secondary | ICD-10-CM | POA: Diagnosis not present

## 2015-10-18 DIAGNOSIS — M069 Rheumatoid arthritis, unspecified: Secondary | ICD-10-CM | POA: Diagnosis present

## 2015-10-18 DIAGNOSIS — I5032 Chronic diastolic (congestive) heart failure: Secondary | ICD-10-CM | POA: Diagnosis present

## 2015-10-18 DIAGNOSIS — I6523 Occlusion and stenosis of bilateral carotid arteries: Secondary | ICD-10-CM | POA: Diagnosis not present

## 2015-10-18 DIAGNOSIS — E119 Type 2 diabetes mellitus without complications: Secondary | ICD-10-CM

## 2015-10-18 DIAGNOSIS — G894 Chronic pain syndrome: Secondary | ICD-10-CM | POA: Diagnosis not present

## 2015-10-18 DIAGNOSIS — Z823 Family history of stroke: Secondary | ICD-10-CM | POA: Insufficient documentation

## 2015-10-18 DIAGNOSIS — I700 Atherosclerosis of aorta: Secondary | ICD-10-CM | POA: Diagnosis not present

## 2015-10-18 DIAGNOSIS — Z880 Allergy status to penicillin: Secondary | ICD-10-CM | POA: Insufficient documentation

## 2015-10-18 DIAGNOSIS — Z87891 Personal history of nicotine dependence: Secondary | ICD-10-CM | POA: Insufficient documentation

## 2015-10-18 DIAGNOSIS — J479 Bronchiectasis, uncomplicated: Secondary | ICD-10-CM | POA: Insufficient documentation

## 2015-10-18 DIAGNOSIS — R072 Precordial pain: Principal | ICD-10-CM | POA: Insufficient documentation

## 2015-10-18 DIAGNOSIS — E1122 Type 2 diabetes mellitus with diabetic chronic kidney disease: Secondary | ICD-10-CM | POA: Insufficient documentation

## 2015-10-18 DIAGNOSIS — R911 Solitary pulmonary nodule: Secondary | ICD-10-CM | POA: Diagnosis not present

## 2015-10-18 DIAGNOSIS — R079 Chest pain, unspecified: Secondary | ICD-10-CM | POA: Diagnosis not present

## 2015-10-18 DIAGNOSIS — E039 Hypothyroidism, unspecified: Secondary | ICD-10-CM | POA: Diagnosis not present

## 2015-10-18 DIAGNOSIS — Z888 Allergy status to other drugs, medicaments and biological substances status: Secondary | ICD-10-CM | POA: Insufficient documentation

## 2015-10-18 DIAGNOSIS — E7800 Pure hypercholesterolemia, unspecified: Secondary | ICD-10-CM | POA: Insufficient documentation

## 2015-10-18 DIAGNOSIS — Z90710 Acquired absence of both cervix and uterus: Secondary | ICD-10-CM | POA: Insufficient documentation

## 2015-10-18 DIAGNOSIS — I2510 Atherosclerotic heart disease of native coronary artery without angina pectoris: Secondary | ICD-10-CM | POA: Diagnosis not present

## 2015-10-18 DIAGNOSIS — I350 Nonrheumatic aortic (valve) stenosis: Secondary | ICD-10-CM | POA: Diagnosis not present

## 2015-10-18 DIAGNOSIS — Z7982 Long term (current) use of aspirin: Secondary | ICD-10-CM | POA: Diagnosis not present

## 2015-10-18 DIAGNOSIS — Z96652 Presence of left artificial knee joint: Secondary | ICD-10-CM | POA: Insufficient documentation

## 2015-10-18 DIAGNOSIS — I493 Ventricular premature depolarization: Secondary | ICD-10-CM | POA: Insufficient documentation

## 2015-10-18 HISTORY — DX: Heart failure, unspecified: I50.9

## 2015-10-18 LAB — BASIC METABOLIC PANEL
ANION GAP: 7 (ref 5–15)
BUN: 25 mg/dL — ABNORMAL HIGH (ref 6–20)
CHLORIDE: 107 mmol/L (ref 101–111)
CO2: 24 mmol/L (ref 22–32)
CREATININE: 1.78 mg/dL — AB (ref 0.44–1.00)
Calcium: 9.2 mg/dL (ref 8.9–10.3)
GFR calc non Af Amer: 27 mL/min — ABNORMAL LOW (ref >60)
GFR, EST AFRICAN AMERICAN: 31 mL/min — AB (ref >60)
Glucose, Bld: 96 mg/dL (ref 65–99)
POTASSIUM: 3.7 mmol/L (ref 3.5–5.1)
SODIUM: 138 mmol/L (ref 135–145)

## 2015-10-18 LAB — CBC
HEMATOCRIT: 31.2 % — AB (ref 36.0–46.0)
HEMOGLOBIN: 10.4 g/dL — AB (ref 12.0–15.0)
MCH: 27.6 pg (ref 26.0–34.0)
MCHC: 33.3 g/dL (ref 30.0–36.0)
MCV: 82.8 fL (ref 78.0–100.0)
PLATELETS: 312 10*3/uL (ref 150–400)
RBC: 3.77 MIL/uL — AB (ref 3.87–5.11)
RDW: 15 % (ref 11.5–15.5)
WBC: 10.1 10*3/uL (ref 4.0–10.5)

## 2015-10-18 LAB — I-STAT TROPONIN, ED
TROPONIN I, POC: 0.02 ng/mL (ref 0.00–0.08)
Troponin i, poc: 0 ng/mL (ref 0.00–0.08)

## 2015-10-18 LAB — CBG MONITORING, ED: GLUCOSE-CAPILLARY: 67 mg/dL (ref 65–99)

## 2015-10-18 MED ORDER — SODIUM CHLORIDE 0.9 % IV SOLN
INTRAVENOUS | Status: DC
  Administered 2015-10-19: 02:00:00 via INTRAVENOUS

## 2015-10-18 MED ORDER — CYCLOBENZAPRINE HCL 10 MG PO TABS: 10.0000 mg | ORAL_TABLET | Freq: Three times a day (TID) | ORAL | Status: DC | PRN

## 2015-10-18 MED ORDER — SODIUM CHLORIDE 0.9% FLUSH: 3.0000 mL | INTRAVENOUS | Status: DC | PRN

## 2015-10-18 MED ORDER — INSULIN ASPART 100 UNIT/ML ~~LOC~~ SOLN: 0.0000 [IU] | Freq: Every day | SUBCUTANEOUS | Status: DC

## 2015-10-18 MED ORDER — ATORVASTATIN CALCIUM 40 MG PO TABS
40.0000 mg | ORAL_TABLET | Freq: Every day | ORAL | Status: DC
  Administered 2015-10-19 – 2015-10-20 (×2): 40 mg via ORAL
  Filled 2015-10-18 (×3): qty 1

## 2015-10-18 MED ORDER — LEVOTHYROXINE SODIUM 25 MCG PO TABS
137.0000 ug | ORAL_TABLET | Freq: Every day | ORAL | Status: DC
  Administered 2015-10-19 – 2015-10-21 (×3): 137 ug via ORAL
  Filled 2015-10-18 (×3): qty 1

## 2015-10-18 MED ORDER — ASPIRIN EC 81 MG PO TBEC
81.0000 mg | DELAYED_RELEASE_TABLET | Freq: Every day | ORAL | Status: DC
  Administered 2015-10-19 – 2015-10-21 (×3): 81 mg via ORAL
  Filled 2015-10-18 (×3): qty 1

## 2015-10-18 MED ORDER — INSULIN ASPART 100 UNIT/ML ~~LOC~~ SOLN
0.0000 [IU] | Freq: Three times a day (TID) | SUBCUTANEOUS | Status: DC
  Administered 2015-10-19 (×2): 2 [IU] via SUBCUTANEOUS
  Administered 2015-10-20: 1 [IU] via SUBCUTANEOUS
  Administered 2015-10-20: 2 [IU] via SUBCUTANEOUS
  Administered 2015-10-21: 1 [IU] via SUBCUTANEOUS

## 2015-10-18 MED ORDER — SODIUM CHLORIDE 0.9% FLUSH
3.0000 mL | Freq: Two times a day (BID) | INTRAVENOUS | Status: DC
  Administered 2015-10-19 – 2015-10-21 (×5): 3 mL via INTRAVENOUS

## 2015-10-18 MED ORDER — ONDANSETRON HCL 4 MG/2ML IJ SOLN: 4.0000 mg | Freq: Four times a day (QID) | INTRAMUSCULAR | Status: DC | PRN

## 2015-10-18 MED ORDER — FOLIC ACID 1 MG PO TABS
1.0000 mg | ORAL_TABLET | Freq: Every day | ORAL | Status: DC
  Administered 2015-10-19 – 2015-10-21 (×3): 1 mg via ORAL
  Filled 2015-10-18 (×3): qty 1

## 2015-10-18 MED ORDER — HYDROCODONE-ACETAMINOPHEN 5-325 MG PO TABS
1.0000 | ORAL_TABLET | ORAL | Status: DC | PRN
  Administered 2015-10-19 – 2015-10-21 (×6): 2 via ORAL
  Filled 2015-10-18 (×6): qty 2
  Filled 2015-10-18: qty 1
  Filled 2015-10-18: qty 2

## 2015-10-18 MED ORDER — VITAMIN D 1000 UNITS PO TABS
1000.0000 [IU] | ORAL_TABLET | Freq: Every day | ORAL | Status: DC
  Administered 2015-10-19 – 2015-10-21 (×3): 1000 [IU] via ORAL
  Filled 2015-10-18 (×3): qty 1

## 2015-10-18 MED ORDER — HEPARIN SODIUM (PORCINE) 5000 UNIT/ML IJ SOLN
5000.0000 [IU] | Freq: Three times a day (TID) | INTRAMUSCULAR | Status: DC
  Administered 2015-10-19 – 2015-10-21 (×9): 5000 [IU] via SUBCUTANEOUS
  Filled 2015-10-18 (×9): qty 1

## 2015-10-18 MED ORDER — PREGABALIN 75 MG PO CAPS
75.0000 mg | ORAL_CAPSULE | Freq: Two times a day (BID) | ORAL | Status: DC
  Administered 2015-10-19 – 2015-10-21 (×6): 75 mg via ORAL
  Filled 2015-10-18 (×7): qty 1

## 2015-10-18 MED ORDER — FERROUS SULFATE 325 (65 FE) MG PO TABS
325.0000 mg | ORAL_TABLET | Freq: Every day | ORAL | Status: DC
  Administered 2015-10-19: 325 mg via ORAL
  Filled 2015-10-18: qty 1

## 2015-10-18 MED ORDER — ACETAMINOPHEN 325 MG PO TABS: 650.0000 mg | ORAL_TABLET | ORAL | Status: DC | PRN

## 2015-10-18 MED ORDER — SODIUM CHLORIDE 0.9 % IV SOLN: 250.0000 mL | INTRAVENOUS | Status: DC | PRN

## 2015-10-18 MED ORDER — ADULT MULTIVITAMIN W/MINERALS CH
1.0000 | ORAL_TABLET | Freq: Every day | ORAL | Status: DC
  Administered 2015-10-19 – 2015-10-21 (×3): 1 via ORAL
  Filled 2015-10-18 (×3): qty 1

## 2015-10-18 MED ORDER — INSULIN GLARGINE 100 UNIT/ML ~~LOC~~ SOLN
5.0000 [IU] | Freq: Every day | SUBCUTANEOUS | Status: DC
  Administered 2015-10-19 – 2015-10-20 (×3): 5 [IU] via SUBCUTANEOUS
  Filled 2015-10-18 (×4): qty 0.05

## 2015-10-18 MED ORDER — NITROGLYCERIN 0.4 MG SL SUBL: 0.4000 mg | SUBLINGUAL_TABLET | SUBLINGUAL | Status: DC | PRN

## 2015-10-18 MED ORDER — ACETAMINOPHEN 500 MG PO TABS: 1000.0000 mg | ORAL_TABLET | Freq: Four times a day (QID) | ORAL | Status: DC | PRN

## 2015-10-18 MED ORDER — METOPROLOL TARTRATE 25 MG PO TABS
25.0000 mg | ORAL_TABLET | Freq: Every day | ORAL | Status: DC
  Administered 2015-10-19 – 2015-10-21 (×3): 25 mg via ORAL
  Filled 2015-10-18 (×3): qty 1

## 2015-10-18 NOTE — ED Notes (Signed)
Patient transported to X-ray 

## 2015-10-18 NOTE — ED Notes (Signed)
Blood draw enough for istat troponin,  Will notify nurse.

## 2015-10-18 NOTE — ED Notes (Signed)
Patient transported to CT 

## 2015-10-18 NOTE — ED Notes (Signed)
Report to [facility]

## 2015-10-18 NOTE — ED Notes (Signed)
Call back to [facility]

## 2015-10-18 NOTE — ED Notes (Addendum)
Await call back from [facility]

## 2015-10-18 NOTE — ED Triage Notes (Signed)
Pt arrives via [facility] reporting new onset CP radiating to LUE, neck and back, states pain is sharp, stabbing 5/10.  Pt rteports SOB, dizizness, diaphoresis with onset.  EMS reports giving 1 NTG, with relief. BP dropped from 152/58 to 107/62.

## 2015-10-18 NOTE — ED Notes (Signed)
Admitting MD at bedside.

## 2015-10-18 NOTE — ED Provider Notes (Signed)
[facility] DEPT Provider Note   CSN: [id number] Arrival date & time: 10/18/15  1413      History   Chief Complaint Chief Complaint  Patient presents with  . Chest Pain    HPI [name] is a 75 y.o. female.  The history is provided by the patient (joined in ED by husband).  Chest Pain   This is a new problem. The current episode started less than 1 hour ago. The problem occurs constantly. The problem has been resolved. The pain is associated with movement (was getting out of car in parking lot at [place] office). The pain is present in the lateral region (left). The pain is moderate. The quality of the pain is described as sharp. The pain radiates to the left arm, left shoulder and right shoulder. Duration of episode(s) is 20 minutes. Associated symptoms include diaphoresis and shortness of breath. Pertinent negatives include no abdominal pain, no back pain, no cough, no dizziness, no fever, no headaches, no leg pain, no lower extremity edema, no nausea and no near-syncope. She has tried rest and nitroglycerin (and aspirin) for the symptoms. The treatment provided significant (resolved) relief. Risk factors include obesity, being elderly and post-menopausal.    Past Medical History:  Diagnosis Date  . Allergic rhinitis   . Anemia   . Aortic stenosis 12/22/2012   Mild 3/14-2.35 m/s peak velocity  . Chronic pain   . CKD (chronic kidney disease), stage III   . Diabetes mellitus   . Diverticulosis   . DVT (deep venous thrombosis) ([place])   . Glaucoma   . Hemorrhoids    internal  . HTN (hypertension)   . Hypercholesteremia   . Hyperlipidemia 12/22/2012  . Hypothyroidism   . Meningioma ([place]) 2008  . Osteoarthritis   . Peripheral neuropathy ([place])   . PPD positive    6 months ago  . PVC (premature ventricular contraction) 12/22/2012  . Rheumatoid arthritis(714.0)   . Sickle cell trait ([place])   . 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 Diabetes mellitus, type II, insulin dependent ([place]) 10/18/2015  . Rheumatoid arthritis ([place]) 10/18/2015  . AKI (acute kidney injury) ([place]) 10/18/2015  . CKD (chronic kidney disease), stage II 10/18/2015  . Lung nodule seen on imaging study 10/18/2015  . Hypoglycemia 08/22/2014  . Neuropathy ([place]) 08/22/2014  . Hypothyroidism 08/22/2014  . Chronic pain 08/22/2014  . 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 Chest pain 08/05/2013  . Aortic stenosis 12/22/2012  . Hyperlipidemia 12/22/2012  . PVC (premature ventricular contraction) 12/22/2012  . HTN (hypertension) 12/22/2012  . Open angle with borderline findings, high risk 12/15/2011  . Cataract, nuclear 05/25/2011    Past Surgical History:  Procedure Laterality Date  . ABDOMINAL HYSTERECTOMY  1978   TAH,& BSO 1  . fistula repair  03/2010   partial colectomy  . intracranial surgery  11/2007   removal of meningioma  . JOINT REPLACEMENT  2001   left TKR  . LEFT HEART CATHETERIZATION WITH CORONARY ANGIOGRAM N/A 08/08/2013   Procedure: LEFT HEART CATHETERIZATION WITH CORONARY ANGIOGRAM;  Surgeon: Peter M Martinique, MD;  Location: [facility] CATH LAB;  Service: Cardiovascular;  Laterality: N/A;  . ROTATOR CUFF REPAIR  1999   BIlateral  . SIGMOIDECTOMY  2012  . TONSILLECTOMY AND ADENOIDECTOMY    . TOTAL  THYROIDECTOMY  1992  . wrist sx     right     OB History    No data available       Home Medications    Prior to Admission medications   Medication Sig Start Date End Date Taking? Authorizing Provider  abatacept (ORENCIA) 250 MG injection Inject 750 mg into the vein. Infuse 750 mg / 3 vials over 30 minutes every 4 weeks   Yes Historical Provider, MD  acetaminophen (TYLENOL) 500 MG tablet Take 1,000 mg by mouth every 6 (six) hours as needed for mild pain.   Yes Historical Provider, MD    atorvastatin (LIPITOR) 40 MG tablet Take 40 mg by mouth daily at 6 PM.    Yes Historical Provider, MD  cholecalciferol (VITAMIN D) 1000 UNITS tablet Take 1,000 Units by mouth daily.    Yes Historical Provider, MD  cyclobenzaprine (FLEXERIL) 10 MG tablet Take 10 mg by mouth 3 (three) times daily as needed for muscle spasms.    Yes Historical Provider, MD  diphenhydrAMINE (BENADRYL) 25 MG tablet Take 50 mg by mouth See admin instructions. One hour prior to [place] infusion   Yes Historical Provider, MD  ferrous sulfate (FEOSOL) 325 (65 FE) MG tablet Take 325-650 mg by mouth daily with breakfast.   Yes Historical Provider, MD  folic acid (FOLVITE) 1 MG tablet Take 1 mg by mouth daily.   Yes Historical Provider, MD  Furosemide (LASIX PO) Take 1 tablet by mouth daily as needed (fluid/edema).   Yes Historical Provider, MD  HYDROcodone-acetaminophen (NORCO/VICODIN) 5-325 MG per tablet Take 1-2 tablets by mouth every 3 (three) hours as needed for moderate pain. 08/26/14  Yes Theodis Blaze, MD  insulin NPH-regular Human (NOVOLIN 70/30) (70-30) 100 UNIT/ML injection Inject 6-8 Units into the skin 3 (three) times daily with meals. Sliding source   Yes Historical Provider, MD  levothyroxine (SYNTHROID, LEVOTHROID) 137 MCG tablet Take 137 mcg by mouth daily before breakfast. 10/12/15  Yes Historical Provider, MD  losartan (COZAAR) 50 MG tablet Take 50 mg by mouth every evening.   Yes Historical Provider, MD  metoprolol tartrate (LOPRESSOR) 25 MG tablet Take 1 tablet (25 mg total) by mouth 2 (two) times daily as needed. Patient taking differently: Take 25 mg by mouth daily.  05/07/15  Yes Jerline Pain, MD  multivitamin [facility]) per tablet Take 1 tablet by mouth daily.    Yes Historical Provider, MD  pregabalin (LYRICA) 75 MG capsule Take 75 mg by mouth 2 (two) times daily.    Yes Historical Provider, MD    Family History Family History  Problem Relation Age of Onset  . Hypotension Mother   . CVA Mother   .  Diabetes Mother   . Hypertension Father   . Kidney failure Father     Social History Social History  Substance Use Topics  . Smoking status: Former Smoker    Quit date: 01/06/1962  . Smokeless tobacco: Never Used  . Alcohol use Yes     Comment: occasionally     Allergies   Methotrexate derivatives; Other; Penicillins; Enbrel [etanercept]; Remicade [infliximab]; and Zocor [simvastatin]   Review of Systems Review of Systems  Constitutional: Positive for diaphoresis. Negative for fever.  HENT: Negative for congestion.   Respiratory: Positive for shortness of breath. Negative for cough.   Cardiovascular: Positive for chest pain. Negative for near-syncope.  Gastrointestinal: Negative for abdominal pain and nausea.  Genitourinary: Negative for flank pain.  Musculoskeletal: Negative for back pain.  Skin: Negative for rash.  Neurological: Negative for dizziness and headaches.  Psychiatric/Behavioral: Negative for confusion.     Physical Exam Updated Vital Signs BP (!) 140/53   Pulse (!) 53   Temp 98.1 F (36.7 C) (Oral)   Resp 14   Ht _0  (1.626 m)   Wt 89.8 kg   SpO2 98%   BMI 33.99 kg/m   Physical Exam  Constitutional: She appears well-developed and well-nourished. No distress.  Pleasant, cooperative, overweight otherwise well-appearing  HENT:  Head: Normocephalic and atraumatic.  Eyes: Conjunctivae are normal. No scleral icterus.  Neck: Normal range of motion. Neck supple. No JVD present.  Cardiovascular: Normal rate, regular rhythm and intact distal pulses.   Murmur heard. Pulmonary/Chest: Effort normal and breath sounds normal. No respiratory distress.  Abdominal: Soft. She exhibits no distension. There is no tenderness.  Musculoskeletal:  Symmetric size and appearance of b/l Le's. Trace nonpitting edema b/l  Neurological: She is alert. She exhibits normal muscle tone. Coordination normal.  Skin: Skin is warm and dry. No rash noted. She is not diaphoretic.    Psychiatric: She has a normal mood and affect.  Nursing note and vitals reviewed.    ED Treatments / Results  Labs (all labs ordered are listed, but only abnormal results are displayed) Labs Reviewed  BASIC METABOLIC PANEL - Abnormal; Notable for the following:       Result Value   BUN 25 (*)    Creatinine, Ser 1.78 (*)    GFR calc non Af Amer 27 (*)    GFR calc Af Amer 31 (*)    All other components within normal limits  CBC - Abnormal; Notable for the following:    RBC 3.77 (*)    Hemoglobin 10.4 (*)    HCT 31.2 (*)    All other components within normal limits  TROPONIN I  TROPONIN I  TROPONIN I  BRAIN NATRIURETIC PEPTIDE  BASIC METABOLIC PANEL  HEMOGLOBIN A1C  I-STAT TROPOININ, ED  CBG MONITORING, ED  I-STAT TROPOININ, ED    EKG  EKG Interpretation  Date/Time:  Thursday October 18 2015 14:17:34 EDT Ventricular Rate:  54 PR Interval:  158 QRS Duration: 84 QT Interval:  464 QTC Calculation: 440 R Axis:   46 Text Interpretation:  Sinus bradycardia Otherwise normal ECG Otherwise within normal limits Confirmed by Carmin Muskrat  MD [phone]) on 10/18/2015 3:17:12 PM       Radiology Dg Chest 2 View  Result Date: 10/18/2015 CLINICAL DATA:  Chest pain for 1 day EXAM: CHEST  2 VIEW COMPARISON:  09/06/2015 FINDINGS: Hazy ill-defined opacities in the left mid and lower lung zones. Right lung is clear. Normal heart size. No pneumothorax or pleural effusion. IMPRESSION: Patchy left lung airspace opacity. Followup PA and lateral chest X-ray is recommended in 3-4 weeks following trial of antibiotic therapy to ensure resolution and exclude underlying malignancy. Electronically Signed   By: Marybelle Killings M.D.   On: 10/18/2015 15:02   Ct Chest Wo Contrast  Result Date: 10/18/2015 CLINICAL DATA:  75 year old female with shortness of Breath since this morning. Abnormal left lung opacity on radiographs today. Initial encounter. EXAM: CT CHEST WITHOUT CONTRAST TECHNIQUE:  Multidetector CT imaging of the chest was performed following the standard protocol without IV contrast. COMPARISON:  Chest radiographs 1459 hours today. Chest CT 02/14/2015. FINDINGS: Cardiovascular: No pericardial effusion. Calcified coronary artery atherosclerosis. Mild calcified aortic atherosclerosis. Mediastinum/Nodes: No lymphadenopathy. Lungs/Pleura: Curvilinear opacity about the left hilum involving the lingula and  lateral basal segment of the lower lobe appears to correspond to the radiographic finding today and most resembles progressive scarring or atelectasis. Left lower lobe bronchiectasis is more apparent than in February (series 205, image 87). Other left perihilar and upper lobe bronchiectasis also noted. There is a tiny left anterior upper lobe subpleural nodule on series 205, image 55 which is new and appears inflammatory. Major airways are patent. Chronic right lower lobe bronchiectasis and volume loss in the medial basal segment appears stable. Mild scarring or atelectasis in the right costophrenic angle and superior segment right lower lobe. Previously seen subtle 2-3 mm right upper lobe nodule has resolved and was inflammatory. Mild right upper lobe bronchiectasis. No pleural effusion. Upper Abdomen: Stable and negative visualized noncontrast liver, gallbladder, spleen, pancreas, adrenal glands, and kidneys. Negative visualized bowel. Musculoskeletal: Heterogeneous bone mineralization throughout the spine appears stable. No acute osseous abnormality identified. IMPRESSION: 1. Bronchiectasis. Mild increased scarring or atelectasis in the left lung responsible for the patchy opacity on radiographs today. 2. There is a tiny left upper lobe subpleural nodule which is new since February and is likely inflammatory. The 2-3 mm right lung nodule seen in February has resolved. No other acute lung finding. No follow-up needed if patient is low-risk. Non-contrast chest CT can be considered in 12 months  if patient is high-risk. This recommendation follows the consensus statement: Guidelines for Management of Incidental Pulmonary Nodules Detected on CT Images: From the Fleischner Society 2017; Radiology 2017; 284:228-243. 3. Calcified coronary artery and to a lesser extent aortic atherosclerosis. Electronically Signed   By: Genevie Ann M.D.   On: 10/18/2015 17:39    Procedures Procedures (including critical care time)  Medications Ordered in ED Medications  ferrous sulfate tablet 325 mg (not administered)  levothyroxine (SYNTHROID, LEVOTHROID) tablet 137 mcg (not administered)  metoprolol tartrate (LOPRESSOR) tablet 25 mg (not administered)  HYDROcodone-acetaminophen (NORCO/VICODIN) 5-325 MG per tablet 1-2 tablet (not administered)  atorvastatin (LIPITOR) tablet 40 mg (not administered)  cholecalciferol (VITAMIN D) tablet 1,000 Units (not administered)  cyclobenzaprine (FLEXERIL) tablet 10 mg (not administered)  folic acid (FOLVITE) tablet 1 mg (not administered)  multivitamin (THERAGRAN) per tablet 1 tablet (not administered)  pregabalin (LYRICA) capsule 75 mg (not administered)  aspirin EC tablet 81 mg (not administered)  nitroGLYCERIN (NITROSTAT) SL tablet 0.4 mg (not administered)  acetaminophen (TYLENOL) tablet 650 mg (not administered)  ondansetron (ZOFRAN) injection 4 mg (not administered)  heparin injection 5,000 Units (not administered)  sodium chloride flush (NS) 0.9 % injection 3 mL (not administered)  sodium chloride flush (NS) 0.9 % injection 3 mL (not administered)  0.9 %  sodium chloride infusion (not administered)  insulin glargine (LANTUS) injection 5 Units (not administered)  insulin aspart (novoLOG) injection 0-9 Units (not administered)  insulin aspart (novoLOG) injection 0-5 Units (not administered)  0.9 %  sodium chloride infusion (not administered)     Initial Impression / Assessment and Plan / ED Course  I have reviewed the triage vital signs and the nursing  notes.  Pertinent labs & imaging results that were available during my care of the patient were reviewed by me and considered in my medical decision making (see chart for details).  Clinical Course   SAMIYA MERVIN is a 75 y.o. female with a myriad of medical problems, including CKD stage 3, Dm, HTN, HLD, Ra, who presents to ED for evaluation of rapid-onset sharp substernal and left-sided chest pain radiating to b/l shoulders and down LUE while getting out of  her car today in the parking lot for a doctor's appointment. The CP was followed by dyspnea and diaphoresis, all resolved after 324 mg ASA and NTG given by EMS en route. Pt denies recurrence of CP since then. Concern for ACS. Doubt dissection. CXR with pulmonary nodule, further evaluated with CT chest. Doubt pneumonia, no cough or fever. Initial troponin negative. Shared decision making with pt and husband, who prefer to stay for observation and stress test tomorrow.   Pt condition, course, and admission were discussed with attending physician Dr. Carmin Muskrat.  Final Clinical Impressions(s) / ED Diagnoses   Final diagnoses:  Chest pain, unspecified type    New Prescriptions Current Discharge Medication List       Paralee Cancel, MD 10/18/15 [place], MD 10/24/15 2208

## 2015-10-18 NOTE — ED Notes (Signed)
Attempt placement of peripheral IV and blood draw in [facility], unsuccessful.  Only able to draw 1cc blood, used this to check CBG which was 67.  EDP notified and IV team consult placed.

## 2015-10-18 NOTE — H&P (Signed)
History and Physical    [name] MWN:[id number] DOB: 1940/03/11 DOA: 10/18/2015  PCP: Lilian Coma, MD   Patient coming from: Home  Chief Complaint: Chest pain   HPI: [name] is a 75 y.o. female with medical history significant for chronic kidney disease stage III, insulin-dependent diabetes mellitus, hypertension, hypothyroidism, rheumatoid arthritis, and chronic diastolic CHF who presents to the emergency department with complaints of chest pain. Patient reports that she awoke in her usual state health, but went on to develop a sharp stabbing, moderate in intensity chest pain, with radiation to the left arm, neck, and back. Pain developed while at rest and was associated with dyspnea, lightheadedness, and diaphoresis. Patient reports having similar symptoms once before and was hospitalized with negative cardiac workup. Patient activated EMS for transport to the hospital. She denies any fevers or chills and denies cough or dyspnea. She denies any sick contacts or long distance travel. There has been no lower extremity swelling or tenderness and no palpitations or hemoptysis. Patient received 324 mg aspirin and one sublingual nitroglycerin en route to the hospital and her pain resolved by the time she arrived.  ED Course: Upon arrival to the ED, patient is found to be afebrile, saturating well on room air, bradycardic in the mid 50s, and with vitals otherwise stable. EKG demonstrates sinus bradycardia with rate 54 and is otherwise normal. Chest x-ray features a patchy left lung airspace opacity and this was followed up with a CT of the chest which identifies the opacity has bronchiectasis with mild scarring or atelectasis. Also noted on the CT is a tiny left upper lobe subpleural nodule, but no acute cardiopulmonary disease. Chemistry panel was notable for BUN of 25 and creatinine of 1.78, up from her apparent baseline of 1.2. CBC is notable for a stable normocytic anemia with  hemoglobin of 10.4. Troponin was undetectable. Patient has remained hemodynamically stable in the emergency department with no apparent respiratory distress and no recurrence in her chest pain. She will be observed on the telemetry unit for ongoing evaluation and management of chest pain with risk factors for coronary artery disease and concern for possible ACS.  Review of Systems:  All other systems reviewed and apart from HPI, are negative.  Past Medical History:  Diagnosis Date  . Allergic rhinitis   . Anemia   . Aortic stenosis 12/22/2012   Mild 3/14-2.35 m/s peak velocity  . Chronic pain   . CKD (chronic kidney disease), stage III   . Diabetes mellitus   . Diverticulosis   . DVT (deep venous thrombosis) ([place])   . Glaucoma   . Hemorrhoids    internal  . HTN (hypertension)   . Hypercholesteremia   . Hyperlipidemia 12/22/2012  . Hypothyroidism   . Meningioma ([place]) 2008  . Osteoarthritis   . Peripheral neuropathy ([place])   . PPD positive    6 months ago  . PVC (premature ventricular contraction) 12/22/2012  . Rheumatoid arthritis(714.0)   . Sickle cell trait ([place])   . 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 ABDOMINAL HYSTERECTOMY  1978   TAH,& BSO 1  . fistula repair  03/2010   partial colectomy  . intracranial surgery  11/2007   removal of meningioma  . JOINT REPLACEMENT  2001   left TKR  . LEFT HEART CATHETERIZATION WITH CORONARY ANGIOGRAM N/A 08/08/2013  Procedure: LEFT HEART CATHETERIZATION WITH CORONARY ANGIOGRAM;  Surgeon: Peter M Martinique, MD;  Location[facility];  Service: Cardiovascular;  Laterality: N/A;  . ROTATOR CUFF REPAIR  1999   BIlateral  . SIGMOIDECTOMY  2012  . TONSILLECTOMY AND ADENOIDECTOMY    . TOTAL THYROIDECTOMY  1992  . wrist sx     right      reports that she quit smoking about 53 years ago. She has never used smokeless tobacco.  She reports that she drinks alcohol. She reports that she does not use drugs.  Allergies  Allergen Reactions  . Methotrexate Derivatives Other (See Comments)    Elevated creat. Levels  . Other Other (See Comments)    NO Blood products.  [name]- causes low blood sugar (pt states she was taking medication but cant remember what it was for.)  . Penicillins Rash    Has patient had a PCN reaction causing immediate rash, facial/tongue/throat swelling, SOB or lightheadedness with hypotension: Yes Has patient had a PCN reaction causing severe rash involving mucus membranes or skin necrosis: Yes  Has patient had a PCN reaction that required hospitalization No Has patient had a PCN reaction occurring within the last 10 years: No If all of the above answers are "NO", then may proceed with Cephalosporin use.   . Enbrel [Etanercept] Other (See Comments)    Inadequate response    . Remicade [Infliximab] Other (See Comments)    Inadequate response   . Zocor [Simvastatin] Other (See Comments)    Leg cramps     Family History  Problem Relation Age of Onset  . Hypotension Mother   . CVA Mother   . Diabetes Mother   . Hypertension Father   . Kidney failure Father      Prior to Admission medications   Medication Sig Start Date End Date Taking? Authorizing Provider  abatacept (ORENCIA) 250 MG injection Inject 750 mg into the vein. Infuse 750 mg / 3 vials over 30 minutes every 4 weeks   Yes Historical Provider, MD  acetaminophen (TYLENOL) 500 MG tablet Take 1,000 mg by mouth every 6 (six) hours as needed for mild pain.   Yes Historical Provider, MD  atorvastatin (LIPITOR) 40 MG tablet Take 40 mg by mouth daily at 6 PM.    Yes Historical Provider, MD  cholecalciferol (VITAMIN D) 1000 UNITS tablet Take 1,000 Units by mouth daily.    Yes Historical Provider, MD  cyclobenzaprine (FLEXERIL) 10 MG tablet Take 10 mg by mouth 3 (three) times daily as needed for muscle spasms.    Yes Historical  Provider, MD  diphenhydrAMINE (BENADRYL) 25 MG tablet Take 50 mg by mouth See admin instructions. One hour prior to [place] infusion   Yes Historical Provider, MD  ferrous sulfate (FEOSOL) 325 (65 FE) MG tablet Take 325-650 mg by mouth daily with breakfast.   Yes Historical Provider, MD  folic acid (FOLVITE) 1 MG tablet Take 1 mg by mouth daily.   Yes Historical Provider, MD  Furosemide (LASIX PO) Take 1 tablet by mouth daily as needed (fluid/edema).   Yes Historical Provider, MD  HYDROcodone-acetaminophen (NORCO/VICODIN) 5-325 MG per tablet Take 1-2 tablets by mouth every 3 (three) hours as needed for moderate pain. 08/26/14  Yes Theodis Blaze, MD  insulin NPH-regular Human (NOVOLIN 70/30) (70-30) 100 UNIT/ML injection Inject 6-8 Units into the skin 3 (three) times daily with meals. Sliding source   Yes Historical Provider, MD  levothyroxine (SYNTHROID, LEVOTHROID) 137 MCG tablet Take 137  mcg by mouth daily before breakfast. 10/12/15  Yes Historical Provider, MD  losartan (COZAAR) 50 MG tablet Take 50 mg by mouth every evening.   Yes Historical Provider, MD  metoprolol tartrate (LOPRESSOR) 25 MG tablet Take 1 tablet (25 mg total) by mouth 2 (two) times daily as needed. Patient taking differently: Take 25 mg by mouth daily.  05/07/15  Yes Jerline Pain, MD  multivitamin [facility]) per tablet Take 1 tablet by mouth daily.    Yes Historical Provider, MD  pregabalin (LYRICA) 75 MG capsule Take 75 mg by mouth 2 (two) times daily.    Yes Historical Provider, MD    Physical Exam: Vitals:   10/18/15 1533 10/18/15 1900 10/18/15 2138 10/18/15 2200  BP:  141/60 141/57 (!) 140/53  Pulse: (!) 51 (!) 53 (!) 56 (!) 53  Resp: 12 13 19 14   Temp:      TempSrc:      SpO2: 100% 100% 99% 98%  Weight:      Height:          Constitutional: NAD, calm, comfortable Eyes: PERTLA, lids and conjunctivae normal ENMT: Mucous membranes are moist. Posterior pharynx clear of any exudate or lesions.   Neck: normal,  supple, no masses, no thyromegaly Respiratory: clear to auscultation bilaterally, no wheezing, no crackles. Normal respiratory effort.   Cardiovascular: S1 & S2 heard, regular rate and rhythm, grade 3 midsystolic murmur at LSB. No carotid bruits. No significant JVD. Abdomen: No distension, no tenderness, no masses palpated. Bowel sounds normal.  Musculoskeletal: no clubbing / cyanosis. No joint deformity upper and lower extremities. Normal muscle tone.  Skin: no significant rashes, lesions, ulcers. Warm, dry, well-perfused. Neurologic: CN 2-12 grossly intact. Sensation intact, DTR normal. Strength 5/5 in all 4 limbs.  Psychiatric: Normal judgment and insight. Alert and oriented x 3. Normal mood and affect.     Labs on Admission: I have personally reviewed following labs and imaging studies  CBC:  Recent Labs Lab 10/18/15 1710  WBC 10.1  HGB 10.4*  HCT 31.2*  MCV 82.8  PLT 412   Basic Metabolic Panel:  Recent Labs Lab 10/18/15 1710  NA 138  K 3.7  CL 107  CO2 24  GLUCOSE 96  BUN 25*  CREATININE 1.78*  CALCIUM 9.2   GFR: Estimated Creatinine Clearance: 29.2 mL/min (by C-G formula based on SCr of 1.78 mg/dL (H)). Liver Function Tests: No results for input(s): AST, ALT, ALKPHOS, BILITOT, PROT, ALBUMIN in the last 168 hours. No results for input(s): LIPASE, AMYLASE in the last 168 hours. No results for input(s): AMMONIA in the last 168 hours. Coagulation Profile: No results for input(s): INR, PROTIME in the last 168 hours. Cardiac Enzymes: No results for input(s): CKTOTAL, CKMB, CKMBINDEX, TROPONINI in the last 168 hours. BNP (last 3 results) No results for input(s): PROBNP in the last 8760 hours. HbA1C: No results for input(s): HGBA1C in the last 72 hours. CBG:  Recent Labs Lab 10/18/15 1533  GLUCAP 67   Lipid Profile: No results for input(s): CHOL, HDL, LDLCALC, TRIG, CHOLHDL, LDLDIRECT in the last 72 hours. Thyroid Function Tests: No results for input(s):  TSH, T4TOTAL, FREET4, T3FREE, THYROIDAB in the last 72 hours. Anemia Panel: No results for input(s): VITAMINB12, FOLATE, FERRITIN, TIBC, IRON, RETICCTPCT in the last 72 hours. Urine analysis:    Component Value Date/Time   COLORURINE YELLOW 09/12/2015 1025   APPEARANCEUR CLOUDY (A) 09/12/2015 1025   LABSPEC 1.023 09/12/2015 1025   LABSPEC 1.030 01/10/2010 1424  PHURINE 5.0 09/12/2015 1025   GLUCOSEU NEGATIVE 09/12/2015 1025   HGBUR NEGATIVE 09/12/2015 1025   [name] 09/12/2015 1025   BILIRUBINUR not done 01/10/2010 [place] 09/12/2015 1025   PROTEINUR NEGATIVE 09/12/2015 1025   UROBILINOGEN 0.2 09/20/2014 1643   NITRITE NEGATIVE 09/12/2015 1025   LEUKOCYTESUR SMALL (A) 09/12/2015 1025   LEUKOCYTESUR Large 01/10/2010 1424   Sepsis Labs: @LABRCNTIP (procalcitonin:4,lacticidven:4) )No results found for this or any previous visit (from the past 240 hour(s)).   Radiological Exams on Admission: Dg Chest 2 View  Result Date: 10/18/2015 CLINICAL DATA:  Chest pain for 1 day EXAM: CHEST  2 VIEW COMPARISON:  09/06/2015 FINDINGS: Hazy ill-defined opacities in the left mid and lower lung zones. Right lung is clear. Normal heart size. No pneumothorax or pleural effusion. IMPRESSION: Patchy left lung airspace opacity. Followup PA and lateral chest X-ray is recommended in 3-4 weeks following trial of antibiotic therapy to ensure resolution and exclude underlying malignancy. Electronically Signed   By: Marybelle Killings M.D.   On: 10/18/2015 15:02   Ct Chest Wo Contrast  Result Date: 10/18/2015 CLINICAL DATA:  75 year old female with shortness of Breath since this morning. Abnormal left lung opacity on radiographs today. Initial encounter. EXAM: CT CHEST WITHOUT CONTRAST TECHNIQUE: Multidetector CT imaging of the chest was performed following the standard protocol without IV contrast. COMPARISON:  Chest radiographs 1459 hours today. Chest CT 02/14/2015. FINDINGS:  Cardiovascular: No pericardial effusion. Calcified coronary artery atherosclerosis. Mild calcified aortic atherosclerosis. Mediastinum/Nodes: No lymphadenopathy. Lungs/Pleura: Curvilinear opacity about the left hilum involving the lingula and lateral basal segment of the lower lobe appears to correspond to the radiographic finding today and most resembles progressive scarring or atelectasis. Left lower lobe bronchiectasis is more apparent than in February (series 205, image 87). Other left perihilar and upper lobe bronchiectasis also noted. There is a tiny left anterior upper lobe subpleural nodule on series 205, image 55 which is new and appears inflammatory. Major airways are patent. Chronic right lower lobe bronchiectasis and volume loss in the medial basal segment appears stable. Mild scarring or atelectasis in the right costophrenic angle and superior segment right lower lobe. Previously seen subtle 2-3 mm right upper lobe nodule has resolved and was inflammatory. Mild right upper lobe bronchiectasis. No pleural effusion. Upper Abdomen: Stable and negative visualized noncontrast liver, gallbladder, spleen, pancreas, adrenal glands, and kidneys. Negative visualized bowel. Musculoskeletal: Heterogeneous bone mineralization throughout the spine appears stable. No acute osseous abnormality identified. IMPRESSION: 1. Bronchiectasis. Mild increased scarring or atelectasis in the left lung responsible for the patchy opacity on radiographs today. 2. There is a tiny left upper lobe subpleural nodule which is new since February and is likely inflammatory. The 2-3 mm right lung nodule seen in February has resolved. No other acute lung finding. No follow-up needed if patient is low-risk. Non-contrast chest CT can be considered in 12 months if patient is high-risk. This recommendation follows the consensus statement: Guidelines for Management of Incidental Pulmonary Nodules Detected on CT Images: From the Fleischner Society  2017; Radiology 2017; 284:228-243. 3. Calcified coronary artery and to a lesser extent aortic atherosclerosis. Electronically Signed   By: Genevie Ann M.D.   On: 10/18/2015 17:39    EKG: Independently reviewed. Sinus bradycardia (rate 54), otherwise normal EKG  Assessment/Plan  1. Chest pain  - Treated with ASA 324 mg and NTG x1 en route with EMS  - Pain resolved prior to admission  - Initial EKG with sinus bradycardia (rate  66), but otherwise normal; first troponin is 0.00; no acute cardiopulmonary disease on chest imaging   - Monitor on telemetry for ischemic changes, obtain serial troponin measurements, and repeat EKG  - Continue Lipitor, continue Lopressor as HR allows  - Further management will be contingent on overnight events and troponin trend   2. AKI superimposed on CKD stage III  - SCr 1.78 on admission, up from apparent baseline of 1.2  - Pt appears dry on admission, suggesting prerenal azotemia; will hold Lasix and lisinopril and provide a gentle IVF hydration  - Repeat chem panel in am   3. Chronic diastolic CHF  - Appears dry on admission and is given a gentle IVF hydration  - TTE (08/05/13) with EF 65-70%, mild AS, mild MR, and mild LAE  - Hold Lasix and losartan given AKI and apparent dehydration  - Follow daily wts and strict I/O's    4. Insulin-dependent DM  - A1c 6.7% in August 2016  - Managed with Novolin 70/30 at home, 6-8 units TID per sliding-scale; will hold this while here  - Check CBG with meals and qHS  - Start with Lantus 5 units qHS and a low-intensity SSI correctional    5. Hypertension  - At goal currently  - Continue Lopressor as HR allows  - Hold losartan until renal fxn stabilizes    6. Hypothyroidism  - Appears to be stable, continue Synthroid   7. Rheumatoid arthritis with chronic pain  - Stable, followed by rheumatology with abatacept infusions - Continue home-regimen of prn Norco    8. Sinus bradycardia - HR in mid-50's; pt is  asymptomatic on admission  - Likely secondary to Lopressor; ruling-out ACS as above   - Continue Lopressor with holding parameters  9. Incidental lung nodule on CT - Tiny LUL subpleural nodule noted on CT  - Discussed with patient who will follow-up with PCP for follow-up as deemed appropriate   DVT prophylaxis: sq heparin  Code Status: Full  Family Communication: Husband and daughter updated at bedside Disposition Plan: Observe on telemetry Consults called: None Admission status: Observation     Vianne Bulls, MD [facility] Pager [phone]  If 7PM-7AM, please contact night-coverage [email or web address] Password [email or web address]  10/18/2015, 11:03 PM

## 2015-10-19 ENCOUNTER — Ambulatory Visit (HOSPITAL_BASED_OUTPATIENT_CLINIC_OR_DEPARTMENT_OTHER): Payer: Medicare Other

## 2015-10-19 ENCOUNTER — Encounter (HOSPITAL_COMMUNITY): Payer: Self-pay | Admitting: General Practice

## 2015-10-19 DIAGNOSIS — N179 Acute kidney failure, unspecified: Secondary | ICD-10-CM

## 2015-10-19 DIAGNOSIS — I350 Nonrheumatic aortic (valve) stenosis: Secondary | ICD-10-CM | POA: Diagnosis not present

## 2015-10-19 DIAGNOSIS — I5032 Chronic diastolic (congestive) heart failure: Secondary | ICD-10-CM

## 2015-10-19 DIAGNOSIS — I10 Essential (primary) hypertension: Secondary | ICD-10-CM

## 2015-10-19 DIAGNOSIS — R079 Chest pain, unspecified: Secondary | ICD-10-CM

## 2015-10-19 DIAGNOSIS — R072 Precordial pain: Secondary | ICD-10-CM | POA: Diagnosis not present

## 2015-10-19 DIAGNOSIS — G894 Chronic pain syndrome: Secondary | ICD-10-CM

## 2015-10-19 DIAGNOSIS — N183 Chronic kidney disease, stage 3 (moderate): Secondary | ICD-10-CM

## 2015-10-19 DIAGNOSIS — Z794 Long term (current) use of insulin: Secondary | ICD-10-CM

## 2015-10-19 DIAGNOSIS — E119 Type 2 diabetes mellitus without complications: Secondary | ICD-10-CM

## 2015-10-19 DIAGNOSIS — M069 Rheumatoid arthritis, unspecified: Secondary | ICD-10-CM

## 2015-10-19 DIAGNOSIS — R911 Solitary pulmonary nodule: Secondary | ICD-10-CM

## 2015-10-19 DIAGNOSIS — E039 Hypothyroidism, unspecified: Secondary | ICD-10-CM

## 2015-10-19 LAB — GLUCOSE, CAPILLARY
GLUCOSE-CAPILLARY: 112 mg/dL — AB (ref 65–99)
GLUCOSE-CAPILLARY: 129 mg/dL — AB (ref 65–99)
GLUCOSE-CAPILLARY: 131 mg/dL — AB (ref 65–99)
GLUCOSE-CAPILLARY: 165 mg/dL — AB (ref 65–99)
Glucose-Capillary: 156 mg/dL — ABNORMAL HIGH (ref 65–99)

## 2015-10-19 LAB — TROPONIN I
Troponin I: <0.03 ng/mL (ref <0.03)
Troponin I: <0.03 ng/mL (ref <0.03)

## 2015-10-19 LAB — BASIC METABOLIC PANEL
ANION GAP: 9 (ref 5–15)
BUN: 23 mg/dL — AB (ref 6–20)
CO2: 22 mmol/L (ref 22–32)
Calcium: 9 mg/dL (ref 8.9–10.3)
Chloride: 108 mmol/L (ref 101–111)
Creatinine, Ser: 1.5 mg/dL — ABNORMAL HIGH (ref 0.44–1.00)
GFR calc Af Amer: 38 mL/min — ABNORMAL LOW (ref >60)
GFR, EST NON AFRICAN AMERICAN: 33 mL/min — AB (ref >60)
Glucose, Bld: 165 mg/dL — ABNORMAL HIGH (ref 65–99)
POTASSIUM: 3.7 mmol/L (ref 3.5–5.1)
SODIUM: 139 mmol/L (ref 135–145)

## 2015-10-19 LAB — ECHOCARDIOGRAM COMPLETE
Height: 64 in
Weight: 3028.8 oz

## 2015-10-19 LAB — BRAIN NATRIURETIC PEPTIDE: B NATRIURETIC PEPTIDE 5: 46.4 pg/mL (ref 0.0–100.0)

## 2015-10-19 MED ORDER — FERROUS SULFATE 325 (65 FE) MG PO TABS
325.0000 mg | ORAL_TABLET | Freq: Every day | ORAL | Status: DC
  Administered 2015-10-21: 325 mg via ORAL
  Filled 2015-10-19 (×2): qty 1

## 2015-10-19 NOTE — Progress Notes (Signed)
  Echocardiogram 2D Echocardiogram has been performed.  [name] 10/19/2015, 3:10 PM

## 2015-10-19 NOTE — Consult Note (Signed)
Patient ID: [name] MRN: [id number], DOB/AGE: 08-11-1940   Admit date: 10/18/2015  Reason for Consult: Chest Pain  Requesting MD: Dr. Maylene Roes, Internal Medicine   Primary Physician: Lilian Coma, MD Primary Cardiologist: Dr. Marlou Porch  Pt. Profile:  75 y.o.femalewith history of carotid artery disease, with bilateral ICA stenosis of up to 40%, mild aortic insufficiency/stenosis, diastolic dysfunction, stage III CKD,  HTN, insulin dependent type 2 diabetes, RA and hypothryoidism. She had a heart catheterization on 08/08/13 which was normal.EF 70%. She has a prior h/o DVT in 2015 and was treated with Xarelto, but discontinued use. 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   Problem List      Past Medical History:  Diagnosis Date  . Allergic rhinitis   . Anemia   . Aortic stenosis 12/22/2012   Mild 3/14-2.35 m/s peak velocity  . CHF (congestive heart failure) ([place])   . Chronic pain   . CKD (chronic kidney disease), stage III   . Diabetes mellitus   . Diverticulosis   . DVT (deep venous thrombosis) ([place])   . Glaucoma   . Hemorrhoids    internal  . HTN (hypertension)   . Hypercholesteremia   . Hyperlipidemia 12/22/2012  . Hypothyroidism   . Meningioma ([place]) 2008  . Osteoarthritis   . Peripheral neuropathy ([place])   . PPD positive    6 months ago  . PVC (premature ventricular contraction) 12/22/2012  . Rheumatoid arthritis(714.0)   . Sickle cell trait ([place])   . 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 ABDOMINAL HYSTERECTOMY  1978   TAH,& BSO 1  . fistula repair  03/2010   partial colectomy  . intracranial surgery  11/2007   removal of meningioma  . JOINT REPLACEMENT  2001   left TKR  . LEFT HEART CATHETERIZATION WITH CORONARY ANGIOGRAM N/A 08/08/2013   Procedure: LEFT HEART  CATHETERIZATION WITH CORONARY ANGIOGRAM;  Surgeon: Peter M Martinique, MD;  Location[facility] CATH LAB;  Service: Cardiovascular;  Laterality: N/A;  . ROTATOR CUFF REPAIR  1999   BIlateral  . SIGMOIDECTOMY  2012  . TONSILLECTOMY AND ADENOIDECTOMY    . TOTAL THYROIDECTOMY  1992  . wrist sx     right      Allergies       Allergies  Allergen Reactions  . Methotrexate Derivatives Other (See Comments)    Elevated creat. Levels  . Other Other (See Comments)    NO Blood products.  [name]- causes low blood sugar (pt states she was taking medication but cant remember what it was for.)  . Penicillins Rash    Has patient had a PCN reaction causing immediate rash, facial/tongue/throat swelling, SOB or lightheadedness with hypotension: Yes Has patient had a PCN reaction causing severe rash involving mucus membranes or skin necrosis: Yes  Has patient had a PCN reaction that required hospitalization No Has patient had a PCN reaction occurring within the last 10 years: No If all of the above answers are "NO", then may proceed with Cephalosporin use.  . Enbrel [Etanercept] Other (See Comments)    Inadequate response   . Remicade [Infliximab] Other (See Comments)    Inadequate response  . Zocor [Simvastatin] Other (See Comments)    Leg cramps    HPI  75 y.o.female, followed by Dr. Arnetha Courser history  of carotid artery disease, with bilateral ICA stenosis of up to 40%, mild aortic insufficiency/stenosis, Stage III CKD, diastolic dysfunction, HTN, insulin dependent type 2 diabetes, RA and hypothryoidism. She had a heart catheterization on 08/08/13 which was normal.EF 70%. She has a prior h/o DVT in 2015 and was treated with Xarelto, but discontinued use. 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   She reports she was dealing with RA flare. She had increased joint pain in multiple areas, including her left shoulder. She called her rheumatologist and  arranged an appointment. On 10/18/15, she arrived at the doctor's office. She was walking a short distance from her car to the office, when she developed exertional dyspnea and diaphoresis. She felt hot then developed left sided, sharp chest pain, radiating down her left arm and up into her left neck. EMS was called. She was given 4 baby ASA and SL NTG and the pain resolved. She was transported to [facility] for further w/u.   Cardiac enzymes are negative x 2. BNP normal at 46. EKG shows sinus bradycardia with a HR in the 50s, but otherwise normal EKG w/o ischemic. CBC shows mild anemia with Hgb of 10.4.  BMP shows renal insufficiency with SCr at 1.78 and BUN at 25 on admit. Renal function has improved today with Scr/BUN at 1.50/23. CXR showed Hazy ill-defined opacities in the left mid and lower lung zones. Right lung is clear. Normal heart size. No pneumothorax or pleural effusion. Chest CT demonstrated Bronchiectasis. Mild increased scarring or atelectasis in the left lung responsible for the patchy opacity on radiographs today. There is a tiny left upper lobe subpleural nodule which is new since February and is likely inflammatory. The 2-3 mm right lung nodule seen in February has resolved. No other acute lung finding. Repeat scan in 1 year is recommended only if patient is high risk. There was no presence of pericardial effusion. Calcified coronary artery atherosclerosis and mild calcified aortic atherosclerosis was noted however.   She has been admitted by IM. She is pain free. No resting dyspnea. Unfortunately, she ate breakfast this morning.    Home Medications         Prior to Admission medications   Medication Sig Start Date End Date Taking? Authorizing Provider  abatacept (ORENCIA) 250 MG injection Inject 750 mg into the vein. Infuse 750 mg / 3 vials over 30 minutes every 4 weeks   Yes Historical Provider, MD  acetaminophen (TYLENOL) 500 MG tablet Take 1,000 mg by mouth every 6 (six) hours as  needed for mild pain.   Yes Historical Provider, MD  atorvastatin (LIPITOR) 40 MG tablet Take 40 mg by mouth daily at 6 PM.    Yes Historical Provider, MD  cholecalciferol (VITAMIN D) 1000 UNITS tablet Take 1,000 Units by mouth daily.    Yes Historical Provider, MD  cyclobenzaprine (FLEXERIL) 10 MG tablet Take 10 mg by mouth 3 (three) times daily as needed for muscle spasms.    Yes Historical Provider, MD  diphenhydrAMINE (BENADRYL) 25 MG tablet Take 50 mg by mouth See admin instructions. One hour prior to [place] infusion   Yes Historical Provider, MD  ferrous sulfate (FEOSOL) 325 (65 FE) MG tablet Take 325-650 mg by mouth daily with breakfast.   Yes Historical Provider, MD  folic acid (FOLVITE) 1 MG tablet Take 1 mg by mouth daily.   Yes Historical Provider, MD  Furosemide (LASIX PO) Take 1 tablet by mouth daily as needed (fluid/edema).  Yes Historical Provider, MD  HYDROcodone-acetaminophen (NORCO/VICODIN) 5-325 MG per tablet Take 1-2 tablets by mouth every 3 (three) hours as needed for moderate pain. 08/26/14  Yes Theodis Blaze, MD  insulin NPH-regular Human (NOVOLIN 70/30) (70-30) 100 UNIT/ML injection Inject 6-8 Units into the skin 3 (three) times daily with meals. Sliding source   Yes Historical Provider, MD  levothyroxine (SYNTHROID, LEVOTHROID) 137 MCG tablet Take 137 mcg by mouth daily before breakfast. 10/12/15  Yes Historical Provider, MD  losartan (COZAAR) 50 MG tablet Take 50 mg by mouth every evening.   Yes Historical Provider, MD  metoprolol tartrate (LOPRESSOR) 25 MG tablet Take 1 tablet (25 mg total) by mouth 2 (two) times daily as needed. Patient taking differently: Take 25 mg by mouth daily.  05/07/15  Yes Jerline Pain, MD  multivitamin [facility]) per tablet Take 1 tablet by mouth daily.    Yes Historical Provider, MD  pregabalin (LYRICA) 75 MG capsule Take 75 mg by mouth 2 (two) times daily.    Yes Historical Provider, MD   Hospital Medications  .  aspirin EC  81 mg Oral Daily  . atorvastatin  40 mg Oral q1800  . cholecalciferol  1,000 Units Oral Daily  . ferrous sulfate  325 mg Oral Q breakfast  . folic acid  1 mg Oral Daily  . heparin  5,000 Units Subcutaneous Q8H  . insulin aspart  0-5 Units Subcutaneous QHS  . insulin aspart  0-9 Units Subcutaneous TID WC  . insulin glargine  5 Units Subcutaneous QHS  . levothyroxine  137 mcg Oral QAC breakfast  . metoprolol tartrate  25 mg Oral Daily  . multivitamin with minerals  1 tablet Oral Daily  . pregabalin  75 mg Oral BID  . sodium chloride flush  3 mL Intravenous Q12H    Family History       Family History  Problem Relation Age of Onset  . Hypotension Mother   . CVA Mother   . Diabetes Mother   . Hypertension Father   . Kidney failure Father     Social History  Social History        Social History  . Marital status: Married    Spouse name: N/A  . Number of children: N/A  . Years of education: N/A      Occupational History  . Not on file.         Social History Main Topics  . Smoking status: Former Smoker    Quit date: 01/06/1962  . Smokeless tobacco: Never Used  . Alcohol use Yes     Comment: occasionally  . Drug use: No  . Sexual activity: Not on file       Other Topics Concern  . Not on file      Social History Narrative  . No narrative on file     Review of Systems General:  No chills, fever, night sweats or weight changes.  Cardiovascular:  No chest pain, dyspnea on exertion, edema, orthopnea, palpitations, paroxysmal nocturnal dyspnea. Dermatological: No rash, lesions/masses Respiratory: No cough, dyspnea Urologic: No hematuria, dysuria Abdominal:   No nausea, vomiting, diarrhea, bright red blood per rectum, melena, or hematemesis Neurologic:  No visual changes, wkns, changes in mental status. All other systems reviewed and are otherwise negative except as noted above.  Physical Exam  Blood pressure (!) 134/46,  pulse (!) 51, temperature 98.4 F (36.9 C), temperature source Oral, resp. rate 18, height 5\' 4"  (1.626 m), weight 189  lb 4.8 oz (85.9 kg), SpO2 99 %.  General: Pleasant, NAD Psych: Normal affect. Neuro: Alert and oriented X 3. Moves all extremities spontaneously. HEENT: Normal           Neck: Supple without bruits or JVD. Lungs:  Resp regular and unlabored, CTA. Heart: RRR 2/6 murmur at RUSB and LUSB, loudest along RUSB Abdomen: Soft, non-tender, non-distended, BS + x 4.  Extremities: no cyanosis, no pitting edema. RA in multiple joints DP/PT/Radials 2+ and equal bilaterally.  Labs  Troponin (Point of Care Test)  Recent Labs (last 2 labs)    Recent Labs  10/18/15 2156  TROPIPOC 0.02      Recent Labs (last 2 labs)    Recent Labs  10/18/15 2337 10/19/15 0600  TROPONINI <0.03 <0.03     Recent Labs       Lab Results  Component Value Date   WBC 10.1 10/18/2015   HGB 10.4 (L) 10/18/2015   HCT 31.2 (L) 10/18/2015   MCV 82.8 10/18/2015   PLT 312 10/18/2015       Last Labs    Recent Labs Lab 10/19/15 0600  NA 139  K 3.7  CL 108  CO2 22  BUN 23*  CREATININE 1.50*  CALCIUM 9.0  GLUCOSE 165*     Recent Labs       Lab Results  Component Value Date   CHOL 205 (H) 08/06/2013   HDL 84 08/06/2013   LDLCALC 96 08/06/2013   TRIG 126 08/06/2013     Recent Labs        Lab Results  Component Value Date   DDIMER (H) 07/12/2007    0.93        AT THE INHOUSE ESTABLISHED CUTOFF VALUE OF 0.48 ug/mL FEU, THIS ASSAY HAS BEEN DOCUMENTED IN THE LITERATURE TO HAVE       Radiology/Studies   Imaging Results  Dg Chest 2 View  Result Date: 10/18/2015 CLINICAL DATA:  Chest pain for 1 day EXAM: CHEST  2 VIEW COMPARISON:  09/06/2015 FINDINGS: Hazy ill-defined opacities in the left mid and lower lung zones. Right lung is clear. Normal heart size. No pneumothorax or pleural effusion. IMPRESSION: Patchy left lung airspace opacity. Followup  PA and lateral chest X-ray is recommended in 3-4 weeks following trial of antibiotic therapy to ensure resolution and exclude underlying malignancy. Electronically Signed   By: Marybelle Killings M.D.   On: 10/18/2015 15:02   Ct Chest Wo Contrast  Result Date: 10/18/2015 CLINICAL DATA:  75 year old female with shortness of Breath since this morning. Abnormal left lung opacity on radiographs today. Initial encounter. EXAM: CT CHEST WITHOUT CONTRAST TECHNIQUE: Multidetector CT imaging of the chest was performed following the standard protocol without IV contrast. COMPARISON:  Chest radiographs 1459 hours today. Chest CT 02/14/2015. FINDINGS: Cardiovascular: No pericardial effusion. Calcified coronary artery atherosclerosis. Mild calcified aortic atherosclerosis. Mediastinum/Nodes: No lymphadenopathy. Lungs/Pleura: Curvilinear opacity about the left hilum involving the lingula and lateral basal segment of the lower lobe appears to correspond to the radiographic finding today and most resembles progressive scarring or atelectasis. Left lower lobe bronchiectasis is more apparent than in February (series 205, image 87). Other left perihilar and upper lobe bronchiectasis also noted. There is a tiny left anterior upper lobe subpleural nodule on series 205, image 55 which is new and appears inflammatory. Major airways are patent. Chronic right lower lobe bronchiectasis and volume loss in the medial basal segment appears stable. Mild scarring or atelectasis in the right costophrenic angle  and superior segment right lower lobe. Previously seen subtle 2-3 mm right upper lobe nodule has resolved and was inflammatory. Mild right upper lobe bronchiectasis. No pleural effusion. Upper Abdomen: Stable and negative visualized noncontrast liver, gallbladder, spleen, pancreas, adrenal glands, and kidneys. Negative visualized bowel. Musculoskeletal: Heterogeneous bone mineralization throughout the spine appears stable. No acute osseous  abnormality identified. IMPRESSION: 1. Bronchiectasis. Mild increased scarring or atelectasis in the left lung responsible for the patchy opacity on radiographs today. 2. There is a tiny left upper lobe subpleural nodule which is new since February and is likely inflammatory. The 2-3 mm right lung nodule seen in February has resolved. No other acute lung finding. No follow-up needed if patient is low-risk. Non-contrast chest CT can be considered in 12 months if patient is high-risk. This recommendation follows the consensus statement: Guidelines for Management of Incidental Pulmonary Nodules Detected on CT Images: From the Fleischner Society 2017; Radiology 2017; 284:228-243. 3. Calcified coronary artery and to a lesser extent aortic atherosclerosis. Electronically Signed   By: Genevie Ann M.D.   On: 10/18/2015 17:39     ECG  Sinus bradycardia. HR in the 50s. Otherwise normal. No ischemia.    ASSESSMENT AND PLAN  Principal Problem:   Chest pain Active Problems:   Aortic stenosis   HTN (hypertension)   Chronic congestive heart failure with left ventricular diastolic dysfunction (HCC)   Hypothyroidism   Chronic pain   Diabetes mellitus, type II, insulin dependent (HCC)   Rheumatoid arthritis (HCC)   AKI (acute kidney injury) (HCC)   CKD (chronic kidney disease) stage 3, GFR 30-59 ml/min   Lung nodule seen on imaging study  1. Chest Pain: mixed typical + atypical features. She has ruled out for MI and is currently CP free. EKG shows sinus bradycardia but otherwise normal w/o ischemia. She has multiple risk factors for heart disease, including IDDM, HTN, and RA. She also has CKD. Chest CT this admit also showed calcified coronary artery atherosclerosis. She did have a normal cath in 2015. However given her symptoms and risk factors, recommend NST to reassess for ischemia. She did eat a full breakfast this am. Will have MD to assess. If stress test is agreed on, will need to schedule for  tomorrow. NPO at midnight. Control risk factor reduction, with medical management of HTN, HLD and DM.   2. Aortic Stenosis/ Insufficiency: Mild on prior echo in 2015. F/u echo pending.   3. IDDM: per IM.  4. CKD: SCr improving from 1.7>>1.5. Continue to monitor.   5. HTN: controlled on current regimen.   6. HLD: continue statin therapy with Lipitor.   Signed, Lyda Jester, PA-C 10/19/2015, 9:29 AM      History and all data above reviewed.  Patient examined.  I agree with the findings as above. Chest pain is atypical.  No objective evidence of ischemia.  The patient exam reveals COR:RRR with brief systolic murmur.   ,  Lungs: Clear  ,  Abd: Positive bowel sounds, no rebound no guarding, Ext No edema  .  All available labs, radiology testing, previous records reviewed. Agree with documented assessment and plan. Chest pain:  Atypical greater than typical features.  Plan Lexiscan Myoview in the AM.    Minus Breeding  3:52 PM  10/19/2015

## 2015-10-19 NOTE — Progress Notes (Signed)
PROGRESS NOTE    [name]  KDX:[id number] DOB: February 22, 1940 DOA: 10/18/2015 PCP: Lilian Coma, MD     Brief Narrative:  [name] is a 75 y.o. female with medical history significant for chronic kidney disease stage III, insulin-dependent diabetes mellitus, hypertension, hypothyroidism, rheumatoid arthritis, and chronic diastolic CHF who presents to the emergency department with complaints of chest pain. Patient reports that she awoke in her usual state health, but went on to develop a sharp stabbing, moderate in intensity chest pain, with radiation to the left arm, neck, and back. Pain developed while at rest and was associated with dyspnea, lightheadedness, and diaphoresis. Patient reports having similar symptoms once before and was hospitalized with negative cardiac workup. Patient activated EMS for transport to the hospital. Patient received 324 mg aspirin and one sublingual nitroglycerin en route to the hospital with relief of chest pain.   Assessment & Plan:   Principal Problem:   Chest pain Active Problems:   Aortic stenosis   HTN (hypertension)   Chronic congestive heart failure with left ventricular diastolic dysfunction (HCC)   Hypothyroidism   Chronic pain   Diabetes mellitus, type II, insulin dependent (HCC)   Rheumatoid arthritis (HCC)   AKI (acute kidney injury) (HCC)   CKD (chronic kidney disease) stage 3, GFR 30-59 ml/min   Lung nodule seen on imaging study   Chest pain  - Describes pain as left-sided, sharp, stabbing, but not reproducible, with radiation to left upper abdomen, was associated with shortness of breath, diaphoresis, lightheadedness. Chest pain improved with nitroglycerin. Also has some chest wall pain that is tender to palpation also left ribs, but states this is a different pain than the initial chest pain from prior to admission.  - Cath on 08/08/2013: normal coronary anatomy - Initial EKG with sinus bradycardia, but no ST-T changes      - Serial troponin negative, cont to trend  - Continue Aspirin, Lipitor, continue Lopressor as HR allows  - Obtain echo  - Consult cardiology   AKI superimposed on CKD stage III  - Baseline Cr 1.2  - Possibly dehydration and prerenal azotemia from newly prescribed lasix  - Hold lasix, losartan  - IVF - Monitor BMP   Chronic diastolic CHF and Aortic stenosis  - TTE (08/05/13) with EF 65-70%, mild AS, mild MR, and mild LAE  - Hold Lasix and ARB given AKI and apparent dehydration  - Follow daily wts and strict I/O's     Insulin-dependent DM  - Ha1c pending  - Managed with Novolin 70/30 at home, 6-8 units TID per sliding-scale; will hold this while here  - Start with Lantus 5 units qHS and a low-intensity SSI    Hypertension  - At goal currently  - Continue Lopressor as HR allows  - Hold losartan until renal fxn stabilizes    Hypothyroidism  - Appears to be stable, continue Synthroid   Rheumatoid arthritis with chronic pain  - Stable, followed by rheumatology with abatacept infusions - Continue home-regimen of prn Norco    Sinus bradycardia - HR in mid-50's; pt is asymptomatic on admission  - Likely secondary to Lopressor; ruling-out ACS as above   - Continue Lopressor with holding parameters  Incidental lung nodule on CT - Tiny LUL subpleural nodule noted on CT  - Discussed with patient who will follow-up with PCP for follow-up as deemed appropriate   DVT prophylaxis: subq hep Code Status: full Family Communication: no family at bedside Disposition Plan: pending further work  up    Consultants:   Cardiology   Procedures:   None  Antimicrobials:   None    Subjective: Patient states that her chest pain started yesterday while waiting for her rheumatology appointment. She describes as a sharp stabbing left-sided chest pain that was not reproducible at the time. Chest pain was associated with lightheadedness, diaphoresis, shortness of breath. She  states that she has once had chest pain a few years ago with negative workup. Nitroglycerin was administered en route to hospital, at which point her chest pain resolved. She has not had any recurrence of chest pain since. She does have some tenderness to palpation along her left ribs, which she describes as soreness along her ribs on the left side, but states that this is a different pain than the pain that initially brought her to the hospital.   Objective: Vitals:   10/18/15 2200 10/18/15 2320 10/19/15 0600 10/19/15 0639  BP: (!) 140/53 (!) 142/46 (!) 134/46 (!) 134/46  Pulse: (!) 53 (!) 54 61 (!) 51  Resp: 14 19  18   Temp:  98.5 F (36.9 C)  98.4 F (36.9 C)  TempSrc:  Oral  Oral  SpO2: 98% 100%  99%  Weight:  78.6 kg (173 lb 4.8 oz)  85.9 kg (189 lb 4.8 oz)  Height:        Intake/Output Summary (Last 24 hours) at 10/19/15 0826 Last data filed at 10/19/15 0641  Gross per 24 hour  Intake           417.33 ml  Output              376 ml  Net            41.33 ml   Filed Weights   10/18/15 1426 10/18/15 2320 10/19/15 0639  Weight: 89.8 kg (198 lb) 78.6 kg (173 lb 4.8 oz) 85.9 kg (189 lb 4.8 oz)    Examination:  General exam: Appears calm and comfortable  Respiratory system: Clear to auscultation. Respiratory effort normal. Cardiovascular system: S1 & S2 heard, RRR. No JVD, +systolic murmur 3/6. No pedal edema. Gastrointestinal system: Abdomen is nondistended, soft and mildly tender on LUQ. No organomegaly or masses felt. Normal bowel sounds heard. Central nervous system: Alert and oriented. No focal neurological deficits. Extremities: Symmetric 5 x 5 power. Skin: No rashes, lesions or ulcers Psychiatry: Judgement and insight appear normal. Mood & affect appropriate.   Data Reviewed: I have personally reviewed following labs and imaging studies  CBC:  Recent Labs Lab 10/18/15 1710  WBC 10.1  HGB 10.4*  HCT 31.2*  MCV 82.8  PLT 427   Basic Metabolic Panel:  Recent  Labs Lab 10/18/15 1710 10/19/15 0600  NA 138 139  K 3.7 3.7  CL 107 108  CO2 24 22  GLUCOSE 96 165*  BUN 25* 23*  CREATININE 1.78* 1.50*  CALCIUM 9.2 9.0   GFR: Estimated Creatinine Clearance: 33.8 mL/min (by C-G formula based on SCr of 1.5 mg/dL (H)). Liver Function Tests: No results for input(s): AST, ALT, ALKPHOS, BILITOT, PROT, ALBUMIN in the last 168 hours. No results for input(s): LIPASE, AMYLASE in the last 168 hours. No results for input(s): AMMONIA in the last 168 hours. Coagulation Profile: No results for input(s): INR, PROTIME in the last 168 hours. Cardiac Enzymes:  Recent Labs Lab 10/18/15 2337 10/19/15 0600  TROPONINI <0.03 <0.03   BNP (last 3 results) No results for input(s): PROBNP in the last 8760 hours. HbA1C: No results  for input(s): HGBA1C in the last 72 hours. CBG:  Recent Labs Lab 10/18/15 1533 10/19/15 0041 10/19/15 0620  GLUCAP 67 112* 156*   Lipid Profile: No results for input(s): CHOL, HDL, LDLCALC, TRIG, CHOLHDL, LDLDIRECT in the last 72 hours. Thyroid Function Tests: No results for input(s): TSH, T4TOTAL, FREET4, T3FREE, THYROIDAB in the last 72 hours. Anemia Panel: No results for input(s): VITAMINB12, FOLATE, FERRITIN, TIBC, IRON, RETICCTPCT in the last 72 hours. Sepsis Labs: No results for input(s): PROCALCITON, LATICACIDVEN in the last 168 hours.  No results found for this or any previous visit (from the past 240 hour(s)).     Radiology Studies: Dg Chest 2 View  Result Date: 10/18/2015 CLINICAL DATA:  Chest pain for 1 day EXAM: CHEST  2 VIEW COMPARISON:  09/06/2015 FINDINGS: Hazy ill-defined opacities in the left mid and lower lung zones. Right lung is clear. Normal heart size. No pneumothorax or pleural effusion. IMPRESSION: Patchy left lung airspace opacity. Followup PA and lateral chest X-ray is recommended in 3-4 weeks following trial of antibiotic therapy to ensure resolution and exclude underlying malignancy.  Electronically Signed   By: Marybelle Killings M.D.   On: 10/18/2015 15:02   Ct Chest Wo Contrast  Result Date: 10/18/2015 CLINICAL DATA:  75 year old female with shortness of Breath since this morning. Abnormal left lung opacity on radiographs today. Initial encounter. EXAM: CT CHEST WITHOUT CONTRAST TECHNIQUE: Multidetector CT imaging of the chest was performed following the standard protocol without IV contrast. COMPARISON:  Chest radiographs 1459 hours today. Chest CT 02/14/2015. FINDINGS: Cardiovascular: No pericardial effusion. Calcified coronary artery atherosclerosis. Mild calcified aortic atherosclerosis. Mediastinum/Nodes: No lymphadenopathy. Lungs/Pleura: Curvilinear opacity about the left hilum involving the lingula and lateral basal segment of the lower lobe appears to correspond to the radiographic finding today and most resembles progressive scarring or atelectasis. Left lower lobe bronchiectasis is more apparent than in February (series 205, image 87). Other left perihilar and upper lobe bronchiectasis also noted. There is a tiny left anterior upper lobe subpleural nodule on series 205, image 55 which is new and appears inflammatory. Major airways are patent. Chronic right lower lobe bronchiectasis and volume loss in the medial basal segment appears stable. Mild scarring or atelectasis in the right costophrenic angle and superior segment right lower lobe. Previously seen subtle 2-3 mm right upper lobe nodule has resolved and was inflammatory. Mild right upper lobe bronchiectasis. No pleural effusion. Upper Abdomen: Stable and negative visualized noncontrast liver, gallbladder, spleen, pancreas, adrenal glands, and kidneys. Negative visualized bowel. Musculoskeletal: Heterogeneous bone mineralization throughout the spine appears stable. No acute osseous abnormality identified. IMPRESSION: 1. Bronchiectasis. Mild increased scarring or atelectasis in the left lung responsible for the patchy opacity on  radiographs today. 2. There is a tiny left upper lobe subpleural nodule which is new since February and is likely inflammatory. The 2-3 mm right lung nodule seen in February has resolved. No other acute lung finding. No follow-up needed if patient is low-risk. Non-contrast chest CT can be considered in 12 months if patient is high-risk. This recommendation follows the consensus statement: Guidelines for Management of Incidental Pulmonary Nodules Detected on CT Images: From the Fleischner Society 2017; Radiology 2017; 284:228-243. 3. Calcified coronary artery and to a lesser extent aortic atherosclerosis. Electronically Signed   By: Genevie Ann M.D.   On: 10/18/2015 17:39      Scheduled Meds: . aspirin EC  81 mg Oral Daily  . atorvastatin  40 mg Oral q1800  . cholecalciferol  1,000  Units Oral Daily  . ferrous sulfate  325 mg Oral Q breakfast  . folic acid  1 mg Oral Daily  . heparin  5,000 Units Subcutaneous Q8H  . insulin aspart  0-5 Units Subcutaneous QHS  . insulin aspart  0-9 Units Subcutaneous TID WC  . insulin glargine  5 Units Subcutaneous QHS  . levothyroxine  137 mcg Oral QAC breakfast  . metoprolol tartrate  25 mg Oral Daily  . multivitamin with minerals  1 tablet Oral Daily  . pregabalin  75 mg Oral BID  . sodium chloride flush  3 mL Intravenous Q12H   Continuous Infusions: . sodium chloride 70 mL/hr at 10/19/15 0209     LOS: 0 days    Time spent: 40 minutes    Dessa Phi, DO [facility] Pager [phone]  If 7PM-7AM, please contact night-coverage [email or web address] Password [email or web address] 10/19/2015, 8:26 AM

## 2015-10-19 NOTE — Care Management Obs Status (Signed)
[facility] NOTIFICATION   Patient Details  Name: [name] MRN: [id number] Date of Birth: 1940-09-13   Medicare Observation Status Notification Given:       Royston Bake, RN 10/19/2015, 4:02 PM

## 2015-10-19 NOTE — Progress Notes (Signed)
Note entered in error. [name]

## 2015-10-19 NOTE — Care Management Note (Signed)
Case Management Note  Patient Details  Name: [name] MRN: [id number] Date of Birth: 1940-10-21  Subjective/Objective:      Admitted with Chest Pain              Action/Plan: Patient lives at home with spouse, continues to drive her car. PCP is Dr Cheron Schaumann; Private insurance with Medicare/ BCBS with prescription drug coverage; pharmacy of choice is [facility]. Patient reports no problem getting her medication; DME - she has a walker. Cane, shower chair, wheel chair. No needs identified. CM will continue to follow for DCP  Expected Discharge Date:     Possibly 10/20/2015             Expected Discharge Plan:  Home/Self Care  Discharge planning Services  CM Consult  Status of Service:  In process, will continue to follow  Sherrilyn Rist [phone] 10/19/2015, 4:02 PM

## 2015-10-20 DIAGNOSIS — I350 Nonrheumatic aortic (valve) stenosis: Secondary | ICD-10-CM | POA: Diagnosis not present

## 2015-10-20 DIAGNOSIS — I5032 Chronic diastolic (congestive) heart failure: Secondary | ICD-10-CM | POA: Diagnosis not present

## 2015-10-20 DIAGNOSIS — R072 Precordial pain: Secondary | ICD-10-CM | POA: Diagnosis not present

## 2015-10-20 DIAGNOSIS — N179 Acute kidney failure, unspecified: Secondary | ICD-10-CM | POA: Diagnosis not present

## 2015-10-20 LAB — HEMOGLOBIN A1C
Hgb A1c MFr Bld: 6.6 % — ABNORMAL HIGH (ref 4.8–5.6)
Mean Plasma Glucose: 143 mg/dL

## 2015-10-20 LAB — BASIC METABOLIC PANEL
Anion gap: 8 (ref 5–15)
BUN: 18 mg/dL (ref 6–20)
CHLORIDE: 109 mmol/L (ref 101–111)
CO2: 24 mmol/L (ref 22–32)
Calcium: 9.3 mg/dL (ref 8.9–10.3)
Creatinine, Ser: 1.2 mg/dL — ABNORMAL HIGH (ref 0.44–1.00)
GFR calc non Af Amer: 43 mL/min — ABNORMAL LOW (ref >60)
GFR, EST AFRICAN AMERICAN: 50 mL/min — AB (ref >60)
Glucose, Bld: 124 mg/dL — ABNORMAL HIGH (ref 65–99)
POTASSIUM: 4.1 mmol/L (ref 3.5–5.1)
SODIUM: 141 mmol/L (ref 135–145)

## 2015-10-20 LAB — GLUCOSE, CAPILLARY
GLUCOSE-CAPILLARY: 122 mg/dL — AB (ref 65–99)
GLUCOSE-CAPILLARY: 120 mg/dL — AB (ref 65–99)
GLUCOSE-CAPILLARY: 166 mg/dL — AB (ref 65–99)
Glucose-Capillary: 153 mg/dL — ABNORMAL HIGH (ref 65–99)
Glucose-Capillary: 116 mg/dL — ABNORMAL HIGH (ref 65–99)

## 2015-10-20 NOTE — Progress Notes (Signed)
Pt was NPO when I got report on her this morning, Called down to the stress test, they said they do not have her name on the list, called cardio PA, nothing heard back, called MD, she said keep pt NPO until cardiologist come by and see her. But later pt said that she cannot take it anymore, extremely hungry and week, checked her CBG it was 120, but pt could not help herself to eat, MD aware.

## 2015-10-20 NOTE — Progress Notes (Signed)
PROGRESS NOTE    [name]  JOA:[id number] DOB: 14-Aug-1940 DOA: 10/18/2015 PCP: Lilian Coma, MD     Brief Narrative:  [name] is a 75 y.o. female with medical history significant for chronic kidney disease stage III, insulin-dependent diabetes mellitus, hypertension, hypothyroidism, rheumatoid arthritis, and chronic diastolic CHF who presents to the emergency department with complaints of chest pain. Patient reports that she awoke in her usual state health, but went on to develop a sharp stabbing, moderate in intensity chest pain, with radiation to the left arm, neck, and back. Pain developed while at rest and was associated with dyspnea, lightheadedness, and diaphoresis. Patient reports having similar symptoms once before and was hospitalized with negative cardiac workup. Patient activated EMS for transport to the hospital. Patient received 324 mg aspirin and one sublingual nitroglycerin en route to the hospital with relief of chest pain.   Assessment & Plan:   Principal Problem:   Chest pain Active Problems:   Aortic stenosis   HTN (hypertension)   Chronic congestive heart failure with left ventricular diastolic dysfunction (HCC)   Hypothyroidism   Chronic pain   Diabetes mellitus, type II, insulin dependent (HCC)   Rheumatoid arthritis (HCC)   AKI (acute kidney injury) (HCC)   CKD (chronic kidney disease) stage 3, GFR 30-59 ml/min   Lung nodule seen on imaging study   Chest pain  - Describes pain as left-sided, sharp, stabbing, but not reproducible, with radiation to left upper abdomen, was associated with shortness of breath, diaphoresis, lightheadedness. Chest pain improved with nitroglycerin. Also has some chest wall pain that is tender to palpation also left ribs, but states this is a different pain than the initial chest pain from prior to admission.  - Cath on 08/08/2013: normal coronary anatomy - Initial EKG with sinus bradycardia, but no ST-T changes      - Serial troponin negative - Continue Aspirin, Lipitor, continue Lopressor as HR allows  - Echo: EF 60-10%, normal diastolic function, normal wall motion.  - Cardiology following; lexiscan stress tomorrow 10/15  AKI superimposed on CKD stage III  - Baseline Cr 1.2  - Possibly dehydration and prerenal azotemia from newly prescribed lasix  - Hold lasix, losartan  - Improving with IVF   Chronic diastolic CHF and Aortic stenosis  - TTE (08/05/13): EF 65-70%, mild AS, mild MR, and mild LAE  - Echo (10/19/15): EF 93-23%, normal diastolic function, normal wall motion.  - Hold Lasix and ARB given AKI and apparent dehydration  - Follow daily wts and strict I/O's     Insulin-dependent Dm, well controlled  - Ha1c 6.6 - Managed with Novolin 70/30 at home, 6-8 units TID per sliding-scale; will hold this while here  - Start with Lantus 5 units qHS and a low-intensity SSI    Hypertension  - Continue Lopressor as HR allows  - Hold losartan until renal fxn stabilizes    Hypothyroidism  - Appears to be stable, continue Synthroid   Rheumatoid arthritis with chronic pain  - Stable, followed by rheumatology with abatacept infusions - Continue home-regimen of prn Norco    Sinus bradycardia - HR in mid-50's; pt is asymptomatic on admission  - Likely secondary to Lopressor; ruling-out ACS as above   - Continue Lopressor with holding parameters  Incidental lung nodule on CT - Tiny LUL subpleural nodule noted on CT  - Discussed with patient who will follow-up with PCP for follow-up as deemed appropriate   DVT prophylaxis: subq hep Code Status:  full Family Communication: no family at bedside Disposition Plan: pending further work up    Consultants:   Cardiology   Procedures:   None  Antimicrobials:   None    Subjective: Patient states that her chest pain has now resolved. Lexiscan rescheduled for tomorrow morning. Otherwise, patient is doing well, denies any shortness  of breath, nausea, vomiting, diarrhea, abdominal pain. Chest pain has not not recurred since admission.  Objective: Vitals:   10/19/15 1127 10/19/15 2046 10/20/15 0511 10/20/15 1123  BP: (!) 123/57 (!) 138/47 (!) 152/54 (!) 144/53  Pulse: (!) 51 (!) 51 (!) 52 (!) 48  Resp: 16 16 16 16   Temp: 98.1 F (36.7 C) 98.5 F (36.9 C) 98.5 F (36.9 C) 98.7 F (37.1 C)  TempSrc: Oral Oral Oral Oral  SpO2: 100% 100% 94% 100%  Weight:   88 kg (193 lb 14.4 oz)   Height:        Intake/Output Summary (Last 24 hours) at 10/20/15 1430 Last data filed at 10/20/15 1429  Gross per 24 hour  Intake              720 ml  Output             1541 ml  Net             -821 ml   Filed Weights   10/18/15 2320 10/19/15 0639 10/20/15 0511  Weight: 78.6 kg (173 lb 4.8 oz) 85.9 kg (189 lb 4.8 oz) 88 kg (193 lb 14.4 oz)    Examination:  General exam: Appears calm and comfortable  Respiratory system: Clear to auscultation. Respiratory effort normal. Cardiovascular system: S1 & S2 heard, RRR. No JVD, +systolic murmur 3/6. No pedal edema. Gastrointestinal system: Abdomen is nondistended, soft and mildly tender on LUQ. No organomegaly or masses felt. Normal bowel sounds heard. Central nervous system: Alert and oriented. No focal neurological deficits. Extremities: Symmetric 5 x 5 power. Skin: No rashes, lesions or ulcers Psychiatry: Judgement and insight appear normal. Mood & affect appropriate.   Data Reviewed: I have personally reviewed following labs and imaging studies  CBC:  Recent Labs Lab 10/18/15 1710  WBC 10.1  HGB 10.4*  HCT 31.2*  MCV 82.8  PLT 570   Basic Metabolic Panel:  Recent Labs Lab 10/18/15 1710 10/19/15 0600 10/20/15 0532  NA 138 139 141  K 3.7 3.7 4.1  CL 107 108 109  CO2 24 22 24   GLUCOSE 96 165* 124*  BUN 25* 23* 18  CREATININE 1.78* 1.50* 1.20*  CALCIUM 9.2 9.0 9.3   GFR: Estimated Creatinine Clearance: 42.8 mL/min (by C-G formula based on SCr of 1.2 mg/dL  (H)). Liver Function Tests: No results for input(s): AST, ALT, ALKPHOS, BILITOT, PROT, ALBUMIN in the last 168 hours. No results for input(s): LIPASE, AMYLASE in the last 168 hours. No results for input(s): AMMONIA in the last 168 hours. Coagulation Profile: No results for input(s): INR, PROTIME in the last 168 hours. Cardiac Enzymes:  Recent Labs Lab 10/18/15 2337 10/19/15 0600 10/19/15 1100  TROPONINI <0.03 <0.03 <0.03   BNP (last 3 results) No results for input(s): PROBNP in the last 8760 hours. HbA1C:  Recent Labs  10/18/15 2337  HGBA1C 6.6*   CBG:  Recent Labs Lab 10/19/15 1658 10/19/15 2147 10/20/15 0527 10/20/15 1004 10/20/15 1122  GLUCAP 129* 131* 122* 120* 153*   Lipid Profile: No results for input(s): CHOL, HDL, LDLCALC, TRIG, CHOLHDL, LDLDIRECT in the last 72 hours. Thyroid Function Tests:  No results for input(s): TSH, T4TOTAL, FREET4, T3FREE, THYROIDAB in the last 72 hours. Anemia Panel: No results for input(s): VITAMINB12, FOLATE, FERRITIN, TIBC, IRON, RETICCTPCT in the last 72 hours. Sepsis Labs: No results for input(s): PROCALCITON, LATICACIDVEN in the last 168 hours.  No results found for this or any previous visit (from the past 240 hour(s)).     Radiology Studies: Dg Chest 2 View  Result Date: 10/18/2015 CLINICAL DATA:  Chest pain for 1 day EXAM: CHEST  2 VIEW COMPARISON:  09/06/2015 FINDINGS: Hazy ill-defined opacities in the left mid and lower lung zones. Right lung is clear. Normal heart size. No pneumothorax or pleural effusion. IMPRESSION: Patchy left lung airspace opacity. Followup PA and lateral chest X-ray is recommended in 3-4 weeks following trial of antibiotic therapy to ensure resolution and exclude underlying malignancy. Electronically Signed   By: Marybelle Killings M.D.   On: 10/18/2015 15:02   Ct Chest Wo Contrast  Result Date: 10/18/2015 CLINICAL DATA:  75 year old female with shortness of Breath since this morning. Abnormal left  lung opacity on radiographs today. Initial encounter. EXAM: CT CHEST WITHOUT CONTRAST TECHNIQUE: Multidetector CT imaging of the chest was performed following the standard protocol without IV contrast. COMPARISON:  Chest radiographs 1459 hours today. Chest CT 02/14/2015. FINDINGS: Cardiovascular: No pericardial effusion. Calcified coronary artery atherosclerosis. Mild calcified aortic atherosclerosis. Mediastinum/Nodes: No lymphadenopathy. Lungs/Pleura: Curvilinear opacity about the left hilum involving the lingula and lateral basal segment of the lower lobe appears to correspond to the radiographic finding today and most resembles progressive scarring or atelectasis. Left lower lobe bronchiectasis is more apparent than in February (series 205, image 87). Other left perihilar and upper lobe bronchiectasis also noted. There is a tiny left anterior upper lobe subpleural nodule on series 205, image 55 which is new and appears inflammatory. Major airways are patent. Chronic right lower lobe bronchiectasis and volume loss in the medial basal segment appears stable. Mild scarring or atelectasis in the right costophrenic angle and superior segment right lower lobe. Previously seen subtle 2-3 mm right upper lobe nodule has resolved and was inflammatory. Mild right upper lobe bronchiectasis. No pleural effusion. Upper Abdomen: Stable and negative visualized noncontrast liver, gallbladder, spleen, pancreas, adrenal glands, and kidneys. Negative visualized bowel. Musculoskeletal: Heterogeneous bone mineralization throughout the spine appears stable. No acute osseous abnormality identified. IMPRESSION: 1. Bronchiectasis. Mild increased scarring or atelectasis in the left lung responsible for the patchy opacity on radiographs today. 2. There is a tiny left upper lobe subpleural nodule which is new since February and is likely inflammatory. The 2-3 mm right lung nodule seen in February has resolved. No other acute lung finding.  No follow-up needed if patient is low-risk. Non-contrast chest CT can be considered in 12 months if patient is high-risk. This recommendation follows the consensus statement: Guidelines for Management of Incidental Pulmonary Nodules Detected on CT Images: From the Fleischner Society 2017; Radiology 2017; 284:228-243. 3. Calcified coronary artery and to a lesser extent aortic atherosclerosis. Electronically Signed   By: Genevie Ann M.D.   On: 10/18/2015 17:39      Scheduled Meds: . aspirin EC  81 mg Oral Daily  . atorvastatin  40 mg Oral q1800  . cholecalciferol  1,000 Units Oral Daily  . ferrous sulfate  325 mg Oral Q breakfast  . folic acid  1 mg Oral Daily  . heparin  5,000 Units Subcutaneous Q8H  . insulin aspart  0-5 Units Subcutaneous QHS  . insulin aspart  0-9 Units  Subcutaneous TID WC  . insulin glargine  5 Units Subcutaneous QHS  . levothyroxine  137 mcg Oral QAC breakfast  . metoprolol tartrate  25 mg Oral Daily  . multivitamin with minerals  1 tablet Oral Daily  . pregabalin  75 mg Oral BID  . sodium chloride flush  3 mL Intravenous Q12H   Continuous Infusions:     LOS: 0 days    Time spent: 17minutes    Dessa Phi, DO [facility] Pager [phone]  If 7PM-7AM, please contact night-coverage [email or web address] Password [email or web address] 10/20/2015, 2:30 PM

## 2015-10-20 NOTE — Progress Notes (Signed)
TELEMETRY: Reviewed telemetry pt in NSR: Vitals:   10/19/15 1127 10/19/15 2046 10/20/15 0511 10/20/15 1123  BP: (!) 123/57 (!) 138/47 (!) 152/54 (!) 144/53  Pulse: (!) 51 (!) 51 (!) 52 (!) 48  Resp: 16 16 16 16   Temp: 98.1 F (36.7 C) 98.5 F (36.9 C) 98.5 F (36.9 C) 98.7 F (37.1 C)  TempSrc: Oral Oral Oral Oral  SpO2: 100% 100% 94% 100%  Weight:   193 lb 14.4 oz (88 kg)   Height:        Intake/Output Summary (Last 24 hours) at 10/20/15 1322 Last data filed at 10/20/15 0910  Gross per 24 hour  Intake              480 ml  Output             1221 ml  Net             -741 ml   Filed Weights   10/18/15 2320 10/19/15 0639 10/20/15 0511  Weight: 173 lb 4.8 oz (78.6 kg) 189 lb 4.8 oz (85.9 kg) 193 lb 14.4 oz (88 kg)    Subjective  Patient is without chest pain. Upset that stress test was not ordered for today.  Marland Kitchen aspirin EC  81 mg Oral Daily  . atorvastatin  40 mg Oral q1800  . cholecalciferol  1,000 Units Oral Daily  . ferrous sulfate  325 mg Oral Q breakfast  . folic acid  1 mg Oral Daily  . heparin  5,000 Units Subcutaneous Q8H  . insulin aspart  0-5 Units Subcutaneous QHS  . insulin aspart  0-9 Units Subcutaneous TID WC  . insulin glargine  5 Units Subcutaneous QHS  . levothyroxine  137 mcg Oral QAC breakfast  . metoprolol tartrate  25 mg Oral Daily  . multivitamin with minerals  1 tablet Oral Daily  . pregabalin  75 mg Oral BID  . sodium chloride flush  3 mL Intravenous Q12H      LABS: Basic Metabolic Panel:  Recent Labs  10/19/15 0600 10/20/15 0532  NA 139 141  K 3.7 4.1  CL 108 109  CO2 22 24  GLUCOSE 165* 124*  BUN 23* 18  CREATININE 1.50* 1.20*  CALCIUM 9.0 9.3   Liver Function Tests: No results for input(s): AST, ALT, ALKPHOS, BILITOT, PROT, ALBUMIN in the last 72 hours. No results for input(s): LIPASE, AMYLASE in the last 72 hours. CBC:  Recent Labs  10/18/15 1710  WBC 10.1  HGB 10.4*  HCT 31.2*  MCV 82.8  PLT 312   Cardiac  Enzymes:  Recent Labs  10/18/15 2337 10/19/15 0600 10/19/15 1100  TROPONINI <0.03 <0.03 <0.03   BNP: No results for input(s): PROBNP in the last 72 hours. D-Dimer: No results for input(s): DDIMER in the last 72 hours. Hemoglobin A1C:  Recent Labs  10/18/15 2337  HGBA1C 6.6*   Fasting Lipid Panel: No results for input(s): CHOL, HDL, LDLCALC, TRIG, CHOLHDL, LDLDIRECT in the last 72 hours. Thyroid Function Tests: No results for input(s): TSH, T4TOTAL, T3FREE, THYROIDAB in the last 72 hours.  Invalid input(s): FREET3   Radiology/Studies:  Dg Chest 2 View  Result Date: 10/18/2015 CLINICAL DATA:  Chest pain for 1 day EXAM: CHEST  2 VIEW COMPARISON:  09/06/2015 FINDINGS: Hazy ill-defined opacities in the left mid and lower lung zones. Right lung is clear. Normal heart size. No pneumothorax or pleural effusion. IMPRESSION: Patchy left lung airspace opacity. Followup PA and lateral chest X-ray  is recommended in 3-4 weeks following trial of antibiotic therapy to ensure resolution and exclude underlying malignancy. Electronically Signed   By: Marybelle Killings M.D.   On: 10/18/2015 15:02   Ct Chest Wo Contrast  Result Date: 10/18/2015 CLINICAL DATA:  75 year old female with shortness of Breath since this morning. Abnormal left lung opacity on radiographs today. Initial encounter. EXAM: CT CHEST WITHOUT CONTRAST TECHNIQUE: Multidetector CT imaging of the chest was performed following the standard protocol without IV contrast. COMPARISON:  Chest radiographs 1459 hours today. Chest CT 02/14/2015. FINDINGS: Cardiovascular: No pericardial effusion. Calcified coronary artery atherosclerosis. Mild calcified aortic atherosclerosis. Mediastinum/Nodes: No lymphadenopathy. Lungs/Pleura: Curvilinear opacity about the left hilum involving the lingula and lateral basal segment of the lower lobe appears to correspond to the radiographic finding today and most resembles progressive scarring or atelectasis. Left  lower lobe bronchiectasis is more apparent than in February (series 205, image 87). Other left perihilar and upper lobe bronchiectasis also noted. There is a tiny left anterior upper lobe subpleural nodule on series 205, image 55 which is new and appears inflammatory. Major airways are patent. Chronic right lower lobe bronchiectasis and volume loss in the medial basal segment appears stable. Mild scarring or atelectasis in the right costophrenic angle and superior segment right lower lobe. Previously seen subtle 2-3 mm right upper lobe nodule has resolved and was inflammatory. Mild right upper lobe bronchiectasis. No pleural effusion. Upper Abdomen: Stable and negative visualized noncontrast liver, gallbladder, spleen, pancreas, adrenal glands, and kidneys. Negative visualized bowel. Musculoskeletal: Heterogeneous bone mineralization throughout the spine appears stable. No acute osseous abnormality identified. IMPRESSION: 1. Bronchiectasis. Mild increased scarring or atelectasis in the left lung responsible for the patchy opacity on radiographs today. 2. There is a tiny left upper lobe subpleural nodule which is new since February and is likely inflammatory. The 2-3 mm right lung nodule seen in February has resolved. No other acute lung finding. No follow-up needed if patient is low-risk. Non-contrast chest CT can be considered in 12 months if patient is high-risk. This recommendation follows the consensus statement: Guidelines for Management of Incidental Pulmonary Nodules Detected on CT Images: From the Fleischner Society 2017; Radiology 2017; 284:228-243. 3. Calcified coronary artery and to a lesser extent aortic atherosclerosis. Electronically Signed   By: Genevie Ann M.D.   On: 10/18/2015 17:39    PHYSICAL EXAM General: Well developed, overweight, in no acute distress. Head: Normocephalic, atraumatic, sclera non-icteric, oropharynx is clear Neck: Negative for carotid bruits. JVD not elevated. No  adenopathy Lungs: Clear bilaterally to auscultation without wheezes, rales, or rhonchi. Breathing is unlabored. Heart: RRR S1 S2 with 2/6 SEM Abdomen: Soft, non-tender, non-distended with normoactive bowel sounds. No hepatomegaly. No rebound/guarding. No obvious abdominal masses. Msk:  Strength and tone appears normal for age. Extremities: No clubbing, cyanosis or edema.  Distal pedal pulses are 2+ and equal bilaterally. Neuro: Alert and oriented X 3. Moves all extremities spontaneously. Psych:  Responds to questions appropriately with a normal affect.  ASSESSMENT AND PLAN: 1. Chest Pain: mixed typical + atypical features. She has ruled out for MI and is currently CP free. EKG shows sinus bradycardia but otherwise normal w/o ischemia. She has multiple risk factors for heart disease, including IDDM, HTN, and RA. She also has CKD. Chest CT this admit also showed calcified coronary artery atherosclerosis. She did have a normal cath in 2015. However given her symptoms and risk factors, recommend NST to reassess for ischemia. Stress test was recommended in consult yesterday but  order was not entered. She has already eaten today so we will order for tomorrow am. Leane Call.  2. Aortic Stenosis/ Insufficiency: Mild on Echo  3. IDDM: per IM.  4. CKD:SCr improving from 1.7>>1.5. Continue to monitor.   5. HTN: controlled on current regimen.   6. ZOX:[id number] statin therapy with Lipitor.   Present on Admission: . Chest pain . Chronic congestive heart failure with left ventricular diastolic dysfunction ([place]) . HTN (hypertension) . Hypothyroidism . Chronic pain . Rheumatoid arthritis ([place]) . AKI (acute kidney injury) ([place]) . CKD (chronic kidney disease) stage 3, GFR 30-59 ml/min . Lung nodule seen on imaging study   Signed, Gearald Stonebraker [place], [place] 10/20/2015 1:22 PM

## 2015-10-21 ENCOUNTER — Observation Stay (HOSPITAL_BASED_OUTPATIENT_CLINIC_OR_DEPARTMENT_OTHER): Payer: Medicare Other

## 2015-10-21 ENCOUNTER — Observation Stay (HOSPITAL_COMMUNITY): Payer: Medicare Other

## 2015-10-21 DIAGNOSIS — I5032 Chronic diastolic (congestive) heart failure: Secondary | ICD-10-CM | POA: Diagnosis not present

## 2015-10-21 DIAGNOSIS — N179 Acute kidney failure, unspecified: Secondary | ICD-10-CM | POA: Diagnosis not present

## 2015-10-21 DIAGNOSIS — R079 Chest pain, unspecified: Secondary | ICD-10-CM

## 2015-10-21 DIAGNOSIS — I350 Nonrheumatic aortic (valve) stenosis: Secondary | ICD-10-CM | POA: Diagnosis not present

## 2015-10-21 DIAGNOSIS — R072 Precordial pain: Secondary | ICD-10-CM | POA: Diagnosis not present

## 2015-10-21 DIAGNOSIS — I10 Essential (primary) hypertension: Secondary | ICD-10-CM | POA: Diagnosis not present

## 2015-10-21 DIAGNOSIS — R0789 Other chest pain: Secondary | ICD-10-CM | POA: Diagnosis not present

## 2015-10-21 LAB — NM MYOCAR MULTI W/SPECT W/WALL MOTION / EF
CHL CUP MPHR: 144 {beats}/min
CHL CUP RESTING HR STRESS: 50 {beats}/min
CSEPED: 5 min
CSEPEW: 1 METS
Peak HR: 77 {beats}/min
Percent HR: 53 %

## 2015-10-21 LAB — GLUCOSE, CAPILLARY
GLUCOSE-CAPILLARY: 123 mg/dL — AB (ref 65–99)
GLUCOSE-CAPILLARY: 112 mg/dL — AB (ref 65–99)

## 2015-10-21 LAB — BASIC METABOLIC PANEL
Anion gap: 8 (ref 5–15)
BUN: 20 mg/dL (ref 6–20)
CHLORIDE: 108 mmol/L (ref 101–111)
CO2: 24 mmol/L (ref 22–32)
Calcium: 9.3 mg/dL (ref 8.9–10.3)
Creatinine, Ser: 1.16 mg/dL — ABNORMAL HIGH (ref 0.44–1.00)
GFR calc non Af Amer: 45 mL/min — ABNORMAL LOW (ref >60)
GFR, EST AFRICAN AMERICAN: 52 mL/min — AB (ref >60)
Glucose, Bld: 131 mg/dL — ABNORMAL HIGH (ref 65–99)
POTASSIUM: 4.2 mmol/L (ref 3.5–5.1)
SODIUM: 140 mmol/L (ref 135–145)

## 2015-10-21 MED ORDER — TECHNETIUM TC 99M TETROFOSMIN IV KIT
30.0000 | PACK | Freq: Once | INTRAVENOUS | Status: AC | PRN
  Administered 2015-10-21: 30 via INTRAVENOUS

## 2015-10-21 MED ORDER — ASPIRIN 81 MG PO TBEC: 81.0000 mg | DELAYED_RELEASE_TABLET | Freq: Every day | ORAL | 0 refills | Status: DC

## 2015-10-21 MED ORDER — TECHNETIUM TC 99M TETROFOSMIN IV KIT
10.0000 | PACK | Freq: Once | INTRAVENOUS | Status: AC | PRN
  Administered 2015-10-21: 10 via INTRAVENOUS

## 2015-10-21 MED ORDER — REGADENOSON 0.4 MG/5ML IV SOLN
0.4000 mg | Freq: Once | INTRAVENOUS | Status: AC
  Administered 2015-10-21: 0.4 mg via INTRAVENOUS
  Filled 2015-10-21: qty 5

## 2015-10-21 MED ORDER — REGADENOSON 0.4 MG/5ML IV SOLN
INTRAVENOUS | Status: AC
  Filled 2015-10-21: qty 5

## 2015-10-21 NOTE — Progress Notes (Signed)
    Lexiscan Myoview: 10/21/15  IMPRESSION: 1. Small fixed perfusion defect at the septal wall towards the apex. No reversible perfusion defect.  2. Normal left ventricular wall motion.  3. Left ventricular ejection fraction 53%  4. Non invasive risk stratification*: Low  *2012 Appropriate Use Criteria for Coronary Revascularization Focused Update: J Am Coll Cardiol. 2012;59(9):857-881. Http://[email or web address].[email or web address]   I have called and updated the patient regarding these results.   Reino Bellis NP-c

## 2015-10-21 NOTE — Discharge Summary (Signed)
Physician Discharge Summary  [name] ERX:[id number] DOB: 08-Jul-1940 DOA: 10/18/2015  PCP: Lilian Coma, MD  Admit date: 10/18/2015 Discharge date: 10/21/2015  Admitted From: Home Disposition:  Home  Recommendations for Outpatient Follow-up:  1. Follow up with PCP in 1-2 weeks 2. Follow up with Cardiology in 2 weeks  3. Please obtain BMP in one week  4. Please follow up with PCP regarding incidental lung nodule found on CT  Home Health: No  Equipment/Devices: None   Discharge Condition: Stable CODE STATUS: Full  Diet recommendation: Cardiac/carb modified   Brief/Interim Summary: Julia O Bemburyis a 75 y.o.femalewith medical history significant for chronic kidney disease stage III, insulin-dependent diabetes mellitus, hypertension, hypothyroidism, rheumatoid arthritis, and chronic diastolic CHF who presents to the emergency department with complaints of chest pain. Patient reports that she awoke in her usual state health, but went on to develop a sharp stabbing, moderate in intensity chest pain, with radiation to the left arm, neck, and back. Pain developed while at rest and was associated with dyspnea, lightheadedness, and diaphoresis. Patient reports having similar symptoms once before and was hospitalized with negative cardiac workup. Patient activated EMS for transport to the hospital. Patient received 324 mg aspirin and one sublingual nitroglycerin en route to the hospital with relief of chest pain.   During admission, patient was evaluated by cardiology. She underwent echocardiogram as well as serial monitoring of her troponin. These are unremarkable. She underwent Lexiscan stress test which was negative. On day of discharge, she was chest pain-free and doing well. She should follow-up with primary care physician as well as cardiology. She also had an incidental lung nodule that was seen on CT. She needs to follow-up with PCP.  Discharge Diagnoses:  Principal  Problem:   Chest pain Active Problems:   Aortic stenosis   HTN (hypertension)   Chronic congestive heart failure with left ventricular diastolic dysfunction (HCC)   Hypothyroidism   Chronic pain   Diabetes mellitus, type II, insulin dependent (HCC)   Rheumatoid arthritis (HCC)   AKI (acute kidney injury) (HCC)   CKD (chronic kidney disease) stage 3, GFR 30-59 ml/min   Lung nodule seen on imaging study   Chest pain  - Describes pain as left-sided, sharp, stabbing, but not reproducible, with radiation to left upper abdomen, was associated with shortness of breath, diaphoresis, lightheadedness. Chest pain improved with nitroglycerin. Also has some chest wall pain that is tender to palpation also left ribs, but states this is a different pain than the initial chest pain from prior to admission.  - Cath on 08/08/2013: normal coronary anatomy - Initial EKG with sinus bradycardia, but no ST-T changes    - Serial troponin negative - Continue Aspirin, Lipitor, continue Lopressor as HR allows  - Echo: EF 95-09%, normal diastolic function, normal wall motion.  - Cardiology following - Lexiscan stress 10/15: Small fixed perfusion defect at the septal wall towards the apex. No reversible perfusion defect. Normal left ventricular wall motion. - Follow up with cardiology   AKI superimposed on CKD stage III  - Baseline Cr 1.2  - Resolved with IVF  - Possibly dehydration and prerenal azotemia from newly prescribed lasix  - Hold lasix at time of discharge  - ARB restarted at time of discharge, should have repeat BMP in 1 week   Chronic diastolic CHF and Aortic stenosis  - TTE (08/05/13): EF 65-70%, mild AS, mild MR, and mild LAE  - Echo (10/19/15): EF 32-67%, normal diastolic function, normal wall motion.  -  Hold Lasix given AKI and apparent dehydration  - Follow daily wts and strict I/O's    Insulin-dependent Dm, well controlled  - Ha1c 6.6 - Managed with Novolin 70/30 at home, 6-8 units  TID per sliding-scale; will hold this while here  - Start with Lantus 5 units qHS and a low-intensity SSI  --> back to home regimen at time of discharge   Hypertension  - Continue Lopressor as HR allows  - Hold losartan until renal fxn stabilizes --> restarted at discharge  Hypothyroidism  - Appears to be stable, continue Synthroid   Rheumatoid arthritis with chronic pain  - Stable, followed by rheumatology with abatacept infusions - Continue home-regimen of prn Norco   Sinus bradycardia - HR in mid-50's; pt is asymptomatic on admission  - Likely secondary to Lopressor; ruling-out ACS as above  - Continue Lopressor with holding parameters  Incidental lung nodule on CT - Tiny LUL subpleural nodule noted on CT  - Discussed with patient who will follow-up with PCP for follow-up as deemed appropriate    Discharge Instructions  Discharge Instructions    Diet - low sodium heart healthy    Complete by:  As directed    Discharge instructions    Complete by:  As directed    Follow up with PCP in 1-2 weeks Follow up with Cardiology in 2 weeks  Please obtain BMP in one week  Please follow up with PCP regarding incidental lung nodule found on CT   Increase activity slowly    Complete by:  As directed        Medication List    STOP taking these medications   LASIX PO     TAKE these medications   acetaminophen 500 MG tablet Commonly known as:  TYLENOL Take 1,000 mg by mouth every 6 (six) hours as needed for mild pain.   aspirin 81 MG EC tablet Take 1 tablet (81 mg total) by mouth daily. Start taking on:  10/22/2015   atorvastatin 40 MG tablet Commonly known as:  LIPITOR Take 40 mg by mouth daily at 6 PM.   cholecalciferol 1000 units tablet Commonly known as:  VITAMIN D Take 1,000 Units by mouth daily.   cyclobenzaprine 10 MG tablet Commonly known as:  FLEXERIL Take 10 mg by mouth 3 (three) times daily as needed for muscle spasms.   diphenhydrAMINE 25 MG  tablet Commonly known as:  BENADRYL Take 50 mg by mouth See admin instructions. One hour prior to O[facility] infusion   FEOSOL 325 (65 FE) MG tablet Generic drug:  ferrous sulfate Take 325-650 mg by mouth daily with breakfast.   folic acid 1 MG tablet Commonly known as:  FOLVITE Take 1 mg by mouth daily.   HYDROcodone-acetaminophen 5-325 MG tablet Commonly known as:  NORCO/VICODIN Take 1-2 tablets by mouth every 3 (three) hours as needed for moderate pain.   insulin NPH-regular Human (70-30) 100 UNIT/ML injection Commonly known as:  NOVOLIN 70/30 Inject 6-8 Units into the skin 3 (three) times daily with meals. Sliding source   levothyroxine 137 MCG tablet Commonly known as:  SYNTHROID, LEVOTHROID Take 137 mcg by mouth daily before breakfast.   losartan 50 MG tablet Commonly known as:  COZAAR Take 50 mg by mouth every evening.   metoprolol tartrate 25 MG tablet Commonly known as:  LOPRESSOR Take 1 tablet (25 mg total) by mouth 2 (two) times daily as needed. What changed:  when to take this   multivitamin per tablet Take 1  tablet by mouth daily.   ORENCIA 250 MG injection Generic drug:  abatacept Inject 750 mg into the vein. Infuse 750 mg / 3 vials over 30 minutes every 4 weeks   pregabalin 75 MG capsule Commonly known as:  LYRICA Take 75 mg by mouth 2 (two) times daily.      Follow-up Information    Lilian Coma, MD. Schedule an appointment as soon as possible for a visit in 1 week(s).   Specialty:  Family Medicine Contact information: [address] [place] [address] [phone]        Candee Furbish, MD. Schedule an appointment as soon as possible for a visit in 2 week(s).   Specialty:  Cardiology Contact information: [address] [place] [address] [phone]          Allergies  Allergen Reactions  . Methotrexate Derivatives Other (See Comments)    Elevated creat. Levels  . Other Other (See  Comments)    NO Blood products.  [name]- causes low blood sugar (pt states she was taking medication but cant remember what it was for.)  . Penicillins Rash    Has patient had a PCN reaction causing immediate rash, facial/tongue/throat swelling, SOB or lightheadedness with hypotension: Yes Has patient had a PCN reaction causing severe rash involving mucus membranes or skin necrosis: Yes  Has patient had a PCN reaction that required hospitalization No Has patient had a PCN reaction occurring within the last 10 years: No If all of the above answers are "NO", then may proceed with Cephalosporin use.   . Enbrel [Etanercept] Other (See Comments)    Inadequate response    . Remicade [Infliximab] Other (See Comments)    Inadequate response   . Zocor [Simvastatin] Other (See Comments)    Leg cramps     Consultations:  Cardiology    Procedures/Studies: Dg Chest 2 View  Result Date: 10/18/2015 CLINICAL DATA:  Chest pain for 1 day EXAM: CHEST  2 VIEW COMPARISON:  09/06/2015 FINDINGS: Hazy ill-defined opacities in the left mid and lower lung zones. Right lung is clear. Normal heart size. No pneumothorax or pleural effusion. IMPRESSION: Patchy left lung airspace opacity. Followup PA and lateral chest X-ray is recommended in 3-4 weeks following trial of antibiotic therapy to ensure resolution and exclude underlying malignancy. Electronically Signed   By: Marybelle Killings M.D.   On: 10/18/2015 15:02   Ct Chest Wo Contrast  Result Date: 10/18/2015 CLINICAL DATA:  75 year old female with shortness of Breath since this morning. Abnormal left lung opacity on radiographs today. Initial encounter. EXAM: CT CHEST WITHOUT CONTRAST TECHNIQUE: Multidetector CT imaging of the chest was performed following the standard protocol without IV contrast. COMPARISON:  Chest radiographs 1459 hours today. Chest CT 02/14/2015. FINDINGS: Cardiovascular: No pericardial effusion. Calcified coronary artery  atherosclerosis. Mild calcified aortic atherosclerosis. Mediastinum/Nodes: No lymphadenopathy. Lungs/Pleura: Curvilinear opacity about the left hilum involving the lingula and lateral basal segment of the lower lobe appears to correspond to the radiographic finding today and most resembles progressive scarring or atelectasis. Left lower lobe bronchiectasis is more apparent than in February (series 205, image 87). Other left perihilar and upper lobe bronchiectasis also noted. There is a tiny left anterior upper lobe subpleural nodule on series 205, image 55 which is new and appears inflammatory. Major airways are patent. Chronic right lower lobe bronchiectasis and volume loss in the medial basal segment appears stable. Mild scarring or atelectasis in the right costophrenic angle and superior  segment right lower lobe. Previously seen subtle 2-3 mm right upper lobe nodule has resolved and was inflammatory. Mild right upper lobe bronchiectasis. No pleural effusion. Upper Abdomen: Stable and negative visualized noncontrast liver, gallbladder, spleen, pancreas, adrenal glands, and kidneys. Negative visualized bowel. Musculoskeletal: Heterogeneous bone mineralization throughout the spine appears stable. No acute osseous abnormality identified. IMPRESSION: 1. Bronchiectasis. Mild increased scarring or atelectasis in the left lung responsible for the patchy opacity on radiographs today. 2. There is a tiny left upper lobe subpleural nodule which is new since February and is likely inflammatory. The 2-3 mm right lung nodule seen in February has resolved. No other acute lung finding. No follow-up needed if patient is low-risk. Non-contrast chest CT can be considered in 12 months if patient is high-risk. This recommendation follows the consensus statement: Guidelines for Management of Incidental Pulmonary Nodules Detected on CT Images: From the Fleischner Society 2017; Radiology 2017; 284:228-243. 3. Calcified coronary artery  and to a lesser extent aortic atherosclerosis. Electronically Signed   By: Genevie Ann M.D.   On: 10/18/2015 17:39   Nm Myocar Multi W/spect W/wall Motion / Ef  Result Date: 10/21/2015 CLINICAL DATA:  75 year old female with a history of chest pain. Cardiovascular risk factors include smoking, diabetes, hypertension, family history EXAM: MYOCARDIAL IMAGING WITH SPECT (REST AND PHARMACOLOGIC-STRESS) GATED LEFT VENTRICULAR WALL MOTION STUDY LEFT VENTRICULAR EJECTION FRACTION TECHNIQUE: Standard myocardial SPECT imaging was performed after resting intravenous injection of 10 mCi Tc-60m tetrofosmin. Subsequently, intravenous infusion of Lexiscan was performed under the supervision of the Cardiology staff. At peak effect of the drug, 30 mCi Tc-30m tetrofosmin was injected intravenously and standard myocardial SPECT imaging was performed. Quantitative gated imaging was also performed to evaluate left ventricular wall motion, and estimate left ventricular ejection fraction. COMPARISON:  None. FINDINGS: Perfusion: Small fixed perfusion defect at the septal wall towards the apex. No reversible perfusion defect identified. Wall Motion: Normal left ventricular wall motion. No left ventricular dilation. Left Ventricular Ejection Fraction: 53 % End diastolic volume 294 ml End systolic volume 50 ml IMPRESSION: 1. Small fixed perfusion defect at the septal wall towards the apex. No reversible perfusion defect. 2. Normal left ventricular wall motion. 3. Left ventricular ejection fraction 53% 4. Non invasive risk stratification*: Low *2012 Appropriate Use Criteria for Coronary Revascularization Focused Update: J Am Coll Cardiol. 7654;65(0):354-656. http://content.airportbarriers.com.aspx?articleid=[email or web address] Electronically Signed   By: Corrie Mckusick D.O.   On: 10/21/2015 14:53       Subjective: Doing well this afternoon. No further episodes of chest pain. No SOB, N/V. Tolerating meal and visiting with family. Ready for  discharge.   Discharge Exam: Vitals:   10/21/15 0954 10/21/15 1409  BP: (!) 141/56 (!) 111/51  Pulse: 70 (!) 49  Resp: 20 18  Temp:  97.2 F (36.2 C)   Vitals:   10/21/15 0923 10/21/15 0952 10/21/15 0954 10/21/15 1409  BP: 138/64 (!) 137/55 (!) 141/56 (!) 111/51  Pulse: (!) 54 76 70 (!) 49  Resp: 20  20 18   Temp:    97.2 F (36.2 C)  TempSrc:    Oral  SpO2:    99%  Weight:      Height:        General exam: Appears calm and comfortable  Respiratory system: Clear to auscultation. Respiratory effort normal. Cardiovascular system: S1 & S2 heard, RRR. No JVD, +systolic murmur 3/6. No pedal edema. Gastrointestinal system: Abdomen is nondistended, soft and mildly tender on LUQ. No organomegaly or masses felt. Normal bowel sounds  heard. Central nervous system: Alert and oriented. No focal neurological deficits. Extremities: Symmetric 5 x 5 power. Skin: No rashes, lesions or ulcers Psychiatry: Judgement and insight appear normal. Mood & affect appropriate.      The results of significant diagnostics from this hospitalization (including imaging, microbiology, ancillary and laboratory) are listed below for reference.     Microbiology: No results found for this or any previous visit (from the past 240 hour(s)).   Labs: BNP (last 3 results)  Recent Labs  10/18/15 2337  BNP 62.0   Basic Metabolic Panel:  Recent Labs Lab 10/18/15 1710 10/19/15 0600 10/20/15 0532 10/21/15 0356  NA 138 139 141 140  K 3.7 3.7 4.1 4.2  CL 107 108 109 108  CO2 24 22 24 24   GLUCOSE 96 165* 124* 131*  BUN 25* 23* 18 20  CREATININE 1.78* 1.50* 1.20* 1.16*  CALCIUM 9.2 9.0 9.3 9.3   Liver Function Tests: No results for input(s): AST, ALT, ALKPHOS, BILITOT, PROT, ALBUMIN in the last 168 hours. No results for input(s): LIPASE, AMYLASE in the last 168 hours. No results for input(s): AMMONIA in the last 168 hours. CBC:  Recent Labs Lab 10/18/15 1710  WBC 10.1  HGB 10.4*  HCT 31.2*   MCV 82.8  PLT 312   Cardiac Enzymes:  Recent Labs Lab 10/18/15 2337 10/19/15 0600 10/19/15 1100  TROPONINI <0.03 <0.03 <0.03   BNP: Invalid input(s): POCBNP CBG:  Recent Labs Lab 10/20/15 1122 10/20/15 1629 10/20/15 2035 10/21/15 0608 10/21/15 1139  GLUCAP 153* 116* 166* 123* 112*   D-Dimer No results for input(s): DDIMER in the last 72 hours. Hgb A1c  Recent Labs  10/18/15 2337  HGBA1C 6.6*   Lipid Profile No results for input(s): CHOL, HDL, LDLCALC, TRIG, CHOLHDL, LDLDIRECT in the last 72 hours. Thyroid function studies No results for input(s): TSH, T4TOTAL, T3FREE, THYROIDAB in the last 72 hours.  Invalid input(s): FREET3 Anemia work up No results for input(s): VITAMINB12, FOLATE, FERRITIN, TIBC, IRON, RETICCTPCT in the last 72 hours. Urinalysis    Component Value Date/Time   COLORURINE YELLOW 09/12/2015 1025   APPEARANCEUR CLOUDY (A) 09/12/2015 1025   LABSPEC 1.023 09/12/2015 1025   LABSPEC 1.030 01/10/2010 1424   PHURINE 5.0 09/12/2015 1025   GLUCOSEU NEGATIVE 09/12/2015 1025   HGBUR NEGATIVE 09/12/2015 1025   BILIRUBINUR NEGATIVE 09/12/2015 1025   BILIRUBINUR not done 01/10/2010 [place] 09/12/2015 1025   PROTEINUR NEGATIVE 09/12/2015 1025   UROBILINOGEN 0.2 09/20/2014 1643   NITRITE NEGATIVE 09/12/2015 1025   LEUKOCYTESUR SMALL (A) 09/12/2015 1025   LEUKOCYTESUR Large 01/10/2010 1424   Sepsis Labs Invalid input(s): PROCALCITONIN,  WBC,  LACTICIDVEN Microbiology No results found for this or any previous visit (from the past 240 hour(s)).   Time coordinating discharge: Over 30 minutes  SIGNED:  Dessa Phi, DO [facility] Pager [phone]  If 7PM-7AM, please contact night-coverage [email or web address] Password [email or web address] 10/21/2015, 3:52 PM

## 2015-10-21 NOTE — Discharge Summary (Addendum)
Pt got discharged to home, discharge instructions provided and patient showed understanding to it, IV taken out,Telemonitor DC,pt left unit in wheelchair with all of the belongings accompanied with a family member (husband)

## 2015-10-21 NOTE — Progress Notes (Signed)
TELEMETRY: Reviewed telemetry pt in NSR: Vitals:   10/20/15 2002 10/20/15 2200 10/21/15 0416 10/21/15 0923  BP: 118/82  (!) 142/42 138/64  Pulse: (!) 44 (!) 52 (!) 54 (!) 54  Resp: 18  20 20   Temp: 98 F (36.7 C)  98.4 F (36.9 C)   TempSrc: Oral  Oral   SpO2: 92%  99%   Weight:   196 lb 3.4 oz (89 kg)   Height:        Intake/Output Summary (Last 24 hours) at 10/21/15 0951 Last data filed at 10/21/15 0600  Gross per 24 hour  Intake              700 ml  Output              941 ml  Net             -241 ml   Filed Weights   10/19/15 0639 10/20/15 0511 10/21/15 0416  Weight: 189 lb 4.8 oz (85.9 kg) 193 lb 14.4 oz (88 kg) 196 lb 3.4 oz (89 kg)    Subjective  No chest pain today.   Marland Kitchen aspirin EC  81 mg Oral Daily  . atorvastatin  40 mg Oral q1800  . cholecalciferol  1,000 Units Oral Daily  . ferrous sulfate  325 mg Oral Q breakfast  . folic acid  1 mg Oral Daily  . heparin  5,000 Units Subcutaneous Q8H  . insulin aspart  0-5 Units Subcutaneous QHS  . insulin aspart  0-9 Units Subcutaneous TID WC  . insulin glargine  5 Units Subcutaneous QHS  . levothyroxine  137 mcg Oral QAC breakfast  . metoprolol tartrate  25 mg Oral Daily  . multivitamin with minerals  1 tablet Oral Daily  . pregabalin  75 mg Oral BID  . regadenoson      . regadenoson  0.4 mg Intravenous Once  . sodium chloride flush  3 mL Intravenous Q12H      LABS: Basic Metabolic Panel:  Recent Labs  10/20/15 0532 10/21/15 0356  NA 141 140  K 4.1 4.2  CL 109 108  CO2 24 24  GLUCOSE 124* 131*  BUN 18 20  CREATININE 1.20* 1.16*  CALCIUM 9.3 9.3   Liver Function Tests: No results for input(s): AST, ALT, ALKPHOS, BILITOT, PROT, ALBUMIN in the last 72 hours. No results for input(s): LIPASE, AMYLASE in the last 72 hours. CBC:  Recent Labs  10/18/15 1710  WBC 10.1  HGB 10.4*  HCT 31.2*  MCV 82.8  PLT 312   Cardiac Enzymes:  Recent Labs  10/18/15 2337 10/19/15 0600 10/19/15 1100    TROPONINI <0.03 <0.03 <0.03   BNP: No results for input(s): PROBNP in the last 72 hours. D-Dimer: No results for input(s): DDIMER in the last 72 hours. Hemoglobin A1C:  Recent Labs  10/18/15 2337  HGBA1C 6.6*   Fasting Lipid Panel: No results for input(s): CHOL, HDL, LDLCALC, TRIG, CHOLHDL, LDLDIRECT in the last 72 hours. Thyroid Function Tests: No results for input(s): TSH, T4TOTAL, T3FREE, THYROIDAB in the last 72 hours.  Invalid input(s): FREET3   Radiology/Studies:  No results found.  PHYSICAL EXAM General: Well developed, overweight, in no acute distress. Head: Normocephalic, atraumatic, sclera non-icteric, oropharynx is clear Neck: Negative for carotid bruits. JVD not elevated. No adenopathy Lungs: Clear bilaterally to auscultation without wheezes, rales, or rhonchi. Breathing is unlabored. Heart: RRR S1 S2 with 2/6 SEM Abdomen: Soft, non-tender, non-distended with normoactive bowel sounds. No  hepatomegaly. No rebound/guarding. No obvious abdominal masses. Msk:  Strength and tone appears normal for age. Extremities: No clubbing, cyanosis or edema.  Distal pedal pulses are 2+ and equal bilaterally. Neuro: Alert and oriented X 3. Moves all extremities spontaneously. Psych:  Responds to questions appropriately with a normal affect.  ASSESSMENT AND PLAN: 1. Chest Pain: mixed typical + atypical features. She has ruled out for MI and is currently CP free. EKG shows sinus bradycardia but otherwise normal w/o ischemia. She has multiple risk factors for heart disease, including IDDM, HTN, and RA. She also has CKD. Chest CT this admit also showed calcified coronary artery atherosclerosis. She did have a normal cath in 2015. However given her symptoms and risk factors, recommend NST to reassess for ischemia. --Seen in [facility] for stress test, images pending.  2. Aortic Stenosis/ Insufficiency: Mild on Echo  3. IDDM: per IM.  4. CKD:SCr improving from 1.7>>1.5>>1.16  Continue to monitor.   5. HTN: controlled on current regimen.   6. KAJ:GOTLXBWI statin therapy with Lipitor.   Present on Admission: . Chest pain . Chronic congestive heart failure with left ventricular diastolic dysfunction ([place]) . HTN (hypertension) . Hypothyroidism . Chronic pain . Rheumatoid arthritis ([place]) . AKI (acute kidney injury) ([place]) . CKD (chronic kidney disease) stage 3, GFR 30-59 ml/min . Lung nodule seen on imaging study   Signed, Reino Bellis, NP 10/21/2015 9:51 AM   I have seen, examined the patient, and reviewed the above assessment and plan.  Changes to above are made where necessary.  On exam, comfortable, RRR.  Proceed with myoview.  If low risk, no further CV testing.  If high risk then further testing may be required.  Co Sign: Thompson Grayer, MD 10/21/2015 10:26 AM

## 2015-10-23 DIAGNOSIS — J479 Bronchiectasis, uncomplicated: Secondary | ICD-10-CM | POA: Diagnosis not present

## 2015-10-23 DIAGNOSIS — M0570 Rheumatoid arthritis with rheumatoid factor of unspecified site without organ or systems involvement: Secondary | ICD-10-CM | POA: Diagnosis not present

## 2015-10-23 DIAGNOSIS — M109 Gout, unspecified: Secondary | ICD-10-CM | POA: Diagnosis not present

## 2015-10-23 DIAGNOSIS — M4810 Ankylosing hyperostosis [Forestier], site unspecified: Secondary | ICD-10-CM | POA: Diagnosis not present

## 2015-10-23 DIAGNOSIS — N183 Chronic kidney disease, stage 3 (moderate): Secondary | ICD-10-CM | POA: Diagnosis not present

## 2015-10-25 ENCOUNTER — Encounter: Payer: Self-pay | Admitting: Cardiology

## 2015-10-29 DIAGNOSIS — M109 Gout, unspecified: Secondary | ICD-10-CM | POA: Insufficient documentation

## 2015-10-29 DIAGNOSIS — Z96652 Presence of left artificial knee joint: Secondary | ICD-10-CM | POA: Insufficient documentation

## 2015-10-29 DIAGNOSIS — R7612 Nonspecific reaction to cell mediated immunity measurement of gamma interferon antigen response without active tuberculosis: Secondary | ICD-10-CM | POA: Insufficient documentation

## 2015-10-29 DIAGNOSIS — Z79899 Other long term (current) drug therapy: Secondary | ICD-10-CM | POA: Insufficient documentation

## 2015-10-29 NOTE — Assessment & Plan Note (Signed)
Treated with INH. Requires yearly CXR.

## 2015-10-30 ENCOUNTER — Encounter: Payer: Self-pay | Admitting: Rheumatology

## 2015-10-30 ENCOUNTER — Other Ambulatory Visit: Payer: Self-pay | Admitting: Radiology

## 2015-10-30 ENCOUNTER — Telehealth: Payer: Self-pay | Admitting: Internal Medicine

## 2015-10-30 ENCOUNTER — Ambulatory Visit (INDEPENDENT_AMBULATORY_CARE_PROVIDER_SITE_OTHER): Payer: Medicare Other | Admitting: Rheumatology

## 2015-10-30 ENCOUNTER — Telehealth: Payer: Self-pay | Admitting: Radiology

## 2015-10-30 VITALS — BP 163/63 | HR 62 | Resp 16 | Ht 64.0 in | Wt 201.0 lb

## 2015-10-30 DIAGNOSIS — M1A9XX0 Chronic gout, unspecified, without tophus (tophi): Secondary | ICD-10-CM | POA: Diagnosis not present

## 2015-10-30 DIAGNOSIS — R7611 Nonspecific reaction to tuberculin skin test without active tuberculosis: Secondary | ICD-10-CM

## 2015-10-30 DIAGNOSIS — Z9289 Personal history of other medical treatment: Secondary | ICD-10-CM

## 2015-10-30 DIAGNOSIS — M1A0790 Idiopathic chronic gout, unspecified ankle and foot, without tophus (tophi): Secondary | ICD-10-CM

## 2015-10-30 DIAGNOSIS — Z79899 Other long term (current) drug therapy: Secondary | ICD-10-CM | POA: Diagnosis not present

## 2015-10-30 DIAGNOSIS — Z96652 Presence of left artificial knee joint: Secondary | ICD-10-CM

## 2015-10-30 DIAGNOSIS — R911 Solitary pulmonary nodule: Secondary | ICD-10-CM

## 2015-10-30 DIAGNOSIS — J984 Other disorders of lung: Secondary | ICD-10-CM

## 2015-10-30 DIAGNOSIS — M069 Rheumatoid arthritis, unspecified: Secondary | ICD-10-CM

## 2015-10-30 MED ORDER — PREDNISONE 5 MG PO TABS: ORAL_TABLET | ORAL | 0 refills | Status: DC

## 2015-10-30 MED ORDER — PREGABALIN 75 MG PO CAPS: 75.0000 mg | ORAL_CAPSULE | Freq: Every day | ORAL | 0 refills | Status: DC

## 2015-10-30 NOTE — Telephone Encounter (Signed)
Routing message to [place] per her request.

## 2015-10-30 NOTE — Telephone Encounter (Signed)
I have discussed patient with Dr Estanislado Pandy, and she needs urgent appt with Dr Chase Caller. Dr Chase Caller has advised he can see patient on Friday. I have called Laverne at infusion center and advised her not to allow patient to infuse with O[facility] this week, until she has been evaluated by Dr Chase Caller. I will put an urgent referral in our work que as well.   This note is being forwarded to Dr Chase Caller per his request.

## 2015-10-30 NOTE — Patient Instructions (Signed)

## 2015-10-30 NOTE — Progress Notes (Signed)
*IMAGE* Office Visit Note  Patient: [name]             Date of Birth: [birth year]           MRN: [id number]             PCP: Lilian Coma, MD Referring: Jonathon Jordan, MD Visit Date: 10/30/2015    Subjective:  Follow-up   History of Present Illness: [name] is a 75 y.o. female she was hospitalized after the last visit in the office here for shortness of breath. She has follow-up appointment with the cardiologist the initial cardiology workup was negative. She had an infection in her gums therefore she stopped the Orencia infusions. She had a flare of her arthritis with increased joint pain and swelling in both hands. She also had pain in her neck and shoulders elbows, hip joints, knee joints and feet. She will be getting her next Orencia infusion this Friday.    Activities of Daily Living:  Patient reports morning stiffness for 30 minutes.   Patient Reports nocturnal pain.  Difficulty dressing/grooming: Reports Difficulty climbing stairs: Reports Difficulty getting out of chair: Reports Difficulty using hands for taps, buttons, cutlery, and/or writing: Reports   Review of Systems  Constitutional: Positive for fever, night sweats and weakness.  HENT: Positive for mouth dryness.   Eyes: Positive for dryness.  Respiratory: Positive for shortness of breath.   Cardiovascular: Positive for hypertension and irregular heartbeat.  Gastrointestinal: Negative.   Endocrine: Negative.   Genitourinary: Negative.   Musculoskeletal: Positive for arthralgias, joint pain, joint swelling, muscle weakness and morning stiffness.  Skin: Negative.   Allergic/Immunologic: Negative.   Neurological: Positive for dizziness and night sweats.  Hematological: Negative.   Psychiatric/Behavioral: Positive for sleep disturbance. The patient is nervous/anxious.     PMFS History:  Patient Active Problem List   Diagnosis Date Noted  . Chronic gout involving toe without tophus  10/30/2015  . High risk medication use 10/29/2015  . Gout 10/29/2015  . H/O total knee replacement, left 10/29/2015  . History of positive PPD 10/29/2015  . Diabetes mellitus, type II, insulin dependent ([place]) 10/18/2015  . Rheumatoid arthritis ([place]) 10/18/2015  . AKI (acute kidney injury) ([place]) 10/18/2015  . CKD (chronic kidney disease) stage 3, GFR 30-59 ml/min 10/18/2015  . Lung nodule seen on imaging study 10/18/2015  . Neuropathy ([place]) 08/22/2014  . Hypothyroidism 08/22/2014  . Chronic pain 08/22/2014  . Chronic congestive heart failure with left ventricular diastolic dysfunction ([place]) 08/08/2013  . Chest pain 08/05/2013  . Aortic stenosis 12/22/2012  . Hyperlipidemia 12/22/2012  . PVC (premature ventricular contraction) 12/22/2012  . HTN (hypertension) 12/22/2012  . Open angle with borderline findings, high risk 12/15/2011  . Cataract, nuclear 05/25/2011    Past Medical History:  Diagnosis Date  . Allergic rhinitis   . Anemia   . Aortic stenosis 12/22/2012   Mild 3/14-2.35 m/s peak velocity  . CHF (congestive heart failure) ([place])   . Chronic pain   . CKD (chronic kidney disease), stage III   . Diabetes mellitus   . Diverticulosis   . DVT (deep venous thrombosis) ([place])   . Glaucoma   . Hemorrhoids    internal  . HTN (hypertension)   . Hypercholesteremia   . Hyperlipidemia 12/22/2012  . Hypothyroidism   . Meningioma ([place]) 2008  . Osteoarthritis   . Peripheral neuropathy ([place])   . PPD positive    6 months ago  . PVC (premature ventricular contraction) 12/22/2012  .  Rheumatoid arthritis(714.0)   . Sickle cell trait ([place])   . 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 Hypotension Mother   . CVA Mother   . Diabetes Mother   . Hypertension Father   . Kidney failure Father    Past Surgical History:  Procedure Laterality Date  . ABDOMINAL HYSTERECTOMY  1978     TAH,& BSO 1  . fistula repair  03/2010   partial colectomy  . intracranial surgery  11/2007   removal of meningioma  . JOINT REPLACEMENT  2001   left TKR  . LEFT HEART CATHETERIZATION WITH CORONARY ANGIOGRAM N/A 08/08/2013   Procedure: LEFT HEART CATHETERIZATION WITH CORONARY ANGIOGRAM;  Surgeon: Peter M Martinique, MD;  Location: [facility] CATH LAB;  Service: Cardiovascular;  Laterality: N/A;  . ROTATOR CUFF REPAIR  1999   BIlateral  . SIGMOIDECTOMY  2012  . TONSILLECTOMY AND ADENOIDECTOMY    . TOTAL THYROIDECTOMY  1992  . wrist sx     right    Social History   Social History Narrative  . No narrative on file     Objective: Vital Signs: BP (!) 163/63 (BP Location: Left Arm, Patient Position: Sitting, Cuff Size: Large)   Pulse 62   Resp 16   Ht 5\' 4"  (1.626 m)   Wt 201 lb (91.2 kg)   BMI 34.50 kg/m    Physical Exam  Constitutional: She is oriented to person, place, and time. She appears well-developed and well-nourished.  HENT:  Head: Normocephalic and atraumatic.  Eyes: Conjunctivae and EOM are normal. Pupils are equal, round, and reactive to light.  Neck: Normal range of motion. Neck supple. No thyromegaly present.  Cardiovascular: Normal rate.   Murmur heard. Pulmonary/Chest: Effort normal and breath sounds normal.  Abdominal: Soft. Bowel sounds are normal.  Neurological: She is alert and oriented to person, place, and time.  Skin: Skin is warm and dry. Capillary refill takes more than 3 seconds.  Psychiatric: She has a normal mood and affect. Her behavior is normal.     Musculoskeletal Exam: C-spine good range of motion lumbar spine limited range of motion. Shoulder joints abduction limited to 70 on the left and 90 on the right. She had swelling and synovitis of bilateral MCP joints and PIP joints warmth and swelling in bilateral knee joints and ankle joints tenderness or MTP joints.  CDAI Exam: CDAI Homunculus Exam:   Tenderness:  RUE: glenohumeral, ulnohumeral and  radiohumeral and wrist LUE: glenohumeral, ulnohumeral and radiohumeral and wrist Right hand: 1st MCP, 2nd MCP, 3rd MCP, 4th MCP, 2nd PIP, 3rd PIP, 4th PIP and 5th PIP Left hand: 1st MCP, 2nd MCP, 3rd MCP, 4th MCP, 2nd PIP, 3rd PIP and 4th PIP RLE: tibiofemoral and tibiotalar LLE: tibiotalar Right foot: 3rd MTP and 4th MTP Left foot: 1st MTP and 2nd MTP  Swelling:  Right hand: 1st MCP, 2nd MCP and 3rd MCP Left hand: 1st MCP, 2nd MCP, 3rd MCP and 4th MCP RLE: tibiofemoral and tibiotalar LLE: tibiofemoral and tibiotalar Right foot: 1st MTP  Joint Counts:  CDAI Tender Joint count: 22 CDAI Swollen Joint count: 9  Global Assessments:  Patient Global Assessment: 9 Provider Global Assessment: 8  CDAI Calculated Score: 48    Investigation: Findings:  CBC on October 12 showed hemoglobin of 10.4, WBC count was normal. BMP showed GFR of 31. Labs from August  2017 showed comprehensive metabolic panel with GFR of 41 CBC showed hemoglobin of 9.4 and uric acid 10.6    Imaging: Dg Chest 2 View  Result Date: 10/18/2015 CLINICAL DATA:  Chest pain for 1 day EXAM: CHEST  2 VIEW COMPARISON:  09/06/2015 FINDINGS: Hazy ill-defined opacities in the left mid and lower lung zones. Right lung is clear. Normal heart size. No pneumothorax or pleural effusion. IMPRESSION: Patchy left lung airspace opacity. Followup PA and lateral chest X-ray is recommended in 3-4 weeks following trial of antibiotic therapy to ensure resolution and exclude underlying malignancy. Electronically Signed   By: Marybelle Killings M.D.   On: 10/18/2015 15:02   Ct Chest Wo Contrast  Result Date: 10/18/2015 CLINICAL DATA:  75 year old female with shortness of Breath since this morning. Abnormal left lung opacity on radiographs today. Initial encounter. EXAM: CT CHEST WITHOUT CONTRAST TECHNIQUE: Multidetector CT imaging of the chest was performed following the standard protocol without IV contrast. COMPARISON:  Chest radiographs 1459  hours today. Chest CT 02/14/2015. FINDINGS: Cardiovascular: No pericardial effusion. Calcified coronary artery atherosclerosis. Mild calcified aortic atherosclerosis. Mediastinum/Nodes: No lymphadenopathy. Lungs/Pleura: Curvilinear opacity about the left hilum involving the lingula and lateral basal segment of the lower lobe appears to correspond to the radiographic finding today and most resembles progressive scarring or atelectasis. Left lower lobe bronchiectasis is more apparent than in February (series 205, image 87). Other left perihilar and upper lobe bronchiectasis also noted. There is a tiny left anterior upper lobe subpleural nodule on series 205, image 55 which is new and appears inflammatory. Major airways are patent. Chronic right lower lobe bronchiectasis and volume loss in the medial basal segment appears stable. Mild scarring or atelectasis in the right costophrenic angle and superior segment right lower lobe. Previously seen subtle 2-3 mm right upper lobe nodule has resolved and was inflammatory. Mild right upper lobe bronchiectasis. No pleural effusion. Upper Abdomen: Stable and negative visualized noncontrast liver, gallbladder, spleen, pancreas, adrenal glands, and kidneys. Negative visualized bowel. Musculoskeletal: Heterogeneous bone mineralization throughout the spine appears stable. No acute osseous abnormality identified. IMPRESSION: 1. Bronchiectasis. Mild increased scarring or atelectasis in the left lung responsible for the patchy opacity on radiographs today. 2. There is a tiny left upper lobe subpleural nodule which is new since February and is likely inflammatory. The 2-3 mm right lung nodule seen in February has resolved. No other acute lung finding. No follow-up needed if patient is low-risk. Non-contrast chest CT can be considered in 12 months if patient is high-risk. This recommendation follows the consensus statement: Guidelines for Management of Incidental Pulmonary Nodules  Detected on CT Images: From the Fleischner Society 2017; Radiology 2017; 284:228-243. 3. Calcified coronary artery and to a lesser extent aortic atherosclerosis. Electronically Signed   By: Genevie Ann M.D.   On: 10/18/2015 17:39   Nm Myocar Multi W/spect W/wall Motion / Ef  Result Date: 10/21/2015 CLINICAL DATA:  75 year old female with a history of chest pain. Cardiovascular risk factors include smoking, diabetes, hypertension, family history EXAM: MYOCARDIAL IMAGING WITH SPECT (REST AND PHARMACOLOGIC-STRESS) GATED LEFT VENTRICULAR WALL MOTION STUDY LEFT VENTRICULAR EJECTION FRACTION TECHNIQUE: Standard myocardial SPECT imaging was performed after resting intravenous injection of 10 mCi Tc-29m tetrofosmin. Subsequently, intravenous infusion of Lexiscan was performed under the supervision of the Cardiology staff. At peak effect of the drug, 30 mCi Tc-37m tetrofosmin was injected intravenously and standard myocardial SPECT imaging was performed. Quantitative gated imaging was also performed to evaluate left ventricular wall motion, and estimate left  ventricular ejection fraction. COMPARISON:  None. FINDINGS: Perfusion: Small fixed perfusion defect at the septal wall towards the apex. No reversible perfusion defect identified. Wall Motion: Normal left ventricular wall motion. No left ventricular dilation. Left Ventricular Ejection Fraction: 53 % End diastolic volume 765 ml End systolic volume 50 ml IMPRESSION: 1. Small fixed perfusion defect at the septal wall towards the apex. No reversible perfusion defect. 2. Normal left ventricular wall motion. 3. Left ventricular ejection fraction 53% 4. Non invasive risk stratification*: Low *2012 Appropriate Use Criteria for Coronary Revascularization Focused Update: J Am Coll Cardiol. 4650;35(4):656-812. http://[email or web address].aspx?articleid=[email or web address] Electronically Signed   By: Corrie Mckusick D.O.   On: 10/21/2015 14:53    Speciality Comments: No specialty  comments available.    Procedures:  No procedures performed Allergies: Methotrexate derivatives; Other; Penicillins; Enbrel [etanercept]; Remicade [infliximab]; and Zocor [simvastatin]   Assessment / Plan: Visit Diagnoses: Rheumatoid arthritis, involving unspecified site, unspecified rheumatoid factor presence ([place]) Patient is having a flare of her rheumatoid arthritis with pain and swelling in multiple joints. She had to come off Orencia due to recent infection which caused the flare. I will give prednisone taper 5 mg tablets, 4 tablets for 4 days, 3 for 4 days, 2 for 4 days, 1 for 4 days and half for 4 days.  High risk medication use She is on IV Orencia monthly which was on hold and she is scheduled to get to this Friday. We have been monitoring her labs,recent labs were during the hospitalization.  Chronic idiopathic gout involving toe without tophus Her uric acid was very high she states she has not missed her allopurinol at all. I will check her uric acid again today. We will adjust her allopurinol dose accordingly.  H/O total knee replacement, left It continues to cause some chronic discomfort.  Lower back pain She has had chronic problems with lower back requiring epidurals in the past. It has also flared now. I will give handout on lower back exercises.  History of positive PPD Treated with INH. Requires yearly CXR.  Shortness of breath Patient had recent hospitalization for shortness of breath. Her chest x-ray and CT chest was abnormal. I will refer her to pulmonary for evaluation of her abnormal chest CT. I spoke with Dr. Chase Caller today and he will try to work her in this Friday. We will hold off Orencia infusion until she sees the pulmonologist.   Follow-Up Instructions: No Follow-up on file.  Orders: No orders of the defined types were placed in this encounter.  Meds ordered this encounter  Medications  . allopurinol (ZYLOPRIM) 300 MG tablet    Sig: Take 300 mg by  mouth daily.

## 2015-10-31 LAB — URIC ACID: URIC ACID, SERUM: 8.2 mg/dL — AB (ref 2.5–7.0)

## 2015-10-31 NOTE — Telephone Encounter (Signed)
I can see Friday morning 10/27  or at 13.15  Thanks  M

## 2015-10-31 NOTE — Telephone Encounter (Signed)
ATC pt, unable to leave VM - automated voice message stated "enter your remote access number". WCB.

## 2015-10-31 NOTE — Telephone Encounter (Signed)
Please refer to other phone note from 10.24.17. Will sign off.

## 2015-11-01 ENCOUNTER — Telehealth: Payer: Self-pay | Admitting: Radiology

## 2015-11-01 ENCOUNTER — Ambulatory Visit: Payer: Medicare Other | Admitting: Cardiology

## 2015-11-01 NOTE — Telephone Encounter (Signed)
-----   Message from Bo Merino, MD sent at 11/01/2015 12:53 PM EDT ----- better

## 2015-11-01 NOTE — Telephone Encounter (Signed)
Called patient to advise her Uric Acid has improved from previous.

## 2015-11-02 ENCOUNTER — Ambulatory Visit (INDEPENDENT_AMBULATORY_CARE_PROVIDER_SITE_OTHER): Payer: Medicare Other | Admitting: Cardiology

## 2015-11-02 ENCOUNTER — Ambulatory Visit (HOSPITAL_COMMUNITY): Payer: Medicare Other

## 2015-11-02 ENCOUNTER — Encounter: Payer: Self-pay | Admitting: Internal Medicine

## 2015-11-02 ENCOUNTER — Encounter: Payer: Self-pay | Admitting: Cardiology

## 2015-11-02 ENCOUNTER — Other Ambulatory Visit: Payer: Self-pay | Admitting: Rheumatology

## 2015-11-02 ENCOUNTER — Ambulatory Visit (INDEPENDENT_AMBULATORY_CARE_PROVIDER_SITE_OTHER): Payer: Medicare Other | Admitting: Internal Medicine

## 2015-11-02 VITALS — BP 116/72 | HR 66 | Ht 64.0 in | Wt 197.0 lb

## 2015-11-02 VITALS — BP 132/74 | HR 57 | Ht 64.0 in | Wt 195.8 lb

## 2015-11-02 DIAGNOSIS — R0789 Other chest pain: Secondary | ICD-10-CM

## 2015-11-02 DIAGNOSIS — M25512 Pain in left shoulder: Secondary | ICD-10-CM

## 2015-11-02 DIAGNOSIS — M069 Rheumatoid arthritis, unspecified: Secondary | ICD-10-CM

## 2015-11-02 DIAGNOSIS — R911 Solitary pulmonary nodule: Secondary | ICD-10-CM | POA: Diagnosis not present

## 2015-11-02 DIAGNOSIS — I493 Ventricular premature depolarization: Secondary | ICD-10-CM | POA: Diagnosis not present

## 2015-11-02 DIAGNOSIS — Z7189 Other specified counseling: Secondary | ICD-10-CM | POA: Insufficient documentation

## 2015-11-02 DIAGNOSIS — M0579 Rheumatoid arthritis with rheumatoid factor of multiple sites without organ or systems involvement: Secondary | ICD-10-CM

## 2015-11-02 DIAGNOSIS — I350 Nonrheumatic aortic (valve) stenosis: Secondary | ICD-10-CM

## 2015-11-02 DIAGNOSIS — Z79899 Other long term (current) drug therapy: Secondary | ICD-10-CM | POA: Diagnosis not present

## 2015-11-02 DIAGNOSIS — J479 Bronchiectasis, uncomplicated: Secondary | ICD-10-CM

## 2015-11-02 DIAGNOSIS — Z5181 Encounter for therapeutic drug level monitoring: Secondary | ICD-10-CM | POA: Diagnosis not present

## 2015-11-02 DIAGNOSIS — E7800 Pure hypercholesterolemia, unspecified: Secondary | ICD-10-CM

## 2015-11-02 LAB — NITRIC OXIDE: NITRIC OXIDE: 16

## 2015-11-02 NOTE — Progress Notes (Signed)
Subjective:    Patient ID: [name], female    DOB: March 01, 1940, 75 y.o.   MRN: [id number]  PCP Lilian Coma, MD   HPI   IOV 11/02/2015  Chief Complaint  Patient presents with  . Pulmonary Consult    Pt referred by Dr. Estanislado Pandy. Pt c/o DOE and occassional prod cough with grey to yellow mucus and chest tightness when SOB - resolves with rest.    75 year old female with significant rheumatoid arthritis referred by Dr Bronson Curb evaluate pulmonary nodule and safety for T-cell close to relation blocker Orencia which is known to increased risk for infections and also COPD exacerbations. She has past medical history of cardiac murmur not otherwise specified, chronic kidney disease stage III With a baseline creatinine of 1.16/GFR 52, chronic diastolic heart failure, hypothyroidism, hypertension, diabetes and long-standing rheumatoid arthritis of her joints. She also has obesity. She says she's been on Enbrel and methotrexate in the past N/A that she was intolerant of this has had side effects. In the last 5 or 6 months she's been started on Orencia. She missed the second dose or so because of gum infection. She's not had it consistently for the last 4 months or so. As a once monthly injection. She does not think it is helping her hand joints arthralgia. She is scheduled for infusion today 11/02/2015.   She says during a routine visit on 10/18/2015 to rheumatology she had sweats and chest pain, cough wheezing. She was then taken emergently to [facility] where she was hospitalized for 3 days. She tells me the diagnosis was one of congestive heart failure but in review of the chart it appears that she went an unremarkable echocardiogram [specifically it was reported that she had normal diastolic dysfunction and normal pulmonary artery pressures but with a mild aortic stenosis] and unremarkable Lexiscan stress test [cardiac cath in 2015 August was normal coronary anatomy]. She  underwent CT scan of the chest that I personally visualized and she has a very small subpleural nodule solitary which is new compared to one year ago but a previous nodule that was that had disappeared. Lung parenchyma  Is otherwise unremarkable except for some lower lobe bronchiectasis that is mild Of note she did have some acute kidney injury during this admission and she was hydrated [was supposed to diuresis] and her kidney injury improving. Her Lasix was held.  She says that she also had a similar episode of admission one year ago. Review of the chart shows this was in August 2016 and the principal problem at that time was hypoglycemia and acute renal failure which is prerenal in etiology  Between these 2 episodes she only had mild exertional fatigue and shortness of breath which she attributes to obesity. But since the August 2017 admission she says that her chest pain chest tightness cough and wheezing have all improved but she still not at baseline. Exhaled nitric oxide today 11/02/2015 and 16 ppb and normal and not consistent with asthma.   IMPRESSION: 1. Bronchiectasis. Mild increased scarring or atelectasis in the left lung responsible for the patchy opacity on radiographs today. 2. There is a tiny left upper lobe subpleural nodule which is new since February and is likely inflammatory. The 2-3 mm right lung nodule seen in February has resolved. No other acute lung finding. No follow-up needed if patient is low-risk. Non-contrast chest CT can be considered in 12 months if patient is high-risk. This recommendation follows the consensus statement: Guidelines for Management  of Incidental Pulmonary Nodules Detected on CT Images: From the Fleischner Society 2017; Radiology 2017; 284:228-243. 3. Calcified coronary artery and to a lesser extent aortic atherosclerosis.   Electronically Signed   By: Genevie Ann M.D.   On: 10/18/2015 17:39   has a past medical history of Allergic rhinitis;  Anemia; Aortic stenosis (12/22/2012); CHF (congestive heart failure) ([place]); Chronic pain; CKD (chronic kidney disease), stage III; Diabetes mellitus; Diverticulosis; DVT (deep venous thrombosis) ([place]); Glaucoma; Hemorrhoids; HTN (hypertension); Hypercholesteremia; Hyperlipidemia (12/22/2012); Hypothyroidism; Meningioma ([place]) (2008); Osteoarthritis; Peripheral neuropathy ([place]); PPD positive; PVC (premature ventricular contraction) (12/22/2012); Rheumatoid arthritis(714.0); Sickle cell trait ([place]); and Tuberculosis.   reports that she quit smoking about 53 years ago. Her smoking use included Cigarettes. She has a 0.20 pack-year smoking history. She has never used smokeless tobacco.  Past Surgical History:  Procedure Laterality Date  . ABDOMINAL HYSTERECTOMY  1978   TAH,& BSO 1  . fistula repair  03/2010   partial colectomy  . intracranial surgery  11/2007   removal of meningioma  . JOINT REPLACEMENT  2001   left TKR  . LEFT HEART CATHETERIZATION WITH CORONARY ANGIOGRAM N/A 08/08/2013   Procedure: LEFT HEART CATHETERIZATION WITH CORONARY ANGIOGRAM;  Surgeon: Peter M Martinique, MD;  Location: [facility] CATH LAB;  Service: Cardiovascular;  Laterality: N/A;  . ROTATOR CUFF REPAIR  1999   BIlateral  . SIGMOIDECTOMY  2012  . TONSILLECTOMY AND ADENOIDECTOMY    . TOTAL THYROIDECTOMY  1992  . wrist sx     right     Allergies  Allergen Reactions  . Methotrexate Derivatives Other (See Comments)    Elevated creat. Levels  . Other Other (See Comments)    NO Blood products.  [name]- causes low blood sugar (pt states she was taking medication but cant remember what it was for.)  . Penicillins Rash    Has patient had a PCN reaction causing immediate rash, facial/tongue/throat swelling, SOB or lightheadedness with hypotension: Yes Has patient had a PCN reaction causing severe rash involving mucus membranes or skin necrosis: Yes  Has patient had a PCN reaction that required hospitalization No Has patient  had a PCN reaction occurring within the last 10 years: No If all of the above answers are "NO", then may proceed with Cephalosporin use.   . Enbrel [Etanercept] Other (See Comments)    Inadequate response    . Remicade [Infliximab] Other (See Comments)    Inadequate response   . Zocor [Simvastatin] Other (See Comments)    Leg cramps     Immunization History  Administered Date(s) Administered  . Influenza-Unspecified 09/11/2015    Family History  Problem Relation Age of Onset  . Hypotension Mother   . CVA Mother   . Diabetes Mother   . Hypertension Father   . Kidney failure Father      Current Outpatient Prescriptions:  .  abatacept (ORENCIA) 250 MG injection, Inject 750 mg into the vein. Infuse 750 mg / 3 vials over 30 minutes every 4 weeks, Disp: , Rfl:  .  acetaminophen (TYLENOL) 500 MG tablet, Take 1,000 mg by mouth every 6 (six) hours as needed for mild pain., Disp: , Rfl:  .  allopurinol (ZYLOPRIM) 300 MG tablet, Take 300 mg by mouth daily., Disp: , Rfl:  .  aspirin EC 81 MG EC tablet, Take 1 tablet (81 mg total) by mouth daily., Disp: 30 tablet, Rfl: 0 .  atorvastatin (LIPITOR) 40 MG tablet, Take 40 mg by mouth daily at 6 PM. ,  Disp: , Rfl:  .  cholecalciferol (VITAMIN D) 1000 UNITS tablet, Take 1,000 Units by mouth daily. , Disp: , Rfl:  .  cyclobenzaprine (FLEXERIL) 10 MG tablet, Take 10 mg by mouth 3 (three) times daily as needed for muscle spasms. , Disp: , Rfl:  .  diphenhydrAMINE (BENADRYL) 25 MG tablet, Take 50 mg by mouth See admin instructions. One hour prior to Orencia infusion, Disp: , Rfl:  .  ferrous sulfate (FEOSOL) 325 (65 FE) MG tablet, Take 325-650 mg by mouth daily with breakfast., Disp: , Rfl:  .  folic acid (FOLVITE) 1 MG tablet, Take 1 mg by mouth daily., Disp: , Rfl:  .  HYDROcodone-acetaminophen (NORCO/VICODIN) 5-325 MG per tablet, Take 1-2 tablets by mouth every 3 (three) hours as needed for moderate pain., Disp: 30 tablet, Rfl: 0 .  insulin  NPH-regular Human (NOVOLIN 70/30) (70-30) 100 UNIT/ML injection, Inject 6-8 Units into the skin 3 (three) times daily with meals. Sliding source, Disp: , Rfl:  .  levothyroxine (SYNTHROID, LEVOTHROID) 137 MCG tablet, Take 137 mcg by mouth daily before breakfast., Disp: , Rfl:  .  losartan (COZAAR) 50 MG tablet, Take 50 mg by mouth every evening., Disp: , Rfl:  .  metoprolol tartrate (LOPRESSOR) 25 MG tablet, Take 1 tablet (25 mg total) by mouth 2 (two) times daily as needed. (Patient taking differently: Take 25 mg by mouth daily. ), Disp: 60 tablet, Rfl: 6 .  multivitamin (THERAGRAN) per tablet, Take 1 tablet by mouth daily. , Disp: , Rfl:  .  predniSONE (DELTASONE) 5 MG tablet, 4 tablets for 4 days, 3 for 4 days, 2 for 4 days, 1 for 4 days and half for 4 days., Disp: 42 tablet, Rfl: 0 .  pregabalin (LYRICA) 75 MG capsule, Take 1 capsule (75 mg total) by mouth daily., Disp: 28 capsule, Rfl: 0    Review of Systems  Constitutional: Negative for fever and unexpected weight change.  HENT: Positive for congestion. Negative for dental problem, ear pain, nosebleeds, postnasal drip, rhinorrhea, sinus pressure, sneezing, sore throat and trouble swallowing.   Eyes: Negative for redness and itching.  Respiratory: Positive for shortness of breath. Negative for cough, chest tightness and wheezing.   Cardiovascular: Positive for palpitations and leg swelling.  Gastrointestinal: Positive for diarrhea. Negative for nausea and vomiting.  Genitourinary: Negative for dysuria.  Musculoskeletal: Negative for joint swelling.  Skin: Negative for rash.  Neurological: Negative for headaches.  Hematological: Does not bruise/bleed easily.  Psychiatric/Behavioral: Negative for dysphoric mood. The patient is not nervous/anxious.        Objective:   Physical Exam  Constitutional: She is oriented to person, place, and time. She appears well-developed and well-nourished. No distress.  Body mass index is 33.81 kg/m.     HENT:  Head: Normocephalic and atraumatic.  Right Ear: External ear normal.  Left Ear: External ear normal.  Mouth/Throat: Oropharynx is clear and moist. No oropharyngeal exudate.  Eyes: Conjunctivae and EOM are normal. Pupils are equal, round, and reactive to light. Right eye exhibits no discharge. Left eye exhibits no discharge. No scleral icterus.  Neck: Normal range of motion. Neck supple. No JVD present. No tracheal deviation present. No thyromegaly present.  Cardiovascular: Normal rate, regular rhythm and intact distal pulses.  Exam reveals no gallop and no friction rub.   Murmur heard. Pulmonary/Chest: Effort normal and breath sounds normal. No respiratory distress. She has no wheezes. She has no rales. She exhibits no tenderness.  ? Faint crackles  Abdominal:  Soft. Bowel sounds are normal. She exhibits no distension and no mass. There is no tenderness. There is no rebound and no guarding.  Musculoskeletal: Normal range of motion. She exhibits no edema or tenderness.  Warm hands Deformity in fingers Antalgic gait   Lymphadenopathy:    She has no cervical adenopathy.  Neurological: She is alert and oriented to person, place, and time. She has normal reflexes. No cranial nerve deficit. She exhibits normal muscle tone. Coordination normal.  Skin: Skin is warm and dry. No rash noted. She is not diaphoretic. No erythema. No pallor.  Psychiatric: She has a normal mood and affect. Her behavior is normal. Judgment and thought content normal.  Vitals reviewed.  Vitals:   11/02/15 1142  BP: 116/72  Pulse: 66  SpO2: 99%  Weight: 197 lb (89.4 kg)  Height: 5\' 4"  (1.626 m)    Estimated body mass index is 34.5 kg/m as calculated from the following:   Height as of 10/30/15: 5\' 4"  (1.626 m).   Weight as of 10/30/15: 201 lb (91.2 kg).         Assessment & Plan:     ICD-9-CM ICD-10-CM   1. Nodule of left lung 793.11 R91.1 CT Chest Wo Contrast     Nitric oxide  2. Bronchiectasis  without complication (HCC) 449.6 J47.9   3. Rheumatoid arthritis flare (HCC) 714.0 M06.9   4. High risk medication use V58.69 Z79.899 Nitric oxide  5. Encounter for therapeutic drug monitoring V58.83 Z51.81     She has incidental findings on a CT scan one is very small diameter unspecified but probably less than 4 mm nodule of the left lung which is new in the last 1 year. Previous nodule while on Orencia infusion has resolved compared to September 2016. She also has some bronchiectasis in the left lung which to me appear similar to one year ago although radiology feels it's more prominent. I suspect these are all incidental findings and probably related to her rheumatoid arthritis. It is impossible to figure out what's causing the nodule given the extremely small size but given the fact it appeared in 1 year is probably inflammatory and could easily be related to rheumatoid arthritis. This no obvious infection. The recent hospitalizations were not due to infection. The recent acute kidney injury was due to dehydration because this improved with IV fluids. At this point in time I do not see any contraindication to her receiving Orencia infusions. Clinically she is nontoxic. I've explained the same to her and she verbalized understanding. Certainly the nodule needs to be followed. Typically we would do a CT scan in one year but given the fact she has rheumatoid arthritis and she is getting immunosuppressive infections I'll see her in 6 months with a repeat CT of the chest.    Dr. Brand Males, M.D., [facility].C.P Pulmonary and Critical Care Medicine Staff Physician [facility] Pager: [phone], If no answer or between  15:00h - 7:00h: call [phone]  11/02/2015 1:01 PM

## 2015-11-02 NOTE — Patient Instructions (Signed)
ICD-9-CM ICD-10-CM   1. Nodule of left lung 793.11 R91.1   2. Rheumatoid arthritis flare (HCC) 714.0 M06.9   3. High risk medication use V58.69 Z79.899   4. Encounter for therapeutic drug monitoring V58.83 Z51.81    Ok for Orencia Lung nodule - unclear cause but doubt is something that orencia can flare up  followup  ct chest in 6 months wo contrast

## 2015-11-02 NOTE — Patient Instructions (Signed)
**Note De-Identified  Obfuscation** Medication Instructions:  Same-no changes  Labwork: None  Testing/Procedures: None  Follow-Up: Your physician recommends that you schedule a follow-up appointment in: 3 months with Dr Marlou Porch.     If you need a refill on your cardiac medications before your next appointment, please call your pharmacy.

## 2015-11-02 NOTE — Progress Notes (Signed)
Ok to infuse Orencia IV

## 2015-11-02 NOTE — Telephone Encounter (Signed)
Called and spoke to pt. Pt aware of appt time and date ( 11/02/15 at 32). Nothing further needed at this time.

## 2015-11-02 NOTE — Progress Notes (Signed)
Cardiology Office Note   Date:  11/02/2015   ID:  [name], DOB [birth year], MRN [id number]  PCP:  Lilian Coma, MD  Cardiologist:  Dr. Marlou Porch     Chief Complaint  Patient presents with  . Hospitalization Follow-up    occ chest pain, + Lt shoulder pain      History of Present Illness: [name] is a 75 y.o. female who presents for post hospital for chest pain.  Hx of patent coronary arteries in 2015. Chest CT this admit also showed calcified coronary artery atherosclerosis.    NUC study was done and there was a small fixed perfusion defect in the septal wall but no reversible perfusion defect.  She has mild aortic stenosis/insufficiency on Echo. Her troponins were negative.    She was just seen by Dr. Chase Caller and was cleared for the [place].  Today she is doing well with occ chest pain. Still with mild lower ext edema.  She has Lt shoulder pain has difficulty lifting the arm, she has had rotator cuff repair in that shoulder.  She ambulates with a cane.      Past Medical History:  Diagnosis Date  . Allergic rhinitis   . Anemia   . Aortic stenosis 12/22/2012   Mild 3/14-2.35 m/s peak velocity  . CHF (congestive heart failure) ([place])   . Chronic pain   . CKD (chronic kidney disease), stage III   . Diabetes mellitus   . Diverticulosis   . DVT (deep venous thrombosis) ([place])   . Glaucoma   . Hemorrhoids    internal  . HTN (hypertension)   . Hypercholesteremia   . Hyperlipidemia 12/22/2012  . Hypothyroidism   . Meningioma ([place]) 2008  . Osteoarthritis   . Peripheral neuropathy ([place])   . PPD positive    6 months ago  . PVC (premature ventricular contraction) 12/22/2012  . Rheumatoid arthritis(714.0)   . Sickle cell trait ([place])   . 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 ABDOMINAL HYSTERECTOMY  1978   TAH,& BSO 1  . fistula repair   03/2010   partial colectomy  . intracranial surgery  11/2007   removal of meningioma  . JOINT REPLACEMENT  2001   left TKR  . LEFT HEART CATHETERIZATION WITH CORONARY ANGIOGRAM N/A 08/08/2013   Procedure: LEFT HEART CATHETERIZATION WITH CORONARY ANGIOGRAM;  Surgeon: Peter M Martinique, MD;  Location: [facility] CATH LAB;  Service: Cardiovascular;  Laterality: N/A;  . ROTATOR CUFF REPAIR  1999   BIlateral  . SIGMOIDECTOMY  2012  . TONSILLECTOMY AND ADENOIDECTOMY    . TOTAL THYROIDECTOMY  1992  . wrist sx     right      Current Outpatient Prescriptions  Medication Sig Dispense Refill  . abatacept (ORENCIA) 250 MG injection Inject 750 mg into the vein. Infuse 750 mg / 3 vials over 30 minutes every 4 weeks    . acetaminophen (TYLENOL) 500 MG tablet Take 1,000 mg by mouth every 6 (six) hours as needed for mild pain.    Marland Kitchen allopurinol (ZYLOPRIM) 300 MG tablet Take 300 mg by mouth daily.    Marland Kitchen aspirin EC 81 MG EC tablet Take 1 tablet (81 mg total) by mouth daily. 30 tablet 0  . atorvastatin (LIPITOR) 40 MG tablet Take 40 mg by mouth daily at 6 PM.     .  cholecalciferol (VITAMIN D) 1000 UNITS tablet Take 1,000 Units by mouth daily.     . cyclobenzaprine (FLEXERIL) 10 MG tablet Take 10 mg by mouth 3 (three) times daily as needed for muscle spasms.     . diphenhydrAMINE (BENADRYL) 25 MG tablet Take 50 mg by mouth See admin instructions. One hour prior to Orencia infusion    . ferrous sulfate (FEOSOL) 325 (65 FE) MG tablet Take 325-650 mg by mouth daily with breakfast.    . folic acid (FOLVITE) 1 MG tablet Take 1 mg by mouth daily.    Marland Kitchen HYDROcodone-acetaminophen (NORCO/VICODIN) 5-325 MG per tablet Take 1-2 tablets by mouth every 3 (three) hours as needed for moderate pain. 30 tablet 0  . insulin NPH-regular Human (NOVOLIN 70/30) (70-30) 100 UNIT/ML injection Inject 6-8 Units into the skin 3 (three) times daily with meals. Sliding source    . levothyroxine (SYNTHROID, LEVOTHROID) 137 MCG tablet Take 137 mcg by  mouth daily before breakfast.    . losartan (COZAAR) 50 MG tablet Take 50 mg by mouth every evening.    . metoprolol tartrate (LOPRESSOR) 25 MG tablet Take 1 tablet (25 mg total) by mouth 2 (two) times daily as needed. (Patient taking differently: Take 25 mg by mouth daily. ) 60 tablet 6  . multivitamin (THERAGRAN) per tablet Take 1 tablet by mouth daily.     . predniSONE (DELTASONE) 5 MG tablet 4 tablets for 4 days, 3 for 4 days, 2 for 4 days, 1 for 4 days and half for 4 days. 42 tablet 0  . pregabalin (LYRICA) 75 MG capsule Take 1 capsule (75 mg total) by mouth daily. 28 capsule 0   No current facility-administered medications for this visit.     Allergies:   Methotrexate derivatives; Other; Penicillins; Enbrel [etanercept]; Remicade [infliximab]; and Zocor [simvastatin]    Social History:  The patient  reports that she quit smoking about 53 years ago. Her smoking use included Cigarettes. She has a 0.20 pack-year smoking history. She has never used smokeless tobacco. She reports that she drinks alcohol. She reports that she does not use drugs.   Family History:  The patient's family history includes CVA in her mother; Diabetes in her mother; Hypertension in her father; Hypotension in her mother; Kidney failure in her father.    ROS:  General:no colds or fevers, some weight loss Skin:no rashes or ulcers HEENT:no blurred vision, no congestion CV:see HPI PUL:see HPI GI:no diarrhea constipation or melena, no indigestion GU:no hematuria, no dysuria MS:+ Lt shoulder pain with movement and touch, no claudication Neuro:no syncope, no lightheadedness Endo:+ diabetes stable, no thyroid disease  Wt Readings from Last 3 Encounters:  11/02/15 195 lb 12.8 oz (88.8 kg)  11/02/15 197 lb (89.4 kg)  10/30/15 201 lb (91.2 kg)     PHYSICAL EXAM: VS:  BP 132/74   Pulse (!) 57   Ht 5\' 4"  (1.626 m)   Wt 195 lb 12.8 oz (88.8 kg)   SpO2 98%   BMI 33.61 kg/m  , BMI Body mass index is 33.61  kg/m. General:Pleasant affect, NAD Skin:Warm and dry, brisk capillary refill HEENT:normocephalic, sclera clear, mucus membranes moist Neck:supple, no JVD, no bruits  Heart:S1S2 RRR with soft systolic  murmur, no gallup, rub or click Lungs:clear without rales, rhonchi, or wheezes GUR:KYHC, non tender, + BS, do not palpate liver spleen or masses Ext:no lower ext edema, 2+ pedal pulses, 2+ radial pulses, + pain to palpation of Lt shoulder, pain with raising Lt arm.  Neuro:alert and oriented X 3, MAE, follows commands, + facial symmetry    EKG:  EKG is NOT ordered today.    Recent Labs: 01/26/2015: ALT 18 10/18/2015: B Natriuretic Peptide 46.4; Hemoglobin 10.4; Platelets 312 10/21/2015: BUN 20; Creatinine, Ser 1.16; Potassium 4.2; Sodium 140    Lipid Panel    Component Value Date/Time   CHOL 205 (H) 08/06/2013 0424   TRIG 126 08/06/2013 0424   HDL 84 08/06/2013 0424   CHOLHDL 2.4 08/06/2013 0424   VLDL 25 08/06/2013 0424   LDLCALC 96 08/06/2013 0424       Other studies Reviewed: Additional studies/ records that were reviewed today include:  .ECHO 10/19/15   Study Conclusions  - Left ventricle: The cavity size was normal. Systolic function was   normal. The estimated ejection fraction was in the range of 55%   to 60%. Wall motion was normal; there were no regional wall   motion abnormalities. Left ventricular diastolic function   parameters were normal. - Aortic valve: There was mild stenosis. There was mild   regurgitation. Valve area (VTI): 1.25 cm^2. Valve area (Vmax):   1.01 cm^2. Valve area (Vmean): 1.15 cm^2. - Mitral valve: There was mild regurgitation. - Left atrium: The atrium was mildly dilated. - Right atrium: The atrium was mildly dilated.   NUCLEAR STRESS TEST 10/21/15.  FINDINGS: Perfusion: Small fixed perfusion defect at the septal wall towards the apex. No reversible perfusion defect identified.  Wall Motion: Normal left ventricular wall motion.  No left ventricular dilation.  Left Ventricular Ejection Fraction: 53 %  End diastolic volume 956 ml  End systolic volume 50 ml  IMPRESSION: 1. Small fixed perfusion defect at the septal wall towards the apex. No reversible perfusion defect.  2. Normal left ventricular wall motion.  3. Left ventricular ejection fraction 53%  4. Non invasive risk stratification*: Low  Cardiac catheterization 08/08/13-normal coronary arteries  Event monitor 2/14 showed no adverse arrhythmias.PVC's   ASSESSMENT AND PLAN:  1.  Chest pain not felt to be cardiac with normal nuc study and stable Echo. Hx of normal cath 2015.  Follow up with Dr. Marlou Porch in 3 months.    2. Mild aortic stenosis, stable on recent Echo this month.    3.  HTN stable  4.  Hyperlipidemia on atorvastatin followed by PCP  5. Syncope stable. No arrhthymias no further episodes    Current medicines are reviewed with the patient today.  The patient Has no concerns regarding medicines.  The following changes have been made:  See above Labs/ tests ordered today include:see above  Disposition:   FU:  see above  Signed, Cecilie Kicks, NP  11/02/2015 4:28 PM    [address], [place], [place], [place] Phone: [phone]; Fax: [phone]

## 2015-11-05 ENCOUNTER — Telehealth: Payer: Self-pay | Admitting: Rheumatology

## 2015-11-05 NOTE — Telephone Encounter (Signed)
Dr Chase Caller has given clearance for her to infuse. I have advised patient she may infuse per Dr Estanislado Pandy and Dr Chase Caller

## 2015-11-05 NOTE — Telephone Encounter (Signed)
Patient would like to know when she can resume taking Orencia.

## 2015-11-12 ENCOUNTER — Ambulatory Visit (HOSPITAL_COMMUNITY)
Admission: RE | Admit: 2015-11-12 | Discharge: 2015-11-12 | Disposition: A | Discharge status: Home / Self Care | Payer: Medicare Other | Source: Ambulatory Visit | Attending: Rheumatology | Admitting: Rheumatology

## 2015-11-12 DIAGNOSIS — M0579 Rheumatoid arthritis with rheumatoid factor of multiple sites without organ or systems involvement: Secondary | ICD-10-CM | POA: Diagnosis not present

## 2015-11-12 LAB — CBC WITH DIFFERENTIAL/PLATELET
BASOS ABS: 0 10*3/uL (ref 0.0–0.1)
Basophils Relative: 0 %
Eosinophils Absolute: 0.2 10*3/uL (ref 0.0–0.7)
Eosinophils Relative: 2 %
HEMATOCRIT: 29.6 % — AB (ref 36.0–46.0)
HEMOGLOBIN: 9.6 g/dL — AB (ref 12.0–15.0)
LYMPHS PCT: 53 %
Lymphs Abs: 4.9 10*3/uL — ABNORMAL HIGH (ref 0.7–4.0)
MCH: 27.2 pg (ref 26.0–34.0)
MCHC: 32.4 g/dL (ref 30.0–36.0)
MCV: 83.9 fL (ref 78.0–100.0)
MONO ABS: 0.7 10*3/uL (ref 0.1–1.0)
MONOS PCT: 7 %
NEUTROS ABS: 3.5 10*3/uL (ref 1.7–7.7)
NEUTROS PCT: 38 %
Platelets: 286 10*3/uL (ref 150–400)
RBC: 3.53 MIL/uL — ABNORMAL LOW (ref 3.87–5.11)
RDW: 15.7 % — AB (ref 11.5–15.5)
WBC: 9.4 10*3/uL (ref 4.0–10.5)

## 2015-11-12 LAB — COMPREHENSIVE METABOLIC PANEL
ALBUMIN: 3.5 g/dL (ref 3.5–5.0)
ALT: 14 U/L (ref 14–54)
ANION GAP: 9 (ref 5–15)
AST: 25 U/L (ref 15–41)
Alkaline Phosphatase: 88 U/L (ref 38–126)
BUN: 13 mg/dL (ref 6–20)
CO2: 25 mmol/L (ref 22–32)
Calcium: 9.6 mg/dL (ref 8.9–10.3)
Chloride: 107 mmol/L (ref 101–111)
Creatinine, Ser: 1.31 mg/dL — ABNORMAL HIGH (ref 0.44–1.00)
GFR calc Af Amer: 45 mL/min — ABNORMAL LOW (ref >60)
GFR calc non Af Amer: 38 mL/min — ABNORMAL LOW (ref >60)
GLUCOSE: 142 mg/dL — AB (ref 65–99)
POTASSIUM: 4.5 mmol/L (ref 3.5–5.1)
SODIUM: 141 mmol/L (ref 135–145)
Total Bilirubin: 0.7 mg/dL (ref 0.3–1.2)
Total Protein: 6.2 g/dL — ABNORMAL LOW (ref 6.5–8.1)

## 2015-11-12 MED ORDER — DIPHENHYDRAMINE HCL 25 MG PO CAPS: 25.0000 mg | ORAL_CAPSULE | Freq: Once | ORAL | Status: DC

## 2015-11-12 MED ORDER — ACETAMINOPHEN 325 MG PO TABS: 650.0000 mg | ORAL_TABLET | Freq: Once | ORAL | Status: DC

## 2015-11-12 MED ORDER — SODIUM CHLORIDE 0.9 % IV SOLN
750.0000 mg | INTRAVENOUS | Status: DC
  Administered 2015-11-12: 750 mg via INTRAVENOUS
  Filled 2015-11-12: qty 30

## 2015-11-14 ENCOUNTER — Telehealth: Payer: Self-pay | Admitting: Radiology

## 2015-11-14 NOTE — Telephone Encounter (Signed)
Patient advised, have put her to front desk to make a follow up appointment

## 2015-11-14 NOTE — Telephone Encounter (Signed)
I have called patient to advise labs c/w previous

## 2015-11-14 NOTE — Telephone Encounter (Signed)
Labs c/w previous call pt

## 2015-12-03 ENCOUNTER — Ambulatory Visit: Payer: Medicare Other | Admitting: Rheumatology

## 2015-12-03 NOTE — Progress Notes (Deleted)
Office Visit Note  Patient: [name]             Date of Birth: 1940-12-07           MRN: [id number]             PCP: Lilian Coma, MD Referring: Jonathon Jordan, MD Visit Date: 12/03/2015 Occupation: @GUAROCC @    Subjective:  No chief complaint on file.   History of Present Illness: [name] is a 75 y.o. female ***   Activities of Daily Living:  Patient reports morning stiffness for *** {minute/hour:19697}.   Patient {ACTIONS;DENIES/REPORTS:21021675::"Denies"} nocturnal pain.  Difficulty dressing/grooming: {ACTIONS;DENIES/REPORTS:21021675::"Denies"} Difficulty climbing stairs: {ACTIONS;DENIES/REPORTS:21021675::"Denies"} Difficulty getting out of chair: {ACTIONS;DENIES/REPORTS:21021675::"Denies"} Difficulty using hands for taps, buttons, cutlery, and/or writing: {ACTIONS;DENIES/REPORTS:21021675::"Denies"}   No Rheumatology ROS completed.   PMFS History:  Patient Active Problem List   Diagnosis Date Noted  . Encounter for therapeutic drug monitoring 11/02/2015  . Bronchiectasis without complication ([place]) 34/74/2595  . Chronic gout involving toe without tophus 10/30/2015  . High risk medication use 10/29/2015  . Gout 10/29/2015  . H/O total knee replacement, left 10/29/2015  . History of positive PPD 10/29/2015  . Diabetes mellitus, type II, insulin dependent ([place]) 10/18/2015  . Rheumatoid arthritis flare ([place]) 10/18/2015  . AKI (acute kidney injury) ([place]) 10/18/2015  . CKD (chronic kidney disease) stage 3, GFR 30-59 ml/min 10/18/2015  . Nodule of left lung 10/18/2015  . Neuropathy ([place]) 08/22/2014  . Hypothyroidism 08/22/2014  . Chronic pain 08/22/2014  . Chronic congestive heart failure with left ventricular diastolic dysfunction ([place]) 08/08/2013  . Chest pain 08/05/2013  . Aortic stenosis 12/22/2012  . Hyperlipidemia 12/22/2012  . PVC (premature ventricular contraction) 12/22/2012  . HTN (hypertension) 12/22/2012  . Open angle with  borderline findings, high risk 12/15/2011  . Cataract, nuclear 05/25/2011    Past Medical History:  Diagnosis Date  . Allergic rhinitis   . Anemia   . Aortic stenosis 12/22/2012   Mild 3/14-2.35 m/s peak velocity  . CHF (congestive heart failure) ([place])   . Chronic pain   . CKD (chronic kidney disease), stage III   . Diabetes mellitus   . Diverticulosis   . DVT (deep venous thrombosis) ([place])   . Glaucoma   . Hemorrhoids    internal  . HTN (hypertension)   . Hypercholesteremia   . Hyperlipidemia 12/22/2012  . Hypothyroidism   . Meningioma ([place]) 2008  . Osteoarthritis   . Peripheral neuropathy ([place])   . PPD positive    6 months ago  . PVC (premature ventricular contraction) 12/22/2012  . Rheumatoid arthritis(714.0)   . Sickle cell trait ([place])   . 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 Hypotension Mother   . CVA Mother   . Diabetes Mother   . Hypertension Father   . Kidney failure Father    Past Surgical History:  Procedure Laterality Date  . ABDOMINAL HYSTERECTOMY  1978   TAH,& BSO 1  . fistula repair  03/2010   partial colectomy  . intracranial surgery  11/2007   removal of meningioma  . JOINT REPLACEMENT  2001   left TKR  . LEFT HEART CATHETERIZATION WITH CORONARY ANGIOGRAM N/A 08/08/2013   Procedure: LEFT HEART CATHETERIZATION WITH CORONARY ANGIOGRAM;  Surgeon: Peter M Martinique, MD;  Location: [facility] CATH LAB;  Service: Cardiovascular;  Laterality: N/A;  .  ROTATOR CUFF REPAIR  1999   BIlateral  . SIGMOIDECTOMY  2012  . TONSILLECTOMY AND ADENOIDECTOMY    . TOTAL THYROIDECTOMY  1992  . wrist sx     right    Social History   Social History Narrative   Daycare Provider     Objective: Vital Signs: There were no vitals taken for this visit.   Physical Exam   Musculoskeletal Exam: ***  CDAI Exam: No CDAI exam completed.    Investigation: No  additional findings.   Imaging: No results found.  Speciality Comments: No specialty comments available.    Procedures:  No procedures performed Allergies: Methotrexate derivatives; Other; Penicillins; Enbrel [etanercept]; Remicade [infliximab]; and Zocor [simvastatin]   Assessment / Plan:     Visit Diagnoses: No diagnosis found.    Orders: No orders of the defined types were placed in this encounter.  No orders of the defined types were placed in this encounter.   Face-to-face time spent with patient was *** minutes. 50% of time was spent in counseling and coordination of care.  Follow-Up Instructions: No Follow-up on file.   Julia Lofts, PA-C

## 2015-12-04 ENCOUNTER — Encounter: Payer: Self-pay | Admitting: Rheumatology

## 2015-12-04 ENCOUNTER — Ambulatory Visit (INDEPENDENT_AMBULATORY_CARE_PROVIDER_SITE_OTHER): Payer: Medicare Other | Admitting: Rheumatology

## 2015-12-04 VITALS — BP 146/69 | HR 91 | Resp 16 | Ht 63.0 in | Wt 198.0 lb

## 2015-12-04 DIAGNOSIS — M25512 Pain in left shoulder: Secondary | ICD-10-CM | POA: Diagnosis not present

## 2015-12-04 DIAGNOSIS — M62838 Other muscle spasm: Secondary | ICD-10-CM

## 2015-12-04 DIAGNOSIS — Z79899 Other long term (current) drug therapy: Secondary | ICD-10-CM | POA: Diagnosis not present

## 2015-12-04 DIAGNOSIS — M069 Rheumatoid arthritis, unspecified: Secondary | ICD-10-CM

## 2015-12-04 MED ORDER — PREDNISONE 5 MG PO TABS: ORAL_TABLET | ORAL | 0 refills | Status: DC

## 2015-12-04 MED ORDER — PREGABALIN 75 MG PO CAPS: ORAL_CAPSULE | ORAL | 0 refills | Status: DC

## 2015-12-04 MED ORDER — ALLOPURINOL 300 MG PO TABS: 300.0000 mg | ORAL_TABLET | Freq: Every day | ORAL | 5 refills | Status: DC

## 2015-12-04 NOTE — Progress Notes (Deleted)
Office Visit Note  Patient: [name]             Date of Birth: [birth year]           MRN: [id number]             PCP: Lilian Coma, MD Referring: Jonathon Jordan, MD Visit Date: 12/04/2015 Occupation: @GUAROCC @    Subjective:  No chief complaint on file.   History of Present Illness: [name] is a 75 y.o. female ***   Activities of Daily Living:  Patient reports morning stiffness for *** {minute/hour:19697}.   Patient {ACTIONS;DENIES/REPORTS:21021675::"Denies"} nocturnal pain.  Difficulty dressing/grooming: {ACTIONS;DENIES/REPORTS:21021675::"Denies"} Difficulty climbing stairs: {ACTIONS;DENIES/REPORTS:21021675::"Denies"} Difficulty getting out of chair: {ACTIONS;DENIES/REPORTS:21021675::"Denies"} Difficulty using hands for taps, buttons, cutlery, and/or writing: {ACTIONS;DENIES/REPORTS:21021675::"Denies"}   No Rheumatology ROS completed.   PMFS History:  Patient Active Problem List   Diagnosis Date Noted  . Encounter for therapeutic drug monitoring 11/02/2015  . Bronchiectasis without complication ([place]) 83/41/9622  . Chronic gout involving toe without tophus 10/30/2015  . High risk medication use 10/29/2015  . Gout 10/29/2015  . H/O total knee replacement, left 10/29/2015  . History of positive PPD 10/29/2015  . Diabetes mellitus, type II, insulin dependent ([place]) 10/18/2015  . Rheumatoid arthritis flare ([place]) 10/18/2015  . AKI (acute kidney injury) ([place]) 10/18/2015  . CKD (chronic kidney disease) stage 3, GFR 30-59 ml/min 10/18/2015  . Nodule of left lung 10/18/2015  . Neuropathy ([place]) 08/22/2014  . Hypothyroidism 08/22/2014  . Chronic pain 08/22/2014  . Chronic congestive heart failure with left ventricular diastolic dysfunction ([place]) 08/08/2013  . Chest pain 08/05/2013  . Aortic stenosis 12/22/2012  . Hyperlipidemia 12/22/2012  . PVC (premature ventricular contraction) 12/22/2012  . HTN (hypertension) 12/22/2012  . Open angle with  borderline findings, high risk 12/15/2011  . Cataract, nuclear 05/25/2011    Past Medical History:  Diagnosis Date  . Allergic rhinitis   . Anemia   . Aortic stenosis 12/22/2012   Mild 3/14-2.35 m/s peak velocity  . CHF (congestive heart failure) ([place])   . Chronic pain   . CKD (chronic kidney disease), stage III   . Diabetes mellitus   . Diverticulosis   . DVT (deep venous thrombosis) ([place])   . Glaucoma   . Hemorrhoids    internal  . HTN (hypertension)   . Hypercholesteremia   . Hyperlipidemia 12/22/2012  . Hypothyroidism   . Meningioma ([place]) 2008  . Osteoarthritis   . Peripheral neuropathy ([place])   . PPD positive    6 months ago  . PVC (premature ventricular contraction) 12/22/2012  . Rheumatoid arthritis(714.0)   . Sickle cell trait ([place])   . 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 Hypotension Mother   . CVA Mother   . Diabetes Mother   . Hypertension Father   . Kidney failure Father    Past Surgical History:  Procedure Laterality Date  . ABDOMINAL HYSTERECTOMY  1978   TAH,& BSO 1  . fistula repair  03/2010   partial colectomy  . intracranial surgery  11/2007   removal of meningioma  . JOINT REPLACEMENT  2001   left TKR  . LEFT HEART CATHETERIZATION WITH CORONARY ANGIOGRAM N/A 08/08/2013   Procedure: LEFT HEART CATHETERIZATION WITH CORONARY ANGIOGRAM;  Surgeon: Peter M Martinique, MD;  Location: [facility] CATH LAB;  Service: Cardiovascular;  Laterality: N/A;  .  ROTATOR CUFF REPAIR  1999   BIlateral  . SIGMOIDECTOMY  2012  . TONSILLECTOMY AND ADENOIDECTOMY    . TOTAL THYROIDECTOMY  1992  . wrist sx     right    Social History   Social History Narrative   Daycare Provider     Objective: Vital Signs: BP (!) 146/69 (BP Location: Left Arm, Patient Position: Sitting, Cuff Size: Large)   Pulse 91   Resp 16   Ht 5\' 3"  (1.6 m)   Wt 198 lb (89.8 kg)   BMI 35.07  kg/m    Physical Exam   Musculoskeletal Exam: ***  CDAI Exam: No CDAI exam completed.    Investigation: No additional findings.   Imaging: No results found.  Speciality Comments: No specialty comments available.    Procedures:  No procedures performed Allergies: Methotrexate derivatives; Other; Penicillins; Enbrel [etanercept]; Remicade [infliximab]; and Zocor [simvastatin]   Assessment / Plan:     Visit Diagnoses: No diagnosis found.    Orders: No orders of the defined types were placed in this encounter.  No orders of the defined types were placed in this encounter.   Face-to-face time spent with patient was *** minutes. 50% of time was spent in counseling and coordination of care.  Follow-Up Instructions: Return in about 5 months (around 05/03/2016) for Rheumatoid arthritis.   Bo Merino, MD

## 2015-12-04 NOTE — Progress Notes (Signed)
Office Visit Note  Patient: [name]             Date of Birth: 06/12/1940           MRN: [id number]             PCP: Lilian Coma, MD Referring: Jonathon Jordan, MD Visit Date: 12/04/2015 Occupation: @GUAROCC @    Subjective:  No chief complaint on file. Rheumatoid arthritis and high risk prescription including restarting Orencia IV.  History of Present Illness: [name] is a 75 y.o. female   Last seen in our office on 08/07/2015. Patient is hurting all over especially her bilateral trapezius muscles her left shoulder joint bilateral elbows bilateral wrists bilateral hands. I believe she is having a flare of her rheumatoid arthritis. Note that she had in the past 12 teeth extracted and was taken off Orencia. Unfortunately she was never told to restart Orencia. As a result until 11/12/2015 she had been off of a Orencia since about March 2017. As a result of this she had a flare of her rheumatoid arthritis and were just now recently restarting her Orencia to bring her rheumatoid arthritis under better control.  She is doing well with her allopurinol overall. She is taking 3 mg daily and controlling her gout well.  She is asking for refill of Lyrica samples. She is having financial struggles and she is hoping that the samples are available to help her out some. She takes 1 pill in the morning and 2 pills at night.    Activities of Daily Living:  Patient reports morning stiffness for 60 minutes.   Patient Reports nocturnal pain.  Difficulty dressing/grooming: Reports Difficulty climbing stairs: Reports Difficulty getting out of chair: Reports Difficulty using hands for taps, buttons, cutlery, and/or writing: Reports   Review of Systems  Constitutional: Negative for fatigue.  HENT: Negative for mouth sores and mouth dryness.   Eyes: Negative for dryness.  Respiratory: Negative for shortness of breath.   Gastrointestinal: Negative for constipation and  diarrhea.  Musculoskeletal: Negative for myalgias and myalgias.  Skin: Negative for sensitivity to sunlight.  Psychiatric/Behavioral: Negative for decreased concentration and sleep disturbance.    PMFS History:  Patient Active Problem List   Diagnosis Date Noted  . Encounter for therapeutic drug monitoring 11/02/2015  . Bronchiectasis without complication ([place]) 93/81/0175  . Chronic gout involving toe without tophus 10/30/2015  . High risk medication use 10/29/2015  . Gout 10/29/2015  . H/O total knee replacement, left 10/29/2015  . History of positive PPD 10/29/2015  . Diabetes mellitus, type II, insulin dependent ([place]) 10/18/2015  . Rheumatoid arthritis flare ([place]) 10/18/2015  . AKI (acute kidney injury) ([place]) 10/18/2015  . CKD (chronic kidney disease) stage 3, GFR 30-59 ml/min 10/18/2015  . Nodule of left lung 10/18/2015  . Neuropathy ([place]) 08/22/2014  . Hypothyroidism 08/22/2014  . Chronic pain 08/22/2014  . 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 Chest pain 08/05/2013  . Aortic stenosis 12/22/2012  . Hyperlipidemia 12/22/2012  . PVC (premature ventricular contraction) 12/22/2012  . HTN (hypertension) 12/22/2012  . Open angle with borderline findings, high risk 12/15/2011  . Cataract, nuclear 05/25/2011    Past Medical History:  Diagnosis Date  . Allergic rhinitis   . Anemia   . Aortic stenosis 12/22/2012   Mild 3/14-2.35 m/s peak velocity  . CHF (congestive heart failure) ([place])   . Chronic pain   . CKD (chronic kidney disease), stage III   .  Diabetes mellitus   . Diverticulosis   . DVT (deep venous thrombosis) ([place])   . Glaucoma   . Hemorrhoids    internal  . HTN (hypertension)   . Hypercholesteremia   . Hyperlipidemia 12/22/2012  . Hypothyroidism   . Meningioma ([place]) 2008  . Osteoarthritis   . Peripheral neuropathy ([place])   . PPD positive    6 months ago  . PVC (premature ventricular contraction)  12/22/2012  . Rheumatoid arthritis(714.0)   . Sickle cell trait ([place])   . 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 Hypotension Mother   . CVA Mother   . Diabetes Mother   . Hypertension Father   . Kidney failure Father    Past Surgical History:  Procedure Laterality Date  . ABDOMINAL HYSTERECTOMY  1978   TAH,& BSO 1  . fistula repair  03/2010   partial colectomy  . intracranial surgery  11/2007   removal of meningioma  . JOINT REPLACEMENT  2001   left TKR  . LEFT HEART CATHETERIZATION WITH CORONARY ANGIOGRAM N/A 08/08/2013   Procedure: LEFT HEART CATHETERIZATION WITH CORONARY ANGIOGRAM;  Surgeon: Peter M Martinique, MD;  Location: [facility] CATH LAB;  Service: Cardiovascular;  Laterality: N/A;  . ROTATOR CUFF REPAIR  1999   BIlateral  . SIGMOIDECTOMY  2012  . TONSILLECTOMY AND ADENOIDECTOMY    . TOTAL THYROIDECTOMY  1992  . wrist sx     right    Social History   Social History Narrative   Daycare Provider     Objective: Vital Signs: BP (!) 146/69 (BP Location: Left Arm, Patient Position: Sitting, Cuff Size: Large)   Pulse 91   Resp 16   Ht 5\' 3"  (1.6 m)   Wt 198 lb (89.8 kg)   BMI 35.07 kg/m    Physical Exam  Constitutional: She is oriented to person, place, and time. She appears well-developed and well-nourished.  HENT:  Head: Normocephalic and atraumatic.  Eyes: EOM are normal. Pupils are equal, round, and reactive to light.  Cardiovascular: Normal rate, regular rhythm and normal heart sounds.  Exam reveals no gallop and no friction rub.   No murmur heard. Pulmonary/Chest: Effort normal and breath sounds normal. She has no wheezes. She has no rales.  Abdominal: Soft. Bowel sounds are normal. She exhibits no distension. There is no tenderness. There is no guarding. No hernia.  Musculoskeletal: Normal range of motion. She exhibits no edema, tenderness or deformity.    Lymphadenopathy:    She has no cervical adenopathy.  Neurological: She is alert and oriented to person, place, and time. Coordination normal.  Skin: Skin is warm and dry. Capillary refill takes less than 2 seconds. No rash noted.  Psychiatric: She has a normal mood and affect. Her behavior is normal.  Nursing note and vitals reviewed.    Musculoskeletal Exam:  Decreased range of motion of left shoulder joint otherwise good range of motion of right shoulder joint Good range of other motions except she does have pain that she is doing the range of motion. Grip strength is equal and strong bilaterally Fiber myalgia tender points are all absent  CDAI Exam: CDAI Homunculus Exam:   Tenderness:  LUE: glenohumeral Right hand: 1st MCP, 2nd MCP and 3rd MCP Left hand: 1st MCP, 2nd MCP and 3rd MCP  Swelling:  Right hand: 1st MCP, 2nd MCP and  3rd MCP Left hand: 1st MCP, 2nd MCP and 3rd MCP  Joint Counts:  CDAI Tender Joint count: 7 CDAI Swollen Joint count: 6   Synovitis noted to first second and third MCP joint bilaterally.  Investigation: No additional findings. Patient is getting labs done at the time of her infusion of Orencia. Her last labs were from 11/12/2015. Sleep apnea Epic for full details.  Labs from 08/07/2015 were reviewed. CMP with GFR is normal except for creatinine elevated at 1.42 and GFR decreased to 41 CBC with differential is normal except for mild anemia  Uric acid is elevated at 10.6  Imaging: No results found.  Speciality Comments: No specialty comments available.    Procedures:  No procedures performed Allergies: Methotrexate derivatives; Other; Penicillins; Enbrel [etanercept]; Remicade [infliximab]; and Zocor [simvastatin]   Assessment / Plan:     Visit Diagnoses: Rheumatoid arthritis flare ([place])  High risk medications (not anticoagulants) long-term use - ORENCIA IV (restarted nov 6); 2nd infusions scheduled for dec 4;   Trapezius muscle  spasm  Acute pain of left shoulder  Rheumatoid arthritis, involving unspecified site, unspecified rheumatoid factor presence (HCC) - Plan: predniSONE (DELTASONE) 5 MG tablet   Prescription for prednisone taper 5 mg; 4 pills for 4 days, 3 for 4 days, 2 for 4 days, one for 4 days, half for 4 days, stop. Dispense 42 pills She had the same taper back in 08/07/2015 and did well. She is hurting all over and I believe she is flaring from her RA and she would benefit from this prednisone taper until enough time has passed with Orencia IV infusions. Since the last was 11/12/2015 and the next one is 12/10/2015 she will still need another month or 2 of enough Orencia in her system to start having a positive effect on her RA flares.  Refill allopurinol 3 mg daily 90 day supply with 1 refill  Sample of Lyrica 75 mg 1 in the morning and 2 in the evening dispense 84 pills samples.  Patient gets labs done at her infusions. Return to clinic in 3 months note at that visit I will order uric acid on the next infusion labs to monitor her uric acid levels  Patient is getting prednisone and we will increase her sugar levels. I've asked her to discuss this with her PCP to get her sugar levels better controlled. Patient is on Humulin 70/30 and knows how to make adjustments. But she will coordinate with her PCP if needed.  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  Orders: No orders of the defined types were placed in this encounter.  Meds ordered this encounter  Medications  . pregabalin (LYRICA) 75 MG capsule    Sig: 1 pill in AM  &  2 at night    Dispense:  84 capsule    Refill:  0    Order Specific Question:   Supervising Provider    Answer:   Bo Merino [phone]    Order Specific Question:   Lot Number?    Answer:   [id number]    Order Specific Question:   Expiration Date?    Answer:    11/06/2017    Order Specific Question:   Quantity    Answer:   6  . predniSONE (DELTASONE) 5 MG tablet    Sig: 4 tablets for 4 days, 3 for 4 days, 2  for 4 days, 1 for 4 days and half for 4 days.    Dispense:  42 tablet    Refill:  0    Order Specific Question:   Supervising Provider    Answer:   Bo Merino [[id number]]  . allopurinol (ZYLOPRIM) 300 MG tablet    Sig: Take 1 tablet (300 mg total) by mouth daily.    Dispense:  30 tablet    Refill:  5    Order Specific Question:   Supervising Provider    Answer:   Bo Merino [phone]    Face-to-face time spent with patient was 30 minutes. 50% of time was spent in counseling and coordination of care.  Follow-Up Instructions: Return in about 3 months (around 03/05/2016) for Rheumatoid arthritis.   Eliezer Lofts, PA-C   I examined and evaluated the patient with Eliezer Lofts PA. The plan of care was discussed as noted above.  Bo Merino, MD

## 2015-12-06 ENCOUNTER — Other Ambulatory Visit (INDEPENDENT_AMBULATORY_CARE_PROVIDER_SITE_OTHER): Payer: Self-pay | Admitting: Rheumatology

## 2015-12-06 DIAGNOSIS — N6489 Other specified disorders of breast: Secondary | ICD-10-CM | POA: Diagnosis not present

## 2015-12-06 NOTE — Telephone Encounter (Signed)
REQUESTING AN RX FOR VOLTRAM  Cb#:[phone]  PHARMACY: [facility]

## 2015-12-06 NOTE — Telephone Encounter (Signed)
A rx for what ?  Called no answer

## 2015-12-07 ENCOUNTER — Other Ambulatory Visit (HOSPITAL_COMMUNITY): Payer: Self-pay

## 2015-12-10 ENCOUNTER — Inpatient Hospital Stay (HOSPITAL_COMMUNITY): Admission: RE | Admit: 2015-12-10 | Payer: Medicare Other | Source: Ambulatory Visit

## 2015-12-11 MED ORDER — DICLOFENAC SODIUM 1 % TD GEL: TRANSDERMAL | 3 refills | Status: DC

## 2015-12-11 NOTE — Telephone Encounter (Signed)
Patient is requesting a refill on Volatren Gel   Last Visit: 12/04/15 Next Visit: 03/03/16 Labs: 11/12/15 C/w previous labs  Okay to refill Voltaren Gel?

## 2015-12-11 NOTE — Addendum Note (Signed)
Addended by: Carole Binning on: 12/11/2015 10:43 AM   Modules accepted: Orders

## 2015-12-12 ENCOUNTER — Other Ambulatory Visit (HOSPITAL_COMMUNITY): Payer: Self-pay | Admitting: *Deleted

## 2015-12-12 ENCOUNTER — Institutional Professional Consult (permissible substitution): Payer: Medicare Other | Admitting: Pulmonary Disease

## 2015-12-12 ENCOUNTER — Other Ambulatory Visit: Payer: Self-pay | Admitting: Radiology

## 2015-12-12 DIAGNOSIS — M0579 Rheumatoid arthritis with rheumatoid factor of multiple sites without organ or systems involvement: Secondary | ICD-10-CM

## 2015-12-12 DIAGNOSIS — Z79899 Other long term (current) drug therapy: Secondary | ICD-10-CM

## 2015-12-13 ENCOUNTER — Ambulatory Visit (HOSPITAL_COMMUNITY)
Admission: RE | Admit: 2015-12-13 | Discharge: 2015-12-13 | Disposition: A | Discharge status: Home / Self Care | Payer: Medicare Other | Source: Ambulatory Visit | Attending: Rheumatology | Admitting: Rheumatology

## 2015-12-13 DIAGNOSIS — M0579 Rheumatoid arthritis with rheumatoid factor of multiple sites without organ or systems involvement: Secondary | ICD-10-CM | POA: Diagnosis not present

## 2015-12-13 DIAGNOSIS — Z79899 Other long term (current) drug therapy: Secondary | ICD-10-CM

## 2015-12-13 LAB — COMPREHENSIVE METABOLIC PANEL
ALBUMIN: 3.9 g/dL (ref 3.5–5.0)
ALT: 15 U/L (ref 14–54)
AST: 25 U/L (ref 15–41)
Alkaline Phosphatase: 101 U/L (ref 38–126)
Anion gap: 9 (ref 5–15)
BUN: 21 mg/dL — AB (ref 6–20)
CHLORIDE: 108 mmol/L (ref 101–111)
CO2: 22 mmol/L (ref 22–32)
Calcium: 9.6 mg/dL (ref 8.9–10.3)
Creatinine, Ser: 1.28 mg/dL — ABNORMAL HIGH (ref 0.44–1.00)
GFR calc Af Amer: 46 mL/min — ABNORMAL LOW (ref >60)
GFR, EST NON AFRICAN AMERICAN: 40 mL/min — AB (ref >60)
Glucose, Bld: 182 mg/dL — ABNORMAL HIGH (ref 65–99)
POTASSIUM: 4 mmol/L (ref 3.5–5.1)
SODIUM: 139 mmol/L (ref 135–145)
Total Bilirubin: 0.7 mg/dL (ref 0.3–1.2)
Total Protein: 7 g/dL (ref 6.5–8.1)

## 2015-12-13 LAB — CBC WITH DIFFERENTIAL/PLATELET
BAND NEUTROPHILS: 0 %
BASOS ABS: 0 10*3/uL (ref 0.0–0.1)
BASOS PCT: 0 %
Blasts: 0 %
EOS ABS: 0 10*3/uL (ref 0.0–0.7)
EOS PCT: 0 %
HCT: 32.2 % — ABNORMAL LOW (ref 36.0–46.0)
Hemoglobin: 10.9 g/dL — ABNORMAL LOW (ref 12.0–15.0)
LYMPHS ABS: 6.3 10*3/uL — AB (ref 0.7–4.0)
Lymphocytes Relative: 43 %
MCH: 28.2 pg (ref 26.0–34.0)
MCHC: 33.9 g/dL (ref 30.0–36.0)
MCV: 83.4 fL (ref 78.0–100.0)
METAMYELOCYTES PCT: 0 %
MONO ABS: 1.3 10*3/uL — AB (ref 0.1–1.0)
MONOS PCT: 9 %
MYELOCYTES: 0 %
NEUTROS ABS: 7.1 10*3/uL (ref 1.7–7.7)
Neutrophils Relative %: 48 %
Other: 0 %
Platelets: 333 10*3/uL (ref 150–400)
Promyelocytes Absolute: 0 %
RBC: 3.86 MIL/uL — ABNORMAL LOW (ref 3.87–5.11)
RDW: 16 % — AB (ref 11.5–15.5)
WBC: 14.7 10*3/uL — ABNORMAL HIGH (ref 4.0–10.5)
nRBC: 0 /100 WBC

## 2015-12-13 MED ORDER — DIPHENHYDRAMINE HCL 25 MG PO CAPS: 25.0000 mg | ORAL_CAPSULE | Freq: Once | ORAL | Status: DC

## 2015-12-13 MED ORDER — ACETAMINOPHEN 325 MG PO TABS: 650.0000 mg | ORAL_TABLET | Freq: Once | ORAL | Status: DC

## 2015-12-13 MED ORDER — ABATACEPT 250 MG IV SOLR
750.0000 mg | INTRAVENOUS | Status: DC
  Administered 2015-12-13: 750 mg via INTRAVENOUS
  Filled 2015-12-13: qty 30

## 2015-12-13 NOTE — Progress Notes (Signed)
Labs stable

## 2015-12-17 LAB — PATHOLOGIST SMEAR REVIEW

## 2015-12-18 ENCOUNTER — Telehealth: Payer: Self-pay | Admitting: Rheumatology

## 2015-12-18 NOTE — Progress Notes (Signed)
Creatinine is stable, WBC high due to prednisone usage

## 2015-12-18 NOTE — Telephone Encounter (Signed)
Patient called about lab results. Please call patient.

## 2015-12-18 NOTE — Telephone Encounter (Signed)
Patient advised of lab results

## 2015-12-20 DIAGNOSIS — I10 Essential (primary) hypertension: Secondary | ICD-10-CM | POA: Diagnosis not present

## 2015-12-20 DIAGNOSIS — M79622 Pain in left upper arm: Secondary | ICD-10-CM | POA: Diagnosis not present

## 2015-12-20 DIAGNOSIS — E782 Mixed hyperlipidemia: Secondary | ICD-10-CM | POA: Diagnosis not present

## 2015-12-25 DIAGNOSIS — S46012A Strain of muscle(s) and tendon(s) of the rotator cuff of left shoulder, initial encounter: Secondary | ICD-10-CM | POA: Diagnosis not present

## 2015-12-29 DIAGNOSIS — M25511 Pain in right shoulder: Secondary | ICD-10-CM | POA: Diagnosis not present

## 2016-01-02 DIAGNOSIS — B078 Other viral warts: Secondary | ICD-10-CM | POA: Diagnosis not present

## 2016-01-02 DIAGNOSIS — D485 Neoplasm of uncertain behavior of skin: Secondary | ICD-10-CM | POA: Diagnosis not present

## 2016-01-07 DIAGNOSIS — H269 Unspecified cataract: Secondary | ICD-10-CM

## 2016-01-07 HISTORY — PX: TOE SURGERY: SHX1073

## 2016-01-07 HISTORY — DX: Unspecified cataract: H26.9

## 2016-01-08 ENCOUNTER — Encounter: Payer: Self-pay | Admitting: Sports Medicine

## 2016-01-08 ENCOUNTER — Ambulatory Visit (INDEPENDENT_AMBULATORY_CARE_PROVIDER_SITE_OTHER): Payer: Self-pay | Admitting: Sports Medicine

## 2016-01-08 DIAGNOSIS — B351 Tinea unguium: Secondary | ICD-10-CM

## 2016-01-08 DIAGNOSIS — E1140 Type 2 diabetes mellitus with diabetic neuropathy, unspecified: Secondary | ICD-10-CM

## 2016-01-08 DIAGNOSIS — M19071 Primary osteoarthritis, right ankle and foot: Secondary | ICD-10-CM

## 2016-01-08 DIAGNOSIS — M2040 Other hammer toe(s) (acquired), unspecified foot: Secondary | ICD-10-CM

## 2016-01-08 DIAGNOSIS — M2142 Flat foot [pes planus] (acquired), left foot: Secondary | ICD-10-CM

## 2016-01-08 DIAGNOSIS — Z794 Long term (current) use of insulin: Secondary | ICD-10-CM

## 2016-01-08 DIAGNOSIS — M2141 Flat foot [pes planus] (acquired), right foot: Secondary | ICD-10-CM

## 2016-01-08 DIAGNOSIS — L84 Corns and callosities: Secondary | ICD-10-CM

## 2016-01-08 DIAGNOSIS — M79676 Pain in unspecified toe(s): Secondary | ICD-10-CM

## 2016-01-08 NOTE — Progress Notes (Signed)
Patient ID: [name], female   DOB: 1940-03-01, 76 y.o.   MRN: [id number]  Subjective: [name] is a 76 y.o. female patient with history of type 2 diabetes who returns to office today complaining of long, painful nails and callus while ambulating in shoes; unable to trim. Patient states that the glucose reading this morning was not recorded but good. Patient denies any new changes in medication or new problems. Patient denies any new cramping, numbness, burning or tingling in the legs. Desires new diabetic shoes.   Patient Active Problem List   Diagnosis Date Noted  . Encounter for therapeutic drug monitoring 11/02/2015  . Bronchiectasis without complication ([place]) 00/86/7619  . Chronic gout involving toe without tophus 10/30/2015  . High risk medication use 10/29/2015  . Gout 10/29/2015  . H/O total knee replacement, left 10/29/2015  . History of positive PPD 10/29/2015  . Diabetes mellitus, type II, insulin dependent ([place]) 10/18/2015  . Rheumatoid arthritis flare ([place]) 10/18/2015  . AKI (acute kidney injury) ([place]) 10/18/2015  . CKD (chronic kidney disease) stage 3, GFR 30-59 ml/min 10/18/2015  . Nodule of left lung 10/18/2015  . Neuropathy ([place]) 08/22/2014  . Hypothyroidism 08/22/2014  . Chronic pain 08/22/2014  . Chronic congestive heart failure with left ventricular diastolic dysfunction ([place]) 08/08/2013  . Chest pain 08/05/2013  . Aortic stenosis 12/22/2012  . Hyperlipidemia 12/22/2012  . PVC (premature ventricular contraction) 12/22/2012  . HTN (hypertension) 12/22/2012  . Open angle with borderline findings, high risk 12/15/2011  . Cataract, nuclear 05/25/2011   Current Outpatient Prescriptions on File Prior to Visit  Medication Sig Dispense Refill  . acetaminophen (TYLENOL) 500 MG tablet Take 1,000 mg by mouth every 6 (six) hours as needed for mild pain.    Marland Kitchen allopurinol (ZYLOPRIM) 300 MG tablet Take 1 tablet (300 mg total) by mouth daily. 30 tablet 5  .  aspirin EC 81 MG EC tablet Take 1 tablet (81 mg total) by mouth daily. 30 tablet 0  . atorvastatin (LIPITOR) 40 MG tablet Take 40 mg by mouth daily at 6 PM.     . BD INSULIN SYRINGE ULTRAFINE 31G X 15/64" 0.3 ML MISC     . cholecalciferol (VITAMIN D) 1000 UNITS tablet Take 1,000 Units by mouth daily.     . cyclobenzaprine (FLEXERIL) 10 MG tablet Take 10 mg by mouth 3 (three) times daily as needed for muscle spasms.     . diclofenac sodium (VOLTAREN) 1 % GEL 3 g to 3 larges joints up to three times daily 1 Tube 3  . ferrous sulfate (FEOSOL) 325 (65 FE) MG tablet Take 325-650 mg by mouth daily with breakfast.    . folic acid (FOLVITE) 1 MG tablet Take 1 mg by mouth daily.    Marland Kitchen HYDROcodone-acetaminophen (NORCO/VICODIN) 5-325 MG per tablet Take 1-2 tablets by mouth every 3 (three) hours as needed for moderate pain. 30 tablet 0  . insulin NPH-regular Human (NOVOLIN 70/30) (70-30) 100 UNIT/ML injection Inject 6-8 Units into the skin 3 (three) times daily with meals. Sliding source    . levothyroxine (SYNTHROID, LEVOTHROID) 137 MCG tablet Take 137 mcg by mouth daily before breakfast.    . losartan (COZAAR) 50 MG tablet Take 50 mg by mouth every evening.    . metoprolol tartrate (LOPRESSOR) 25 MG tablet Take 1 tablet (25 mg total) by mouth 2 (two) times daily as needed. (Patient taking differently: Take 25 mg by mouth daily. ) 60 tablet 6  . multivitamin (THERAGRAN)  per tablet Take 1 tablet by mouth daily.     . predniSONE (DELTASONE) 5 MG tablet 4 tablets for 4 days, 3 for 4 days, 2 for 4 days, 1 for 4 days and half for 4 days. 42 tablet 0  . pregabalin (LYRICA) 75 MG capsule 1 pill in AM  &  2 at night 84 capsule 0   No current facility-administered medications on file prior to visit.    Allergies  Allergen Reactions  . Methotrexate Derivatives Other (See Comments)    Elevated creat. Levels  . Other Other (See Comments)    NO Blood products.  [name]- causes low blood sugar (pt states she was  taking medication but cant remember what it was for.)  . Penicillins Rash    Has patient had a PCN reaction causing immediate rash, facial/tongue/throat swelling, SOB or lightheadedness with hypotension: Yes Has patient had a PCN reaction causing severe rash involving mucus membranes or skin necrosis: Yes  Has patient had a PCN reaction that required hospitalization No Has patient had a PCN reaction occurring within the last 10 years: No If all of the above answers are "NO", then may proceed with Cephalosporin use.   . Enbrel [Etanercept] Other (See Comments)    Inadequate response    . Remicade [Infliximab] Other (See Comments)    Inadequate response   . Zocor [Simvastatin] Other (See Comments)    Leg cramps     Recent Results (from the past 2160 hour(s))  CBG monitoring, ED     Status: None   Collection Time: 10/18/15  3:33 PM  Result Value Ref Range   Glucose-Capillary 67 65 - 99 mg/dL  I-stat troponin, ED     Status: None   Collection Time: 10/18/15  4:34 PM  Result Value Ref Range   Troponin i, poc 0.00 0.00 - 0.08 ng/mL   Comment 3            Comment: Due to the release kinetics of cTnI, a negative result within the first hours of the onset of symptoms does not rule out myocardial infarction with certainty. If myocardial infarction is still suspected, repeat the test at appropriate intervals.   Basic metabolic panel     Status: Abnormal   Collection Time: 10/18/15  5:10 PM  Result Value Ref Range   Sodium 138 135 - 145 mmol/L   Potassium 3.7 3.5 - 5.1 mmol/L   Chloride 107 101 - 111 mmol/L   CO2 24 22 - 32 mmol/L   Glucose, Bld 96 65 - 99 mg/dL   BUN 25 (H) 6 - 20 mg/dL   Creatinine, Ser 1.78 (H) 0.44 - 1.00 mg/dL   Calcium 9.2 8.9 - 10.3 mg/dL   GFR calc non Af Amer 27 (L) >60 mL/min   GFR calc Af Amer 31 (L) >60 mL/min    Comment: (NOTE) The eGFR has been calculated using the CKD EPI equation. This calculation has not been validated in all clinical  situations. eGFR's persistently <60 mL/min signify possible Chronic Kidney Disease.    Anion gap 7 5 - 15  CBC     Status: Abnormal   Collection Time: 10/18/15  5:10 PM  Result Value Ref Range   WBC 10.1 4.0 - 10.5 K/uL   RBC 3.77 (L) 3.87 - 5.11 MIL/uL   Hemoglobin 10.4 (L) 12.0 - 15.0 g/dL   HCT 31.2 (L) 36.0 - 46.0 %   MCV 82.8 78.0 - 100.0 fL   MCH 27.6 26.0 -  34.0 pg   MCHC 33.3 30.0 - 36.0 g/dL   RDW 15.0 11.5 - 15.5 %   Platelets 312 150 - 400 K/uL  I-Stat Troponin, ED (not at [facility])     Status: None   Collection Time: 10/18/15  9:56 PM  Result Value Ref Range   Troponin i, poc 0.02 0.00 - 0.08 ng/mL   Comment 3            Comment: Due to the release kinetics of cTnI, a negative result within the first hours of the onset of symptoms does not rule out myocardial infarction with certainty. If myocardial infarction is still suspected, repeat the test at appropriate intervals.   Troponin I     Status: None   Collection Time: 10/18/15 11:37 PM  Result Value Ref Range   Troponin I <0.03 <0.03 ng/mL  Brain natriuretic peptide     Status: None   Collection Time: 10/18/15 11:37 PM  Result Value Ref Range   B Natriuretic Peptide 46.4 0.0 - 100.0 pg/mL  Hemoglobin A1c     Status: Abnormal   Collection Time: 10/18/15 11:37 PM  Result Value Ref Range   Hgb A1c MFr Bld 6.6 (H) 4.8 - 5.6 %    Comment: (NOTE)         Pre-diabetes: 5.7 - 6.4         Diabetes: >6.4         Glycemic control for adults with diabetes: <7.0    Mean Plasma Glucose 143 mg/dL    Comment: (NOTE) Performed At: [facility] [place], [place] [id number] Lindon Romp MD [id number]   Glucose, capillary     Status: Abnormal   Collection Time: 10/19/15 12:41 AM  Result Value Ref Range   Glucose-Capillary 112 (H) 65 - 99 mg/dL  Troponin I     Status: None   Collection Time: 10/19/15  6:00 AM  Result Value Ref Range   Troponin I <0.03 <0.03 ng/mL  Basic metabolic panel      Status: Abnormal   Collection Time: 10/19/15  6:00 AM  Result Value Ref Range   Sodium 139 135 - 145 mmol/L   Potassium 3.7 3.5 - 5.1 mmol/L   Chloride 108 101 - 111 mmol/L   CO2 22 22 - 32 mmol/L   Glucose, Bld 165 (H) 65 - 99 mg/dL   BUN 23 (H) 6 - 20 mg/dL   Creatinine, Ser 1.50 (H) 0.44 - 1.00 mg/dL   Calcium 9.0 8.9 - 10.3 mg/dL   GFR calc non Af Amer 33 (L) >60 mL/min   GFR calc Af Amer 38 (L) >60 mL/min    Comment: (NOTE) The eGFR has been calculated using the CKD EPI equation. This calculation has not been validated in all clinical situations. eGFR's persistently <60 mL/min signify possible Chronic Kidney Disease.    Anion gap 9 5 - 15  Glucose, capillary     Status: Abnormal   Collection Time: 10/19/15  6:20 AM  Result Value Ref Range   Glucose-Capillary 156 (H) 65 - 99 mg/dL   Comment 1 Notify RN    Comment 2 Document in Chart   Troponin I     Status: None   Collection Time: 10/19/15 11:00 AM  Result Value Ref Range   Troponin I <0.03 <0.03 ng/mL  Glucose, capillary     Status: Abnormal   Collection Time: 10/19/15 11:26 AM  Result Value Ref Range   Glucose-Capillary 165 (H) 65 -  99 mg/dL  ECHOCARDIOGRAM COMPLETE     Status: None   Collection Time: 10/19/15  3:10 PM  Result Value Ref Range   Weight 3,028.8 oz   Height 64 in   BP 123/57 mmHg  Glucose, capillary     Status: Abnormal   Collection Time: 10/19/15  4:58 PM  Result Value Ref Range   Glucose-Capillary 129 (H) 65 - 99 mg/dL  Glucose, capillary     Status: Abnormal   Collection Time: 10/19/15  9:47 PM  Result Value Ref Range   Glucose-Capillary 131 (H) 65 - 99 mg/dL   Comment 1 Notify RN    Comment 2 Document in Chart   Glucose, capillary     Status: Abnormal   Collection Time: 10/20/15  5:27 AM  Result Value Ref Range   Glucose-Capillary 122 (H) 65 - 99 mg/dL   Comment 1 Notify RN    Comment 2 Document in Chart   Basic metabolic panel     Status: Abnormal   Collection Time: 10/20/15  5:32 AM   Result Value Ref Range   Sodium 141 135 - 145 mmol/L   Potassium 4.1 3.5 - 5.1 mmol/L   Chloride 109 101 - 111 mmol/L   CO2 24 22 - 32 mmol/L   Glucose, Bld 124 (H) 65 - 99 mg/dL   BUN 18 6 - 20 mg/dL   Creatinine, Ser 1.20 (H) 0.44 - 1.00 mg/dL   Calcium 9.3 8.9 - 10.3 mg/dL   GFR calc non Af Amer 43 (L) >60 mL/min   GFR calc Af Amer 50 (L) >60 mL/min    Comment: (NOTE) The eGFR has been calculated using the CKD EPI equation. This calculation has not been validated in all clinical situations. eGFR's persistently <60 mL/min signify possible Chronic Kidney Disease.    Anion gap 8 5 - 15  Glucose, capillary     Status: Abnormal   Collection Time: 10/20/15 10:04 AM  Result Value Ref Range   Glucose-Capillary 120 (H) 65 - 99 mg/dL   Comment 1 Notify RN   Glucose, capillary     Status: Abnormal   Collection Time: 10/20/15 11:22 AM  Result Value Ref Range   Glucose-Capillary 153 (H) 65 - 99 mg/dL   Comment 1 Notify RN   Glucose, capillary     Status: Abnormal   Collection Time: 10/20/15  4:29 PM  Result Value Ref Range   Glucose-Capillary 116 (H) 65 - 99 mg/dL   Comment 1 Notify RN   Glucose, capillary     Status: Abnormal   Collection Time: 10/20/15  8:35 PM  Result Value Ref Range   Glucose-Capillary 166 (H) 65 - 99 mg/dL  Basic metabolic panel     Status: Abnormal   Collection Time: 10/21/15  3:56 AM  Result Value Ref Range   Sodium 140 135 - 145 mmol/L   Potassium 4.2 3.5 - 5.1 mmol/L   Chloride 108 101 - 111 mmol/L   CO2 24 22 - 32 mmol/L   Glucose, Bld 131 (H) 65 - 99 mg/dL   BUN 20 6 - 20 mg/dL   Creatinine, Ser 1.16 (H) 0.44 - 1.00 mg/dL   Calcium 9.3 8.9 - 10.3 mg/dL   GFR calc non Af Amer 45 (L) >60 mL/min   GFR calc Af Amer 52 (L) >60 mL/min    Comment: (NOTE) The eGFR has been calculated using the CKD EPI equation. This calculation has not been validated in all clinical situations. eGFR's persistently <  60 mL/min signify possible Chronic Kidney Disease.     Anion gap 8 5 - 15  Glucose, capillary     Status: Abnormal   Collection Time: 10/21/15  6:08 AM  Result Value Ref Range   Glucose-Capillary 123 (H) 65 - 99 mg/dL   Comment 1 Notify RN   NM Myocar Multi W/Spect W/Wall Motion / EF     Status: None   Collection Time: 10/21/15 11:31 AM  Result Value Ref Range   Rest HR 50 bpm   Rest BP 138/64 mmHg   Percent HR 53 %   Exercise duration (min) 5 min   Estimated workload 1.0 METS   Peak HR 77 bpm   Peak BP 141/56 mmHg   MPHR 144 bpm  Glucose, capillary     Status: Abnormal   Collection Time: 10/21/15 11:39 AM  Result Value Ref Range   Glucose-Capillary 112 (H) 65 - 99 mg/dL  Uric acid     Status: Abnormal   Collection Time: 10/30/15 10:36 AM  Result Value Ref Range   Uric Acid, Serum 8.2 (H) 2.5 - 7.0 mg/dL  Nitric oxide     Status: None   Collection Time: 11/02/15 12:38 PM  Result Value Ref Range   Nitric Oxide 16   Comprehensive metabolic panel     Status: Abnormal   Collection Time: 11/12/15 12:17 PM  Result Value Ref Range   Sodium 141 135 - 145 mmol/L   Potassium 4.5 3.5 - 5.1 mmol/L   Chloride 107 101 - 111 mmol/L   CO2 25 22 - 32 mmol/L   Glucose, Bld 142 (H) 65 - 99 mg/dL   BUN 13 6 - 20 mg/dL   Creatinine, Ser 1.31 (H) 0.44 - 1.00 mg/dL   Calcium 9.6 8.9 - 10.3 mg/dL   Total Protein 6.2 (L) 6.5 - 8.1 g/dL   Albumin 3.5 3.5 - 5.0 g/dL   AST 25 15 - 41 U/L   ALT 14 14 - 54 U/L   Alkaline Phosphatase 88 38 - 126 U/L   Total Bilirubin 0.7 0.3 - 1.2 mg/dL   GFR calc non Af Amer 38 (L) >60 mL/min   GFR calc Af Amer 45 (L) >60 mL/min    Comment: (NOTE) The eGFR has been calculated using the CKD EPI equation. This calculation has not been validated in all clinical situations. eGFR's persistently <60 mL/min signify possible Chronic Kidney Disease.    Anion gap 9 5 - 15  CBC with Differential/Platelet     Status: Abnormal   Collection Time: 11/12/15 12:17 PM  Result Value Ref Range   WBC 9.4 4.0 - 10.5 K/uL   RBC  3.53 (L) 3.87 - 5.11 MIL/uL   Hemoglobin 9.6 (L) 12.0 - 15.0 g/dL   HCT 29.6 (L) 36.0 - 46.0 %   MCV 83.9 78.0 - 100.0 fL   MCH 27.2 26.0 - 34.0 pg   MCHC 32.4 30.0 - 36.0 g/dL   RDW 15.7 (H) 11.5 - 15.5 %   Platelets 286 150 - 400 K/uL   Neutrophils Relative % 38 %   Neutro Abs 3.5 1.7 - 7.7 K/uL   Lymphocytes Relative 53 %   Lymphs Abs 4.9 (H) 0.7 - 4.0 K/uL   Monocytes Relative 7 %   Monocytes Absolute 0.7 0.1 - 1.0 K/uL   Eosinophils Relative 2 %   Eosinophils Absolute 0.2 0.0 - 0.7 K/uL   Basophils Relative 0 %   Basophils Absolute 0.0 0.0 - 0.1  K/uL  CBC with Differential/Platelet     Status: Abnormal   Collection Time: 12/13/15 12:12 PM  Result Value Ref Range   WBC 14.7 (H) 4.0 - 10.5 K/uL   RBC 3.86 (L) 3.87 - 5.11 MIL/uL   Hemoglobin 10.9 (L) 12.0 - 15.0 g/dL   HCT 32.2 (L) 36.0 - 46.0 %   MCV 83.4 78.0 - 100.0 fL   MCH 28.2 26.0 - 34.0 pg   MCHC 33.9 30.0 - 36.0 g/dL   RDW 16.0 (H) 11.5 - 15.5 %   Platelets 333 150 - 400 K/uL   Neutrophils Relative % 48 %   Lymphocytes Relative 43 %   Monocytes Relative 9 %   Eosinophils Relative 0 %   Basophils Relative 0 %   Band Neutrophils 0 %   Metamyelocytes Relative 0 %   Myelocytes 0 %   Promyelocytes Absolute 0 %   Blasts 0 %   nRBC 0 0 /100 WBC   Other 0 %   Neutro Abs 7.1 1.7 - 7.7 K/uL   Lymphs Abs 6.3 (H) 0.7 - 4.0 K/uL   Monocytes Absolute 1.3 (H) 0.1 - 1.0 K/uL   Eosinophils Absolute 0.0 0.0 - 0.7 K/uL   Basophils Absolute 0.0 0.0 - 0.1 K/uL   WBC Morphology ATYPICAL LYMPHOCYTES   Comprehensive metabolic panel     Status: Abnormal   Collection Time: 12/13/15 12:12 PM  Result Value Ref Range   Sodium 139 135 - 145 mmol/L   Potassium 4.0 3.5 - 5.1 mmol/L   Chloride 108 101 - 111 mmol/L   CO2 22 22 - 32 mmol/L   Glucose, Bld 182 (H) 65 - 99 mg/dL   BUN 21 (H) 6 - 20 mg/dL   Creatinine, Ser 1.28 (H) 0.44 - 1.00 mg/dL   Calcium 9.6 8.9 - 10.3 mg/dL   Total Protein 7.0 6.5 - 8.1 g/dL   Albumin 3.9 3.5 -  5.0 g/dL   AST 25 15 - 41 U/L   ALT 15 14 - 54 U/L   Alkaline Phosphatase 101 38 - 126 U/L   Total Bilirubin 0.7 0.3 - 1.2 mg/dL   GFR calc non Af Amer 40 (L) >60 mL/min   GFR calc Af Amer 46 (L) >60 mL/min    Comment: (NOTE) The eGFR has been calculated using the CKD EPI equation. This calculation has not been validated in all clinical situations. eGFR's persistently <60 mL/min signify possible Chronic Kidney Disease.    Anion gap 9 5 - 15  Pathologist smear review     Status: None   Collection Time: 12/13/15 12:12 PM  Result Value Ref Range   Path Review LEU4     Comment: Reviewed by Chrystie Nose. Saralyn Pilar, M.D. [phone] ABSOLUTE LYMPHOCYTOSIS Reviewed by Chrystie Nose. Saralyn Pilar, M.D. [phone]     Objective: General: Patient is awake, alert, and oriented x 3 and in no acute distress.  Integument: Skin is warm, dry and supple bilateral. Nails are tender, long, thickened and dystrophic with subungual debris, consistent with onychomycosis, 1-5 bilateral. No open lesions. + callus/ preulcerative lesions present left plantar forefoot and at right>left 4th toe distal tuft (mostly involved), with no signs of infection. Remaining integument unremarkable.  Vasculature:  Dorsalis Pedis pulse 1/4 bilateral. Posterior Tibial pulse  1/4 bilateral. Capillary fill time <3 sec 1-5 bilateral. Scant hair growth to the level of the digits.Temperature gradient within normal limits. No varicosities present bilateral. No edema present bilateral.   Neurology: The patient has diminished  sensation measured with a 5.07/10g Semmes Weinstein Monofilament at all pedal sites bilateral . Vibratory sensation diminished bilateral with tuning fork. No Babinski sign present bilateral.   Musculoskeletal: Bunion and hammertoe pedal deformities noted bilateral. Pes planus foot type. Muscular strength 5/5 in all lower extremity muscular groups bilateral without pain on range of motion . No tenderness with calf compression  bilateral.  Assessment and Plan: Problem List Items Addressed This Visit    None    Visit Diagnoses    Dermatophytosis of nail    -  Primary   Corns and callosities       Hammer toe, unspecified laterality       Type 2 diabetes mellitus with diabetic neuropathy, with long-term current use of insulin (HCC)       Pain of toe, unspecified laterality       Pes planus of both feet       Osteoarthritis of ankle and foot, right         -Examined patient. -Discussed and educated patient on diabetic foot care, especially with  regards to the vascular, neurological and musculoskeletal systems.  -Stressed the importance of good glycemic control and the detriment of not  controlling glucose levels in relation to the foot. -Mechanically debrided all nails 1-5 bilateral using sterile nail nipper and filed with dremel without incident  -Debrided callus at right and left 4th toe and left plantar forefoot using sterile chisel blade without incident. [employer] step diabetic shoe order form was completed; office to contact primary care for approval / certification;  Office to arrange shoe fitting and dispensing. -Encouraged patient if continues to be bothersome to consider hammertoe surgery -Continue with crest pad -Answered all patient questions -Patient to return as in 2.5 months for at risk foot care -Patient advised to call the office if any problems or questions arise in the meantime.  Landis Martins, DPM

## 2016-01-11 ENCOUNTER — Other Ambulatory Visit (HOSPITAL_COMMUNITY): Payer: Self-pay | Admitting: *Deleted

## 2016-01-14 ENCOUNTER — Ambulatory Visit (HOSPITAL_COMMUNITY)
Admission: RE | Admit: 2016-01-14 | Discharge: 2016-01-14 | Disposition: A | Discharge status: Home / Self Care | Payer: Medicare Other | Source: Ambulatory Visit | Attending: Rheumatology | Admitting: Rheumatology

## 2016-01-14 DIAGNOSIS — M0579 Rheumatoid arthritis with rheumatoid factor of multiple sites without organ or systems involvement: Secondary | ICD-10-CM | POA: Insufficient documentation

## 2016-01-14 LAB — CBC WITH DIFFERENTIAL/PLATELET
BASOS PCT: 0 %
Basophils Absolute: 0 10*3/uL (ref 0.0–0.1)
EOS ABS: 0.1 10*3/uL (ref 0.0–0.7)
Eosinophils Relative: 2 %
HCT: 32.5 % — ABNORMAL LOW (ref 36.0–46.0)
Hemoglobin: 10.8 g/dL — ABNORMAL LOW (ref 12.0–15.0)
LYMPHS ABS: 3 10*3/uL (ref 0.7–4.0)
Lymphocytes Relative: 35 %
MCH: 27.8 pg (ref 26.0–34.0)
MCHC: 33.2 g/dL (ref 30.0–36.0)
MCV: 83.8 fL (ref 78.0–100.0)
Monocytes Absolute: 0.4 10*3/uL (ref 0.1–1.0)
Monocytes Relative: 5 %
Neutro Abs: 5.1 10*3/uL (ref 1.7–7.7)
Neutrophils Relative %: 58 %
PLATELETS: 302 10*3/uL (ref 150–400)
RBC: 3.88 MIL/uL (ref 3.87–5.11)
RDW: 17.4 % — ABNORMAL HIGH (ref 11.5–15.5)
WBC: 8.7 10*3/uL (ref 4.0–10.5)

## 2016-01-14 LAB — COMPREHENSIVE METABOLIC PANEL
ALT: 16 U/L (ref 14–54)
ANION GAP: 11 (ref 5–15)
AST: 28 U/L (ref 15–41)
Albumin: 4 g/dL (ref 3.5–5.0)
Alkaline Phosphatase: 74 U/L (ref 38–126)
BUN: 16 mg/dL (ref 6–20)
CHLORIDE: 107 mmol/L (ref 101–111)
CO2: 21 mmol/L — AB (ref 22–32)
Calcium: 10.1 mg/dL (ref 8.9–10.3)
Creatinine, Ser: 1.48 mg/dL — ABNORMAL HIGH (ref 0.44–1.00)
GFR calc non Af Amer: 33 mL/min — ABNORMAL LOW (ref >60)
GFR, EST AFRICAN AMERICAN: 38 mL/min — AB (ref >60)
Glucose, Bld: 163 mg/dL — ABNORMAL HIGH (ref 65–99)
Potassium: 4.5 mmol/L (ref 3.5–5.1)
SODIUM: 139 mmol/L (ref 135–145)
Total Bilirubin: 0.6 mg/dL (ref 0.3–1.2)
Total Protein: 7.6 g/dL (ref 6.5–8.1)

## 2016-01-14 MED ORDER — SODIUM CHLORIDE 0.9 % IV SOLN
750.0000 mg | INTRAVENOUS | Status: DC
  Administered 2016-01-14: 13:00:00 750 mg via INTRAVENOUS
  Filled 2016-01-14: qty 20

## 2016-01-14 NOTE — Progress Notes (Signed)
Spoke with representative from [place] to verify patient does not need a prior authorization for her [facility]. Amado Coe. States that as long as the service is outpatient it will not require and authorization.   Seniya Stoffers, [place], CPhT 8:24 AM

## 2016-01-15 NOTE — Progress Notes (Signed)
GFR is stable. May reduce Arava to 10 mg by mouth every other day

## 2016-01-17 ENCOUNTER — Ambulatory Visit (INDEPENDENT_AMBULATORY_CARE_PROVIDER_SITE_OTHER): Payer: Medicare Other | Admitting: Rheumatology

## 2016-01-17 ENCOUNTER — Telehealth: Payer: Self-pay | Admitting: *Deleted

## 2016-01-17 ENCOUNTER — Encounter: Payer: Self-pay | Admitting: Rheumatology

## 2016-01-17 VITALS — BP 136/80 | HR 76 | Wt 198.0 lb

## 2016-01-17 DIAGNOSIS — M25531 Pain in right wrist: Secondary | ICD-10-CM

## 2016-01-17 DIAGNOSIS — M25512 Pain in left shoulder: Secondary | ICD-10-CM | POA: Diagnosis not present

## 2016-01-17 DIAGNOSIS — M25532 Pain in left wrist: Secondary | ICD-10-CM

## 2016-01-17 DIAGNOSIS — M0579 Rheumatoid arthritis with rheumatoid factor of multiple sites without organ or systems involvement: Secondary | ICD-10-CM | POA: Insufficient documentation

## 2016-01-17 DIAGNOSIS — M79641 Pain in right hand: Secondary | ICD-10-CM

## 2016-01-17 DIAGNOSIS — M1A9XX0 Chronic gout, unspecified, without tophus (tophi): Secondary | ICD-10-CM

## 2016-01-17 DIAGNOSIS — M069 Rheumatoid arthritis, unspecified: Secondary | ICD-10-CM | POA: Diagnosis not present

## 2016-01-17 DIAGNOSIS — Z79899 Other long term (current) drug therapy: Secondary | ICD-10-CM | POA: Diagnosis not present

## 2016-01-17 DIAGNOSIS — M25521 Pain in right elbow: Secondary | ICD-10-CM | POA: Diagnosis not present

## 2016-01-17 DIAGNOSIS — M79642 Pain in left hand: Secondary | ICD-10-CM

## 2016-01-17 MED ORDER — TRAMADOL HCL 50 MG PO TABS: 50.0000 mg | ORAL_TABLET | Freq: Two times a day (BID) | ORAL | 0 refills | Status: DC

## 2016-01-17 MED ORDER — PREDNISONE 5 MG PO TABS: ORAL_TABLET | ORAL | 0 refills | Status: DC

## 2016-01-17 NOTE — Telephone Encounter (Signed)
Patient states her thumb on her right is swollen as well as her knuckle. Patient states she has had this going for a couple of days. Patient would like to know what she should do.

## 2016-01-17 NOTE — Telephone Encounter (Signed)
Please sch appt

## 2016-01-17 NOTE — Telephone Encounter (Signed)
Patient advised she needs to schedule an appointment.

## 2016-01-17 NOTE — Progress Notes (Signed)
Will discuss at fu

## 2016-01-17 NOTE — Progress Notes (Signed)
Office Visit Note  Patient: [name]             Date of Birth: 1940/08/21           MRN: [id number]             PCP: Lilian Coma, MD Referring: Jonathon Jordan, MD Visit Date: 01/17/2016 Occupation: @GUAROCC @    Subjective:  Pain of the Right Hand and Pain of the Left Shoulder   History of Present Illness: [name] is a 76 y.o. female  Last seen in October. Please see Epic for notes from that day when she saw Dr. Estanislado Pandy.  Lately she's been having a flare to her right wrist right elbow right hand and left hand. She has not missed any doses of her medication (orencia IV).   She also has gout. She's taking allopurinol every day as prescribed.  The last time she took prednisone taper was back in August 2017 and did well. She is reluctant to take prednisone again but there is not much option at this time.  She has not had any flare of her gout.  Activities of Daily Living:  Patient reports morning stiffness for 60 minutes.   Patient Reports nocturnal pain.  Difficulty dressing/grooming: Reports Difficulty climbing stairs: Reports Difficulty getting out of chair: Reports Difficulty using hands for taps, buttons, cutlery, and/or writing: Reports   Review of Systems  Constitutional: Negative for fatigue.  HENT: Negative for mouth sores and mouth dryness.   Eyes: Negative for dryness.  Respiratory: Negative for shortness of breath.   Gastrointestinal: Negative for constipation and diarrhea.  Musculoskeletal: Positive for arthralgias, joint pain, joint swelling and morning stiffness. Negative for myalgias and myalgias.  Skin: Negative for sensitivity to sunlight.  Psychiatric/Behavioral: Negative for decreased concentration and sleep disturbance.    PMFS History:  Patient Active Problem List   Diagnosis Date Noted  . Encounter for therapeutic drug monitoring 11/02/2015  . Bronchiectasis without complication ([place]) 10/62/6948  . Chronic gout  involving toe without tophus 10/30/2015  . High risk medication use 10/29/2015  . Gout 10/29/2015  . H/O total knee replacement, left 10/29/2015  . History of positive PPD 10/29/2015  . Diabetes mellitus, type II, insulin dependent ([place]) 10/18/2015  . Rheumatoid arthritis flare ([place]) 10/18/2015  . AKI (acute kidney injury) ([place]) 10/18/2015  . CKD (chronic kidney disease) stage 3, GFR 30-59 ml/min 10/18/2015  . Nodule of left lung 10/18/2015  . Neuropathy ([place]) 08/22/2014  . Hypothyroidism 08/22/2014  . Chronic pain 08/22/2014  . Chronic congestive heart failure with left ventricular diastolic dysfunction ([place]) 08/08/2013  . Chest pain 08/05/2013  . Aortic stenosis 12/22/2012  . Hyperlipidemia 12/22/2012  . PVC (premature ventricular contraction) 12/22/2012  . HTN (hypertension) 12/22/2012  . Open angle with borderline findings, high risk 12/15/2011  . Cataract, nuclear 05/25/2011    Past Medical History:  Diagnosis Date  . Allergic rhinitis   . Anemia   . Aortic stenosis 12/22/2012   Mild 3/14-2.35 m/s peak velocity  . CHF (congestive heart failure) ([place])   . Chronic pain   . CKD (chronic kidney disease), stage III   . Diabetes mellitus   . Diverticulosis   . DVT (deep venous thrombosis) ([place])   . Glaucoma   . Hemorrhoids    internal  . HTN (hypertension)   . Hypercholesteremia   . Hyperlipidemia 12/22/2012  . Hypothyroidism   . Meningioma ([place]) 2008  . Osteoarthritis   . Peripheral neuropathy ([place])   .  PPD positive    6 months ago  . PVC (premature ventricular contraction) 12/22/2012  . Rheumatoid arthritis(714.0)   . Sickle cell trait ([place])   . 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 Hypotension Mother   . CVA Mother   . Diabetes Mother   . Hypertension Father   . Kidney failure Father    Past Surgical History:  Procedure Laterality Date  . ABDOMINAL  HYSTERECTOMY  1978   TAH,& BSO 1  . fistula repair  03/2010   partial colectomy  . intracranial surgery  11/2007   removal of meningioma  . JOINT REPLACEMENT  2001   left TKR  . LEFT HEART CATHETERIZATION WITH CORONARY ANGIOGRAM N/A 08/08/2013   Procedure: LEFT HEART CATHETERIZATION WITH CORONARY ANGIOGRAM;  Surgeon: Peter M Martinique, MD;  Location[facility] CATH LAB;  Service: Cardiovascular;  Laterality: N/A;  . ROTATOR CUFF REPAIR  1999   BIlateral  . SIGMOIDECTOMY  2012  . TONSILLECTOMY AND ADENOIDECTOMY    . TOTAL THYROIDECTOMY  1992  . wrist sx     right    Social History   Social History Narrative   Daycare Provider     Objective: Vital Signs: BP 136/80   Pulse 76   Wt 198 lb (89.8 kg)   LMP  (Exact Date)   BMI 33.99 kg/m    Physical Exam  Constitutional: She is oriented to person, place, and time. She appears well-developed and well-nourished.  HENT:  Head: Normocephalic and atraumatic.  Eyes: EOM are normal. Pupils are equal, round, and reactive to light.  Cardiovascular: Normal rate, regular rhythm and normal heart sounds.  Exam reveals no gallop and no friction rub.   No murmur heard. Pulmonary/Chest: Effort normal and breath sounds normal. She has no wheezes. She has no rales.  Abdominal: Soft. Bowel sounds are normal. She exhibits no distension. There is no tenderness. There is no guarding. No hernia.  Musculoskeletal: Normal range of motion. She exhibits no edema, tenderness or deformity.  Lymphadenopathy:    She has no cervical adenopathy.  Neurological: She is alert and oriented to person, place, and time. Coordination normal.  Skin: Skin is warm and dry. Capillary refill takes less than 2 seconds. No rash noted.  Psychiatric: She has a normal mood and affect. Her behavior is normal.  Nursing note and vitals reviewed.    Musculoskeletal Exam:  Decreased range of motion of bilateral shoulders. 90 of abduction like on last visit and significantly less range of  motion of the left. Decreased range of motion of bilateral knees Decreased range of motion of bilateral wrists. Decreased grip strength bilaterally Fiber myalgia tender points are all absent  CDAI Exam: CDAI Homunculus Exam:   Tenderness:  RUE: ulnohumeral and radiohumeral and wrist LUE: wrist Right hand: 1st MCP, 2nd MCP, 3rd MCP and 1st PIP Left hand: 2nd MCP and 3rd MCP  Swelling:  RUE: ulnohumeral and radiohumeral and wrist LUE: wrist Right hand: 1st MCP, 2nd MCP, 3rd MCP and 1st PIP Left hand: 2nd MCP and 3rd MCP  Joint Counts:  CDAI Tender Joint count: 9 CDAI Swollen Joint count: 9  Global Assessments:  Patient Global Assessment: 10 Provider Global Assessment: 10  CDAI Calculated Score: 38    Investigation: No additional findings.   Imaging: No results found.  Speciality Comments: No specialty comments available.  Procedures:  No procedures performed Allergies: Methotrexate derivatives; Other; Penicillins; Enbrel [etanercept]; Remicade [infliximab]; and Zocor [simvastatin]   Assessment / Plan:     Visit Diagnoses: Rheumatoid arthritis, involving unspecified site, unspecified rheumatoid factor presence ([place]) - Plan: predniSONE (DELTASONE) 5 MG tablet   Plan: #1: Patient has been compliant with her medications but she is flared. Flare started approximately Tuesday morning. We wanted her to be on Orencia IV but historically, she discontinued that medication for some period of time due to dental work. Then she restarted the medication at her August 2017 visit.  #2: We want her to be on a rate, but she states that she found it too expensive to afford. On today's visit I spoke with Amy who reminded me that the patient stated that $10 a month was too expensive. At next follow-up visit in February, we can remind the patient that if she can afford tender vitals are month, we can perhaps avoid flares and keep her more comfortable, avoid prednisone exposure, and  all the other risk associated with prednisone.  #3: Prednisone taper, 5 mg, 4 pills every morning for 4 days, 3 pills for 4 days, 2 pills for 4 days, 1 pill for 4 days, after 4 days, then stop. Patient is aware that she needs to monitor her blood sugar and coordinate that with her PCP as needed  #4: Return to clinic mid-February as scheduled for follow-up visit.  #5: Note that she needs yearly chest x-ray and that'll be due April. Note that she had finished her INH in the past  #6: Patient has a history of gout. Well controlled. Using allopurinol currently. She is having left great toe pain but it looks as if there is a bunion there. She also has poor fitting shoes. She has callus on her left toe PIP joint. Currently she is seeing her doctor for fitting of diabetic shoes. That should be happening sometime in the next one or 2 weeks. I've encouraged the patient the importance of getting this done probably as soon as she is able to.  #7: Patient was having significant pain from her RA flare and I offered her tramadol to help her with her pain. I later learned that she is also on hydrocodone prescribed by Dr. Stephanie Acre, her PCP. I advised the patient to use her hydrocodone as prescribed by Dr. Stephanie Acre during this flare however to use it sparingly. She should get refills from her PCP. I threw away her tramadol prescription and cancel it from her record. Since Dr. Stephanie Acre is on Epic she'll be able to read this note and provide adequate pain relief if and when appropriate.  Orders: No orders of the defined types were placed in this encounter.  Meds ordered this encounter  Medications  . predniSONE (DELTASONE) 5 MG tablet    Sig: 4 tablets for 4 days, 3 for 4 days, 2 for 4 days, 1 for 4 days and half for 4 days.    Dispense:  42 tablet    Refill:  0    Order Specific Question:   Supervising Provider    Answer:   Bo Merino [[id number]]  . DISCONTD: traMADol (ULTRAM) 50 MG tablet    Sig: Take 1  tablet (50 mg total) by mouth 2 (two) times daily.    Dispense:  60 tablet    Refill:  0    Order Specific Question:   Supervising Provider    Answer:   Bo Merino [phone]    Face-to-face  time spent with patient was 30 minutes. 50% of time was spent in counseling and coordination of care.  Follow-Up Instructions: Return for RA, orencia iv, hand pain, ra flare, elbow pain.   Eliezer Lofts, PA-C  Note - This record has been created using [employer].  Chart creation errors have been sought, but may not always  have been located. Such creation errors do not reflect on  the standard of medical care.

## 2016-01-18 DIAGNOSIS — L03011 Cellulitis of right finger: Secondary | ICD-10-CM | POA: Diagnosis not present

## 2016-01-22 DIAGNOSIS — M25511 Pain in right shoulder: Secondary | ICD-10-CM | POA: Diagnosis not present

## 2016-01-28 DIAGNOSIS — M545 Low back pain: Secondary | ICD-10-CM | POA: Diagnosis not present

## 2016-01-28 DIAGNOSIS — M542 Cervicalgia: Secondary | ICD-10-CM | POA: Diagnosis not present

## 2016-01-28 DIAGNOSIS — M5417 Radiculopathy, lumbosacral region: Secondary | ICD-10-CM | POA: Diagnosis not present

## 2016-01-28 DIAGNOSIS — M5126 Other intervertebral disc displacement, lumbar region: Secondary | ICD-10-CM | POA: Diagnosis not present

## 2016-01-28 DIAGNOSIS — M5412 Radiculopathy, cervical region: Secondary | ICD-10-CM | POA: Diagnosis not present

## 2016-01-31 ENCOUNTER — Other Ambulatory Visit: Payer: Self-pay | Admitting: Radiology

## 2016-01-31 DIAGNOSIS — Z79899 Other long term (current) drug therapy: Secondary | ICD-10-CM

## 2016-01-31 DIAGNOSIS — E039 Hypothyroidism, unspecified: Secondary | ICD-10-CM | POA: Diagnosis not present

## 2016-02-01 DIAGNOSIS — M5412 Radiculopathy, cervical region: Secondary | ICD-10-CM | POA: Diagnosis not present

## 2016-02-01 DIAGNOSIS — M542 Cervicalgia: Secondary | ICD-10-CM | POA: Diagnosis not present

## 2016-02-04 ENCOUNTER — Ambulatory Visit (INDEPENDENT_AMBULATORY_CARE_PROVIDER_SITE_OTHER): Payer: Medicare Other | Admitting: Cardiology

## 2016-02-04 ENCOUNTER — Other Ambulatory Visit: Payer: Self-pay | Admitting: Family Medicine

## 2016-02-04 ENCOUNTER — Ambulatory Visit
Admission: RE | Admit: 2016-02-04 | Discharge: 2016-02-04 | Disposition: A | Discharge status: Home / Self Care | Payer: Medicare Other | Source: Ambulatory Visit | Attending: Family Medicine | Admitting: Family Medicine

## 2016-02-04 ENCOUNTER — Encounter: Payer: Self-pay | Admitting: Cardiology

## 2016-02-04 VITALS — BP 118/72 | HR 68 | Ht 64.0 in | Wt 187.0 lb

## 2016-02-04 DIAGNOSIS — R0781 Pleurodynia: Secondary | ICD-10-CM | POA: Diagnosis not present

## 2016-02-04 DIAGNOSIS — R079 Chest pain, unspecified: Secondary | ICD-10-CM | POA: Diagnosis not present

## 2016-02-04 DIAGNOSIS — S40022A Contusion of left upper arm, initial encounter: Secondary | ICD-10-CM

## 2016-02-04 DIAGNOSIS — I493 Ventricular premature depolarization: Secondary | ICD-10-CM | POA: Diagnosis not present

## 2016-02-04 DIAGNOSIS — I350 Nonrheumatic aortic (valve) stenosis: Secondary | ICD-10-CM

## 2016-02-04 DIAGNOSIS — M79622 Pain in left upper arm: Secondary | ICD-10-CM | POA: Diagnosis not present

## 2016-02-04 DIAGNOSIS — E7800 Pure hypercholesterolemia, unspecified: Secondary | ICD-10-CM

## 2016-02-04 DIAGNOSIS — I10 Essential (primary) hypertension: Secondary | ICD-10-CM

## 2016-02-04 NOTE — Patient Instructions (Signed)

## 2016-02-04 NOTE — Progress Notes (Signed)
[facility]. [address], [address] [place], [place]  [address] Phone: [phone] Fax:  [phone]  Date:  02/04/2016   ID:  [name], DOB [birth year], MRN [id number]  PCP:  Lilian Coma, MD   History of Present Illness: [name] is a 76 y.o. female with history of carotid artery disease, with bilateral ICA stenosis of up to 40%, mild aortic insufficiency/stenosis, diastolic dysfunction and type 2 diabetes. Had heart catheterization on 08/08/13 which was normal. EF 70%, mild aortic stenosis on echocardiogram EKG - nonspecific ST-T wave changes. Patient has fairly good functional status. Previously she had an episode of syncope where she was standing in the kitchen/working felt dizzy fell then returned to consciousness as she hit the floor. currently she describes no significant warning. She woke up and was in her usual state. She did need some help getting up off of the floor. No diaphoresis. She is a Restaurant manager, fast food. Hemoglobin 12.0, creatinine 1.36.   05/07/15-feels like heart racing and stops. PVCs previously diagnosed on event monitor. No syncope. No chest pain. DVT 2015 now off Xarelto. Right arm DVT.   Cardiac catheterization 08/08/13-normal coronary arteries  Event monitor 2/14 showed no adverse arrhythmias.PVC's  Nuclear stress test: 03/05/12-low risk, no ischemia, normal EF  Echocardiogram: 08/08/13 - EF 70%, mild aortic stenosis-  Fell on left shoulder. Bruise left upper arm. She caught her foot on a table and had a mechanical fall. No syncope. No chest pain. Her left neck hurt slightly.    Wt Readings from Last 3 Encounters:  02/04/16 187 lb (84.8 kg)  01/17/16 198 lb (89.8 kg)  01/14/16 198 lb (89.8 kg)     Past Medical History:  Diagnosis Date  . Allergic rhinitis   . Anemia   . Aortic stenosis 12/22/2012   Mild 3/14-2.35 m/s peak velocity  . CHF (congestive heart failure) ([place])   . Chronic pain   . CKD (chronic kidney disease), stage III   .  Diabetes mellitus   . Diverticulosis   . DVT (deep venous thrombosis) ([place])   . Glaucoma   . Hemorrhoids    internal  . HTN (hypertension)   . Hypercholesteremia   . Hyperlipidemia 12/22/2012  . Hypothyroidism   . Meningioma ([place]) 2008  . Osteoarthritis   . Peripheral neuropathy ([place])   . PPD positive    6 months ago  . PVC (premature ventricular contraction) 12/22/2012  . Rheumatoid arthritis(714.0)   . Sickle cell trait ([place])   . 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 ABDOMINAL HYSTERECTOMY  1978   TAH,& BSO 1  . fistula repair  03/2010   partial colectomy  . intracranial surgery  11/2007   removal of meningioma  . JOINT REPLACEMENT  2001   left TKR  . LEFT HEART CATHETERIZATION WITH CORONARY ANGIOGRAM N/A 08/08/2013   Procedure: LEFT HEART CATHETERIZATION WITH CORONARY ANGIOGRAM;  Surgeon: Peter M Martinique, MD;  Location: [facility] C[facility] LAB;  Service: Cardiovascular;  Laterality: N/A;  . ROTATOR CUFF REPAIR  1999   BIlateral  . SIGMOIDECTOMY  2012  . TONSILLECTOMY AND ADENOIDECTOMY    . TOTAL THYROIDECTOMY  1992  . wrist sx     right     Current Outpatient Prescriptions  Medication Sig Dispense Refill  . Abatacept (ORENCIA IV) Inject into the vein.    Marland Kitchen  acetaminophen (TYLENOL) 500 MG tablet Take 1,000 mg by mouth every 6 (six) hours as needed for mild pain.    Marland Kitchen allopurinol (ZYLOPRIM) 300 MG tablet Take 1 tablet (300 mg total) by mouth daily. 30 tablet 5  . aspirin EC 81 MG EC tablet Take 1 tablet (81 mg total) by mouth daily. 30 tablet 0  . atorvastatin (LIPITOR) 40 MG tablet Take 40 mg by mouth daily at 6 PM.     . BD INSULIN SYRINGE ULTRAFINE 31G X 15/64" 0.3 ML MISC     . cholecalciferol (VITAMIN D) 1000 UNITS tablet Take 1,000 Units by mouth daily.     . cyclobenzaprine (FLEXERIL) 10 MG tablet Take 10 mg by mouth at bedtime.     . diclofenac sodium (VOLTAREN)  1 % GEL 3 g to 3 larges joints up to three times daily 1 Tube 3  . ferrous sulfate (FEOSOL) 325 (65 FE) MG tablet Take 325-650 mg by mouth daily with breakfast.    . folic acid (FOLVITE) 1 MG tablet Take 1 mg by mouth daily.    Marland Kitchen HYDROcodone-acetaminophen (NORCO/VICODIN) 5-325 MG per tablet Take 1-2 tablets by mouth every 3 (three) hours as needed for moderate pain. 30 tablet 0  . insulin NPH-regular Human (NOVOLIN 70/30) (70-30) 100 UNIT/ML injection Inject 6-8 Units into the skin 3 (three) times daily with meals. Sliding source    . levothyroxine (SYNTHROID, LEVOTHROID) 137 MCG tablet Take 137 mcg by mouth daily before breakfast.    . losartan (COZAAR) 50 MG tablet Take 50 mg by mouth every evening.    . metoprolol tartrate (LOPRESSOR) 25 MG tablet Take 1 tablet (25 mg total) by mouth 2 (two) times daily as needed. (Patient taking differently: Take 25 mg by mouth daily. ) 60 tablet 6  . multivitamin (THERAGRAN) per tablet Take 1 tablet by mouth daily.     . predniSONE (DELTASONE) 5 MG tablet 4 tablets for 4 days, 3 for 4 days, 2 for 4 days, 1 for 4 days and half for 4 days. 42 tablet 0  . pregabalin (LYRICA) 75 MG capsule 1 pill in AM  &  2 at night 84 capsule 0   No current facility-administered medications for this visit.    She is not currently on Xarelto.  Allergies:    Allergies  Allergen Reactions  . Methotrexate Derivatives Other (See Comments)    Elevated creat. Levels  . Other Other (See Comments)    NO Blood products.  [name]- causes low blood sugar (pt states she was taking medication but cant remember what it was for.)  . Penicillins Rash    Has patient had a PCN reaction causing immediate rash, facial/tongue/throat swelling, SOB or lightheadedness with hypotension: Yes Has patient had a PCN reaction causing severe rash involving mucus membranes or skin necrosis: Yes  Has patient had a PCN reaction that required hospitalization No Has patient had a PCN reaction  occurring within the last 10 years: No If all of the above answers are "NO", then may proceed with Cephalosporin use.   . Enbrel [Etanercept] Other (See Comments)    Inadequate response    . Remicade [Infliximab] Other (See Comments)    Inadequate response   . Zocor [Simvastatin] Other (See Comments)    Leg cramps     Social History:  The patient  reports that she quit smoking about 54 years ago. Her smoking use included Cigarettes. She has a 0.20 pack-year smoking history. She has never used  smokeless tobacco. She reports that she drinks alcohol. She reports that she does not use drugs.   ROS:  Please see the history of present illness.  Rash on hands.  Denies any bleeding, syncope, orthopnea, PND, no shortness of breath.    PHYSICAL EXAM: VS:  BP 118/72   Pulse 68   Ht 5\' 4"  (1.626 m)   Wt 187 lb (84.8 kg)   LMP  (LMP Unknown)   BMI 32.10 kg/m  Well nourished, well developed, in no acute distress  HEENT: normal  Neck: no JVD  Cardiac:  normal S1, S2; RRR; 2/6 systolic murmur  Lungs:  clear to auscultation bilaterally, no wheezing, rhonchi or rales  Abd: soft, nontender, no hepatomegaly  Ext: no edema Catheterization site appears normal Skin: warm and dry bruise left upper arm noted from fall Neuro: no focal abnormalities noted, speech difficulty  EKG:  None today.      ASSESSMENT AND PLAN:  1. Mild aortic stenosis-continue to monitor clinically. Exercise. Monitor blood pressure. We will consider echocardiogram the next year. 2. Hyperlipidemia-continue with atorvastatin. This is being monitored by Dr. Stephanie Acre. 3. Hypertension-currently well controlled on angiotensin receptor blocker. 4. PVCs-seen on event monitor. Benign. I will give her metoprolol 25 mg twice a day when necessary. She is still feeling at times palpitations. 5. Syncope-no further occurrences. Cardiac workup reassuring. 6. History of chest pain-cardiac catheterization reassuring.    One year follow  up.  Signed, Candee Furbish, MD [facility]  02/04/2016 2:28 PM

## 2016-02-08 DIAGNOSIS — M545 Low back pain: Secondary | ICD-10-CM | POA: Diagnosis not present

## 2016-02-08 DIAGNOSIS — M5136 Other intervertebral disc degeneration, lumbar region: Secondary | ICD-10-CM | POA: Diagnosis not present

## 2016-02-08 DIAGNOSIS — M5126 Other intervertebral disc displacement, lumbar region: Secondary | ICD-10-CM | POA: Diagnosis not present

## 2016-02-11 ENCOUNTER — Ambulatory Visit (HOSPITAL_COMMUNITY)
Admission: RE | Admit: 2016-02-11 | Discharge: 2016-02-11 | Disposition: A | Discharge status: Home / Self Care | Payer: Medicare Other | Source: Ambulatory Visit | Attending: Rheumatology | Admitting: Rheumatology

## 2016-02-11 DIAGNOSIS — Z79899 Other long term (current) drug therapy: Secondary | ICD-10-CM

## 2016-02-11 DIAGNOSIS — M0579 Rheumatoid arthritis with rheumatoid factor of multiple sites without organ or systems involvement: Secondary | ICD-10-CM | POA: Insufficient documentation

## 2016-02-11 MED ORDER — SODIUM CHLORIDE 0.9 % IV SOLN
750.0000 mg | INTRAVENOUS | Status: DC
  Administered 2016-02-11: 13:00:00 750 mg via INTRAVENOUS
  Filled 2016-02-11: qty 30

## 2016-02-11 MED ORDER — DIPHENHYDRAMINE HCL 25 MG PO CAPS: 25.0000 mg | ORAL_CAPSULE | ORAL | Status: DC

## 2016-02-11 MED ORDER — ACETAMINOPHEN 325 MG PO TABS: 650.0000 mg | ORAL_TABLET | ORAL | Status: DC

## 2016-02-18 DIAGNOSIS — B349 Viral infection, unspecified: Secondary | ICD-10-CM | POA: Diagnosis not present

## 2016-02-18 DIAGNOSIS — J029 Acute pharyngitis, unspecified: Secondary | ICD-10-CM | POA: Diagnosis not present

## 2016-02-18 DIAGNOSIS — R05 Cough: Secondary | ICD-10-CM | POA: Diagnosis not present

## 2016-02-21 NOTE — Progress Notes (Signed)
Office Visit Note  Patient: [name]             Date of Birth: 03/07/1940           MRN: [id number]             PCP: Lilian Coma, MD Referring: Jonathon Jordan, MD Visit Date: 02/25/2016 Occupation: @GUAROCC @    Subjective:  Follow-up Follow-up rheumatoid arthritis and high-risk prescription  History of Present Illness: [name] is a 76 y.o. female   With a history of rheumatoid arthritis and gout.  She is on Orencia IV every month at [facility].  Overall she is doing well except recently she fell onto a semi-broken chair and hit her left shoulder. Having fair amount of pain there as well as in her second and third MCP joint of her left hand and right hand  The hand pain started just about a week ago and she feels like she's having some flare. She describes her pain as 8 on a scale of 0-10. In January she also had a flare and we gave her some prednisone taper and it effectively address patient's flare until recently.  She also has a history of gout for which she is taking allopurinol 300 mg daily. She does need updated labs for uric acid and we will do them along with her March labs drawn during infusion   Activities of Daily Living:  Patient reports morning stiffness for 60 minutes.   Patient Reports nocturnal pain.  Difficulty dressing/grooming: Reports Difficulty climbing stairs: Reports Difficulty getting out of chair: Reports Difficulty using hands for taps, buttons, cutlery, and/or writing: Reports   No Rheumatology ROS completed.   PMFS History:  Patient Active Problem List   Diagnosis Date Noted  . Rheumatoid arthritis ([place]) 01/17/2016  . High risk medications (not anticoagulants) long-term use 01/17/2016  . Bilateral hand pain 01/17/2016  . Bilateral wrist pain 01/17/2016  . Pain in right elbow 01/17/2016  . Left shoulder pain 01/17/2016  . Encounter for therapeutic drug monitoring 11/02/2015  . Bronchiectasis without  complication ([place]) 12/75/1700  . Chronic gout involving toe without tophus 10/30/2015  . High risk medication use 10/29/2015  . Gout 10/29/2015  . H/O total knee replacement, left 10/29/2015  . History of positive PPD 10/29/2015  . Diabetes mellitus, type II, insulin dependent ([place]) 10/18/2015  . Rheumatoid arthritis flare ([place]) 10/18/2015  . AKI (acute kidney injury) ([place]) 10/18/2015  . CKD (chronic kidney disease) stage 3, GFR 30-59 ml/min 10/18/2015  . Nodule of left lung 10/18/2015  . Neuropathy ([place]) 08/22/2014  . Hypothyroidism 08/22/2014  . Chronic pain 08/22/2014  . Chronic congestive heart failure with left ventricular diastolic dysfunction ([place]) 08/08/2013  . Chest pain 08/05/2013  . Aortic stenosis 12/22/2012  . Hyperlipidemia 12/22/2012  . PVC (premature ventricular contraction) 12/22/2012  . HTN (hypertension) 12/22/2012  . Open angle with borderline findings, high risk 12/15/2011  . Cataract, nuclear 05/25/2011    Past Medical History:  Diagnosis Date  . Allergic rhinitis   . Anemia   . Aortic stenosis 12/22/2012   Mild 3/14-2.35 m/s peak velocity  . CHF (congestive heart failure) ([place])   . Chronic pain   . CKD (chronic kidney disease), stage III   . Diabetes mellitus   . Diverticulosis   . DVT (deep venous thrombosis) ([place])   . Glaucoma   . Hemorrhoids    internal  . HTN (hypertension)   . Hypercholesteremia   . Hyperlipidemia  12/22/2012  . Hypothyroidism   . Meningioma ([place]) 2008  . Osteoarthritis   . Peripheral neuropathy ([place])   . PPD positive    6 months ago  . PVC (premature ventricular contraction) 12/22/2012  . Rheumatoid arthritis(714.0)   . Sickle cell trait ([place])   . 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 Hypotension Mother   . CVA Mother   . Diabetes Mother   . Hypertension Father   . Kidney failure Father    Past Surgical  History:  Procedure Laterality Date  . ABDOMINAL HYSTERECTOMY  1978   TAH,& BSO 1  . fistula repair  03/2010   partial colectomy  . intracranial surgery  11/2007   removal of meningioma  . JOINT REPLACEMENT  2001   left TKR  . LEFT HEART CATHETERIZATION WITH CORONARY ANGIOGRAM N/A 08/08/2013   Procedure: LEFT HEART CATHETERIZATION WITH CORONARY ANGIOGRAM;  Surgeon: Peter M Martinique, MD;  Location: [facility] CATH LAB;  Service: Cardiovascular;  Laterality: N/A;  . ROTATOR CUFF REPAIR  1999   BIlateral  . SIGMOIDECTOMY  2012  . TONSILLECTOMY AND ADENOIDECTOMY    . TOTAL THYROIDECTOMY  1992  . wrist sx     right    Social History   Social History Narrative   Daycare Provider     Objective: Vital Signs: BP 130/72   Pulse 68   Resp 12   Ht 5' 4"  (1.626 m)   Wt 186 lb (84.4 kg)   LMP  (LMP Unknown)   BMI 31.93 kg/m    Physical Exam   Musculoskeletal Exam:  Full range of motion of all joints but with some difficulty Grip strength is equal and strong bilaterally Fibromyalgia tender points are all absent  CDAI Exam: CDAI Homunculus Exam:   Tenderness:  Right hand: 2nd MCP and 3rd MCP Left hand: 2nd MCP and 3rd MCP  Swelling:  Right hand: 2nd MCP and 3rd MCP Left hand: 2nd MCP and 3rd MCP  Joint Counts:  CDAI Tender Joint count: 4 CDAI Swollen Joint count: 4  Global Assessments:  Patient Global Assessment: 8 Provider Global Assessment: 8  CDAI Calculated Score: 24 Mild synovitis to bilateral second and third MCP joint. Patient rates her discomfort as 8 on a scale of 0-10   Investigation: Findings:  Chest x-ray due in April  Labs from 05/09/2015 show CBC with diff is normal except for hemoglobin at 9.3, hematocrit at 29.2.  CMP with GFR is normal.  Rapid-3 shows a score of 15.3 with an index of 5 which is consistent with high severity.   Hospital Outpatient Visit on 01/14/2016  Component Date Value Ref Range Status  . WBC 01/14/2016 8.7  4.0 - 10.5 K/uL Final  .  RBC 01/14/2016 3.88  3.87 - 5.11 MIL/uL Final  . Hemoglobin 01/14/2016 10.8* 12.0 - 15.0 g/dL Final  . HCT 01/14/2016 32.5* 36.0 - 46.0 % Final  . MCV 01/14/2016 83.8  78.0 - 100.0 fL Final  . MCH 01/14/2016 27.8  26.0 - 34.0 pg Final  . MCHC 01/14/2016 33.2  30.0 - 36.0 g/dL Final  . RDW 01/14/2016 17.4* 11.5 - 15.5 % Final  . Platelets 01/14/2016 302  150 - 400 K/uL Final  . Neutrophils Relative % 01/14/2016 58  % Final  . Neutro Abs 01/14/2016 5.1  1.7 - 7.7 K/uL Final  .  Lymphocytes Relative 01/14/2016 35  % Final  . Lymphs Abs 01/14/2016 3.0  0.7 - 4.0 K/uL Final  . Monocytes Relative 01/14/2016 5  % Final  . Monocytes Absolute 01/14/2016 0.4  0.1 - 1.0 K/uL Final  . Eosinophils Relative 01/14/2016 2  % Final  . Eosinophils Absolute 01/14/2016 0.1  0.0 - 0.7 K/uL Final  . Basophils Relative 01/14/2016 0  % Final  . Basophils Absolute 01/14/2016 0.0  0.0 - 0.1 K/uL Final  . Sodium 01/14/2016 139  135 - 145 mmol/L Final  . Potassium 01/14/2016 4.5  3.5 - 5.1 mmol/L Final  . Chloride 01/14/2016 107  101 - 111 mmol/L Final  . CO2 01/14/2016 21* 22 - 32 mmol/L Final  . Glucose, Bld 01/14/2016 163* 65 - 99 mg/dL Final  . BUN 01/14/2016 16  6 - 20 mg/dL Final  . Creatinine, Ser 01/14/2016 1.48* 0.44 - 1.00 mg/dL Final  . Calcium 01/14/2016 10.1  8.9 - 10.3 mg/dL Final  . Total Protein 01/14/2016 7.6  6.5 - 8.1 g/dL Final  . Albumin 01/14/2016 4.0  3.5 - 5.0 g/dL Final  . AST 01/14/2016 28  15 - 41 U/L Final  . ALT 01/14/2016 16  14 - 54 U/L Final  . Alkaline Phosphatase 01/14/2016 74  38 - 126 U/L Final  . Total Bilirubin 01/14/2016 0.6  0.3 - 1.2 mg/dL Final  . GFR calc non Af Amer 01/14/2016 33* >60 mL/min Final  . GFR calc Af Amer 01/14/2016 38* >60 mL/min Final   Comment: (NOTE) The eGFR has been calculated using the CKD EPI equation. This calculation has not been validated in all clinical situations. eGFR's persistently <60 mL/min signify possible Chronic Kidney Disease.     Georgiann Hahn gap 01/14/2016 11  5 - 15 Final  Hospital Outpatient Visit on 12/13/2015  Component Date Value Ref Range Status  . WBC 12/13/2015 14.7* 4.0 - 10.5 K/uL Final  . RBC 12/13/2015 3.86* 3.87 - 5.11 MIL/uL Final  . Hemoglobin 12/13/2015 10.9* 12.0 - 15.0 g/dL Final  . HCT 12/13/2015 32.2* 36.0 - 46.0 % Final  . MCV 12/13/2015 83.4  78.0 - 100.0 fL Final  . MCH 12/13/2015 28.2  26.0 - 34.0 pg Final  . MCHC 12/13/2015 33.9  30.0 - 36.0 g/dL Final  . RDW 12/13/2015 16.0* 11.5 - 15.5 % Final  . Platelets 12/13/2015 333  150 - 400 K/uL Final  . Neutrophils Relative % 12/13/2015 48  % Final  . Lymphocytes Relative 12/13/2015 43  % Final  . Monocytes Relative 12/13/2015 9  % Final  . Eosinophils Relative 12/13/2015 0  % Final  . Basophils Relative 12/13/2015 0  % Final  . Band Neutrophils 12/13/2015 0  % Final  . Metamyelocytes Relative 12/13/2015 0  % Final  . Myelocytes 12/13/2015 0  % Final  . Promyelocytes Absolute 12/13/2015 0  % Final  . Blasts 12/13/2015 0  % Final  . nRBC 12/13/2015 0  0 /100 WBC Final  . Other 12/13/2015 0  % Corrected  . Neutro Abs 12/13/2015 7.1  1.7 - 7.7 K/uL Final  . Lymphs Abs 12/13/2015 6.3* 0.7 - 4.0 K/uL Final  . Monocytes Absolute 12/13/2015 1.3* 0.1 - 1.0 K/uL Final  . Eosinophils Absolute 12/13/2015 0.0  0.0 - 0.7 K/uL Final  . Basophils Absolute 12/13/2015 0.0  0.0 - 0.1 K/uL Final  . WBC Morphology 12/13/2015 ATYPICAL LYMPHOCYTES   Final  . Sodium 12/13/2015 139  135 - 145 mmol/L  Final  . Potassium 12/13/2015 4.0  3.5 - 5.1 mmol/L Final  . Chloride 12/13/2015 108  101 - 111 mmol/L Final  . CO2 12/13/2015 22  22 - 32 mmol/L Final  . Glucose, Bld 12/13/2015 182* 65 - 99 mg/dL Final  . BUN 12/13/2015 21* 6 - 20 mg/dL Final  . Creatinine, Ser 12/13/2015 1.28* 0.44 - 1.00 mg/dL Final  . Calcium 12/13/2015 9.6  8.9 - 10.3 mg/dL Final  . Total Protein 12/13/2015 7.0  6.5 - 8.1 g/dL Final  . Albumin 12/13/2015 3.9  3.5 - 5.0 g/dL Final  . AST  12/13/2015 25  15 - 41 U/L Final  . ALT 12/13/2015 15  14 - 54 U/L Final  . Alkaline Phosphatase 12/13/2015 101  38 - 126 U/L Final  . Total Bilirubin 12/13/2015 0.7  0.3 - 1.2 mg/dL Final  . GFR calc non Af Amer 12/13/2015 40* >60 mL/min Final  . GFR calc Af Amer 12/13/2015 46* >60 mL/min Final   Comment: (NOTE) The eGFR has been calculated using the CKD EPI equation. This calculation has not been validated in all clinical situations. eGFR's persistently <60 mL/min signify possible Chronic Kidney Disease.   . Anion gap 12/13/2015 9  5 - 15 Final  . Path Review 12/13/2015 LEU4   Final   Comment: Reviewed by Chrystie Nose. Saralyn Pilar, M.D. [phone] ABSOLUTE LYMPHOCYTOSIS Reviewed by Chrystie Nose. Saralyn Pilar, M.D. [phone]     Imaging: Dg Ribs Unilateral W/chest Left  Result Date: 02/04/2016 CLINICAL DATA:  [place] 1 week ago with left anterior rib pain. EXAM: LEFT RIBS AND CHEST - 3+ VIEW COMPARISON:  10/18/2015 FINDINGS: Cardiomediastinal silhouette is normal. The right chest is clear. There is mild atelectasis at the left lung base. There is no visible rib fracture. Cartilage injury not excluded. IMPRESSION: Mild atelectasis at the left lung base. No rib fracture seen. Cartilage injury not excluded. Electronically Signed   By: Nelson Chimes M.D.   On: 02/04/2016 15:49   Dg Humerus Left  Result Date: 02/04/2016 CLINICAL DATA:  [place] 1 week ago.  Upper arm pain. EXAM: LEFT HUMERUS - 2+ VIEW COMPARISON:  MRI 12/29/2015.  Radiography 05/30/2015. FINDINGS: No evidence of fracture or dislocation. 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 These include narrowed humeral acromial distance and laterally sloping acromion with sclerotic change. IMPRESSION: No acute finding. 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 Electronically Signed   By: Nelson Chimes M.D.   On: 02/04/2016 15:50    Speciality Comments: No specialty comments available.    Procedures:  No procedures  performed Allergies: Methotrexate derivatives; Other; Penicillins; Enbrel [etanercept]; Remicade [infliximab]; and Zocor [simvastatin]   Assessment / Plan:     Visit Diagnoses: Rheumatoid arthritis flare ([place])  Rheumatoid arthritis, involving unspecified site, unspecified rheumatoid factor presence (H[facility])  High risk medications (not anticoagulants) long-term use - Orencia IV q month;  Bilateral wrist pain  Bilateral hand pain  Essential hypertension  Chronic gout involving toe without tophus, unspecified cause, unspecified laterality - Plan: Uric acid, Uric acid  Acute pain of left shoulder  Pain in right elbow   Plan: #1: Rheumatoid arthritis. Doing well overall. Has Orencia IV once a month. Last infusion was 02/11/2016. Has labs every 2 months. Last labs were 01/14/2016 00 within normal limits for CBC with differential and CMP with GFR Patient does need updated uric acid which we will do approximately March 2018.  #2: Patient is on Orencia IV through the hospital  #  3: Gout. Takes allopurinol 300 mg daily. Needs updated uric acid. Last uric acid was October 2017  #4: [place] recently at home and hit left arm onto a broken chair. No head injury. Continues to hurt. Patient is requesting pain management for this I have offered her prednisone because of joint pain to her hands at this time and I'm agreeable. 4po qAM x 3 days, 3po qAM x 3 days, 2po qAM x 3 days, 1po qAM x 3 days,then STOP; Disp 30 pills;  #5: I have ordered a uric acid and gave the printed Rx request to the patient. She will give this to the nurse at the time of her Orencia infusion so they can draw a uric acid at the same time during the CBC with differential and CMP with GFR in early March Currently her right first and second MCP joint with some tenderness and swelling to palpation and her left hand with a second MCP joint with some swelling and tenderness to palpation. Mild. HoweverPatient rates her pain as  8 on a scale of 0-10. I'll give her short course of prednisone that are 3 days each   Orders: Orders Placed This Encounter  Procedures  . Uric acid   No orders of the defined types were placed in this encounter.   Face-to-face time spent with patient was 30 minutes. 50% of time was spent in counseling and coordination of care.  Follow-Up Instructions: Return in about 4 months (around 06/24/2016) for RA, Gout, Orencia IV, Allopur 300 qd, lt sj pian;.   Eliezer Lofts, PA-C  Note - This record has been created using [employer].  Chart creation errors have been sought, but may not always  have been located. Such creation errors do not reflect on  the standard of medical care.

## 2016-02-25 ENCOUNTER — Ambulatory Visit (INDEPENDENT_AMBULATORY_CARE_PROVIDER_SITE_OTHER): Payer: Medicare Other | Admitting: Rheumatology

## 2016-02-25 ENCOUNTER — Encounter: Payer: Self-pay | Admitting: Rheumatology

## 2016-02-25 VITALS — BP 130/72 | HR 68 | Resp 12 | Ht 64.0 in | Wt 186.0 lb

## 2016-02-25 DIAGNOSIS — M069 Rheumatoid arthritis, unspecified: Secondary | ICD-10-CM | POA: Diagnosis not present

## 2016-02-25 DIAGNOSIS — I10 Essential (primary) hypertension: Secondary | ICD-10-CM | POA: Diagnosis not present

## 2016-02-25 DIAGNOSIS — M79641 Pain in right hand: Secondary | ICD-10-CM | POA: Diagnosis not present

## 2016-02-25 DIAGNOSIS — M79642 Pain in left hand: Secondary | ICD-10-CM

## 2016-02-25 DIAGNOSIS — Z79899 Other long term (current) drug therapy: Secondary | ICD-10-CM

## 2016-02-25 DIAGNOSIS — M25512 Pain in left shoulder: Secondary | ICD-10-CM | POA: Diagnosis not present

## 2016-02-25 DIAGNOSIS — M25532 Pain in left wrist: Secondary | ICD-10-CM | POA: Diagnosis not present

## 2016-02-25 DIAGNOSIS — M25531 Pain in right wrist: Secondary | ICD-10-CM | POA: Diagnosis not present

## 2016-02-25 DIAGNOSIS — M25521 Pain in right elbow: Secondary | ICD-10-CM

## 2016-02-25 DIAGNOSIS — M1A9XX0 Chronic gout, unspecified, without tophus (tophi): Secondary | ICD-10-CM

## 2016-02-25 MED ORDER — ALLOPURINOL 300 MG PO TABS: 300.0000 mg | ORAL_TABLET | Freq: Every day | ORAL | 5 refills | Status: DC

## 2016-02-25 MED ORDER — PREDNISONE 5 MG PO TABS: ORAL_TABLET | ORAL | 0 refills | Status: DC

## 2016-02-27 ENCOUNTER — Emergency Department (HOSPITAL_COMMUNITY): Payer: Medicare Other

## 2016-02-27 ENCOUNTER — Observation Stay (HOSPITAL_COMMUNITY): Payer: Medicare Other

## 2016-02-27 ENCOUNTER — Encounter (HOSPITAL_COMMUNITY): Payer: Self-pay | Admitting: Emergency Medicine

## 2016-02-27 ENCOUNTER — Inpatient Hospital Stay (HOSPITAL_COMMUNITY)
Admission: EM | Admit: 2016-02-27 | Discharge: 2016-02-29 | DRG: 070 | Disposition: A | Discharge status: Home Health | Payer: Medicare Other | Attending: Internal Medicine | Admitting: Internal Medicine

## 2016-02-27 DIAGNOSIS — R29700 NIHSS score 0: Secondary | ICD-10-CM | POA: Diagnosis present

## 2016-02-27 DIAGNOSIS — E7800 Pure hypercholesterolemia, unspecified: Secondary | ICD-10-CM | POA: Diagnosis present

## 2016-02-27 DIAGNOSIS — I639 Cerebral infarction, unspecified: Secondary | ICD-10-CM | POA: Diagnosis present

## 2016-02-27 DIAGNOSIS — I10 Essential (primary) hypertension: Secondary | ICD-10-CM

## 2016-02-27 DIAGNOSIS — D573 Sickle-cell trait: Secondary | ICD-10-CM | POA: Diagnosis present

## 2016-02-27 DIAGNOSIS — G9340 Encephalopathy, unspecified: Secondary | ICD-10-CM | POA: Diagnosis not present

## 2016-02-27 DIAGNOSIS — E119 Type 2 diabetes mellitus without complications: Secondary | ICD-10-CM

## 2016-02-27 DIAGNOSIS — Z79899 Other long term (current) drug therapy: Secondary | ICD-10-CM

## 2016-02-27 DIAGNOSIS — R7989 Other specified abnormal findings of blood chemistry: Secondary | ICD-10-CM

## 2016-02-27 DIAGNOSIS — Z841 Family history of disorders of kidney and ureter: Secondary | ICD-10-CM

## 2016-02-27 DIAGNOSIS — E873 Alkalosis: Secondary | ICD-10-CM | POA: Diagnosis present

## 2016-02-27 DIAGNOSIS — E1142 Type 2 diabetes mellitus with diabetic polyneuropathy: Secondary | ICD-10-CM | POA: Diagnosis present

## 2016-02-27 DIAGNOSIS — Z888 Allergy status to other drugs, medicaments and biological substances status: Secondary | ICD-10-CM

## 2016-02-27 DIAGNOSIS — R001 Bradycardia, unspecified: Secondary | ICD-10-CM | POA: Diagnosis present

## 2016-02-27 DIAGNOSIS — I130 Hypertensive heart and chronic kidney disease with heart failure and stage 1 through stage 4 chronic kidney disease, or unspecified chronic kidney disease: Secondary | ICD-10-CM | POA: Diagnosis present

## 2016-02-27 DIAGNOSIS — I350 Nonrheumatic aortic (valve) stenosis: Secondary | ICD-10-CM | POA: Diagnosis present

## 2016-02-27 DIAGNOSIS — Z833 Family history of diabetes mellitus: Secondary | ICD-10-CM

## 2016-02-27 DIAGNOSIS — Z794 Long term (current) use of insulin: Secondary | ICD-10-CM | POA: Diagnosis not present

## 2016-02-27 DIAGNOSIS — Z880 Allergy status to penicillin: Secondary | ICD-10-CM

## 2016-02-27 DIAGNOSIS — N183 Chronic kidney disease, stage 3 unspecified: Secondary | ICD-10-CM | POA: Diagnosis present

## 2016-02-27 DIAGNOSIS — G459 Transient cerebral ischemic attack, unspecified: Secondary | ICD-10-CM

## 2016-02-27 DIAGNOSIS — I6330 Cerebral infarction due to thrombosis of unspecified cerebral artery: Secondary | ICD-10-CM | POA: Insufficient documentation

## 2016-02-27 DIAGNOSIS — Z7982 Long term (current) use of aspirin: Secondary | ICD-10-CM

## 2016-02-27 DIAGNOSIS — G8929 Other chronic pain: Secondary | ICD-10-CM | POA: Diagnosis present

## 2016-02-27 DIAGNOSIS — J479 Bronchiectasis, uncomplicated: Secondary | ICD-10-CM | POA: Diagnosis not present

## 2016-02-27 DIAGNOSIS — N179 Acute kidney failure, unspecified: Secondary | ICD-10-CM | POA: Diagnosis present

## 2016-02-27 DIAGNOSIS — M109 Gout, unspecified: Secondary | ICD-10-CM | POA: Diagnosis present

## 2016-02-27 DIAGNOSIS — R569 Unspecified convulsions: Secondary | ICD-10-CM

## 2016-02-27 DIAGNOSIS — E039 Hypothyroidism, unspecified: Secondary | ICD-10-CM | POA: Diagnosis present

## 2016-02-27 DIAGNOSIS — Z90710 Acquired absence of both cervix and uterus: Secondary | ICD-10-CM

## 2016-02-27 DIAGNOSIS — Z86718 Personal history of other venous thrombosis and embolism: Secondary | ICD-10-CM

## 2016-02-27 DIAGNOSIS — M069 Rheumatoid arthritis, unspecified: Secondary | ICD-10-CM | POA: Diagnosis present

## 2016-02-27 DIAGNOSIS — Z823 Family history of stroke: Secondary | ICD-10-CM

## 2016-02-27 DIAGNOSIS — G44209 Tension-type headache, unspecified, not intractable: Secondary | ICD-10-CM | POA: Diagnosis not present

## 2016-02-27 DIAGNOSIS — R531 Weakness: Secondary | ICD-10-CM | POA: Diagnosis not present

## 2016-02-27 DIAGNOSIS — G894 Chronic pain syndrome: Secondary | ICD-10-CM

## 2016-02-27 DIAGNOSIS — I5032 Chronic diastolic (congestive) heart failure: Secondary | ICD-10-CM | POA: Diagnosis not present

## 2016-02-27 DIAGNOSIS — E890 Postprocedural hypothyroidism: Secondary | ICD-10-CM | POA: Diagnosis present

## 2016-02-27 DIAGNOSIS — Z87891 Personal history of nicotine dependence: Secondary | ICD-10-CM

## 2016-02-27 DIAGNOSIS — Z96651 Presence of right artificial knee joint: Secondary | ICD-10-CM | POA: Diagnosis present

## 2016-02-27 DIAGNOSIS — R404 Transient alteration of awareness: Secondary | ICD-10-CM | POA: Diagnosis not present

## 2016-02-27 DIAGNOSIS — Z8249 Family history of ischemic heart disease and other diseases of the circulatory system: Secondary | ICD-10-CM

## 2016-02-27 DIAGNOSIS — E785 Hyperlipidemia, unspecified: Secondary | ICD-10-CM | POA: Diagnosis present

## 2016-02-27 DIAGNOSIS — M0579 Rheumatoid arthritis with rheumatoid factor of multiple sites without organ or systems involvement: Secondary | ICD-10-CM

## 2016-02-27 DIAGNOSIS — E1122 Type 2 diabetes mellitus with diabetic chronic kidney disease: Secondary | ICD-10-CM | POA: Diagnosis present

## 2016-02-27 DIAGNOSIS — R4182 Altered mental status, unspecified: Secondary | ICD-10-CM | POA: Diagnosis not present

## 2016-02-27 DIAGNOSIS — M1990 Unspecified osteoarthritis, unspecified site: Secondary | ICD-10-CM | POA: Diagnosis present

## 2016-02-27 LAB — DIFFERENTIAL
Basophils Absolute: 0 10*3/uL (ref 0.0–0.1)
Basophils Relative: 0 %
EOS PCT: 1 %
Eosinophils Absolute: 0.1 10*3/uL (ref 0.0–0.7)
LYMPHS ABS: 2.6 10*3/uL (ref 0.7–4.0)
LYMPHS PCT: 34 %
Monocytes Absolute: 0.5 10*3/uL (ref 0.1–1.0)
Monocytes Relative: 6 %
Neutro Abs: 4.5 10*3/uL (ref 1.7–7.7)
Neutrophils Relative %: 59 %

## 2016-02-27 LAB — COMPREHENSIVE METABOLIC PANEL
ALK PHOS: 56 U/L (ref 38–126)
ALT: 13 U/L — AB (ref 14–54)
ANION GAP: 11 (ref 5–15)
AST: 16 U/L (ref 15–41)
Albumin: 4 g/dL (ref 3.5–5.0)
BILIRUBIN TOTAL: 0.7 mg/dL (ref 0.3–1.2)
BUN: 21 mg/dL — ABNORMAL HIGH (ref 6–20)
CO2: 23 mmol/L (ref 22–32)
Calcium: 9.9 mg/dL (ref 8.9–10.3)
Chloride: 106 mmol/L (ref 101–111)
Creatinine, Ser: 1.73 mg/dL — ABNORMAL HIGH (ref 0.44–1.00)
GFR calc non Af Amer: 27 mL/min — ABNORMAL LOW (ref >60)
GFR, EST AFRICAN AMERICAN: 32 mL/min — AB (ref >60)
GLUCOSE: 108 mg/dL — AB (ref 65–99)
Potassium: 4.5 mmol/L (ref 3.5–5.1)
Sodium: 140 mmol/L (ref 135–145)
TOTAL PROTEIN: 7.3 g/dL (ref 6.5–8.1)

## 2016-02-27 LAB — I-STAT CHEM 8, ED
BUN: 29 mg/dL — ABNORMAL HIGH (ref 6–20)
CREATININE: 2 mg/dL — AB (ref 0.44–1.00)
Calcium, Ion: 1.13 mmol/L — ABNORMAL LOW (ref 1.15–1.40)
Chloride: 109 mmol/L (ref 101–111)
GLUCOSE: 93 mg/dL (ref 65–99)
HCT: 34 % — ABNORMAL LOW (ref 36.0–46.0)
Hemoglobin: 11.6 g/dL — ABNORMAL LOW (ref 12.0–15.0)
POTASSIUM: 4.5 mmol/L (ref 3.5–5.1)
Sodium: 142 mmol/L (ref 135–145)
TCO2: 23 mmol/L (ref 0–100)

## 2016-02-27 LAB — CBG MONITORING, ED
GLUCOSE-CAPILLARY: 84 mg/dL (ref 65–99)
Glucose-Capillary: 91 mg/dL (ref 65–99)

## 2016-02-27 LAB — RAPID URINE DRUG SCREEN, HOSP PERFORMED
Amphetamines: NOT DETECTED
Barbiturates: NOT DETECTED
Benzodiazepines: NOT DETECTED
Cocaine: NOT DETECTED
OPIATES: POSITIVE — AB
TETRAHYDROCANNABINOL: NOT DETECTED

## 2016-02-27 LAB — URINALYSIS, ROUTINE W REFLEX MICROSCOPIC
BILIRUBIN URINE: NEGATIVE
Glucose, UA: NEGATIVE mg/dL
Hgb urine dipstick: NEGATIVE
KETONES UR: NEGATIVE mg/dL
LEUKOCYTES UA: NEGATIVE
NITRITE: NEGATIVE
PH: 5 (ref 5.0–8.0)
Protein, ur: NEGATIVE mg/dL
Specific Gravity, Urine: 1.015 (ref 1.005–1.030)

## 2016-02-27 LAB — CBC
HEMATOCRIT: 32.9 % — AB (ref 36.0–46.0)
HEMOGLOBIN: 10.8 g/dL — AB (ref 12.0–15.0)
MCH: 28.4 pg (ref 26.0–34.0)
MCHC: 32.8 g/dL (ref 30.0–36.0)
MCV: 86.6 fL (ref 78.0–100.0)
Platelets: 268 10*3/uL (ref 150–400)
RBC: 3.8 MIL/uL — AB (ref 3.87–5.11)
RDW: 16.9 % — ABNORMAL HIGH (ref 11.5–15.5)
WBC: 7.7 10*3/uL (ref 4.0–10.5)

## 2016-02-27 LAB — I-STAT TROPONIN, ED: Troponin i, poc: 0 ng/mL (ref 0.00–0.08)

## 2016-02-27 LAB — I-STAT VENOUS BLOOD GAS, ED
Acid-base deficit: 2 mmol/L (ref 0.0–2.0)
BICARBONATE: 22.1 mmol/L (ref 20.0–28.0)
O2 Saturation: 91 %
PH VEN: 7.439 — AB (ref 7.250–7.430)
PO2 VEN: 57 mmHg — AB (ref 32.0–45.0)
TCO2: 23 mmol/L (ref 0–100)
pCO2, Ven: 32.7 mmHg — ABNORMAL LOW (ref 44.0–60.0)

## 2016-02-27 LAB — T4, FREE: FREE T4: 0.74 ng/dL (ref 0.61–1.12)

## 2016-02-27 LAB — APTT: aPTT: 26 seconds (ref 24–36)

## 2016-02-27 LAB — PROTIME-INR
INR: 1.06
Prothrombin Time: 13.9 seconds (ref 11.4–15.2)

## 2016-02-27 LAB — AMMONIA: Ammonia: 9 umol/L (ref 9–35)

## 2016-02-27 LAB — D-DIMER, QUANTITATIVE: D-Dimer, Quant: 3.15 ug/mL-FEU — ABNORMAL HIGH (ref 0.00–0.50)

## 2016-02-27 LAB — TSH: TSH: 14.791 u[IU]/mL — AB (ref 0.350–4.500)

## 2016-02-27 LAB — ETHANOL: Alcohol, Ethyl (B): <5 mg/dL (ref <5)

## 2016-02-27 MED ORDER — ASPIRIN 325 MG PO TABS
325.0000 mg | ORAL_TABLET | Freq: Every day | ORAL | Status: DC
  Administered 2016-02-28 – 2016-02-29 (×2): 325 mg via ORAL
  Filled 2016-02-27 (×2): qty 1

## 2016-02-27 MED ORDER — LEVOTHYROXINE SODIUM 100 MCG IV SOLR
75.0000 ug | Freq: Every day | INTRAVENOUS | Status: DC
  Administered 2016-02-28 – 2016-02-29 (×2): 75 ug via INTRAVENOUS
  Filled 2016-02-27 (×2): qty 5

## 2016-02-27 MED ORDER — ACETAMINOPHEN 650 MG RE SUPP: 650.0000 mg | RECTAL | Status: DC | PRN

## 2016-02-27 MED ORDER — STROKE: EARLY STAGES OF RECOVERY BOOK
Freq: Once | Status: AC
  Administered 2016-02-27: 05:00:00
  Filled 2016-02-27: qty 1

## 2016-02-27 MED ORDER — ACETAMINOPHEN 160 MG/5ML PO SOLN: 650.0000 mg | ORAL | Status: DC | PRN

## 2016-02-27 MED ORDER — ACETAMINOPHEN 325 MG PO TABS
650.0000 mg | ORAL_TABLET | ORAL | Status: DC | PRN
  Administered 2016-02-28: 650 mg via ORAL
  Filled 2016-02-27: qty 2

## 2016-02-27 MED ORDER — ASPIRIN 300 MG RE SUPP: 300.0000 mg | Freq: Every day | RECTAL | Status: DC

## 2016-02-27 MED ORDER — SODIUM CHLORIDE 0.9 % IV SOLN
INTRAVENOUS | Status: DC
  Administered 2016-02-28 – 2016-02-29 (×3): via INTRAVENOUS

## 2016-02-27 MED ORDER — INSULIN ASPART 100 UNIT/ML ~~LOC~~ SOLN
0.0000 [IU] | SUBCUTANEOUS | Status: DC
  Administered 2016-02-29: 2 [IU] via SUBCUTANEOUS
  Administered 2016-02-29: 1 [IU] via SUBCUTANEOUS

## 2016-02-27 MED ORDER — ALLOPURINOL 300 MG PO TABS
300.0000 mg | ORAL_TABLET | Freq: Every day | ORAL | Status: DC
  Administered 2016-02-28 – 2016-02-29 (×2): 300 mg via ORAL
  Filled 2016-02-27 (×2): qty 1

## 2016-02-27 MED ORDER — NALOXONE HCL 0.4 MG/ML IJ SOLN
0.4000 mg | Freq: Once | INTRAMUSCULAR | Status: AC
  Administered 2016-02-27: 0.4 mg via INTRAVENOUS
  Filled 2016-02-27: qty 1

## 2016-02-27 MED ORDER — SODIUM CHLORIDE 0.9 % IV BOLUS (SEPSIS)
1000.0000 mL | Freq: Once | INTRAVENOUS | Status: AC
  Administered 2016-02-27: 1000 mL via INTRAVENOUS

## 2016-02-27 MED ORDER — LEVOTHYROXINE SODIUM 100 MCG IV SOLR
100.0000 ug | Freq: Once | INTRAVENOUS | Status: AC
  Administered 2016-02-28: 100 ug via INTRAVENOUS
  Filled 2016-02-27: qty 5

## 2016-02-27 NOTE — ED Triage Notes (Signed)
Pt arrived via [facility] as a  Code Stroke.  Pt drove herself to her friend's house and was sitting in a chair.  Pt had sudden onset of frontal headache, generalized weakness, dizziness, diaphoresis, bilateral hand weakness, and was "slow to recall."  On arrival to ED, pt c/o headache and L foot numbness.  RN, EDP, and neurohospitalist met pt at bridge and went straight to CT. Lab unable to obtain blood at bridge due to difficult stick.  EMS was also unable to get IV access.

## 2016-02-27 NOTE — ED Notes (Signed)
IV team at bedside 

## 2016-02-27 NOTE — ED Notes (Signed)
Seward Grater, RN at bedside attempting [place] IV

## 2016-02-27 NOTE — H&P (Addendum)
[name]:[id number] DOB: 08/09/1940 DOA: 02/27/2016     PCP: Lilian Coma, MD   Outpatient Specialists: [facility], [facility],   orthopedics Patient coming from:  home Lives   With family    Chief Complaint: Confusion and lethargy  HPI: [name] is a 76 y.o. female with medical history significant of arthritis, mild aortic stenosis, hypertension, DM 2 insulin-dependent, CKD, gout, chronic pain  History of DVT, H OD, hypothyroidism, peripheral neuropathy, chronic diastolic CHF   Presented with sudden onset of lethargy and diaphoresis no LOC she was with her friend at a time and this was witnessed. Sleep was associated with dizziness and left foot numbness. It was no witnessed seizure activity lead pressure was normal on EMS arrival is endorsing some headache. Patient's family states she takes her medication. For the past 2 weeks she has been have had body aches chills and cough family is concerned that she may have had flu.  Family she and her husband has been sick for past few days Blood sugar was checked and was normal in route the patient was slightly bradycardic. A Shim taken to emergency department as code stroke. On arrival patient was encephalopathic but NIHSS score was 0 and code stroke was canceled.  Patient has been seen on the 19th by orthopedics for rheumatoid arthritis flare falling and hurting her shoulder Started on prednisone taper   Regarding pertinent Chronic problems: History of rheumatoid arthritis on Orencia IV every month at [facility]. patient has been admitted in October for chest pain evaluation at that time echogram showed no wall motion abnormalities preserved EF and no diastolic dysfunction CO2 point was within normal limits she underwent a Lexiscan scan stress test which was negative Has long-standing history of diabetes on NovoLog  IN ER:  Temp (24hrs), Avg:98.7 F (37.1 C), Min:98.7 F (37.1 C),  Max:98.7 F (37.1 C)    Heart rate in 50s CBG 84 RR 15 99% RA, BP 188/62 Ammonia 9 VBG 7.439/32.7 TSH 14.791 Free T 4 0.74 WBC 7.7 hg 10.8 at baseline Na 140 K 4.5 initially 2.0 now down to Cr 1.73  LFTs within normal limits CT head non-acute Narcan given without any changes Following Medications were ordered in ER: Medications  sodium chloride 0.9 % bolus 1,000 mL (0 mLs Intravenous Stopped 02/27/16 2058)  naloxone (NARCAN) injection 0.4 mg (0.4 mg Intravenous Given 02/27/16 1942)     ER provider discussed case with:  Neurology feels that this is unlikely to be a stroke but rather acute encephalopathy recommend medicine to admit for further workup Hospitalist was called for admission for acute encephalopathy  Review of Systems:    Pertinent positives include: confusion,  Fatigue, right foot numbness  Constitutional:  No weight loss, night sweats, Fevers, chills, weight loss  HEENT:  No headaches, Difficulty swallowing,Tooth/dental problems,Sore throat,  No sneezing, itching, ear ache, nasal congestion, post nasal drip,  Cardio-vascular:  No chest pain, Orthopnea, PND, anasarca, dizziness, palpitations.no Bilateral lower extremity swelling  GI:  No heartburn, indigestion, abdominal pain, nausea, vomiting, diarrhea, change in bowel habits, loss of appetite, melena, blood in stool, hematemesis Resp:  no shortness of breath at rest. No dyspnea on exertion, No excess mucus, no productive cough, No non-productive cough, No coughing up of blood.No change in color of mucus.No wheezing. Skin:  no rash or lesions. No jaundice GU:  no dysuria, change in color of urine, no urgency or frequency. No straining to urinate.  No flank  pain.  Musculoskeletal:  No joint pain or no joint swelling. No decreased range of motion. No back pain.  Psych:  No change in mood or affect. No depression or anxiety. No memory loss.  Neuro: no localizing neurological complaints, no tingling, no weakness,  no double vision, no gait abnormality, no slurred speech, no confusion  As per HPI otherwise 10 point review of systems negative.   Past Medical History: Past Medical History:  Diagnosis Date  . Allergic rhinitis   . Anemia   . Aortic stenosis 12/22/2012   Mild 3/14-2.35 m/s peak velocity  . CHF (congestive heart failure) ([place])   . Chronic pain   . CKD (chronic kidney disease), stage III   . Diabetes mellitus   . Diverticulosis   . DVT (deep venous thrombosis) ([place])   . Glaucoma   . Hemorrhoids    internal  . HTN (hypertension)   . Hypercholesteremia   . Hyperlipidemia 12/22/2012  . Hypothyroidism   . Meningioma ([place]) 2008  . Osteoarthritis   . Peripheral neuropathy ([place])   . PPD positive    6 months ago  . PVC (premature ventricular contraction) 12/22/2012  . Rheumatoid arthritis(714.0)   . Sickle cell trait ([place])   . 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 ABDOMINAL HYSTERECTOMY  1978   TAH,& BSO 1  . fistula repair  03/2010   partial colectomy  . intracranial surgery  11/2007   removal of meningioma  . JOINT REPLACEMENT  2001   left TKR  . LEFT HEART CATHETERIZATION WITH CORONARY ANGIOGRAM N/A 08/08/2013   Procedure: LEFT HEART CATHETERIZATION WITH CORONARY ANGIOGRAM;  Surgeon: Peter M Martinique, MD;  Location[facility] C[facility] LAB;  Service: Cardiovascular;  Laterality: N/A;  . ROTATOR CUFF REPAIR  1999   BIlateral  . SIGMOIDECTOMY  2012  . TONSILLECTOMY AND ADENOIDECTOMY    . TOTAL THYROIDECTOMY  1992  . wrist sx     right      Social History:  Ambulatory   independently or cane     reports that she quit smoking about 54 years ago. Her smoking use included Cigarettes. She has a 0.20 pack-year smoking history. She has never used smokeless tobacco. She reports that she drinks alcohol. She reports that she does not use drugs.  Allergies:   Allergies    Allergen Reactions  . Methotrexate Derivatives Other (See Comments)    Elevated creat. Levels  . Other Other (See Comments)    NO Blood products.  [name]- causes low blood sugar (pt states she was taking medication but cant remember what it was for.)  . Penicillins Rash    Has patient had a PCN reaction causing immediate rash, facial/tongue/throat swelling, SOB or lightheadedness with hypotension: Yes Has patient had a PCN reaction causing severe rash involving mucus membranes or skin necrosis: Yes  Has patient had a PCN reaction that required hospitalization No Has patient had a PCN reaction occurring within the last 10 years: No If all of the above answers are "NO", then may proceed with Cephalosporin use.   . Enbrel [Etanercept] Other (See Comments)    Inadequate response    . Remicade [Infliximab] Other (See Comments)    Inadequate response   . Zocor [Simvastatin] Other (See Comments)    Leg cramps        Family History:  Family History  Problem  Relation Age of Onset  . Hypotension Mother   . CVA Mother   . Diabetes Mother   . Hypertension Father   . Kidney failure Father     Medications: Prior to Admission medications   Medication Sig Start Date End Date Taking? Authorizing Provider  Abatacept (ORENCIA IV) Inject into the vein. Daughters are not sure of doses. Received in the short stay area once a month   Yes Historical Provider, MD  acetaminophen (TYLENOL) 500 MG tablet Take 1,000 mg by mouth every 6 (six) hours as needed for mild pain.   Yes Historical Provider, MD  allopurinol (ZYLOPRIM) 300 MG tablet Take 1 tablet (300 mg total) by mouth daily. 02/25/16 08/23/16 Yes Naitik Panwala, PA-C  aspirin EC 81 MG EC tablet Take 1 tablet (81 mg total) by mouth daily. 10/22/15  Yes Jennifer Chahn-Yang Choi, DO  cyclobenzaprine (FLEXERIL) 10 MG tablet Take 10 mg by mouth at bedtime.    Yes Historical Provider, MD  diclofenac sodium (VOLTAREN) 1 % GEL 3 g to 3 larges  joints up to three times daily Patient taking differently: Apply 2 g topically 3 (three) times daily. 3 g to 3 larges joints up to three times daily 12/11/15  Yes Naitik Panwala, PA-C  ferrous sulfate (FEOSOL) 325 (65 FE) MG tablet Take 325-650 mg by mouth daily with breakfast.   Yes Historical Provider, MD  folic acid (FOLVITE) 1 MG tablet Take 1 mg by mouth daily.   Yes Historical Provider, MD  insulin NPH-regular Human (NOVOLIN 70/30) (70-30) 100 UNIT/ML injection Inject 6-8 Units into the skin 3 (three) times daily with meals. Sliding source   Yes Historical Provider, MD  levothyroxine (SYNTHROID, LEVOTHROID) 137 MCG tablet Take 137 mcg by mouth daily before breakfast. 10/12/15  Yes Historical Provider, MD  losartan (COZAAR) 50 MG tablet Take 50 mg by mouth every evening.   Yes Historical Provider, MD  metoprolol tartrate (LOPRESSOR) 25 MG tablet Take 1 tablet (25 mg total) by mouth 2 (two) times daily as needed. Patient taking differently: Take 25 mg by mouth daily.  05/07/15  Yes Jerline Pain, MD  pregabalin (LYRICA) 75 MG capsule 1 pill in AM  &  2 at night 12/04/15  Yes Naitik Panwala, PA-C  VITAMIN E PO Take 1 tablet by mouth daily.   Yes Historical Provider, MD  atorvastatin (LIPITOR) 40 MG tablet Take 40 mg by mouth daily at 6 PM.     Historical Provider, MD  BD INSULIN SYRINGE ULTRAFINE 31G X 15/64" 0.3 ML MISC  11/02/15   Historical Provider, MD  cholecalciferol (VITAMIN D) 1000 UNITS tablet Take 1,000 Units by mouth daily.     Historical Provider, MD  HYDROcodone-acetaminophen (NORCO/VICODIN) 5-325 MG per tablet Take 1-2 tablets by mouth every 3 (three) hours as needed for moderate pain. Patient not taking: Reported on 02/27/2016 08/26/14   Theodis Blaze, MD  multivitamin [facility]) per tablet Take 1 tablet by mouth daily.     Historical Provider, MD  predniSONE (DELTASONE) 5 MG tablet 4 tablets for 4 days, 3 for 4 days, 2 for 4 days, 1 for 4 days and half for 4 days. Patient not taking:  Reported on 02/25/2016 01/17/16   Naitik Panwala, PA-C  predniSONE (DELTASONE) 5 MG tablet 4po qAM x 3 days, 3po qAM x 3 days, 2po qAM x 3 days, 1po qAM x 3 days,then STOP; Disp 30 pills; Patient not taking: Reported on 02/27/2016 02/25/16   Eliezer Lofts, PA-C  Physical Exam: Patient Vitals for the past 24 hrs:  BP Temp Temp src Pulse Resp SpO2 Height Weight  02/27/16 2030 188/62 - - (!) 50 15 99 % - -  02/27/16 2000 172/85 - - (!) 49 13 100 % - -  02/27/16 1930 (!) 138/54 - - (!) 50 14 98 % - -  02/27/16 1900 151/69 - - (!) 50 15 97 % - -  02/27/16 1800 137/64 - - (!) 50 14 97 % - -  02/27/16 1745 - - - (!) 55 15 100 % - -  02/27/16 1730 - - - (!) 51 14 97 % - -  02/27/16 1715 160/64 - - (!) 50 14 98 % - -  02/27/16 1700 142/58 - - (!) 49 12 97 % - -  02/27/16 1657 130/58 98.7 F (37.1 C) Oral (!) 50 18 97 % - -  02/27/16 1655 130/58 - - - 16 - - -  02/27/16 1600 - - - - - - 5\' 4"  (1.626 m) 84.4 kg (186 lb 1.1 oz)    1. General:  in No Acute distress 2. Psychological:Somnolent not Oriented 3. Head/ENT:   Dry Mucous Membranes                          Head Non traumatic, neck supple                           Poor Dentition 4. SKIN: decreased Skin turgor,  Skin clean Dry and intact no rash 5. Heart: Regular rate and rhythm no  Murmur, Rub or gallop 6. Lungs: no wheezes or crackles   7. Abdomen: Soft, non-tender, Non distended 8. Lower extremities: no clubbing, cyanosis, or edema 9. Neurologically  strength 5 out of 5 in all 4 extremities cranial nerves II through XII intact 10. MSK: Normal range of motion   body mass index is 31.94 kg/m.  Labs on Admission:   Labs on Admission: I have personally reviewed following labs and imaging studies  CBC:  Recent Labs Lab 02/27/16 1721 02/27/16 1915  WBC  --  7.7  NEUTROABS  --  4.5  HGB 11.6* 10.8*  HCT 34.0* 32.9*  MCV  --  86.6  PLT  --  542   Basic Metabolic Panel:  Recent Labs Lab 02/27/16 1721 02/27/16 1915  NA  142 140  K 4.5 4.5  CL 109 106  CO2  --  23  GLUCOSE 93 108*  BUN 29* 21*  CREATININE 2.00* 1.73*  CALCIUM  --  9.9   GFR: Estimated Creatinine Clearance: 28.6 mL/min (by C-G formula based on SCr of 1.73 mg/dL (H)). Liver Function Tests:  Recent Labs Lab 02/27/16 1915  AST 16  ALT 13*  ALKPHOS 56  BILITOT 0.7  PROT 7.3  ALBUMIN 4.0   No results for input(s): LIPASE, AMYLASE in the last 168 hours. No results for input(s): AMMONIA in the last 168 hours. Coagulation Profile:  Recent Labs Lab 02/27/16 1915  INR 1.06   Cardiac Enzymes: No results for input(s): CKTOTAL, CKMB, CKMBINDEX, TROPONINI in the last 168 hours. BNP (last 3 results) No results for input(s): PROBNP in the last 8760 hours. HbA1C: No results for input(s): HGBA1C in the last 72 hours. CBG:  Recent Labs Lab 02/27/16 1639 02/27/16 2018  GLUCAP 84 91   Lipid Profile: No results for input(s): CHOL, HDL, LDLCALC, TRIG, CHOLHDL, LDLDIRECT in  the last 72 hours. Thyroid Function Tests:  Recent Labs  02/27/16 1938  TSH 14.791*  FREET4 0.74   Anemia Panel: No results for input(s): VITAMINB12, FOLATE, FERRITIN, TIBC, IRON, RETICCTPCT in the last 72 hours. Urine analysis:    Component Value Date/Time   COLORURINE YELLOW 02/27/2016 2002   APPEARANCEUR CLEAR 02/27/2016 2002   LABSPEC 1.015 02/27/2016 2002   LABSPEC 1.030 01/10/2010 1424   PHURINE 5.0 02/27/2016 2002   GLUCOSEU NEGATIVE 02/27/2016 2002   HGBUR NEGATIVE 02/27/2016 2002   BILIRUBINUR NEGATIVE 02/27/2016 2002   BILIRUBINUR not done 01/10/2010 [place] 02/27/2016 2002   PROTEINUR NEGATIVE 02/27/2016 2002   UROBILINOGEN 0.2 09/20/2014 1643   NITRITE NEGATIVE 02/27/2016 2002   LEUKOCYTESUR NEGATIVE 02/27/2016 2002   LEUKOCYTESUR Large 01/10/2010 1424   Sepsis Labs: @LABRCNTIP (procalcitonin:4,lacticidven:4) )No results found for this or any previous visit (from the past 240 hour(s)).   UA  no evidence of UTI    Lab Results  Component Value Date   HGBA1C 6.6 (H) 10/18/2015    Estimated Creatinine Clearance: 28.6 mL/min (by C-G formula based on SCr of 1.73 mg/dL (H)).  BNP (last 3 results) No results for input(s): PROBNP in the last 8760 hours.   ECG REPORT  Independently reviewed Rate: 50  Rhythm: sinus bradycardia ST&T Change: No acute ischemic changes  QTC 429  Filed Weights   02/27/16 1600  Weight: 84.4 kg (186 lb 1.1 oz)     Cultures:    Component Value Date/Time   SDES STOOL 11/28/2009 0702   SDES STOOL 11/28/2009 0702   SPECREQUEST Diarrhea 11/28/2009 0702   SPECREQUEST Diarrhea 11/28/2009 0702   CULT  11/28/2009 0702    NO SALMONELLA, SHIGELLA, CAMPYLOBACTER, OR YERSINIA ISOLATED Note: REDUCED NORMAL FLORA PRESENT   REPTSTATUS 12/01/2009 FINAL 11/28/2009 6720   REPTSTATUS 11/28/2009 FINAL 11/28/2009 9470     Radiological Exams on Admission: Ct Head Code Stroke W/o Cm  Result Date: 02/27/2016 CLINICAL DATA:  Code stroke. 76 year old female last seen normal at 1530 hours. Dizziness and left foot numbness. Initial encounter. Personal history of meningioma resection multiple years ago. EXAM: CT HEAD WITHOUT CONTRAST TECHNIQUE: Contiguous axial images were obtained from the base of the skull through the vertex without intravenous contrast. COMPARISON:  Brain MRI 07/27/2014.  Head CT 11/21/2009. FINDINGS: Brain: Cerebral volume remains normal for age. No midline shift, ventriculomegaly, mass effect, evidence of mass lesion, intracranial hemorrhage or evidence of cortically based acute infarction. Gray-white matter differentiation is within normal limits throughout the brain. No cortical encephalomalacia identified. Vascular: Calcified atherosclerosis at the skull base. No suspicious intracranial vascular hyperdensity. Dural calcifications. Skull: Stable previous right frontotemporal craniotomy. No acute osseous abnormality identified. Sinuses/Orbits: Visualized paranasal sinuses  and mastoids are stable and well pneumatized; chronic bubbly opacity in the left sphenoid is mildly improved compared to 2011. Other: No acute orbit or scalp soft tissue findings. ASPECTS ([place] Stroke Program Early CT Score) - Ganglionic level infarction (caudate, lentiform nuclei, internal capsule, insula, M1-M3 cortex): 7 - Supraganglionic infarction (M4-M6 cortex): 3 Total score (0-10 with 10 being normal): 10 IMPRESSION: 1. Stable and normal for age noncontrast CT appearance of the brain other than remote right frontotemporal craniotomy. 2. ASPECTS is 10. 3. The above was relayed via text pager to Dr.Oster on 02/27/2016 at 16:57 . Electronically Signed   By: Genevie Ann M.D.   On: 02/27/2016 16:58    Chart has been reviewed    Assessment/Plan  76 y.o. female with medical  history significant of arthritis, mild aortic stenosis, hypertension, DM 2 insulin-dependent, CKD, gout, chronic pain  History of DVT, H OD, hypothyroidism, peripheral neuropathy, chronic diastolic CHF admitted for for acute onset encephalopathy  Present on Admission: . Acute encephalopathy even sudden onset will obtain MRI/MRA to rule out possible CVA, hold sedation medications, evaluate for any evidence of infection, given recent influenza symptoms will obtain chest x-ray. Then sudden onset of symptoms monitor for any cardiac etiology also obtain d-dimer . CKD (chronic kidney disease) stage 3, GFR 30-59 ml/min creatinine slightly up from baseline we'll continue to rehydrate . Chronic pain hold narcotics for tonight given oversedation mental status did not improve with Narcan . HTN (hypertension) given worsening renal function will hold Cozaar . Hypothyroidism - TSH elevated T4 borderline low given bradycardia, chills, confusion and lethargy will give IV Synthroid at 100 today and monitor for any improvement she may have not been compliant Bradycardia will hold metoprolol obtain echogram to evaluate 20 wall motion abnormality  cycle cardiac enzymes treat hypothyroidism Given possibility of symptomatic bradycardia will monitor in step down Diabetes mellitus given soft blood glucose readings hold insulin for tonight monitor CBC sliding scale only for now  Other plan as per orders.  DVT prophylaxis:  SCD   Code Status:  FULL CODE   as per  family and is Jehov[employer]'s Witness do not administer whole blood patient is okay with blood derived products    Family Communication:   Family at  Bedside  plan of care was discussed with   Daughters,    Disposition Plan:      To home once workup is complete and patient is stable                      Would benefit from PT/OT eval prior to DC   ordered                       Consults called: Email  Cardiology   Admission status:    obs   Level of care    Stepdown       I have spent a total of 56 min on this admission   Jeanmarie Mccowen 02/27/2016, 10:37 PM    [facility]  Pager [phone]   after 2 AM please page floor coverage PA If 7AM-7PM, please contact the day team taking care of the patient  [email or web address]  Password [email or web address]

## 2016-02-27 NOTE — Consult Note (Signed)
Neurology Consult Note  Reason for Consultation: CODE STROKE  Requesting provider: Activated by EMS; ED MD Kathrynn Humble  CC: Headache  HPI: This is a 53-yo RH woman who presents to the ED as a CODE STROKE. History is obtained from the patient who is a limited historian. Additional information is obtained from the attending paramedics and from the review of her medical record.   Per EMS, the patient apparently drove herself to her friend's house this afternoon to visit. While there the patient reportedly became acutely diaphoretic, dizzy, and weak all over. Her friend called 911. EMS arrived and found the patient to be somewhat lethargic, c/o dizziness, headache, and L foot numbness. She was transported to the ED. I met her on arrival with the rest of the stroke team. She was taken for an emergent CTH which showed no acute abnormality. On exam, she was encephalopathic, c/o headache and L shoulder pain. She denies any focal weakness or numbness. No focal deficits were identified on her exam with NIHSS score 0. There was little to support stroke at this time so CODE STROKE was canceled.   Time last known well: 1530 NIHSS score: 0 tPA given?: No, suspect alternate diagnosis  PMH:  Past Medical History:  Diagnosis Date  . Allergic rhinitis   . Anemia   . Aortic stenosis 12/22/2012   Mild 3/14-2.35 m/s peak velocity  . CHF (congestive heart failure) ([place])   . Chronic pain   . CKD (chronic kidney disease), stage III   . Diabetes mellitus   . Diverticulosis   . DVT (deep venous thrombosis) ([place])   . Glaucoma   . Hemorrhoids    internal  . HTN (hypertension)   . Hypercholesteremia   . Hyperlipidemia 12/22/2012  . Hypothyroidism   . Meningioma ([place]) 2008  . Osteoarthritis   . Peripheral neuropathy ([place])   . PPD positive    6 months ago  . PVC (premature ventricular contraction) 12/22/2012  . Rheumatoid arthritis(714.0)   . Sickle cell trait ([place])   . Tuberculosis    history of positive  TB testing has yearly chest xrays in May / completed INH    PSH:  Past Surgical History:  Procedure Laterality Date  . ABDOMINAL HYSTERECTOMY  1978   TAH,& BSO 1  . fistula repair  03/2010   partial colectomy  . intracranial surgery  11/2007   removal of meningioma  . JOINT REPLACEMENT  2001   left TKR  . LEFT HEART CATHETERIZATION WITH CORONARY ANGIOGRAM N/A 08/08/2013   Procedure: LEFT HEART CATHETERIZATION WITH CORONARY ANGIOGRAM;  Surgeon: Peter M Martinique, MD;  Location[facility] CATH LAB;  Service: Cardiovascular;  Laterality: N/A;  . ROTATOR CUFF REPAIR  1999   BIlateral  . SIGMOIDECTOMY  2012  . TONSILLECTOMY AND ADENOIDECTOMY    . TOTAL THYROIDECTOMY  1992  . wrist sx     right     Family history: Family History  Problem Relation Age of Onset  . Hypotension Mother   . CVA Mother   . Diabetes Mother   . Hypertension Father   . Kidney failure Father     Social history:  Social History   Social History  . Marital status: Married    Spouse name: N/A  . Number of children: N/A  . Years of education: N/A   Occupational History  . Not on file.   Social History Main Topics  . Smoking status: Former Smoker    Packs/day: 0.10    Years:  2.00    Types: Cigarettes    Quit date: 01/06/1962  . Smokeless tobacco: Never Used     Comment: Socially   . Alcohol use Yes     Comment: occasionally  . Drug use: No  . Sexual activity: Not on file   Other Topics Concern  . Not on file   Social History Narrative   Daycare Provider    Current outpatient meds: No outpatient prescriptions have been marked as taking for the 02/27/16 encounter [facility] Encounter).    Current inpatient meds:  No current facility-administered medications for this encounter.    Current Outpatient Prescriptions  Medication Sig Dispense Refill  . Abatacept (ORENCIA IV) Inject into the vein.    Marland Kitchen acetaminophen (TYLENOL) 500 MG tablet Take 1,000 mg by mouth every 6 (six) hours as needed for mild  pain.    Marland Kitchen allopurinol (ZYLOPRIM) 300 MG tablet Take 1 tablet (300 mg total) by mouth daily. 30 tablet 5  . aspirin EC 81 MG EC tablet Take 1 tablet (81 mg total) by mouth daily. 30 tablet 0  . atorvastatin (LIPITOR) 40 MG tablet Take 40 mg by mouth daily at 6 PM.     . BD INSULIN SYRINGE ULTRAFINE 31G X 15/64" 0.3 ML MISC     . cholecalciferol (VITAMIN D) 1000 UNITS tablet Take 1,000 Units by mouth daily.     . cyclobenzaprine (FLEXERIL) 10 MG tablet Take 10 mg by mouth at bedtime.     . diclofenac sodium (VOLTAREN) 1 % GEL 3 g to 3 larges joints up to three times daily 1 Tube 3  . ferrous sulfate (FEOSOL) 325 (65 FE) MG tablet Take 325-650 mg by mouth daily with breakfast.    . folic acid (FOLVITE) 1 MG tablet Take 1 mg by mouth daily.    Marland Kitchen HYDROcodone-acetaminophen (NORCO/VICODIN) 5-325 MG per tablet Take 1-2 tablets by mouth every 3 (three) hours as needed for moderate pain. 30 tablet 0  . insulin NPH-regular Human (NOVOLIN 70/30) (70-30) 100 UNIT/ML injection Inject 6-8 Units into the skin 3 (three) times daily with meals. Sliding source    . levothyroxine (SYNTHROID, LEVOTHROID) 137 MCG tablet Take 137 mcg by mouth daily before breakfast.    . losartan (COZAAR) 50 MG tablet Take 50 mg by mouth every evening.    . metoprolol tartrate (LOPRESSOR) 25 MG tablet Take 1 tablet (25 mg total) by mouth 2 (two) times daily as needed. (Patient taking differently: Take 25 mg by mouth daily. ) 60 tablet 6  . multivitamin (THERAGRAN) per tablet Take 1 tablet by mouth daily.     . predniSONE (DELTASONE) 5 MG tablet 4 tablets for 4 days, 3 for 4 days, 2 for 4 days, 1 for 4 days and half for 4 days. (Patient not taking: Reported on 02/25/2016) 42 tablet 0  . predniSONE (DELTASONE) 5 MG tablet 4po qAM x 3 days, 3po qAM x 3 days, 2po qAM x 3 days, 1po qAM x 3 days,then STOP; Disp 30 pills; 30 tablet 0  . pregabalin (LYRICA) 75 MG capsule 1 pill in AM  &  2 at night 84 capsule 0    Allergies: Allergies   Allergen Reactions  . Methotrexate Derivatives Other (See Comments)    Elevated creat. Levels  . Other Other (See Comments)    NO Blood products.  [name]- causes low blood sugar (pt states she was taking medication but cant remember what it was for.)  . Penicillins Rash  Has patient had a PCN reaction causing immediate rash, facial/tongue/throat swelling, SOB or lightheadedness with hypotension: Yes Has patient had a PCN reaction causing severe rash involving mucus membranes or skin necrosis: Yes  Has patient had a PCN reaction that required hospitalization No Has patient had a PCN reaction occurring within the last 10 years: No If all of the above answers are "NO", then may proceed with Cephalosporin use.   . Enbrel [Etanercept] Other (See Comments)    Inadequate response    . Remicade [Infliximab] Other (See Comments)    Inadequate response   . Zocor [Simvastatin] Other (See Comments)    Leg cramps     ROS: As per HPI. A full 14-point review of systems was performed and is otherwise unremarkable. However, this is somewhat limited by poor sustained attention in the setting of encephalopathy.   PE:  BP 130/58 (BP Location: Left Arm)   Pulse (!) 50   Temp 98.7 F (37.1 C) (Oral)   Resp 18   Ht 5' 4"  (1.626 m)   Wt 84.4 kg (186 lb 1.1 oz)   LMP  (LMP Unknown)   SpO2 97%   BMI 31.94 kg/m   General: WDWN, appears uncomfortable c/o headache and L shoulder pain. She tends to drift to sleep but will rouse to voice. However, she requires persistent stimulation to remain engaged. Exam is thus limited by poor effort. AAO x4. Speech notable for mild dysarthria but this appears to be largely effort-dependent. No aphasia. Follows commands with some delay and with inconsistent effort. Affect is flat. HEENT: Normocephalic. Neck supple without LAD. MMM, OP clear. Dentition good. Sclerae anicteric. No conjunctival injection.  CV: Regular, bradycardic, no murmur. Carotid pulses full  and symmetric, no bruits. Distal pulses 2+ and symmetric.  Lungs: CTAB on anterior exam.  Abdomen: Soft, non-distended, non-tender. Bowel sounds present x4.  Extremities: No C/C/E. Neuro:  CN: Pupils are equal and round. They are symmetrically reactive from 3-->2 mm. She blinks to visual threat x4. EOMI without nystagmus. No reported diplopia. Facial sensation is intact to light touch. Face is symmetric at rest with normal strength and mobility. Hearing is intact to conversational voice. Tongue is midline with normal bulk and mobility. The remainder of her cranial nerves cannot be accurately assessed due to poor effort.  Motor: Normal bulk, tone. She gives poor effort with confrontational testing but is at least 4/5 throughout with no focal weakness. She has giveway with limited ROM in the LUE due to shoulder pain which she says is chronic. She also has some giveway in both legs due to nondescript pain. No tremor or other abnormal movements. No drift.  Sensation: Intact to light touch.  DTRs: 2+, symmetric. Toes downgoing bilaterally. No pathologic reflexes.  Coordination and gait: These are deferred due to poor effort.   Labs:  Lab Results  Component Value Date   WBC 8.7 01/14/2016   HGB 10.8 (L) 01/14/2016   HCT 32.5 (L) 01/14/2016   PLT 302 01/14/2016   GLUCOSE 163 (H) 01/14/2016   CHOL 205 (H) 08/06/2013   TRIG 126 08/06/2013   HDL 84 08/06/2013   LDLCALC 96 08/06/2013   ALT 16 01/14/2016   AST 28 01/14/2016   NA 139 01/14/2016   K 4.5 01/14/2016   CL 107 01/14/2016   CREATININE 1.48 (H) 01/14/2016   BUN 16 01/14/2016   CO2 21 (L) 01/14/2016   TSH 6.122 (H) 11/29/2009   INR 1.02 09/22/2014   HGBA1C 6.6 (H) 10/18/2015  Imaging:  I have personally and independently reviewed the [facility] without contrast from today. This shows mild diffuse generalized atrophy with mild chronic small vessel ischemic disease. Prior R frontotemporal craniotomy noted. No evident acute pathology is  appreciated.    Assessment and Plan:  1. Acute encephalopathy: She is clearly encephalopathic here in the ED but has not focal deficits and is not endorsing any particular neurologic complaints on evaluation. Per EMS report, this was apparently a sudden change this afternoon and was accompanied by diaphoresis, dizziness, and generalized weakness. This raises concern for a possible primary cardiac event. No witnessed seizure activity. EMS reports normal blood pressure and normal blood sugar. Daughter arrived later and reported that the patient and her husband have been sick for the past two weeks Awaiting labs to exclude metabolic derangement as cause. No evidence of primary CNS event at this time. Treat headache symptomatically. Will defer further evaluation to ED providers.   No further recommendations at this time, will sign off. Please call if any new issues arise.

## 2016-02-27 NOTE — ED Notes (Signed)
No change in mental status after receiving narcan.

## 2016-02-27 NOTE — ED Provider Notes (Signed)
[facility] DEPT Provider Note   CSN: [id number] Arrival date & time: 02/27/16  1636     History   Chief Complaint Chief Complaint  Patient presents with  . Code Stroke    HPI [name] is a 76 y.o. female.  HPI Arrives as a code stroke after patient had a acute onset of confusion and lethargy History obtained from paramedics as patient is unable to provide any history They state the patient was with a friend when she suddenly became lethargic, diaphoretic She did not lose consciousness The event has continued since onset They checked her sugar and it was within normal limits She was slightly bradycardic in route and EKG showed sinus bradycardia Patient taken emergently to the CT scan Denies cp or sob, no back pain History limited due to acuity of condition and patient's mental status  Past Medical History:  Diagnosis Date  . Allergic rhinitis   . Anemia   . Aortic stenosis 12/22/2012   Mild 3/14-2.35 m/s peak velocity  . CHF (congestive heart failure) ([place])   . Chronic pain   . CKD (chronic kidney disease), stage III   . Diabetes mellitus   . Diverticulosis   . DVT (deep venous thrombosis) ([place])   . Glaucoma   . Hemorrhoids    internal  . HTN (hypertension)   . Hypercholesteremia   . Hyperlipidemia 12/22/2012  . Hypothyroidism   . Meningioma ([place]) 2008  . Osteoarthritis   . Peripheral neuropathy ([place])   . PPD positive    6 months ago  . PVC (premature ventricular contraction) 12/22/2012  . Rheumatoid arthritis(714.0)   . Sickle cell trait ([place])   . 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 Acute encephalopathy 02/27/2016  . Rheumatoid arthritis ([place]) 01/17/2016  . High risk medications (not anticoagulants) long-term use 01/17/2016  . Bilateral hand pain 01/17/2016  . Bilateral wrist pain 01/17/2016  . Pain in right elbow  01/17/2016  . Left shoulder pain 01/17/2016  . Encounter for therapeutic drug monitoring 11/02/2015  . Bronchiectasis without complication ([place]) 23/76/2831  . Chronic gout involving toe without tophus 10/30/2015  . High risk medication use 10/29/2015  . Gout 10/29/2015  . H/O total knee replacement, left 10/29/2015  . History of positive PPD 10/29/2015  . Diabetes mellitus, type II, insulin dependent ([place]) 10/18/2015  . Rheumatoid arthritis flare ([place]) 10/18/2015  . AKI (acute kidney injury) ([place]) 10/18/2015  . CKD (chronic kidney disease) stage 3, GFR 30-59 ml/min 10/18/2015  . Nodule of left lung 10/18/2015  . Neuropathy ([place]) 08/22/2014  . Hypothyroidism 08/22/2014  . Chronic pain 08/22/2014  . Chronic congestive heart failure with left ventricular diastolic dysfunction ([place]) 08/08/2013  . Chest pain 08/05/2013  . Aortic stenosis 12/22/2012  . Hyperlipidemia 12/22/2012  . PVC (premature ventricular contraction) 12/22/2012  . HTN (hypertension) 12/22/2012  . Open angle with borderline findings, high risk 12/15/2011  . Cataract, nuclear 05/25/2011    Past Surgical History:  Procedure Laterality Date  . ABDOMINAL HYSTERECTOMY  1978   TAH,& BSO 1  . fistula repair  03/2010   partial colectomy  . intracranial surgery  11/2007   removal of meningioma  . JOINT REPLACEMENT  2001   left TKR  . LEFT HEART CATHETERIZATION WITH CORONARY ANGIOGRAM N/A 08/08/2013   Procedure: LEFT HEART CATHETERIZATION WITH CORONARY ANGIOGRAM;  Surgeon: [name]  M Martinique, MD;  Location: [facility] CATH LAB;  Service: Cardiovascular;  Laterality: N/A;  . ROTATOR CUFF REPAIR  1999   BIlateral  . SIGMOIDECTOMY  2012  . TONSILLECTOMY AND ADENOIDECTOMY    . TOTAL THYROIDECTOMY  1992  . wrist sx     right     OB History    No data available       Home Medications    Prior to Admission medications   Medication Sig Start Date End Date Taking? Authorizing Provider  Abatacept (ORENCIA IV) Inject into the vein.  Daughters are not sure of doses. Received in the short stay area once a month   Yes Historical Provider, MD  acetaminophen (TYLENOL) 500 MG tablet Take 1,000 mg by mouth every 6 (six) hours as needed for mild pain.   Yes Historical Provider, MD  allopurinol (ZYLOPRIM) 300 MG tablet Take 1 tablet (300 mg total) by mouth daily. 02/25/16 08/23/16 Yes Naitik Panwala, PA-C  aspirin EC 81 MG EC tablet Take 1 tablet (81 mg total) by mouth daily. 10/22/15  Yes Jennifer Chahn-Yang Choi, DO  cyclobenzaprine (FLEXERIL) 10 MG tablet Take 10 mg by mouth at bedtime.    Yes Historical Provider, MD  diclofenac sodium (VOLTAREN) 1 % GEL 3 g to 3 larges joints up to three times daily Patient taking differently: Apply 2 g topically 3 (three) times daily. 3 g to 3 larges joints up to three times daily 12/11/15  Yes Naitik Panwala, PA-C  ferrous sulfate (FEOSOL) 325 (65 FE) MG tablet Take 325-650 mg by mouth daily with breakfast.   Yes Historical Provider, MD  folic acid (FOLVITE) 1 MG tablet Take 1 mg by mouth daily.   Yes Historical Provider, MD  insulin NPH-regular Human (NOVOLIN 70/30) (70-30) 100 UNIT/ML injection Inject 6-8 Units into the skin 3 (three) times daily with meals. Sliding source   Yes Historical Provider, MD  levothyroxine (SYNTHROID, LEVOTHROID) 137 MCG tablet Take 137 mcg by mouth daily before breakfast. 10/12/15  Yes Historical Provider, MD  losartan (COZAAR) 50 MG tablet Take 50 mg by mouth every evening.   Yes Historical Provider, MD  metoprolol tartrate (LOPRESSOR) 25 MG tablet Take 1 tablet (25 mg total) by mouth 2 (two) times daily as needed. Patient taking differently: Take 25 mg by mouth daily.  05/07/15  Yes Jerline Pain, MD  pregabalin (LYRICA) 75 MG capsule 1 pill in AM  &  2 at night 12/04/15  Yes Naitik Panwala, PA-C  VITAMIN E PO Take 1 tablet by mouth daily.   Yes Historical Provider, MD  atorvastatin (LIPITOR) 40 MG tablet Take 40 mg by mouth daily at 6 PM.     Historical Provider, MD  BD  INSULIN SYRINGE ULTRAFINE 31G X 15/64" 0.3 ML MISC  11/02/15   Historical Provider, MD  cholecalciferol (VITAMIN D) 1000 UNITS tablet Take 1,000 Units by mouth daily.     Historical Provider, MD  HYDROcodone-acetaminophen (NORCO/VICODIN) 5-325 MG per tablet Take 1-2 tablets by mouth every 3 (three) hours as needed for moderate pain. Patient not taking: Reported on 02/27/2016 08/26/14   Theodis Blaze, MD  multivitamin [facility]) per tablet Take 1 tablet by mouth daily.     Historical Provider, MD  predniSONE (DELTASONE) 5 MG tablet 4 tablets for 4 days, 3 for 4 days, 2 for 4 days, 1 for 4 days and half for 4 days. Patient not taking: Reported on 02/25/2016 01/17/16   Naitik Panwala, PA-C  predniSONE (DELTASONE) 5  MG tablet 4po qAM x 3 days, 3po qAM x 3 days, 2po qAM x 3 days, 1po qAM x 3 days,then STOP; Disp 30 pills; Patient not taking: Reported on 02/27/2016 02/25/16   Eliezer Lofts, PA-C    Family History Family History  Problem Relation Age of Onset  . Hypotension Mother   . CVA Mother   . Diabetes Mother   . Hypertension Father   . Kidney failure Father     Social History Social History  Substance Use Topics  . Smoking status: Former Smoker    Packs/day: 0.10    Years: 2.00    Types: Cigarettes    Quit date: 01/06/1962  . Smokeless tobacco: Never Used     Comment: Socially   . Alcohol use Yes     Comment: occasionally     Allergies   Methotrexate derivatives; Other; Penicillins; Enbrel [etanercept]; Remicade [infliximab]; and Zocor [simvastatin]   Review of Systems Review of Systems  Unable to perform ROS: Mental status change     Physical Exam Updated Vital Signs BP 145/64   Pulse (!) 47   Temp 98.7 F (37.1 C) (Oral)   Resp 14   Ht 5\' 4"  (1.626 m)   Wt 84.4 kg   LMP  (LMP Unknown)   SpO2 98%   BMI 31.94 kg/m   Physical Exam  Constitutional: She appears well-developed and well-nourished. No distress.  HENT:  Head: Normocephalic and atraumatic.  Eyes:  Conjunctivae are normal.  Pupils 32mm bilaterally, equal  Neck: Neck supple.  Cardiovascular: Regular rhythm.   No murmur heard. Bradycardic to 50s  Pulmonary/Chest: Effort normal and breath sounds normal. No respiratory distress.  Abdominal: Soft. There is no tenderness.  Musculoskeletal: She exhibits no edema.  Moves all extremities, does not follow commands  Neurological:  Somnolent but does state yes and no States in ED but cannot state year  Skin: Skin is warm and dry.  Psychiatric: She has a normal mood and affect.  Nursing note and vitals reviewed.    ED Treatments / Results  Labs (all labs ordered are listed, but only abnormal results are displayed) Labs Reviewed  CBC - Abnormal; Notable for the following:       Result Value   RBC 3.80 (*)    Hemoglobin 10.8 (*)    HCT 32.9 (*)    RDW 16.9 (*)    All other components within normal limits  COMPREHENSIVE METABOLIC PANEL - Abnormal; Notable for the following:    Glucose, Bld 108 (*)    BUN 21 (*)    Creatinine, Ser 1.73 (*)    ALT 13 (*)    GFR calc non Af Amer 27 (*)    GFR calc Af Amer 32 (*)    All other components within normal limits  RAPID URINE DRUG SCREEN, HOSP PERFORMED - Abnormal; Notable for the following:    Opiates POSITIVE (*)    All other components within normal limits  TSH - Abnormal; Notable for the following:    TSH 14.791 (*)    All other components within normal limits  I-STAT CHEM 8, ED - Abnormal; Notable for the following:    BUN 29 (*)    Creatinine, Ser 2.00 (*)    Calcium, Ion 1.13 (*)    Hemoglobin 11.6 (*)    HCT 34.0 (*)    All other components within normal limits  I-STAT VENOUS BLOOD GAS, ED - Abnormal; Notable for the following:    pH, Ven  7.439 (*)    pCO2, Ven 32.7 (*)    pO2, Ven 57.0 (*)    All other components within normal limits  ETHANOL  APTT  DIFFERENTIAL  URINALYSIS, ROUTINE W REFLEX MICROSCOPIC  T4, FREE  PROTIME-INR  AMMONIA  BLOOD GAS, VENOUS  TROPONIN I   TROPONIN I  TROPONIN I  D-DIMER, QUANTITATIVE (NOT AT [facility])  T3  INFLUENZA PANEL BY PCR (TYPE A & B)  HEMOGLOBIN A1C  LIPID PANEL  I-STAT TROPOININ, ED  CBG MONITORING, ED  CBG MONITORING, ED  CBG MONITORING, ED    EKG  EKG Interpretation None       Radiology Ct Head Code Stroke W/o Cm  Result Date: 02/27/2016 CLINICAL DATA:  Code stroke. 76 year old female last seen normal at 1530 hours. Dizziness and left foot numbness. Initial encounter. Personal history of meningioma resection multiple years ago. EXAM: CT HEAD WITHOUT CONTRAST TECHNIQUE: Contiguous axial images were obtained from the base of the skull through the vertex without intravenous contrast. COMPARISON:  Brain MRI 07/27/2014.  Head CT 11/21/2009. FINDINGS: Brain: Cerebral volume remains normal for age. No midline shift, ventriculomegaly, mass effect, evidence of mass lesion, intracranial hemorrhage or evidence of cortically based acute infarction. Gray-white matter differentiation is within normal limits throughout the brain. No cortical encephalomalacia identified. Vascular: Calcified atherosclerosis at the skull base. No suspicious intracranial vascular hyperdensity. Dural calcifications. Skull: Stable previous right frontotemporal craniotomy. No acute osseous abnormality identified. Sinuses/Orbits: Visualized paranasal sinuses and mastoids are stable and well pneumatized; chronic bubbly opacity in the left sphenoid is mildly improved compared to 2011. Other: No acute orbit or scalp soft tissue findings. ASPECTS ([place] Stroke Program Early CT Score) - Ganglionic level infarction (caudate, lentiform nuclei, internal capsule, insula, M1-M3 cortex): 7 - Supraganglionic infarction (M4-M6 cortex): 3 Total score (0-10 with 10 being normal): 10 IMPRESSION: 1. Stable and normal for age noncontrast CT appearance of the brain other than remote right frontotemporal craniotomy. 2. ASPECTS is 10. 3. The above was relayed via text pager to  Dr.Oster on 02/27/2016 at 16:57 . Electronically Signed   By: Genevie Ann M.D.   On: 02/27/2016 16:58    Procedures Procedures (including critical care time)  Medications Ordered in ED Medications  allopurinol (ZYLOPRIM) tablet 300 mg (not administered)   stroke: mapping our early stages of recovery book (not administered)  0.9 %  sodium chloride infusion (not administered)  acetaminophen (TYLENOL) tablet 650 mg (not administered)    Or  acetaminophen (TYLENOL) solution 650 mg (not administered)    Or  acetaminophen (TYLENOL) suppository 650 mg (not administered)  aspirin suppository 300 mg (not administered)    Or  aspirin tablet 325 mg (not administered)  insulin aspart (novoLOG) injection 0-9 Units (not administered)  levothyroxine (SYNTHROID, LEVOTHROID) injection 100 mcg (not administered)  levothyroxine (SYNTHROID, LEVOTHROID) injection 75 mcg (not administered)  sodium chloride 0.9 % bolus 1,000 mL (0 mLs Intravenous Stopped 02/27/16 2058)  naloxone (NARCAN) injection 0.4 mg (0.4 mg Intravenous Given 02/27/16 1942)     Initial Impression / Assessment and Plan / ED Course  I have reviewed the triage vital signs and the nursing notes.  Pertinent labs & imaging results that were available during my care of the patient were reviewed by me and considered in my medical decision making (see chart for details).     Encephalopathy of unclear etiology but AF, no neck stiffness to suggest meningitis. CTH negative. Ammonia ok. CBG negative. PH with alkalosis, respiratory. TSH is elevated, hx  of such but acute onset not consistent with myxedema coma but bradycardia suggestive of symptomatic sx. UDS with opiates only - narcan did not produce any improvement of sx. Does have AKI but electrolytes ok. BUN acceptable. Neurology saw, no need for MRI emergently - code stroke cancelled. Unclear etiology at this time but will need monitoring, hypothyroidism rx, further eval and likely inpt MRI. Will  admit.  Final Clinical Impressions(s) / ED Diagnoses   Final diagnoses:  Encephalopathy  TIA (transient ischemic attack)  TIA (transient ischemic attack)  TIA (transient ischemic attack)    New Prescriptions New Prescriptions   No medications on file     Karma Greaser, MD 02/27/16 [id number]    Varney Biles, MD 02/28/16 1512

## 2016-02-27 NOTE — ED Notes (Signed)
CODE STROKE CANCELED

## 2016-02-27 NOTE — ED Notes (Signed)
Dr. Kathrynn Humble at bedside attempting [place] IV and EJ.

## 2016-02-27 NOTE — ED Notes (Signed)
PT returned to ED.  RRT, RN, and Neurohospitalist at bedside.  Lab still attempting to obtain blood.  No IV access.  Called for [place] IV RN to attempt IV.

## 2016-02-27 NOTE — ED Notes (Addendum)
IV team remains at bedside 

## 2016-02-27 NOTE — Code Documentation (Addendum)
76 year old female presents to [facility] via [place] as code stroke.  Friend reports she drove to her house to visit then at 3:30 pm  - had sudden onset diaphoresis, lethargy and overall weakness.  EMS was called who saw c/o headache and left foot numbness.  Code stroke was called in the field.  In ED she is lethargic - speaks quietly and slowly - no aphasia - follows commands - complains of left shoulder pain and a headache. Stat CT scan done.  Dr. Shon Hale at bedside.  Patient without clear focal deficits - appears encephalopathic.  Daughter now at bedside - reports patient and her husband have not felt well for 2 weeks - not leaving the house.  Patient takes pain meds for left shoulder - daughter feels she only takes these at night.  BP 160/64 HR 50 - looking at previous EKG has hx of SB.   CBG 84.  Difficult IV stick.  Code stroke cancelled by Dr. Shon Hale.  Handoff to [employer].

## 2016-02-28 ENCOUNTER — Observation Stay (HOSPITAL_BASED_OUTPATIENT_CLINIC_OR_DEPARTMENT_OTHER): Payer: Medicare Other

## 2016-02-28 ENCOUNTER — Observation Stay (HOSPITAL_COMMUNITY): Payer: Medicare Other

## 2016-02-28 ENCOUNTER — Encounter (HOSPITAL_COMMUNITY): Payer: Self-pay | Admitting: Radiology

## 2016-02-28 DIAGNOSIS — E039 Hypothyroidism, unspecified: Secondary | ICD-10-CM | POA: Diagnosis not present

## 2016-02-28 DIAGNOSIS — E785 Hyperlipidemia, unspecified: Secondary | ICD-10-CM | POA: Diagnosis present

## 2016-02-28 DIAGNOSIS — R42 Dizziness and giddiness: Secondary | ICD-10-CM

## 2016-02-28 DIAGNOSIS — R001 Bradycardia, unspecified: Secondary | ICD-10-CM

## 2016-02-28 DIAGNOSIS — N183 Chronic kidney disease, stage 3 (moderate): Secondary | ICD-10-CM | POA: Diagnosis not present

## 2016-02-28 DIAGNOSIS — E7800 Pure hypercholesterolemia, unspecified: Secondary | ICD-10-CM | POA: Diagnosis present

## 2016-02-28 DIAGNOSIS — G9340 Encephalopathy, unspecified: Secondary | ICD-10-CM | POA: Diagnosis not present

## 2016-02-28 DIAGNOSIS — E890 Postprocedural hypothyroidism: Secondary | ICD-10-CM | POA: Diagnosis present

## 2016-02-28 DIAGNOSIS — I459 Conduction disorder, unspecified: Secondary | ICD-10-CM | POA: Diagnosis not present

## 2016-02-28 DIAGNOSIS — I350 Nonrheumatic aortic (valve) stenosis: Secondary | ICD-10-CM | POA: Diagnosis present

## 2016-02-28 DIAGNOSIS — R918 Other nonspecific abnormal finding of lung field: Secondary | ICD-10-CM | POA: Diagnosis not present

## 2016-02-28 DIAGNOSIS — D573 Sickle-cell trait: Secondary | ICD-10-CM | POA: Diagnosis present

## 2016-02-28 DIAGNOSIS — M0579 Rheumatoid arthritis with rheumatoid factor of multiple sites without organ or systems involvement: Secondary | ICD-10-CM

## 2016-02-28 DIAGNOSIS — I130 Hypertensive heart and chronic kidney disease with heart failure and stage 1 through stage 4 chronic kidney disease, or unspecified chronic kidney disease: Secondary | ICD-10-CM | POA: Diagnosis present

## 2016-02-28 DIAGNOSIS — Z86718 Personal history of other venous thrombosis and embolism: Secondary | ICD-10-CM | POA: Diagnosis not present

## 2016-02-28 DIAGNOSIS — R4182 Altered mental status, unspecified: Secondary | ICD-10-CM | POA: Diagnosis not present

## 2016-02-28 DIAGNOSIS — I6789 Other cerebrovascular disease: Secondary | ICD-10-CM | POA: Diagnosis not present

## 2016-02-28 DIAGNOSIS — M069 Rheumatoid arthritis, unspecified: Secondary | ICD-10-CM | POA: Diagnosis present

## 2016-02-28 DIAGNOSIS — I6330 Cerebral infarction due to thrombosis of unspecified cerebral artery: Secondary | ICD-10-CM | POA: Diagnosis not present

## 2016-02-28 DIAGNOSIS — R7989 Other specified abnormal findings of blood chemistry: Secondary | ICD-10-CM | POA: Diagnosis not present

## 2016-02-28 DIAGNOSIS — E1122 Type 2 diabetes mellitus with diabetic chronic kidney disease: Secondary | ICD-10-CM | POA: Diagnosis present

## 2016-02-28 DIAGNOSIS — Z794 Long term (current) use of insulin: Secondary | ICD-10-CM | POA: Diagnosis not present

## 2016-02-28 DIAGNOSIS — G8929 Other chronic pain: Secondary | ICD-10-CM | POA: Diagnosis present

## 2016-02-28 DIAGNOSIS — R29700 NIHSS score 0: Secondary | ICD-10-CM | POA: Diagnosis present

## 2016-02-28 DIAGNOSIS — N179 Acute kidney failure, unspecified: Secondary | ICD-10-CM | POA: Diagnosis present

## 2016-02-28 DIAGNOSIS — Z87891 Personal history of nicotine dependence: Secondary | ICD-10-CM | POA: Diagnosis not present

## 2016-02-28 DIAGNOSIS — M109 Gout, unspecified: Secondary | ICD-10-CM | POA: Diagnosis present

## 2016-02-28 DIAGNOSIS — G894 Chronic pain syndrome: Secondary | ICD-10-CM | POA: Diagnosis not present

## 2016-02-28 DIAGNOSIS — E119 Type 2 diabetes mellitus without complications: Secondary | ICD-10-CM | POA: Diagnosis not present

## 2016-02-28 DIAGNOSIS — I639 Cerebral infarction, unspecified: Secondary | ICD-10-CM | POA: Diagnosis not present

## 2016-02-28 DIAGNOSIS — I5032 Chronic diastolic (congestive) heart failure: Secondary | ICD-10-CM | POA: Diagnosis present

## 2016-02-28 DIAGNOSIS — M1990 Unspecified osteoarthritis, unspecified site: Secondary | ICD-10-CM | POA: Diagnosis present

## 2016-02-28 DIAGNOSIS — E873 Alkalosis: Secondary | ICD-10-CM | POA: Diagnosis present

## 2016-02-28 DIAGNOSIS — Z7982 Long term (current) use of aspirin: Secondary | ICD-10-CM | POA: Diagnosis not present

## 2016-02-28 DIAGNOSIS — E1142 Type 2 diabetes mellitus with diabetic polyneuropathy: Secondary | ICD-10-CM | POA: Diagnosis present

## 2016-02-28 DIAGNOSIS — I10 Essential (primary) hypertension: Secondary | ICD-10-CM | POA: Diagnosis not present

## 2016-02-28 LAB — COMPREHENSIVE METABOLIC PANEL
ALT: 12 U/L — AB (ref 14–54)
AST: 15 U/L (ref 15–41)
Albumin: 3.4 g/dL — ABNORMAL LOW (ref 3.5–5.0)
Alkaline Phosphatase: 50 U/L (ref 38–126)
Anion gap: 8 (ref 5–15)
BUN: 16 mg/dL (ref 6–20)
CHLORIDE: 110 mmol/L (ref 101–111)
CO2: 22 mmol/L (ref 22–32)
CREATININE: 1.29 mg/dL — AB (ref 0.44–1.00)
Calcium: 9.3 mg/dL (ref 8.9–10.3)
GFR calc Af Amer: 45 mL/min — ABNORMAL LOW (ref >60)
GFR, EST NON AFRICAN AMERICAN: 39 mL/min — AB (ref >60)
Glucose, Bld: 91 mg/dL (ref 65–99)
Potassium: 4.2 mmol/L (ref 3.5–5.1)
Sodium: 140 mmol/L (ref 135–145)
Total Bilirubin: 0.9 mg/dL (ref 0.3–1.2)
Total Protein: 6.8 g/dL (ref 6.5–8.1)

## 2016-02-28 LAB — ECHOCARDIOGRAM COMPLETE
Height: 64 in
WEIGHTICAEL: 2952.4 [oz_av]

## 2016-02-28 LAB — TROPONIN I
Troponin I: <0.03 ng/mL (ref <0.03)
Troponin I: <0.03 ng/mL (ref <0.03)

## 2016-02-28 LAB — GLUCOSE, CAPILLARY
GLUCOSE-CAPILLARY: 65 mg/dL (ref 65–99)
GLUCOSE-CAPILLARY: 149 mg/dL — AB (ref 65–99)
GLUCOSE-CAPILLARY: 123 mg/dL — AB (ref 65–99)
Glucose-Capillary: 80 mg/dL (ref 65–99)
Glucose-Capillary: 125 mg/dL — ABNORMAL HIGH (ref 65–99)
Glucose-Capillary: 93 mg/dL (ref 65–99)

## 2016-02-28 LAB — CBC WITH DIFFERENTIAL/PLATELET
BASOS PCT: 0 %
Basophils Absolute: 0 10*3/uL (ref 0.0–0.1)
EOS PCT: 2 %
Eosinophils Absolute: 0.1 10*3/uL (ref 0.0–0.7)
HCT: 32.6 % — ABNORMAL LOW (ref 36.0–46.0)
Hemoglobin: 10.8 g/dL — ABNORMAL LOW (ref 12.0–15.0)
LYMPHS ABS: 3.2 10*3/uL (ref 0.7–4.0)
Lymphocytes Relative: 49 %
MCH: 28.6 pg (ref 26.0–34.0)
MCHC: 33.1 g/dL (ref 30.0–36.0)
MCV: 86.2 fL (ref 78.0–100.0)
MONO ABS: 0.3 10*3/uL (ref 0.1–1.0)
Monocytes Relative: 5 %
Neutro Abs: 2.8 10*3/uL (ref 1.7–7.7)
Neutrophils Relative %: 44 %
PLATELETS: 206 10*3/uL (ref 150–400)
RBC: 3.78 MIL/uL — ABNORMAL LOW (ref 3.87–5.11)
RDW: 16.9 % — ABNORMAL HIGH (ref 11.5–15.5)
WBC: 6.4 10*3/uL (ref 4.0–10.5)

## 2016-02-28 LAB — MRSA PCR SCREENING: MRSA by PCR: NEGATIVE

## 2016-02-28 LAB — INFLUENZA PANEL BY PCR (TYPE A & B)
INFLAPCR: NEGATIVE
Influenza B By PCR: NEGATIVE

## 2016-02-28 LAB — LIPID PANEL
Cholesterol: 179 mg/dL (ref 0–200)
HDL: 47 mg/dL (ref >40)
LDL CALC: 107 mg/dL — AB (ref 0–99)
TRIGLYCERIDES: 123 mg/dL (ref <150)
Total CHOL/HDL Ratio: 3.8 RATIO
VLDL: 25 mg/dL (ref 0–40)

## 2016-02-28 LAB — CBG MONITORING, ED: GLUCOSE-CAPILLARY: 74 mg/dL (ref 65–99)

## 2016-02-28 LAB — D-DIMER, QUANTITATIVE: D-Dimer, Quant: 5.97 ug/mL-FEU — ABNORMAL HIGH (ref 0.00–0.50)

## 2016-02-28 MED ORDER — ABATACEPT 250 MG IV SOLR: 750.0000 mg | INTRAVENOUS | Status: DC

## 2016-02-28 MED ORDER — ATORVASTATIN CALCIUM 40 MG PO TABS: 40.0000 mg | ORAL_TABLET | Freq: Every day | ORAL | Status: DC

## 2016-02-28 MED ORDER — DIPHENHYDRAMINE HCL 25 MG PO CAPS: 25.0000 mg | ORAL_CAPSULE | ORAL | Status: DC

## 2016-02-28 MED ORDER — ORAL CARE MOUTH RINSE
15.0000 mL | Freq: Two times a day (BID) | OROMUCOSAL | Status: DC
  Administered 2016-02-29: 15 mL via OROMUCOSAL

## 2016-02-28 MED ORDER — ACETAMINOPHEN 325 MG PO TABS: 650.0000 mg | ORAL_TABLET | ORAL | Status: DC

## 2016-02-28 MED ORDER — DEXTROSE 50 % IV SOLN
INTRAVENOUS | Status: AC
  Administered 2016-02-28: 25 mL
  Filled 2016-02-28: qty 50

## 2016-02-28 MED ORDER — IOPAMIDOL (ISOVUE-370) INJECTION 76%
INTRAVENOUS | Status: AC
  Administered 2016-02-28: 100 mL
  Filled 2016-02-28: qty 100

## 2016-02-28 MED ORDER — ATORVASTATIN CALCIUM 10 MG PO TABS: 10.0000 mg | ORAL_TABLET | Freq: Every day | ORAL | Status: DC

## 2016-02-28 NOTE — Progress Notes (Signed)
PROGRESS NOTE    [name]  WGY:[id number] DOB: [birth year] DOA: 02/27/2016 PCP: Lilian Coma, MD   Chief Complaint  Patient presents with  . Code Stroke     Brief Narrative:  HPI On 02/27/2016 by Dr. Toy Baker [name] is a 76 y.o. female with medical history significant of arthritis, mild aortic stenosis, hypertension, DM 2 insulin-dependent, CKD, gout, chronic pain History of DVT, H OD, hypothyroidism, peripheral neuropathy,chronic diastolic CHF  Presented with sudden onset of lethargy and diaphoresis no LOC she was with her friend at a time and this was witnessed. Sleep was associated with dizziness and left foot numbness. It was no witnessed seizure activity lead pressure was normal on EMS arrival is endorsing some headache. Patient's family states she takes her medication. For the past 2 weeks she has been have had body aches chills and cough family is concerned that she may have had flu. Family she and her husband has been sick for past few days Blood sugar was checked and was normal in route the patient was slightly bradycardic. A Shim taken to emergency department as code stroke. On arrival patient was encephalopathic but NIHSS score was 0 and code stroke was canceled. Patient has been seen on the 19th by orthopedics for rheumatoid arthritis flare falling and hurting her shoulder Started on prednisone taper  Assessment & Plan   Syncope follow by Acute Encephalopathy -Currently patient is alert and oriented x3 -?seizure vs bradycardia -EEG pending -neurology consulted and appreciated -CT head showed stable and normal CT -MRI brain showed 2 punctate foci of acute ischemia within the left paracentral pons. No acute hemorrhage or mass effect -Doubt that MRI findings are cause of patient's AMS and syncope  Bradycardia -Cardiology/EP consulted for possible need of pacemaker -HR has been in the 40s  Acute CVA -CT and MRI as above -Echocardiogram,  carotid doppler pending -Continue aspirin and statin -LDL 107 -hemoglobin A1c pending  -patient currently does not exhibit neuro deficits   Elevate DDimer -Patient with history of upper extremity DVT last year.  Was placed on Xarelto however there was an interaction with her RA med Orencia, and Xarelto discontinued -Will obtain LE doppler and CTA chest   Hypothyroidism -TSH 14.791, FT4 0.74 -Continue synthroid (currently IV 59mcg) -Spoke with Dr. Buddy Duty (patient's endocrinologist). Med rec states synthroid 118mcg daily, she is supposed to be on 165mcg daily (recent increase in dose)  Chronic kidney disease, stage III -Creatine mildly elevated upon admission, 2 (however, GFR in stage III) -Currently creatinine 1.29 (baseline 1.2-1.4) -Continue to monitor BMP  Essential hypertension -given MRI findings, will allow for permissive HTN -patient also with bradycardia, metorpolol held  Chronic pain -narcotics held due to AMS  Diabetes mellitus, type II -Home insulin (novolin 70/30) currently held -continue ISS and CBG monitoring -Hemoglobin A1c pending   Chronic anemia -hemoglobin currently 10.8, appears to be at baseline -Continue to monitor CBC  DVT Prophylaxis  SCDs  Code Status: Full  Family Communication: Daughter at bedside  Disposition Plan: Currently in observation. Pending further workup of syncope/bradycarida and elevated DDimer  Consultants Neurology Cardiology/EP  Procedures  None  Antibiotics   Anti-infectives    None      Subjective:   [name] seen and examined today.  Patient states she is feeling fine today. Has no complaints.  Denies current chest pain, shortness of breath, abdominal pain, N/V/D/C.   Objective:   Vitals:   02/28/16 0432 02/28/16 0749 02/28/16 1200 02/28/16 1229  BP: (!) 138/50 (!) 161/60  (!) 141/71  Pulse: (!) 47 (!) 47 (!) 47 (!) 48  Resp: 16 14 16 16   Temp:  97.8 F (36.6 C)  97 F (36.1 C)  TempSrc:  Oral   Axillary  SpO2: 97% 100% 98%   Weight:      Height:        Intake/Output Summary (Last 24 hours) at 02/28/16 1420 Last data filed at 02/28/16 1400  Gross per 24 hour  Intake             1900 ml  Output              400 ml  Net             1500 ml   Filed Weights   02/27/16 1600 02/28/16 0135  Weight: 84.4 kg (186 lb 1.1 oz) 83.7 kg (184 lb 8.4 oz)    Exam  General: Well developed, well nourished, NAD, appears stated age  HEENT: NCAT, PERRLA, EOMI, Anicteic Sclera, mucous membranes moist.   Neck: Supple, no JVD, no masses  Cardiovascular: S1 S2 auscultated, no rubs, murmurs or gallops. bradycardic  Respiratory: Clear to auscultation bilaterally with equal chest rise  Abdomen: Soft, nontender, nondistended, + bowel sounds  Extremities: warm dry without cyanosis clubbing or edema  Neuro: AAOx3, slow to respond, nonfocal  Skin: Without rashes exudates or nodules  Psych: Normal affect and demeanor with intact judgement and insight   Data Reviewed: I have personally reviewed following labs and imaging studies  CBC:  Recent Labs Lab 02/27/16 1721 02/27/16 1915 02/28/16 0434  WBC  --  7.7 6.4  NEUTROABS  --  4.5 2.8  HGB 11.6* 10.8* 10.8*  HCT 34.0* 32.9* 32.6*  MCV  --  86.6 86.2  PLT  --  268 277   Basic Metabolic Panel:  Recent Labs Lab 02/27/16 1721 02/27/16 1915 02/28/16 0434  NA 142 140 140  K 4.5 4.5 4.2  CL 109 106 110  CO2  --  23 22  GLUCOSE 93 108* 91  BUN 29* 21* 16  CREATININE 2.00* 1.73* 1.29*  CALCIUM  --  9.9 9.3   GFR: Estimated Creatinine Clearance: 38.2 mL/min (by C-G formula based on SCr of 1.29 mg/dL (H)). Liver Function Tests:  Recent Labs Lab 02/27/16 1915 02/28/16 0434  AST 16 15  ALT 13* 12*  ALKPHOS 56 50  BILITOT 0.7 0.9  PROT 7.3 6.8  ALBUMIN 4.0 3.4*   No results for input(s): LIPASE, AMYLASE in the last 168 hours.  Recent Labs Lab 02/27/16 2054  AMMONIA 9   Coagulation Profile:  Recent Labs Lab  02/27/16 1915  INR 1.06   Cardiac Enzymes:  Recent Labs Lab 02/28/16 0434 02/28/16 0953  TROPONINI <0.03 <0.03   BNP (last 3 results) No results for input(s): PROBNP in the last 8760 hours. HbA1C: No results for input(s): HGBA1C in the last 72 hours. CBG:  Recent Labs Lab 02/28/16 0113 02/28/16 0221 02/28/16 0253 02/28/16 0806 02/28/16 1228  GLUCAP 74 65 149* 80 125*   Lipid Profile:  Recent Labs  02/28/16 0434  CHOL 179  HDL 47  LDLCALC 107*  TRIG 123  CHOLHDL 3.8   Thyroid Function Tests:  Recent Labs  02/27/16 1938  TSH 14.791*  FREET4 0.74   Anemia Panel: No results for input(s): VITAMINB12, FOLATE, FERRITIN, TIBC, IRON, RETICCTPCT in the last 72 hours. Urine analysis:    Component Value Date/Time   COLORURINE YELLOW  02/27/2016 2002   APPEARANCEUR CLEAR 02/27/2016 2002   LABSPEC 1.015 02/27/2016 2002   LABSPEC 1.030 01/10/2010 1424   PHURINE 5.0 02/27/2016 2002   GLUCOSEU NEGATIVE 02/27/2016 2002   HGBUR NEGATIVE 02/27/2016 2002   BILIRUBINUR NEGATIVE 02/27/2016 2002   BILIRUBINUR not done 01/10/2010 [place] 02/27/2016 2002   PROTEINUR NEGATIVE 02/27/2016 2002   UROBILINOGEN 0.2 09/20/2014 1643   NITRITE NEGATIVE 02/27/2016 2002   LEUKOCYTESUR NEGATIVE 02/27/2016 2002   LEUKOCYTESUR Large 01/10/2010 1424   Sepsis Labs: @LABRCNTIP (procalcitonin:4,lacticidven:4)  ) Recent Results (from the past 240 hour(s))  MRSA PCR Screening     Status: None   Collection Time: 02/28/16  1:43 AM  Result Value Ref Range Status   MRSA by PCR NEGATIVE NEGATIVE Final    Comment:        The GeneXpert MRSA Assay (FDA approved for NASAL specimens only), is one component of a comprehensive MRSA colonization surveillance program. It is not intended to diagnose MRSA infection nor to guide or monitor treatment for MRSA infections.       Radiology Studies: Dg Chest 2 View  Result Date: 02/27/2016 CLINICAL DATA:  76 year old female  with transient ischemic attack. EXAM: CHEST  2 VIEW COMPARISON:  Chest radiograph dated 02/04/2016 FINDINGS: Minimal left lung base atelectatic changes. No focal consolidation, pleural effusion, or pneumothorax. The cardiac silhouette is within normal limits. No acute osseous pathology. IMPRESSION: No active cardiopulmonary disease. Electronically Signed   By: Anner Crete M.D.   On: 02/27/2016 23:33   Mr Brain Wo Contrast  Result Date: 02/28/2016 CLINICAL DATA:  Sudden onset lethargy. Dizziness and left foot numbness. EXAM: MRI HEAD WITHOUT CONTRAST MRA HEAD WITHOUT CONTRAST TECHNIQUE: Multiplanar, multiecho pulse sequences of the brain and surrounding structures were obtained without intravenous contrast. Angiographic images of the head were obtained using MRA technique without contrast. COMPARISON:  Head CT 02/27/2016 Brain MRI 07/27/2014 FINDINGS: MRI HEAD FINDINGS Brain: There are 2 punctate foci of diffusion restriction within the left paracentral pons (series 3, image 15). No other diffusion restriction. No intracranial hemorrhage. There is multifocal hyperintense T2-weighted signal within the periventricular white matter, most often seen in the setting of chronic microvascular ischemia. No mass lesion or midline shift. No hydrocephalus or extra-axial fluid collection. The midline structures are normal. No age advanced or lobar predominant atrophy. Vascular: Major intracranial arterial and venous sinus flow voids are preserved. Chronic micro hemorrhages in the anterior right temporal lobe are unchanged. Skull and upper cervical spine: Remote right frontoparietal craniotomy. Sinuses/Orbits: No fluid levels or advanced mucosal thickening. No mastoid effusion. Normal orbits. MRA HEAD FINDINGS Intracranial internal carotid arteries: Normal. Anterior cerebral arteries: Hypoplastic right A1 segment is a normal congenital variant. The anterior cerebral arteries are otherwise normal. Middle cerebral arteries:  Normal. Posterior communicating arteries: Absent bilaterally. Posterior cerebral arteries: Normal. Basilar artery: Normal. Vertebral arteries: Codominant. Normal. Superior cerebellar arteries: Normal. Anterior inferior cerebellar arteries: Normal. Posterior inferior cerebellar arteries: Normal. IMPRESSION: 1. Two punctate foci of acute ischemia within the left paracentral pons. No acute hemorrhage or mass effect. 2. Normal intracranial MRA. Electronically Signed   By: Ulyses Jarred M.D.   On: 02/28/2016 01:01   Mr Jodene Nam Head/brain PX Cm  Result Date: 02/28/2016 CLINICAL DATA:  Sudden onset lethargy. Dizziness and left foot numbness. EXAM: MRI HEAD WITHOUT CONTRAST MRA HEAD WITHOUT CONTRAST TECHNIQUE: Multiplanar, multiecho pulse sequences of the brain and surrounding structures were obtained without intravenous contrast. Angiographic images of the head were obtained using  MRA technique without contrast. COMPARISON:  Head CT 02/27/2016 Brain MRI 07/27/2014 FINDINGS: MRI HEAD FINDINGS Brain: There are 2 punctate foci of diffusion restriction within the left paracentral pons (series 3, image 15). No other diffusion restriction. No intracranial hemorrhage. There is multifocal hyperintense T2-weighted signal within the periventricular white matter, most often seen in the setting of chronic microvascular ischemia. No mass lesion or midline shift. No hydrocephalus or extra-axial fluid collection. The midline structures are normal. No age advanced or lobar predominant atrophy. Vascular: Major intracranial arterial and venous sinus flow voids are preserved. Chronic micro hemorrhages in the anterior right temporal lobe are unchanged. Skull and upper cervical spine: Remote right frontoparietal craniotomy. Sinuses/Orbits: No fluid levels or advanced mucosal thickening. No mastoid effusion. Normal orbits. MRA HEAD FINDINGS Intracranial internal carotid arteries: Normal. Anterior cerebral arteries: Hypoplastic right A1 segment  is a normal congenital variant. The anterior cerebral arteries are otherwise normal. Middle cerebral arteries: Normal. Posterior communicating arteries: Absent bilaterally. Posterior cerebral arteries: Normal. Basilar artery: Normal. Vertebral arteries: Codominant. Normal. Superior cerebellar arteries: Normal. Anterior inferior cerebellar arteries: Normal. Posterior inferior cerebellar arteries: Normal. IMPRESSION: 1. Two punctate foci of acute ischemia within the left paracentral pons. No acute hemorrhage or mass effect. 2. Normal intracranial MRA. Electronically Signed   By: Ulyses Jarred M.D.   On: 02/28/2016 01:01   Ct Head Code Stroke W/o Cm  Result Date: 02/27/2016 CLINICAL DATA:  Code stroke. 76 year old female last seen normal at 1530 hours. Dizziness and left foot numbness. Initial encounter. Personal history of meningioma resection multiple years ago. EXAM: CT HEAD WITHOUT CONTRAST TECHNIQUE: Contiguous axial images were obtained from the base of the skull through the vertex without intravenous contrast. COMPARISON:  Brain MRI 07/27/2014.  Head CT 11/21/2009. FINDINGS: Brain: Cerebral volume remains normal for age. No midline shift, ventriculomegaly, mass effect, evidence of mass lesion, intracranial hemorrhage or evidence of cortically based acute infarction. Gray-white matter differentiation is within normal limits throughout the brain. No cortical encephalomalacia identified. Vascular: Calcified atherosclerosis at the skull base. No suspicious intracranial vascular hyperdensity. Dural calcifications. Skull: Stable previous right frontotemporal craniotomy. No acute osseous abnormality identified. Sinuses/Orbits: Visualized paranasal sinuses and mastoids are stable and well pneumatized; chronic bubbly opacity in the left sphenoid is mildly improved compared to 2011. Other: No acute orbit or scalp soft tissue findings. ASPECTS ([place] Stroke Program Early CT Score) - Ganglionic level infarction  (caudate, lentiform nuclei, internal capsule, insula, M1-M3 cortex): 7 - Supraganglionic infarction (M4-M6 cortex): 3 Total score (0-10 with 10 being normal): 10 IMPRESSION: 1. Stable and normal for age noncontrast CT appearance of the brain other than remote right frontotemporal craniotomy. 2. ASPECTS is 10. 3. The above was relayed via text pager to Dr.Oster on 02/27/2016 at 16:57 . Electronically Signed   By: Genevie Ann M.D.   On: 02/27/2016 16:58     Scheduled Meds: . [START ON 03/10/2016] abatacept (ORENCIA) IV  750 mg Intravenous Q28 days  . [START ON 03/10/2016] acetaminophen  650 mg Oral 60 min Pre-Op  . allopurinol  300 mg Oral Daily  . aspirin  300 mg Rectal Daily   Or  . aspirin  325 mg Oral Daily  . atorvastatin  40 mg Oral q1800  . [START ON 03/10/2016] diphenhydrAMINE  25 mg Oral 60 min Pre-Op  . insulin aspart  0-9 Units Subcutaneous Q4H  . levothyroxine  75 mcg Intravenous Daily  . mouth rinse  15 mL Mouth Rinse BID   Continuous Infusions: . sodium  chloride 75 mL/hr at 02/28/16 1400     LOS: 0 days   Time Spent in minutes   30 minutes  Brandalynn Ofallon D.O. on 02/28/2016 at 2:20 PM  Between 7am to 7pm - Pager - [phone]  After 7pm go to [email or web address] - password [email or web address]  And look for the night coverage person covering for me after hours  [facility] Office  [phone]

## 2016-02-28 NOTE — Evaluation (Signed)
Clinical/Bedside Swallow Evaluation Patient Details  Name: [name] MRN: [id number] Date of Birth: 04/25/1940  Today's Date: 02/28/2016 Time: SLP Start Time (ACUTE ONLY): 4 SLP Stop Time (ACUTE ONLY): 0940 SLP Time Calculation (min) (ACUTE ONLY): 20 min  Past Medical History:  Past Medical History:  Diagnosis Date  . Allergic rhinitis   . Anemia   . Aortic stenosis 12/22/2012   Mild 3/14-2.35 m/s peak velocity  . CHF (congestive heart failure) ([place])   . Chronic pain   . CKD (chronic kidney disease), stage III   . Diabetes mellitus   . Diverticulosis   . DVT (deep venous thrombosis) ([place])   . Glaucoma   . Hemorrhoids    internal  . HTN (hypertension)   . Hypercholesteremia   . Hyperlipidemia 12/22/2012  . Hypothyroidism   . Meningioma ([place]) 2008  . Osteoarthritis   . Peripheral neuropathy ([place])   . PPD positive    6 months ago  . PVC (premature ventricular contraction) 12/22/2012  . Rheumatoid arthritis(714.0)   . Sickle cell trait ([place])   . 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 ABDOMINAL HYSTERECTOMY  1978   TAH,& BSO 1  . fistula repair  03/2010   partial colectomy  . intracranial surgery  11/2007   removal of meningioma  . JOINT REPLACEMENT  2001   left TKR  . LEFT HEART CATHETERIZATION WITH CORONARY ANGIOGRAM N/A 08/08/2013   Procedure: LEFT HEART CATHETERIZATION WITH CORONARY ANGIOGRAM;  Surgeon: Peter M Martinique, MD;  Location: [facility] CATH LAB;  Service: Cardiovascular;  Laterality: N/A;  . ROTATOR CUFF REPAIR  1999   BIlateral  . SIGMOIDECTOMY  2012  . TONSILLECTOMY AND ADENOIDECTOMY    . TOTAL THYROIDECTOMY  1992  . wrist sx     right    HPI:  Julia O Bemburyis a 76 y.o.femalewith medical history significant of arthritis, mild aortic stenosis, hypertension, DM 2 insulin-dependent, CKD, gout, chronic pain.  History of DVT, H OD, hypothyroidism, peripheral neuropathy,chronic diastolic CHF. Presented with sudden onset of lethargy and diaphoresis no LOC she was with her friend at a time and this was witnessed. There are 2 punctate foci of diffusion restriction within the left paracentral pons   Assessment / Plan / Recommendation Clinical Impression  Pt demonstrates no sign of dysphagia or aspiration. Tolerates 3 oz of thin liquids consumed consecutively. Recommend pt initiate a regular texture diet. Will sign off.  SLP Visit Diagnosis: Dysphagia, unspecified (R13.10)    Aspiration Risk  Mild aspiration risk    Diet Recommendation Regular;Thin liquid   Liquid Administration via: Cup;Straw Medication Administration: Whole meds with liquid Supervision: Patient able to self feed Postural Changes: Seated upright at 90 degrees    Other  Recommendations Oral Care Recommendations: Patient independent with oral care   Follow up Recommendations None      Frequency and Duration            Prognosis        Swallow Study   General HPI: Julia O Bemburyis a 76 y.o.femalewith medical history significant of arthritis, mild aortic stenosis, hypertension, DM 2 insulin-dependent, CKD, gout, chronic pain. History of DVT, H OD, hypothyroidism, peripheral neuropathy,chronic diastolic CHF. Presented with sudden onset of lethargy and diaphoresis no LOC she was with her friend at a time and this was witnessed. There are 2 punctate  foci of diffusion restriction within the left paracentral pons Type of Study: Bedside Swallow Evaluation Diet Prior to this Study: NPO Temperature Spikes Noted: No Respiratory Status: Room air History of Recent Intubation: No Behavior/Cognition: Alert;Cooperative;Pleasant mood Oral Cavity Assessment: Within Functional Limits Oral Care Completed by SLP: No Oral Cavity - Dentition: Adequate natural dentition Vision: Functional for self-feeding Self-Feeding Abilities: Able  to feed self Patient Positioning: Upright in bed Baseline Vocal Quality: Normal Volitional Cough: Strong Volitional Swallow: Able to elicit    Oral/Motor/Sensory Function Overall Oral Motor/Sensory Function: Within functional limits   Ice Chips Ice chips: Not tested   Thin Liquid Thin Liquid: Within functional limits Presentation: Cup;Straw;Self Fed    Nectar Thick Nectar Thick Liquid: Not tested   Honey Thick Honey Thick Liquid: Not tested   Puree Puree: Within functional limits Presentation: Self Fed;Spoon   Solid   GO   Solid: Within functional limits Presentation: Self Fed    Functional Assessment Tool Used: clinical judgement Functional Limitations: Motor speech Motor Speech Current Status [phone]): 0 percent impaired, limited or restricted Motor Speech Goal Status (T7001): 0 percent impaired, limited or restricted Motor Speech Goal Status (V4944): 0 percent impaired, limited or restricted  Herbie Baltimore, MA CCC-SLP [phone]  Finlee Milo, Katherene Ponto 02/28/2016,9:50 AM

## 2016-02-28 NOTE — Progress Notes (Signed)
Bedside EEG completed, results pending. 

## 2016-02-28 NOTE — Progress Notes (Addendum)
STROKE TEAM PROGRESS NOTE   SUBJECTIVE (INTERVAL HISTORY) Her daughter is at the bedside.  She feels her mother is back at the bedside. No changes since am. they recounted episodes yesterday and prior - all include passing out and inability to recognize surroundings. Strokes present on MRI do not correlate with presenting symptoms. Symptoms more consistent with cardiac event. Will also check EEG given prior brain surgery. She has history of frontal temporal brain surgery for meningioma removal by Dr. Vertell Limber but no documented history of seizures and has not been on any seizure medications   OBJECTIVE Temp:  [96.5 F (35.8 C)-98.7 F (37.1 C)] 97.8 F (36.6 C) (02/22 0749) Pulse Rate:  [47-55] 47 (02/22 0749) Cardiac Rhythm: Sinus bradycardia (02/22 0800) Resp:  [12-18] 14 (02/22 0749) BP: (130-188)/(41-85) 161/60 (02/22 0749) SpO2:  [97 %-100 %] 100 % (02/22 0749) Weight:  [83.7 kg (184 lb 8.4 oz)-84.4 kg (186 lb 1.1 oz)] 83.7 kg (184 lb 8.4 oz) (02/22 0135)  CBC:  Recent Labs Lab 02/27/16 1915 02/28/16 0434  WBC 7.7 6.4  NEUTROABS 4.5 2.8  HGB 10.8* 10.8*  HCT 32.9* 32.6*  MCV 86.6 86.2  PLT 268 540    Basic Metabolic Panel:  Recent Labs Lab 02/27/16 1915 02/28/16 0434  NA 140 140  K 4.5 4.2  CL 106 110  CO2 23 22  GLUCOSE 108* 91  BUN 21* 16  CREATININE 1.73* 1.29*  CALCIUM 9.9 9.3   HgbA1c:  Lab Results  Component Value Date   HGBA1C 6.6 (H) 10/18/2015     PHYSICAL EXAM Pleasant elderly African-American lady not in distress. . Afebrile. Head is nontraumatic. Neck is supple without bruit.    Cardiac exam no murmur or gallop. Lungs are clear to auscultation. Distal pulses are well felt. Neurological Exam ;  Awake  Alert oriented x 3. Normal speech and language.Diminished attention and recall.eye movements full without nystagmus.fundi were not visualized. Vision acuity and fields appear normal. Hearing is normal. Palatal movements are normal. Face symmetric.  Tongue midline. Normal strength, tone, reflexes and coordination except left shoulder elevation limited due to rotator cuff injury. Normal sensation. Gait deferred.  ASSESSMENT/PLAN [name] is a 76 y.o. female with history of Arthritis, mild aortic stenosis, HTN, DM, CAD, gout, chronic pain, history of DVT, hypothyroidism, PN, chronic diastolic CHF, and recurrent syncopal episodes presenting with a syncopal episode with loss of consciousness. She did not receive IV t-PA due to non-stroke presentation.   Stroke:  Incidental punctate L paracentral pontine infarcts 2 in setting of bradycardia and syncopal episode. Rule out seizure.  Code stroke CT normal except old R frontotemporal crani. Aspects 10  MRI  2 punctate L paracentral pontine infarcts  MRA  Normal  No further brain imaging needed  Carotid Doppler  pending   Lower extremity Doppler pending   2D Echo  pending   check EEG pending   LDL 107  HgbA1c pending  SCDs for VTE prophylaxis  Diet Heart Room service appropriate? Yes; Fluid consistency: Thin  aspirin 81 mg daily prior to admission, now on aspirin 325 mg daily  Therapy recommendations:  Pending  Disposition:  Pending  Hypertension  Stable   Hyperlipidemia  Home meds:  Lipitor 40, resumed in hospital  LDL above goal for stroke  Continue statin at discharge  Diabetes  HgbA1c goal < 7.0  Other Stroke Risk Factors  Advanced age  Former Cigarette smoker  UDS positive opiates  Obesity, Body mass index is 31.67  kg/m.  Family hx stroke (mother)  Other Active Problems  Encephalopathy, post syncope   Syncope with bradycardia. May need pacer. Dr. Leonie Man asked Dr. Ron Parker to consult  Rheumatoid arthritis  Gout  Hypothyroidism  Hospital day # 0  Radene Journey [facility] [place] for Pager information 02/28/2016 2:28 PM  I have personally examined this patient, reviewed notes, independently viewed imaging  studies, participated in medical decision making and plan of care.ROS completed by me personally and pertinent positives fully documented  I have made any additions or clarifications directly to the above note. Agree with note above. 76 year old lady with recurrent stereotypical episodes of dizziness, perspiration and altered consciousness followed by lethargy and tiredness and confusion in the setting of known chronic bradycardia possibilities include stroke Adams episodes versus complex partial seizures given prior history of meningioma surgery. Clearly her areas of tiny ill-defined pontine diffusion abnormality is unrelated to her clinical presentation and likely represents changes of chronic microvascular ischemia with some acute component secondary to hypertension, syncopal event Check EEG. If cardiology feels this is not cardiac may consider empirical trial of anticonvulsants. Discussed with Dr. Ree Kida and Ron Parker. Greater than 50% time during this 35 minute visit was spent on counseling and coordination of care about her episodes of altered consciousness, confusion, seizures, bradycardia and syncope and answering questions  Antony Contras, [place] Pager: [phone] 02/28/2016 2:35 PM  To contact Stroke Continuity provider, please refer to [email or web address] After hours, contact General Neurology

## 2016-02-28 NOTE — Progress Notes (Signed)
  Echocardiogram 2D Echocardiogram has been performed.  Jennette Dubin 02/28/2016, 4:47 PM

## 2016-02-28 NOTE — Progress Notes (Signed)
Dr. Ree Kida updated me on patient today. I saw her in the ED yesterday as a CODE STROKE. She had no focal deficits and appeared more encephalopathic than anything so stroke was felt to be unlikely. However, MRI showed two punctate areas of acute ischemia. Will have stroke team follow up.

## 2016-02-28 NOTE — Evaluation (Signed)
Occupational Therapy Evaluation Patient Details Name: [name] MRN: [id number] DOB: May 18, 1940 Today's Date: 02/28/2016    History of Present Illness Pt admitted with suddent onset of lethargy and diaphoresis. + punctuate foci of diffusion restriction within paracentral pons. PMH: arthritis, aortic stenosis, DM, HTN, CKD, gout, chronic pain, DVT, peripheral neuropathy, CHF.   Clinical Impression   Pt was independent in ADL and was walking with a cane prior to admission. Per daughter, pt is functioning at her baseline in cognition, although pt presenting with decreased attention, impaired processing/sequencing and poor standing balance. Pt requires min assist for ADL, generally, but had one LOB during pericare requiring moderate assist to correct. Pt will have assist of her family upon discharge. Will follow.   Follow Up Recommendations  Home health OT    Equipment Recommendations  None recommended by OT    Recommendations for Other Services       Precautions / Restrictions Precautions Precautions: Fall Restrictions Weight Bearing Restrictions: No      Mobility Bed Mobility Overal bed mobility: Needs Assistance Bed Mobility: Sit to Supine       Sit to supine: Mod assist   General bed mobility comments: assist for LEs back into bed  Transfers Overall transfer level: Needs assistance Equipment used: 1 person hand held assist Transfers: Sit to/from Stand Sit to Stand: Min assist         General transfer comment: posterior LOB in standing with pericare requiring mod assist to steady    Balance Overall balance assessment: Needs assistance   Sitting balance-Leahy Scale: Fair       Standing balance-Leahy Scale: Poor                              ADL Overall ADL's : Needs assistance/impaired Eating/Feeding: Independent;Sitting   Grooming: Wash/dry hands;Standing;Set up   Upper Body Bathing: Set up;Sitting   Lower Body Bathing: Minimal  assistance;Sit to/from stand   Upper Body Dressing : Minimal assistance;Sitting   Lower Body Dressing: Minimal assistance;Sit to/from stand   Toilet Transfer: Minimal assistance;Ambulation;BSC   Toileting- Water quality scientist and Hygiene: Sit to/from stand;Moderate assistance       Functional mobility during ADLs: Minimal assistance (hand held) General ADL Comments: Pt requiring steadying assist for ADL in standing.     Vision Patient Visual Report: No change from baseline       Perception     Praxis      Pertinent Vitals/Pain Pain Assessment: Faces Pain Score: 0-No pain Faces Pain Scale: Hurts little more Pain Location: R hip/knee (chronic) Pain Descriptors / Indicators: Sore Pain Intervention(s): Monitored during session     Hand Dominance Right   Extremity/Trunk Assessment Upper Extremity Assessment Upper Extremity Assessment: Overall WFL for tasks assessed   Lower Extremity Assessment Lower Extremity Assessment: Defer to PT evaluation       Communication Communication Communication: No difficulties   Cognition Arousal/Alertness: Awake/alert Behavior During Therapy: WFL for tasks assessed/performed Overall Cognitive Status: Impaired/Different from baseline Area of Impairment: Safety/judgement;Problem solving;Attention   Current Attention Level: Sustained     Safety/Judgement: Decreased awareness of safety   Problem Solving: Difficulty sequencing;Requires verbal cues General Comments: pt very talkative, required frequent redirection to remain on task, daughter reports this is her baseline   General Comments       Exercises       Shoulder Instructions      Home Living Family/patient expects to be discharged to:: Private  residence Living Arrangements: Spouse/significant other Available Help at Discharge: Family;Available 24 hours/day (daughters can also assist) Type of Home: House Home Access: Stairs to enter [employer] of Steps:  3 Entrance Stairs-Rails: Right Home Layout: One level     Bathroom Shower/Tub: Teacher, early years/pre: Standard     Home Equipment: Environmental consultant - 2 wheels;Cane - single point;Bedside commode;Wheelchair - manual          Prior Functioning/Environment Level of Independence: Independent with assistive device(s)        Comments: walks with a cane as needed        OT Problem List: Impaired balance (sitting and/or standing);Decreased cognition;Decreased safety awareness;Pain;Obesity      OT Treatment/Interventions: Self-care/ADL training;DME and/or AE instruction;Therapeutic activities;Balance training;Patient/family education;Cognitive remediation/compensation    OT Goals(Current goals can be found in the care plan section) Acute Rehab OT Goals Patient Stated Goal: to return home OT Goal Formulation: With patient Time For Goal Achievement: 03/06/16 Potential to Achieve Goals: Good ADL Goals Pt Will Perform Grooming: with supervision;standing Pt Will Perform Lower Body Bathing: with supervision;sit to/from stand Pt Will Perform Lower Body Dressing: with supervision;sit to/from stand Pt Will Transfer to Toilet: with supervision;ambulating;regular height toilet Pt Will Perform Toileting - Clothing Manipulation and hygiene: with supervision;sit to/from stand Pt Will Perform Tub/Shower Transfer: Tub transfer;with supervision;ambulating  OT Frequency: Min 2X/week   Barriers to D/C:            Co-evaluation PT/OT/SLP Co-Evaluation/Treatment: Yes Reason for Co-Treatment: For patient/therapist safety   OT goals addressed during session: ADL's and self-care      End of Session Equipment Utilized During Treatment: Gait belt  Activity Tolerance: Patient tolerated treatment well Patient left: in bed;with call bell/phone within reach;with family/visitor present;with nursing/sitter in room  OT Visit Diagnosis: Unsteadiness on feet (R26.81);Other abnormalities of gait  and mobility (R26.89);Pain;Other symptoms and signs involving cognitive function Pain - Right/Left: Left Pain - part of body: Hip;Knee                ADL either performed or assessed with clinical judgement  Time: 0383-3383 OT Time Calculation (min): 20 min Charges:  OT General Charges $OT Visit: 1 Procedure OT Evaluation $OT Eval Moderate Complexity: 1 Procedure G-Codes: OT G-codes **NOT FOR INPATIENT CLASS** Functional Assessment Tool Used: Clinical judgement Functional Limitation: Self care Self Care Current Status (A9191): At least 20 percent but less than 40 percent impaired, limited or restricted Self Care Goal Status (Y6060): At least 1 percent but less than 20 percent impaired, limited or restricted   Malka So 02/28/2016, 3:43 PM  [phone]

## 2016-02-28 NOTE — Progress Notes (Signed)
Hypoglycemic Event  CBG: 65  Treatment: Dextrose 50% 25 ml  Symptoms: none  Follow-up CBG: Time: 0259 CBG Result: 149  Possible Reasons for Event: NPO  Comments/MD notified:    Rahim Astorga, [place] Reveal

## 2016-02-28 NOTE — Progress Notes (Signed)
VASCULAR LAB PRELIMINARY  PRELIMINARY  PRELIMINARY  PRELIMINARY  Carotid duplex completed.    Preliminary report:  1-39% ICA stenosis. Vertebral artery flow is antegrade.   Jceon Alverio, RVT 02/28/2016, 2:57 PM

## 2016-02-28 NOTE — Procedures (Signed)
Electroencephalogram (EEG) Report  Date of study: 02/28/16  Requesting clinician: Burnetta Sabin, NP  Reason for study: Evaluate for seizure  Brief clinical history: This is a 76 year old woman with acute ischemic stroke and reported sudden onset of lethargy. EEG is requested for further evaluation.  Medications:  Current Facility-Administered Medications:  .  iopamidol (ISOVUE-370) 76 % injection, , , ,  .  0.9 %  sodium chloride infusion, , Intravenous, Continuous, Toy Baker, MD, Last Rate: 75 mL/hr at 02/28/16 1400 .  [START ON 03/10/2016] abatacept (ORENCIA) 750 mg in sodium chloride 0.9 % 100 mL IVPB, 750 mg, Intravenous, Q28 days, Bo Merino, MD .  acetaminophen (TYLENOL) tablet 650 mg, 650 mg, Oral, Q4H PRN **OR** acetaminophen (TYLENOL) solution 650 mg, 650 mg, Per Tube, Q4H PRN **OR** acetaminophen (TYLENOL) suppository 650 mg, 650 mg, Rectal, Q4H PRN, Toy Baker, MD .  Derrill Memo ON 03/10/2016] acetaminophen (TYLENOL) tablet 650 mg, 650 mg, Oral, 60 min Pre-Op, Toy Baker, MD .  allopurinol (ZYLOPRIM) tablet 300 mg, 300 mg, Oral, Daily, Toy Baker, MD, 300 mg at 02/28/16 1035 .  aspirin suppository 300 mg, 300 mg, Rectal, Daily **OR** aspirin tablet 325 mg, 325 mg, Oral, Daily, Toy Baker, MD, 325 mg at 02/28/16 1035 .  atorvastatin (LIPITOR) tablet 40 mg, 40 mg, Oral, q1800, Maryann Mikhail, DO .  [START ON 03/10/2016] diphenhydrAMINE (BENADRYL) capsule 25 mg, 25 mg, Oral, 60 min Pre-Op, Bo Merino, MD .  insulin aspart (novoLOG) injection 0-9 Units, 0-9 Units, Subcutaneous, Q4H, Toy Baker, MD .  levothyroxine (SYNTHROID, LEVOTHROID) injection 75 mcg, 75 mcg, Intravenous, Daily, Toy Baker, MD, 75 mcg at 02/28/16 1032 .  MEDLINE mouth rinse, 15 mL, Mouth Rinse, BID, Cristal Ford, DO  Description: This is a routine EEG performed using standard international 10-20 electrode placement. A total of 18 channels are recorded,  including one for the EKG. Wakefulness and drowsiness  are recorded.   Activating Maneuvers: None  Findings:  The EKG channel demonstrates a regular rhythm with a rate of 50-60 beats per minute.   The background consists of well-formed alpha activity. The best dominant posterior rhythm is 9. This is symmetric and reacts as expected with eye opening.   There are no focal asymmetries. No epileptiform discharges are present. No seizures are recorded.   Drowsiness is recorded and is normal in appearance.   Impression: This is a normal awake and drowsy EEG.  Clinical correlation: This normal EEG does not exclude the possibility of seizure or epilepsy. Clinical correlation is required.   Melba Coon, MD [facility]

## 2016-02-28 NOTE — Evaluation (Signed)
Physical Therapy Evaluation Patient Details Name: [name] MRN: [id number] DOB: November 29, 1940 Today's Date: 02/28/2016   History of Present Illness  Pt admitted with suddent onset of lethargy and diaphoresis. + punctuate foci of diffusion restriction within paracentral pons. PMH: arthritis, aortic stenosis, DM, HTN, CKD, gout, chronic pain, DVT, peripheral neuropathy, CHF.  Clinical Impression  Pt presented sitting OOB on BSC when therapists entered room. Prior to admission, pt reported that she was independent with all functional mobility and ADLs with the exception of the occasional use of a SPC. Pt currently requires mod A for bed mobility, min A for transfers and min A for stability with ambulation. All VSS throughout. Pt would continue to benefit from skilled physical therapy services at this time while admitted and after d/c to address her below listed limitations in order to improve her overall safety and independence with functional mobility.      Follow Up Recommendations Home health PT;Supervision/Assistance - 24 hour    Equipment Recommendations  None recommended by PT    Recommendations for Other Services       Precautions / Restrictions Precautions Precautions: Fall Restrictions Weight Bearing Restrictions: No      Mobility  Bed Mobility Overal bed mobility: Needs Assistance Bed Mobility: Sit to Supine       Sit to supine: Mod assist   General bed mobility comments: assist to move LEs back into bed  Transfers Overall transfer level: Needs assistance Equipment used: 1 person hand held assist Transfers: Sit to/from Stand Sit to Stand: Min assist         General transfer comment: increased time, slow sequencing, min A for stability  Ambulation/Gait Ambulation/Gait assistance: Min assist Ambulation Distance (Feet): 20 Feet Assistive device: 1 person hand held assist Gait Pattern/deviations: Step-through pattern;Decreased step length -  right;Decreased step length - left;Decreased stride length;Drifts right/left Gait velocity: decreased Gait velocity interpretation: Below normal speed for age/gender General Gait Details: moderately unstable with ambulation requiring constant min A for balance  Stairs            Wheelchair Mobility    Modified Rankin (Stroke Patients Only)       Balance Overall balance assessment: Needs assistance Sitting-balance support: Feet supported Sitting balance-Leahy Scale: Fair     Standing balance support: During functional activity;Single extremity supported Standing balance-Leahy Scale: Poor Standing balance comment: pt with posterior LOB requiring mod A to recover while performing peri care after use of BSC                             Pertinent Vitals/Pain Pain Assessment: Faces Pain Score: 0-No pain Faces Pain Scale: Hurts little more Pain Location: R hip and bilateral knees (chronic) Pain Descriptors / Indicators: Sore Pain Intervention(s): Monitored during session;Repositioned    Home Living Family/patient expects to be discharged to:: Private residence Living Arrangements: Spouse/significant other Available Help at Discharge: Family;Available 24 hours/day (daughters can also assist) Type of Home: House Home Access: Stairs to enter Entrance Stairs-Rails: Right Entrance Stairs-Number of Steps: 3 Home Layout: One level Home Equipment: Walker - 2 wheels;Cane - single point;Bedside commode;Wheelchair - manual      Prior Function Level of Independence: Independent with assistive device(s)         Comments: walks with a cane as needed     Hand Dominance   Dominant Hand: Right    Extremity/Trunk Assessment   Upper Extremity Assessment Upper Extremity Assessment: Defer to OT  evaluation    Lower Extremity Assessment Lower Extremity Assessment: Generalized weakness       Communication   Communication: No difficulties  Cognition  Arousal/Alertness: Awake/alert Behavior During Therapy: WFL for tasks assessed/performed Overall Cognitive Status: Impaired/Different from baseline Area of Impairment: Safety/judgement;Problem solving;Attention   Current Attention Level: Sustained     Safety/Judgement: Decreased awareness of safety   Problem Solving: Difficulty sequencing;Requires verbal cues General Comments: pt very talkative, required frequent redirection to remain on task, daughter reports this is her baseline    General Comments      Exercises     Assessment/Plan    PT Assessment Patient needs continued PT services  PT Problem List Decreased strength;Decreased activity tolerance;Decreased balance;Decreased coordination;Decreased mobility;Decreased knowledge of use of DME;Decreased safety awareness;Decreased cognition       PT Treatment Interventions DME instruction;Gait training;Stair training;Functional mobility training;Therapeutic activities;Therapeutic exercise;Balance training;Neuromuscular re-education;Cognitive remediation;Patient/family education    PT Goals (Current goals can be found in the Care Plan section)  Acute Rehab PT Goals Patient Stated Goal: to return home PT Goal Formulation: With patient/family Time For Goal Achievement: 03/13/16 Potential to Achieve Goals: Good    Frequency Min 3X/week   Barriers to discharge        Co-evaluation PT/OT/SLP Co-Evaluation/Treatment: Yes Reason for Co-Treatment: For patient/therapist safety PT goals addressed during session: Mobility/safety with mobility;Balance;Proper use of DME OT goals addressed during session: ADL's and self-care       End of Session Equipment Utilized During Treatment: Gait belt Activity Tolerance: Patient tolerated treatment well Patient left: in bed;with call bell/phone within reach;with nursing/sitter in room;with family/visitor present Nurse Communication: Mobility status PT Visit Diagnosis: Unsteadiness on feet  (R26.81);Other abnormalities of gait and mobility (R26.89)    Functional Assessment Tool Used: AM-PAC 6 Clicks Basic Mobility;Clinical judgement Functional Limitation: Mobility: Walking and moving around Mobility: Walking and Moving Around Current Status (Q4696): At least 1 percent but less than 20 percent impaired, limited or restricted Mobility: Walking and Moving Around Goal Status [phone]): 0 percent impaired, limited or restricted    Time: 4132-4401 PT Time Calculation (min) (ACUTE ONLY): 20 min   Charges:   PT Evaluation $PT Eval Moderate Complexity: 1 Procedure     PT G Codes:   PT G-Codes **NOT FOR INPATIENT CLASS** Functional Assessment Tool Used: AM-PAC 6 Clicks Basic Mobility;Clinical judgement Functional Limitation: Mobility: Walking and moving around Mobility: Walking and Moving Around Current Status (U2725): At least 1 percent but less than 20 percent impaired, limited or restricted Mobility: Walking and Moving Around Goal Status [phone]): 0 percent impaired, limited or restricted     [facility] 02/28/2016, 4:51 PM Sherie Don, [place], DPT [phone]

## 2016-02-28 NOTE — Care Management Obs Status (Signed)
[place] NOTIFICATION   Patient Details  Name: [name] MRN: [id number] Date of Birth: September 29, 1940   Medicare Observation Status Notification Given:  Yes    Carles Collet, RN 02/28/2016, 12:19 PM

## 2016-02-28 NOTE — Progress Notes (Signed)
VASCULAR LAB PRELIMINARY  PRELIMINARY  PRELIMINARY  PRELIMINARY  Bilateral lower extremity venous duplex completed.    Preliminary report:  There is no DVT or SVT noted in the bilateral lower extremities.   Adiyah Lame, RVT 02/28/2016, 2:56 PM

## 2016-02-28 NOTE — Consult Note (Signed)
ELECTROPHYSIOLOGY CONSULT NOTE    Patient ID: [name] MRN: [id number], DOB/AGE: 1940-08-25 76 y.o.  Admit date: 02/27/2016 Date of Consult: 02/28/2016   Primary Physician: Lilian Coma, MD Primary Cardiologist: Dr. Marlou Porch Requesting MD: Dr. Leonie Man  Reason for Consultation: bradycardia, syncope  HPI: [name] is a 76 y.o. female with PMHx of CRI, HTN, DM, arthritis, no known CAD with negative cath in 2015, DVT in 2015 tx with xarelto, remote hx of a syncopale event appears no clear etiology noted after w/u, she had a mechanical fall a few days ago with shoulder injury and started in steroid dose pack by ortho on the 19th with rheumatoid flare.  She was brought to [facility] via EMS after a witnessed syncopal event, Initially code stroke called, neurology noted she reportedly became acutely diaphoretic, dizzy, and weak all over. Her friend called 911. EMS arrived and found the patient to be somewhat lethargic, c/o dizziness, headache, and L foot numbness. She was transported to the ED. neuro met her on arrival with the rest of the stroke team. She was taken for an emergent CTH which showed no acute abnormality. On exam, she was encephalopathic, c/o headache and L shoulder pain. She denies any focal weakness or numbness. No focal deficits were identified on her exam with NIHSS score 0. There was little to support stroke at this time so CODE STROKE was canceled.   Recently subjective c/o body aches/chills, malaise but in the last couple days prior to coming in actually feeling much better, yesterday morning she had made plans to go out and run errands.  She had an unusual head ache though and had mentioned it to her son, and once in the morning felt like the headache made her feel a little off or dizzy.  She drove to a friends house to get together, while there her head ache intensified and she felt poorly, and felt the need to use the BR, went into the restroom and the next thing  she recalls an EMS person was attending to her.   She Has had no CP, palpitations or SOB of late or at all.  The daughter at bedside today was not there when the event happened, but states when she arrived to the ER the patient was "out of it" very lethargic, did not know who she was, was difficult to keep awake, she mentioned the RN had come in a couple times and rubbed on her chest because her HR was slow, her mom would arouse but remained quite lethargic until hours later, she states that when her sister arrived she seemed to recognize and respond to her better, but did not have clear improvement until about 2AM (event occurred approx 4PM) and she states that suddenly she was just "back to herself".    In lengthy d/w the patient and daughter, going back as many as 5 years she has noted episode where her Mom is "blank" but not unconscious and then suddenly becomes aware again, in the last 2 years she has had a number of fall episodes, seems most have had clear mechanical explanations, though at least a couple the patient is not certain just finds herself on the floor. The last of these occurred in the fall, she got out of bed to grab a snack, when in the kitchen said she felt funny, maybe dizzy or spinning, and then was on the floor.  Her daughter says that night she had been to visit and she was fine,  later that evening she got a call from the house about 11:30PM but no one would answer from the other end.  About 1 minute later another call from her mother's home number but no response, and a couple minutes later the same thing so she rushed over and found her Mom on the kitchen flor seems a little confused, required help getting up and could not tell her what happend.  The patient denies any kind of CP or palpitations.  LABS; K+ 4.5 BUN/Creat 29/2.00 >> 16/1.29 H/H 10.8/32.9 WBC 7.7 plts 268 poc trop 0.00 Trop I <0.03 TSH 14.791 tx with IV synthroid D-dimer 3.15 and 5.97  HOME MEDS include  lopressor 7m BID held on admission  Past Medical History:  Diagnosis Date  . Allergic rhinitis   . Anemia   . Aortic stenosis 12/22/2012   Mild 3/14-2.35 m/s peak velocity  . CHF (congestive heart failure) ([facility]   . Chronic pain   . CKD (chronic kidney disease), stage III   . Diabetes mellitus   . Diverticulosis   . DVT (deep venous thrombosis) ([facility]   . Glaucoma   . Hemorrhoids    internal  . HTN (hypertension)   . Hypercholesteremia   . Hyperlipidemia 12/22/2012  . Hypothyroidism   . Meningioma ([facility] 2008  . Osteoarthritis   . Peripheral neuropathy ([facility]   . PPD positive    6 months ago  . PVC (premature ventricular contraction) 12/22/2012  . Rheumatoid arthritis(714.0)   . Sickle cell trait ([facility]   . 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 ABDOMINAL HYSTERECTOMY  1978   TAH,& BSO 1  . fistula repair  03/2010   partial colectomy  . intracranial surgery  11/2007   removal of meningioma  . JOINT REPLACEMENT  2001   left TKR  . LEFT HEART CATHETERIZATION WITH CORONARY ANGIOGRAM N/A 08/08/2013   Procedure: LEFT HEART CATHETERIZATION WITH CORONARY ANGIOGRAM;  Surgeon: Peter M JMartinique MD;  [facility];  Service: Cardiovascular;  Laterality: N/A;  . ROTATOR CUFF REPAIR  1999   BIlateral  . SIGMOIDECTOMY  2012  . TONSILLECTOMY AND ADENOIDECTOMY    . TOTAL THYROIDECTOMY  1992  . wrist sx     right      Prescriptions Prior to Admission  Medication Sig Dispense Refill Last Dose  . Abatacept (ORENCIA IV) Inject into the vein. Daughters are not sure of doses. Received in the short stay area once a month   unknown at unknown  . acetaminophen (TYLENOL) 500 MG tablet Take 1,000 mg by mouth every 6 (six) hours as needed for mild pain.   unknown at unknown  . allopurinol (ZYLOPRIM) 300 MG tablet Take 1 tablet (300 mg total) by mouth  daily. 30 tablet 5 unknown at unknown  . aspirin EC 81 MG EC tablet Take 1 tablet (81 mg total) by mouth daily. 30 tablet 0 unknown at unknown  . cyclobenzaprine (FLEXERIL) 10 MG tablet Take 10 mg by mouth at bedtime.    unknown at unknown  . diclofenac sodium (VOLTAREN) 1 % GEL 3 g to 3 larges joints up to three times daily (Patient taking differently: Apply 2 g topically 3 (three) times daily. 3 g to 3 larges joints up to three times daily) 1 Tube 3 unknown at unknown  . ferrous sulfate (FEOSOL) 325 (  65 FE) MG tablet Take 325-650 mg by mouth daily with breakfast.   unknown at unknown  . folic acid (FOLVITE) 1 MG tablet Take 1 mg by mouth daily.   unknown at unknown  . insulin NPH-regular Human (NOVOLIN 70/30) (70-30) 100 UNIT/ML injection Inject 6-8 Units into the skin 3 (three) times daily with meals. Sliding source   unknown at unknown  . levothyroxine (SYNTHROID, LEVOTHROID) 137 MCG tablet Take 137 mcg by mouth daily before breakfast.   unknown at unknown  . losartan (COZAAR) 50 MG tablet Take 50 mg by mouth every evening.   unknown at unknown  . metoprolol tartrate (LOPRESSOR) 25 MG tablet Take 1 tablet (25 mg total) by mouth 2 (two) times daily as needed. (Patient taking differently: Take 25 mg by mouth daily. ) 60 tablet 6 unknown at 0800  . pregabalin (LYRICA) 75 MG capsule 1 pill in AM  &  2 at night 84 capsule 0 unknown at unknown  . VITAMIN E PO Take 1 tablet by mouth daily.   unknown at unknown  . atorvastatin (LIPITOR) 40 MG tablet Take 40 mg by mouth daily at 6 PM.    unknown at unknown  . BD INSULIN SYRINGE ULTRAFINE 31G X 15/64" 0.3 ML MISC    Not Taking  . cholecalciferol (VITAMIN D) 1000 UNITS tablet Take 1,000 Units by mouth daily.    unknown at unknown  . HYDROcodone-acetaminophen (NORCO/VICODIN) 5-325 MG per tablet Take 1-2 tablets by mouth every 3 (three) hours as needed for moderate pain. (Patient not taking: Reported on 02/27/2016) 30 tablet 0 Not Taking at unknown  .  multivitamin (THERAGRAN) per tablet Take 1 tablet by mouth daily.    unknown at unknown  . predniSONE (DELTASONE) 5 MG tablet 4 tablets for 4 days, 3 for 4 days, 2 for 4 days, 1 for 4 days and half for 4 days. (Patient not taking: Reported on 02/25/2016) 42 tablet 0 Not Taking at Unknown time  . predniSONE (DELTASONE) 5 MG tablet 4po qAM x 3 days, 3po qAM x 3 days, 2po qAM x 3 days, 1po qAM x 3 days,then STOP; Disp 30 pills; (Patient not taking: Reported on 02/27/2016) 30 tablet 0 Not Taking at Unknown time    Inpatient Medications:  . [START ON 03/10/2016] abatacept (ORENCIA) IV  750 mg Intravenous Q28 days  . [START ON 03/10/2016] acetaminophen  650 mg Oral 60 min Pre-Op  . allopurinol  300 mg Oral Daily  . aspirin  300 mg Rectal Daily   Or  . aspirin  325 mg Oral Daily  . atorvastatin  40 mg Oral q1800  . [START ON 03/10/2016] diphenhydrAMINE  25 mg Oral 60 min Pre-Op  . insulin aspart  0-9 Units Subcutaneous Q4H  . levothyroxine  75 mcg Intravenous Daily  . mouth rinse  15 mL Mouth Rinse BID    Allergies:  Allergies  Allergen Reactions  . Methotrexate Derivatives Other (See Comments)    Elevated creat. Levels  . Other Other (See Comments)    NO Blood products.  [name]- causes low blood sugar (pt states she was taking medication but cant remember what it was for.)  . Penicillins Rash    Has patient had a PCN reaction causing immediate rash, facial/tongue/throat swelling, SOB or lightheadedness with hypotension: Yes Has patient had a PCN reaction causing severe rash involving mucus membranes or skin necrosis: Yes  Has patient had a PCN reaction that required hospitalization No Has patient had a PCN  reaction occurring within the last 10 years: No If all of the above answers are "NO", then may proceed with Cephalosporin use.   . Enbrel [Etanercept] Other (See Comments)    Inadequate response    . Remicade [Infliximab] Other (See Comments)    Inadequate response   . Zocor  [Simvastatin] Other (See Comments)    Leg cramps     Social History   Social History  . Marital status: Married    Spouse name: N/A  . Number of children: N/A  . Years of education: N/A   Occupational History  . Not on file.   Social History Main Topics  . Smoking status: Former Smoker    Packs/day: 0.10    Years: 2.00    Types: Cigarettes    Quit date: 01/06/1962  . Smokeless tobacco: Never Used     Comment: Socially   . Alcohol use Yes     Comment: occasionally  . Drug use: No  . Sexual activity: Not on file   Other Topics Concern  . Not on file   Social History Narrative   Daycare Provider     Family History  Problem Relation Age of Onset  . Hypotension Mother   . CVA Mother   . Diabetes Mother   . Hypertension Father   . Kidney failure Father      Review of Systems: All other systems reviewed and are otherwise negative except as noted above.  Physical Exam: Vitals:   02/28/16 0135 02/28/16 0316 02/28/16 0432 02/28/16 0749  BP: (!) 171/41  (!) 138/50 (!) 161/60  Pulse:   (!) 47 (!) 47  Resp: _0 Temp: (!) 96.5 F (35.8 C) 98.5 F (36.9 C)  97.8 F (36.6 C)  TempSrc: Oral Oral  Oral  SpO2: 100%  97% 100%  Weight: 184 lb 8.4 oz (83.7 kg)     Height: _1  (1.626 m)       GEN- The patient is elderly, well appearing, alert and oriented x 3 today.   HEENT: normocephalic, atraumatic; sclera clear, conjunctiva pink; hearing intact; oropharynx clear; neck supple, no JVP Lymph- no cervical lymphadenopathy Lungs-  CTA b/l, normal work of breathing.  No wheezes, rales, rhonchi Heart- RRR, no significant murmurs, rubs or gallops, PMI not laterally displaced GI- soft, non-tender, non-distended, bowel sounds present Extremities- no clubbing, cyanosis, or edema MS- no significant deformity or atrophy Skin- warm and dry, no rash or lesion Psych- euthymic mood, full affect Neuro- no gross deficits observed  Labs:   Lab Results  Component Value  Date   WBC 6.4 02/28/2016   HGB 10.8 (L) 02/28/2016   HCT 32.6 (L) 02/28/2016   MCV 86.2 02/28/2016   PLT 206 02/28/2016    Recent Labs Lab 02/28/16 0434  NA 140  K 4.2  CL 110  CO2 22  BUN 16  CREATININE 1.29*  CALCIUM 9.3  PROT 6.8  BILITOT 0.9  ALKPHOS 50  ALT 12*  AST 15  GLUCOSE 91      Radiology/Studies:  Dg Chest 2 View Result Date: 02/27/2016 CLINICAL DATA:  76 year old female with transient ischemic attack. EXAM: CHEST  2 VIEW COMPARISON:  Chest radiograph dated 02/04/2016 FINDINGS: Minimal left lung base atelectatic changes. No focal consolidation, pleural effusion, or pneumothorax. The cardiac silhouette is within normal limits. No acute osseous pathology. IMPRESSION: No active cardiopulmonary disease. Electronically Signed   By: Anner Crete M.D.   On: 02/27/2016 23:33   Dg  Ribs Unilateral W/chest Left Result Date: 02/04/2016 CLINICAL DATA:  [place] 1 week ago with left anterior rib pain. EXAM: LEFT RIBS AND CHEST - 3+ VIEW COMPARISON:  10/18/2015 FINDINGS: Cardiomediastinal silhouette is normal. The right chest is clear. There is mild atelectasis at the left lung base. There is no visible rib fracture. Cartilage injury not excluded. IMPRESSION: Mild atelectasis at the left lung base. No rib fracture seen. Cartilage injury not excluded. Electronically Signed   By: Nelson Chimes M.D.   On: 02/04/2016 15:49     Dg Humerus Left Result Date: 02/04/2016 CLINICAL DATA:  [place] 1 week ago.  Upper arm pain. EXAM: LEFT HUMERUS - 2+ VIEW COMPARISON:  MRI 12/29/2015.  Radiography 05/30/2015. FINDINGS: No evidence of fracture or dislocation. 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 These include narrowed humeral acromial distance and laterally sloping acromion with sclerotic change. IMPRESSION: No acute finding. 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 Electronically Signed   By: Nelson Chimes M.D.   On: 02/04/2016 15:50   Mr Jodene Nam  Head/brain NL Cm  Result Date: 02/28/2016 CLINICAL DATA:  Sudden onset lethargy. Dizziness and left foot numbness. EXAM: MRI HEAD WITHOUT CONTRAST MRA HEAD WITHOUT CONTRAST TECHNIQUE: Multiplanar, multiecho pulse sequences of the brain and surrounding structures were obtained without intravenous contrast. Angiographic images of the head were obtained using MRA technique without contrast. COMPARISON:  Head CT 02/27/2016 Brain MRI 07/27/2014 FINDINGS: MRI HEAD FINDINGS Brain: There are 2 punctate foci of diffusion restriction within the left paracentral pons (series 3, image 15). No other diffusion restriction. No intracranial hemorrhage. There is multifocal hyperintense T2-weighted signal within the periventricular white matter, most often seen in the setting of chronic microvascular ischemia. No mass lesion or midline shift. No hydrocephalus or extra-axial fluid collection. The midline structures are normal. No age advanced or lobar predominant atrophy. Vascular: Major intracranial arterial and venous sinus flow voids are preserved. Chronic micro hemorrhages in the anterior right temporal lobe are unchanged. Skull and upper cervical spine: Remote right frontoparietal craniotomy. Sinuses/Orbits: No fluid levels or advanced mucosal thickening. No mastoid effusion. Normal orbits. MRA HEAD FINDINGS Intracranial internal carotid arteries: Normal. Anterior cerebral arteries: Hypoplastic right A1 segment is a normal congenital variant. The anterior cerebral arteries are otherwise normal. Middle cerebral arteries: Normal. Posterior communicating arteries: Absent bilaterally. Posterior cerebral arteries: Normal. Basilar artery: Normal. Vertebral arteries: Codominant. Normal. Superior cerebellar arteries: Normal. Anterior inferior cerebellar arteries: Normal. Posterior inferior cerebellar arteries: Normal. IMPRESSION: 1. Two punctate foci of acute ischemia within the left paracentral pons. No acute hemorrhage or mass  effect. 2. Normal intracranial MRA. Electronically Signed   By: Ulyses Jarred M.D.   On: 02/28/2016 01:01   Ct Head Code Stroke W/o Cm Result Date: 02/27/2016 CLINICAL DATA:  Code stroke. 76 year old female last seen normal at 1530 hours. Dizziness and left foot numbness. Initial encounter. Personal history of meningioma resection multiple years ago. EXAM: CT HEAD WITHOUT CONTRAST TECHNIQUE: Contiguous axial images were obtained from the base of the skull through the vertex without intravenous contrast. COMPARISON:  Brain MRI 07/27/2014.  Head CT 11/21/2009. FINDINGS: Brain: Cerebral volume remains normal for age. No midline shift, ventriculomegaly, mass effect, evidence of mass lesion, intracranial hemorrhage or evidence of cortically based acute infarction. Gray-white matter differentiation is within normal limits throughout the brain. No cortical encephalomalacia identified. Vascular: Calcified atherosclerosis at the skull base. No suspicious intracranial vascular hyperdensity. Dural calcifications. Skull: Stable previous right frontotemporal craniotomy. No acute osseous  abnormality identified. Sinuses/Orbits: Visualized paranasal sinuses and mastoids are stable and well pneumatized; chronic bubbly opacity in the left sphenoid is mildly improved compared to 2011. Other: No acute orbit or scalp soft tissue findings. ASPECTS ([place] Stroke Program Early CT Score) - Ganglionic level infarction (caudate, lentiform nuclei, internal capsule, insula, M1-M3 cortex): 7 - Supraganglionic infarction (M4-M6 cortex): 3 Total score (0-10 with 10 being normal): 10 IMPRESSION: 1. Stable and normal for age noncontrast CT appearance of the brain other than remote right frontotemporal craniotomy. 2. ASPECTS is 10. 3. The above was relayed via text pager to Dr.Oster on 02/27/2016 at 16:57 . Electronically Signed   By: Genevie Ann M.D.   On: 02/27/2016 16:58    EKG: EKG is SB, 50bpm, PR 180m, QRS 832mQTc 42922mELEMETRY: SR/SB,  40's-70's Cardiac catheterization 08/08/13-normal coronary arteries Event monitor 2/14 showed no adverse arrhythmias.PVC's ECHO 10/19/15   Study Conclusions - Left ventricle: The cavity size was normal. Systolic function was normal. The estimated ejection fraction was in the range of 55% to 60%. Wall motion was normal; there were no regional wall motion abnormalities. Left ventricular diastolic function parameters were normal. - Aortic valve: There was mild stenosis. There was mild regurgitation. Valve area (VTI): 1.25 cm^2. Valve area (Vmax): 1.01 cm^2. Valve area (Vmean): 1.15 cm^2. - Mitral valve: There was mild regurgitation. - Left atrium: The atrium was mildly dilated. - Right atrium: The atrium was mildly dilated.   NUCLEAR STRESS TEST 10/21/15.  FINDINGS: Perfusion: Small fixed perfusion defect at the septal wall towards the apex. No reversible perfusion defect identified. Wall Motion: Normal left ventricular wall motion. No left ventricular dilation Left Ventricular Ejection Fraction: 53 % End diastolic volume [id number] End systolic volume 50 m IMPRESSION: 1. Small fixed perfusion defect at the septal wall towards the apex. No reversible perfusion defect. 2. Normal left ventricular wall motion. 3. Left ventricular ejection fraction 53% 4. Non invasive risk stratification*: Low  Assessment and Plan:   1. Syncope followed by hours of encephalopathy      Neuro w/u remains in progress, just about to start EEG      Uncertain her bradycardia explains the picture in it's entirety While I am with her, her HR ranges 42-55 and she is feeling and mentating quite well Her daughter reports a sudden improvement/resolution of her lethargy like "night and day" without any clear change in her HR.  Her BP has been stable  She has had historical episodes as well that her daughter thought seems similar to her son's seizures, with blank episodes, then suddenly back to normal  without LOC.  In the fall she was able to call her daughter but unable to speak to her after a syncopal episode.  Agree to holding her BB, it has been about 24 hours off, would continue to monitor HR  Dr. AllRayann Heman see   Signed, RenTommye StandardA-C 02/28/2016 10:47 AM  I have seen, examined the patient, and reviewed the above assessment and plan.  On exam, RRR.   Changes to above are made where necessary.  Pt with chronic bradycardia.  Story not suggestive of bradycardia related syncope.  I would advise stopping metoprolol.  30 day event monitor.  Heart rates are currently better.  No indication for pacing currently.  Follow-up with EP PA-C in the office in 6 weeks.  Electrophysiology team to see as needed while here. Please call with questions.   Co Sign: JamThompson GrayerD 02/28/2016 3:35 PM

## 2016-02-28 NOTE — Evaluation (Signed)
Speech Language Pathology Evaluation Patient Details Name: [name] MRN: [id number] DOB: Aug 23, 1940 Today's Date: 02/28/2016 Time: 0920-0940 SLP Time Calculation (min) (ACUTE ONLY): 20 min  Problem List:  Patient Active Problem List   Diagnosis Date Noted  . Acute encephalopathy 02/27/2016  . Rheumatoid arthritis ([place]) 01/17/2016  . High risk medications (not anticoagulants) long-term use 01/17/2016  . Bilateral hand pain 01/17/2016  . Bilateral wrist pain 01/17/2016  . Pain in right elbow 01/17/2016  . Left shoulder pain 01/17/2016  . Encounter for therapeutic drug monitoring 11/02/2015  . Bronchiectasis without complication ([place]) 85/88/5027  . Chronic gout involving toe without tophus 10/30/2015  . High risk medication use 10/29/2015  . Gout 10/29/2015  . H/O total knee replacement, left 10/29/2015  . History of positive PPD 10/29/2015  . Diabetes mellitus, type II, insulin dependent ([place]) 10/18/2015  . Rheumatoid arthritis flare ([place]) 10/18/2015  . AKI (acute kidney injury) ([place]) 10/18/2015  . CKD (chronic kidney disease) stage 3, GFR 30-59 ml/min 10/18/2015  . Nodule of left lung 10/18/2015  . Neuropathy ([place]) 08/22/2014  . Hypothyroidism 08/22/2014  . Chronic pain 08/22/2014  . Chronic congestive heart failure with left ventricular diastolic dysfunction ([place]) 08/08/2013  . Chest pain 08/05/2013  . Aortic stenosis 12/22/2012  . Hyperlipidemia 12/22/2012  . PVC (premature ventricular contraction) 12/22/2012  . HTN (hypertension) 12/22/2012  . Open angle with borderline findings, high risk 12/15/2011  . Cataract, nuclear 05/25/2011   Past Medical History:  Past Medical History:  Diagnosis Date  . Allergic rhinitis   . Anemia   . Aortic stenosis 12/22/2012   Mild 3/14-2.35 m/s peak velocity  . CHF (congestive heart failure) ([place])   . Chronic pain   . CKD (chronic kidney disease), stage III   . Diabetes mellitus   . Diverticulosis   . DVT (deep venous  thrombosis) ([place])   . Glaucoma   . Hemorrhoids    internal  . HTN (hypertension)   . Hypercholesteremia   . Hyperlipidemia 12/22/2012  . Hypothyroidism   . Meningioma ([place]) 2008  . Osteoarthritis   . Peripheral neuropathy ([place])   . PPD positive    6 months ago  . PVC (premature ventricular contraction) 12/22/2012  . Rheumatoid arthritis(714.0)   . Sickle cell trait ([place])   . 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 ABDOMINAL HYSTERECTOMY  1978   TAH,& BSO 1  . fistula repair  03/2010   partial colectomy  . intracranial surgery  11/2007   removal of meningioma  . JOINT REPLACEMENT  2001   left TKR  . LEFT HEART CATHETERIZATION WITH CORONARY ANGIOGRAM N/A 08/08/2013   Procedure: LEFT HEART CATHETERIZATION WITH CORONARY ANGIOGRAM;  Surgeon: Peter M Martinique, MD;  Location: [facility] CATH LAB;  Service: Cardiovascular;  Laterality: N/A;  . ROTATOR CUFF REPAIR  1999   BIlateral  . SIGMOIDECTOMY  2012  . TONSILLECTOMY AND ADENOIDECTOMY    . TOTAL THYROIDECTOMY  1992  . wrist sx     right    HPI:  Julia O Bemburyis a 76 y.o.femalewith medical history significant of arthritis, mild aortic stenosis, hypertension, DM 2 insulin-dependent, CKD, gout, chronic pain. History of DVT, H OD, hypothyroidism, peripheral neuropathy,chronic diastolic CHF. Presented with sudden onset of lethargy and diaphoresis no LOC she was with her friend at a time and this was witnessed.  There are 2 punctate foci of diffusion restriction within the left paracentral pons   Assessment / Plan / Recommendation Clinical Impression  Pt demonstrates cognitive linguistic function WNL. No SLP f/u needed will sign off.     SLP Assessment    WNL   Follow Up Recommendations    None   Frequency and Duration           SLP Evaluation Cognition  Overall Cognitive Status: Within  Functional Limits for tasks assessed Orientation Level: Oriented X4       Comprehension  Auditory Comprehension Overall Auditory Comprehension: Appears within functional limits for tasks assessed    Expression Verbal Expression Overall Verbal Expression: Appears within functional limits for tasks assessed   Oral / Motor  Oral Motor/Sensory Function Overall Oral Motor/Sensory Function: Within functional limits Motor Speech Overall Motor Speech: Appears within functional limits for tasks assessed   GO          Functional Assessment Tool Used: (P) clinical judgement Functional Limitations: (P) Motor speech         Marlynn Hinckley, Katherene Ponto 02/28/2016, 9:46 AM

## 2016-02-29 ENCOUNTER — Other Ambulatory Visit: Payer: Self-pay | Admitting: Physician Assistant

## 2016-02-29 DIAGNOSIS — I639 Cerebral infarction, unspecified: Secondary | ICD-10-CM

## 2016-02-29 LAB — VAS US CAROTID
LEFT ECA DIAS: -6 cm/s
LEFT VERTEBRAL DIAS: -6 cm/s
LICADSYS: -96 cm/s
LICAPSYS: -89 cm/s
Left CCA dist dias: 11 cm/s
Left CCA dist sys: 73 cm/s
Left ICA dist dias: -18 cm/s
Left ICA prox dias: -16 cm/s
RCCADSYS: -76 cm/s
RCCAPSYS: 76 cm/s
RIGHT ECA DIAS: -2 cm/s
RIGHT VERTEBRAL DIAS: 13 cm/s
Right CCA prox dias: 5 cm/s

## 2016-02-29 LAB — T3: T3 TOTAL: 40 ng/dL — AB (ref 71–180)

## 2016-02-29 LAB — GLUCOSE, CAPILLARY
GLUCOSE-CAPILLARY: 125 mg/dL — AB (ref 65–99)
GLUCOSE-CAPILLARY: 158 mg/dL — AB (ref 65–99)
GLUCOSE-CAPILLARY: 99 mg/dL (ref 65–99)
Glucose-Capillary: 133 mg/dL — ABNORMAL HIGH (ref 65–99)

## 2016-02-29 LAB — CBC
HEMATOCRIT: 27.8 % — AB (ref 36.0–46.0)
Hemoglobin: 9.1 g/dL — ABNORMAL LOW (ref 12.0–15.0)
MCH: 28.3 pg (ref 26.0–34.0)
MCHC: 32.7 g/dL (ref 30.0–36.0)
MCV: 86.6 fL (ref 78.0–100.0)
Platelets: 222 10*3/uL (ref 150–400)
RBC: 3.21 MIL/uL — ABNORMAL LOW (ref 3.87–5.11)
RDW: 17.4 % — AB (ref 11.5–15.5)
WBC: 6.3 10*3/uL (ref 4.0–10.5)

## 2016-02-29 LAB — HEMOGLOBIN A1C
Hgb A1c MFr Bld: 6.5 % — ABNORMAL HIGH (ref 4.8–5.6)
Mean Plasma Glucose: 140

## 2016-02-29 LAB — BASIC METABOLIC PANEL
Anion gap: 8 (ref 5–15)
BUN: 16 mg/dL (ref 6–20)
CALCIUM: 8.7 mg/dL — AB (ref 8.9–10.3)
CO2: 19 mmol/L — AB (ref 22–32)
Chloride: 112 mmol/L — ABNORMAL HIGH (ref 101–111)
Creatinine, Ser: 1.31 mg/dL — ABNORMAL HIGH (ref 0.44–1.00)
GFR calc non Af Amer: 38 mL/min — ABNORMAL LOW (ref >60)
GFR, EST AFRICAN AMERICAN: 44 mL/min — AB (ref >60)
Glucose, Bld: 144 mg/dL — ABNORMAL HIGH (ref 65–99)
Potassium: 4.7 mmol/L (ref 3.5–5.1)
SODIUM: 139 mmol/L (ref 135–145)

## 2016-02-29 MED ORDER — DIVALPROEX SODIUM ER 500 MG PO TB24
500.0000 mg | ORAL_TABLET | Freq: Every day | ORAL | Status: DC
  Administered 2016-02-29: 500 mg via ORAL
  Filled 2016-02-29: qty 1

## 2016-02-29 MED ORDER — LEVOTHYROXINE SODIUM 150 MCG PO TABS: 150.0000 ug | ORAL_TABLET | Freq: Every day | ORAL | Status: DC

## 2016-02-29 MED ORDER — DIVALPROEX SODIUM ER 500 MG PO TB24: 500.0000 mg | ORAL_TABLET | Freq: Every day | ORAL | 0 refills | Status: DC

## 2016-02-29 MED ORDER — HYDROCODONE-ACETAMINOPHEN 5-325 MG PO TABS
1.0000 | ORAL_TABLET | ORAL | Status: DC | PRN
  Administered 2016-02-29: 1 via ORAL
  Filled 2016-02-29: qty 1

## 2016-02-29 NOTE — Discharge Summary (Signed)
Physician Discharge Summary  [name] ION:[id number] DOB: 1940-04-16 DOA: 02/27/2016  PCP: Lilian Coma, MD  Admit date: 02/27/2016 Discharge date: 02/29/2016  Time spent: 45 minutes  Recommendations for Outpatient Follow-up:  Patient will be discharged to home with home health physical therapy.   Patient will need to follow up with primary care provider within one week of discharge.   You will need to pick up an event monitor, cardiology will contact you.  Follow up with Dr. Rayann Heman, cardiologist, in 6 weeks.    Follow up with Dr. Leonie Man, neurologist in 6 weeks, the office will call you with an appointment. Patient should continue medications as prescribed.   Patient should follow a heart healthy/carb modified diet.  Do not drive.  Discharge Diagnoses:  Syncope follow by Acute Encephalopathy Bradycardia Acute CVA Elevate DDimer Hypothyroidism Chronic kidney disease, stage III Essential hypertension Chronic pain Diabetes mellitus, type II Chronic anemia Hepatic hypodenisity  Discharge Condition: Stable  Diet recommendation: heart healthy  Filed Weights   02/27/16 1600 02/28/16 0135  Weight: 84.4 kg (186 lb 1.1 oz) 83.7 kg (184 lb 8.4 oz)    History of present illness:  On 02/27/2016 by Dr. Baird Cancer Bemburyis a 76 y.o.femalewith medical history significant of arthritis, mild aortic stenosis, hypertension, DM 2 insulin-dependent, CKD, gout, chronic pain History of DVT, H OD, hypothyroidism, peripheral neuropathy,chronic diastolic CHF  Presented with sudden onset of lethargy and diaphoresis no LOC she was with her friend at a time and this was witnessed. Sleep was associated with dizziness and left foot numbness. It was no witnessed seizure activity lead pressure was normal on EMS arrival is endorsing some headache. Patient's family states she takes her medication. For the past 2 weeks she has been have had body aches chills and cough  family is concerned that she may have had flu. Family she and her husband has been sick for past few days Blood sugar was checked and was normal in route the patient was slightly bradycardic. A Shim taken to emergency department as code stroke. On arrival patient was encephalopathic but NIHSS score was 0 and code stroke was canceled. Patient has been seen on the 19th by orthopedics for rheumatoid arthritis flare falling and hurting her shoulder Started on prednisone taper  Hospital Course:  Syncope follow by Acute Encephalopathy -Currently patient is alert and oriented x3 -?seizure vs bradycardia -EEG normal awake and drowsy EEG. -neurology consulted and appreciated -CT head showed stable and normal CT -MRI brain showed 2 punctate foci of acute ischemia within the left paracentral pons. No acute hemorrhage or mass effect -Doubt that MRI findings are cause of patient's AMS and syncope -Cardiology does not feel bradycardia is the cause of these syncopal events, however, has recommended wearing an event monitor for 30 days and follow up with EP in 6 weeks. -Spoke with Dr. Leonie Man, though EEG did not show seizure, and cardiology feels these events are not cardiac related, will try a low dose Depakote with outpatient follow up in 6 weeks. -Have explained to the patient that she will need to wear an event monitor for 30 days, this will monitor her HR and arrhythmias. She will need to follow up with Dr. Rayann Heman.  Also explained to patient, that although no seizure was noted on her EEG, and no other cause of her syncope has been found, a trial of anti-seizure medication was recommended by Neurology. (Of note, prior to my assessment today, patient was told by Neurology she would  be started on a new medication, 500mg  of something.  However, she then states no one is telling her anything.)  Bradycardia -Cardiology/EP consulted for possible need of pacemaker. Does not feel this is needed at this time.   Recommended event monitor for 30 days and follow up with EP in 6 weeks  -HR has been as low as 38.   -Currently no longer bradycardic -Hold metoprolol  Acute CVA -CT and MRI as above -Echocardiogram EF 40-98%, LV diastolic function normal. No RWMA -Carotid doppler 1-39% ICA stenosis. Vertebral artery flow is antegrade -Continue aspirin and statin -LDL 107 -hemoglobin A1c 6.5 -patient currently does not exhibit neuro deficits   Elevate DDimer -Patient with history of upper extremity DVT last year.  Was placed on Xarelto however there was an interaction with her RA med Orencia, and Xarelto discontinued -LE doppler negative for DVT -CTA chest showed no evidence of DBT  Hypothyroidism -TSH 14.791, FT4 0.74 -Continue synthroid  -Spoke with Dr. Buddy Duty (patient's endocrinologist). Med rec states synthroid 16mcg daily, she is supposed to be on 136mcg daily (recent increase in dose)  Chronic kidney disease, stage III -Creatine mildly elevated upon admission, 2 (however, GFR in stage III) -Currently creatinine 1.31 (baseline 1.2-1.4) -Continue to monitor BMP  Essential hypertension -given MRI findings, will allow for permissive HTN -patient also with bradycardia, metorpolol held  Chronic pain -narcotics held due to AMS  Diabetes mellitus, type II -Home insulin (novolin 70/30) currently held- continue at discharge -Hemoglobin A1c 6.5  Chronic anemia -hemoglobin currently 9.1, appears to be at baseline -Continue iron supplementation  Hepatic hypodenisity -Noted on CTA chest, 1.8cm, gradually increased from 2016 -LFTs WNL  Consultants Neurology Cardiology/EP  Procedures  Echocardiogram Carotid doppler LE doppler  EEG  Discharge Exam: Vitals:   02/28/16 2348 02/29/16 0421  BP: 119/61 109/64  Pulse:    Resp: 16 15  Temp: 98 F (36.7 C) 98.7 F (37.1 C)   Patient states she is feeling better today.  Still upset that a cause for her "blackouts" has not been  found. Has no complaints.  Denies current chest pain, shortness of breath, abdominal pain, N/V/D/C.   Exam  General: Well developed, well nourished, NAD, appears stated age  HEENT: NCAT, mucous membranes moist.   Neck: Supple, no JVD, no masses  Cardiovascular: S1 S2 auscultated, no rubs, murmurs or gallops. RRR  Respiratory: Clear to auscultation bilaterally with equal chest rise  Abdomen: Soft, nontender, nondistended, + bowel sounds  Extremities: warm dry without cyanosis clubbing or edema  Neuro: AAOx3, nonfocal  Psych: Normal affect and demeanor  Discharge Instructions Discharge Instructions    Ambulatory referral to Neurology    Complete by:  As directed    seizure patient. Dr. Leonie Man prefers follow up in 6 weeks   Discharge instructions    Complete by:  As directed    Patient will be discharged to home with home health physical therapy.   Patient will need to follow up with primary care provider within one week of discharge.   You will need to pick up an event monitor, cardiology will contact you.  Follow up with Dr. Rayann Heman, cardiologist, in 6 weeks.    Follow up with Dr. Leonie Man, neurologist in 6 weeks, the office will call you with an appointment. Patient should continue medications as prescribed.   Patient should follow a heart healthy/carb modified diet.  Do not drive.     Current Discharge Medication List    START taking these medications  Details  divalproex (DEPAKOTE ER) 500 MG 24 hr tablet Take 1 tablet (500 mg total) by mouth daily. Qty: 30 tablet, Refills: 0      CONTINUE these medications which have CHANGED   Details  levothyroxine (SYNTHROID, LEVOTHROID) 150 MCG tablet Take 1 tablet (150 mcg total) by mouth daily before breakfast.      CONTINUE these medications which have NOT CHANGED   Details  Abatacept (ORENCIA IV) Inject into the vein. Daughters are not sure of doses. Received in the short stay area once a month    acetaminophen (TYLENOL)  500 MG tablet Take 1,000 mg by mouth every 6 (six) hours as needed for mild pain.    allopurinol (ZYLOPRIM) 300 MG tablet Take 1 tablet (300 mg total) by mouth daily. Qty: 30 tablet, Refills: 5    aspirin EC 81 MG EC tablet Take 1 tablet (81 mg total) by mouth daily. Qty: 30 tablet, Refills: 0    cyclobenzaprine (FLEXERIL) 10 MG tablet Take 10 mg by mouth at bedtime.     diclofenac sodium (VOLTAREN) 1 % GEL 3 g to 3 larges joints up to three times daily Qty: 1 Tube, Refills: 3    ferrous sulfate (FEOSOL) 325 (65 FE) MG tablet Take 325-650 mg by mouth daily with breakfast.    folic acid (FOLVITE) 1 MG tablet Take 1 mg by mouth daily.    insulin NPH-regular Human (NOVOLIN 70/30) (70-30) 100 UNIT/ML injection Inject 6-8 Units into the skin 3 (three) times daily with meals. Sliding source    losartan (COZAAR) 50 MG tablet Take 50 mg by mouth every evening.    pregabalin (LYRICA) 75 MG capsule 1 pill in AM  &  2 at night Qty: 84 capsule, Refills: 0    VITAMIN E PO Take 1 tablet by mouth daily.    atorvastatin (LIPITOR) 40 MG tablet Take 40 mg by mouth daily at 6 PM.     BD INSULIN SYRINGE ULTRAFINE 31G X 15/64" 0.3 ML MISC     cholecalciferol (VITAMIN D) 1000 UNITS tablet Take 1,000 Units by mouth daily.     multivitamin (THERAGRAN) per tablet Take 1 tablet by mouth daily.       STOP taking these medications     metoprolol tartrate (LOPRESSOR) 25 MG tablet      HYDROcodone-acetaminophen (NORCO/VICODIN) 5-325 MG per tablet      predniSONE (DELTASONE) 5 MG tablet      predniSONE (DELTASONE) 5 MG tablet        Allergies  Allergen Reactions  . Methotrexate Derivatives Other (See Comments)    Elevated creat. Levels  . Other Other (See Comments)    NO Blood products.  [name]- causes low blood sugar (pt states she was taking medication but cant remember what it was for.)  . Penicillins Rash    Has patient had a PCN reaction causing immediate rash, facial/tongue/throat  swelling, SOB or lightheadedness with hypotension: Yes Has patient had a PCN reaction causing severe rash involving mucus membranes or skin necrosis: Yes  Has patient had a PCN reaction that required hospitalization No Has patient had a PCN reaction occurring within the last 10 years: No If all of the above answers are "NO", then may proceed with Cephalosporin use.   . Enbrel [Etanercept] Other (See Comments)    Inadequate response    . Remicade [Infliximab] Other (See Comments)    Inadequate response   . Zocor [Simvastatin] Other (See Comments)    Leg cramps  Follow-up Information    SETHI,PRAMOD, MD Follow up in 6 week(s).   Specialties:  Neurology, Radiology Why:  for seizures. office will call with appt date and time Contact information: [address] [place] [address] [phone]        Thompson Grayer, MD. Schedule an appointment as soon as possible for a visit in 6 week(s).   Specialty:  Cardiology Why:  Hospital follow up.  You will need an event monitor for 30 days. Office will call you. Contact information: [address] [place] [address] [phone]        Lilian Coma, MD. Schedule an appointment as soon as possible for a visit in 1 week(s).   Specialty:  Family Medicine Why:  Hospital follow up Contact information: [place] [address] [phone]            The results of significant diagnostics from this hospitalization (including imaging, microbiology, ancillary and laboratory) are listed below for reference.    Significant Diagnostic Studies: Dg Chest 2 View  Result Date: 02/27/2016 CLINICAL DATA:  76 year old female with transient ischemic attack. EXAM: CHEST  2 VIEW COMPARISON:  Chest radiograph dated 02/04/2016 FINDINGS: Minimal left lung base atelectatic changes. No focal consolidation, pleural effusion, or pneumothorax. The cardiac silhouette is within normal  limits. No acute osseous pathology. IMPRESSION: No active cardiopulmonary disease. Electronically Signed   By: Anner Crete M.D.   On: 02/27/2016 23:33   Dg Ribs Unilateral W/chest Left  Result Date: 02/04/2016 CLINICAL DATA:  [place] 1 week ago with left anterior rib pain. EXAM: LEFT RIBS AND CHEST - 3+ VIEW COMPARISON:  10/18/2015 FINDINGS: Cardiomediastinal silhouette is normal. The right chest is clear. There is mild atelectasis at the left lung base. There is no visible rib fracture. Cartilage injury not excluded. IMPRESSION: Mild atelectasis at the left lung base. No rib fracture seen. Cartilage injury not excluded. Electronically Signed   By: Nelson Chimes M.D.   On: 02/04/2016 15:49   Ct Angio Chest Pe W Or Wo Contrast  Result Date: 02/28/2016 CLINICAL DATA:  Acute onset of elevated D-dimer.  Initial encounter. EXAM: CT ANGIOGRAPHY CHEST WITH CONTRAST TECHNIQUE: Multidetector CT imaging of the chest was performed using the standard protocol during bolus administration of intravenous contrast. Multiplanar CT image reconstructions and MIPs were obtained to evaluate the vascular anatomy. CONTRAST:  80 mL of Isovue 370 IV contrast COMPARISON:  CT of the chest performed 10/18/2015, and chest radiograph performed 02/27/2016 FINDINGS: Cardiovascular:  There is no evidence of pulmonary embolus. The heart is mildly enlarged. Scattered calcification is noted along the aortic valve and ascending thoracic aorta. The great vessels are grossly unremarkable in appearance, though not well assessed given the phase of contrast enhancement. Mediastinum/Nodes: Visualized mediastinal nodes remain normal in size. No pericardial effusion is identified. The patient is status post thyroidectomy. No axillary lymphadenopathy is seen. Lungs/Pleura: Patchy bilateral airspace opacities may reflect atelectasis or possibly mild infection. No pleural effusion or pneumothorax is seen. No masses are identified. The previously noted  tiny subpleural nodule at the left upper lobe is no longer seen on this study. Upper Abdomen: A 1.8 cm hypodensity is noted at the hepatic dome. The liver and spleen are otherwise unremarkable. The gallbladder, visualized portions of the pancreas, and adrenal glands are unremarkable. Nonspecific perinephric stranding is noted bilaterally. A cyst is seen at the pole of the right kidney. Musculoskeletal: No acute osseous abnormalities are identified. Anterior  bridging osteophytes are noted along the thoracic spine. The visualized musculature is unremarkable in appearance. Review of the MIP images confirms the above findings. IMPRESSION: 1. No evidence of pulmonary embolus. 2. Patchy bilateral airspace opacities may reflect atelectasis or possibly mild infection. 3. Mild cardiomegaly. Scattered calcification along the aortic valve and ascending thoracic aorta. 4. Nonspecific 1.8 cm hypodensity at the hepatic dome has gradually increased in size from 2016. Would correlate with LFTs. 5. Right renal cyst noted. Electronically Signed   By: Garald Balding M.D.   On: 02/28/2016 19:00   Mr Brain Wo Contrast  Result Date: 02/28/2016 CLINICAL DATA:  Sudden onset lethargy. Dizziness and left foot numbness. EXAM: MRI HEAD WITHOUT CONTRAST MRA HEAD WITHOUT CONTRAST TECHNIQUE: Multiplanar, multiecho pulse sequences of the brain and surrounding structures were obtained without intravenous contrast. Angiographic images of the head were obtained using MRA technique without contrast. COMPARISON:  Head CT 02/27/2016 Brain MRI 07/27/2014 FINDINGS: MRI HEAD FINDINGS Brain: There are 2 punctate foci of diffusion restriction within the left paracentral pons (series 3, image 15). No other diffusion restriction. No intracranial hemorrhage. There is multifocal hyperintense T2-weighted signal within the periventricular white matter, most often seen in the setting of chronic microvascular ischemia. No mass lesion or midline shift. No  hydrocephalus or extra-axial fluid collection. The midline structures are normal. No age advanced or lobar predominant atrophy. Vascular: Major intracranial arterial and venous sinus flow voids are preserved. Chronic micro hemorrhages in the anterior right temporal lobe are unchanged. Skull and upper cervical spine: Remote right frontoparietal craniotomy. Sinuses/Orbits: No fluid levels or advanced mucosal thickening. No mastoid effusion. Normal orbits. MRA HEAD FINDINGS Intracranial internal carotid arteries: Normal. Anterior cerebral arteries: Hypoplastic right A1 segment is a normal congenital variant. The anterior cerebral arteries are otherwise normal. Middle cerebral arteries: Normal. Posterior communicating arteries: Absent bilaterally. Posterior cerebral arteries: Normal. Basilar artery: Normal. Vertebral arteries: Codominant. Normal. Superior cerebellar arteries: Normal. Anterior inferior cerebellar arteries: Normal. Posterior inferior cerebellar arteries: Normal. IMPRESSION: 1. Two punctate foci of acute ischemia within the left paracentral pons. No acute hemorrhage or mass effect. 2. Normal intracranial MRA. Electronically Signed   By: Ulyses Jarred M.D.   On: 02/28/2016 01:01   Dg Humerus Left  Result Date: 02/04/2016 CLINICAL DATA:  [place] 1 week ago.  Upper arm pain. EXAM: LEFT HUMERUS - 2+ VIEW COMPARISON:  MRI 12/29/2015.  Radiography 05/30/2015. FINDINGS: No evidence of fracture or dislocation. 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 These include narrowed humeral acromial distance and laterally sloping acromion with sclerotic change. IMPRESSION: No acute finding. 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 Electronically Signed   By: Nelson Chimes M.D.   On: 02/04/2016 15:50   Mr Jodene Nam Head/brain UM Cm  Result Date: 02/28/2016 CLINICAL DATA:  Sudden onset lethargy. Dizziness and left foot numbness. EXAM: MRI HEAD WITHOUT CONTRAST MRA HEAD WITHOUT  CONTRAST TECHNIQUE: Multiplanar, multiecho pulse sequences of the brain and surrounding structures were obtained without intravenous contrast. Angiographic images of the head were obtained using MRA technique without contrast. COMPARISON:  Head CT 02/27/2016 Brain MRI 07/27/2014 FINDINGS: MRI HEAD FINDINGS Brain: There are 2 punctate foci of diffusion restriction within the left paracentral pons (series 3, image 15). No other diffusion restriction. No intracranial hemorrhage. There is multifocal hyperintense T2-weighted signal within the periventricular white matter, most often seen in the setting of chronic microvascular ischemia. No mass lesion or midline shift. No hydrocephalus or extra-axial fluid collection. The midline  structures are normal. No age advanced or lobar predominant atrophy. Vascular: Major intracranial arterial and venous sinus flow voids are preserved. Chronic micro hemorrhages in the anterior right temporal lobe are unchanged. Skull and upper cervical spine: Remote right frontoparietal craniotomy. Sinuses/Orbits: No fluid levels or advanced mucosal thickening. No mastoid effusion. Normal orbits. MRA HEAD FINDINGS Intracranial internal carotid arteries: Normal. Anterior cerebral arteries: Hypoplastic right A1 segment is a normal congenital variant. The anterior cerebral arteries are otherwise normal. Middle cerebral arteries: Normal. Posterior communicating arteries: Absent bilaterally. Posterior cerebral arteries: Normal. Basilar artery: Normal. Vertebral arteries: Codominant. Normal. Superior cerebellar arteries: Normal. Anterior inferior cerebellar arteries: Normal. Posterior inferior cerebellar arteries: Normal. IMPRESSION: 1. Two punctate foci of acute ischemia within the left paracentral pons. No acute hemorrhage or mass effect. 2. Normal intracranial MRA. Electronically Signed   By: Ulyses Jarred M.D.   On: 02/28/2016 01:01   Ct Head Code Stroke W/o Cm  Result Date: 02/27/2016 CLINICAL  DATA:  Code stroke. 76 year old female last seen normal at 1530 hours. Dizziness and left foot numbness. Initial encounter. Personal history of meningioma resection multiple years ago. EXAM: CT HEAD WITHOUT CONTRAST TECHNIQUE: Contiguous axial images were obtained from the base of the skull through the vertex without intravenous contrast. COMPARISON:  Brain MRI 07/27/2014.  Head CT 11/21/2009. FINDINGS: Brain: Cerebral volume remains normal for age. No midline shift, ventriculomegaly, mass effect, evidence of mass lesion, intracranial hemorrhage or evidence of cortically based acute infarction. Gray-white matter differentiation is within normal limits throughout the brain. No cortical encephalomalacia identified. Vascular: Calcified atherosclerosis at the skull base. No suspicious intracranial vascular hyperdensity. Dural calcifications. Skull: Stable previous right frontotemporal craniotomy. No acute osseous abnormality identified. Sinuses/Orbits: Visualized paranasal sinuses and mastoids are stable and well pneumatized; chronic bubbly opacity in the left sphenoid is mildly improved compared to 2011. Other: No acute orbit or scalp soft tissue findings. ASPECTS ([place] Stroke Program Early CT Score) - Ganglionic level infarction (caudate, lentiform nuclei, internal capsule, insula, M1-M3 cortex): 7 - Supraganglionic infarction (M4-M6 cortex): 3 Total score (0-10 with 10 being normal): 10 IMPRESSION: 1. Stable and normal for age noncontrast CT appearance of the brain other than remote right frontotemporal craniotomy. 2. ASPECTS is 10. 3. The above was relayed via text pager to Dr.Oster on 02/27/2016 at 16:57 . Electronically Signed   By: Genevie Ann M.D.   On: 02/27/2016 16:58    Microbiology: Recent Results (from the past 240 hour(s))  MRSA PCR Screening     Status: None   Collection Time: 02/28/16  1:43 AM  Result Value Ref Range Status   MRSA by PCR NEGATIVE NEGATIVE Final    Comment:        The GeneXpert  MRSA Assay (FDA approved for NASAL specimens only), is one component of a comprehensive MRSA colonization surveillance program. It is not intended to diagnose MRSA infection nor to guide or monitor treatment for MRSA infections.      Labs: Basic Metabolic Panel:  Recent Labs Lab 02/27/16 1721 02/27/16 1915 02/28/16 0434 02/29/16 0614  NA 142 140 140 139  K 4.5 4.5 4.2 4.7  CL 109 106 110 112*  CO2  --  23 22 19*  GLUCOSE 93 108* 91 144*  BUN 29* 21* 16 16  CREATININE 2.00* 1.73* 1.29* 1.31*  CALCIUM  --  9.9 9.3 8.7*   Liver Function Tests:  Recent Labs Lab 02/27/16 1915 02/28/16 0434  AST 16 15  ALT 13* 12*  ALKPHOS 56 50  BILITOT 0.7 0.9  PROT 7.3 6.8  ALBUMIN 4.0 3.4*   No results for input(s): LIPASE, AMYLASE in the last 168 hours.  Recent Labs Lab 02/27/16 2054  AMMONIA 9   CBC:  Recent Labs Lab 02/27/16 1721 02/27/16 1915 02/28/16 0434 02/29/16 0614  WBC  --  7.7 6.4 6.3  NEUTROABS  --  4.5 2.8  --   HGB 11.6* 10.8* 10.8* 9.1*  HCT 34.0* 32.9* 32.6* 27.8*  MCV  --  86.6 86.2 86.6  PLT  --  268 206 222   Cardiac Enzymes:  Recent Labs Lab 02/28/16 0434 02/28/16 0953  TROPONINI <0.03 <0.03   BNP: BNP (last 3 results)  Recent Labs  10/18/15 2337  BNP 46.4    ProBNP (last 3 results) No results for input(s): PROBNP in the last 8760 hours.  CBG:  Recent Labs Lab 02/28/16 1634 02/28/16 2059 02/29/16 0037 02/29/16 0423 02/29/16 0744  GLUCAP 93 123* 125* 133* 99       Signed:  Malaak Stach  [facility] 02/29/2016, 10:14 AM

## 2016-02-29 NOTE — Care Management Note (Signed)
Case Management Note  Patient Details  Name: KORINE WINTON MRN: [id number] Date of Birth: January 22, 1940  Subjective/Objective:   CM received referral stating pt could not afford meds - discussed with pt and daughter - pt has insurance but states copay for Lyrica is too high.  Provided daughter with information for patient assistance through [employer].  CM also provided information on qualifying for Medicaid and completing application.  PT recommended home therapy - provided pt with list of home health agencies and referral made to [facility] per choice.                   Expected Discharge Plan:  [place]  Discharge planning Services  CM Consult  Post Acute Care Choice:  Home Health Choice offered to:  Patient  HH Arranged:  PT [place]:  [place]  Status of Service:  Completed, signed off  Girard Cooter, [place] 02/29/2016, 10:28 AM

## 2016-02-29 NOTE — Progress Notes (Signed)
STROKE TEAM PROGRESS NOTE   SUBJECTIVE (INTERVAL HISTORY) Her daughter is at the bedside.  She feels her mother is back at the bedside. No changes since am. they recounted episodes yesterday and prior - all include passing out and inability to recognize surroundings. Strokes present on MRI do not correlate with presenting symptoms. Symptoms more consistent with cardiac event. Will also check EEG given prior brain surgery. She has history of frontal temporal brain surgery for meningioma removal by Dr. Vertell Limber but no documented history of seizures and has not been on any seizure medications   OBJECTIVE Temp:  [97 F (36.1 C)-98.9 F (37.2 C)] 98.7 F (37.1 C) (02/23 0421) Pulse Rate:  [38-107] 104 (02/22 1854) Cardiac Rhythm: Sinus bradycardia (02/23 0700) Resp:  [14-18] 15 (02/23 0421) BP: (109-141)/(59-88) 109/64 (02/23 0421) SpO2:  [95 %-100 %] 100 % (02/23 0421)  CBC:   Recent Labs Lab 02/27/16 1915 02/28/16 0434 02/29/16 0614  WBC 7.7 6.4 6.Julia  NEUTROABS 4.5 2.8  --   HGB 10.8* 10.8* 9.1*  HCT 32.9* 32.6* 27.8*  MCV 86.6 86.2 86.6  PLT 268 206 382    Basic Metabolic Panel:   Recent Labs Lab 02/28/16 0434 02/29/16 0614  NA 140 139  K 4.2 4.7  CL 110 112*  CO2 22 19*  GLUCOSE 91 144*  BUN 16 16  CREATININE 1.29* 1.31*  CALCIUM 9.Julia 8.7*   HgbA1c:  Lab Results  Component Value Date   HGBA1C 6.5 (H) 02/28/2016     PHYSICAL EXAM Pleasant elderly African-American [name] not in distress. . Afebrile. Head is nontraumatic. Neck is supple without bruit.    Cardiac exam no murmur or gallop. Lungs are clear to auscultation. Distal pulses are well felt. Neurological Exam ;  Awake  Alert oriented x Julia. Normal speech and language.Diminished attention and recall.eye movements full without nystagmus.fundi were not visualized. Vision acuity and fields appear normal. Hearing is normal. Palatal movements are normal. Face symmetric. Tongue midline. Normal strength, tone, reflexes  and coordination except left shoulder elevation limited due to rotator cuff injury. Normal sensation. Gait deferred.  ASSESSMENT/PLAN [name] is a 76 y.o. female with history of Arthritis, mild aortic stenosis, HTN, DM, CAD, gout, chronic pain, history of DVT, hypothyroidism, PN, chronic diastolic CHF, and recurrent syncopal episodes presenting with a syncopal episode with loss of consciousness. She did not receive IV t-PA due to non-stroke presentation.   Stroke:  Incidental punctate L paracentral pontine infarcts 2 in setting of bradycardia and syncopal episode. Rule out seizure.  Code stroke CT normal except old R frontotemporal crani. Aspects 10  MRI  2 punctate L paracentral pontine infarcts  MRA  Normal  No further brain imaging needed  Carotid Doppler  1-39% ICA stenosis. Vertebral artery flow is antegrade.  Lower extremity Doppler No evidence of deep vein or superficial thrombosis involving the  right lower extremity and left lower extremity 2D Echo  Left ventricle: The cavity size was normal. Systolic function was   normal. The estimated ejection fraction was in the range of 60%   to 65%. Wall motion was normal; there were no regional wall    motion abnormalities    EEG normal   LDL 107  HgbA1c6.5  SCDs for VTE prophylaxis Diet Heart Room service appropriate? Yes; Fluid consistency: Thin  aspirin 81 mg daily prior to admission, now on aspirin 325 mg daily  Therapy recommendations:  Pending  Disposition:  Pending  Hypertension  Stable   Hyperlipidemia  Home meds:  Lipitor 40, resumed in hospital  LDL above goal for stroke  Continue statin at discharge  Diabetes  HgbA1c goal < 7.0  Other Stroke Risk Factors  Advanced age  Former Cigarette smoker  UDS positive opiates  Obesity, Body mass index is 31.67 kg/m.  Family hx stroke (mother)  Other Active Problems  Encephalopathy, post syncope   Syncope with bradycardia. May  need pacer. Dr. Leonie Man asked Dr. Ron Parker to consult  Rheumatoid arthritis  Gout  Hypothyroidism  Hospital day # [place] for Pager information 02/29/2016 10:21 AM  I have personally examined this patient, reviewed notes, independently viewed imaging studies, participated in medical decision making and plan of care.ROS completed by me personally and pertinent positives fully documented  I have made any additions or clarifications directly to the above note. Agree with note above. [name] with recurrent stereotypical episodes of dizziness, perspiration and altered consciousness followed by lethargy and tiredness and confusion in the setting of known chronic bradycardia possibilities include stroke Adams episodes versus complex partial seizures given prior history of meningioma surgery. Clearly her areas of tiny ill-defined pontine diffusion abnormality is unrelated to her clinical presentation and likely represents changes of chronic microvascular ischemia with some acute component secondary to hypertension, syncopal event . Cardiology feels patient has chronic bradycardia and episodes cannot be explained by them hence I think it is reasonable to give her empirical trial of anticonvulsants even though the EEG was normal Discussed with Dr. Ree Kida  Greater than 50% time during this 25 minute visit was spent on counseling and coordination of care about her episodes of altered consciousness, confusion, seizures, bradycardia and syncope and answering questions. Patient may be discharged home. Follow-up as an outpatient in the clinic in 6 weeks. Kindly call for questions.  Antony Contras, MD Medical Director [facility] Pager: [phone] 02/29/2016 10:21 AM  To contact Stroke Continuity provider, please refer to [email or web address] After hours, contact General Neurology

## 2016-02-29 NOTE — Discharge Instructions (Signed)
Syncope Introduction Syncope is when you lose temporarily pass out (faint). Signs that you may be about to pass out include:  Feeling dizzy or light-headed.  Feeling sick to your stomach (nauseous).  Seeing all white or all black.  Having cold, clammy skin. If you passed out, get help right away. Call your local emergency services (911 in the [place] Do not drive yourself to the hospital. Follow these instructions at home: Pay attention to any changes in your symptoms. Take these actions to help with your condition:  Have someone stay with you until you feel stable.  Do not drive, use machinery, or play sports until your doctor says it is okay.  Keep all follow-up visits as told by your doctor. This is important.  If you start to feel like you might pass out, lie down right away and raise (elevate) your feet above the level of your heart. Breathe deeply and steadily. Wait until all of the symptoms are gone.  Drink enough fluid to keep your pee (urine) clear or pale yellow.  If you are taking blood pressure or heart medicine, get up slowly and spend many minutes getting ready to sit and then stand. This can help with dizziness.  Take over-the-counter and prescription medicines only as told by your doctor. Get help right away if:  You have a very bad headache.  You have unusual pain in your chest, tummy, or back.  You are bleeding from your mouth or rectum.  You have black or tarry poop (stool).  You have a very fast or uneven heartbeat (palpitations).  It hurts to breathe.  You pass out once or more than once.  You have jerky movements that you cannot control (seizure).  You are confused.  You have trouble walking.  You are very weak.  You have vision problems. These symptoms may be an emergency. Do not wait to see if the symptoms will go away. Get medical help right away. Call your local emergency services (911 in the [place] Do not drive yourself to the hospital.    This information is not intended to replace advice given to you by your health care provider. Make sure you discuss any questions you have with your health care provider. Document Released: 06/11/2007 Document Revised: 05/31/2015 Document Reviewed: 09/06/2014  2017 Elsevier   Stroke Prevention Some health problems and behaviors may make it more likely for you to have a stroke. Below are ways to lessen your risk of having a stroke.  Be active for at least 30 minutes on most or all days.  Do not smoke. Try not to be around others who smoke.  Do not drink too much alcohol.  Do not have more than 2 drinks a day if you are a man.  Do not have more than 1 drink a day if you are a woman and are not pregnant.  Eat healthy foods, such as fruits and vegetables. If you were put on a specific diet, follow the diet as told.  Keep your cholesterol levels under control through diet and medicines. Look for foods that are low in saturated fat, trans fat, cholesterol, and are high in fiber.  If you have diabetes, follow all diet plans and take your medicine as told.  Ask your doctor if you need treatment to lower your blood pressure. If you have high blood pressure (hypertension), follow all diet plans and take your medicine as told by your doctor.  If you are 78-62 years old, have your  blood pressure checked every 3-5 years. If you are age 34 or older, have your blood pressure checked every year.  Keep a healthy weight. Eat foods that are low in calories, salt, saturated fat, trans fat, and cholesterol.  Do not take drugs.  Avoid birth control pills, if this applies. Talk to your doctor about the risks of taking birth control pills.  Talk to your doctor if you have sleep problems (sleep apnea).  Take all medicine as told by your doctor.  You may be told to take aspirin or blood thinner medicine. Take this medicine as told by your doctor.  Understand your medicine instructions.  Make sure  any other conditions you have are being taken care of. Get help right away if:  You suddenly lose feeling (you feel numb) or have weakness in your face, arm, or leg.  Your face or eyelid hangs down to one side.  You suddenly feel confused.  You have trouble talking (aphasia) or understanding what people are saying.  You suddenly have trouble seeing in one or both eyes.  You suddenly have trouble walking.  You are dizzy.  You lose your balance or your movements are clumsy (uncoordinated).  You suddenly have a very bad headache and you do not know the cause.  You have new chest pain.  Your heart feels like it is fluttering or skipping a beat (irregular heartbeat). Do not wait to see if the symptoms above go away. Get help right away. Call your local emergency services (911 in [place] Do not drive yourself to the hospital.  This information is not intended to replace advice given to you by your health care provider. Make sure you discuss any questions you have with your health care provider. Document Released: 06/24/2011 Document Revised: 05/31/2015 Document Reviewed: 06/25/2012 Elsevier Interactive Patient Education  2017 [employer].

## 2016-03-01 DIAGNOSIS — M069 Rheumatoid arthritis, unspecified: Secondary | ICD-10-CM | POA: Diagnosis not present

## 2016-03-01 DIAGNOSIS — D573 Sickle-cell trait: Secondary | ICD-10-CM | POA: Diagnosis not present

## 2016-03-01 DIAGNOSIS — I130 Hypertensive heart and chronic kidney disease with heart failure and stage 1 through stage 4 chronic kidney disease, or unspecified chronic kidney disease: Secondary | ICD-10-CM | POA: Diagnosis not present

## 2016-03-01 DIAGNOSIS — D649 Anemia, unspecified: Secondary | ICD-10-CM | POA: Diagnosis not present

## 2016-03-01 DIAGNOSIS — N183 Chronic kidney disease, stage 3 (moderate): Secondary | ICD-10-CM | POA: Diagnosis not present

## 2016-03-01 DIAGNOSIS — E1122 Type 2 diabetes mellitus with diabetic chronic kidney disease: Secondary | ICD-10-CM | POA: Diagnosis not present

## 2016-03-01 DIAGNOSIS — Z87891 Personal history of nicotine dependence: Secondary | ICD-10-CM | POA: Diagnosis not present

## 2016-03-01 DIAGNOSIS — Z8673 Personal history of transient ischemic attack (TIA), and cerebral infarction without residual deficits: Secondary | ICD-10-CM | POA: Diagnosis not present

## 2016-03-01 DIAGNOSIS — G8929 Other chronic pain: Secondary | ICD-10-CM | POA: Diagnosis not present

## 2016-03-01 DIAGNOSIS — Z96652 Presence of left artificial knee joint: Secondary | ICD-10-CM | POA: Diagnosis not present

## 2016-03-01 DIAGNOSIS — E1142 Type 2 diabetes mellitus with diabetic polyneuropathy: Secondary | ICD-10-CM | POA: Diagnosis not present

## 2016-03-01 DIAGNOSIS — Z9089 Acquired absence of other organs: Secondary | ICD-10-CM | POA: Diagnosis not present

## 2016-03-01 DIAGNOSIS — I509 Heart failure, unspecified: Secondary | ICD-10-CM | POA: Diagnosis not present

## 2016-03-01 DIAGNOSIS — M1990 Unspecified osteoarthritis, unspecified site: Secondary | ICD-10-CM | POA: Diagnosis not present

## 2016-03-03 ENCOUNTER — Ambulatory Visit: Payer: Medicare Other | Admitting: Rheumatology

## 2016-03-03 DIAGNOSIS — E039 Hypothyroidism, unspecified: Secondary | ICD-10-CM | POA: Diagnosis not present

## 2016-03-04 ENCOUNTER — Other Ambulatory Visit: Payer: Self-pay | Admitting: Radiology

## 2016-03-04 DIAGNOSIS — I509 Heart failure, unspecified: Secondary | ICD-10-CM | POA: Diagnosis not present

## 2016-03-04 DIAGNOSIS — E1142 Type 2 diabetes mellitus with diabetic polyneuropathy: Secondary | ICD-10-CM | POA: Diagnosis not present

## 2016-03-04 DIAGNOSIS — N183 Chronic kidney disease, stage 3 (moderate): Secondary | ICD-10-CM | POA: Diagnosis not present

## 2016-03-04 DIAGNOSIS — M0579 Rheumatoid arthritis with rheumatoid factor of multiple sites without organ or systems involvement: Secondary | ICD-10-CM

## 2016-03-04 DIAGNOSIS — Z8673 Personal history of transient ischemic attack (TIA), and cerebral infarction without residual deficits: Secondary | ICD-10-CM | POA: Diagnosis not present

## 2016-03-04 DIAGNOSIS — E1122 Type 2 diabetes mellitus with diabetic chronic kidney disease: Secondary | ICD-10-CM | POA: Diagnosis not present

## 2016-03-04 DIAGNOSIS — I130 Hypertensive heart and chronic kidney disease with heart failure and stage 1 through stage 4 chronic kidney disease, or unspecified chronic kidney disease: Secondary | ICD-10-CM | POA: Diagnosis not present

## 2016-03-04 NOTE — Progress Notes (Signed)
Orders placed CBC CMP every 2 months with orencia infusions and tylenol benadryl 60 minutes preop

## 2016-03-05 DIAGNOSIS — I130 Hypertensive heart and chronic kidney disease with heart failure and stage 1 through stage 4 chronic kidney disease, or unspecified chronic kidney disease: Secondary | ICD-10-CM | POA: Diagnosis not present

## 2016-03-05 DIAGNOSIS — E1142 Type 2 diabetes mellitus with diabetic polyneuropathy: Secondary | ICD-10-CM | POA: Diagnosis not present

## 2016-03-05 DIAGNOSIS — N183 Chronic kidney disease, stage 3 (moderate): Secondary | ICD-10-CM | POA: Diagnosis not present

## 2016-03-05 DIAGNOSIS — I509 Heart failure, unspecified: Secondary | ICD-10-CM | POA: Diagnosis not present

## 2016-03-05 DIAGNOSIS — Z8673 Personal history of transient ischemic attack (TIA), and cerebral infarction without residual deficits: Secondary | ICD-10-CM | POA: Diagnosis not present

## 2016-03-05 DIAGNOSIS — E1122 Type 2 diabetes mellitus with diabetic chronic kidney disease: Secondary | ICD-10-CM | POA: Diagnosis not present

## 2016-03-07 ENCOUNTER — Other Ambulatory Visit (HOSPITAL_COMMUNITY): Payer: Self-pay | Admitting: *Deleted

## 2016-03-07 DIAGNOSIS — I639 Cerebral infarction, unspecified: Secondary | ICD-10-CM | POA: Diagnosis not present

## 2016-03-07 DIAGNOSIS — R001 Bradycardia, unspecified: Secondary | ICD-10-CM | POA: Diagnosis not present

## 2016-03-07 DIAGNOSIS — G9340 Encephalopathy, unspecified: Secondary | ICD-10-CM | POA: Diagnosis not present

## 2016-03-10 ENCOUNTER — Ambulatory Visit (HOSPITAL_COMMUNITY)
Admission: RE | Admit: 2016-03-10 | Discharge: 2016-03-10 | Disposition: A | Discharge status: Home / Self Care | Payer: Medicare Other | Source: Ambulatory Visit | Attending: Rheumatology | Admitting: Rheumatology

## 2016-03-10 DIAGNOSIS — M0579 Rheumatoid arthritis with rheumatoid factor of multiple sites without organ or systems involvement: Secondary | ICD-10-CM | POA: Diagnosis not present

## 2016-03-10 MED ORDER — DIPHENHYDRAMINE HCL 25 MG PO CAPS: 25.0000 mg | ORAL_CAPSULE | ORAL | Status: DC

## 2016-03-10 MED ORDER — SODIUM CHLORIDE 0.9 % IV SOLN
750.0000 mg | INTRAVENOUS | Status: DC
  Administered 2016-03-10: 12:00:00 750 mg via INTRAVENOUS
  Filled 2016-03-10: qty 30

## 2016-03-10 MED ORDER — ACETAMINOPHEN 325 MG PO TABS: 650.0000 mg | ORAL_TABLET | ORAL | Status: DC

## 2016-03-11 ENCOUNTER — Other Ambulatory Visit: Payer: Self-pay | Admitting: Physician Assistant

## 2016-03-11 ENCOUNTER — Ambulatory Visit (INDEPENDENT_AMBULATORY_CARE_PROVIDER_SITE_OTHER): Payer: Medicare Other

## 2016-03-11 DIAGNOSIS — I493 Ventricular premature depolarization: Secondary | ICD-10-CM

## 2016-03-11 DIAGNOSIS — I639 Cerebral infarction, unspecified: Secondary | ICD-10-CM

## 2016-03-11 DIAGNOSIS — R55 Syncope and collapse: Secondary | ICD-10-CM

## 2016-03-11 DIAGNOSIS — R001 Bradycardia, unspecified: Secondary | ICD-10-CM

## 2016-03-12 DIAGNOSIS — E1122 Type 2 diabetes mellitus with diabetic chronic kidney disease: Secondary | ICD-10-CM | POA: Diagnosis not present

## 2016-03-12 DIAGNOSIS — I509 Heart failure, unspecified: Secondary | ICD-10-CM | POA: Diagnosis not present

## 2016-03-12 DIAGNOSIS — Z8673 Personal history of transient ischemic attack (TIA), and cerebral infarction without residual deficits: Secondary | ICD-10-CM | POA: Diagnosis not present

## 2016-03-12 DIAGNOSIS — N183 Chronic kidney disease, stage 3 (moderate): Secondary | ICD-10-CM | POA: Diagnosis not present

## 2016-03-12 DIAGNOSIS — I130 Hypertensive heart and chronic kidney disease with heart failure and stage 1 through stage 4 chronic kidney disease, or unspecified chronic kidney disease: Secondary | ICD-10-CM | POA: Diagnosis not present

## 2016-03-12 DIAGNOSIS — E1142 Type 2 diabetes mellitus with diabetic polyneuropathy: Secondary | ICD-10-CM | POA: Diagnosis not present

## 2016-03-14 DIAGNOSIS — Z8673 Personal history of transient ischemic attack (TIA), and cerebral infarction without residual deficits: Secondary | ICD-10-CM | POA: Diagnosis not present

## 2016-03-14 DIAGNOSIS — I130 Hypertensive heart and chronic kidney disease with heart failure and stage 1 through stage 4 chronic kidney disease, or unspecified chronic kidney disease: Secondary | ICD-10-CM | POA: Diagnosis not present

## 2016-03-14 DIAGNOSIS — E1122 Type 2 diabetes mellitus with diabetic chronic kidney disease: Secondary | ICD-10-CM | POA: Diagnosis not present

## 2016-03-14 DIAGNOSIS — N183 Chronic kidney disease, stage 3 (moderate): Secondary | ICD-10-CM | POA: Diagnosis not present

## 2016-03-14 DIAGNOSIS — I509 Heart failure, unspecified: Secondary | ICD-10-CM | POA: Diagnosis not present

## 2016-03-14 DIAGNOSIS — E1142 Type 2 diabetes mellitus with diabetic polyneuropathy: Secondary | ICD-10-CM | POA: Diagnosis not present

## 2016-03-18 ENCOUNTER — Ambulatory Visit: Payer: Medicare Other

## 2016-03-18 ENCOUNTER — Ambulatory Visit: Payer: Medicare Other | Admitting: Sports Medicine

## 2016-03-20 DIAGNOSIS — I509 Heart failure, unspecified: Secondary | ICD-10-CM | POA: Diagnosis not present

## 2016-03-20 DIAGNOSIS — E1122 Type 2 diabetes mellitus with diabetic chronic kidney disease: Secondary | ICD-10-CM | POA: Diagnosis not present

## 2016-03-20 DIAGNOSIS — I130 Hypertensive heart and chronic kidney disease with heart failure and stage 1 through stage 4 chronic kidney disease, or unspecified chronic kidney disease: Secondary | ICD-10-CM | POA: Diagnosis not present

## 2016-03-20 DIAGNOSIS — E1142 Type 2 diabetes mellitus with diabetic polyneuropathy: Secondary | ICD-10-CM | POA: Diagnosis not present

## 2016-03-20 DIAGNOSIS — N183 Chronic kidney disease, stage 3 (moderate): Secondary | ICD-10-CM | POA: Diagnosis not present

## 2016-03-20 DIAGNOSIS — Z8673 Personal history of transient ischemic attack (TIA), and cerebral infarction without residual deficits: Secondary | ICD-10-CM | POA: Diagnosis not present

## 2016-03-24 DIAGNOSIS — S46012D Strain of muscle(s) and tendon(s) of the rotator cuff of left shoulder, subsequent encounter: Secondary | ICD-10-CM | POA: Diagnosis not present

## 2016-03-25 ENCOUNTER — Ambulatory Visit (INDEPENDENT_AMBULATORY_CARE_PROVIDER_SITE_OTHER): Payer: Medicare Other

## 2016-03-25 ENCOUNTER — Ambulatory Visit (INDEPENDENT_AMBULATORY_CARE_PROVIDER_SITE_OTHER): Payer: Medicare Other | Admitting: Sports Medicine

## 2016-03-25 ENCOUNTER — Ambulatory Visit: Payer: Medicare Other

## 2016-03-25 DIAGNOSIS — S82891A Other fracture of right lower leg, initial encounter for closed fracture: Secondary | ICD-10-CM | POA: Diagnosis not present

## 2016-03-25 DIAGNOSIS — M2040 Other hammer toe(s) (acquired), unspecified foot: Secondary | ICD-10-CM

## 2016-03-25 DIAGNOSIS — L84 Corns and callosities: Secondary | ICD-10-CM

## 2016-03-25 DIAGNOSIS — B351 Tinea unguium: Secondary | ICD-10-CM

## 2016-03-25 DIAGNOSIS — M25571 Pain in right ankle and joints of right foot: Secondary | ICD-10-CM

## 2016-03-25 DIAGNOSIS — E1140 Type 2 diabetes mellitus with diabetic neuropathy, unspecified: Secondary | ICD-10-CM

## 2016-03-25 DIAGNOSIS — Z794 Long term (current) use of insulin: Secondary | ICD-10-CM

## 2016-03-25 NOTE — Progress Notes (Signed)
Patient ID: [name], female   DOB: 1940-02-23, 76 y.o.   MRN: [id number]  Subjective: [name] is a 76 y.o. female patient with history of type 2 diabetes who returns to office today complaining of long, painful nails and callus while ambulating in shoes; unable to trim. Patient states that the glucose reading this morning was not recorded but good. Patient admits new problems with left shoulder and new pain at right ankle worse last 1.5 weeks. Patient denies any new cramping, numbness, burning or tingling in the legs. Patient is here for diabetic shoes measurements.   Patient Active Problem List   Diagnosis Date Noted  . 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 Elevated d-dimer   . Encephalopathy   . Sinus bradycardia   . Acute encephalopathy 02/27/2016  . Seropositive rheumatoid arthritis of multiple sites ([place]) 01/17/2016  . High risk medications (not anticoagulants) long-term use 01/17/2016  . Bilateral hand pain 01/17/2016  . Bilateral wrist pain 01/17/2016  . Pain in right elbow 01/17/2016  . Left shoulder pain 01/17/2016  . Encounter for therapeutic drug monitoring 11/02/2015  . Bronchiectasis without complication ([place]) 49/44/9675  . Chronic gout involving toe without tophus 10/30/2015  . High risk medication use 10/29/2015  . Gout 10/29/2015  . H/O total knee replacement, left 10/29/2015  . History of positive PPD 10/29/2015  . Diabetes mellitus, type II, insulin dependent ([place]) 10/18/2015  . Rheumatoid arthritis flare ([place]) 10/18/2015  . AKI (acute kidney injury) ([place]) 10/18/2015  . CKD (chronic kidney disease) stage 3, GFR 30-59 ml/min 10/18/2015  . Nodule of left lung 10/18/2015  . Neuropathy ([place]) 08/22/2014  . Hypothyroidism 08/22/2014  . Chronic pain 08/22/2014  . Chronic congestive heart failure with left ventricular diastolic dysfunction ([place]) 08/08/2013  . Chest pain 08/05/2013  . Aortic stenosis 12/22/2012  . Hyperlipidemia  12/22/2012  . PVC (premature ventricular contraction) 12/22/2012  . HTN (hypertension) 12/22/2012  . 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 Open angle with borderline findings, high risk 12/15/2011  . Cataract, nuclear 05/25/2011   Current Outpatient Prescriptions on File Prior to Visit  Medication Sig Dispense Refill  . Abatacept (ORENCIA IV) Inject into the vein. Daughters are not sure of doses. Received in the short stay area once a month    . acetaminophen (TYLENOL) 500 MG tablet Take 1,000 mg by mouth every 6 (six) hours as needed for mild pain.    Marland Kitchen allopurinol (ZYLOPRIM) 300 MG tablet Take 1 tablet (300 mg total) by mouth daily. 30 tablet 5  . aspirin EC 81 MG EC tablet Take 1 tablet (81 mg total) by mouth daily. 30 tablet 0  . atorvastatin (LIPITOR) 40 MG tablet Take 40 mg by mouth daily at 6 PM.     . BD INSULIN SYRINGE ULTRAFINE 31G X 15/64" 0.3 ML MISC     . cholecalciferol (VITAMIN D) 1000 UNITS tablet Take 1,000 Units by mouth daily.     . cyclobenzaprine (FLEXERIL) 10 MG tablet Take 10 mg by mouth at bedtime.     . diclofenac sodium (VOLTAREN) 1 % GEL 3 g to 3 larges joints up to three times daily (Patient taking differently: Apply 2 g topically 3 (three) times daily. 3 g to 3 larges joints up to three times daily) 1 Tube 3  . divalproex (DEPAKOTE ER) 500 MG 24 hr tablet Take 1 tablet (500 mg total) by mouth daily. 30 tablet 0  . ferrous sulfate (FEOSOL) 325 (65 FE) MG tablet Take  325-650 mg by mouth daily with breakfast.    . folic acid (FOLVITE) 1 MG tablet Take 1 mg by mouth daily.    . insulin NPH-regular Human (NOVOLIN 70/30) (70-30) 100 UNIT/ML injection Inject 6-8 Units into the skin 3 (three) times daily with meals. Sliding source    . levothyroxine (SYNTHROID, LEVOTHROID) 150 MCG tablet Take 1 tablet (150 mcg total) by mouth daily before breakfast.    . losartan (COZAAR) 50 MG tablet Take 50 mg by mouth every evening.    . multivitamin (THERAGRAN) per tablet Take 1 tablet  by mouth daily.     . pregabalin (LYRICA) 75 MG capsule 1 pill in AM  &  2 at night 84 capsule 0  . VITAMIN E PO Take 1 tablet by mouth daily.     No current facility-administered medications on file prior to visit.    Allergies  Allergen Reactions  . Methotrexate Derivatives Other (See Comments)    Elevated creat. Levels  . Other Other (See Comments)    NO Blood products.  [name]- causes low blood sugar (pt states she was taking medication but cant remember what it was for.)  . Penicillins Rash    Has patient had a PCN reaction causing immediate rash, facial/tongue/throat swelling, SOB or lightheadedness with hypotension: Yes Has patient had a PCN reaction causing severe rash involving mucus membranes or skin necrosis: Yes  Has patient had a PCN reaction that required hospitalization No Has patient had a PCN reaction occurring within the last 10 years: No If all of the above answers are "NO", then may proceed with Cephalosporin use.   . Enbrel [Etanercept] Other (See Comments)    Inadequate response    . Remicade [Infliximab] Other (See Comments)    Inadequate response   . Zocor [Simvastatin] Other (See Comments)    Leg cramps      Objective: General: Patient is awake, alert, and oriented x 3 and in no acute distress.  Integument: Skin is warm, dry and supple bilateral. Nails are tender, long, thickened and dystrophic with subungual debris, consistent with onychomycosis, 1-5 bilateral. No open lesions. + callus/ preulcerative lesions present left plantar forefoot and at right>left 4th toe distal tuft (mostly involved), with no signs of infection. Remaining integument unremarkable.  Vasculature:  Dorsalis Pedis pulse 1/4 bilateral. Posterior Tibial pulse  1/4 bilateral. Capillary fill time <3 sec 1-5 bilateral. Scant hair growth to the level of the digits.Temperature gradient within normal limits. No varicosities present bilateral. Trace edema present bilateral, greatest at  Right ankle.   Neurology: The patient has diminished sensation measured with a 5.07/10g Semmes Weinstein Monofilament at all pedal sites bilateral . Vibratory sensation diminished bilateral with tuning fork. No Babinski sign present bilateral.   Musculoskeletal: Pain to right medial and lateral ankle.Bunion and hammertoe pedal deformities noted bilateral. Pes planus foot type. Muscular strength 5/5 in all lower extremity muscular groups bilateral without pain on range of motion except right ankle. No tenderness with calf compression bilateral.  Xray right ankle- alvusion fracture at medial ankle with arthritic changes. No other acute findings. Hardware from previous foot surgery intact.    Assessment and Plan: Problem List Items Addressed This Visit    None    Visit Diagnoses    Acute right ankle pain    -  Primary   Relevant Orders   DG Ankle Complete Right   Avulsion fracture of ankle, right, closed, initial encounter       Dermatophytosis of nail  Corns and callosities       Hammer toe, unspecified laterality       Type 2 diabetes mellitus with diabetic neuropathy, with long-term current use of insulin ([place])         -Examined patient. -Discussed and educated patient on diabetic foot care, especially with  regards to the vascular, neurological and musculoskeletal systems.  -Stressed the importance of good glycemic control and the detriment of not  controlling glucose levels in relation to the foot. -Mechanically debrided all nails 1-5 bilateral using sterile nail nipper and filed with dremel without incident  -Debrided callus at right and left 4th toe and left plantar forefoot using sterile chisel blade without incident. -Gave toe cap -Encouraged patient if continues to be bothersome to consider hammertoe surgery -Dispensed post op shoe and ankle support for avulsion fracture at right ankle; patient can not use cam boot due to mobility issues -Continue with rest, ice,  protection and elevation right ankle -Continue with cane on right -Patient was measured today for diabetic shoes; to hold off on wearing diabetic shoes until right ankle is healed -Answered all patient questions -Patient to return as in 1 month for at right ankle xray to assess healing at right ankle fracture and for pick up diabetic shoes when they arrive -Patient advised to call the office if any problems or questions arise in the meantime.  Landis Martins, DPM

## 2016-03-26 DIAGNOSIS — I130 Hypertensive heart and chronic kidney disease with heart failure and stage 1 through stage 4 chronic kidney disease, or unspecified chronic kidney disease: Secondary | ICD-10-CM | POA: Diagnosis not present

## 2016-03-26 DIAGNOSIS — E1122 Type 2 diabetes mellitus with diabetic chronic kidney disease: Secondary | ICD-10-CM | POA: Diagnosis not present

## 2016-03-26 DIAGNOSIS — E1142 Type 2 diabetes mellitus with diabetic polyneuropathy: Secondary | ICD-10-CM | POA: Diagnosis not present

## 2016-03-26 DIAGNOSIS — Z8673 Personal history of transient ischemic attack (TIA), and cerebral infarction without residual deficits: Secondary | ICD-10-CM | POA: Diagnosis not present

## 2016-03-26 DIAGNOSIS — I509 Heart failure, unspecified: Secondary | ICD-10-CM | POA: Diagnosis not present

## 2016-03-26 DIAGNOSIS — N183 Chronic kidney disease, stage 3 (moderate): Secondary | ICD-10-CM | POA: Diagnosis not present

## 2016-03-28 DIAGNOSIS — Z8673 Personal history of transient ischemic attack (TIA), and cerebral infarction without residual deficits: Secondary | ICD-10-CM | POA: Diagnosis not present

## 2016-03-28 DIAGNOSIS — I509 Heart failure, unspecified: Secondary | ICD-10-CM | POA: Diagnosis not present

## 2016-03-28 DIAGNOSIS — E1122 Type 2 diabetes mellitus with diabetic chronic kidney disease: Secondary | ICD-10-CM | POA: Diagnosis not present

## 2016-03-28 DIAGNOSIS — I130 Hypertensive heart and chronic kidney disease with heart failure and stage 1 through stage 4 chronic kidney disease, or unspecified chronic kidney disease: Secondary | ICD-10-CM | POA: Diagnosis not present

## 2016-03-28 DIAGNOSIS — N183 Chronic kidney disease, stage 3 (moderate): Secondary | ICD-10-CM | POA: Diagnosis not present

## 2016-03-28 DIAGNOSIS — E1142 Type 2 diabetes mellitus with diabetic polyneuropathy: Secondary | ICD-10-CM | POA: Diagnosis not present

## 2016-04-03 ENCOUNTER — Other Ambulatory Visit: Payer: Self-pay | Admitting: Radiology

## 2016-04-07 ENCOUNTER — Ambulatory Visit (HOSPITAL_COMMUNITY): Admission: RE | Admit: 2016-04-07 | Payer: Medicare Other | Source: Ambulatory Visit

## 2016-04-09 DIAGNOSIS — H2513 Age-related nuclear cataract, bilateral: Secondary | ICD-10-CM | POA: Diagnosis not present

## 2016-04-09 DIAGNOSIS — H40023 Open angle with borderline findings, high risk, bilateral: Secondary | ICD-10-CM | POA: Diagnosis not present

## 2016-04-14 ENCOUNTER — Ambulatory Visit (HOSPITAL_COMMUNITY)
Admission: RE | Admit: 2016-04-14 | Discharge: 2016-04-14 | Disposition: A | Discharge status: Home / Self Care | Payer: Medicare Other | Source: Ambulatory Visit | Attending: Rheumatology | Admitting: Rheumatology

## 2016-04-14 ENCOUNTER — Ambulatory Visit: Payer: Medicare Other | Admitting: Physician Assistant

## 2016-04-14 DIAGNOSIS — M0579 Rheumatoid arthritis with rheumatoid factor of multiple sites without organ or systems involvement: Secondary | ICD-10-CM | POA: Diagnosis not present

## 2016-04-14 LAB — COMPREHENSIVE METABOLIC PANEL
ALBUMIN: 4 g/dL (ref 3.5–5.0)
ALT: 10 U/L — ABNORMAL LOW (ref 14–54)
AST: 31 U/L (ref 15–41)
Alkaline Phosphatase: 68 U/L (ref 38–126)
Anion gap: 9 (ref 5–15)
BILIRUBIN TOTAL: 1.5 mg/dL — AB (ref 0.3–1.2)
BUN: 14 mg/dL (ref 6–20)
CO2: 26 mmol/L (ref 22–32)
Calcium: 9.5 mg/dL (ref 8.9–10.3)
Chloride: 103 mmol/L (ref 101–111)
Creatinine, Ser: 1.33 mg/dL — ABNORMAL HIGH (ref 0.44–1.00)
GFR calc Af Amer: 43 mL/min — ABNORMAL LOW (ref >60)
GFR calc non Af Amer: 37 mL/min — ABNORMAL LOW (ref >60)
GLUCOSE: 144 mg/dL — AB (ref 65–99)
POTASSIUM: 5.2 mmol/L — AB (ref 3.5–5.1)
Sodium: 138 mmol/L (ref 135–145)
TOTAL PROTEIN: 7.4 g/dL (ref 6.5–8.1)

## 2016-04-14 LAB — CBC WITH DIFFERENTIAL/PLATELET
BASOS ABS: 0 10*3/uL (ref 0.0–0.1)
BASOS PCT: 1 %
Eosinophils Absolute: 0.2 10*3/uL (ref 0.0–0.7)
Eosinophils Relative: 3 %
HEMATOCRIT: 33.6 % — AB (ref 36.0–46.0)
HEMOGLOBIN: 11.1 g/dL — AB (ref 12.0–15.0)
Lymphocytes Relative: 47 %
Lymphs Abs: 3.5 10*3/uL (ref 0.7–4.0)
MCH: 29 pg (ref 26.0–34.0)
MCHC: 33 g/dL (ref 30.0–36.0)
MCV: 87.7 fL (ref 78.0–100.0)
MONO ABS: 0.5 10*3/uL (ref 0.1–1.0)
Monocytes Relative: 7 %
NEUTROS ABS: 3.1 10*3/uL (ref 1.7–7.7)
NEUTROS PCT: 42 %
Platelets: 333 10*3/uL (ref 150–400)
RBC: 3.83 MIL/uL — ABNORMAL LOW (ref 3.87–5.11)
RDW: 16.4 % — AB (ref 11.5–15.5)
WBC: 7.3 10*3/uL (ref 4.0–10.5)

## 2016-04-14 MED ORDER — DIPHENHYDRAMINE HCL 25 MG PO CAPS
ORAL_CAPSULE | ORAL | Status: AC
  Filled 2016-04-14: qty 1

## 2016-04-14 MED ORDER — DIPHENHYDRAMINE HCL 25 MG PO CAPS
25.0000 mg | ORAL_CAPSULE | ORAL | Status: DC
  Administered 2016-04-14: 25 mg via ORAL

## 2016-04-14 MED ORDER — SODIUM CHLORIDE 0.9 % IV SOLN
750.0000 mg | INTRAVENOUS | Status: DC
  Administered 2016-04-14: 750 mg via INTRAVENOUS
  Filled 2016-04-14: qty 30

## 2016-04-14 MED ORDER — ACETAMINOPHEN 325 MG PO TABS
650.0000 mg | ORAL_TABLET | ORAL | Status: DC
  Administered 2016-04-14: 12:00:00 650 mg via ORAL

## 2016-04-14 MED ORDER — ACETAMINOPHEN 325 MG PO TABS
ORAL_TABLET | ORAL | Status: AC
  Filled 2016-04-14: qty 2

## 2016-04-14 NOTE — Progress Notes (Signed)
Labs are stable.

## 2016-04-18 ENCOUNTER — Ambulatory Visit (INDEPENDENT_AMBULATORY_CARE_PROVIDER_SITE_OTHER): Payer: Medicare Other | Admitting: Physician Assistant

## 2016-04-18 VITALS — BP 118/62 | HR 86 | Ht 64.0 in | Wt 188.0 lb

## 2016-04-18 DIAGNOSIS — R001 Bradycardia, unspecified: Secondary | ICD-10-CM

## 2016-04-18 DIAGNOSIS — R55 Syncope and collapse: Secondary | ICD-10-CM | POA: Diagnosis not present

## 2016-04-18 DIAGNOSIS — I10 Essential (primary) hypertension: Secondary | ICD-10-CM | POA: Diagnosis not present

## 2016-04-18 DIAGNOSIS — I639 Cerebral infarction, unspecified: Secondary | ICD-10-CM

## 2016-04-18 NOTE — Progress Notes (Signed)
Cardiology Office Note Date:  04/18/2016  Patient ID:  [name] December 05, 1940, MRN [id number] PCP:  Lilian Coma, MD  Cardiologist:  Dr. Marlou Porch Electrophysiologist: new to Dr. Rayann Heman last hospital stay    Chief Complaint: post hospital f/u  History of Present Illness: [name] is a 76 y.o. female with history of CRI (III), HTN, DM, arthritis, no known CAD with negative cath in 2015, DVT in 2015 tx with xarelto, comes in today to be seen for Dr. Rayann Heman.  She was seen by him last (for the 1st time) in Feb hospitalization after a syncopal event, noted to be bradycardia.  The patient was hospitalized in Feb after a syncopal event, initially code stroke called but cancelled.  She was observed to have bradycardia her BB held and EP consulted.  Though she was in fact bradycardic, not felt to have been the etiology of her event/clinical picture.  There is hx of years of sudden "blank episodes without LOC, number of falls some mechanical, other unclear. One of note she got out of bed to grab a snack, when in the kitchen said she felt funny, maybe dizzy or spinning, and then was on the floor.  Her daughter says that night she had been to visit and she was fine, later that evening the daughter got a call from her Mom's house number about 11:30PM but no one would answer from the other end.  About 1 minute later another call from her mother's home number but no response, and a couple minutes later the same thing so she rushed over and found her Mom on the kitchen flor seems a little confused, required help getting up and could not tell her what happend.  The event that led to the hospital stay despite regaining consciousness she remained markedly lethargic for several hours despite stable VS outside of SB 40's.  Event was proceeded by an unusual headache that morning/day as well a course of steroids the week pior 2/2 RA flare up.  She was found to have 2 punctate areas of infarct on  her brain MRI though this also not felt by neuro to explain her symptoms, negative EEG, though goven picture was started on Depakote.  Our recommendation was to stop her BB avoid nodal blocking agents for BP and f/u with event monitoring  She is feeling well, denies any kind of CP but occassionally feels a little flutter in her chest, she has not had any dizziness, no near syncope or syncope or "events" of any kind since her hospital stay.  She has f/u nest week with neurology.  She saw her podiatrist for routine foot care and mentioned some mild pain/swelling to her R ankle and found to have a fracture, she doesn't know how/when it happened.   Past Medical History:  Diagnosis Date  . Allergic rhinitis   . Anemia   . Aortic stenosis 12/22/2012   Mild 3/14-2.35 m/s peak velocity  . CHF (congestive heart failure) ([place])   . Chronic pain   . CKD (chronic kidney disease), stage III   . Diabetes mellitus   . Diverticulosis   . DVT (deep venous thrombosis) ([place])   . Glaucoma   . Hemorrhoids    internal  . HTN (hypertension)   . Hypercholesteremia   . Hyperlipidemia 12/22/2012  . Hypothyroidism   . Meningioma ([place]) 2008  . Osteoarthritis   . Peripheral neuropathy ([place])   . PPD positive    6 months ago  .  PVC (premature ventricular contraction) 12/22/2012  . Rheumatoid arthritis(714.0)   . Sickle cell trait ([place])   . 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 ABDOMINAL HYSTERECTOMY  1978   TAH,& BSO 1  . fistula repair  03/2010   partial colectomy  . intracranial surgery  11/2007   removal of meningioma  . JOINT REPLACEMENT  2001   left TKR  . LEFT HEART CATHETERIZATION WITH CORONARY ANGIOGRAM N/A 08/08/2013   Procedure: LEFT HEART CATHETERIZATION WITH CORONARY ANGIOGRAM;  Surgeon: Peter M Martinique, MD;  Location: [facility] CATH LAB;  Service: Cardiovascular;  Laterality: N/A;  .  ROTATOR CUFF REPAIR  1999   BIlateral  . SIGMOIDECTOMY  2012  . TONSILLECTOMY AND ADENOIDECTOMY    . TOTAL THYROIDECTOMY  1992  . wrist sx     right     Current Outpatient Prescriptions  Medication Sig Dispense Refill  . Abatacept (ORENCIA IV) Inject into the vein. Daughters are not sure of doses. Received in the short stay area once a month    . acetaminophen (TYLENOL) 500 MG tablet Take 1,000 mg by mouth every 6 (six) hours as needed for mild pain.    Marland Kitchen allopurinol (ZYLOPRIM) 300 MG tablet Take 1 tablet (300 mg total) by mouth daily. 30 tablet 5  . aspirin EC 81 MG EC tablet Take 1 tablet (81 mg total) by mouth daily. 30 tablet 0  . atorvastatin (LIPITOR) 40 MG tablet Take 40 mg by mouth daily at 6 PM.     . BD INSULIN SYRINGE ULTRAFINE 31G X 15/64" 0.3 ML MISC     . cholecalciferol (VITAMIN D) 1000 UNITS tablet Take 1,000 Units by mouth daily.     . cyclobenzaprine (FLEXERIL) 10 MG tablet Take 10 mg by mouth at bedtime.     . diclofenac sodium (VOLTAREN) 1 % GEL 3 g to 3 larges joints up to three times daily (Patient taking differently: Apply 2 g topically 3 (three) times daily. 3 g to 3 larges joints up to three times daily) 1 Tube 3  . divalproex (DEPAKOTE ER) 500 MG 24 hr tablet Take 1 tablet (500 mg total) by mouth daily. 30 tablet 0  . ferrous sulfate (FEOSOL) 325 (65 FE) MG tablet Take 325-650 mg by mouth daily with breakfast.    . folic acid (FOLVITE) 1 MG tablet Take 1 mg by mouth daily.    . insulin NPH-regular Human (NOVOLIN 70/30) (70-30) 100 UNIT/ML injection Inject 6-8 Units into the skin 3 (three) times daily with meals. Sliding source    . levothyroxine (SYNTHROID, LEVOTHROID) 150 MCG tablet Take 1 tablet (150 mcg total) by mouth daily before breakfast.    . losartan (COZAAR) 50 MG tablet Take 50 mg by mouth every evening.    . multivitamin (THERAGRAN) per tablet Take 1 tablet by mouth daily.     . pregabalin (LYRICA) 75 MG capsule 1 pill in AM  &  2 at night 84 capsule 0   . VITAMIN E PO Take 1 tablet by mouth daily.     No current facility-administered medications for this visit.     Allergies:   Methotrexate derivatives; Other; Penicillins; Remicade [infliximab]; and Zocor [simvastatin]   Social History:  The patient  reports that she quit smoking about 54 years ago. Her smoking use included Cigarettes. She has a 0.20 pack-year smoking history.  She has never used smokeless tobacco. She reports that she drinks alcohol. She reports that she does not use drugs.   Family History:  The patient's family history includes CVA in her mother; Diabetes in her mother; Hypertension in her father; Hypotension in her mother; Kidney failure in her father.  ROS:  Please see the history of present illness.  All other systems are reviewed and otherwise negative.   PHYSICAL EXAM:  VS:  BP 118/62   Pulse 86   Ht 5\' 4"  (1.626 m)   Wt 188 lb (85.3 kg)   LMP  (LMP Unknown)   BMI 32.27 kg/m  BMI: Body mass index is 32.27 kg/m. Well nourished, well developed, in no acute distress  HEENT: normocephalic, atraumatic  Neck: no JVD, carotid bruits or masses Cardiac:  RRR; 1++ SM, no rubs, or gallops Lungs:  CTA b/l, no wheezing, rhonchi or rales  Abd: soft, nontender MS: no deformity or atrophy Ext: no pitting edema she has a orthopedic shoe on the R Skin: warm and dry, no rash Neuro:  No gross deficits appreciated Psych: euthymic mood, full affect  EKG: 02/27/16 SB, 50bpm, PR 122ms, QRS 48ms QTc 441ms TELEMETRY during her stay was reviewed by myself: SR/SB, 40's-70's Cardiac catheterization 08/08/13 normal coronary arteries Event monitor 2/14showed no adverse arrhythmias.PVC's ECHO 10/19/15  Study Conclusions - Left ventricle: The cavity size was normal. Systolic function was normal. The estimated ejection fraction was in the range of 55% to 60%. Wall motion was normal; there were no regional wall motion abnormalities. Left ventricular diastolic  function parameters were normal. - Aortic valve: There was mild stenosis. There was mild regurgitation. Valve area (VTI): 1.25 cm^2. Valve area (Vmax): 1.01 cm^2. Valve area (Vmean): 1.15 cm^2. - Mitral valve: There was mild regurgitation. - Left atrium: The atrium was mildly dilated. - Right atrium: The atrium was mildly dilated.   NUCLEAR STRESS TEST 10/21/15.  FINDINGS: Perfusion: Small fixed perfusion defect at the septal wall towards the apex. No reversible perfusion defect identified. Wall Motion: Normal left ventricular wall motion. No left ventricular dilation Left Ventricular Ejection Fraction: 53 % End diastolic volume 947 ml End systolic volume 50 m IMPRESSION: 1. Small fixed perfusion defect at the septal wall towards the apex. No reversible perfusion defect. 2. Normal left ventricular wall motion. 3. Left ventricular ejection fraction 53% 4. Non invasive risk stratification*: Low   Recent Labs: 10/18/2015: B Natriuretic Peptide 46.4 02/27/2016: TSH 14.791 04/14/2016: ALT 10; BUN 14; Creatinine, Ser 1.33; Hemoglobin 11.1; Platelets 333; Potassium 5.2; Sodium 138  02/28/2016: Cholesterol 179; HDL 47; LDL Cholesterol 107; Total CHOL/HDL Ratio 3.8; Triglycerides 123; VLDL 25   Estimated Creatinine Clearance: 37.4 mL/min (A) (by C-G formula based on SCr of 1.33 mg/dL (H)).   Wt Readings from Last 3 Encounters:  04/18/16 188 lb (85.3 kg)  04/14/16 198 lb (89.8 kg)  03/10/16 198 lb (89.8 kg)     Other studies reviewed: Additional studies/records reviewed today include: summarized above  ASSESSMENT AND PLAN:  1. Syncope? Seizure     No further events sine her hospital stay     On Depakote, off metoprolol  The patient is reminded of [place] law, no driving 6 months post fainting/syncope.  2. Bradycardia     I have reviewed her EM tracings in Epic, it has not been read/signed by MD SR only, no arrhythmias noted, no HR < 50 Symptoms reported are  palpitations, dizziness, lightheadedness,CP  The patient denies the c/o CP, states  the dizziness she finally correlated with low BS and resolved after eating, palpitations are rare, only noted singe PAC/PVC with these tracings.  Artifact was noted, text report states 704 hours, 596 were unusuable  3. HTN     Looks good, no changes  Disposition: F/u with neurology.  It appears much of her EM was artifact, though of what I saw, all SR, no significant bradycardia.  Will see her back in 6 months, sooner if needed.  Shoud she have recurrent symptoms can consider repeating her monitoring.  Current medicines are reviewed at length with the patient today.  The patient did not have any concerns regarding medicines.  Haywood Lasso, PA-C 04/18/2016 2:17 PM     [facility] [address] [place] [address] [phone] (office)  [phone] (fax)

## 2016-04-18 NOTE — Patient Instructions (Signed)
Medication Instructions:   Your physician recommends that you continue on your current medications as directed. Please refer to the Current Medication list given to you today.    If you need a refill on your cardiac medications before your next appointment, please call your pharmacy.  Labwork: NONE ORDERED  TODAY    Testing/Procedures: NONE ORDERED  TODAY    Follow-Up:  Your physician wants you to follow-up in:  IN  6 MONTHS WITH DR ALLRED   You will receive a reminder letter in the mail two months in advance. If you don't receive a letter, please call our office to schedule the follow-up appointment.      Any Other Special Instructions Will Be Listed Below (If Applicable).                                                                                                                                                   

## 2016-04-22 ENCOUNTER — Encounter: Payer: Self-pay | Admitting: Sports Medicine

## 2016-04-22 ENCOUNTER — Ambulatory Visit (INDEPENDENT_AMBULATORY_CARE_PROVIDER_SITE_OTHER): Payer: Medicare Other | Admitting: Sports Medicine

## 2016-04-22 ENCOUNTER — Ambulatory Visit (INDEPENDENT_AMBULATORY_CARE_PROVIDER_SITE_OTHER): Payer: Medicare Other

## 2016-04-22 DIAGNOSIS — M7752 Other enthesopathy of left foot: Secondary | ICD-10-CM

## 2016-04-22 DIAGNOSIS — M778 Other enthesopathies, not elsewhere classified: Secondary | ICD-10-CM

## 2016-04-22 DIAGNOSIS — S82891A Other fracture of right lower leg, initial encounter for closed fracture: Secondary | ICD-10-CM | POA: Diagnosis not present

## 2016-04-22 DIAGNOSIS — M2041 Other hammer toe(s) (acquired), right foot: Secondary | ICD-10-CM

## 2016-04-22 DIAGNOSIS — L84 Corns and callosities: Secondary | ICD-10-CM | POA: Diagnosis not present

## 2016-04-22 DIAGNOSIS — M779 Enthesopathy, unspecified: Principal | ICD-10-CM

## 2016-04-22 DIAGNOSIS — E1142 Type 2 diabetes mellitus with diabetic polyneuropathy: Secondary | ICD-10-CM | POA: Diagnosis not present

## 2016-04-22 DIAGNOSIS — M25571 Pain in right ankle and joints of right foot: Secondary | ICD-10-CM

## 2016-04-22 DIAGNOSIS — M2042 Other hammer toe(s) (acquired), left foot: Secondary | ICD-10-CM

## 2016-04-22 DIAGNOSIS — S82891D Other fracture of right lower leg, subsequent encounter for closed fracture with routine healing: Secondary | ICD-10-CM

## 2016-04-22 DIAGNOSIS — M19072 Primary osteoarthritis, left ankle and foot: Secondary | ICD-10-CM

## 2016-04-22 MED ORDER — TRIAMCINOLONE ACETONIDE 10 MG/ML IJ SUSP: 10.0000 mg | Freq: Once | INTRAMUSCULAR | Status: DC

## 2016-04-22 NOTE — Progress Notes (Signed)
Patient ID: [name], female   DOB: Apr 09, 1940, 76 y.o.   MRN: [id number]  Subjective: [name] is a 76 y.o. female patient with history of type 2 diabetes who returns to office today for follow up of chip fracture at ankle, has been in surgical shoes and reports no pain to ankle and has been wearing anklet. Patient admits new pain at the top of left foot at area of bone spur. Patient is also here to pick up diabetic shoes.   Patient Active Problem List   Diagnosis Date Noted  . 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 Elevated d-dimer   . Encephalopathy   . Sinus bradycardia   . Acute encephalopathy 02/27/2016  . Seropositive rheumatoid arthritis of multiple sites ([place]) 01/17/2016  . High risk medications (not anticoagulants) long-term use 01/17/2016  . Bilateral hand pain 01/17/2016  . Bilateral wrist pain 01/17/2016  . Pain in right elbow 01/17/2016  . Left shoulder pain 01/17/2016  . Encounter for therapeutic drug monitoring 11/02/2015  . Bronchiectasis without complication ([place]) 74/25/9563  . Chronic gout involving toe without tophus 10/30/2015  . High risk medication use 10/29/2015  . Gout 10/29/2015  . H/O total knee replacement, left 10/29/2015  . History of positive PPD 10/29/2015  . Diabetes mellitus, type II, insulin dependent ([place]) 10/18/2015  . Rheumatoid arthritis flare ([place]) 10/18/2015  . AKI (acute kidney injury) ([place]) 10/18/2015  . CKD (chronic kidney disease) stage 3, GFR 30-59 ml/min 10/18/2015  . Nodule of left lung 10/18/2015  . Neuropathy 08/22/2014  . Hypothyroidism 08/22/2014  . Chronic pain 08/22/2014  . Chronic congestive heart failure with left ventricular diastolic dysfunction ([place]) 08/08/2013  . Chest pain 08/05/2013  . Aortic stenosis 12/22/2012  . Hyperlipidemia 12/22/2012  . PVC (premature ventricular contraction) 12/22/2012  . HTN (hypertension) 12/22/2012  . 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 Open angle  with borderline findings, high risk 12/15/2011  . Cataract, nuclear 05/25/2011   Current Outpatient Prescriptions on File Prior to Visit  Medication Sig Dispense Refill  . Abatacept (ORENCIA IV) Inject into the vein. Daughters are not sure of doses. Received in the short stay area once a month    . acetaminophen (TYLENOL) 500 MG tablet Take 1,000 mg by mouth every 6 (six) hours as needed for mild pain.    Marland Kitchen allopurinol (ZYLOPRIM) 300 MG tablet Take 1 tablet (300 mg total) by mouth daily. 30 tablet 5  . aspirin EC 81 MG EC tablet Take 1 tablet (81 mg total) by mouth daily. 30 tablet 0  . atorvastatin (LIPITOR) 40 MG tablet Take 40 mg by mouth daily at 6 PM.     . BD INSULIN SYRINGE ULTRAFINE 31G X 15/64" 0.3 ML MISC     . cholecalciferol (VITAMIN D) 1000 UNITS tablet Take 1,000 Units by mouth daily.     . cyclobenzaprine (FLEXERIL) 10 MG tablet Take 10 mg by mouth at bedtime.     . diclofenac sodium (VOLTAREN) 1 % GEL 3 g to 3 larges joints up to three times daily (Patient taking differently: Apply 2 g topically 3 (three) times daily. 3 g to 3 larges joints up to three times daily) 1 Tube 3  . divalproex (DEPAKOTE ER) 500 MG 24 hr tablet Take 1 tablet (500 mg total) by mouth daily. 30 tablet 0  . ferrous sulfate (FEOSOL) 325 (65 FE) MG tablet Take 325-650 mg by mouth daily with breakfast.    . folic acid (FOLVITE) 1 MG  tablet Take 1 mg by mouth daily.    . insulin NPH-regular Human (NOVOLIN 70/30) (70-30) 100 UNIT/ML injection Inject 6-8 Units into the skin 3 (three) times daily with meals. Sliding source    . levothyroxine (SYNTHROID, LEVOTHROID) 150 MCG tablet Take 1 tablet (150 mcg total) by mouth daily before breakfast.    . losartan (COZAAR) 50 MG tablet Take 50 mg by mouth every evening.    . multivitamin (THERAGRAN) per tablet Take 1 tablet by mouth daily.     . pregabalin (LYRICA) 75 MG capsule 1 pill in AM  &  2 at night 84 capsule 0  . VITAMIN E PO Take 1 tablet by mouth daily.      No current facility-administered medications on file prior to visit.    Allergies  Allergen Reactions  . Methotrexate Derivatives Other (See Comments)    Elevated creat. Levels  . Other Other (See Comments)    NO Blood products.  [name]- causes low blood sugar (pt states she was taking medication but cant remember what it was for.)  . Penicillins Rash    Has patient had a PCN reaction causing immediate rash, facial/tongue/throat swelling, SOB or lightheadedness with hypotension: Yes Has patient had a PCN reaction causing severe rash involving mucus membranes or skin necrosis: Yes  Has patient had a PCN reaction that required hospitalization No Has patient had a PCN reaction occurring within the last 10 years: No If all of the above answers are "NO", then may proceed with Cephalosporin use.   . Remicade [Infliximab] Other (See Comments)    Inadequate response   . Zocor [Simvastatin] Other (See Comments)    Leg cramps      Objective: General: Patient is awake, alert, and oriented x 3 and in no acute distress.  Integument: Skin is warm, dry and supple bilateral. Nails are short thickened and dystrophic with subungual debris, consistent with onychomycosis, 1-5 bilateral. No open lesions. + callus/ preulcerative lesions present left plantar forefoot and at right>left 4th toe distal tuft (mostly involved) with maceration from patient leaving toe cap on left 4th toe for several days, with no signs of infection. Remaining integument unremarkable.  Vasculature:  Dorsalis Pedis pulse 1/4 bilateral. Posterior Tibial pulse  1/4 bilateral. Capillary fill time <3 sec 1-5 bilateral. Scant hair growth to the level of the digits.Temperature gradient within normal limits. No varicosities present bilateral. Trace edema present bilateral.   Neurology: The patient has diminished sensation measured with a 5.07/10g Semmes Weinstein Monofilament at all pedal sites bilateral . Vibratory sensation  diminished bilateral with tuning fork. No Babinski sign present bilateral.   Musculoskeletal:No pain to right medial and lateral ankle.Bunion and hammertoe pedal deformities noted bilateral. Pes planus foot type with dorsal bone spur at medial midfoot on left. Muscular strength 5/5 in all lower extremity muscular groups bilateral without pain on range of motion except at left midfoot. No pain with motion to right ankle. No tenderness with calf compression bilateral.  Xray right ankle-Resolved alvusion fracture at medial ankle with arthritic changes. No other acute findings. Hardware from previous foot surgery intact.    Assessment and Plan: Problem List Items Addressed This Visit    None    Visit Diagnoses    Capsulitis of foot, left    -  Primary   Relevant Medications   triamcinolone acetonide (KENALOG) 10 MG/ML injection 10 mg   Right malleolar fracture, closed, with routine healing, subsequent encounter       Relevant Orders  DG Ankle Complete Right (Completed)   Acute right ankle pain       Resolved   Arthritis of foot, left       Relevant Medications   triamcinolone acetonide (KENALOG) 10 MG/ML injection 10 mg     -Examined patient. -Discussed and educated patient on diabetic foot care, especially with  regards to the vascular, neurological and musculoskeletal systems.  -Stressed the importance of good glycemic control and the detriment of not  controlling glucose levels in relation to the foot. -Applied betadine to macerated 4th toe on left and advised patient to do the same and to give herself a break from the toe cap until it resolves -Encouraged patient if callus to toes continues to be bothersome to consider hammertoe surgery -Patient may wean from post op shoe to normal shoe since right ankle fracture is improved -May continue with anklet as needed for edema control -Continue with cane on right -Patient requests injection for left foot pain  -After oral consent and  aseptic prep, injected a mixture containing 1 ml of 2% plain lidocaine, 1 ml 0.5% plain marcaine, 0.5 ml of kenalog 10 and 0.5 ml of dexamethasone phosphate into left dorsal medial midfoot at area of bone spur without complication. Post-injection care discussed with patient.  -Patient picked up diabetic shoes and was properly fitted. Shoes were dispensed with break in and wear explained -Answered all patient questions -Patient to return as in 1 month for final check of right ankle and follow up left foot pain after injection -Patient advised to call the office if any problems or questions arise in the meantime.  Landis Martins, DPM

## 2016-04-25 ENCOUNTER — Other Ambulatory Visit: Payer: Self-pay | Admitting: Radiology

## 2016-04-25 DIAGNOSIS — M0579 Rheumatoid arthritis with rheumatoid factor of multiple sites without organ or systems involvement: Secondary | ICD-10-CM

## 2016-04-25 NOTE — Progress Notes (Signed)
Orders are in system for her infusions   TB gold not indicated, she has had + in the past she has chest xrays   She will have a CT scan on 05/05/16 ordered by Dr Chase Caller.

## 2016-04-29 ENCOUNTER — Ambulatory Visit (INDEPENDENT_AMBULATORY_CARE_PROVIDER_SITE_OTHER): Payer: Medicare Other | Admitting: Neurology

## 2016-04-29 ENCOUNTER — Encounter: Payer: Self-pay | Admitting: Neurology

## 2016-04-29 VITALS — BP 121/60 | HR 72 | Resp 18 | Ht 64.0 in | Wt 197.0 lb

## 2016-04-29 DIAGNOSIS — I639 Cerebral infarction, unspecified: Secondary | ICD-10-CM

## 2016-04-29 DIAGNOSIS — R6889 Other general symptoms and signs: Secondary | ICD-10-CM

## 2016-04-29 NOTE — Progress Notes (Signed)
[facility] [address] [place]. [place] [address] [phone]       OFFICE FOLLOW-UP NOTE  Ms. NEYA CREEGAN Date of Birth:  March 02, 1940 Medical Record Number:  [id number]   HPI: Ms. Tancredi is a 76 year old African [place] lady seen today for the first office follow-up visit following hospital admission for syncopal event on 02/29/16. She is a 75-yo RH woman who presents to the ED as a CODE STROKE. History is obtained from the patient who is a limited historian. Additional information is obtained from the attending paramedics and from the review of her medical record. Per EMS, the patient apparently drove herself to her friend's house this afternoon to visit. While there the patient reportedly became acutely diaphoretic, dizzy, and weak all over. Her friend called 911. EMS arrived and found the patient to be somewhat lethargic, c/o dizziness, headache, and L foot numbness. She was transported to the ED. I met her on arrival with the rest of the stroke team. She was taken for an emergent CTH which showed no acute abnormality. On exam, she was encephalopathic, c/o headache and L shoulder pain. She denies any focal weakness or numbness. No focal deficits were identified on her exam with NIHSS score 0. There was little to support stroke at this time so CODE STROKE was canceled.  Time last known well: 1530 NIHSS score: 0 tPA given?: No, suspect alternate diagnosis.  MRI scan of the brain showed 2 tiny punctate left basilar pleural pontine diffusion hyperintensities which were possibly incidental lacunar infarcts. MRA of the brain was normal. LDL cholesterol 170 mg percent. Transthoracic echocardiogram and carotid ultrasound studies were both normal. I felt the patient's MRI abnormalities represented incidental tiny lacunar infarcts in setting of hypotension from her bradycardia syncopal episode. Patient states she had similar syncopal episode years ago when her sugars were low.  She's done well since discharge has not had any recurrent syncopal episodes or any other stroke like neurological symptoms. She is tolerating aspirin well without any side effects. I had started the patient on Depakote briefly in case these episodes were seizure-like however EEG done in the hospital was normal and patient ran out of Depakote for week ago and has done well so far without recurrent symptoms.  ROS:   14 system review of systems is positive for fatigue, excessive sweating, ringing in the ears, blurred vision, shortness of breath, leg swelling, murmur, black stools, restless legs, allergies, joint pain and swelling, back pain and aching muscles, neck pain and stiffness, anemia and all other systems negative  PMH:  Past Medical History:  Diagnosis Date  . Allergic rhinitis   . Anemia   . Aortic stenosis 12/22/2012   Mild 3/14-2.35 m/s peak velocity  . CHF (congestive heart failure) ([place])   . Chronic pain   . CKD (chronic kidney disease), stage III   . Diabetes mellitus   . Diverticulosis   . DVT (deep venous thrombosis) ([place])   . Glaucoma   . Hemorrhoids    internal  . HTN (hypertension)   . Hypercholesteremia   . Hyperlipidemia 12/22/2012  . Hypothyroidism   . Meningioma ([place]) 2008  . Osteoarthritis   . Peripheral neuropathy   . PPD positive    6 months ago  . PVC (premature ventricular contraction) 12/22/2012  . Rheumatoid arthritis(714.0)   . Sickle cell trait ([place])   . 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 Marital status: Married    Spouse name: N/A  . Number of children: N/A  . Years of education: N/A   Occupational History  . Not on file.   Social History Main Topics  . Smoking status: Former Smoker    Packs/day: 0.10    Years: 2.00    Types: Cigarettes    Quit date: 01/06/1962  . Smokeless tobacco: Never Used     Comment: Socially   .  Alcohol use Yes     Comment: occasionally  . Drug use: No  . Sexual activity: Not on file   Other Topics Concern  . Not on file   Social History Narrative   Daycare Provider   Drinks about 1 cup of coffee a day     Medications:   Current Outpatient Prescriptions on File Prior to Visit  Medication Sig Dispense Refill  . Abatacept (ORENCIA IV) Inject into the vein. Daughters are not sure of doses. Received in the short stay area once a month    . acetaminophen (TYLENOL) 500 MG tablet Take 1,000 mg by mouth every 6 (six) hours as needed for mild pain.    Marland Kitchen allopurinol (ZYLOPRIM) 300 MG tablet Take 1 tablet (300 mg total) by mouth daily. 30 tablet 5  . aspirin EC 81 MG EC tablet Take 1 tablet (81 mg total) by mouth daily. 30 tablet 0  . atorvastatin (LIPITOR) 40 MG tablet Take 40 mg by mouth daily at 6 PM.     . BD INSULIN SYRINGE ULTRAFINE 31G X 15/64" 0.3 ML MISC     . cholecalciferol (VITAMIN D) 1000 UNITS tablet Take 1,000 Units by mouth daily.     . cyclobenzaprine (FLEXERIL) 10 MG tablet Take 10 mg by mouth at bedtime.     . diclofenac sodium (VOLTAREN) 1 % GEL 3 g to 3 larges joints up to three times daily (Patient taking differently: Apply 2 g topically 3 (three) times daily. 3 g to 3 larges joints up to three times daily) 1 Tube 3  . divalproex (DEPAKOTE ER) 500 MG 24 hr tablet Take 1 tablet (500 mg total) by mouth daily. 30 tablet 0  . ferrous sulfate (FEOSOL) 325 (65 FE) MG tablet Take 325-650 mg by mouth daily with breakfast.    . folic acid (FOLVITE) 1 MG tablet Take 1 mg by mouth daily.    . insulin NPH-regular Human (NOVOLIN 70/30) (70-30) 100 UNIT/ML injection Inject 6-8 Units into the skin 3 (three) times daily with meals. Sliding source    . levothyroxine (SYNTHROID, LEVOTHROID) 150 MCG tablet Take 1 tablet (150 mcg total) by mouth daily before breakfast.    . losartan (COZAAR) 50 MG tablet Take 50 mg by mouth every evening.    . multivitamin (THERAGRAN) per tablet Take 1  tablet by mouth daily.     . pregabalin (LYRICA) 75 MG capsule 1 pill in AM  &  2 at night 84 capsule 0  . VITAMIN E PO Take 1 tablet by mouth daily.     Current Facility-Administered Medications on File Prior to Visit  Medication Dose Route Frequency Provider Last Rate Last Dose  . triamcinolone acetonide (KENALOG) 10 MG/ML injection 10 mg  10 mg Other Once Landis Martins, DPM        Allergies:   Allergies  Allergen Reactions  . Methotrexate Derivatives Other (See Comments)    Elevated creat. Levels  . Other Other (  See Comments)    NO Blood products.  [name]- causes low blood sugar (pt states she was taking medication but cant remember what it was for.)  . Penicillins Rash    Has patient had a PCN reaction causing immediate rash, facial/tongue/throat swelling, SOB or lightheadedness with hypotension: Yes Has patient had a PCN reaction causing severe rash involving mucus membranes or skin necrosis: Yes  Has patient had a PCN reaction that required hospitalization No Has patient had a PCN reaction occurring within the last 10 years: No If all of the above answers are "NO", then may proceed with Cephalosporin use.   . Remicade [Infliximab] Other (See Comments)    Inadequate response   . Zocor [Simvastatin] Other (See Comments)    Leg cramps     Physical Exam General: Obese elderly African-American lady, seated, in no evident distress Head: head normocephalic and atraumatic.  Neck: supple with no carotid or supraclavicular bruits Cardiovascular: regular rate and rhythm, no murmurs Musculoskeletal: no deformity Skin:  no rash/petichiae Vascular:  Normal pulses all extremities Vitals:   04/29/16 1443  BP: 121/60  Pulse: 72  Resp: 18   Neurologic Exam Mental Status: Awake and fully alert. Oriented to place and time. Recent and remote memory intact. Attention span, concentration and fund of knowledge appropriate. Mood and affect appropriate.  Cranial Nerves: Fundoscopic  exam reveals sharp disc margins. Pupils equal, briskly reactive to light. Extraocular movements full without nystagmus. Visual fields full to confrontation. Hearing intact. Facial sensation intact. Face, tongue, palate moves normally and symmetrically.  Motor: Normal bulk and tone. Normal strength in all tested extremity muscles. Sensory.: intact to touch ,pinprick .position and vibratory sensation.  Coordination: Rapid alternating movements normal in all extremities. Finger-to-nose and heel-to-shin performed accurately bilaterally. Gait and Station: Arises from chair without difficulty. Stance is normal. Gait demonstrates normal stride length and balance . Unable to heel, toe and tandem walk without difficulty.  Reflexes: 1+ and symmetric. Toes downgoing.   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    PLAN: I had a long discussion with the patient with regards to her spells of brief altered consciousness which of on clear etiology possibly related to hypoglycemia versus bradycardia/syncope. Complex partial seizures less likely. Agree with discontinuing Depakote and observation. Continue aspirin for stroke prevention given a silent brainstem lacunar infarcts as well as strict control of hypertension with blood pressure goal below 130/90 and lipids with LDL cholesterol goal below 70 mg percent. I also encouraged her to eat a healthy diet and to be active and exercise and lose weight. She will return for follow-up in the future only as necessary and no routine scheduled follow-up appointment was made. Greater than 50% of time during this 25 minute visit was spent on counseling,explanation of diagnosis, planning of further management, discussion with patient and family and coordination of care Antony Contras, MD  [facility] [address] [place], [place] [address]  Phone [phone] Fax [phone] Note: This document was prepared with digital dictation and possible smart phrase technology. Any transcriptional errors that result from this process are unintentional

## 2016-04-29 NOTE — Patient Instructions (Signed)
I had a long discussion with the patient with regards to her spells of brief altered consciousness which of on clear etiology possibly related to hypoglycemia versus bradycardia/syncope. Complex partial seizures less likely. Agree with discontinuing Depakote and observation. Continue aspirin for stroke prevention given a silent brainstem lacunar infarcts as well as strict control of hypertension with blood pressure goal below 130/90 and lipids with LDL cholesterol goal below 70 mg percent. I also encouraged her to eat a healthy diet and to be active and exercise and lose weight. She will return for follow-up in the future only as necessary and no routine scheduled follow-up appointment was made.

## 2016-05-01 ENCOUNTER — Other Ambulatory Visit: Payer: Self-pay | Admitting: Radiology

## 2016-05-01 NOTE — Progress Notes (Signed)
Orders in for  Orencia CBC CMP Tylenol and Benadryl

## 2016-05-05 ENCOUNTER — Encounter: Payer: Self-pay | Admitting: Internal Medicine

## 2016-05-05 ENCOUNTER — Ambulatory Visit (INDEPENDENT_AMBULATORY_CARE_PROVIDER_SITE_OTHER): Payer: Medicare Other | Admitting: Internal Medicine

## 2016-05-05 ENCOUNTER — Ambulatory Visit: Payer: Medicare Other

## 2016-05-05 VITALS — BP 144/82 | HR 78 | Ht 64.0 in | Wt 193.0 lb

## 2016-05-05 DIAGNOSIS — I639 Cerebral infarction, unspecified: Secondary | ICD-10-CM

## 2016-05-05 DIAGNOSIS — R911 Solitary pulmonary nodule: Secondary | ICD-10-CM

## 2016-05-05 DIAGNOSIS — Z79899 Other long term (current) drug therapy: Secondary | ICD-10-CM

## 2016-05-05 NOTE — Progress Notes (Signed)
Subjective:     Patient ID: [name], female   DOB: 1940/10/20, 76 y.o.   MRN: [id number]  HPI  PCP Julia Coma, MD   HPI   IOV 11/02/2015  Chief Complaint  Patient presents with  . Pulmonary Consult    Pt referred by [name]. Pt c/o DOE and occassional prod cough with grey to yellow mucus and chest tightness when SOB - resolves with rest.    76 year old female with significant rheumatoid arthritis referred by [name] evaluate pulmonary nodule and safety for T-cell Costim relation blocker Orencia which is known to increased risk for infections and also COPD exacerbations. She has past medical history of cardiac murmur not otherwise specified, chronic kidney disease stage III With a baseline creatinine of 1.16/GFR 52, chronic diastolic heart failure, hypothyroidism, hypertension, diabetes and long-standing rheumatoid arthritis of her joints. She also has obesity. She says she's been on Enbrel and methotrexate in the past N/A that she was intolerant of this has had side effects. In the last 5 or 6 months she's been started on Orencia. She missed the second dose or so because of gum infection. She's not had it consistently for the last 4 months or so. As a once monthly injection. She does not think it is helping her hand joints arthralgia. She is scheduled for infusion today 11/02/2015.   She says during a routine visit on 10/18/2015 to rheumatology she had sweats and chest pain, cough wheezing. She was then taken emergently to [facility] where she was hospitalized for 3 days. She tells me the diagnosis was one of congestive heart failure but in review of the chart it appears that she went an unremarkable echocardiogram [specifically it was reported that she had normal diastolic dysfunction and normal pulmonary artery pressures but with a mild aortic stenosis] and unremarkable Lexiscan stress test [cardiac cath in 2015 August was normal coronary anatomy]. She  underwent CT scan of the chest that I personally visualized and she has a very small subpleural nodule solitary which is new compared to one year ago but a previous nodule that was that had disappeared. Lung parenchyma  Is otherwise unremarkable except for some lower lobe bronchiectasis that is mild Of note she did have some acute kidney injury during this admission and she was hydrated [was supposed to diuresis] and her kidney injury improving. Her Lasix was held.  She says that she also had a similar episode of admission one year ago. Review of the chart shows this was in August 2016 and the principal problem at that time was hypoglycemia and acute renal failure which is prerenal in etiology  Between these 2 episodes she only had mild exertional fatigue and shortness of breath which she attributes to obesity. But since the August 2017 admission she says that her chest pain chest tightness cough and wheezing have all improved but she still not at baseline. Exhaled nitric oxide today 11/02/2015 and 16 ppb and normal and not consistent with asthma.   IMPRESSION: 1. Bronchiectasis. Mild increased scarring or atelectasis in the left lung responsible for the patchy opacity on radiographs today. 2. There is a tiny left upper lobe subpleural nodule which is new since February and is likely inflammatory. The 2-3 mm right lung nodule seen in February has resolved. No other acute lung finding. No follow-up needed if patient is low-risk. Non-contrast chest CT can be considered in 12 months if patient is high-risk. This recommendation follows the consensus statement: Guidelines for Management  of Incidental Pulmonary Nodules Detected on CT Images: From the Fleischner Society 2017; Radiology 2017; 284:228-243. 3. Calcified coronary artery and to a lesser extent aortic atherosclerosis.   Electronically Signed   By: [name] M.D.   On: 10/18/2015 17:39   OV 05/05/2016  Chief Complaint  Patient  presents with  . Follow-up    Pt has her CT chest today after OV. Pt states her breathing is unchanged since last OV. Pt c/o dry cough . Pt denies chest congestion, CP/tightness and f/c/s.    Follow-up lung nodule in the setting of rheumatoid arthritis and on the T Cell Costimn inhibitor [name]  Last visit October 2017 was a clearance for her to start this Orencia. Since then she is on it. It is helping her pain only somewhat. She is tolerating it just fine. There have been no respiratory exacerbations. She did have a nodule 2 mm left sided there was really small and given her immunosuppression we decided to do a CT scan in 6 months which would be today. However in the interim in February 2018 she did have a confusional episode and was admitted. CT angiogram chest ruled out pulmonary embolism but did show groundglass opacities. She and her review of the discharge summary does not describe any pneumonia symptoms. Currently she does not have any respiratory symptoms and is feeling well.     has a past medical history of Allergic rhinitis; Anemia; Aortic stenosis (12/22/2012); CHF (congestive heart failure) ([place]); Chronic pain; CKD (chronic kidney disease), stage III; Diabetes mellitus; Diverticulosis; DVT (deep venous thrombosis) ([place]); Glaucoma; Hemorrhoids; HTN (hypertension); Hypercholesteremia; Hyperlipidemia (12/22/2012); Hypothyroidism; Meningioma ([place]) (2008); Osteoarthritis; Peripheral neuropathy; PPD positive; PVC (premature ventricular contraction) (12/22/2012); Rheumatoid arthritis(714.0); Sickle cell trait ([place]); and Tuberculosis.   reports that she quit smoking about 54 years ago. Her smoking use included Cigarettes. She has a 0.20 pack-year smoking history. She has never used smokeless tobacco.  Past Surgical History:  Procedure Laterality Date  . ABDOMINAL HYSTERECTOMY  1978   TAH,& BSO 1  . fistula repair  03/2010   partial colectomy  . intracranial surgery  11/2007   removal of  meningioma  . JOINT REPLACEMENT  2001   left TKR  . LEFT HEART CATHETERIZATION WITH CORONARY ANGIOGRAM N/A 08/08/2013   Procedure: LEFT HEART CATHETERIZATION WITH CORONARY ANGIOGRAM;  Surgeon: Peter M Martinique, MD;  Location[facility] LAB;  Service: Cardiovascular;  Laterality: N/A;  . ROTATOR CUFF REPAIR  1999   BIlateral  . SIGMOIDECTOMY  2012  . TONSILLECTOMY AND ADENOIDECTOMY    . TOTAL THYROIDECTOMY  1992  . wrist sx     right     Allergies  Allergen Reactions  . Methotrexate Derivatives Other (See Comments)    Elevated creat. Levels  . Other Other (See Comments)    NO Blood products.  [name]- causes low blood sugar (pt states she was taking medication but cant remember what it was for.)  . Penicillins Rash    Has patient had a PCN reaction causing immediate rash, facial/tongue/throat swelling, SOB or lightheadedness with hypotension: Yes Has patient had a PCN reaction causing severe rash involving mucus membranes or skin necrosis: Yes  Has patient had a PCN reaction that required hospitalization No Has patient had a PCN reaction occurring within the last 10 years: No If all of the above answers are "NO", then may proceed with Cephalosporin use.   . Remicade [Infliximab] Other (See Comments)    Inadequate response   . Zocor [  Simvastatin] Other (See Comments)    Leg cramps     Immunization History  Administered Date(s) Administered  . Influenza-Unspecified 09/11/2015    Family History  Problem Relation Age of Onset  . Hypotension Mother   . CVA Mother   . Diabetes Mother   . Hypertension Father   . Kidney failure Father      Current Outpatient Prescriptions:  .  Abatacept (ORENCIA IV), Inject into the vein. Daughters are not sure of doses. Received in the short stay area once a month, Disp: , Rfl:  .  acetaminophen (TYLENOL) 500 MG tablet, Take 1,000 mg by mouth every 6 (six) hours as needed for mild pain., Disp: , Rfl:  .  allopurinol (ZYLOPRIM) 300 MG  tablet, Take 1 tablet (300 mg total) by mouth daily., Disp: 30 tablet, Rfl: 5 .  aspirin EC 81 MG EC tablet, Take 1 tablet (81 mg total) by mouth daily., Disp: 30 tablet, Rfl: 0 .  atorvastatin (LIPITOR) 40 MG tablet, Take 40 mg by mouth daily at 6 PM. , Disp: , Rfl:  .  BD INSULIN SYRINGE ULTRAFINE 31G X 15/64" 0.3 ML MISC, , Disp: , Rfl:  .  cholecalciferol (VITAMIN D) 1000 UNITS tablet, Take 1,000 Units by mouth daily. , Disp: , Rfl:  .  cyclobenzaprine (FLEXERIL) 10 MG tablet, Take 10 mg by mouth at bedtime. , Disp: , Rfl:  .  diclofenac sodium (VOLTAREN) 1 % GEL, 3 g to 3 larges joints up to three times daily (Patient taking differently: Apply 2 g topically 3 (three) times daily. 3 g to 3 larges joints up to three times daily), Disp: 1 Tube, Rfl: 3 .  ferrous sulfate (FEOSOL) 325 (65 FE) MG tablet, Take 325-650 mg by mouth daily with breakfast., Disp: , Rfl:  .  folic acid (FOLVITE) 1 MG tablet, Take 1 mg by mouth daily., Disp: , Rfl:  .  insulin NPH-regular Human (NOVOLIN 70/30) (70-30) 100 UNIT/ML injection, Inject 6-8 Units into the skin 3 (three) times daily with meals. Sliding source, Disp: , Rfl:  .  levothyroxine (SYNTHROID, LEVOTHROID) 150 MCG tablet, Take 1 tablet (150 mcg total) by mouth daily before breakfast. (Patient taking differently: Take 175 mcg by mouth daily before breakfast. ), Disp: , Rfl:  .  losartan (COZAAR) 50 MG tablet, Take 50 mg by mouth every evening., Disp: , Rfl:  .  multivitamin (THERAGRAN) per tablet, Take 1 tablet by mouth daily. , Disp: , Rfl:  .  pregabalin (LYRICA) 75 MG capsule, 1 pill in AM  &  2 at night, Disp: 84 capsule, Rfl: 0 .  VITAMIN E PO, Take 1 tablet by mouth daily., Disp: , Rfl:   Current Facility-Administered Medications:  .  triamcinolone acetonide (KENALOG) 10 MG/ML injection 10 mg, 10 mg, Other, Once, Landis Martins, DPM   Review of Systems     Objective:   Physical Exam  Constitutional: She is oriented to person, place, and time.  She appears well-developed and well-nourished. No distress.  oibese  HENT:  Head: Normocephalic and atraumatic.  Right Ear: External ear normal.  Left Ear: External ear normal.  Mouth/Throat: Oropharynx is clear and moist. No oropharyngeal exudate.  Eyes: Conjunctivae and EOM are normal. Pupils are equal, round, and reactive to light. Right eye exhibits no discharge. Left eye exhibits no discharge. No scleral icterus.  Neck: Normal range of motion. Neck supple. No JVD present. No tracheal deviation present. No thyromegaly present.  Cardiovascular: Normal rate, regular rhythm,  normal heart sounds and intact distal pulses.  Exam reveals no gallop and no friction rub.   No murmur heard. Pulmonary/Chest: Effort normal and breath sounds normal. No respiratory distress. She has no wheezes. She has no rales. She exhibits no tenderness.  Abdominal: Soft. Bowel sounds are normal. She exhibits no distension and no mass. There is no tenderness. There is no rebound and no guarding.  Musculoskeletal: Normal range of motion. She exhibits no edema or tenderness.  Has cane  Lymphadenopathy:    She has no cervical adenopathy.  Neurological: She is alert and oriented to person, place, and time. She has normal reflexes. No cranial nerve deficit. She exhibits normal muscle tone. Coordination normal.  Skin: Skin is warm and dry. No rash noted. She is not diaphoretic. No erythema. No pallor.  Psychiatric: She has a normal mood and affect. Her behavior is normal. Judgment and thought content normal.  Vitals reviewed.  Vitals:   05/05/16 1221  BP: (!) 144/82  Pulse: 78  SpO2: 97%  Weight: 193 lb (87.5 kg)  Height: 5\' 4"  (1.626 m)    Estimated body mass index is 33.13 kg/m as calculated from the following:   Height as of this encounter: 5\' 4"  (1.626 m).   Weight as of this encounter: 193 lb (87.5 kg).     Assessment:       ICD-9-CM ICD-10-CM   1. Nodule of left lung 793.11 R91.1   2. High risk  medication use V58.69 Z79.899        Plan:         CT chest oct 2017 had small nodule CT chest feb 2018 did not describe this nodule but reports mild lung inflammation Because you feel well now from a breathing stand point, recommend  - cancel today CT chest  - instead do CT chest wo contrast in 3 months from 05/05/2016   Return for followup in 3 months but after CT chest   Julia. Brand Males, M.D., [facility].C.P Pulmonary and Critical Care Medicine Staff Physician [facility] Pager: [phone], If no answer or between  15:00h - 7:00h: call [phone]  05/05/2016 12:41 PM

## 2016-05-05 NOTE — Patient Instructions (Addendum)
ICD-9-CM ICD-10-CM   1. Nodule of left lung 793.11 R91.1   2. High risk medication use V58.69 Z79.899     CT chest oct 2017 had small nodule CT chest feb 2018 did not describe this nodule but reports mild lung inflammation Because you feel well now from a breathing stand point, recommend  - cancel today CT chest  - instead do CT chest wo contrast in 3 months from 05/05/2016   Return for followup in 3 months but after CT chest

## 2016-05-07 DIAGNOSIS — E039 Hypothyroidism, unspecified: Secondary | ICD-10-CM | POA: Diagnosis not present

## 2016-05-12 ENCOUNTER — Ambulatory Visit (HOSPITAL_COMMUNITY)
Admission: RE | Admit: 2016-05-12 | Discharge: 2016-05-12 | Disposition: A | Discharge status: Home / Self Care | Payer: Medicare Other | Source: Ambulatory Visit | Attending: Rheumatology | Admitting: Rheumatology

## 2016-05-12 DIAGNOSIS — M0579 Rheumatoid arthritis with rheumatoid factor of multiple sites without organ or systems involvement: Secondary | ICD-10-CM | POA: Insufficient documentation

## 2016-05-12 MED ORDER — DIPHENHYDRAMINE HCL 25 MG PO CAPS
ORAL_CAPSULE | ORAL | Status: AC
  Filled 2016-05-12: qty 1

## 2016-05-12 MED ORDER — ACETAMINOPHEN 325 MG PO TABS
ORAL_TABLET | ORAL | Status: AC
  Administered 2016-05-12: 13:00:00 650 mg via ORAL
  Filled 2016-05-12: qty 2

## 2016-05-12 MED ORDER — ACETAMINOPHEN 325 MG PO TABS
650.0000 mg | ORAL_TABLET | ORAL | Status: DC
  Administered 2016-05-12: 650 mg via ORAL

## 2016-05-12 MED ORDER — SODIUM CHLORIDE 0.9 % IV SOLN
750.0000 mg | INTRAVENOUS | Status: AC
  Administered 2016-05-12: 750 mg via INTRAVENOUS
  Filled 2016-05-12: qty 30

## 2016-05-12 MED ORDER — DIPHENHYDRAMINE HCL 25 MG PO CAPS
25.0000 mg | ORAL_CAPSULE | ORAL | Status: DC
  Administered 2016-05-12: 25 mg via ORAL

## 2016-05-20 ENCOUNTER — Ambulatory Visit: Payer: Medicare Other | Admitting: Sports Medicine

## 2016-05-20 DIAGNOSIS — Z01411 Encounter for gynecological examination (general) (routine) with abnormal findings: Secondary | ICD-10-CM | POA: Diagnosis not present

## 2016-05-20 DIAGNOSIS — B3789 Other sites of candidiasis: Secondary | ICD-10-CM | POA: Diagnosis not present

## 2016-05-29 DIAGNOSIS — G894 Chronic pain syndrome: Secondary | ICD-10-CM | POA: Diagnosis not present

## 2016-05-29 DIAGNOSIS — B356 Tinea cruris: Secondary | ICD-10-CM | POA: Diagnosis not present

## 2016-05-29 DIAGNOSIS — J309 Allergic rhinitis, unspecified: Secondary | ICD-10-CM | POA: Diagnosis not present

## 2016-06-03 ENCOUNTER — Ambulatory Visit (INDEPENDENT_AMBULATORY_CARE_PROVIDER_SITE_OTHER): Payer: Medicare Other | Admitting: Sports Medicine

## 2016-06-03 ENCOUNTER — Encounter: Payer: Self-pay | Admitting: Sports Medicine

## 2016-06-03 DIAGNOSIS — L84 Corns and callosities: Secondary | ICD-10-CM | POA: Diagnosis not present

## 2016-06-03 DIAGNOSIS — E1140 Type 2 diabetes mellitus with diabetic neuropathy, unspecified: Secondary | ICD-10-CM

## 2016-06-03 DIAGNOSIS — M19071 Primary osteoarthritis, right ankle and foot: Secondary | ICD-10-CM

## 2016-06-03 DIAGNOSIS — Z794 Long term (current) use of insulin: Secondary | ICD-10-CM

## 2016-06-03 DIAGNOSIS — M19072 Primary osteoarthritis, left ankle and foot: Secondary | ICD-10-CM

## 2016-06-03 DIAGNOSIS — M79676 Pain in unspecified toe(s): Secondary | ICD-10-CM

## 2016-06-03 DIAGNOSIS — M2040 Other hammer toe(s) (acquired), unspecified foot: Secondary | ICD-10-CM

## 2016-06-03 NOTE — Progress Notes (Signed)
Patient ID: [name], female   DOB: 10/09/1940, 76 y.o.   MRN: [id number]  Subjective: [name] is a 76 y.o. female patient with history of type 2 diabetes who returns to office today complaining of painful callus while ambulating in shoes; unable to trim. Patient states that the glucose reading this morning was not recorded but good. Patient admits new pain in knees. Patient denies any new cramping, numbness, burning or tingling in the legs.  Patient Active Problem List   Diagnosis Date Noted  . 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 Elevated d-dimer   . Encephalopathy   . Sinus bradycardia   . Acute encephalopathy 02/27/2016  . Seropositive rheumatoid arthritis of multiple sites ([place]) 01/17/2016  . High risk medications (not anticoagulants) long-term use 01/17/2016  . Bilateral hand pain 01/17/2016  . Bilateral wrist pain 01/17/2016  . Pain in right elbow 01/17/2016  . Left shoulder pain 01/17/2016  . Encounter for therapeutic drug monitoring 11/02/2015  . Bronchiectasis without complication ([place]) 64/40/3474  . Chronic gout involving toe without tophus 10/30/2015  . High risk medication use 10/29/2015  . Gout 10/29/2015  . H/O total knee replacement, left 10/29/2015  . History of positive PPD 10/29/2015  . Diabetes mellitus, type II, insulin dependent ([place]) 10/18/2015  . Rheumatoid arthritis flare ([place]) 10/18/2015  . AKI (acute kidney injury) ([place]) 10/18/2015  . CKD (chronic kidney disease) stage 3, GFR 30-59 ml/min 10/18/2015  . Nodule of left lung 10/18/2015  . Neuropathy 08/22/2014  . Hypothyroidism 08/22/2014  . Chronic pain 08/22/2014  . Chronic congestive heart failure with left ventricular diastolic dysfunction ([place]) 08/08/2013  . Chest pain 08/05/2013  . Aortic stenosis 12/22/2012  . Hyperlipidemia 12/22/2012  . PVC (premature ventricular contraction) 12/22/2012  . HTN (hypertension) 12/22/2012  . Stokes-Adams syncope 12/22/2012   . Open angle with borderline findings, high risk 12/15/2011  . Cataract, nuclear 05/25/2011   Current Outpatient Prescriptions on File Prior to Visit  Medication Sig Dispense Refill  . Abatacept (ORENCIA IV) Inject into the vein. Daughters are not sure of doses. Received in the short stay area once a month    . acetaminophen (TYLENOL) 500 MG tablet Take 1,000 mg by mouth every 6 (six) hours as needed for mild pain.    Marland Kitchen allopurinol (ZYLOPRIM) 300 MG tablet Take 1 tablet (300 mg total) by mouth daily. 30 tablet 5  . aspirin EC 81 MG EC tablet Take 1 tablet (81 mg total) by mouth daily. 30 tablet 0  . atorvastatin (LIPITOR) 40 MG tablet Take 40 mg by mouth daily at 6 PM.     . BD INSULIN SYRINGE ULTRAFINE 31G X 15/64" 0.3 ML MISC     . cholecalciferol (VITAMIN D) 1000 UNITS tablet Take 1,000 Units by mouth daily.     . cyclobenzaprine (FLEXERIL) 10 MG tablet Take 10 mg by mouth at bedtime.     . diclofenac sodium (VOLTAREN) 1 % GEL 3 g to 3 larges joints up to three times daily (Patient taking differently: Apply 2 g topically 3 (three) times daily. 3 g to 3 larges joints up to three times daily) 1 Tube 3  . ferrous sulfate (FEOSOL) 325 (65 FE) MG tablet Take 325-650 mg by mouth daily with breakfast.    . folic acid (FOLVITE) 1 MG tablet Take 1 mg by mouth daily.    . insulin NPH-regular Human (NOVOLIN 70/30) (70-30) 100 UNIT/ML injection Inject 6-8 Units into the skin 3 (three) times  daily with meals. Sliding source    . levothyroxine (SYNTHROID, LEVOTHROID) 150 MCG tablet Take 1 tablet (150 mcg total) by mouth daily before breakfast. (Patient taking differently: Take 175 mcg by mouth daily before breakfast. )    . losartan (COZAAR) 50 MG tablet Take 50 mg by mouth every evening.    . multivitamin (THERAGRAN) per tablet Take 1 tablet by mouth daily.     . pregabalin (LYRICA) 75 MG capsule 1 pill in AM  &  2 at night 84 capsule 0  . VITAMIN E PO Take 1 tablet by mouth daily.     Current  Facility-Administered Medications on File Prior to Visit  Medication Dose Route Frequency Provider Last Rate Last Dose  . triamcinolone acetonide (KENALOG) 10 MG/ML injection 10 mg  10 mg Other Once Landis Martins, DPM       Allergies  Allergen Reactions  . Methotrexate Derivatives Other (See Comments)    Elevated creat. Levels  . Other Other (See Comments)    NO Blood products.  [name]- causes low blood sugar (pt states she was taking medication but cant remember what it was for.)  . Penicillins Rash    Has patient had a PCN reaction causing immediate rash, facial/tongue/throat swelling, SOB or lightheadedness with hypotension: Yes Has patient had a PCN reaction causing severe rash involving mucus membranes or skin necrosis: Yes  Has patient had a PCN reaction that required hospitalization No Has patient had a PCN reaction occurring within the last 10 years: No If all of the above answers are "NO", then may proceed with Cephalosporin use.   . Remicade [Infliximab] Other (See Comments)    Inadequate response   . Zocor [Simvastatin] Other (See Comments)    Leg cramps      Objective: General: Patient is awake, alert, and oriented x 3 and in no acute distress.  Integument: Skin is warm, dry and supple bilateral. Nails are short, thickened and dystrophic with subungual debris, consistent with onychomycosis, 1-5 bilateral. No open lesions. + callus/ preulcerative lesions present left plantar forefoot and at left 2nd toe and left>right 4th toe distal tuft (mostly involved), with no signs of infection. Remaining integument unremarkable.  Vasculature:  Dorsalis Pedis pulse 1/4 bilateral. Posterior Tibial pulse  1/4 bilateral. Capillary fill time <3 sec 1-5 bilateral. Scant hair growth to the level of the digits.Temperature gradient within normal limits. No varicosities present bilateral. Trace edema present bilateral, greatest at Right ankle.   Neurology: The patient has diminished  sensation measured with a 5.07/10g Semmes Weinstein Monofilament at all pedal sites bilateral . Vibratory sensation diminished bilateral with tuning fork. No Babinski sign present bilateral.   Musculoskeletal: Pain to toes at areas of keratosis L>R.Bunion and hammertoe pedal deformities noted bilateral. Pes planus foot type. Muscular strength 5/5 in all lower extremity muscular groups bilateral without pain on range of motion except right ankle. No tenderness with calf compression bilateral.  Xray right ankle- alvusion fracture at medial ankle with arthritic changes. No other acute findings. Hardware from previous foot surgery intact.    Assessment and Plan: Problem List Items Addressed This Visit    None    Visit Diagnoses    Corns and callosities    -  Primary   Hammer toe, unspecified laterality       Type 2 diabetes mellitus with diabetic neuropathy, with long-term current use of insulin (HCC)       Pain of toe, unspecified laterality  Arthritis of foot, left       Osteoarthritis of ankle and foot, right         -Examined patient. -Discussed and educated patient on diabetic foot care, especially with  regards to the vascular, neurological and musculoskeletal systems.  -Stressed the importance of good glycemic control and the detriment of not  controlling glucose levels in relation to the foot. -Debrided callus at right and left 4th toe and left plantar forefoot using sterile chisel blade without incident. -Gave toe cap for left 2nd toe and dispensed toe crest pain for 4th toes -Encouraged patient if continues to be bothersome to consider hammertoe surgery -Continue with cane on right -Patient to continue with diabetic shoes -Patient to return in 3 weeks to signs surgery consent for hammertoes  -Patient advised to call the office if any problems or questions arise in the meantime.  Landis Martins, DPM

## 2016-06-06 ENCOUNTER — Telehealth: Payer: Self-pay | Admitting: Rheumatology

## 2016-06-06 NOTE — Telephone Encounter (Signed)
Cyril Mourning (I believe, she was talking fast) left a message stating that this patient is going in on Monday, June 4 for an IV infusion of Orencia.  They are needing the orders put in Epic.  Thank you.

## 2016-06-09 ENCOUNTER — Other Ambulatory Visit: Payer: Self-pay | Admitting: Radiology

## 2016-06-09 ENCOUNTER — Ambulatory Visit (HOSPITAL_COMMUNITY)
Admission: RE | Admit: 2016-06-09 | Discharge: 2016-06-09 | Disposition: A | Discharge status: Home / Self Care | Payer: Medicare Other | Source: Ambulatory Visit | Attending: Rheumatology | Admitting: Rheumatology

## 2016-06-09 DIAGNOSIS — M0579 Rheumatoid arthritis with rheumatoid factor of multiple sites without organ or systems involvement: Secondary | ICD-10-CM

## 2016-06-09 LAB — CBC WITH DIFFERENTIAL/PLATELET
BASOS PCT: 1 %
Basophils Absolute: 0.1 10*3/uL (ref 0.0–0.1)
EOS ABS: 0.3 10*3/uL (ref 0.0–0.7)
Eosinophils Relative: 4 %
HCT: 30.4 % — ABNORMAL LOW (ref 36.0–46.0)
HEMOGLOBIN: 10 g/dL — AB (ref 12.0–15.0)
LYMPHS ABS: 3.7 10*3/uL (ref 0.7–4.0)
Lymphocytes Relative: 41 %
MCH: 28.7 pg (ref 26.0–34.0)
MCHC: 32.9 g/dL (ref 30.0–36.0)
MCV: 87.1 fL (ref 78.0–100.0)
Monocytes Absolute: 0.4 10*3/uL (ref 0.1–1.0)
Monocytes Relative: 5 %
NEUTROS PCT: 49 %
Neutro Abs: 4.7 10*3/uL (ref 1.7–7.7)
Platelets: 289 10*3/uL (ref 150–400)
RBC: 3.49 MIL/uL — AB (ref 3.87–5.11)
RDW: 15.5 % (ref 11.5–15.5)
WBC: 9.2 10*3/uL (ref 4.0–10.5)

## 2016-06-09 LAB — COMPREHENSIVE METABOLIC PANEL
ALBUMIN: 3.7 g/dL (ref 3.5–5.0)
ALK PHOS: 91 U/L (ref 38–126)
ALT: 13 U/L — AB (ref 14–54)
AST: 25 U/L (ref 15–41)
Anion gap: 9 (ref 5–15)
BUN: 14 mg/dL (ref 6–20)
CALCIUM: 9.2 mg/dL (ref 8.9–10.3)
CO2: 23 mmol/L (ref 22–32)
CREATININE: 1.64 mg/dL — AB (ref 0.44–1.00)
Chloride: 108 mmol/L (ref 101–111)
GFR calc Af Amer: 34 mL/min — ABNORMAL LOW (ref >60)
GFR calc non Af Amer: 29 mL/min — ABNORMAL LOW (ref >60)
GLUCOSE: 97 mg/dL (ref 65–99)
Potassium: 4.6 mmol/L (ref 3.5–5.1)
SODIUM: 140 mmol/L (ref 135–145)
Total Bilirubin: 0.7 mg/dL (ref 0.3–1.2)
Total Protein: 7 g/dL (ref 6.5–8.1)

## 2016-06-09 MED ORDER — SODIUM CHLORIDE 0.9 % IV SOLN
1000.0000 mg | INTRAVENOUS | Status: DC
  Administered 2016-06-09: 1000 mg via INTRAVENOUS
  Filled 2016-06-09: qty 40

## 2016-06-09 MED ORDER — DIPHENHYDRAMINE HCL 25 MG PO CAPS
ORAL_CAPSULE | ORAL | Status: AC
  Administered 2016-06-09: 25 mg via ORAL
  Filled 2016-06-09: qty 1

## 2016-06-09 MED ORDER — DIPHENHYDRAMINE HCL 25 MG PO CAPS
25.0000 mg | ORAL_CAPSULE | ORAL | Status: DC
  Administered 2016-06-09: 25 mg via ORAL

## 2016-06-09 NOTE — Progress Notes (Signed)
Prob due to Losartan. Please reduce Lyrica to 75 mg bid only. Fax results to her PCP.

## 2016-06-09 NOTE — Progress Notes (Signed)
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

## 2016-06-11 DIAGNOSIS — E1142 Type 2 diabetes mellitus with diabetic polyneuropathy: Secondary | ICD-10-CM | POA: Diagnosis not present

## 2016-06-11 DIAGNOSIS — Z6834 Body mass index (BMI) 34.0-34.9, adult: Secondary | ICD-10-CM | POA: Diagnosis not present

## 2016-06-11 DIAGNOSIS — Z794 Long term (current) use of insulin: Secondary | ICD-10-CM | POA: Diagnosis not present

## 2016-06-11 DIAGNOSIS — E039 Hypothyroidism, unspecified: Secondary | ICD-10-CM | POA: Diagnosis not present

## 2016-06-13 ENCOUNTER — Telehealth: Payer: Self-pay | Admitting: Radiology

## 2016-06-13 DIAGNOSIS — E1142 Type 2 diabetes mellitus with diabetic polyneuropathy: Secondary | ICD-10-CM | POA: Diagnosis not present

## 2016-06-13 DIAGNOSIS — Z794 Long term (current) use of insulin: Secondary | ICD-10-CM | POA: Diagnosis not present

## 2016-06-13 NOTE — Telephone Encounter (Signed)
-----   Message from Bo Merino, MD sent at 06/09/2016  3:25 PM EDT ----- Prob due to Losartan. Please reduce Lyrica to 75 mg bid only. Fax results to her PCP.

## 2016-06-13 NOTE — Telephone Encounter (Signed)
I have called patient to advise. She has voiced understanding

## 2016-06-13 NOTE — Telephone Encounter (Signed)
Called to advise of low GFR elevated Creat,routed to PCP /Left message for her to call me back

## 2016-06-24 ENCOUNTER — Telehealth: Payer: Self-pay | Admitting: *Deleted

## 2016-06-24 ENCOUNTER — Ambulatory Visit: Payer: Medicare Other | Admitting: Rheumatology

## 2016-06-24 ENCOUNTER — Ambulatory Visit (INDEPENDENT_AMBULATORY_CARE_PROVIDER_SITE_OTHER): Payer: Medicare Other | Admitting: Sports Medicine

## 2016-06-24 ENCOUNTER — Encounter: Payer: Self-pay | Admitting: Sports Medicine

## 2016-06-24 DIAGNOSIS — L84 Corns and callosities: Secondary | ICD-10-CM | POA: Diagnosis not present

## 2016-06-24 DIAGNOSIS — Z794 Long term (current) use of insulin: Secondary | ICD-10-CM

## 2016-06-24 DIAGNOSIS — M779 Enthesopathy, unspecified: Principal | ICD-10-CM

## 2016-06-24 DIAGNOSIS — M7752 Other enthesopathy of left foot: Secondary | ICD-10-CM

## 2016-06-24 DIAGNOSIS — M79676 Pain in unspecified toe(s): Secondary | ICD-10-CM

## 2016-06-24 DIAGNOSIS — E1140 Type 2 diabetes mellitus with diabetic neuropathy, unspecified: Secondary | ICD-10-CM

## 2016-06-24 DIAGNOSIS — Q828 Other specified congenital malformations of skin: Secondary | ICD-10-CM

## 2016-06-24 DIAGNOSIS — M2040 Other hammer toe(s) (acquired), unspecified foot: Secondary | ICD-10-CM

## 2016-06-24 DIAGNOSIS — M778 Other enthesopathies, not elsewhere classified: Secondary | ICD-10-CM

## 2016-06-24 MED ORDER — TRIAMCINOLONE ACETONIDE 10 MG/ML IJ SUSP: 10.0000 mg | Freq: Once | INTRAMUSCULAR | Status: DC

## 2016-06-24 NOTE — Patient Instructions (Signed)
Pre-Operative Instructions  Congratulations, you have decided to take an important step to improving your quality of life.  You can be assured that the doctors of [facility] will be with you every step of the way.  1. Plan to be at the surgery center/hospital at least 1 (one) hour prior to your scheduled time unless otherwise directed by the surgical center/hospital staff.  You must have a responsible adult accompany you, remain during the surgery and drive you home.  Make sure you have directions to the surgical center/hospital and know how to get there on time. 2. For hospital based surgery you will need to obtain a history and physical form from your family physician within 1 month prior to the date of surgery- we will give you a form for you primary physician.  3. We make every effort to accommodate the date you request for surgery.  There are however, times where surgery dates or times have to be moved.  We will contact you as soon as possible if a change in schedule is required.   4. No Aspirin/Ibuprofen for one week before surgery.  If you are on aspirin, any non-steroidal anti-inflammatory medications (Mobic, Aleve, Ibuprofen) you should stop taking it 7 days prior to your surgery.  You make take Tylenol  For pain prior to surgery.  5. Medications- If you are taking daily heart and blood pressure medications, seizure, reflux, allergy, asthma, anxiety, pain or diabetes medications, make sure the surgery center/hospital is aware before the day of surgery so they may notify you which medications to take or avoid the day of surgery. 6. No food or drink after midnight the night before surgery unless directed otherwise by surgical center/hospital staff. 7. No alcoholic beverages 24 hours prior to surgery.  No smoking 24 hours prior to or 24 hours after surgery. 8. Wear loose pants or shorts- loose enough to fit over bandages, boots, and casts. 9. No slip on shoes, sneakers are best. 10. Bring  your boot with you to the surgery center/hospital.  Also bring crutches or a walker if your physician has prescribed it for you.  If you do not have this equipment, it will be provided for you after surgery. 11. If you have not been contracted by the surgery center/hospital by the day before your surgery, call to confirm the date and time of your surgery. 12. Leave-time from work may vary depending on the type of surgery you have.  Appropriate arrangements should be made prior to surgery with your employer. 13. Prescriptions will be provided immediately following surgery by your doctor.  Have these filled as soon as possible after surgery and take the medication as directed. 14. Remove nail polish on the operative foot. 15. Wash the night before surgery.  The night before surgery wash the foot and leg well with the antibacterial soap provided and water paying special attention to beneath the toenails and in between the toes.  Rinse thoroughly with water and dry well with a towel.  Perform this wash unless told not to do so by your physician.  Enclosed: 1 Ice pack (please put in freezer the night before surgery)   1 Hibiclens skin cleaner   Pre-op Instructions  If you have any questions regarding the instructions, do not hesitate to call our office.  [place]: [address] [place], [place] [address] [phone]  [place]: [address] [place], [place] [address] [phone]  [place]: [address]  [place], [place] [address] [phone]   Dr.   Norman Regal DPM, Dr. Matthew Wagoner DPM, Dr. M. Todd Hyatt DPM, Dr. Ellasyn Swilling DPM 

## 2016-06-24 NOTE — Telephone Encounter (Signed)
I left patient a message to call me to reschedule her appointment for July 2.  There's a conflict in scheduling and patient also has a rheumatoid appointment that day for treatment.

## 2016-06-24 NOTE — Progress Notes (Signed)
Patient ID: [name], female   DOB: [birth year], 76 y.o.   MRN: [id number]  Subjective: [name] is a 76 y.o. female patient with history of type 2 diabetes who returns to office today complaining of painful hammertoe and callus while ambulating in shoes; unable to trim. Patient states that the glucose reading this morning was not recorded but good. Patient is here to discuss surgery; Patient denies any new cramping, numbness, burning or tingling in the legs, unchaged knee pain.  Patient Active Problem List   Diagnosis Date Noted  . 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 Elevated d-dimer   . Encephalopathy   . Sinus bradycardia   . Acute encephalopathy 02/27/2016  . Seropositive rheumatoid arthritis of multiple sites ([place]) 01/17/2016  . High risk medications (not anticoagulants) long-term use 01/17/2016  . Bilateral hand pain 01/17/2016  . Bilateral wrist pain 01/17/2016  . Pain in right elbow 01/17/2016  . Left shoulder pain 01/17/2016  . Encounter for therapeutic drug monitoring 11/02/2015  . Bronchiectasis without complication ([place]) 14/97/0263  . Chronic gout involving toe without tophus 10/30/2015  . High risk medication use 10/29/2015  . Gout 10/29/2015  . H/O total knee replacement, left 10/29/2015  . History of positive PPD 10/29/2015  . Diabetes mellitus, type II, insulin dependent ([place]) 10/18/2015  . Rheumatoid arthritis flare ([place]) 10/18/2015  . AKI (acute kidney injury) ([place]) 10/18/2015  . CKD (chronic kidney disease) stage 3, GFR 30-59 ml/min 10/18/2015  . Nodule of left lung 10/18/2015  . Neuropathy 08/22/2014  . Hypothyroidism 08/22/2014  . Chronic pain 08/22/2014  . 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 Chest pain 08/05/2013  . Aortic stenosis 12/22/2012  . Hyperlipidemia 12/22/2012  . PVC (premature ventricular contraction) 12/22/2012  . HTN (hypertension) 12/22/2012   . 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 Open angle with borderline findings, high risk 12/15/2011  . Cataract, nuclear 05/25/2011   Current Outpatient Prescriptions on File Prior to Visit  Medication Sig Dispense Refill  . Abatacept (ORENCIA IV) Inject into the vein. Daughters are not sure of doses. Received in the short stay area once a month    . acetaminophen (TYLENOL) 500 MG tablet Take 1,000 mg by mouth every 6 (six) hours as needed for mild pain.    Marland Kitchen allopurinol (ZYLOPRIM) 300 MG tablet Take 1 tablet (300 mg total) by mouth daily. 30 tablet 5  . aspirin EC 81 MG EC tablet Take 1 tablet (81 mg total) by mouth daily. 30 tablet 0  . atorvastatin (LIPITOR) 40 MG tablet Take 40 mg by mouth daily at 6 PM.     . BD INSULIN SYRINGE ULTRAFINE 31G X 15/64" 0.3 ML MISC     . cholecalciferol (VITAMIN D) 1000 UNITS tablet Take 1,000 Units by mouth daily.     . cyclobenzaprine (FLEXERIL) 10 MG tablet Take 10 mg by mouth at bedtime.     . diclofenac sodium (VOLTAREN) 1 % GEL 3 g to 3 larges joints up to three times daily (Patient taking differently: Apply 2 g topically 3 (three) times daily. 3 g to 3 larges joints up to three times daily) 1 Tube 3  . ferrous sulfate (FEOSOL) 325 (65 FE) MG tablet Take 325-650 mg by mouth daily with breakfast.    . folic acid (FOLVITE) 1 MG tablet Take 1 mg by mouth daily.    . insulin NPH-regular Human (NOVOLIN 70/30) (70-30) 100 UNIT/ML injection Inject 6-8 Units into  the skin 3 (three) times daily with meals. Sliding source    . levothyroxine (SYNTHROID, LEVOTHROID) 150 MCG tablet Take 1 tablet (150 mcg total) by mouth daily before breakfast. (Patient taking differently: Take 175 mcg by mouth daily before breakfast. )    . losartan (COZAAR) 50 MG tablet Take 50 mg by mouth every evening.    . multivitamin (THERAGRAN) per tablet Take 1 tablet by mouth daily.     . pregabalin (LYRICA) 75 MG capsule 1 pill in AM  &  2 at night 84 capsule 0  . VITAMIN E PO Take 1 tablet  by mouth daily.     Current Facility-Administered Medications on File Prior to Visit  Medication Dose Route Frequency Provider Last Rate Last Dose  . triamcinolone acetonide (KENALOG) 10 MG/ML injection 10 mg  10 mg Other Once Landis Martins, DPM       Allergies  Allergen Reactions  . Methotrexate Derivatives Other (See Comments)    Elevated creat. Levels  . Other Other (See Comments)    NO Blood products.  [name]- causes low blood sugar (pt states she was taking medication but cant remember what it was for.)  . Penicillins Rash    Has patient had a PCN reaction causing immediate rash, facial/tongue/throat swelling, SOB or lightheadedness with hypotension: Yes Has patient had a PCN reaction causing severe rash involving mucus membranes or skin necrosis: Yes  Has patient had a PCN reaction that required hospitalization No Has patient had a PCN reaction occurring within the last 10 years: No If all of the above answers are "NO", then may proceed with Cephalosporin use.   . Remicade [Infliximab] Other (See Comments)    Inadequate response   . Zocor [Simvastatin] Other (See Comments)    Leg cramps      Objective: General: Patient is awake, alert, and oriented x 3 and in no acute distress.  Integument: Skin is warm, dry and supple bilateral. Nails are short, thickened and dystrophic with subungual debris, consistent with onychomycosis, 1-5 bilateral. No open lesions. + callus/ preulcerative lesions present left plantar forefoot and at left 2nd toe and left>right 4th toe distal tuft (mostly involved), with no signs of infection. Remaining integument unremarkable.  Vasculature:  Dorsalis Pedis pulse 1/4 bilateral. Posterior Tibial pulse  1/4 bilateral. Capillary fill time <3 sec 1-5 bilateral. Scant hair growth to the level of the digits.Temperature gradient within normal limits. No varicosities present bilateral. Trace edema present bilateral, greatest at Right ankle.    Neurology: The patient has diminished sensation measured with a 5.07/10g Semmes Weinstein Monofilament at all pedal sites bilateral . Vibratory sensation diminished bilateral with tuning fork. No Babinski sign present bilateral.   Musculoskeletal: Pain to toes at areas of keratosis L>R.Bunion and hammertoe pedal deformities noted bilateral. Pes planus foot type. Muscular strength 5/5 in all lower extremity muscular groups bilateral. No tenderness with calf compression bilateral.    Assessment and Plan: Problem List Items Addressed This Visit    None    Visit Diagnoses    Capsulitis of foot, left    -  Primary   Relevant Medications   triamcinolone acetonide (KENALOG) 10 MG/ML injection 10 mg (Start on 06/24/2016  8:45 PM)   Hammer toe, unspecified laterality       Relevant Medications   triamcinolone acetonide (KENALOG) 10 MG/ML injection 10 mg (Start on 06/24/2016  8:45 PM)   Corns and callosities       Relevant Medications   triamcinolone acetonide (KENALOG)  10 MG/ML injection 10 mg (Start on 06/24/2016  8:45 PM)   Pain of toe, unspecified laterality       Type 2 diabetes mellitus with diabetic neuropathy, with long-term current use of insulin ([place])         -Examined patient. -Discussed and educated patient on diabetic foot care, especially with  regards to the vascular, neurological and musculoskeletal systems.  -Stressed the importance of good glycemic control and the detriment of not  controlling glucose levels in relation to the foot. -After oral consent and aseptic prep, injected a mixture containing 0.5 ml of 2%  plain lidocaine, 0.5 ml 0.5% plain marcaine, 0.5 ml of kenalog 10 and 0.5 ml of dexamethasone phosphate into left 4th toe without complication. Post-injection care discussed with patient.  -Debrided callus at right and left 4th toe using sterile chisel blade without incident. -Encouraged patient if continues to be bothersome to consider hammertoe surgery -Patient opt  for surgical management. Consent obtained for left 4th toe in office tenotomy. Pre and Post op course explained. Risks, benefits, alternatives explained. No guarantees given or implied. Surgical booking slip submitted and provided patient with Surgical packet and info for office -To dispense post op shoe at time of surgery -Will need medical clearance from PCP -Continue with cane on right -Patient to continue with diabetic shoes until time for in office surgery on left -Patient to return for in office surgery  -Patient advised to call the office if any problems or questions arise in the meantime.  Landis Martins, DPM

## 2016-06-26 NOTE — Telephone Encounter (Signed)
I am calling to make sure you got my message.  We moved your office surgery date  from July 2 to July 9.  I also want to remind you that you are scheduled for an infusion on July 2.  "I was waiting to see if that had been approved.  July 9 is fine with me.  I'll see you then."

## 2016-07-01 ENCOUNTER — Ambulatory Visit (INDEPENDENT_AMBULATORY_CARE_PROVIDER_SITE_OTHER): Payer: Medicare Other | Admitting: Rheumatology

## 2016-07-01 ENCOUNTER — Ambulatory Visit (INDEPENDENT_AMBULATORY_CARE_PROVIDER_SITE_OTHER): Payer: Self-pay

## 2016-07-01 ENCOUNTER — Ambulatory Visit: Payer: Medicare Other | Admitting: Rheumatology

## 2016-07-01 ENCOUNTER — Encounter: Payer: Self-pay | Admitting: Rheumatology

## 2016-07-01 ENCOUNTER — Other Ambulatory Visit: Payer: Self-pay | Admitting: Radiology

## 2016-07-01 VITALS — BP 127/73 | Resp 17 | Ht 64.0 in | Wt 188.0 lb

## 2016-07-01 DIAGNOSIS — I639 Cerebral infarction, unspecified: Secondary | ICD-10-CM

## 2016-07-01 DIAGNOSIS — M069 Rheumatoid arthritis, unspecified: Secondary | ICD-10-CM | POA: Diagnosis not present

## 2016-07-01 DIAGNOSIS — Z79899 Other long term (current) drug therapy: Secondary | ICD-10-CM

## 2016-07-01 DIAGNOSIS — D649 Anemia, unspecified: Secondary | ICD-10-CM

## 2016-07-01 DIAGNOSIS — M1A9XX0 Chronic gout, unspecified, without tophus (tophi): Secondary | ICD-10-CM

## 2016-07-01 DIAGNOSIS — M25552 Pain in left hip: Secondary | ICD-10-CM

## 2016-07-01 MED ORDER — PREGABALIN 75 MG PO CAPS: ORAL_CAPSULE | ORAL | 0 refills | Status: DC

## 2016-07-01 NOTE — Progress Notes (Signed)
Pharmacy Note  Subjective: Patient presents today to the [facility] to see Dr. Estanislado Pandy.  Patient seen by the pharmacist for counseling on leflunomide Jolee Ewing).    Objective: CBC    Component Value Date/Time   WBC 9.2 06/09/2016 1219   RBC 3.49 (L) 06/09/2016 1219   HGB 10.0 (L) 06/09/2016 1219   HGB 10.2 (L) 03/12/2010 1102   HCT 30.4 (L) 06/09/2016 1219   HCT 31.2 (L) 03/12/2010 1102   PLT 289 06/09/2016 1219   PLT 481 (H) 03/12/2010 1102   MCV 87.1 06/09/2016 1219   MCV 81.9 03/12/2010 1102   MCH 28.7 06/09/2016 1219   MCHC 32.9 06/09/2016 1219   RDW 15.5 06/09/2016 1219   RDW 18.9 (H) 03/12/2010 1102   LYMPHSABS 3.7 06/09/2016 1219   LYMPHSABS 2.5 03/12/2010 1102   MONOABS 0.4 06/09/2016 1219   MONOABS 0.4 03/12/2010 1102   EOSABS 0.3 06/09/2016 1219   EOSABS 0.2 03/12/2010 1102   BASOSABS 0.1 06/09/2016 1219   BASOSABS 0.0 03/12/2010 1102   CMP     Component Value Date/Time   NA 140 06/09/2016 1219   K 4.6 06/09/2016 1219   CL 108 06/09/2016 1219   CO2 23 06/09/2016 1219   GLUCOSE 97 06/09/2016 1219   BUN 14 06/09/2016 1219   CREATININE 1.64 (H) 06/09/2016 1219   CALCIUM 9.2 06/09/2016 1219   PROT 7.0 06/09/2016 1219   ALBUMIN 3.7 06/09/2016 1219   AST 25 06/09/2016 1219   ALT 13 (L) 06/09/2016 1219   ALKPHOS 91 06/09/2016 1219   BILITOT 0.7 06/09/2016 1219   GFRNONAA 29 (L) 06/09/2016 1219   GFRAA 34 (L) 06/09/2016 1219   TB Gold: 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  She has yearly chest x-rays.  Most recent chest x-ray was on 02/27/16 "IMPRESSION: No active cardiopulmonary disease."   Pregnancy status:  Post-menopausal  Vitals:   07/01/16 1141  BP: 127/73  Resp: 17    Assessment/Plan: Patient is being initiated on leflunomide (Arava) 10 mg every other day for two weeks then 10 mg daily if labs are stable.  Patient was counseled on the purpose, proper use, and adverse effects of leflunomide including risk  of infection, nausea/diarrhea/weight loss, increase in blood pressure, rash, hair loss, tingling in the hands and feet, and signs and symptoms of interstitial lung disease.  Discussed the importance of frequent monitoring of liver function and blood counts, and patient was provided with instructions for standing labs.  Provided patient with educational materials on leflunomide and answered all questions.  Patient consented to [place] use, and consent will be uploaded into the media tab.  Patient was previously on leflunomide and reported difficulty affording the medication.  I discussed cost with [facility].  It was previously $56 for 90 day supply.  She reports she should be able to afford the medication at this time.  Will plan to start the medication after patient has recovered from foot surgery.    Elisabeth Most, Pharm.D., BCPS Clinical Pharmacist Pager: [phone] Phone: [phone] 07/01/2016 1:06 PM

## 2016-07-01 NOTE — Progress Notes (Signed)
Office Visit Note  Patient: [name]             Date of Birth: Jun 02, 1940           MRN: [id number]             PCP: Jonathon Jordan, MD Referring: Jonathon Jordan, MD Visit Date: 07/01/2016 Occupation: @GUAROCC @    Subjective:  Follow-up    History of Present Illness: [name] is a 76 y.o. female  Who was last seen in our office on 02/25/2016  Patient has a history of rheumatoid arthritis and gout. She is getting Orencia IV every month at [facility] for the treatment of rheumatoid arthritis.  For her gout, she takes allopurinol 300 mg daily.   Left foot w/ 4th toe w/ callus and pt went to see dr. Cannon Kettle, foot doctor. Pt has downward curvature of 4th toe that causes the callus to form.  Pt's doctor (podiatrist) wants to do minor surgery to fix this problem.  Patient is complaining of on going joint pain despite taking Orencia IV every month.  Note: We have tried dual therapy for patient in the past but due to cost or side effects, patient has only been doing Orencia IV every month. Namely, Plaquenil was too expensive; recently patient states that Jolee Ewing is too expensive. Patient discontinued methotrexate due to abnormal kidney function. Patient has a history of diabetes and does not want to take prednisone due to concerns of elevated sugars. Note: Patient had chest pain in October visit to our office. In addition, patient was hospitalized for concern of MI back in February 2018. ( Please see Epic for details ).  No flare of gout. Patient reports that she's taking allopurinol 300 mg daily  Activities of Daily Living:  Patient reports morning stiffness for 60 minutes.   Patient Reports nocturnal pain.  Difficulty dressing/grooming: Reports Difficulty climbing stairs: Reports Difficulty getting out of chair: Reports Difficulty using hands for taps, buttons, cutlery, and/or writing: Reports   Review of Systems  Constitutional: Negative for  fatigue.  HENT: Negative for mouth sores and mouth dryness.   Eyes: Negative for dryness.  Respiratory: Negative for shortness of breath.   Gastrointestinal: Negative for constipation and diarrhea.  Musculoskeletal: Negative for myalgias and myalgias.  Skin: Negative for sensitivity to sunlight.  Psychiatric/Behavioral: Negative for decreased concentration and sleep disturbance.    PMFS History:  Patient Active Problem List   Diagnosis Date Noted  . 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 Elevated d-dimer   . Encephalopathy   . Sinus bradycardia   . Acute encephalopathy 02/27/2016  . Seropositive rheumatoid arthritis of multiple sites ([place]) 01/17/2016  . High risk medications (not anticoagulants) long-term use 01/17/2016  . Bilateral hand pain 01/17/2016  . Bilateral wrist pain 01/17/2016  . Pain in right elbow 01/17/2016  . Left shoulder pain 01/17/2016  . Encounter for therapeutic drug monitoring 11/02/2015  . Bronchiectasis without complication ([place]) 68/34/1962  . Chronic gout involving toe without tophus 10/30/2015  . High risk medication use 10/29/2015  . Gout 10/29/2015  . H/O total knee replacement, left 10/29/2015  . History of positive PPD 10/29/2015  . Diabetes mellitus, type II, insulin dependent ([place]) 10/18/2015  . Rheumatoid arthritis flare ([place]) 10/18/2015  . AKI (acute kidney injury) ([place]) 10/18/2015  . CKD (chronic kidney disease) stage 3, GFR 30-59 ml/min 10/18/2015  . Nodule of left lung 10/18/2015  . Neuropathy 08/22/2014  . Hypothyroidism 08/22/2014  .  Chronic pain 08/22/2014  . Chronic congestive heart failure with left ventricular diastolic dysfunction ([place]) 08/08/2013  . Chest pain 08/05/2013  . Aortic stenosis 12/22/2012  . Hyperlipidemia 12/22/2012  . PVC (premature ventricular contraction) 12/22/2012  . HTN (hypertension) 12/22/2012  . 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 Open angle with borderline findings, high risk 12/15/2011   . Cataract, nuclear 05/25/2011    Past Medical History:  Diagnosis Date  . Allergic rhinitis   . Anemia   . Aortic stenosis 12/22/2012   Mild 3/14-2.35 m/s peak velocity  . CHF (congestive heart failure) ([place])   . Chronic pain   . CKD (chronic kidney disease), stage III   . Diabetes mellitus   . Diverticulosis   . DVT (deep venous thrombosis) ([place])   . Glaucoma   . Hemorrhoids    internal  . HTN (hypertension)   . Hypercholesteremia   . Hyperlipidemia 12/22/2012  . Hypothyroidism   . Meningioma ([place]) 2008  . Osteoarthritis   . Peripheral neuropathy   . PPD positive    6 months ago  . PVC (premature ventricular contraction) 12/22/2012  . Rheumatoid arthritis(714.0)   . Sickle cell trait ([place])   . 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 Hypotension Mother   . CVA Mother   . Diabetes Mother   . Hypertension Father   . Kidney failure Father    Past Surgical History:  Procedure Laterality Date  . ABDOMINAL HYSTERECTOMY  1978   TAH,& BSO 1  . fistula repair  03/2010   partial colectomy  . intracranial surgery  11/2007   removal of meningioma  . JOINT REPLACEMENT  2001   left TKR  . LEFT HEART CATHETERIZATION WITH CORONARY ANGIOGRAM N/A 08/08/2013   Procedure: LEFT HEART CATHETERIZATION WITH CORONARY ANGIOGRAM;  Surgeon: Peter M Martinique, MD;  Location: [facility] CATH LAB;  Service: Cardiovascular;  Laterality: N/A;  . ROTATOR CUFF REPAIR  1999   BIlateral  . SIGMOIDECTOMY  2012  . TONSILLECTOMY AND ADENOIDECTOMY    . TOTAL THYROIDECTOMY  1992  . wrist sx     right    Social History   Social History Narrative   Daycare Provider   Drinks about 1 cup of coffee a day      Objective: Vital Signs: BP 127/73 (BP Location: Left Arm, Patient Position: Sitting, Cuff Size: Large)   Resp 17   Ht 5' 4"  (1.626 m)   Wt 188 lb (85.3 kg)   LMP  (LMP Unknown)   BMI 32.27  kg/m    Physical Exam  Constitutional: She is oriented to person, place, and time. She appears well-developed and well-nourished.  HENT:  Head: Normocephalic and atraumatic.  Eyes: EOM are normal. Pupils are equal, round, and reactive to light.  Cardiovascular: Normal rate, regular rhythm and normal heart sounds.  Exam reveals no gallop and no friction rub.   No murmur heard. Pulmonary/Chest: Effort normal and breath sounds normal. She has no wheezes. She has no rales.  Abdominal: Soft. Bowel sounds are normal. She exhibits no distension. There is no tenderness. There is no guarding. No hernia.  Musculoskeletal: Normal range of motion. She exhibits no edema, tenderness or deformity.  Lymphadenopathy:    She has no cervical adenopathy.  Neurological: She is alert and oriented to person, place, and time. Coordination normal.  Skin: Skin is warm  and dry. Capillary refill takes less than 2 seconds. No rash noted.  Psychiatric: She has a normal mood and affect. Her behavior is normal.  Nursing note and vitals reviewed.    Musculoskeletal Exam:  Full range of motion of all joints but with difficulty secondary to pain Fibromyalgia tender points are absent Grip strength is decreased bilaterally but 95% fist formation  CDAI Exam: CDAI Homunculus Exam:   Swelling:  Right hand: 1st MCP, 2nd MCP and 3rd MCP Left hand: 1st MCP, 2nd MCP, 3rd MCP and 5th MCP  Joint Counts:  CDAI Tender Joint count: 0 CDAI Swollen Joint count: 7   C/w synovitis  Investigation: No additional findings. Hospital Outpatient Visit on 06/09/2016  Component Date Value Ref Range Status  . WBC 06/09/2016 9.2  4.0 - 10.5 K/uL Final  . RBC 06/09/2016 3.49* 3.87 - 5.11 MIL/uL Final  . Hemoglobin 06/09/2016 10.0* 12.0 - 15.0 g/dL Final  . HCT 06/09/2016 30.4* 36.0 - 46.0 % Final  . MCV 06/09/2016 87.1  78.0 - 100.0 fL Final  . MCH 06/09/2016 28.7  26.0 - 34.0 pg Final  . MCHC 06/09/2016 32.9  30.0 - 36.0 g/dL  Final  . RDW 06/09/2016 15.5  11.5 - 15.5 % Final  . Platelets 06/09/2016 289  150 - 400 K/uL Final  . Neutrophils Relative % 06/09/2016 49  % Final  . Neutro Abs 06/09/2016 4.7  1.7 - 7.7 K/uL Final  . Lymphocytes Relative 06/09/2016 41  % Final  . Lymphs Abs 06/09/2016 3.7  0.7 - 4.0 K/uL Final  . Monocytes Relative 06/09/2016 5  % Final  . Monocytes Absolute 06/09/2016 0.4  0.1 - 1.0 K/uL Final  . Eosinophils Relative 06/09/2016 4  % Final  . Eosinophils Absolute 06/09/2016 0.3  0.0 - 0.7 K/uL Final  . Basophils Relative 06/09/2016 1  % Final  . Basophils Absolute 06/09/2016 0.1  0.0 - 0.1 K/uL Final  . Sodium 06/09/2016 140  135 - 145 mmol/L Final  . Potassium 06/09/2016 4.6  3.5 - 5.1 mmol/L Final  . Chloride 06/09/2016 108  101 - 111 mmol/L Final  . CO2 06/09/2016 23  22 - 32 mmol/L Final  . Glucose, Bld 06/09/2016 97  65 - 99 mg/dL Final  . BUN 06/09/2016 14  6 - 20 mg/dL Final  . Creatinine, Ser 06/09/2016 1.64* 0.44 - 1.00 mg/dL Final  . Calcium 06/09/2016 9.2  8.9 - 10.3 mg/dL Final  . Total Protein 06/09/2016 7.0  6.5 - 8.1 g/dL Final  . Albumin 06/09/2016 3.7  3.5 - 5.0 g/dL Final  . AST 06/09/2016 25  15 - 41 U/L Final  . ALT 06/09/2016 13* 14 - 54 U/L Final  . Alkaline Phosphatase 06/09/2016 91  38 - 126 U/L Final  . Total Bilirubin 06/09/2016 0.7  0.3 - 1.2 mg/dL Final  . GFR calc non Af Amer 06/09/2016 29* >60 mL/min Final  . GFR calc Af Amer 06/09/2016 34* >60 mL/min Final   Comment: (NOTE) The eGFR has been calculated using the CKD EPI equation. This calculation has not been validated in all clinical situations. eGFR's persistently <60 mL/min signify possible Chronic Kidney Disease.   Georgiann Hahn gap 06/09/2016 9  5 - 15 Final  Hospital Outpatient Visit on 04/14/2016  Component Date Value Ref Range Status  . WBC 04/14/2016 7.3  4.0 - 10.5 K/uL Final  . RBC 04/14/2016 3.83* 3.87 - 5.11 MIL/uL Final  . Hemoglobin 04/14/2016 11.1* 12.0 - 15.0 g/dL  Final  . HCT  04/14/2016 33.6* 36.0 - 46.0 % Final  . MCV 04/14/2016 87.7  78.0 - 100.0 fL Final  . MCH 04/14/2016 29.0  26.0 - 34.0 pg Final  . MCHC 04/14/2016 33.0  30.0 - 36.0 g/dL Final  . RDW 04/14/2016 16.4* 11.5 - 15.5 % Final  . Platelets 04/14/2016 333  150 - 400 K/uL Final  . Neutrophils Relative % 04/14/2016 42  % Final  . Neutro Abs 04/14/2016 3.1  1.7 - 7.7 K/uL Final  . Lymphocytes Relative 04/14/2016 47  % Final  . Lymphs Abs 04/14/2016 3.5  0.7 - 4.0 K/uL Final  . Monocytes Relative 04/14/2016 7  % Final  . Monocytes Absolute 04/14/2016 0.5  0.1 - 1.0 K/uL Final  . Eosinophils Relative 04/14/2016 3  % Final  . Eosinophils Absolute 04/14/2016 0.2  0.0 - 0.7 K/uL Final  . Basophils Relative 04/14/2016 1  % Final  . Basophils Absolute 04/14/2016 0.0  0.0 - 0.1 K/uL Final  . Sodium 04/14/2016 138  135 - 145 mmol/L Final  . Potassium 04/14/2016 5.2* 3.5 - 5.1 mmol/L Final   HEMOLYSIS AT THIS LEVEL MAY AFFECT RESULT  . Chloride 04/14/2016 103  101 - 111 mmol/L Final  . CO2 04/14/2016 26  22 - 32 mmol/L Final  . Glucose, Bld 04/14/2016 144* 65 - 99 mg/dL Final  . BUN 04/14/2016 14  6 - 20 mg/dL Final  . Creatinine, Ser 04/14/2016 1.33* 0.44 - 1.00 mg/dL Final  . Calcium 04/14/2016 9.5  8.9 - 10.3 mg/dL Final  . Total Protein 04/14/2016 7.4  6.5 - 8.1 g/dL Final  . Albumin 04/14/2016 4.0  3.5 - 5.0 g/dL Final  . AST 04/14/2016 31  15 - 41 U/L Final  . ALT 04/14/2016 10* 14 - 54 U/L Final  . Alkaline Phosphatase 04/14/2016 68  38 - 126 U/L Final  . Total Bilirubin 04/14/2016 1.5* 0.3 - 1.2 mg/dL Final  . GFR calc non Af Amer 04/14/2016 37* >60 mL/min Final  . GFR calc Af Amer 04/14/2016 43* >60 mL/min Final   Comment: (NOTE) The eGFR has been calculated using the CKD EPI equation. This calculation has not been validated in all clinical situations. eGFR's persistently <60 mL/min signify possible Chronic Kidney Disease.   . Anion gap 04/14/2016 9  5 - 15 Final  Admission on 02/27/2016,  Discharged on 02/29/2016  Component Date Value Ref Range Status  . Sodium 02/27/2016 142  135 - 145 mmol/L Final  . Potassium 02/27/2016 4.5  3.5 - 5.1 mmol/L Final  . Chloride 02/27/2016 109  101 - 111 mmol/L Final  . BUN 02/27/2016 29* 6 - 20 mg/dL Final  . Creatinine, Ser 02/27/2016 2.00* 0.44 - 1.00 mg/dL Final  . Glucose, Bld 02/27/2016 93  65 - 99 mg/dL Final  . Calcium, Ion 02/27/2016 1.13* 1.15 - 1.40 mmol/L Final  . TCO2 02/27/2016 23  0 - 100 mmol/L Final  . Hemoglobin 02/27/2016 11.6* 12.0 - 15.0 g/dL Final  . HCT 02/27/2016 34.0* 36.0 - 46.0 % Final  . Alcohol, Ethyl (B) 02/27/2016 <5  <5 mg/dL Final   Comment:        LOWEST DETECTABLE LIMIT FOR SERUM ALCOHOL IS 5 mg/dL FOR MEDICAL PURPOSES ONLY   . aPTT 02/27/2016 26  24 - 36 seconds Final  . WBC 02/27/2016 7.7  4.0 - 10.5 K/uL Final  . RBC 02/27/2016 3.80* 3.87 - 5.11 MIL/uL Final  . Hemoglobin 02/27/2016 10.8* 12.0 -  15.0 g/dL Final  . HCT 02/27/2016 32.9* 36.0 - 46.0 % Final  . MCV 02/27/2016 86.6  78.0 - 100.0 fL Final  . MCH 02/27/2016 28.4  26.0 - 34.0 pg Final  . MCHC 02/27/2016 32.8  30.0 - 36.0 g/dL Final  . RDW 02/27/2016 16.9* 11.5 - 15.5 % Final  . Platelets 02/27/2016 268  150 - 400 K/uL Final  . Neutrophils Relative % 02/27/2016 59  % Final  . Neutro Abs 02/27/2016 4.5  1.7 - 7.7 K/uL Final  . Lymphocytes Relative 02/27/2016 34  % Final  . Lymphs Abs 02/27/2016 2.6  0.7 - 4.0 K/uL Final  . Monocytes Relative 02/27/2016 6  % Final  . Monocytes Absolute 02/27/2016 0.5  0.1 - 1.0 K/uL Final  . Eosinophils Relative 02/27/2016 1  % Final  . Eosinophils Absolute 02/27/2016 0.1  0.0 - 0.7 K/uL Final  . Basophils Relative 02/27/2016 0  % Final  . Basophils Absolute 02/27/2016 0.0  0.0 - 0.1 K/uL Final  . Sodium 02/27/2016 140  135 - 145 mmol/L Final  . Potassium 02/27/2016 4.5  3.5 - 5.1 mmol/L Final  . Chloride 02/27/2016 106  101 - 111 mmol/L Final  . CO2 02/27/2016 23  22 - 32 mmol/L Final  . Glucose,  Bld 02/27/2016 108* 65 - 99 mg/dL Final  . BUN 02/27/2016 21* 6 - 20 mg/dL Final  . Creatinine, Ser 02/27/2016 1.73* 0.44 - 1.00 mg/dL Final  . Calcium 02/27/2016 9.9  8.9 - 10.3 mg/dL Final  . Total Protein 02/27/2016 7.3  6.5 - 8.1 g/dL Final  . Albumin 02/27/2016 4.0  3.5 - 5.0 g/dL Final  . AST 02/27/2016 16  15 - 41 U/L Final  . ALT 02/27/2016 13* 14 - 54 U/L Final  . Alkaline Phosphatase 02/27/2016 56  38 - 126 U/L Final  . Total Bilirubin 02/27/2016 0.7  0.3 - 1.2 mg/dL Final  . GFR calc non Af Amer 02/27/2016 27* >60 mL/min Final  . GFR calc Af Amer 02/27/2016 32* >60 mL/min Final   Comment: (NOTE) The eGFR has been calculated using the CKD EPI equation. This calculation has not been validated in all clinical situations. eGFR's persistently <60 mL/min signify possible Chronic Kidney Disease.   . Anion gap 02/27/2016 11  5 - 15 Final  . Troponin i, poc 02/27/2016 0.00  0.00 - 0.08 ng/mL Final  . Comment 3 02/27/2016          Final   Comment: Due to the release kinetics of cTnI, a negative result within the first hours of the onset of symptoms does not rule out myocardial infarction with certainty. If myocardial infarction is still suspected, repeat the test at appropriate intervals.   . Opiates 02/27/2016 POSITIVE* NONE DETECTED Final  . Cocaine 02/27/2016 NONE DETECTED  NONE DETECTED Final  . Benzodiazepines 02/27/2016 NONE DETECTED  NONE DETECTED Final  . Amphetamines 02/27/2016 NONE DETECTED  NONE DETECTED Final  . Tetrahydrocannabinol 02/27/2016 NONE DETECTED  NONE DETECTED Final  . Barbiturates 02/27/2016 NONE DETECTED  NONE DETECTED Final   Comment:        DRUG SCREEN FOR MEDICAL PURPOSES ONLY.  IF CONFIRMATION IS NEEDED FOR ANY PURPOSE, NOTIFY LAB WITHIN 5 DAYS.        LOWEST DETECTABLE LIMITS FOR URINE DRUG SCREEN Drug Class       Cutoff (ng/mL) Amphetamine      1000 Barbiturate      200 Benzodiazepine   229 Tricyclics  300 Opiates           300 Cocaine          300 THC              50   . Color, Urine 02/27/2016 YELLOW  YELLOW Final  . APPearance 02/27/2016 CLEAR  CLEAR Final  . Specific Gravity, Urine 02/27/2016 1.015  1.005 - 1.030 Final  . pH 02/27/2016 5.0  5.0 - 8.0 Final  . Glucose, UA 02/27/2016 NEGATIVE  NEGATIVE mg/dL Final  . Hgb urine dipstick 02/27/2016 NEGATIVE  NEGATIVE Final  . Bilirubin Urine 02/27/2016 NEGATIVE  NEGATIVE Final  . Ketones, ur 02/27/2016 NEGATIVE  NEGATIVE mg/dL Final  . Protein, ur 02/27/2016 NEGATIVE  NEGATIVE mg/dL Final  . Nitrite 02/27/2016 NEGATIVE  NEGATIVE Final  . Leukocytes, UA 02/27/2016 NEGATIVE  NEGATIVE Final  . Glucose-Capillary 02/27/2016 84  65 - 99 mg/dL Final  . TSH 02/27/2016 14.791* 0.350 - 4.500 uIU/mL Final   Performed by a 3rd Generation assay with a functional sensitivity of <=0.01 uIU/mL.  . Free T4 02/27/2016 0.74  0.61 - 1.12 ng/dL Final   Comment: (NOTE) Biotin ingestion may interfere with free T4 tests. If the results are inconsistent with the TSH level, previous test results, or the clinical presentation, then consider biotin interference. If needed, order repeat testing after stopping biotin.   . Glucose-Capillary 02/27/2016 91  65 - 99 mg/dL Final  . Glucose-Capillary 02/28/2016 74  65 - 99 mg/dL Final  . Prothrombin Time 02/27/2016 13.9  11.4 - 15.2 seconds Final  . INR 02/27/2016 1.06   Final  . pH, Ven 02/27/2016 7.439* 7.250 - 7.430 Final  . pCO2, Ven 02/27/2016 32.7* 44.0 - 60.0 mmHg Final  . pO2, Ven 02/27/2016 57.0* 32.0 - 45.0 mmHg Final  . Bicarbonate 02/27/2016 22.1  20.0 - 28.0 mmol/L Final  . TCO2 02/27/2016 23  0 - 100 mmol/L Final  . O2 Saturation 02/27/2016 91.0  % Final  . Acid-base deficit 02/27/2016 2.0  0.0 - 2.0 mmol/L Final  . Patient temperature 02/27/2016 HIDE   Final  . Sample type 02/27/2016 VENOUS   Final  . Ammonia 02/27/2016 9  9 - 35 umol/L Final  . Troponin I 02/28/2016 <0.03  <0.03 ng/mL Final  . Troponin I 02/28/2016  <0.03  <0.03 ng/mL Final  . Influenza A By PCR 02/27/2016 NEGATIVE  NEGATIVE Final  . Influenza B By PCR 02/27/2016 NEGATIVE  NEGATIVE Final   Comment: (NOTE) The Xpert Xpress Flu assay is intended as an aid in the diagnosis of  influenza and should not be used as a sole basis for treatment.  This  assay is FDA approved for nasopharyngeal swab specimens only. Nasal  washings and aspirates are unacceptable for Xpert Xpress Flu testing.   . Hgb A1c MFr Bld 02/28/2016 6.5* 4.8 - 5.6 % Final   Comment: (NOTE)         Pre-diabetes: 5.7 - 6.4         Diabetes: >6.4         Glycemic control for adults with diabetes: <7.0   . Mean Plasma Glucose 02/28/2016 140   Final   Comment: (NOTE) Performed At: [facility], [place] [id number] Lindon Romp MD [id number]   . Cholesterol 02/28/2016 179  0 - 200 mg/dL Final  . Triglycerides 02/28/2016 123  <150 mg/dL Final  . HDL 02/28/2016 47  >40 mg/dL Final  . Total CHOL/HDL Ratio 02/28/2016  3.8  RATIO Final  . VLDL 02/28/2016 25  0 - 40 mg/dL Final  . LDL Cholesterol 02/28/2016 107* 0 - 99 mg/dL Final   Comment:        Total Cholesterol/HDL:CHD Risk Coronary Heart Disease Risk Table                     Men   Women  1/2 Average Risk   3.4   3.3  Average Risk       5.0   4.4  2 X Average Risk   9.6   7.1  3 X Average Risk  23.4   11.0        Use the calculated Patient Ratio above and the CHD Risk Table to determine the patient's CHD Risk.        ATP III CLASSIFICATION (LDL):  <100     mg/dL   Optimal  100-129  mg/dL   Near or Above                    Optimal  130-159  mg/dL   Borderline  160-189  mg/dL   High  >190     mg/dL   Very High   . Right CCA prox sys 02/28/2016 76  cm/s Final  . Right CCA prox dias 02/28/2016 5  cm/s Final  . Right cca dist sys 02/28/2016 -76  cm/s Final  . Left CCA dist sys 02/28/2016 73  cm/s Final  . Left CCA dist dias 02/28/2016 11  cm/s Final  . Left ICA prox sys  02/28/2016 -89  cm/s Final  . Left ICA prox dias 02/28/2016 -16  cm/s Final  . Left ICA dist sys 02/28/2016 -96  cm/s Final  . Left ICA dist dias 02/28/2016 -18  cm/s Final  . RIGHT ECA DIAS 02/28/2016 -2.00  cm/s Final  . RIGHT VERTEBRAL DIAS 02/28/2016 13.00  cm/s Final  . LEFT ECA DIAS 02/28/2016 -6.00  cm/s Final  . LEFT VERTEBRAL DIAS 02/28/2016 -6.00  cm/s Final  . Weight 02/28/2016 2952.4  oz Final  . Height 02/28/2016 64  in Final  . BP 02/28/2016 141/71  mmHg Final  . D-Dimer, Quant 02/27/2016 3.15* 0.00 - 0.50 ug/mL-FEU Final   Comment: (NOTE) At the manufacturer cut-off of 0.50 ug/mL FEU, this assay has been documented to exclude PE with a sensitivity and negative predictive value of 97 to 99%.  At this time, this assay has not been approved by the FDA to exclude DVT/VTE. Results should be correlated with clinical presentation.   Marland Kitchen MRSA by PCR 02/28/2016 NEGATIVE  NEGATIVE Final   Comment:        The GeneXpert MRSA Assay (FDA approved for NASAL specimens only), is one component of a comprehensive MRSA colonization surveillance program. It is not intended to diagnose MRSA infection nor to guide or monitor treatment for MRSA infections.   . Glucose-Capillary 02/28/2016 65  65 - 99 mg/dL Final  . Sodium 02/28/2016 140  135 - 145 mmol/L Final  . Potassium 02/28/2016 4.2  3.5 - 5.1 mmol/L Final  . Chloride 02/28/2016 110  101 - 111 mmol/L Final  . CO2 02/28/2016 22  22 - 32 mmol/L Final  . Glucose, Bld 02/28/2016 91  65 - 99 mg/dL Final  . BUN 02/28/2016 16  6 - 20 mg/dL Final  . Creatinine, Ser 02/28/2016 1.29* 0.44 - 1.00 mg/dL Final  . Calcium 02/28/2016 9.3  8.9 - 10.3 mg/dL Final  .  Total Protein 02/28/2016 6.8  6.5 - 8.1 g/dL Final  . Albumin 02/28/2016 3.4* 3.5 - 5.0 g/dL Final  . AST 02/28/2016 15  15 - 41 U/L Final  . ALT 02/28/2016 12* 14 - 54 U/L Final  . Alkaline Phosphatase 02/28/2016 50  38 - 126 U/L Final  . Total Bilirubin 02/28/2016 0.9  0.3 - 1.2  mg/dL Final  . GFR calc non Af Amer 02/28/2016 39* >60 mL/min Final  . GFR calc Af Amer 02/28/2016 45* >60 mL/min Final   Comment: (NOTE) The eGFR has been calculated using the CKD EPI equation. This calculation has not been validated in all clinical situations. eGFR's persistently <60 mL/min signify possible Chronic Kidney Disease.   . Anion gap 02/28/2016 8  5 - 15 Final  . WBC 02/28/2016 6.4  4.0 - 10.5 K/uL Final   WHITE COUNT CONFIRMED ON SMEAR  . RBC 02/28/2016 3.78* 3.87 - 5.11 MIL/uL Final  . Hemoglobin 02/28/2016 10.8* 12.0 - 15.0 g/dL Final  . HCT 02/28/2016 32.6* 36.0 - 46.0 % Final  . MCV 02/28/2016 86.2  78.0 - 100.0 fL Final  . MCH 02/28/2016 28.6  26.0 - 34.0 pg Final  . MCHC 02/28/2016 33.1  30.0 - 36.0 g/dL Final  . RDW 02/28/2016 16.9* 11.5 - 15.5 % Final  . Platelets 02/28/2016 206  150 - 400 K/uL Final   PLATELET COUNT CONFIRMED BY SMEAR  . Neutrophils Relative % 02/28/2016 44  % Final  . Lymphocytes Relative 02/28/2016 49  % Final  . Monocytes Relative 02/28/2016 5  % Final  . Eosinophils Relative 02/28/2016 2  % Final  . Basophils Relative 02/28/2016 0  % Final  . Neutro Abs 02/28/2016 2.8  1.7 - 7.7 K/uL Final  . Lymphs Abs 02/28/2016 3.2  0.7 - 4.0 K/uL Final  . Monocytes Absolute 02/28/2016 0.3  0.1 - 1.0 K/uL Final  . Eosinophils Absolute 02/28/2016 0.1  0.0 - 0.7 K/uL Final  . Basophils Absolute 02/28/2016 0.0  0.0 - 0.1 K/uL Final  . Glucose-Capillary 02/28/2016 149* 65 - 99 mg/dL Final  . D-Dimer, Quant 02/28/2016 5.97* 0.00 - 0.50 ug/mL-FEU Final   Comment: (NOTE) At the manufacturer cut-off of 0.50 ug/mL FEU, this assay has been documented to exclude PE with a sensitivity and negative predictive value of 97 to 99%.  At this time, this assay has not been approved by the FDA to exclude DVT/VTE. Results should be correlated with clinical presentation.   . T3, Total 02/28/2016 40* 71 - 180 ng/dL Final   Comment: (NOTE) Performed At: [facility] [place], [place] [id number] Lindon Romp MD [id number]   . Glucose-Capillary 02/28/2016 80  65 - 99 mg/dL Final  . Glucose-Capillary 02/28/2016 125* 65 - 99 mg/dL Final  . Glucose-Capillary 02/28/2016 93  65 - 99 mg/dL Final  . WBC 02/29/2016 6.3  4.0 - 10.5 K/uL Final  . RBC 02/29/2016 3.21* 3.87 - 5.11 MIL/uL Final  . Hemoglobin 02/29/2016 9.1* 12.0 - 15.0 g/dL Final  . HCT 02/29/2016 27.8* 36.0 - 46.0 % Final  . MCV 02/29/2016 86.6  78.0 - 100.0 fL Final  . MCH 02/29/2016 28.3  26.0 - 34.0 pg Final  . MCHC 02/29/2016 32.7  30.0 - 36.0 g/dL Final  . RDW 02/29/2016 17.4* 11.5 - 15.5 % Final  . Platelets 02/29/2016 222  150 - 400 K/uL Final  . Sodium 02/29/2016 139  135 - 145 mmol/L Final  . Potassium 02/29/2016  4.7  3.5 - 5.1 mmol/L Final   SLIGHT HEMOLYSIS  . Chloride 02/29/2016 112* 101 - 111 mmol/L Final  . CO2 02/29/2016 19* 22 - 32 mmol/L Final  . Glucose, Bld 02/29/2016 144* 65 - 99 mg/dL Final  . BUN 02/29/2016 16  6 - 20 mg/dL Final  . Creatinine, Ser 02/29/2016 1.31* 0.44 - 1.00 mg/dL Final  . Calcium 02/29/2016 8.7* 8.9 - 10.3 mg/dL Final  . GFR calc non Af Amer 02/29/2016 38* >60 mL/min Final  . GFR calc Af Amer 02/29/2016 44* >60 mL/min Final   Comment: (NOTE) The eGFR has been calculated using the CKD EPI equation. This calculation has not been validated in all clinical situations. eGFR's persistently <60 mL/min signify possible Chronic Kidney Disease.   . Anion gap 02/29/2016 8  5 - 15 Final  . Glucose-Capillary 02/28/2016 123* 65 - 99 mg/dL Final  . Glucose-Capillary 02/29/2016 125* 65 - 99 mg/dL Final  . Glucose-Capillary 02/29/2016 133* 65 - 99 mg/dL Final  . Glucose-Capillary 02/29/2016 99  65 - 99 mg/dL Final  . Glucose-Capillary 02/29/2016 158* 65 - 99 mg/dL Final  Hospital Outpatient Visit on 01/14/2016  Component Date Value Ref Range Status  . WBC 01/14/2016 8.7  4.0 - 10.5 K/uL Final  . RBC 01/14/2016 3.88  3.87 - 5.11  MIL/uL Final  . Hemoglobin 01/14/2016 10.8* 12.0 - 15.0 g/dL Final  . HCT 01/14/2016 32.5* 36.0 - 46.0 % Final  . MCV 01/14/2016 83.8  78.0 - 100.0 fL Final  . MCH 01/14/2016 27.8  26.0 - 34.0 pg Final  . MCHC 01/14/2016 33.2  30.0 - 36.0 g/dL Final  . RDW 01/14/2016 17.4* 11.5 - 15.5 % Final  . Platelets 01/14/2016 302  150 - 400 K/uL Final  . Neutrophils Relative % 01/14/2016 58  % Final  . Neutro Abs 01/14/2016 5.1  1.7 - 7.7 K/uL Final  . Lymphocytes Relative 01/14/2016 35  % Final  . Lymphs Abs 01/14/2016 3.0  0.7 - 4.0 K/uL Final  . Monocytes Relative 01/14/2016 5  % Final  . Monocytes Absolute 01/14/2016 0.4  0.1 - 1.0 K/uL Final  . Eosinophils Relative 01/14/2016 2  % Final  . Eosinophils Absolute 01/14/2016 0.1  0.0 - 0.7 K/uL Final  . Basophils Relative 01/14/2016 0  % Final  . Basophils Absolute 01/14/2016 0.0  0.0 - 0.1 K/uL Final  . Sodium 01/14/2016 139  135 - 145 mmol/L Final  . Potassium 01/14/2016 4.5  3.5 - 5.1 mmol/L Final   SLIGHT HEMOLYSIS  . Chloride 01/14/2016 107  101 - 111 mmol/L Final  . CO2 01/14/2016 21* 22 - 32 mmol/L Final  . Glucose, Bld 01/14/2016 163* 65 - 99 mg/dL Final  . BUN 01/14/2016 16  6 - 20 mg/dL Final  . Creatinine, Ser 01/14/2016 1.48* 0.44 - 1.00 mg/dL Final  . Calcium 01/14/2016 10.1  8.9 - 10.3 mg/dL Final  . Total Protein 01/14/2016 7.6  6.5 - 8.1 g/dL Final  . Albumin 01/14/2016 4.0  3.5 - 5.0 g/dL Final  . AST 01/14/2016 28  15 - 41 U/L Final  . ALT 01/14/2016 16  14 - 54 U/L Final  . Alkaline Phosphatase 01/14/2016 74  38 - 126 U/L Final  . Total Bilirubin 01/14/2016 0.6  0.3 - 1.2 mg/dL Final  . GFR calc non Af Amer 01/14/2016 33* >60 mL/min Final  . GFR calc Af Amer 01/14/2016 38* >60 mL/min Final   Comment: (NOTE) The eGFR has been  calculated using the CKD EPI equation. This calculation has not been validated in all clinical situations. eGFR's persistently <60 mL/min signify possible Chronic Kidney Disease.   . Anion gap  01/14/2016 11  5 - 15 Final     Imaging: No results found.        Speciality Comments: No specialty comments available.    Procedures:  No procedures performed Allergies: Methotrexate derivatives; Other; Penicillins; Remicade [infliximab]; and Zocor [simvastatin]   Assessment / Plan:     Visit Diagnoses: Rheumatoid arthritis, involving unspecified site, unspecified rheumatoid factor presence (HCC)  High risk medications (not anticoagulants) long-term use - Orencia IV q month [facility])  Chronic gout involving toe without tophus, unspecified cause, unspecified laterality - Allopurinol, 300 mg daily  Anemia, unspecified type   Plan: #1: Rheumatoid arthritis Bilateral hands with some synovitis.; Left hand is worse than the right. All MCPs on the left hand except for the fourth one have synovitis. Bilateral wrists also have synovitis.   #2: High risk prescription On Orencia IV every month at [facility] Inadequate response as monotherapy.  On 08/07/2015 note, we reported that patient could not afford Arava.  OFF of MTX since March 2017 ==> [elevated creatinine levels]  On sept 26, 2016 [place] office note: Dr. Keturah Barre wrote the following: We had a detailed discussion regarding different treatment options and their side effects.  I believe she might do better with combination therapy.  Since she has low GFR, the choices are very limited.  After reviewing indications, side effects and contraindications she wanted to start on Plaquenil.  I have given her a handout on Plaquenil.  (pt found plq to be too expensive).  Plan: We've asked the patient to do dual therapy as follows: Orencia IV every month; Arava 10 mg via her insurance (which should cost her about $12 for 30 day supply via insurance per dr. Koleen Nimrod, pharmacist. Pt is agreeable to do this.  #3: Gout. On allopurinol 300 mg every day  #4: Anemia. Patient has a history of anemia. Labs from June, April,  February are fairly consistent. RBC 06/09/2016 3.49* 3.87 - 5.11 MIL/uL Final  Hemoglobin 06/09/2016 10.0* 12.0 - 15.0 g/dL Final  HCT 06/09/2016 30.4* 36.0 - 46.0 % Final   #5: Peripheral edema The right ankle has peripheral edema that's palpable up to her mid anterior tib-fib; but pushing just below the knee and above the knee patient is very tender consistent with peripheral edema but it is not palpable depression can be made with gentle pressure).  #6: Left knee with total knee replacement. Doing well.  #7: Left fourth toe with a callus formation that is being addressed by patient's podiatrist. It has been shaved but that hasn't solved the problem and patient's podiatrist plan to do a small surgery to help the patient alleviate the pain that she is feeling from all the callus formation. Please see photograph in chart for full details.22 The right fourth toe is similar to the left fourth toe but is not having any callus formation at this time.  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  She has yearly chest x-rays.  Most recent chest x-ray was on 02/27/16 "IMPRESSION: No active cardiopulmonary disease."   #11: Return to clinic in 5 months  #12:  See pharmacy note for additional details.  Orders: No orders of the defined types were placed in this encounter.  No orders of the defined types were placed in this encounter.   Face-to-face time spent with patient was 40 minutes. 50% of time was spent in counseling and coordination of care.  Follow-Up Instructions: No Follow-up on file.   Eliezer Lofts, PA-C  Note - This record has been created using [employer].  Chart creation errors have been sought, but may not always  have been located. Such creation errors do not reflect on  the standard of medical care.

## 2016-07-01 NOTE — Progress Notes (Signed)
Infusion orders are current for patient CBC CMP Tylenol Benadryl TB gold NA for this paitent appointments are up to date and follow up appointment is scheduled

## 2016-07-01 NOTE — Patient Instructions (Addendum)
Cancel your Orenica infusion on 07/07/16 since you are having surgery on 07/14/16.  Please wait until 2 weeks after your surgery before rescheduling your Orencia.  We need to make sure there is no sign of infection and you are cleared by the provider before rescheduling your infusion.  We will also plan to start leflunomide after your surgery.  Start taking leflunomide 10 mg every other day for two weeks.  Get labs after two weeks.  If your labs are stable, we will increase the dose to leflunomide 10 mg daily.  Please get labs two weeks after increasing your leflunomide dose.  Standing Labs We placed an order today for your standing lab work.    Please come back and get your standing labs in 2 weeks x 2, then continue to have labs with infusions  We have open lab Monday through Friday from 8:30-11:30 AM and 1:30-4 PM at the office of Dr. Tresa Moore, PA.   The office is located at [address], [place], [place], [place] [address] No appointment is necessary.   Labs are drawn by [facility].  You may receive a bill from [facility] for your lab work. If you have any questions regarding directions or hours of operation,  please call [phone].     Leflunomide tablets What is this medicine? LEFLUNOMIDE (le FLOO na mide) is for rheumatoid arthritis. This medicine may be used for other purposes; ask your health care provider or pharmacist if you have questions. COMMON BRAND NAME(S): Arava What should I tell my health care provider before I take this medicine? They need to know if you have any of these conditions: -alcoholism -bone marrow problems -fever or infection -immune system problems -kidney disease -liver disease -an unusual or allergic reaction to leflunomide, teriflunomide, other medicines, lactose, foods, dyes, or preservatives -pregnant or trying to get pregnant -breast-feeding How should I use this medicine? Take this medicine by mouth with a full glass of  water. Follow the directions on the prescription label. Take your medicine at regular intervals. Do not take your medicine more often than directed. Do not stop taking except on your doctor's advice. Talk to your pediatrician regarding the use of this medicine in children. Special care may be needed. Overdosage: If you think you have taken too much of this medicine contact a poison control center or emergency room at once. NOTE: This medicine is only for you. Do not share this medicine with others. What if I miss a dose? If you miss a dose, take it as soon as you can. If it is almost time for your next dose, take only that dose. Do not take double or extra doses. What may interact with this medicine? Do not take this medicine with any of the following medications: -teriflunomide This medicine may also interact with the following medications: -charcoal -cholestyramine -methotrexate -NSAIDs, medicines for pain and inflammation, like ibuprofen or naproxen -phenytoin -rifampin -tolbutamide -vaccines -warfarin This list may not describe all possible interactions. Give your health care provider a list of all the medicines, herbs, non-prescription drugs, or dietary supplements you use. Also tell them if you smoke, drink alcohol, or use illegal drugs. Some items may interact with your medicine. What should I watch for while using this medicine? Visit your doctor or health care professional for regular checks on your progress. You will need frequent blood checks while you are receiving the medicine. If you get a cold or other infection while receiving this medicine, call your doctor or  health care professional. Do not treat yourself. The medicine may increase your risk of getting an infection. If you are a woman who has the potential to become pregnant, discuss birth control options with your doctor or health care professional. Dennis Bast must not be pregnant, and you must be using a reliable form of birth  control. The medicine may harm an unborn baby. Immediately call your doctor if you think you might be pregnant. Alcoholic drinks may increase possible damage to your liver. Do not drink alcohol while taking this medicine. What side effects may I notice from receiving this medicine? Side effects that you should report to your doctor or health care professional as soon as possible: -allergic reactions like skin rash, itching or hives, swelling of the face, lips, or tongue -cough -difficulty breathing or shortness of breath -fever, chills or any other sign of infection -redness, blistering, peeling or loosening of the skin, including inside the mouth -unusual bleeding or bruising -unusually weak or tired -vomiting -yellowing of eyes or skin Side effects that usually do not require medical attention (report to your doctor or health care professional if they continue or are bothersome): -diarrhea -hair loss -headache -nausea This list may not describe all possible side effects. Call your doctor for medical advice about side effects. You may report side effects to FDA at 1-800-FDA-1088. Where should I keep my medicine? Keep out of the reach of children. Store at room temperature between 15 and 30 degrees C (59 and 86 degrees F). Protect from moisture and light. Throw away any unused medicine after the expiration date. NOTE: This sheet is a summary. It may not cover all possible information. If you have questions about this medicine, talk to your doctor, pharmacist, or health care provider.  2018 Elsevier/Gold Standard (2012-12-21 10:53:11)

## 2016-07-07 ENCOUNTER — Encounter (HOSPITAL_COMMUNITY): Payer: Medicare Other

## 2016-07-07 ENCOUNTER — Ambulatory Visit: Payer: Medicare Other | Admitting: Sports Medicine

## 2016-07-14 ENCOUNTER — Encounter: Payer: Self-pay | Admitting: Sports Medicine

## 2016-07-14 ENCOUNTER — Ambulatory Visit (INDEPENDENT_AMBULATORY_CARE_PROVIDER_SITE_OTHER): Payer: Medicare Other | Admitting: Sports Medicine

## 2016-07-14 VITALS — BP 180/75 | HR 56 | Resp 16 | Ht 64.0 in | Wt 188.0 lb

## 2016-07-14 DIAGNOSIS — M2040 Other hammer toe(s) (acquired), unspecified foot: Secondary | ICD-10-CM | POA: Diagnosis not present

## 2016-07-14 DIAGNOSIS — M79676 Pain in unspecified toe(s): Secondary | ICD-10-CM | POA: Diagnosis not present

## 2016-07-14 DIAGNOSIS — Z9889 Other specified postprocedural states: Secondary | ICD-10-CM

## 2016-07-14 DIAGNOSIS — E1140 Type 2 diabetes mellitus with diabetic neuropathy, unspecified: Secondary | ICD-10-CM | POA: Diagnosis not present

## 2016-07-14 DIAGNOSIS — Z794 Long term (current) use of insulin: Secondary | ICD-10-CM | POA: Diagnosis not present

## 2016-07-14 DIAGNOSIS — L84 Corns and callosities: Secondary | ICD-10-CM

## 2016-07-14 MED ORDER — PROMETHAZINE HCL 12.5 MG PO TABS: 12.5000 mg | ORAL_TABLET | Freq: Four times a day (QID) | ORAL | 0 refills | Status: DC | PRN

## 2016-07-14 MED ORDER — HYDROCODONE-ACETAMINOPHEN 5-325 MG PO TABS
1.0000 | ORAL_TABLET | Freq: Four times a day (QID) | ORAL | 0 refills | Status: AC | PRN
Start: 2016-07-14 — End: 2016-07-21

## 2016-07-14 MED ORDER — CLINDAMYCIN HCL 300 MG PO CAPS: 300.0000 mg | ORAL_CAPSULE | Freq: Three times a day (TID) | ORAL | 0 refills | Status: DC

## 2016-07-14 MED ORDER — DOCUSATE SODIUM 100 MG PO CAPS: 100.0000 mg | ORAL_CAPSULE | Freq: Two times a day (BID) | ORAL | 0 refills | Status: DC

## 2016-07-14 NOTE — Patient Instructions (Addendum)
If your are able to you may take Motrin (Ibuprofen) or Tylenol for minor pain or for pain that's unrelieved by pain medication.  Call office if there are any problems or issues. If your dressing is soiled or wet or comes off, call the office for instructions on how to take care of your surgical area.  -Dr. Cannon Kettle

## 2016-07-14 NOTE — Progress Notes (Signed)
DATE OF OPERATION: 07-14-16  PREOPERATIVE DIAGNOSES:  1. 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 Management of 4th hammertoe, left foot  OPERATION PERFORMED:  1. Flexor tenotomy, left 4th hammertoe   SURGEON: Landis Martins, DPM   ASSISTANT: None  ESTIMATED BLOOD LOSS:  Minimal.   HEMOSTASIS:  Manuel toe tourniquet x 5 mins   INJECTABLES:  42mL of 1% lidocaine plain and 0.5% Marcaine plain.   INDICATIONS FOR OPERATION:  The patient is a 76 y.o. diabetic female patient with clinical and radiographic signs and symptoms consistent with the above-stated diagnosis.The patient has chosen surgical management of the diagnosis and has consented for surgery. All risks, benefits and complications have been explained to the patient including but not limited to recurrence of the deformity, dehiscence of incision site and the need for further surgery. The patient understands. No guarantees were made or implied to the outcome of the surgery.   DESCRIPTION OF OPERATION:  The patient was brought to the in office operating room and positioned in [place] chair in the supine position. Following appropriate padding of all bony areas, local anesthesia was administered using injectable as above. Following the foot was then prepped and draped and toe tourniquet placed and set manual in a sterile fashion.   Attention was directed towards the 4th toe left foot where anesthesia was confirmed and then using a #11 blade. Stab incision was deepened down through the layers of subcutaneous tissues using sharp and blunt dissection with all venous tributaries being isolated. The flexor tendon was identified and transected with a release of the deformity noted and the 4th toe sitting in a more rectus position. The skin was re-coapted utilizing 5-0 nylon in a simple interrupted suture fashion. Using the blade the distal clavi was trimmed and then a dry sterile dressing was applied and the  tourniquet was rapidly deflated and prompt instantaneous hyperemic response was noted to all aspects of the patient's left lower extremity with digital capillary refill time measuring less than 3 seconds to digits 1-5 of the left foot.  The patient tolerated the procedure and local anesthesia well and was transported from the operating room to the recovery waiting area with vital signs stable and vascular status intact to all aspects of the patient's left lower extremity.   Following a period of postoperative monitoring, the patient was discharged home with oral and written instructions per Dr. Cannon Kettle. The patient was given a prescription of Norco 5/325mg  PRN pain, Clindamycin 300mg  TID preventative measures, Colace 100mg  PRN constipation, Phenergan 25mg  PRN nausea. The patient will remain weightbearing with post op shoe and was informed to keep dressing clean dry and intact and to follow up in office in 1 week for continued post op care.  Landis Martins, DPM

## 2016-07-15 ENCOUNTER — Telehealth: Payer: Self-pay | Admitting: Sports Medicine

## 2016-07-15 NOTE — Telephone Encounter (Addendum)
Post op check phone call made to patient. Patient reports that she is doing well. I reminded patient to keep dressing to left foot clean and dry and to continue with rest, ice, elevation and to follow up in office as scheduled on next week. Patient expressed understanding. -Dr. Cannon Kettle

## 2016-07-22 ENCOUNTER — Ambulatory Visit (INDEPENDENT_AMBULATORY_CARE_PROVIDER_SITE_OTHER): Payer: Self-pay | Admitting: Sports Medicine

## 2016-07-22 DIAGNOSIS — L84 Corns and callosities: Secondary | ICD-10-CM

## 2016-07-22 DIAGNOSIS — Z9889 Other specified postprocedural states: Secondary | ICD-10-CM

## 2016-07-22 DIAGNOSIS — M2040 Other hammer toe(s) (acquired), unspecified foot: Secondary | ICD-10-CM

## 2016-07-22 MED ORDER — HYDROCODONE-ACETAMINOPHEN 5-325 MG PO TABS: 1.0000 | ORAL_TABLET | Freq: Four times a day (QID) | ORAL | 0 refills | Status: AC | PRN

## 2016-07-22 NOTE — Progress Notes (Signed)
Subjective: [name] is a 76 y.o. female patient seen today in office for POV #1 (DOS 07-14-16), S/P left 4th toe tenotomy.Patient denies pain at surgical site, denies calf pain, denies headache, chest pain, shortness of breath, nausea, vomiting, fever, or chills. Patient states that she is doing well and is only taking pain medication at end of day if needed. No other issues noted.   Patient Active Problem List   Diagnosis Date Noted  . 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 Elevated d-dimer   . Encephalopathy   . Sinus bradycardia   . Acute encephalopathy 02/27/2016  . Seropositive rheumatoid arthritis of multiple sites ([place]) 01/17/2016  . High risk medications (not anticoagulants) long-term use 01/17/2016  . Bilateral hand pain 01/17/2016  . Bilateral wrist pain 01/17/2016  . Pain in right elbow 01/17/2016  . Left shoulder pain 01/17/2016  . Encounter for therapeutic drug monitoring 11/02/2015  . Bronchiectasis without complication ([place]) 37/85/8850  . Chronic gout involving toe without tophus 10/30/2015  . High risk medication use 10/29/2015  . Gout 10/29/2015  . H/O total knee replacement, left 10/29/2015  . History of positive PPD 10/29/2015  . Diabetes mellitus, type II, insulin dependent ([place]) 10/18/2015  . Rheumatoid arthritis flare ([place]) 10/18/2015  . AKI (acute kidney injury) ([place]) 10/18/2015  . CKD (chronic kidney disease) stage 3, GFR 30-59 ml/min 10/18/2015  . Nodule of left lung 10/18/2015  . Neuropathy 08/22/2014  . Hypothyroidism 08/22/2014  . Chronic pain 08/22/2014  . Chronic congestive heart failure with left ventricular diastolic dysfunction ([place]) 08/08/2013  . Chest pain 08/05/2013  . Aortic stenosis 12/22/2012  . Hyperlipidemia 12/22/2012  . PVC (premature ventricular contraction) 12/22/2012  . HTN (hypertension) 12/22/2012  . 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 Open angle with borderline findings, high risk 12/15/2011  .  Cataract, nuclear 05/25/2011    Current Outpatient Prescriptions on File Prior to Visit  Medication Sig Dispense Refill  . Abatacept (ORENCIA IV) Inject into the vein. Daughters are not sure of doses. Received in the short stay area once a month    . acetaminophen (TYLENOL) 500 MG tablet Take 1,000 mg by mouth every 6 (six) hours as needed for mild pain.    Marland Kitchen allopurinol (ZYLOPRIM) 300 MG tablet Take 1 tablet (300 mg total) by mouth daily. 30 tablet 5  . aspirin EC 81 MG EC tablet Take 1 tablet (81 mg total) by mouth daily. 30 tablet 0  . atorvastatin (LIPITOR) 40 MG tablet Take 40 mg by mouth daily at 6 PM.     . atorvastatin (LIPITOR) 80 MG tablet     . BD INSULIN SYRINGE ULTRAFINE 31G X 15/64" 0.3 ML MISC     . cholecalciferol (VITAMIN D) 1000 UNITS tablet Take 1,000 Units by mouth daily.     . clindamycin (CLEOCIN) 300 MG capsule Take 1 capsule (300 mg total) by mouth 3 (three) times daily. 30 capsule 0  . clotrimazole-betamethasone (LOTRISONE) cream     . cyclobenzaprine (FLEXERIL) 10 MG tablet Take 10 mg by mouth at bedtime.     . diclofenac sodium (VOLTAREN) 1 % GEL 3 g to 3 larges joints up to three times daily (Patient taking differently: Apply 2 g topically 3 (three) times daily. 3 g to 3 larges joints up to three times daily) 1 Tube 3  . docusate sodium (COLACE) 100 MG capsule Take 1 capsule (100 mg total) by mouth 2 (two) times daily. 10 capsule 0  . ferrous sulfate (  FEOSOL) 325 (65 FE) MG tablet Take 325-650 mg by mouth daily with breakfast.    . folic acid (FOLVITE) 1 MG tablet Take 1 mg by mouth daily.    . insulin NPH-regular Human (NOVOLIN 70/30) (70-30) 100 UNIT/ML injection Inject 6-8 Units into the skin 3 (three) times daily with meals. Sliding source    . levothyroxine (SYNTHROID, LEVOTHROID) 150 MCG tablet Take 1 tablet (150 mcg total) by mouth daily before breakfast. (Patient taking differently: Take 175 mcg by mouth daily before breakfast. )    . levothyroxine  (SYNTHROID, LEVOTHROID) 175 MCG tablet     . losartan (COZAAR) 50 MG tablet Take 50 mg by mouth every evening.    . multivitamin (THERAGRAN) per tablet Take 1 tablet by mouth daily.     . pregabalin (LYRICA) 75 MG capsule 1 bid 54 capsule 0  . promethazine (PHENERGAN) 12.5 MG tablet Take 1 tablet (12.5 mg total) by mouth every 6 (six) hours as needed for nausea or vomiting. 30 tablet 0  . VITAMIN E PO Take 1 tablet by mouth daily.     Current Facility-Administered Medications on File Prior to Visit  Medication Dose Route Frequency Provider Last Rate Last Dose  . triamcinolone acetonide (KENALOG) 10 MG/ML injection 10 mg  10 mg Other Once Landis Martins, DPM      . triamcinolone acetonide (KENALOG) 10 MG/ML injection 10 mg  10 mg Other Once Landis Martins, DPM        Allergies  Allergen Reactions  . Methotrexate Derivatives Other (See Comments)    Elevated creat. Levels  . Other Other (See Comments)    NO Blood products.  [name]- causes low blood sugar (pt states she was taking medication but cant remember what it was for.)  . Penicillins Rash    Has patient had a PCN reaction causing immediate rash, facial/tongue/throat swelling, SOB or lightheadedness with hypotension: Yes Has patient had a PCN reaction causing severe rash involving mucus membranes or skin necrosis: Yes  Has patient had a PCN reaction that required hospitalization No Has patient had a PCN reaction occurring within the last 10 years: No If all of the above answers are "NO", then may proceed with Cephalosporin use.   . Remicade [Infliximab] Other (See Comments)    Inadequate response   . Zocor [Simvastatin] Other (See Comments)    Leg cramps     Objective: There were no vitals filed for this visit.  General: No acute distress, AAOx3  Left foot: Suture intact with no gapping or dehiscence at surgical site, mild swelling to left foot at 4th toe, no erythema, no warmth, no drainage, no signs of infection  noted, Capillary fill time <3 seconds in all digits, gross sensation present via light touch to left foot. No pain or crepitation with range of motion left foot.  No pain with calf compression.   Assessment and Plan:  Problem List Items Addressed This Visit    None    Visit Diagnoses    S/P foot surgery, left    -  Primary   Relevant Medications   HYDROcodone-acetaminophen (NORCO) 5-325 MG tablet   Hammer toe, unspecified laterality       Corns and callosities           -Patient seen and evaluated -Applied dry sterile dressing to surgical site left foot secured with stockinet  -Advised patient to make sure to keep dressings clean, dry, and intact to left surgical site -Advised patient to continue with  post-op shoe on left foot   -Advised patient to limit activity to necessity  -Advised patient to ice and elevate as necessary  -Refilled Norco  -Will plan for suture removal at next office visit. In the meantime, patient to call office if any issues or problems arise.   Landis Martins, DPM

## 2016-07-28 DIAGNOSIS — Z83511 Family history of glaucoma: Secondary | ICD-10-CM | POA: Diagnosis not present

## 2016-07-28 DIAGNOSIS — H2513 Age-related nuclear cataract, bilateral: Secondary | ICD-10-CM | POA: Diagnosis not present

## 2016-07-28 DIAGNOSIS — H40003 Preglaucoma, unspecified, bilateral: Secondary | ICD-10-CM | POA: Diagnosis not present

## 2016-07-29 ENCOUNTER — Ambulatory Visit (INDEPENDENT_AMBULATORY_CARE_PROVIDER_SITE_OTHER): Payer: Self-pay | Admitting: Sports Medicine

## 2016-07-29 DIAGNOSIS — Z9889 Other specified postprocedural states: Secondary | ICD-10-CM

## 2016-07-29 DIAGNOSIS — M79676 Pain in unspecified toe(s): Secondary | ICD-10-CM

## 2016-07-29 DIAGNOSIS — M2040 Other hammer toe(s) (acquired), unspecified foot: Secondary | ICD-10-CM

## 2016-07-29 DIAGNOSIS — Z794 Long term (current) use of insulin: Secondary | ICD-10-CM

## 2016-07-29 DIAGNOSIS — E1140 Type 2 diabetes mellitus with diabetic neuropathy, unspecified: Secondary | ICD-10-CM

## 2016-07-29 NOTE — Progress Notes (Signed)
Subjective: [name] is a 76 y.o. female patient seen today in office for POV #2 (DOS 07-14-16), S/P left 4th toe tenotomy.Patient denies pain at surgical site, denies calf pain, denies headache, chest pain, shortness of breath, nausea, vomiting, fever, or chills. Patient states that she is doing well. No other issues noted.   Patient Active Problem List   Diagnosis Date Noted  . 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 Elevated d-dimer   . Encephalopathy   . Sinus bradycardia   . Acute encephalopathy 02/27/2016  . Seropositive rheumatoid arthritis of multiple sites ([place]) 01/17/2016  . High risk medications (not anticoagulants) long-term use 01/17/2016  . Bilateral hand pain 01/17/2016  . Bilateral wrist pain 01/17/2016  . Pain in right elbow 01/17/2016  . Left shoulder pain 01/17/2016  . Encounter for therapeutic drug monitoring 11/02/2015  . Bronchiectasis without complication ([place]) 13/24/4010  . Chronic gout involving toe without tophus 10/30/2015  . High risk medication use 10/29/2015  . Gout 10/29/2015  . H/O total knee replacement, left 10/29/2015  . History of positive PPD 10/29/2015  . Diabetes mellitus, type II, insulin dependent ([place]) 10/18/2015  . Rheumatoid arthritis flare ([place]) 10/18/2015  . AKI (acute kidney injury) ([place]) 10/18/2015  . CKD (chronic kidney disease) stage 3, GFR 30-59 ml/min 10/18/2015  . Nodule of left lung 10/18/2015  . Neuropathy 08/22/2014  . Hypothyroidism 08/22/2014  . Chronic pain 08/22/2014  . Chronic congestive heart failure with left ventricular diastolic dysfunction ([place]) 08/08/2013  . Chest pain 08/05/2013  . Aortic stenosis 12/22/2012  . Hyperlipidemia 12/22/2012  . PVC (premature ventricular contraction) 12/22/2012  . HTN (hypertension) 12/22/2012  . 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 Open angle with borderline findings, high risk 12/15/2011  . Cataract, nuclear 05/25/2011    Current Outpatient  Prescriptions on File Prior to Visit  Medication Sig Dispense Refill  . Abatacept (ORENCIA IV) Inject into the vein. Daughters are not sure of doses. Received in the short stay area once a month    . acetaminophen (TYLENOL) 500 MG tablet Take 1,000 mg by mouth every 6 (six) hours as needed for mild pain.    Marland Kitchen allopurinol (ZYLOPRIM) 300 MG tablet Take 1 tablet (300 mg total) by mouth daily. 30 tablet 5  . aspirin EC 81 MG EC tablet Take 1 tablet (81 mg total) by mouth daily. 30 tablet 0  . atorvastatin (LIPITOR) 40 MG tablet Take 40 mg by mouth daily at 6 PM.     . atorvastatin (LIPITOR) 80 MG tablet     . BD INSULIN SYRINGE ULTRAFINE 31G X 15/64" 0.3 ML MISC     . cholecalciferol (VITAMIN D) 1000 UNITS tablet Take 1,000 Units by mouth daily.     . clindamycin (CLEOCIN) 300 MG capsule Take 1 capsule (300 mg total) by mouth 3 (three) times daily. 30 capsule 0  . clotrimazole-betamethasone (LOTRISONE) cream     . cyclobenzaprine (FLEXERIL) 10 MG tablet Take 10 mg by mouth at bedtime.     . diclofenac sodium (VOLTAREN) 1 % GEL 3 g to 3 larges joints up to three times daily (Patient taking differently: Apply 2 g topically 3 (three) times daily. 3 g to 3 larges joints up to three times daily) 1 Tube 3  . docusate sodium (COLACE) 100 MG capsule Take 1 capsule (100 mg total) by mouth 2 (two) times daily. 10 capsule 0  . ferrous sulfate (FEOSOL) 325 (65 FE) MG tablet Take 325-650 mg by mouth daily  with breakfast.    . folic acid (FOLVITE) 1 MG tablet Take 1 mg by mouth daily.    Marland Kitchen HYDROcodone-acetaminophen (NORCO) 5-325 MG tablet Take 1 tablet by mouth every 6 (six) hours as needed for moderate pain. 21 tablet 0  . insulin NPH-regular Human (NOVOLIN 70/30) (70-30) 100 UNIT/ML injection Inject 6-8 Units into the skin 3 (three) times daily with meals. Sliding source    . levothyroxine (SYNTHROID, LEVOTHROID) 150 MCG tablet Take 1 tablet (150 mcg total) by mouth daily before breakfast. (Patient taking  differently: Take 175 mcg by mouth daily before breakfast. )    . levothyroxine (SYNTHROID, LEVOTHROID) 175 MCG tablet     . losartan (COZAAR) 50 MG tablet Take 50 mg by mouth every evening.    . multivitamin (THERAGRAN) per tablet Take 1 tablet by mouth daily.     . pregabalin (LYRICA) 75 MG capsule 1 bid 54 capsule 0  . promethazine (PHENERGAN) 12.5 MG tablet Take 1 tablet (12.5 mg total) by mouth every 6 (six) hours as needed for nausea or vomiting. 30 tablet 0  . VITAMIN E PO Take 1 tablet by mouth daily.     Current Facility-Administered Medications on File Prior to Visit  Medication Dose Route Frequency Provider Last Rate Last Dose  . triamcinolone acetonide (KENALOG) 10 MG/ML injection 10 mg  10 mg Other Once Landis Martins, DPM      . triamcinolone acetonide (KENALOG) 10 MG/ML injection 10 mg  10 mg Other Once Landis Martins, DPM        Allergies  Allergen Reactions  . Methotrexate Derivatives Other (See Comments)    Elevated creat. Levels  . Other Other (See Comments)    NO Blood products.  [name]- causes low blood sugar (pt states she was taking medication but cant remember what it was for.)  . Penicillins Rash    Has patient had a PCN reaction causing immediate rash, facial/tongue/throat swelling, SOB or lightheadedness with hypotension: Yes Has patient had a PCN reaction causing severe rash involving mucus membranes or skin necrosis: Yes  Has patient had a PCN reaction that required hospitalization No Has patient had a PCN reaction occurring within the last 10 years: No If all of the above answers are "NO", then may proceed with Cephalosporin use.   . Remicade [Infliximab] Other (See Comments)    Inadequate response   . Zocor [Simvastatin] Other (See Comments)    Leg cramps     Objective: There were no vitals filed for this visit.  General: No acute distress, AAOx3  Left foot: Suture intact with no gapping or dehiscence at surgical site, mild swelling to  left foot at 4th toe, no erythema, no warmth, no drainage, no signs of infection noted, Capillary fill time <3 seconds in all digits, gross sensation present via light touch to left foot. No pain or crepitation with range of motion left foot.  No pain with calf compression.   Assessment and Plan:  Problem List Items Addressed This Visit    None    Visit Diagnoses    S/P foot surgery, left    -  Primary   Hammer toe, unspecified laterality       Pain of toe, unspecified laterality       Type 2 diabetes mellitus with diabetic neuropathy, with long-term current use of insulin ([place])           -Patient seen and evaluated -Suture removed and stockinette applied -Patient to shower as normal -Advised  patient to continue with post-op shoe on left foot with slow transition to normal tennis shoe as instructed over the next 2 weeks  -Advised patient to limit activity to tolerance -Advised patient to ice and elevate as necessary  -Will plan for final left toe check and to start planning for right foot procedure if continuing to do well at next office visit. In the meantime, patient to call office if any issues or problems arise.   Landis Martins, DPM

## 2016-08-05 ENCOUNTER — Ambulatory Visit (INDEPENDENT_AMBULATORY_CARE_PROVIDER_SITE_OTHER)
Admission: RE | Admit: 2016-08-05 | Discharge: 2016-08-05 | Disposition: A | Discharge status: Home / Self Care | Payer: Medicare Other | Source: Ambulatory Visit | Attending: Internal Medicine | Admitting: Internal Medicine

## 2016-08-05 DIAGNOSIS — R911 Solitary pulmonary nodule: Secondary | ICD-10-CM

## 2016-08-05 DIAGNOSIS — R918 Other nonspecific abnormal finding of lung field: Secondary | ICD-10-CM | POA: Diagnosis not present

## 2016-08-11 DIAGNOSIS — M797 Fibromyalgia: Secondary | ICD-10-CM | POA: Diagnosis not present

## 2016-08-11 DIAGNOSIS — I2510 Atherosclerotic heart disease of native coronary artery without angina pectoris: Secondary | ICD-10-CM | POA: Diagnosis not present

## 2016-08-11 DIAGNOSIS — I700 Atherosclerosis of aorta: Secondary | ICD-10-CM | POA: Diagnosis not present

## 2016-08-11 DIAGNOSIS — I350 Nonrheumatic aortic (valve) stenosis: Secondary | ICD-10-CM | POA: Diagnosis not present

## 2016-08-11 DIAGNOSIS — R000 Tachycardia, unspecified: Secondary | ICD-10-CM | POA: Diagnosis not present

## 2016-08-11 DIAGNOSIS — R911 Solitary pulmonary nodule: Secondary | ICD-10-CM | POA: Diagnosis not present

## 2016-08-12 ENCOUNTER — Ambulatory Visit (INDEPENDENT_AMBULATORY_CARE_PROVIDER_SITE_OTHER): Payer: Medicare Other | Admitting: Sports Medicine

## 2016-08-12 DIAGNOSIS — Z9889 Other specified postprocedural states: Secondary | ICD-10-CM

## 2016-08-12 DIAGNOSIS — L84 Corns and callosities: Secondary | ICD-10-CM

## 2016-08-12 DIAGNOSIS — M2040 Other hammer toe(s) (acquired), unspecified foot: Secondary | ICD-10-CM

## 2016-08-12 NOTE — Progress Notes (Signed)
Subjective: [name] is a 76 y.o. female patient seen today in office for POV #3 (DOS 07-14-16), S/P left 4th toe tenotomy.Patient denies pain at surgical site, admits to callus and pain at right 4th toe, denies calf pain, denies headache, chest pain, shortness of breath, nausea, vomiting, fever, or chills.  No other issues noted.   Patient Active Problem List   Diagnosis Date Noted  . 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 Elevated d-dimer   . Encephalopathy   . Sinus bradycardia   . Acute encephalopathy 02/27/2016  . Seropositive rheumatoid arthritis of multiple sites ([place]) 01/17/2016  . High risk medications (not anticoagulants) long-term use 01/17/2016  . Bilateral hand pain 01/17/2016  . Bilateral wrist pain 01/17/2016  . Pain in right elbow 01/17/2016  . Left shoulder pain 01/17/2016  . Encounter for therapeutic drug monitoring 11/02/2015  . Bronchiectasis without complication ([place]) 27/25/3664  . Chronic gout involving toe without tophus 10/30/2015  . High risk medication use 10/29/2015  . Gout 10/29/2015  . H/O total knee replacement, left 10/29/2015  . History of positive PPD 10/29/2015  . Diabetes mellitus, type II, insulin dependent ([place]) 10/18/2015  . Rheumatoid arthritis flare ([place]) 10/18/2015  . AKI (acute kidney injury) ([place]) 10/18/2015  . CKD (chronic kidney disease) stage 3, GFR 30-59 ml/min 10/18/2015  . Nodule of left lung 10/18/2015  . Neuropathy 08/22/2014  . Hypothyroidism 08/22/2014  . Chronic pain 08/22/2014  . Chronic congestive heart failure with left ventricular diastolic dysfunction ([place]) 08/08/2013  . Chest pain 08/05/2013  . Aortic stenosis 12/22/2012  . Hyperlipidemia 12/22/2012  . PVC (premature ventricular contraction) 12/22/2012  . HTN (hypertension) 12/22/2012  . 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 Open angle with borderline findings, high risk 12/15/2011  . Cataract, nuclear 05/25/2011    Current Outpatient  Prescriptions on File Prior to Visit  Medication Sig Dispense Refill  . Abatacept (ORENCIA IV) Inject into the vein. Daughters are not sure of doses. Received in the short stay area once a month    . acetaminophen (TYLENOL) 500 MG tablet Take 1,000 mg by mouth every 6 (six) hours as needed for mild pain.    Marland Kitchen allopurinol (ZYLOPRIM) 300 MG tablet Take 1 tablet (300 mg total) by mouth daily. 30 tablet 5  . aspirin EC 81 MG EC tablet Take 1 tablet (81 mg total) by mouth daily. 30 tablet 0  . atorvastatin (LIPITOR) 40 MG tablet Take 40 mg by mouth daily at 6 PM.     . atorvastatin (LIPITOR) 80 MG tablet     . BD INSULIN SYRINGE ULTRAFINE 31G X 15/64" 0.3 ML MISC     . cholecalciferol (VITAMIN D) 1000 UNITS tablet Take 1,000 Units by mouth daily.     . clindamycin (CLEOCIN) 300 MG capsule Take 1 capsule (300 mg total) by mouth 3 (three) times daily. 30 capsule 0  . clotrimazole-betamethasone (LOTRISONE) cream     . cyclobenzaprine (FLEXERIL) 10 MG tablet Take 10 mg by mouth at bedtime.     . diclofenac sodium (VOLTAREN) 1 % GEL 3 g to 3 larges joints up to three times daily (Patient taking differently: Apply 2 g topically 3 (three) times daily. 3 g to 3 larges joints up to three times daily) 1 Tube 3  . docusate sodium (COLACE) 100 MG capsule Take 1 capsule (100 mg total) by mouth 2 (two) times daily. 10 capsule 0  . ferrous sulfate (FEOSOL) 325 (65 FE) MG tablet Take 325-650 mg  by mouth daily with breakfast.    . folic acid (FOLVITE) 1 MG tablet Take 1 mg by mouth daily.    Marland Kitchen HYDROcodone-acetaminophen (NORCO) 5-325 MG tablet Take 1 tablet by mouth every 6 (six) hours as needed for moderate pain. 21 tablet 0  . insulin NPH-regular Human (NOVOLIN 70/30) (70-30) 100 UNIT/ML injection Inject 6-8 Units into the skin 3 (three) times daily with meals. Sliding source    . levothyroxine (SYNTHROID, LEVOTHROID) 150 MCG tablet Take 1 tablet (150 mcg total) by mouth daily before breakfast. (Patient taking  differently: Take 175 mcg by mouth daily before breakfast. )    . levothyroxine (SYNTHROID, LEVOTHROID) 175 MCG tablet     . losartan (COZAAR) 50 MG tablet Take 50 mg by mouth every evening.    . multivitamin (THERAGRAN) per tablet Take 1 tablet by mouth daily.     . pregabalin (LYRICA) 75 MG capsule 1 bid 54 capsule 0  . promethazine (PHENERGAN) 12.5 MG tablet Take 1 tablet (12.5 mg total) by mouth every 6 (six) hours as needed for nausea or vomiting. 30 tablet 0  . VITAMIN E PO Take 1 tablet by mouth daily.     Current Facility-Administered Medications on File Prior to Visit  Medication Dose Route Frequency Provider Last Rate Last Dose  . triamcinolone acetonide (KENALOG) 10 MG/ML injection 10 mg  10 mg Other Once Landis Martins, DPM      . triamcinolone acetonide (KENALOG) 10 MG/ML injection 10 mg  10 mg Other Once Landis Martins, DPM        Allergies  Allergen Reactions  . Methotrexate Derivatives Other (See Comments)    Elevated creat. Levels  . Other Other (See Comments)    NO Blood products.  [name]- causes low blood sugar (pt states she was taking medication but cant remember what it was for.)  . Penicillins Rash    Has patient had a PCN reaction causing immediate rash, facial/tongue/throat swelling, SOB or lightheadedness with hypotension: Yes Has patient had a PCN reaction causing severe rash involving mucus membranes or skin necrosis: Yes  Has patient had a PCN reaction that required hospitalization No Has patient had a PCN reaction occurring within the last 10 years: No If all of the above answers are "NO", then may proceed with Cephalosporin use.   . Remicade [Infliximab] Other (See Comments)    Inadequate response   . Zocor [Simvastatin] Other (See Comments)    Leg cramps     Objective: There were no vitals filed for this visit.  General: No acute distress, AAOx3  Left foot: Incision well healed at surgical site, mild swelling to left foot at 4th toe, no  erythema, no warmth, no drainage, no signs of infection noted, Capillary fill time <3 seconds in all digits, gross sensation present via light touch to left foot. No pain or crepitation with range of motion left foot.  No pain with calf compression.  Right foot: Corn at 4th toe distal tuft with no acute signs of infection.   Assessment and Plan:  Problem List Items Addressed This Visit    None    Visit Diagnoses    S/P foot surgery, left    -  Primary   Corns and callosities       Hammer toe, unspecified laterality         -Patient seen and evaluated -Corn debrided at right 4th toe. Left 4th toe is healing well. -Continue with good supportive shoes and toe cushions as  needed  -Will plan for nail and callus care at next office visit. Patient will let me know when she decides on tenotomy on right 4th toe.  In the meantime, patient to call office if any issues or problems arise.   Landis Martins, DPM

## 2016-08-15 ENCOUNTER — Other Ambulatory Visit (HOSPITAL_COMMUNITY): Payer: Self-pay | Admitting: *Deleted

## 2016-08-18 ENCOUNTER — Ambulatory Visit (HOSPITAL_COMMUNITY)
Admission: RE | Admit: 2016-08-18 | Discharge: 2016-08-18 | Disposition: A | Discharge status: Home / Self Care | Payer: Medicare Other | Source: Ambulatory Visit | Attending: Rheumatology | Admitting: Rheumatology

## 2016-08-18 DIAGNOSIS — M0579 Rheumatoid arthritis with rheumatoid factor of multiple sites without organ or systems involvement: Secondary | ICD-10-CM | POA: Diagnosis not present

## 2016-08-18 LAB — CBC WITH DIFFERENTIAL/PLATELET
BASOS PCT: 0 %
Basophils Absolute: 0 10*3/uL (ref 0.0–0.1)
EOS PCT: 1 %
Eosinophils Absolute: 0.1 10*3/uL (ref 0.0–0.7)
HCT: 31.7 % — ABNORMAL LOW (ref 36.0–46.0)
Hemoglobin: 10.5 g/dL — ABNORMAL LOW (ref 12.0–15.0)
LYMPHS ABS: 3.3 10*3/uL (ref 0.7–4.0)
Lymphocytes Relative: 43 %
MCH: 27.9 pg (ref 26.0–34.0)
MCHC: 33.1 g/dL (ref 30.0–36.0)
MCV: 84.1 fL (ref 78.0–100.0)
MONOS PCT: 5 %
Monocytes Absolute: 0.3 10*3/uL (ref 0.1–1.0)
Neutro Abs: 3.9 10*3/uL (ref 1.7–7.7)
Neutrophils Relative %: 51 %
PLATELETS: 330 10*3/uL (ref 150–400)
RBC: 3.77 MIL/uL — ABNORMAL LOW (ref 3.87–5.11)
RDW: 15.7 % — AB (ref 11.5–15.5)
WBC: 7.6 10*3/uL (ref 4.0–10.5)

## 2016-08-18 LAB — COMPREHENSIVE METABOLIC PANEL
ALT: 13 U/L — ABNORMAL LOW (ref 14–54)
ANION GAP: 11 (ref 5–15)
AST: 26 U/L (ref 15–41)
Albumin: 3.9 g/dL (ref 3.5–5.0)
Alkaline Phosphatase: 80 U/L (ref 38–126)
BUN: 14 mg/dL (ref 6–20)
CALCIUM: 9.8 mg/dL (ref 8.9–10.3)
CHLORIDE: 106 mmol/L (ref 101–111)
CO2: 22 mmol/L (ref 22–32)
Creatinine, Ser: 1.42 mg/dL — ABNORMAL HIGH (ref 0.44–1.00)
GFR calc non Af Amer: 35 mL/min — ABNORMAL LOW (ref >60)
GFR, EST AFRICAN AMERICAN: 40 mL/min — AB (ref >60)
Glucose, Bld: 133 mg/dL — ABNORMAL HIGH (ref 65–99)
POTASSIUM: 4.2 mmol/L (ref 3.5–5.1)
SODIUM: 139 mmol/L (ref 135–145)
Total Bilirubin: 0.9 mg/dL (ref 0.3–1.2)
Total Protein: 7.7 g/dL (ref 6.5–8.1)

## 2016-08-18 MED ORDER — ACETAMINOPHEN 325 MG PO TABS: 650.0000 mg | ORAL_TABLET | ORAL | Status: DC

## 2016-08-18 MED ORDER — SODIUM CHLORIDE 0.9 % IV SOLN
1000.0000 mg | INTRAVENOUS | Status: DC
  Administered 2016-08-18: 13:00:00 1000 mg via INTRAVENOUS
  Filled 2016-08-18: qty 40

## 2016-08-18 MED ORDER — DIPHENHYDRAMINE HCL 25 MG PO CAPS
25.0000 mg | ORAL_CAPSULE | ORAL | Status: DC
  Administered 2016-08-18: 25 mg via ORAL

## 2016-08-18 MED ORDER — DIPHENHYDRAMINE HCL 25 MG PO CAPS
ORAL_CAPSULE | ORAL | Status: AC
  Filled 2016-08-18: qty 1

## 2016-08-18 NOTE — Progress Notes (Signed)
Labs are stable.

## 2016-08-19 ENCOUNTER — Telehealth: Payer: Self-pay | Admitting: Radiology

## 2016-08-19 NOTE — Telephone Encounter (Signed)
-----   Message from Bo Merino, MD sent at 08/18/2016  1:52 PM EDT ----- Labs are stable

## 2016-08-19 NOTE — Telephone Encounter (Signed)
I have called patient to advise labs are stable  

## 2016-08-22 ENCOUNTER — Telehealth: Payer: Self-pay | Admitting: Pharmacist

## 2016-08-22 DIAGNOSIS — Z79899 Other long term (current) drug therapy: Secondary | ICD-10-CM

## 2016-08-22 MED ORDER — LEFLUNOMIDE 10 MG PO TABS: ORAL_TABLET | ORAL | 0 refills | Status: DC

## 2016-08-22 NOTE — Telephone Encounter (Signed)
yes

## 2016-08-22 NOTE — Telephone Encounter (Signed)
I called patient and reviewed plan with her.  Will plan to start leflunomide 10 mg every other day for two weeks then get labs.  If labs are stable, will increase leflunomide to 10 mg daily.  Plan to get labs two weeks after increasing leflunomide dose.    Patient voiced understanding of plan.  She continues to express concern about cost of medication.  I advised her to call us if she is unable to afford the cost of the medication.   Elisabeth Most, Pharm.D., BCPS, CPP Clinical Pharmacist Pager: [phone] Phone: [phone] 08/22/2016 8:38 AM

## 2016-08-22 NOTE — Telephone Encounter (Signed)
At patient's most recent visit, Mr. Carlyon Shadow discussed re-starting leflunomide in addition to her Orencia infusions.  We were planning to do leflunomide 10 mg every other day for two weeks then 10 mg every day with labs every 2 weeks x 2.  We were waiting until patient had foot surgery.  Patient had surgery on 07/14/16.  At most recent follow up on 08/12/16, "Incision well healed at surgical site, mild swelling to left foot at 4th toe, no erythema, no warmth, no drainage, no signs of infection noted"  Ok to proceed with initiation of leflunomide?

## 2016-09-01 ENCOUNTER — Telehealth: Payer: Self-pay | Admitting: Rheumatology

## 2016-09-01 NOTE — Telephone Encounter (Signed)
Patient picked up medication at pharmacy, and started it last Sunday. Patient will come for lab work next week.

## 2016-09-05 DIAGNOSIS — H40023 Open angle with borderline findings, high risk, bilateral: Secondary | ICD-10-CM | POA: Diagnosis not present

## 2016-09-09 ENCOUNTER — Encounter: Payer: Self-pay | Admitting: Sports Medicine

## 2016-09-09 ENCOUNTER — Ambulatory Visit (INDEPENDENT_AMBULATORY_CARE_PROVIDER_SITE_OTHER): Payer: Medicare Other | Admitting: Sports Medicine

## 2016-09-09 DIAGNOSIS — Z794 Long term (current) use of insulin: Secondary | ICD-10-CM

## 2016-09-09 DIAGNOSIS — L988 Other specified disorders of the skin and subcutaneous tissue: Secondary | ICD-10-CM

## 2016-09-09 DIAGNOSIS — E1140 Type 2 diabetes mellitus with diabetic neuropathy, unspecified: Secondary | ICD-10-CM

## 2016-09-09 DIAGNOSIS — Z9889 Other specified postprocedural states: Secondary | ICD-10-CM

## 2016-09-09 DIAGNOSIS — L84 Corns and callosities: Secondary | ICD-10-CM

## 2016-09-09 NOTE — Progress Notes (Signed)
Subjective: [name] is a 76 y.o. female patient seen today in office for POV #4 (DOS 07-14-16), S/P left 4th toe tenotomy.Patient admits she wore a pair of dress shoes that rubbed her left 4th toe and wanted to have it checked, applied antibiotic cream, denies drainage beside blood when it was 1st rubbed; denies calf pain, denies headache, chest pain, shortness of breath, nausea, vomiting, fever, or chills. No other issues noted.   Patient Active Problem List   Diagnosis Date Noted  . 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 Elevated d-dimer   . Encephalopathy   . Sinus bradycardia   . Acute encephalopathy 02/27/2016  . Seropositive rheumatoid arthritis of multiple sites ([place]) 01/17/2016  . High risk medications (not anticoagulants) long-term use 01/17/2016  . Bilateral hand pain 01/17/2016  . Bilateral wrist pain 01/17/2016  . Pain in right elbow 01/17/2016  . Left shoulder pain 01/17/2016  . Encounter for therapeutic drug monitoring 11/02/2015  . Bronchiectasis without complication ([place]) 28/31/5176  . Chronic gout involving toe without tophus 10/30/2015  . High risk medication use 10/29/2015  . Gout 10/29/2015  . H/O total knee replacement, left 10/29/2015  . History of positive PPD 10/29/2015  . Diabetes mellitus, type II, insulin dependent ([place]) 10/18/2015  . Rheumatoid arthritis flare ([place]) 10/18/2015  . AKI (acute kidney injury) ([place]) 10/18/2015  . CKD (chronic kidney disease) stage 3, GFR 30-59 ml/min 10/18/2015  . Nodule of left lung 10/18/2015  . Neuropathy 08/22/2014  . Hypothyroidism 08/22/2014  . Chronic pain 08/22/2014  . 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 Chest pain 08/05/2013  . Aortic stenosis 12/22/2012  . Hyperlipidemia 12/22/2012  . PVC (premature ventricular contraction) 12/22/2012  . HTN (hypertension) 12/22/2012  . 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 Open angle with  borderline findings, high risk 12/15/2011  . Cataract, nuclear 05/25/2011    Current Outpatient Prescriptions on File Prior to Visit  Medication Sig Dispense Refill  . Abatacept (ORENCIA IV) Inject into the vein. Daughters are not sure of doses. Received in the short stay area once a month    . acetaminophen (TYLENOL) 500 MG tablet Take 1,000 mg by mouth every 6 (six) hours as needed for mild pain.    Marland Kitchen aspirin EC 81 MG EC tablet Take 1 tablet (81 mg total) by mouth daily. 30 tablet 0  . atorvastatin (LIPITOR) 40 MG tablet Take 40 mg by mouth daily at 6 PM.     . atorvastatin (LIPITOR) 80 MG tablet     . BD INSULIN SYRINGE ULTRAFINE 31G X 15/64" 0.3 ML MISC     . cholecalciferol (VITAMIN D) 1000 UNITS tablet Take 1,000 Units by mouth daily.     . clindamycin (CLEOCIN) 300 MG capsule Take 1 capsule (300 mg total) by mouth 3 (three) times daily. 30 capsule 0  . clotrimazole-betamethasone (LOTRISONE) cream     . cyclobenzaprine (FLEXERIL) 10 MG tablet Take 10 mg by mouth at bedtime.     . diclofenac sodium (VOLTAREN) 1 % GEL 3 g to 3 larges joints up to three times daily (Patient taking differently: Apply 2 g topically 3 (three) times daily. 3 g to 3 larges joints up to three times daily) 1 Tube 3  . docusate sodium (COLACE) 100 MG capsule Take 1 capsule (100 mg total) by mouth 2 (two) times daily. 10 capsule 0  . ferrous sulfate (FEOSOL) 325 (65 FE) MG tablet Take 325-650 mg by mouth  daily with breakfast.    . folic acid (FOLVITE) 1 MG tablet Take 1 mg by mouth daily.    Marland Kitchen HYDROcodone-acetaminophen (NORCO) 5-325 MG tablet Take 1 tablet by mouth every 6 (six) hours as needed for moderate pain. 21 tablet 0  . insulin NPH-regular Human (NOVOLIN 70/30) (70-30) 100 UNIT/ML injection Inject 6-8 Units into the skin 3 (three) times daily with meals. Sliding source    . leflunomide (ARAVA) 10 MG tablet Take 1 tablet PO every other day for two weeks then increase to 1 tablet PO daily if labs are stable. 23  tablet 0  . levothyroxine (SYNTHROID, LEVOTHROID) 150 MCG tablet Take 1 tablet (150 mcg total) by mouth daily before breakfast. (Patient taking differently: Take 175 mcg by mouth daily before breakfast. )    . levothyroxine (SYNTHROID, LEVOTHROID) 175 MCG tablet     . losartan (COZAAR) 50 MG tablet Take 50 mg by mouth every evening.    . multivitamin (THERAGRAN) per tablet Take 1 tablet by mouth daily.     . pregabalin (LYRICA) 75 MG capsule 1 bid 54 capsule 0  . promethazine (PHENERGAN) 12.5 MG tablet Take 1 tablet (12.5 mg total) by mouth every 6 (six) hours as needed for nausea or vomiting. 30 tablet 0  . VITAMIN E PO Take 1 tablet by mouth daily.    Marland Kitchen allopurinol (ZYLOPRIM) 300 MG tablet Take 1 tablet (300 mg total) by mouth daily. 30 tablet 5   Current Facility-Administered Medications on File Prior to Visit  Medication Dose Route Frequency Provider Last Rate Last Dose  . triamcinolone acetonide (KENALOG) 10 MG/ML injection 10 mg  10 mg Other Once Landis Martins, DPM      . triamcinolone acetonide (KENALOG) 10 MG/ML injection 10 mg  10 mg Other Once Landis Martins, DPM        Allergies  Allergen Reactions  . Methotrexate Derivatives Other (See Comments)    Elevated creat. Levels  . Other Other (See Comments)    NO Blood products.  [name]- causes low blood sugar (pt states she was taking medication but cant remember what it was for.)  . Penicillins Rash    Has patient had a PCN reaction causing immediate rash, facial/tongue/throat swelling, SOB or lightheadedness with hypotension: Yes Has patient had a PCN reaction causing severe rash involving mucus membranes or skin necrosis: Yes  Has patient had a PCN reaction that required hospitalization No Has patient had a PCN reaction occurring within the last 10 years: No If all of the above answers are "NO", then may proceed with Cephalosporin use.   . Remicade [Infliximab] Other (See Comments)    Inadequate response   . Zocor  [Simvastatin] Other (See Comments)    Leg cramps     Objective: There were no vitals filed for this visit.  General: No acute distress, AAOx3  Left foot: There is a preulcerative lesion/callus at IPJ at left 4th toe, Incision at plantar toe is well healed at surgical site, mild swelling to left foot at 4th toe, no erythema, no warmth, no drainage, no signs of infection noted, + maceration in webspaces, Capillary fill time <3 seconds in all digits, gross sensation present via light touch to left foot. No pain or crepitation with range of motion left foot.  No pain with calf compression.  Right foot: Minimal corn at 4th toe distal tuft with no acute signs of infection.   Assessment and Plan:  Problem List Items Addressed This Visit  None    Visit Diagnoses    Pre-ulcerative calluses    -  Primary   Skin maceration       S/P foot surgery, left       Type 2 diabetes mellitus with diabetic neuropathy, with long-term current use of insulin ([place])         -Patient seen and evaluated -Applied antibiotic cream and bandaid to left 4th toe and advised patient to do the same -Applied betadine to webspaces and encouraged patient to dry well in between toes -Continue with good supportive shoes and toe cushions as needed  -Will plan for nail and callus care at next office visit. Patient will let me know when she decides on tenotomy on right 4th toe.  In the meantime, patient to call office if any issues or problems arise.   Landis Martins, DPM

## 2016-09-10 ENCOUNTER — Ambulatory Visit: Payer: Medicare Other | Admitting: Internal Medicine

## 2016-09-12 ENCOUNTER — Other Ambulatory Visit (HOSPITAL_COMMUNITY): Payer: Self-pay | Admitting: *Deleted

## 2016-09-15 ENCOUNTER — Ambulatory Visit (HOSPITAL_COMMUNITY)
Admission: RE | Admit: 2016-09-15 | Discharge: 2016-09-15 | Disposition: A | Discharge status: Home / Self Care | Payer: Medicare Other | Source: Ambulatory Visit | Attending: Rheumatology | Admitting: Rheumatology

## 2016-09-15 DIAGNOSIS — M0579 Rheumatoid arthritis with rheumatoid factor of multiple sites without organ or systems involvement: Secondary | ICD-10-CM | POA: Diagnosis not present

## 2016-09-15 MED ORDER — DIPHENHYDRAMINE HCL 25 MG PO CAPS
ORAL_CAPSULE | ORAL | Status: AC
  Filled 2016-09-15: qty 1

## 2016-09-15 MED ORDER — SODIUM CHLORIDE 0.9 % IV SOLN
1000.0000 mg | INTRAVENOUS | Status: AC
  Administered 2016-09-15: 13:00:00 1000 mg via INTRAVENOUS
  Filled 2016-09-15: qty 40

## 2016-09-15 MED ORDER — DIPHENHYDRAMINE HCL 25 MG PO CAPS
25.0000 mg | ORAL_CAPSULE | ORAL | Status: DC
  Administered 2016-09-15: 25 mg via ORAL

## 2016-09-15 MED ORDER — ACETAMINOPHEN 325 MG PO TABS: 650.0000 mg | ORAL_TABLET | ORAL | Status: DC

## 2016-09-22 ENCOUNTER — Ambulatory Visit: Payer: Medicare Other | Admitting: Adult Health

## 2016-09-23 DIAGNOSIS — R921 Mammographic calcification found on diagnostic imaging of breast: Secondary | ICD-10-CM | POA: Diagnosis not present

## 2016-09-23 DIAGNOSIS — N6322 Unspecified lump in the left breast, upper inner quadrant: Secondary | ICD-10-CM | POA: Diagnosis not present

## 2016-09-23 DIAGNOSIS — Z780 Asymptomatic menopausal state: Secondary | ICD-10-CM | POA: Diagnosis not present

## 2016-09-25 DIAGNOSIS — M1711 Unilateral primary osteoarthritis, right knee: Secondary | ICD-10-CM | POA: Insufficient documentation

## 2016-09-25 NOTE — Progress Notes (Deleted)
Office Visit Note  Patient: [name]             Date of Birth: 1940/12/15           MRN: [id number]             PCP: Jonathon Jordan, MD Referring: Jonathon Jordan, MD Visit Date: 10/07/2016 Occupation: @GUAROCC @    Subjective:  No chief complaint on file.   History of Present Illness: [name] is a 76 y.o. female ***   Activities of Daily Living:  Patient reports morning stiffness for *** {minute/hour:19697}.   Patient {ACTIONS;DENIES/REPORTS:21021675::"Denies"} nocturnal pain.  Difficulty dressing/grooming: {ACTIONS;DENIES/REPORTS:21021675::"Denies"} Difficulty climbing stairs: {ACTIONS;DENIES/REPORTS:21021675::"Denies"} Difficulty getting out of chair: {ACTIONS;DENIES/REPORTS:21021675::"Denies"} Difficulty using hands for taps, buttons, cutlery, and/or writing: {ACTIONS;DENIES/REPORTS:21021675::"Denies"}   No Rheumatology ROS completed.   PMFS History:  Patient Active Problem List   Diagnosis Date Noted  . Primary osteoarthritis of right knee 09/25/2016  . 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 Elevated d-dimer   . Encephalopathy   . Sinus bradycardia   . Acute encephalopathy 02/27/2016  . Seropositive rheumatoid arthritis of multiple sites ([place]) 01/17/2016  . Bilateral hand pain 01/17/2016  . Bilateral wrist pain 01/17/2016  . Pain in right elbow 01/17/2016  . Left shoulder pain 01/17/2016  . Encounter for therapeutic drug monitoring 11/02/2015  . Bronchiectasis without complication ([place]) 64/33/2951  . Chronic gout involving toe without tophus 10/30/2015  . High risk medication use 10/29/2015  . Gout 10/29/2015  . H/O total knee replacement, left 10/29/2015  . History of positive PPD 10/29/2015  . Diabetes mellitus, type II, insulin dependent ([place]) 10/18/2015  . Rheumatoid arthritis flare ([place]) 10/18/2015  . AKI (acute kidney injury) ([place]) 10/18/2015  . CKD (chronic kidney disease) stage 3, GFR 30-59 ml/min  10/18/2015  . Nodule of left lung 10/18/2015  . Neuropathy 08/22/2014  . Hypothyroidism 08/22/2014  . Chronic pain 08/22/2014  . Chronic congestive heart failure with left ventricular diastolic dysfunction ([place]) 08/08/2013  . Chest pain 08/05/2013  . Aortic stenosis 12/22/2012  . Hyperlipidemia 12/22/2012  . PVC (premature ventricular contraction) 12/22/2012  . HTN (hypertension) 12/22/2012  . 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 Open angle with borderline findings, high risk 12/15/2011  . Cataract, nuclear 05/25/2011    Past Medical History:  Diagnosis Date  . Allergic rhinitis   . Anemia   . Aortic stenosis 12/22/2012   Mild 3/14-2.35 m/s peak velocity  . CHF (congestive heart failure) ([place])   . Chronic pain   . CKD (chronic kidney disease), stage III   . Diabetes mellitus   . Diverticulosis   . DVT (deep venous thrombosis) ([place])   . Glaucoma   . Hemorrhoids    internal  . HTN (hypertension)   . Hypercholesteremia   . Hyperlipidemia 12/22/2012  . Hypothyroidism   . Meningioma ([place]) 2008  . Osteoarthritis   . Peripheral neuropathy   . PPD positive    6 months ago  . PVC (premature ventricular contraction) 12/22/2012  . Rheumatoid arthritis(714.0)   . Sickle cell trait ([place])   . 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 Hypotension Mother   . CVA Mother   . Diabetes Mother   . Hypertension Father   . Kidney failure Father    Past Surgical History:  Procedure Laterality Date  . ABDOMINAL HYSTERECTOMY  1978  TAH,& BSO 1  . fistula repair  03/2010   partial colectomy  . intracranial surgery  11/2007   removal of meningioma  . JOINT REPLACEMENT  2001   left TKR  . LEFT HEART CATHETERIZATION WITH CORONARY ANGIOGRAM N/A 08/08/2013   Procedure: LEFT HEART CATHETERIZATION WITH CORONARY ANGIOGRAM;  Surgeon: Peter M Martinique, MD;  Location: [facility] CATH LAB;  Service:  Cardiovascular;  Laterality: N/A;  . ROTATOR CUFF REPAIR  1999   BIlateral  . SIGMOIDECTOMY  2012  . TONSILLECTOMY AND ADENOIDECTOMY    . TOTAL THYROIDECTOMY  1992  . 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    Investigation: No additional findings. CBC Latest Ref Rng & Units 08/18/2016 06/09/2016 04/14/2016  WBC 4.0 - 10.5 K/uL 7.6 9.2 7.3  Hemoglobin 12.0 - 15.0 g/dL 10.5(L) 10.0(L) 11.1(L)  Hematocrit 36.0 - 46.0 % 31.7(L) 30.4(L) 33.6(L)  Platelets 150 - 400 K/uL 330 289 333     CMP Latest Ref Rng & Units 08/18/2016 06/09/2016 04/14/2016  Glucose 65 - 99 mg/dL 133(H) 97 144(H)  BUN 6 - 20 mg/dL 14 14 14   Creatinine 0.44 - 1.00 mg/dL 1.42(H) 1.64(H) 1.33(H)  Sodium 135 - 145 mmol/L 139 140 138  Potassium 3.5 - 5.1 mmol/L 4.2 4.6 5.2(H)  Chloride 101 - 111 mmol/L 106 108 103  CO2 22 - 32 mmol/L 22 23 26   Calcium 8.9 - 10.3 mg/dL 9.8 9.2 9.5  Total Protein 6.5 - 8.1 g/dL 7.7 7.0 7.4  Total Bilirubin 0.3 - 1.2 mg/dL 0.9 0.7 1.5(H)  Alkaline Phos 38 - 126 U/L 80 91 68  AST 15 - 41 U/L 26 25 31   ALT 14 - 54 U/L 13(L) 13(L) 10(L)    Imaging: No results found.  Speciality Comments: No specialty comments available.    Procedures:  No procedures performed Allergies: Methotrexate derivatives; Other; Penicillins; Remicade [infliximab]; and Zocor [simvastatin]   Assessment / Plan:     Visit Diagnoses: Seropositive rheumatoid arthritis of multiple sites ([place])  High risk medication use - Orencia and Arava   Primary osteoarthritis of right knee  H/O total knee replacement, left  Idiopathic chronic gout of multiple sites without tophus  History of chronic heart failure  History of aortic stenosis  History of hypothyroidism  History of positive PPD  treated with INH   History of hyperlipidemia  History of chronic kidney disease  History of hemangioma brain  History of thrombosis brachial right arm     Orders: No orders of the defined types were placed in this encounter.  No orders of the defined types were placed in this encounter.   Face-to-face time spent with patient was *** minutes. 50% of time was spent in counseling and coordination of care.  Follow-Up Instructions: No Follow-up on file.   Shaday Rayborn, RT  Note - This record has been created using [employer].  Chart creation errors have been sought, but may not always  have been located. Such creation errors do not reflect on  the standard of medical care.

## 2016-09-26 DIAGNOSIS — Z23 Encounter for immunization: Secondary | ICD-10-CM | POA: Diagnosis not present

## 2016-09-29 ENCOUNTER — Other Ambulatory Visit: Payer: Self-pay | Admitting: Rheumatology

## 2016-09-29 DIAGNOSIS — H2513 Age-related nuclear cataract, bilateral: Secondary | ICD-10-CM | POA: Diagnosis not present

## 2016-09-29 DIAGNOSIS — M0579 Rheumatoid arthritis with rheumatoid factor of multiple sites without organ or systems involvement: Secondary | ICD-10-CM

## 2016-09-29 DIAGNOSIS — H40023 Open angle with borderline findings, high risk, bilateral: Secondary | ICD-10-CM | POA: Diagnosis not present

## 2016-09-29 DIAGNOSIS — R7612 Nonspecific reaction to cell mediated immunity measurement of gamma interferon antigen response without active tuberculosis: Secondary | ICD-10-CM

## 2016-09-29 NOTE — Progress Notes (Signed)
Infusion orders updated today including CBC CMP Tylenol and Benadryl appointments are up to date and a follow up appointment is scheduled/ TB gold has been positive in the past so she has yearly chest xrays, this was done in Feb

## 2016-09-30 ENCOUNTER — Ambulatory Visit (INDEPENDENT_AMBULATORY_CARE_PROVIDER_SITE_OTHER): Payer: Medicare Other | Admitting: Rheumatology

## 2016-09-30 ENCOUNTER — Encounter: Payer: Self-pay | Admitting: Rheumatology

## 2016-09-30 VITALS — BP 130/69 | HR 72 | Resp 14 | Ht 63.0 in | Wt 186.0 lb

## 2016-09-30 DIAGNOSIS — Z794 Long term (current) use of insulin: Secondary | ICD-10-CM

## 2016-09-30 DIAGNOSIS — Z79899 Other long term (current) drug therapy: Secondary | ICD-10-CM | POA: Diagnosis not present

## 2016-09-30 DIAGNOSIS — E782 Mixed hyperlipidemia: Secondary | ICD-10-CM

## 2016-09-30 DIAGNOSIS — R7612 Nonspecific reaction to cell mediated immunity measurement of gamma interferon antigen response without active tuberculosis: Secondary | ICD-10-CM | POA: Diagnosis not present

## 2016-09-30 DIAGNOSIS — Z96652 Presence of left artificial knee joint: Secondary | ICD-10-CM

## 2016-09-30 DIAGNOSIS — M51369 Other intervertebral disc degeneration, lumbar region without mention of lumbar back pain or lower extremity pain: Secondary | ICD-10-CM

## 2016-09-30 DIAGNOSIS — M069 Rheumatoid arthritis, unspecified: Secondary | ICD-10-CM | POA: Diagnosis not present

## 2016-09-30 DIAGNOSIS — I5032 Chronic diastolic (congestive) heart failure: Secondary | ICD-10-CM

## 2016-09-30 DIAGNOSIS — I10 Essential (primary) hypertension: Secondary | ICD-10-CM | POA: Diagnosis not present

## 2016-09-30 DIAGNOSIS — M5136 Other intervertebral disc degeneration, lumbar region: Secondary | ICD-10-CM | POA: Diagnosis not present

## 2016-09-30 DIAGNOSIS — N183 Chronic kidney disease, stage 3 unspecified: Secondary | ICD-10-CM

## 2016-09-30 DIAGNOSIS — M1711 Unilateral primary osteoarthritis, right knee: Secondary | ICD-10-CM

## 2016-09-30 DIAGNOSIS — M1A9XX0 Chronic gout, unspecified, without tophus (tophi): Secondary | ICD-10-CM | POA: Diagnosis not present

## 2016-09-30 DIAGNOSIS — E119 Type 2 diabetes mellitus without complications: Secondary | ICD-10-CM | POA: Diagnosis not present

## 2016-09-30 DIAGNOSIS — I639 Cerebral infarction, unspecified: Secondary | ICD-10-CM

## 2016-09-30 MED ORDER — HYDROXYCHLOROQUINE SULFATE 200 MG PO TABS: 200.0000 mg | ORAL_TABLET | Freq: Every day | ORAL | 2 refills | Status: DC

## 2016-09-30 MED ORDER — PREDNISONE 5 MG PO TABS: ORAL_TABLET | ORAL | 0 refills | Status: DC

## 2016-09-30 MED ORDER — PREGABALIN 75 MG PO CAPS: ORAL_CAPSULE | ORAL | 0 refills | Status: AC

## 2016-09-30 NOTE — Progress Notes (Signed)
Office Visit Note  Patient: [name]             Date of Birth: 12-07-1940           MRN: [id number]             PCP: Jonathon Jordan, MD Referring: Jonathon Jordan, MD Visit Date: 09/30/2016 Occupation: @GUAROCC @    Subjective:  Pain in multiple joints.   History of Present Illness: [name] is a 76 y.o. female with history of rheumatoid arthritis and gout. She states she continues to have pain and discomfort in her multiple joints. She describes pain in her bilateral shoulders, bilateral elbows, bilateral hands and bilateral knee joints. She has not had a gout attack since the last visit. She states that she tried [place] for 1 month but did not notice any improvement in her symptoms. Due to the cost of the medication she discontinued the medication. She's been on monotherapy of Orencia IV.  Activities of Daily Living:  Patient reports morning stiffness for 2  hours.   Patient Reports nocturnal pain.  Difficulty dressing/grooming: Reports Difficulty climbing stairs: Reports Difficulty getting out of chair: Reports Difficulty using hands for taps, buttons, cutlery, and/or writing: Reports   Review of Systems  Constitutional: Positive for fatigue. Negative for night sweats, weight gain, weight loss and weakness.  HENT: Positive for mouth dryness. Negative for ear pain, mouth sores, trouble swallowing, trouble swallowing and nose dryness.   Eyes: Positive for dryness. Negative for pain, redness and visual disturbance.  Respiratory: Negative.  Negative for cough, shortness of breath and difficulty breathing.   Cardiovascular: Negative.  Positive for hypertension. Negative for chest pain, palpitations, irregular heartbeat and swelling in legs/feet.  Gastrointestinal: Negative.  Negative for blood in stool, constipation and diarrhea.  Endocrine: Negative.  Negative for increased urination.  Genitourinary: Positive for nocturia. Negative for vaginal dryness.    Musculoskeletal: Positive for arthralgias, joint pain, joint swelling, myalgias, morning stiffness and myalgias. Negative for muscle weakness and muscle tenderness.  Skin: Negative.  Negative for color change, rash, hair loss, skin tightness, ulcers and sensitivity to sunlight.  Allergic/Immunologic: Negative for susceptible to infections.  Neurological: Negative for dizziness, headaches, memory loss and night sweats.  Hematological: Negative.  Negative for swollen glands.  Psychiatric/Behavioral: Positive for sleep disturbance. Negative for depressed mood. The patient is nervous/anxious.     PMFS History:  Patient Active Problem List   Diagnosis Date Noted  . Primary osteoarthritis of right knee 09/25/2016  . 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 Elevated d-dimer   . Encephalopathy   . Sinus bradycardia   . Acute encephalopathy 02/27/2016  . Seropositive rheumatoid arthritis of multiple sites ([place]) 01/17/2016  . Bilateral hand pain 01/17/2016  . Bilateral wrist pain 01/17/2016  . Pain in right elbow 01/17/2016  . Left shoulder pain 01/17/2016  . Encounter for therapeutic drug monitoring 11/02/2015  . Bronchiectasis without complication ([place]) 84/53/6468  . Chronic gout involving toe without tophus 10/30/2015  . High risk medication use 10/29/2015  . Gout 10/29/2015  . H/O total knee replacement, left 10/29/2015  . 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 Diabetes mellitus, type II, insulin dependent ([place]) 10/18/2015  . Rheumatoid arthritis flare ([place]) 10/18/2015  . AKI (acute kidney injury) ([place]) 10/18/2015  . CKD (chronic kidney disease) stage 3, GFR 30-59 ml/min 10/18/2015  . Nodule of left lung 10/18/2015  . Neuropathy 08/22/2014  . Hypothyroidism 08/22/2014  .  Chronic pain 08/22/2014  . Chronic congestive heart failure with left ventricular diastolic dysfunction ([place]) 08/08/2013  . Chest pain 08/05/2013  .  Aortic stenosis 12/22/2012  . Hyperlipidemia 12/22/2012  . PVC (premature ventricular contraction) 12/22/2012  . HTN (hypertension) 12/22/2012  . 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 Open angle with borderline findings, high risk 12/15/2011  . Cataract, nuclear 05/25/2011    Past Medical History:  Diagnosis Date  . Allergic rhinitis   . Anemia   . Aortic stenosis 12/22/2012   Mild 3/14-2.35 m/s peak velocity  . Cataract 2018  . CHF (congestive heart failure) ([place])   . Chronic pain   . CKD (chronic kidney disease), stage III   . Diabetes mellitus   . Diverticulosis   . DVT (deep venous thrombosis) ([place])   . Glaucoma   . Hemorrhoids    internal  . HTN (hypertension)   . Hypercholesteremia   . Hyperlipidemia 12/22/2012  . Hypothyroidism   . Meningioma ([place]) 2008  . Osteoarthritis   . Peripheral neuropathy   . PPD positive    6 months ago  . PVC (premature ventricular contraction) 12/22/2012  . Rheumatoid arthritis(714.0)   . Sickle cell trait ([place])   . 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 Hypotension Mother   . CVA Mother   . Diabetes Mother   . Hypertension Father   . Kidney failure Father    Past Surgical History:  Procedure Laterality Date  . ABDOMINAL HYSTERECTOMY  1978   TAH,& BSO 1  . fistula repair  03/2010   partial colectomy  . intracranial surgery  11/2007   removal of meningioma  . JOINT REPLACEMENT  2001   left TKR  . LEFT HEART CATHETERIZATION WITH CORONARY ANGIOGRAM N/A 08/08/2013   Procedure: LEFT HEART CATHETERIZATION WITH CORONARY ANGIOGRAM;  Surgeon: Peter M Martinique, MD;  Location: [facility] CATH LAB;  Service: Cardiovascular;  Laterality: N/A;  . ROTATOR CUFF REPAIR  1999   BIlateral  . SIGMOIDECTOMY  2012  . TOE SURGERY Left 2018  . TONSILLECTOMY AND ADENOIDECTOMY    . TOTAL THYROIDECTOMY  1992  . 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 She appears well-developed and well-nourished.  HENT:  Head: Normocephalic and atraumatic.  Eyes: Conjunctivae and EOM are normal.  Neck: Normal range of motion.  Cardiovascular: Normal rate, regular rhythm, normal heart sounds and intact distal pulses.   Pulmonary/Chest: Effort normal and breath sounds normal.  Abdominal: Soft. Bowel sounds are normal.  Lymphadenopathy:    She has no cervical adenopathy.  Neurological: She is alert and oriented to person, place, and time.  Skin: Skin is warm and dry. Capillary refill takes less than 2 seconds.  Psychiatric: She has a normal mood and affect. Her behavior is normal.  Nursing note and vitals reviewed.    Musculoskeletal Exam: C-spine and thoracic spine good range of motion. She  has discomfort range of motion of her lumbar spine. Shoulder joints discomfort range of motion. She has discomfort range of motion of her elbow joints. Synovitis was noted in her MCPs as described below. She has some warmth in her bilateral knee joints. Her left knee joint is replaced. She is some osteoarthritis and tenderness across MTP joints.  CDAI Exam: CDAI Homunculus Exam:   Tenderness:  RUE: glenohumeral and ulnohumeral and radiohumeral LUE: glenohumeral and ulnohumeral and radiohumeral Right hand: 1st MCP and 2nd MCP Left hand: 2nd MCP RLE: tibiofemoral LLE: tibiofemoral  Swelling:  Right hand: 1st MCP and 2nd MCP Left hand: 2nd MCP RLE: tibiofemoral  Joint Counts:  CDAI Tender Joint count: 9 CDAI Swollen Joint count: 4  Global Assessments:  Patient Global Assessment: 6 Provider Global Assessment: 6  CDAI Calculated Score:  25    Investigation: No additional findings. CBC Latest Ref Rng & Units 08/18/2016 06/09/2016 04/14/2016  WBC 4.0 - 10.5 K/uL 7.6 9.2 7.3  Hemoglobin 12.0 - 15.0 g/dL 10.5(L) 10.0(L) 11.1(L)  Hematocrit 36.0 - 46.0 % 31.7(L) 30.4(L) 33.6(L)  Platelets 150 - 400 K/uL 330 289 333   CMP     Component Value Date/Time   NA 139 08/18/2016 1227   K 4.2 08/18/2016 1227   CL 106 08/18/2016 1227   CO2 22 08/18/2016 1227   GLUCOSE 133 (H) 08/18/2016 1227   BUN 14 08/18/2016 1227   CREATININE 1.42 (H) 08/18/2016 1227   CALCIUM 9.8 08/18/2016 1227   PROT 7.7 08/18/2016 1227   ALBUMIN 3.9 08/18/2016 1227   AST 26 08/18/2016 1227   ALT 13 (L) 08/18/2016 1227   ALKPHOS 80 08/18/2016 1227   BILITOT 0.9 08/18/2016 1227   GFRNONAA 35 (L) 08/18/2016 1227   GFRAA 40 (L) 08/18/2016 1227   Imaging: No results found.  Speciality Comments: Orencia 750mg  IV  every 4 weeks TB gold has been positive in the past, patient has yearly chest xrays    Procedures:  No procedures performed Allergies: Methotrexate derivatives; Other; Penicillins; Remicade [infliximab]; and Zocor [simvastatin]   Assessment / Plan:     Visit Diagnoses: Rheumatoid arthritis involving multiple sites, unspecified rheumatoid factor presence ([place]): She continues to have pain and discomfort and active synovitis in her hands. She is on IV Orencia. She had elevated creatinine and elevated weighted LFTs on methotrexate in the past. She recently tried [place] and discontinued due to the cost. She did discontinue Plaquenil in the past due to the cost.I had detailed discussion with the patient today. She wants to try Plaquenil again. I will apply for Plaquenil. Handout was given consent was taken. I will call and Plaquenil 200 mg by mouth daily. The need for regular eye exam was emphasized. She was given a form to get baseline eye exam and then yearly eye exam. She also requested some prednisone . Side effects of prednisone were discussed at  length. She was given a prescription for prednisone 5 mg by mouth every morning for 2 weeks and then 2.5 mg by mouth every morning for 2 weeks.  High risk medication use - Orencia IV( Arava stopped it due to the cost, methotrexate caused elevated creatinine and LFTs,Remicade-inadequate response). Her GFR is low but stable.  chronic gout - allopurinol300 mg by mouth daily. Patient has not had any gout flares.  Primary osteoarthritis of right knee: Chronic pain  H/O total knee replacement, left: She continues to have discomfort despite total nail placement.  Positive QuantiFERON-TB Gold test/ patient has yearly  chest xrays  - chest x-ray due in February 2019  DDD (degenerative disc disease), lumbar: She continues to have pain. I've advised her to follow-up with her back specialist.  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  Meds ordered this encounter  Medications  . hydroxychloroquine (PLAQUENIL) 200 MG tablet    Sig: Take 1 tablet (200 mg total) by mouth daily.    Dispense:  30 tablet    Refill:  2  . predniSONE (DELTASONE) 5 MG tablet    Sig: 1 tablet by mouth every morning  For 2 weeks then half tablet by mouth every morning for 2 weeks.    Dispense:  21 tablet    Refill:  0  . pregabalin (LYRICA) 75 MG capsule    Sig: 1 bid    Dispense:  28 capsule    Refill:  0    Order Specific Question:   Lot Number?    Answer:   [id number]    Comments:   [id number]    Order Specific Question:   Expiration Date?    Answer:   11/06/2017    Comments:   06/07/2018    Order Specific Question:   Quantity    Answer:   1    Comments:   3    Face-to-face time spent with patient was 30 minutes. Greater than50% of time was spent  in counseling and coordination of care.  Follow-Up Instructions: Return in about 3 months (around 12/30/2016) for Rheumatoid arthritis, Gout.   Bo Merino, MD  Note - This record has been created using [employer].  Chart creation errors have been sought, but may not always  have been located. Such creation errors do not reflect on  the standard of medical care.

## 2016-09-30 NOTE — Patient Instructions (Signed)

## 2016-10-01 ENCOUNTER — Ambulatory Visit: Payer: Medicare Other | Admitting: Adult Health

## 2016-10-01 ENCOUNTER — Other Ambulatory Visit: Payer: Self-pay | Admitting: Rheumatology

## 2016-10-07 ENCOUNTER — Ambulatory Visit: Payer: Medicare Other | Admitting: Rheumatology

## 2016-10-10 ENCOUNTER — Other Ambulatory Visit (HOSPITAL_COMMUNITY): Payer: Self-pay | Admitting: *Deleted

## 2016-10-10 ENCOUNTER — Other Ambulatory Visit (HOSPITAL_COMMUNITY): Payer: Self-pay

## 2016-10-13 ENCOUNTER — Ambulatory Visit (HOSPITAL_COMMUNITY)
Admission: RE | Admit: 2016-10-13 | Discharge: 2016-10-13 | Disposition: A | Discharge status: Home / Self Care | Payer: Medicare Other | Source: Ambulatory Visit | Attending: Rheumatology | Admitting: Rheumatology

## 2016-10-13 DIAGNOSIS — D649 Anemia, unspecified: Secondary | ICD-10-CM | POA: Diagnosis not present

## 2016-10-13 DIAGNOSIS — M0579 Rheumatoid arthritis with rheumatoid factor of multiple sites without organ or systems involvement: Secondary | ICD-10-CM

## 2016-10-13 LAB — CBC
HEMATOCRIT: 30.7 % — AB (ref 36.0–46.0)
Hemoglobin: 9.7 g/dL — ABNORMAL LOW (ref 12.0–15.0)
MCH: 27.6 pg (ref 26.0–34.0)
MCHC: 31.6 g/dL (ref 30.0–36.0)
MCV: 87.2 fL (ref 78.0–100.0)
Platelets: 309 10*3/uL (ref 150–400)
RBC: 3.52 MIL/uL — AB (ref 3.87–5.11)
RDW: 16.6 % — ABNORMAL HIGH (ref 11.5–15.5)
WBC: 8 10*3/uL (ref 4.0–10.5)

## 2016-10-13 LAB — URIC ACID: URIC ACID, SERUM: 5.6 mg/dL (ref 2.3–6.6)

## 2016-10-13 LAB — COMPREHENSIVE METABOLIC PANEL
ALBUMIN: 3.7 g/dL (ref 3.5–5.0)
ALT: 12 U/L — AB (ref 14–54)
AST: 18 U/L (ref 15–41)
Alkaline Phosphatase: 85 U/L (ref 38–126)
Anion gap: 10 (ref 5–15)
BILIRUBIN TOTAL: 0.8 mg/dL (ref 0.3–1.2)
BUN: 14 mg/dL (ref 6–20)
CHLORIDE: 110 mmol/L (ref 101–111)
CO2: 23 mmol/L (ref 22–32)
CREATININE: 1.28 mg/dL — AB (ref 0.44–1.00)
Calcium: 9.3 mg/dL (ref 8.9–10.3)
GFR calc Af Amer: 46 mL/min — ABNORMAL LOW (ref >60)
GFR, EST NON AFRICAN AMERICAN: 39 mL/min — AB (ref >60)
GLUCOSE: 68 mg/dL (ref 65–99)
POTASSIUM: 3.5 mmol/L (ref 3.5–5.1)
Sodium: 143 mmol/L (ref 135–145)
TOTAL PROTEIN: 7 g/dL (ref 6.5–8.1)

## 2016-10-13 MED ORDER — SODIUM CHLORIDE 0.9 % IV SOLN
750.0000 mg | INTRAVENOUS | Status: DC
  Administered 2016-10-13: 750 mg via INTRAVENOUS
  Filled 2016-10-13: qty 30

## 2016-10-13 MED ORDER — DIPHENHYDRAMINE HCL 25 MG PO CAPS
ORAL_CAPSULE | ORAL | Status: AC
  Filled 2016-10-13: qty 1

## 2016-10-13 MED ORDER — ACETAMINOPHEN 325 MG PO TABS
ORAL_TABLET | ORAL | Status: AC
  Filled 2016-10-13: qty 2

## 2016-10-13 MED ORDER — ACETAMINOPHEN 325 MG PO TABS
650.0000 mg | ORAL_TABLET | ORAL | Status: AC
  Administered 2016-10-13: 13:00:00 650 mg via ORAL

## 2016-10-13 MED ORDER — DIPHENHYDRAMINE HCL 25 MG PO CAPS
25.0000 mg | ORAL_CAPSULE | ORAL | Status: AC
  Administered 2016-10-13: 13:00:00 25 mg via ORAL

## 2016-10-13 NOTE — Progress Notes (Signed)
Labs are stable . Anemia persists- please forward labs to her PCP and notify Pt. uric acid is in desirable range

## 2016-10-14 ENCOUNTER — Ambulatory Visit (INDEPENDENT_AMBULATORY_CARE_PROVIDER_SITE_OTHER): Payer: Medicare Other | Admitting: Sports Medicine

## 2016-10-14 ENCOUNTER — Encounter: Payer: Self-pay | Admitting: Sports Medicine

## 2016-10-14 VITALS — BP 135/63 | HR 68 | Resp 16

## 2016-10-14 DIAGNOSIS — M79674 Pain in right toe(s): Secondary | ICD-10-CM | POA: Diagnosis not present

## 2016-10-14 DIAGNOSIS — M79675 Pain in left toe(s): Secondary | ICD-10-CM | POA: Diagnosis not present

## 2016-10-14 DIAGNOSIS — E1140 Type 2 diabetes mellitus with diabetic neuropathy, unspecified: Secondary | ICD-10-CM | POA: Diagnosis not present

## 2016-10-14 DIAGNOSIS — L84 Corns and callosities: Secondary | ICD-10-CM | POA: Diagnosis not present

## 2016-10-14 DIAGNOSIS — Z794 Long term (current) use of insulin: Secondary | ICD-10-CM

## 2016-10-14 DIAGNOSIS — B351 Tinea unguium: Secondary | ICD-10-CM | POA: Diagnosis not present

## 2016-10-14 NOTE — Progress Notes (Signed)
Patient ID: [name], female   DOB: [birth year], 76 y.o.   MRN: [id number]  Subjective: [name] is a 76 y.o. female patient with history of type 2 diabetes who returns to office today complaining of long, painful nails and callus while ambulating in shoes; unable to trim. Patient states that the glucose reading this morning was not recorded but good. Patient admits increasing pain to right 4th toe like how it use to be on the left. Patient is s/p tenotomy in office and is doing well on left.   Patient Active Problem List   Diagnosis Date Noted  . Primary osteoarthritis of right knee 09/25/2016  . 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 Elevated d-dimer   . Encephalopathy   . Sinus bradycardia   . Acute encephalopathy 02/27/2016  . Seropositive rheumatoid arthritis of multiple sites ([place]) 01/17/2016  . Bilateral hand pain 01/17/2016  . Bilateral wrist pain 01/17/2016  . Pain in right elbow 01/17/2016  . Left shoulder pain 01/17/2016  . Encounter for therapeutic drug monitoring 11/02/2015  . Bronchiectasis without complication ([place]) 09/28/3005  . Chronic gout involving toe without tophus 10/30/2015  . High risk medication use 10/29/2015  . Gout 10/29/2015  . H/O total knee replacement, left 10/29/2015  . 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 Diabetes mellitus, type II, insulin dependent ([place]) 10/18/2015  . Rheumatoid arthritis flare ([place]) 10/18/2015  . AKI (acute kidney injury) ([place]) 10/18/2015  . CKD (chronic kidney disease) stage 3, GFR 30-59 ml/min (H[facility]) 10/18/2015  . Nodule of left lung 10/18/2015  . Neuropathy 08/22/2014  . Hypothyroidism 08/22/2014  . Chronic pain 08/22/2014  . Chronic congestive heart failure with left ventricular diastolic dysfunction ([place]) 08/08/2013  . Chest pain 08/05/2013  . Aortic stenosis 12/22/2012  . Hyperlipidemia 12/22/2012  . PVC (premature ventricular contraction)  12/22/2012  . HTN (hypertension) 12/22/2012  . 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 Open angle with borderline findings, high risk 12/15/2011  . Cataract, nuclear 05/25/2011   Current Outpatient Prescriptions on File Prior to Visit  Medication Sig Dispense Refill  . Abatacept (ORENCIA IV) Inject into the vein. Daughters are not sure of doses. Received in the short stay area once a month    . acetaminophen (TYLENOL) 500 MG tablet Take 1,000 mg by mouth every 6 (six) hours as needed for mild pain.    Marland Kitchen aspirin EC 81 MG EC tablet Take 1 tablet (81 mg total) by mouth daily. 30 tablet 0  . atorvastatin (LIPITOR) 40 MG tablet Take 40 mg by mouth daily at 6 PM.     . atorvastatin (LIPITOR) 80 MG tablet     . BD INSULIN SYRINGE ULTRAFINE 31G X 15/64" 0.3 ML MISC     . cholecalciferol (VITAMIN D) 1000 UNITS tablet Take 1,000 Units by mouth daily.     . clindamycin (CLEOCIN) 300 MG capsule Take 1 capsule (300 mg total) by mouth 3 (three) times daily. 30 capsule 0  . clotrimazole-betamethasone (LOTRISONE) cream     . cyclobenzaprine (FLEXERIL) 10 MG tablet Take 10 mg by mouth at bedtime.     . diclofenac sodium (VOLTAREN) 1 % GEL 3 g to 3 larges joints up to three times daily (Patient taking differently: Apply 2 g topically 3 (three) times daily. 3 g to 3 larges joints up to three times daily) 1 Tube 3  . docusate sodium (COLACE) 100 MG capsule Take 1 capsule (100 mg total) by  mouth 2 (two) times daily. 10 capsule 0  . ferrous sulfate (FEOSOL) 325 (65 FE) MG tablet Take 325-650 mg by mouth daily with breakfast.    . folic acid (FOLVITE) 1 MG tablet Take 1 mg by mouth daily.    Marland Kitchen HYDROcodone-acetaminophen (NORCO) 5-325 MG tablet Take 1 tablet by mouth every 6 (six) hours as needed for moderate pain. 21 tablet 0  . hydroxychloroquine (PLAQUENIL) 200 MG tablet Take 1 tablet (200 mg total) by mouth daily. 30 tablet 2  . insulin NPH-regular Human (NOVOLIN 70/30) (70-30) 100 UNIT/ML injection Inject 6-8 Units  into the skin 3 (three) times daily with meals. Sliding source    . leflunomide (ARAVA) 10 MG tablet Take 1 tablet PO every other day for two weeks then increase to 1 tablet PO daily if labs are stable. 23 tablet 0  . levothyroxine (SYNTHROID, LEVOTHROID) 150 MCG tablet Take 1 tablet (150 mcg total) by mouth daily before breakfast. (Patient taking differently: Take 175 mcg by mouth daily before breakfast. )    . levothyroxine (SYNTHROID, LEVOTHROID) 175 MCG tablet     . losartan (COZAAR) 50 MG tablet Take 50 mg by mouth every evening.    . multivitamin (THERAGRAN) per tablet Take 1 tablet by mouth daily.     . predniSONE (DELTASONE) 5 MG tablet 1 tablet by mouth every morning  For 2 weeks then half tablet by mouth every morning for 2 weeks. 21 tablet 0  . pregabalin (LYRICA) 75 MG capsule 1 bid 28 capsule 0  . promethazine (PHENERGAN) 12.5 MG tablet Take 1 tablet (12.5 mg total) by mouth every 6 (six) hours as needed for nausea or vomiting. 30 tablet 0  . VITAMIN E PO Take 1 tablet by mouth daily.    Marland Kitchen allopurinol (ZYLOPRIM) 300 MG tablet Take 1 tablet (300 mg total) by mouth daily. 30 tablet 5   Current Facility-Administered Medications on File Prior to Visit  Medication Dose Route Frequency Provider Last Rate Last Dose  . triamcinolone acetonide (KENALOG) 10 MG/ML injection 10 mg  10 mg Other Once Landis Martins, DPM      . triamcinolone acetonide (KENALOG) 10 MG/ML injection 10 mg  10 mg Other Once Landis Martins, DPM       Allergies  Allergen Reactions  . Methotrexate Derivatives Other (See Comments)    Elevated creat. Levels  . Other Other (See Comments)    NO Blood products.  [name]- causes low blood sugar (pt states she was taking medication but cant remember what it was for.)  . Penicillins Rash    Has patient had a PCN reaction causing immediate rash, facial/tongue/throat swelling, SOB or lightheadedness with hypotension: Yes Has patient had a PCN reaction causing severe  rash involving mucus membranes or skin necrosis: Yes  Has patient had a PCN reaction that required hospitalization No Has patient had a PCN reaction occurring within the last 10 years: No If all of the above answers are "NO", then may proceed with Cephalosporin use.   . Remicade [Infliximab] Other (See Comments)    Inadequate response   . Zocor [Simvastatin] Other (See Comments)    Leg cramps      Objective: General: Patient is awake, alert, and oriented x 3 and in no acute distress.  Integument: Skin is warm, dry and supple bilateral. Nails are tender, long, thickened and dystrophic with subungual debris, consistent with onychomycosis, 1-5 bilateral. No open lesions. + callus/ preulcerative lesions present at right 4th toe distal tuft (  mostly involved) with dry blood, with no signs of infection. Remaining integument unremarkable.  Vasculature:  Dorsalis Pedis pulse 1/4 bilateral. Posterior Tibial pulse  1/4 bilateral. Capillary fill time <3 sec 1-5 bilateral. Scant hair growth to the level of the digits.Temperature gradient within normal limits. No varicosities present bilateral. Trace edema present bilateral.   Neurology: The patient has diminished sensation measured with a 5.07/10g Semmes Weinstein Monofilament at all pedal sites bilateral . Vibratory sensation diminished bilateral with tuning fork. No Babinski sign present bilateral.   Musculoskeletal: +Bunion and hammertoe pedal deformities noted bilateral. Pes planus foot type. Muscular strength 5/5 in all lower extremity muscular groups bilateral. No tenderness with calf compression bilateral.   Assessment and Plan: Problem List Items Addressed This Visit    None    Visit Diagnoses    Pre-ulcerative calluses    -  Primary   Pain due to onychomycosis of toenails of both feet       Type 2 diabetes mellitus with diabetic neuropathy, with long-term current use of insulin ([place])         -Examined patient. -Discussed and educated  patient on diabetic foot care, especially with  regards to the vascular, neurological and musculoskeletal systems.  -Stressed the importance of good glycemic control and the detriment of not  controlling glucose levels in relation to the foot. -Mechanically debrided all nails 1-5 bilateral using sterile nail nipper and filed with dremel without incident  -Debrided callus at right 4th toe using sterile chisel blade without incident. ADVISED PATIENT TO CONSIDER FOR [place] TO PLAN TENOTOMY OF TOE OF WHICH WE CAN DO IN OFFICE IF CONTINUES TO BE PAINFUL -Gave toe caps to use to 4th toes meanwhile -Continue with cane on right for stability in gait -Answered all patient questions -Patient to return as in 8-10 weeks for nail and callus care and to discuss in office surgery -Patient advised to call the office if any problems or questions arise in the meantime.  Landis Martins, DPM

## 2016-10-15 ENCOUNTER — Telehealth: Payer: Self-pay | Admitting: Radiology

## 2016-10-15 NOTE — Telephone Encounter (Signed)
I have called patient to advise labs are stable see PCP for Anemia

## 2016-10-15 NOTE — Telephone Encounter (Signed)
-----   Message from Bo Merino, MD sent at 10/13/2016  9:28 PM EDT ----- Labs are stable . Anemia persists- please forward labs to her PCP and notify Pt. uric acid is in desirable range

## 2016-11-03 DIAGNOSIS — Z96652 Presence of left artificial knee joint: Secondary | ICD-10-CM | POA: Diagnosis not present

## 2016-11-03 DIAGNOSIS — M1711 Unilateral primary osteoarthritis, right knee: Secondary | ICD-10-CM | POA: Diagnosis not present

## 2016-11-06 ENCOUNTER — Other Ambulatory Visit: Payer: Self-pay | Admitting: *Deleted

## 2016-11-06 DIAGNOSIS — M0579 Rheumatoid arthritis with rheumatoid factor of multiple sites without organ or systems involvement: Secondary | ICD-10-CM

## 2016-11-06 NOTE — Progress Notes (Signed)
Infusion orders are current for patient CBC CMP Tylenol Benadryl appointments are up to date and follow up appointment  is scheduled. Yearly Chest X-ray is up to date.

## 2016-11-07 DIAGNOSIS — H2513 Age-related nuclear cataract, bilateral: Secondary | ICD-10-CM | POA: Diagnosis not present

## 2016-11-07 DIAGNOSIS — H2512 Age-related nuclear cataract, left eye: Secondary | ICD-10-CM | POA: Diagnosis not present

## 2016-11-10 ENCOUNTER — Other Ambulatory Visit (HOSPITAL_COMMUNITY): Payer: Self-pay

## 2016-11-10 ENCOUNTER — Ambulatory Visit (HOSPITAL_COMMUNITY)
Admission: RE | Admit: 2016-11-10 | Discharge: 2016-11-10 | Disposition: A | Discharge status: Home / Self Care | Payer: Medicare Other | Source: Ambulatory Visit | Attending: Rheumatology | Admitting: Rheumatology

## 2016-11-10 DIAGNOSIS — M0579 Rheumatoid arthritis with rheumatoid factor of multiple sites without organ or systems involvement: Secondary | ICD-10-CM | POA: Insufficient documentation

## 2016-11-10 MED ORDER — DIPHENHYDRAMINE HCL 25 MG PO CAPS
25.0000 mg | ORAL_CAPSULE | ORAL | Status: DC
  Administered 2016-11-10: 25 mg via ORAL

## 2016-11-10 MED ORDER — ACETAMINOPHEN 325 MG PO TABS: 650.0000 mg | ORAL_TABLET | ORAL | Status: DC

## 2016-11-10 MED ORDER — DIPHENHYDRAMINE HCL 25 MG PO CAPS
ORAL_CAPSULE | ORAL | Status: AC
  Administered 2016-11-10: 12:00:00 25 mg via ORAL
  Filled 2016-11-10: qty 1

## 2016-11-10 MED ORDER — SODIUM CHLORIDE 0.9 % IV SOLN
750.0000 mg | INTRAVENOUS | Status: DC
Start: 2016-11-10 — End: 2016-11-11
  Administered 2016-11-10: 750 mg via INTRAVENOUS
  Filled 2016-11-10: qty 30

## 2016-11-13 DIAGNOSIS — E11649 Type 2 diabetes mellitus with hypoglycemia without coma: Secondary | ICD-10-CM | POA: Diagnosis not present

## 2016-11-13 DIAGNOSIS — H9190 Unspecified hearing loss, unspecified ear: Secondary | ICD-10-CM | POA: Diagnosis not present

## 2016-11-13 DIAGNOSIS — E1136 Type 2 diabetes mellitus with diabetic cataract: Secondary | ICD-10-CM | POA: Diagnosis not present

## 2016-11-13 DIAGNOSIS — Z79899 Other long term (current) drug therapy: Secondary | ICD-10-CM | POA: Diagnosis not present

## 2016-11-13 DIAGNOSIS — Z79891 Long term (current) use of opiate analgesic: Secondary | ICD-10-CM | POA: Diagnosis not present

## 2016-11-13 DIAGNOSIS — H547 Unspecified visual loss: Secondary | ICD-10-CM | POA: Diagnosis not present

## 2016-11-13 DIAGNOSIS — E1142 Type 2 diabetes mellitus with diabetic polyneuropathy: Secondary | ICD-10-CM | POA: Diagnosis not present

## 2016-11-13 DIAGNOSIS — Z87891 Personal history of nicotine dependence: Secondary | ICD-10-CM | POA: Diagnosis not present

## 2016-11-13 DIAGNOSIS — I350 Nonrheumatic aortic (valve) stenosis: Secondary | ICD-10-CM | POA: Diagnosis not present

## 2016-11-13 DIAGNOSIS — J45909 Unspecified asthma, uncomplicated: Secondary | ICD-10-CM | POA: Diagnosis not present

## 2016-11-13 DIAGNOSIS — M109 Gout, unspecified: Secondary | ICD-10-CM | POA: Diagnosis not present

## 2016-11-13 DIAGNOSIS — Z8673 Personal history of transient ischemic attack (TIA), and cerebral infarction without residual deficits: Secondary | ICD-10-CM | POA: Diagnosis not present

## 2016-11-13 DIAGNOSIS — Z79890 Hormone replacement therapy (postmenopausal): Secondary | ICD-10-CM | POA: Diagnosis not present

## 2016-11-13 DIAGNOSIS — I110 Hypertensive heart disease with heart failure: Secondary | ICD-10-CM | POA: Diagnosis not present

## 2016-11-13 DIAGNOSIS — J45998 Other asthma: Secondary | ICD-10-CM | POA: Diagnosis not present

## 2016-11-13 DIAGNOSIS — I501 Left ventricular failure: Secondary | ICD-10-CM | POA: Diagnosis not present

## 2016-11-13 DIAGNOSIS — Z888 Allergy status to other drugs, medicaments and biological substances status: Secondary | ICD-10-CM | POA: Diagnosis not present

## 2016-11-13 DIAGNOSIS — Z794 Long term (current) use of insulin: Secondary | ICD-10-CM | POA: Diagnosis not present

## 2016-11-13 DIAGNOSIS — Z880 Allergy status to penicillin: Secondary | ICD-10-CM | POA: Diagnosis not present

## 2016-11-13 DIAGNOSIS — Z86011 Personal history of benign neoplasm of the brain: Secondary | ICD-10-CM | POA: Diagnosis not present

## 2016-11-13 DIAGNOSIS — H25812 Combined forms of age-related cataract, left eye: Secondary | ICD-10-CM | POA: Diagnosis not present

## 2016-11-13 DIAGNOSIS — Z7982 Long term (current) use of aspirin: Secondary | ICD-10-CM | POA: Diagnosis not present

## 2016-11-21 DIAGNOSIS — M549 Dorsalgia, unspecified: Secondary | ICD-10-CM | POA: Diagnosis not present

## 2016-11-21 DIAGNOSIS — L219 Seborrheic dermatitis, unspecified: Secondary | ICD-10-CM | POA: Diagnosis not present

## 2016-11-21 DIAGNOSIS — W19XXXA Unspecified fall, initial encounter: Secondary | ICD-10-CM | POA: Diagnosis not present

## 2016-11-21 DIAGNOSIS — M069 Rheumatoid arthritis, unspecified: Secondary | ICD-10-CM | POA: Diagnosis not present

## 2016-12-04 DIAGNOSIS — J45998 Other asthma: Secondary | ICD-10-CM | POA: Diagnosis not present

## 2016-12-04 DIAGNOSIS — H25811 Combined forms of age-related cataract, right eye: Secondary | ICD-10-CM | POA: Diagnosis not present

## 2016-12-08 ENCOUNTER — Ambulatory Visit (HOSPITAL_COMMUNITY)
Admission: RE | Admit: 2016-12-08 | Discharge: 2016-12-08 | Disposition: A | Discharge status: Home / Self Care | Payer: Medicare Other | Source: Ambulatory Visit | Attending: Rheumatology | Admitting: Rheumatology

## 2016-12-08 DIAGNOSIS — M0579 Rheumatoid arthritis with rheumatoid factor of multiple sites without organ or systems involvement: Secondary | ICD-10-CM | POA: Diagnosis not present

## 2016-12-08 MED ORDER — DIPHENHYDRAMINE HCL 25 MG PO CAPS
ORAL_CAPSULE | ORAL | Status: AC
  Administered 2016-12-08: 12:00:00 25 mg via ORAL
  Filled 2016-12-08: qty 2

## 2016-12-08 MED ORDER — DIPHENHYDRAMINE HCL 25 MG PO CAPS
25.0000 mg | ORAL_CAPSULE | ORAL | Status: DC
  Administered 2016-12-08: 25 mg via ORAL

## 2016-12-08 MED ORDER — SODIUM CHLORIDE 0.9 % IV SOLN
750.0000 mg | INTRAVENOUS | Status: AC
  Administered 2016-12-08: 750 mg via INTRAVENOUS
  Filled 2016-12-08: qty 30

## 2016-12-08 MED ORDER — ACETAMINOPHEN 325 MG PO TABS: 650.0000 mg | ORAL_TABLET | ORAL | Status: DC

## 2016-12-09 DIAGNOSIS — E1142 Type 2 diabetes mellitus with diabetic polyneuropathy: Secondary | ICD-10-CM | POA: Diagnosis not present

## 2016-12-09 DIAGNOSIS — E039 Hypothyroidism, unspecified: Secondary | ICD-10-CM | POA: Diagnosis not present

## 2016-12-09 DIAGNOSIS — Z794 Long term (current) use of insulin: Secondary | ICD-10-CM | POA: Diagnosis not present

## 2016-12-12 ENCOUNTER — Telehealth: Payer: Self-pay

## 2016-12-12 NOTE — Telephone Encounter (Signed)
Called the patient to verify benefits for 2019. Pt currently receives infusions that may require a pre-certification. Patient states that her insurance will remain the same for 2019 (Medicare, [employer]).  [name], [employer], CPhT 9:40 AM

## 2016-12-23 ENCOUNTER — Encounter: Payer: Self-pay | Admitting: Sports Medicine

## 2016-12-23 ENCOUNTER — Ambulatory Visit (INDEPENDENT_AMBULATORY_CARE_PROVIDER_SITE_OTHER): Payer: Medicare Other | Admitting: Sports Medicine

## 2016-12-23 DIAGNOSIS — M79674 Pain in right toe(s): Secondary | ICD-10-CM | POA: Diagnosis not present

## 2016-12-23 DIAGNOSIS — Z794 Long term (current) use of insulin: Secondary | ICD-10-CM | POA: Diagnosis not present

## 2016-12-23 DIAGNOSIS — E1140 Type 2 diabetes mellitus with diabetic neuropathy, unspecified: Secondary | ICD-10-CM

## 2016-12-23 DIAGNOSIS — L84 Corns and callosities: Secondary | ICD-10-CM | POA: Diagnosis not present

## 2016-12-23 DIAGNOSIS — B351 Tinea unguium: Secondary | ICD-10-CM

## 2016-12-23 DIAGNOSIS — M79675 Pain in left toe(s): Secondary | ICD-10-CM | POA: Diagnosis not present

## 2016-12-23 NOTE — Patient Instructions (Signed)
Jamician Black Castor Oil for hail Tea tree oil for nails

## 2016-12-23 NOTE — Progress Notes (Deleted)
Office Visit Note  Patient: [name]             Date of Birth: June 25, 1940           MRN: [id number]             PCP: Jonathon Jordan, MD Referring: Jonathon Jordan, MD Visit Date: 01/02/2017 Occupation: @GUAROCC @    Subjective:  No chief complaint on file.   History of Present Illness: [name] is a 76 y.o. female ***   Activities of Daily Living:  Patient reports morning stiffness for *** {minute/hour:19697}.   Patient {ACTIONS;DENIES/REPORTS:21021675::"Denies"} nocturnal pain.  Difficulty dressing/grooming: {ACTIONS;DENIES/REPORTS:21021675::"Denies"} Difficulty climbing stairs: {ACTIONS;DENIES/REPORTS:21021675::"Denies"} Difficulty getting out of chair: {ACTIONS;DENIES/REPORTS:21021675::"Denies"} Difficulty using hands for taps, buttons, cutlery, and/or writing: {ACTIONS;DENIES/REPORTS:21021675::"Denies"}   No Rheumatology ROS completed.   PMFS History:  Patient Active Problem List   Diagnosis Date Noted  . Primary osteoarthritis of right knee 09/25/2016  . 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 Elevated d-dimer   . Encephalopathy   . Sinus bradycardia   . Acute encephalopathy 02/27/2016  . Seropositive rheumatoid arthritis of multiple sites ([place]) 01/17/2016  . Bilateral hand pain 01/17/2016  . Bilateral wrist pain 01/17/2016  . Pain in right elbow 01/17/2016  . Left shoulder pain 01/17/2016  . Encounter for therapeutic drug monitoring 11/02/2015  . Bronchiectasis without complication ([place]) 01/60/1093  . Chronic gout involving toe without tophus 10/30/2015  . High risk medication use 10/29/2015  . Gout 10/29/2015  . H/O total knee replacement, left 10/29/2015  . 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 Diabetes mellitus, type II, insulin dependent ([place]) 10/18/2015  . Rheumatoid arthritis flare ([place]) 10/18/2015  . AKI (acute kidney injury) ([place]) 10/18/2015  . CKD (chronic kidney  disease) stage 3, GFR 30-59 ml/min ([facility]) 10/18/2015  . Nodule of left lung 10/18/2015  . Neuropathy 08/22/2014  . Hypothyroidism 08/22/2014  . Chronic pain 08/22/2014  . Chronic congestive heart failure with left ventricular diastolic dysfunction ([place]) 08/08/2013  . Chest pain 08/05/2013  . Aortic stenosis 12/22/2012  . Hyperlipidemia 12/22/2012  . PVC (premature ventricular contraction) 12/22/2012  . HTN (hypertension) 12/22/2012  . 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 Open angle with borderline findings, high risk 12/15/2011  . Cataract, nuclear 05/25/2011    Past Medical History:  Diagnosis Date  . Allergic rhinitis   . Anemia   . Aortic stenosis 12/22/2012   Mild 3/14-2.35 m/s peak velocity  . Cataract 2018  . CHF (congestive heart failure) ([place])   . Chronic pain   . CKD (chronic kidney disease), stage III ([place])   . Diabetes mellitus   . Diverticulosis   . DVT (deep venous thrombosis) ([place])   . Glaucoma   . Hemorrhoids    internal  . HTN (hypertension)   . Hypercholesteremia   . Hyperlipidemia 12/22/2012  . Hypothyroidism   . Meningioma ([place]) 2008  . Osteoarthritis   . Peripheral neuropathy   . PPD positive    6 months ago  . PVC (premature ventricular contraction) 12/22/2012  . Rheumatoid arthritis(714.0)   . Sickle cell trait ([place])   . 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 Hypotension Mother   . CVA Mother   . Diabetes Mother   . Hypertension Father   . Kidney failure Father    Past  Surgical History:  Procedure Laterality Date  . ABDOMINAL HYSTERECTOMY  1978   TAH,& BSO 1  . fistula repair  03/2010   partial colectomy  . intracranial surgery  11/2007   removal of meningioma  . JOINT REPLACEMENT  2001   left TKR  . LEFT HEART CATHETERIZATION WITH CORONARY ANGIOGRAM N/A 08/08/2013   Procedure: LEFT HEART CATHETERIZATION WITH CORONARY ANGIOGRAM;   Surgeon: Peter M Martinique, MD;  Location: [facility] CATH LAB;  Service: Cardiovascular;  Laterality: N/A;  . ROTATOR CUFF REPAIR  1999   BIlateral  . SIGMOIDECTOMY  2012  . TOE SURGERY Left 2018  . TONSILLECTOMY AND ADENOIDECTOMY    . TOTAL THYROIDECTOMY  1992  . 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    Investigation: No additional findings. CBC Latest Ref Rng & Units 10/13/2016 08/18/2016 06/09/2016  WBC 4.0 - 10.5 K/uL 8.0 7.6 9.2  Hemoglobin 12.0 - 15.0 g/dL 9.7(L) 10.5(L) 10.0(L)  Hematocrit 36.0 - 46.0 % 30.7(L) 31.7(L) 30.4(L)  Platelets 150 - 400 K/uL 309 330 289   CMP Latest Ref Rng & Units 10/13/2016 08/18/2016 06/09/2016  Glucose 65 - 99 mg/dL 68 133(H) 97  BUN 6 - 20 mg/dL 14 14 14   Creatinine 0.44 - 1.00 mg/dL 1.28(H) 1.42(H) 1.64(H)  Sodium 135 - 145 mmol/L 143 139 140  Potassium 3.5 - 5.1 mmol/L 3.5 4.2 4.6  Chloride 101 - 111 mmol/L 110 106 108  CO2 22 - 32 mmol/L 23 22 23   Calcium 8.9 - 10.3 mg/dL 9.3 9.8 9.2  Total Protein 6.5 - 8.1 g/dL 7.0 7.7 7.0  Total Bilirubin 0.3 - 1.2 mg/dL 0.8 0.9 0.7  Alkaline Phos 38 - 126 U/L 85 80 91  AST 15 - 41 U/L 18 26 25   ALT 14 - 54 U/L 12(L) 13(L) 13(L)    Imaging: No results found.  Speciality Comments: Orencia 750mg  IV  every 4 weeks TB gold has been positive in the past, patient has yearly chest xrays    Procedures:  No procedures performed Allergies: Methotrexate derivatives; Other; Penicillins; Remicade [infliximab]; and Zocor [simvastatin]   Assessment / Plan:     Visit Diagnoses: No diagnosis found.    Orders: No orders of the defined types were placed in this encounter.  No orders of the defined types were placed in this encounter.   Face-to-face time spent with patient was *** minutes. 50%  of time was spent in counseling and coordination of care.  Follow-Up Instructions: No Follow-up on file.   Earnestine Mealing, CMA  Note - This record has been created using Editor, commissioning.  Chart creation errors have been sought, but may not always  have been located. Such creation errors do not reflect on  the standard of medical care.

## 2016-12-23 NOTE — Progress Notes (Addendum)
Patient ID: [name], female   DOB: Jul 04, 1940, 76 y.o.   MRN: [id number]  Subjective: [name] is a 76 y.o. female patient with history of type 2 diabetes who returns to office today complaining of long, painful nails and callus while ambulating in shoes; unable to trim. Patient states that the glucose reading this morning was not recorded. Patient admits increasing pain to right 4th toe and recurrence of some callus at tip and top of left 4th toe that isn't as painful as the right. Patient denies any other symptoms.   Patient Active Problem List   Diagnosis Date Noted  . Primary osteoarthritis of right knee 09/25/2016  . 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 Elevated d-dimer   . Encephalopathy   . Sinus bradycardia   . Acute encephalopathy 02/27/2016  . Seropositive rheumatoid arthritis of multiple sites ([place]) 01/17/2016  . Bilateral hand pain 01/17/2016  . Bilateral wrist pain 01/17/2016  . Pain in right elbow 01/17/2016  . Left shoulder pain 01/17/2016  . Encounter for therapeutic drug monitoring 11/02/2015  . Bronchiectasis without complication ([place]) 78/46/9629  . Chronic gout involving toe without tophus 10/30/2015  . High risk medication use 10/29/2015  . Gout 10/29/2015  . H/O total knee replacement, left 10/29/2015  . 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 Diabetes mellitus, type II, insulin dependent ([place]) 10/18/2015  . Rheumatoid arthritis flare ([place]) 10/18/2015  . AKI (acute kidney injury) ([place]) 10/18/2015  . CKD (chronic kidney disease) stage 3, GFR 30-59 ml/min ([facility]) 10/18/2015  . Nodule of left lung 10/18/2015  . Neuropathy 08/22/2014  . Hypothyroidism 08/22/2014  . Chronic pain 08/22/2014  . Chronic congestive heart failure with left ventricular diastolic dysfunction ([place]) 08/08/2013  . Chest pain 08/05/2013  . Aortic stenosis 12/22/2012  . Hyperlipidemia 12/22/2012  . PVC  (premature ventricular contraction) 12/22/2012  . HTN (hypertension) 12/22/2012  . 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 Open angle with borderline findings, high risk 12/15/2011  . Cataract, nuclear 05/25/2011   Current Outpatient Medications on File Prior to Visit  Medication Sig Dispense Refill  . Abatacept (ORENCIA IV) Inject into the vein. Daughters are not sure of doses. Received in the short stay area once a month    . acetaminophen (TYLENOL) 500 MG tablet Take 1,000 mg by mouth every 6 (six) hours as needed for mild pain.    Marland Kitchen allopurinol (ZYLOPRIM) 300 MG tablet Take 1 tablet (300 mg total) by mouth daily. 30 tablet 5  . aspirin EC 81 MG EC tablet Take 1 tablet (81 mg total) by mouth daily. 30 tablet 0  . atorvastatin (LIPITOR) 40 MG tablet Take 40 mg by mouth daily at 6 PM.     . atorvastatin (LIPITOR) 80 MG tablet     . BD INSULIN SYRINGE ULTRAFINE 31G X 15/64" 0.3 ML MISC     . cholecalciferol (VITAMIN D) 1000 UNITS tablet Take 1,000 Units by mouth daily.     . clindamycin (CLEOCIN) 300 MG capsule Take 1 capsule (300 mg total) by mouth 3 (three) times daily. 30 capsule 0  . clotrimazole-betamethasone (LOTRISONE) cream     . cyclobenzaprine (FLEXERIL) 10 MG tablet Take 10 mg by mouth at bedtime.     . diclofenac sodium (VOLTAREN) 1 % GEL 3 g to 3 larges joints up to three times daily (Patient taking differently: Apply 2 g topically 3 (three) times daily. 3 g to 3 larges joints up to  three times daily) 1 Tube 3  . docusate sodium (COLACE) 100 MG capsule Take 1 capsule (100 mg total) by mouth 2 (two) times daily. 10 capsule 0  . ferrous sulfate (FEOSOL) 325 (65 FE) MG tablet Take 325-650 mg by mouth daily with breakfast.    . folic acid (FOLVITE) 1 MG tablet Take 1 mg by mouth daily.    Marland Kitchen HYDROcodone-acetaminophen (NORCO) 5-325 MG tablet Take 1 tablet by mouth every 6 (six) hours as needed for moderate pain. 21 tablet 0  . hydroxychloroquine (PLAQUENIL) 200 MG tablet Take 1 tablet (200  mg total) by mouth daily. 30 tablet 2  . insulin NPH-regular Human (NOVOLIN 70/30) (70-30) 100 UNIT/ML injection Inject 6-8 Units into the skin 3 (three) times daily with meals. Sliding source    . leflunomide (ARAVA) 10 MG tablet Take 1 tablet PO every other day for two weeks then increase to 1 tablet PO daily if labs are stable. 23 tablet 0  . levothyroxine (SYNTHROID, LEVOTHROID) 150 MCG tablet Take 1 tablet (150 mcg total) by mouth daily before breakfast. (Patient taking differently: Take 175 mcg by mouth daily before breakfast. )    . levothyroxine (SYNTHROID, LEVOTHROID) 175 MCG tablet     . losartan (COZAAR) 50 MG tablet Take 50 mg by mouth every evening.    . multivitamin (THERAGRAN) per tablet Take 1 tablet by mouth daily.     . predniSONE (DELTASONE) 5 MG tablet 1 tablet by mouth every morning  For 2 weeks then half tablet by mouth every morning for 2 weeks. 21 tablet 0  . pregabalin (LYRICA) 75 MG capsule 1 bid 28 capsule 0  . promethazine (PHENERGAN) 12.5 MG tablet Take 1 tablet (12.5 mg total) by mouth every 6 (six) hours as needed for nausea or vomiting. 30 tablet 0  . VITAMIN E PO Take 1 tablet by mouth daily.     Current Facility-Administered Medications on File Prior to Visit  Medication Dose Route Frequency Provider Last Rate Last Dose  . triamcinolone acetonide (KENALOG) 10 MG/ML injection 10 mg  10 mg Other Once Landis Martins, DPM      . triamcinolone acetonide (KENALOG) 10 MG/ML injection 10 mg  10 mg Other Once Landis Martins, DPM       Allergies  Allergen Reactions  . Methotrexate Derivatives Other (See Comments)    Elevated creat. Levels  . Other Other (See Comments)    NO Blood products.  [name]- causes low blood sugar (pt states she was taking medication but cant remember what it was for.)  . Penicillins Rash    Has patient had a PCN reaction causing immediate rash, facial/tongue/throat swelling, SOB or lightheadedness with hypotension: Yes Has patient had  a PCN reaction causing severe rash involving mucus membranes or skin necrosis: Yes  Has patient had a PCN reaction that required hospitalization No Has patient had a PCN reaction occurring within the last 10 years: No If all of the above answers are "NO", then may proceed with Cephalosporin use.   . Remicade [Infliximab] Other (See Comments)    Inadequate response   . Zocor [Simvastatin] Other (See Comments)    Leg cramps      Objective: General: Patient is awake, alert, and oriented x 3 and in no acute distress.  Integument: Skin is warm, dry and supple bilateral. Nails are tender, long, thickened and dystrophic with subungual debris, consistent with onychomycosis, 1-5 bilateral. No open lesions. + callus/ preulcerative lesions present at left 4th and  right 4th toe distal tuft (mostly involved) with dry blood, with no signs of infection. Remaining integument unremarkable.  Vasculature:  Dorsalis Pedis pulse 1/4 bilateral. Posterior Tibial pulse  1/4 bilateral. Capillary fill time <3 sec 1-5 bilateral. Scant hair growth to the level of the digits.Temperature gradient within normal limits. No varicosities present bilateral. Trace edema present bilateral.   Neurology: The patient has diminished sensation measured with a 5.07/10g Semmes Weinstein Monofilament at all pedal sites bilateral . Vibratory sensation diminished bilateral with tuning fork. No Babinski sign present bilateral.   Musculoskeletal: +Bunion and hammertoe pedal deformities noted bilateral. Pes planus foot type. Muscular strength 5/5 in all lower extremity muscular groups bilateral. No tenderness with calf compression bilateral.   Assessment and Plan: Problem List Items Addressed This Visit    None    Visit Diagnoses    Pain due to onychomycosis of toenails of both feet    -  Primary   Pre-ulcerative calluses       Type 2 diabetes mellitus with diabetic neuropathy, with long-term current use of insulin ([place])          -Examined patient. -Discussed and educated patient on diabetic foot care, especially with  regards to the vascular, neurological and musculoskeletal systems.  -Stressed the importance of good glycemic control and the detriment of not controlling glucose levels in relation to the foot. -Mechanically debrided all nails 1-5 bilateral using sterile nail nipper and filed with dremel without incident  -Debrided callus at right and left 4th toes using sterile chisel blade without incident. -ABN Signed -Continue with good supportive shoes  -Continue with cane on right for stability in gait -Answered all patient questions -Patient to return as in 10-12 weeks for nail and callus care  -Patient advised to call the office if any problems or questions arise in the meantime.  Landis Martins, DPM

## 2017-01-02 ENCOUNTER — Ambulatory Visit: Payer: Medicare Other | Admitting: Rheumatology

## 2017-01-05 ENCOUNTER — Encounter (HOSPITAL_COMMUNITY): Payer: Medicare Other

## 2017-01-09 DIAGNOSIS — M5137 Other intervertebral disc degeneration, lumbosacral region: Secondary | ICD-10-CM | POA: Diagnosis not present

## 2017-01-09 DIAGNOSIS — M5416 Radiculopathy, lumbar region: Secondary | ICD-10-CM | POA: Diagnosis not present

## 2017-01-09 DIAGNOSIS — M5126 Other intervertebral disc displacement, lumbar region: Secondary | ICD-10-CM | POA: Diagnosis not present

## 2017-01-12 ENCOUNTER — Ambulatory Visit (HOSPITAL_COMMUNITY)
Admission: RE | Admit: 2017-01-12 | Discharge: 2017-01-12 | Disposition: A | Discharge status: Home / Self Care | Payer: Medicare Other | Source: Ambulatory Visit | Attending: Rheumatology | Admitting: Rheumatology

## 2017-01-12 ENCOUNTER — Telehealth: Payer: Self-pay | Admitting: Rheumatology

## 2017-01-12 ENCOUNTER — Other Ambulatory Visit: Payer: Self-pay

## 2017-01-12 DIAGNOSIS — M0609 Rheumatoid arthritis without rheumatoid factor, multiple sites: Secondary | ICD-10-CM | POA: Insufficient documentation

## 2017-01-12 LAB — CBC
HCT: 34.2 % — ABNORMAL LOW (ref 36.0–46.0)
Hemoglobin: 10.9 g/dL — ABNORMAL LOW (ref 12.0–15.0)
MCH: 27.8 pg (ref 26.0–34.0)
MCHC: 31.9 g/dL (ref 30.0–36.0)
MCV: 87.2 fL (ref 78.0–100.0)
PLATELETS: 340 10*3/uL (ref 150–400)
RBC: 3.92 MIL/uL (ref 3.87–5.11)
RDW: 15.8 % — AB (ref 11.5–15.5)
WBC: 10.2 10*3/uL (ref 4.0–10.5)

## 2017-01-12 LAB — COMPREHENSIVE METABOLIC PANEL
ALT: 12 U/L — ABNORMAL LOW (ref 14–54)
ANION GAP: 7 (ref 5–15)
AST: 20 U/L (ref 15–41)
Albumin: 4 g/dL (ref 3.5–5.0)
Alkaline Phosphatase: 93 U/L (ref 38–126)
BUN: 25 mg/dL — ABNORMAL HIGH (ref 6–20)
CHLORIDE: 107 mmol/L (ref 101–111)
CO2: 23 mmol/L (ref 22–32)
Calcium: 9.6 mg/dL (ref 8.9–10.3)
Creatinine, Ser: 1.64 mg/dL — ABNORMAL HIGH (ref 0.44–1.00)
GFR, EST AFRICAN AMERICAN: 34 mL/min — AB (ref >60)
GFR, EST NON AFRICAN AMERICAN: 29 mL/min — AB (ref >60)
Glucose, Bld: 109 mg/dL — ABNORMAL HIGH (ref 65–99)
Potassium: 4.7 mmol/L (ref 3.5–5.1)
Sodium: 137 mmol/L (ref 135–145)
TOTAL PROTEIN: 7.6 g/dL (ref 6.5–8.1)
Total Bilirubin: 0.9 mg/dL (ref 0.3–1.2)

## 2017-01-12 MED ORDER — ACETAMINOPHEN 325 MG PO TABS: 650.0000 mg | ORAL_TABLET | Freq: Once | ORAL | Status: DC

## 2017-01-12 MED ORDER — DIPHENHYDRAMINE HCL 25 MG PO CAPS
25.0000 mg | ORAL_CAPSULE | Freq: Once | ORAL | Status: DC
  Administered 2017-01-12: 25 mg via ORAL

## 2017-01-12 MED ORDER — DIPHENHYDRAMINE HCL 25 MG PO CAPS
ORAL_CAPSULE | ORAL | Status: AC
  Administered 2017-01-12: 14:00:00 25 mg via ORAL
  Filled 2017-01-12: qty 1

## 2017-01-12 MED ORDER — SODIUM CHLORIDE 0.9 % IV SOLN
750.0000 mg | Freq: Once | INTRAVENOUS | Status: DC
  Administered 2017-01-12: 14:00:00 750 mg via INTRAVENOUS
  Filled 2017-01-12: qty 30

## 2017-01-12 NOTE — Telephone Encounter (Signed)
[facility] at the hospital called and is with the patient waiting for verbal orders for her  IV infusion of Orencia.  She is waiting for a call back at [phone]

## 2017-01-12 NOTE — Telephone Encounter (Signed)
Orders placed.

## 2017-01-13 NOTE — Progress Notes (Signed)
Elevated creatinine. Please notify patient and fax results to her PCP.

## 2017-01-20 NOTE — Progress Notes (Deleted)
Office Visit Note  Patient: [name]             Date of Birth: 11-Jul-1940           MRN: [id number]             PCP: Jonathon Jordan, MD Referring: Jonathon Jordan, MD Visit Date: 01/23/2017 Occupation: @GUAROCC @    Subjective:  No chief complaint on file.   History of Present Illness: [name] is a 77 y.o. female ***   Activities of Daily Living:  Patient reports morning stiffness for *** {minute/hour:19697}.   Patient {ACTIONS;DENIES/REPORTS:21021675::"Denies"} nocturnal pain.  Difficulty dressing/grooming: {ACTIONS;DENIES/REPORTS:21021675::"Denies"} Difficulty climbing stairs: {ACTIONS;DENIES/REPORTS:21021675::"Denies"} Difficulty getting out of chair: {ACTIONS;DENIES/REPORTS:21021675::"Denies"} Difficulty using hands for taps, buttons, cutlery, and/or writing: {ACTIONS;DENIES/REPORTS:21021675::"Denies"}   No Rheumatology ROS completed.   PMFS History:  Patient Active Problem List   Diagnosis Date Noted  . Primary osteoarthritis of right knee 09/25/2016  . 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 Elevated d-dimer   . Encephalopathy   . Sinus bradycardia   . Acute encephalopathy 02/27/2016  . Seropositive rheumatoid arthritis of multiple sites ([place]) 01/17/2016  . Bilateral hand pain 01/17/2016  . Bilateral wrist pain 01/17/2016  . Pain in right elbow 01/17/2016  . Left shoulder pain 01/17/2016  . Encounter for therapeutic drug monitoring 11/02/2015  . Bronchiectasis without complication ([place]) 09/98/3382  . Chronic gout involving toe without tophus 10/30/2015  . High risk medication use 10/29/2015  . Gout 10/29/2015  . H/O total knee replacement, left 10/29/2015  . 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 Diabetes mellitus, type II, insulin dependent ([place]) 10/18/2015  . Rheumatoid arthritis flare ([place]) 10/18/2015  . AKI (acute kidney injury) ([place]) 10/18/2015  . CKD (chronic kidney  disease) stage 3, GFR 30-59 ml/min ([facility]) 10/18/2015  . Nodule of left lung 10/18/2015  . Neuropathy 08/22/2014  . Hypothyroidism 08/22/2014  . Chronic pain 08/22/2014  . Chronic congestive heart failure with left ventricular diastolic dysfunction ([place]) 08/08/2013  . Chest pain 08/05/2013  . Aortic stenosis 12/22/2012  . Hyperlipidemia 12/22/2012  . PVC (premature ventricular contraction) 12/22/2012  . HTN (hypertension) 12/22/2012  . 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 Open angle with borderline findings, high risk 12/15/2011  . Cataract, nuclear 05/25/2011    Past Medical History:  Diagnosis Date  . Allergic rhinitis   . Anemia   . Aortic stenosis 12/22/2012   Mild 3/14-2.35 m/s peak velocity  . Cataract 2018  . CHF (congestive heart failure) ([place])   . Chronic pain   . CKD (chronic kidney disease), stage III ([place])   . Diabetes mellitus   . Diverticulosis   . DVT (deep venous thrombosis) ([place])   . Glaucoma   . Hemorrhoids    internal  . HTN (hypertension)   . Hypercholesteremia   . Hyperlipidemia 12/22/2012  . Hypothyroidism   . Meningioma ([place]) 2008  . Osteoarthritis   . Peripheral neuropathy   . PPD positive    6 months ago  . PVC (premature ventricular contraction) 12/22/2012  . Rheumatoid arthritis(714.0)   . Sickle cell trait ([place])   . 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 Hypotension Mother   . CVA Mother   . Diabetes Mother   . Hypertension Father   . Kidney failure Father    Past  Surgical History:  Procedure Laterality Date  . ABDOMINAL HYSTERECTOMY  1978   TAH,& BSO 1  . fistula repair  03/2010   partial colectomy  . intracranial surgery  11/2007   removal of meningioma  . JOINT REPLACEMENT  2001   left TKR  . LEFT HEART CATHETERIZATION WITH CORONARY ANGIOGRAM N/A 08/08/2013   Procedure: LEFT HEART CATHETERIZATION WITH CORONARY ANGIOGRAM;   Surgeon: Peter M Martinique, MD;  Location: [facility] CATH LAB;  Service: Cardiovascular;  Laterality: N/A;  . ROTATOR CUFF REPAIR  1999   BIlateral  . SIGMOIDECTOMY  2012  . TOE SURGERY Left 2018  . TONSILLECTOMY AND ADENOIDECTOMY    . TOTAL THYROIDECTOMY  1992  . 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    Investigation: No additional findings.Chest xray: 08/05/2016 Uric acid: 10/13/2016 5.6 CBC Latest Ref Rng & Units 01/12/2017 10/13/2016 08/18/2016  WBC 4.0 - 10.5 K/uL 10.2 8.0 7.6  Hemoglobin 12.0 - 15.0 g/dL 10.9(L) 9.7(L) 10.5(L)  Hematocrit 36.0 - 46.0 % 34.2(L) 30.7(L) 31.7(L)  Platelets 150 - 400 K/uL 340 309 330   CMP Latest Ref Rng & Units 01/12/2017 10/13/2016 08/18/2016  Glucose 65 - 99 mg/dL 109(H) 68 133(H)  BUN 6 - 20 mg/dL 25(H) 14 14  Creatinine 0.44 - 1.00 mg/dL 1.64(H) 1.28(H) 1.42(H)  Sodium 135 - 145 mmol/L 137 143 139  Potassium 3.5 - 5.1 mmol/L 4.7 3.5 4.2  Chloride 101 - 111 mmol/L 107 110 106  CO2 22 - 32 mmol/L 23 23 22   Calcium 8.9 - 10.3 mg/dL 9.6 9.3 9.8  Total Protein 6.5 - 8.1 g/dL 7.6 7.0 7.7  Total Bilirubin 0.3 - 1.2 mg/dL 0.9 0.8 0.9  Alkaline Phos 38 - 126 U/L 93 85 80  AST 15 - 41 U/L 20 18 26   ALT 14 - 54 U/L 12(L) 12(L) 13(L)    Imaging: No results found.  Speciality Comments: Orencia 750mg  IV  every 4 weeks TB gold has been positive in the past, patient has yearly chest xrays    Procedures:  No procedures performed Allergies: Methotrexate derivatives; Other; Penicillins; Remicade [infliximab]; and Zocor [simvastatin]   Assessment / Plan:     Visit Diagnoses: No diagnosis found.    Orders: No orders of the defined types were placed in this encounter.  No orders of the defined types were placed in this  encounter.   Face-to-face time spent with patient was *** minutes. 50% of time was spent in counseling and coordination of care.  Follow-Up Instructions: No Follow-up on file.   Earnestine Mealing, CMA  Note - This record has been created using Editor, commissioning.  Chart creation errors have been sought, but may not always  have been located. Such creation errors do not reflect on  the standard of medical care.

## 2017-01-21 DIAGNOSIS — M545 Low back pain: Secondary | ICD-10-CM | POA: Diagnosis not present

## 2017-01-21 DIAGNOSIS — D329 Benign neoplasm of meninges, unspecified: Secondary | ICD-10-CM | POA: Diagnosis not present

## 2017-01-21 DIAGNOSIS — M5126 Other intervertebral disc displacement, lumbar region: Secondary | ICD-10-CM | POA: Diagnosis not present

## 2017-01-21 DIAGNOSIS — M5417 Radiculopathy, lumbosacral region: Secondary | ICD-10-CM | POA: Diagnosis not present

## 2017-01-23 ENCOUNTER — Ambulatory Visit: Payer: Medicare Other | Admitting: Rheumatology

## 2017-01-30 NOTE — Progress Notes (Signed)
Office Visit Note  Patient: [name]             Date of Birth: Dec 05, 1940           MRN: [id number]             PCP: Jonathon Jordan, MD Referring: Jonathon Jordan, MD Visit Date: 02/03/2017 Occupation: @GUAROCC @    Subjective:  Pain in joints  History of Present Illness: [name] is a 77 y.o. female  With history of seronegative rheumatoid arthritis gout and osteoarthritis. She states she notices some improvement after adding Plaquenil. Within the last 1 month she's been having increased pain and discomfort.She describes pain and discomfort in her bilateral hands and wrists joints. She continues to have discomfort around her neck and lower back. She has pain in her bilateral knee joints. Her left knee joint has been replaced.  Activities of Daily Living:  Patient reports morning stiffness for 30 minutes.   Patient Reports nocturnal pain.  Difficulty dressing/grooming: Denies Difficulty climbing stairs: Reports Difficulty getting out of chair: Reports Difficulty using hands for taps, buttons, cutlery, and/or writing: Reports   Review of Systems  Constitutional: Positive for fatigue. Negative for night sweats, weight gain, weight loss and weakness.  HENT: Negative for mouth sores, trouble swallowing, trouble swallowing, mouth dryness and nose dryness.   Eyes: Negative for pain, redness, visual disturbance and dryness.  Respiratory: Negative for cough, shortness of breath and difficulty breathing.   Cardiovascular: Negative for chest pain, palpitations, hypertension, irregular heartbeat and swelling in legs/feet.  Gastrointestinal: Negative for blood in stool, constipation and diarrhea.  Endocrine: Negative for increased urination.  Genitourinary: Negative for vaginal dryness.  Musculoskeletal: Positive for arthralgias, joint pain, joint swelling and morning stiffness. Negative for myalgias, muscle weakness, muscle tenderness and myalgias.  Skin: Negative for  color change, rash, hair loss, skin tightness, ulcers and sensitivity to sunlight.  Allergic/Immunologic: Negative for susceptible to infections.  Neurological: Negative for dizziness, memory loss and night sweats.  Hematological: Negative for swollen glands.  Psychiatric/Behavioral: Positive for sleep disturbance. Negative for depressed mood. The patient is not nervous/anxious.     PMFS History:  Patient Active Problem List   Diagnosis Date Noted  . Primary osteoarthritis of right knee 09/25/2016  . 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 Elevated d-dimer   . Encephalopathy   . Sinus bradycardia   . Acute encephalopathy 02/27/2016  . Seropositive rheumatoid arthritis of multiple sites ([place]) 01/17/2016  . Bilateral hand pain 01/17/2016  . Bilateral wrist pain 01/17/2016  . Pain in right elbow 01/17/2016  . Left shoulder pain 01/17/2016  . Encounter for therapeutic drug monitoring 11/02/2015  . Bronchiectasis without complication ([place]) 34/74/2595  . Chronic gout involving toe without tophus 10/30/2015  . High risk medication use 10/29/2015  . Gout 10/29/2015  . H/O total knee replacement, left 10/29/2015  . 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 Diabetes mellitus, type II, insulin dependent ([place]) 10/18/2015  . Rheumatoid arthritis flare ([place]) 10/18/2015  . AKI (acute kidney injury) ([place]) 10/18/2015  . CKD (chronic kidney disease) stage 3, GFR 30-59 ml/min (HCC) 10/18/2015  . Nodule of left lung 10/18/2015  . Neuropathy 08/22/2014  . Hypothyroidism 08/22/2014  . Chronic pain 08/22/2014  . Chronic congestive heart failure with left ventricular diastolic dysfunction ([place]) 08/08/2013  . Chest pain 08/05/2013  . Aortic stenosis 12/22/2012  . Hyperlipidemia 12/22/2012  . PVC (premature ventricular contraction) 12/22/2012  .  HTN (hypertension) 12/22/2012  . 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 Open angle with borderline  findings, high risk 12/15/2011  . Cataract, nuclear 05/25/2011    Past Medical History:  Diagnosis Date  . Allergic rhinitis   . Anemia   . Aortic stenosis 12/22/2012   Mild 3/14-2.35 m/s peak velocity  . Cataract 2018  . CHF (congestive heart failure) ([place])   . Chronic pain   . CKD (chronic kidney disease), stage III ([place])   . Diabetes mellitus   . Diverticulosis   . DVT (deep venous thrombosis) ([place])   . Glaucoma   . Hemorrhoids    internal  . HTN (hypertension)   . Hypercholesteremia   . Hyperlipidemia 12/22/2012  . Hypothyroidism   . Meningioma ([place]) 2008  . Osteoarthritis   . Peripheral neuropathy   . PPD positive    6 months ago  . PVC (premature ventricular contraction) 12/22/2012  . Rheumatoid arthritis(714.0)   . Sickle cell trait ([place])   . 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 Hypotension Mother   . CVA Mother   . Diabetes Mother   . Hypertension Father   . Kidney failure Father    Past Surgical History:  Procedure Laterality Date  . ABDOMINAL HYSTERECTOMY  1978   TAH,& BSO 1  . CATARACT EXTRACTION    . fistula repair  03/2010   partial colectomy  . intracranial surgery  11/2007   removal of meningioma  . JOINT REPLACEMENT  2001   left TKR  . LEFT HEART CATHETERIZATION WITH CORONARY ANGIOGRAM N/A 08/08/2013   Procedure: LEFT HEART CATHETERIZATION WITH CORONARY ANGIOGRAM;  Surgeon: Peter M Martinique, MD;  Location: [facility] CATH LAB;  Service: Cardiovascular;  Laterality: N/A;  . ROTATOR CUFF REPAIR  1999   BIlateral  . SIGMOIDECTOMY  2012  . TOE SURGERY Left 2018  . TONSILLECTOMY AND ADENOIDECTOMY    . TOTAL THYROIDECTOMY  1992  . wrist sx     right    Social History   Social History Narrative   Daycare Provider   Drinks about 1 cup of coffee a day      Objective: Vital Signs: BP (!) 142/72 (BP Location: Right Arm, Patient Position: Sitting,  Cuff Size: Normal)   Pulse (!) 59   Resp 16   Ht 5\' 4"  (1.626 m)   Wt 199 lb (90.3 kg)   LMP  (LMP Unknown)   BMI 34.16 kg/m    Physical Exam  Constitutional: She is oriented to person, place, and time. She appears well-developed and well-nourished.  HENT:  Head: Normocephalic and atraumatic.  Eyes: Conjunctivae and EOM are normal.  Neck: Normal range of motion.  Cardiovascular: Normal rate, regular rhythm and intact distal pulses.  Murmur heard. Pulmonary/Chest: Effort normal and breath sounds normal.  Abdominal: Soft. Bowel sounds are normal.  Lymphadenopathy:    She has no cervical adenopathy.  Neurological: She is alert and oriented to person, place, and time.  Skin: Skin is warm and dry. Capillary refill takes less than 2 seconds.  Psychiatric: She has a normal mood and affect. Her behavior is normal.  Nursing note and vitals reviewed.    Musculoskeletal Exam: C-spine and thoracic lumbar spine limited range of motion with discomfort. Shoulder joints elbow joints are good range of motion. She has discomfort on palpation of bilateral elbows bilateral wrist. She  had MCP swelling bilaterally as described below. She has discomfort and pain with range of motion of her knee joints.  CDAI Exam: CDAI Homunculus Exam:   Tenderness:  RUE: ulnohumeral and radiohumeral LUE: ulnohumeral and radiohumeral Right hand: 1st MCP, 2nd MCP and 3rd MCP Left hand: 1st MCP, 2nd MCP and 3rd MCP RLE: tibiofemoral LLE: tibiofemoral  Swelling:  Right hand: 1st MCP, 2nd MCP and 3rd MCP Left hand: 1st MCP, 2nd MCP and 3rd MCP  Joint Counts:  CDAI Tender Joint count: 10 CDAI Swollen Joint count: 6  Global Assessments:  Patient Global Assessment: 8 Provider Global Assessment: 5  CDAI Calculated Score: 29    Investigation: No additional findings.uric acid: 10/13/2016 5.6, CXR 07/2016 CBC Latest Ref Rng & Units 01/12/2017 10/13/2016 08/18/2016  WBC 4.0 - 10.5 K/uL 10.2 8.0 7.6  Hemoglobin  12.0 - 15.0 g/dL 10.9(L) 9.7(L) 10.5(L)  Hematocrit 36.0 - 46.0 % 34.2(L) 30.7(L) 31.7(L)  Platelets 150 - 400 K/uL 340 309 330   CMP Latest Ref Rng & Units 01/12/2017 10/13/2016 08/18/2016  Glucose 65 - 99 mg/dL 109(H) 68 133(H)  BUN 6 - 20 mg/dL 25(H) 14 14  Creatinine 0.44 - 1.00 mg/dL 1.64(H) 1.28(H) 1.42(H)  Sodium 135 - 145 mmol/L 137 143 139  Potassium 3.5 - 5.1 mmol/L 4.7 3.5 4.2  Chloride 101 - 111 mmol/L 107 110 106  CO2 22 - 32 mmol/L 23 23 22   Calcium 8.9 - 10.3 mg/dL 9.6 9.3 9.8  Total Protein 6.5 - 8.1 g/dL 7.6 7.0 7.7  Total Bilirubin 0.3 - 1.2 mg/dL 0.9 0.8 0.9  Alkaline Phos 38 - 126 U/L 93 85 80  AST 15 - 41 U/L 20 18 26   ALT 14 - 54 U/L 12(L) 12(L) 13(L)    Imaging: No results found.  Speciality Comments: Orencia 750mg  IV  every 4 weeks TB gold has been positive in the past, patient has yearly chest xrays    Procedures:  No procedures performed Allergies: Methotrexate derivatives; Other; Penicillins; Remicade [infliximab]; and Zocor [simvastatin]   Assessment / Plan:     Visit Diagnoses: Rheumatoid arthritis of multiple sites with negative rheumatoid factor ([place]): She has severe arthritis with active synovitis on examination.. Detailed discussion regarding different treatment options. She has done well on Armstrong in the past. It was discontinued due to the cost. We checked with the patient assistance today and her Price for  Jolee Ewing will be cheaper than the Plaquenil.handout was given consent was taken. She will discontinue Plaquenil. And add Arava 10 mg by mouth daily. We will check labs in 1 month if the labs are stable I'll increase Arava to 20 mg by mouth daily. She will need labs again in 1 month and then every 2 months to monitor for drug toxicity.  High risk medication use - Orencia IV, Plaquenil 200 mg qd(09/2016) ( Arava stopped it due to the cost, methotrexate caused elevated creatinine and LFTs,Remicade-inadequate response)  Idiopathic chronic gout of  multiple sites without tophus - allopurinol uric acid: 10/13/2016 5.6. She has not had a gout flare in long time. I'll check her uric acid with next labs.  Primary osteoarthritis of right knee:chronic pain  H/O total knee replacement, left  Positive QuantiFERON-TB Gold test/ patient has yearly chest xrays   DDD (degenerative disc disease), lumbar  Chronic congestive heart failure with left ventricular diastolic dysfunction (HCC)  Mixed hyperlipidemia  Diabetes mellitus, type II, insulin dependent (HCC)  History of hypertension  CKD (chronic kidney disease) stage 3,  GFR 30-59 ml/min (HCC)  Nodule of left lung    Orders: No orders of the defined types were placed in this encounter.  No orders of the defined types were placed in this encounter.   Face-to-face time spent with patient was 30 minutes. Greater than 50% of time was spent in counseling and coordination of care.  Follow-Up Instructions: Return in about 2 months (around 04/03/2017) for Rheumatoid arthritis, Gout, Osteoarthritis.   Bo Merino, MD  Note - This record has been created using Editor, commissioning.  Chart creation errors have been sought, but may not always  have been located. Such creation errors do not reflect on  the standard of medical care.

## 2017-02-03 ENCOUNTER — Encounter: Payer: Self-pay | Admitting: Rheumatology

## 2017-02-03 ENCOUNTER — Encounter: Payer: Self-pay | Admitting: Cardiology

## 2017-02-03 ENCOUNTER — Ambulatory Visit (INDEPENDENT_AMBULATORY_CARE_PROVIDER_SITE_OTHER): Payer: Medicare Other | Admitting: Rheumatology

## 2017-02-03 ENCOUNTER — Ambulatory Visit (INDEPENDENT_AMBULATORY_CARE_PROVIDER_SITE_OTHER): Payer: Medicare Other | Admitting: Cardiology

## 2017-02-03 ENCOUNTER — Telehealth: Payer: Self-pay

## 2017-02-03 VITALS — BP 148/64 | HR 59 | Ht 64.0 in | Wt 197.1 lb

## 2017-02-03 VITALS — BP 142/72 | HR 59 | Resp 16 | Ht 64.0 in | Wt 199.0 lb

## 2017-02-03 DIAGNOSIS — M1A09X0 Idiopathic chronic gout, multiple sites, without tophus (tophi): Secondary | ICD-10-CM

## 2017-02-03 DIAGNOSIS — R911 Solitary pulmonary nodule: Secondary | ICD-10-CM

## 2017-02-03 DIAGNOSIS — N183 Chronic kidney disease, stage 3 unspecified: Secondary | ICD-10-CM

## 2017-02-03 DIAGNOSIS — E119 Type 2 diabetes mellitus without complications: Secondary | ICD-10-CM | POA: Diagnosis not present

## 2017-02-03 DIAGNOSIS — Z8679 Personal history of other diseases of the circulatory system: Secondary | ICD-10-CM

## 2017-02-03 DIAGNOSIS — I10 Essential (primary) hypertension: Secondary | ICD-10-CM | POA: Diagnosis not present

## 2017-02-03 DIAGNOSIS — Z96652 Presence of left artificial knee joint: Secondary | ICD-10-CM | POA: Diagnosis not present

## 2017-02-03 DIAGNOSIS — R7612 Nonspecific reaction to cell mediated immunity measurement of gamma interferon antigen response without active tuberculosis: Secondary | ICD-10-CM

## 2017-02-03 DIAGNOSIS — E7800 Pure hypercholesterolemia, unspecified: Secondary | ICD-10-CM | POA: Diagnosis not present

## 2017-02-03 DIAGNOSIS — Z79899 Other long term (current) drug therapy: Secondary | ICD-10-CM

## 2017-02-03 DIAGNOSIS — Z794 Long term (current) use of insulin: Secondary | ICD-10-CM

## 2017-02-03 DIAGNOSIS — M0609 Rheumatoid arthritis without rheumatoid factor, multiple sites: Secondary | ICD-10-CM | POA: Diagnosis not present

## 2017-02-03 DIAGNOSIS — E782 Mixed hyperlipidemia: Secondary | ICD-10-CM | POA: Diagnosis not present

## 2017-02-03 DIAGNOSIS — M5136 Other intervertebral disc degeneration, lumbar region: Secondary | ICD-10-CM

## 2017-02-03 DIAGNOSIS — I5032 Chronic diastolic (congestive) heart failure: Secondary | ICD-10-CM | POA: Diagnosis not present

## 2017-02-03 DIAGNOSIS — M1711 Unilateral primary osteoarthritis, right knee: Secondary | ICD-10-CM | POA: Diagnosis not present

## 2017-02-03 DIAGNOSIS — I350 Nonrheumatic aortic (valve) stenosis: Secondary | ICD-10-CM | POA: Diagnosis not present

## 2017-02-03 NOTE — Patient Instructions (Signed)

## 2017-02-03 NOTE — Telephone Encounter (Addendum)
Schedule follow up appointment in 2 months (can do 2 month labs at that appointment).   Left message on machine for patient to call the office.

## 2017-02-03 NOTE — Patient Instructions (Signed)
Standing Labs We placed an order today for your standing lab work.    Please come back and get your standing labs in April and every 3 months  We have open lab Monday through Friday from 8:30-11:30 AM and 1:30-4 PM at the office of Dr. Melessa Cowell.   The office is located at [address], [address], [place], [place] [address] No appointment is necessary.   Labs are drawn by [facility].  You may receive a bill from [facility] for your lab work. If you have any questions regarding directions or hours of operation,  please call [phone].    

## 2017-02-03 NOTE — Telephone Encounter (Signed)
Arava 10 mg by mouth daily. We will check labs in 1 month if the labs are stable I'll increase Arava to 20 mg by mouth daily. She will need labs again in 1 month and then every 2 months to monitor for drug toxicity

## 2017-02-03 NOTE — Progress Notes (Signed)
[place]. [address], [address] [place], [place]  [address] Phone: [phone] Fax:  [phone]  Date:  02/03/2017   ID:  [name], DOB April 02, 1940, MRN [id number]  PCP:  Jonathon Jordan, MD   History of Present Illness: [name] is a 77 y.o. female with history of carotid artery disease, with bilateral ICA stenosis of up to 40%, mild aortic insufficiency/stenosis, diastolic dysfunction and type 2 diabetes. Had heart catheterization on 08/08/13 which was normal. EF 70%, mild aortic stenosis on echocardiogram EKG - nonspecific ST-T wave changes. Patient has fairly good functional status. Previously she had an episode of syncope where she was standing in the kitchen/working felt dizzy fell then returned to consciousness as she hit the floor. currently she describes no significant warning. She woke up and was in her usual state. She did need some help getting up off of the floor. No diaphoresis. She is a Restaurant manager, fast food. Hemoglobin 12.0, creatinine 1.36.   05/07/15-feels like heart racing and stops. PVCs previously diagnosed on event monitor. No syncope. No chest pain. DVT 2015 now off Xarelto. Right arm DVT.   Cardiac catheterization 08/08/13-normal coronary arteries  Event monitor 2/14 showed no adverse arrhythmias.PVC's  Nuclear stress test: 03/05/12-low risk, no ischemia, normal EF  Carotid 02/2016 Bilateral: mild to moderate mixed plaque distal CCA and origin and proximal ICA and ECA. 1-39% ICA stenosis. Vertebral artery flow is Antegrade.  Echocardiogram: 08/08/13 - EF 70%, mild aortic stenosis-  ECHO 02/2016 - Left ventricle: The cavity size was normal. Systolic function was   normal. The estimated ejection fraction was in the range of 60%   to 65%. Wall motion was normal; there were no regional wall   motion abnormalities. Left ventricular diastolic function   parameters were normal. - Aortic valve: There was mild stenosis. There was mild   regurgitation. Peak  velocity (S): 252 cm/s. Mean gradient (S): 15   mm Hg. Valve area (VTI): 1.78 cm^2. Valve area (Vmax): 1.42 cm^2.   Valve area (Vmean): 1.23 cm^2. Regurgitation pressure half-time:   595 ms. - Mitral valve: Calcification of the anterior leaflet. There was   mild regurgitation. - Left atrium: The atrium was moderately dilated. - Right ventricle: The cavity size was normal. Wall thickness was   normal. Systolic function was normal. - Atrial septum: No defect or patent foramen ovale was identified. - Tricuspid valve: There was trivial regurgitation. - Pulmonary arteries: Systolic pressure was within the normal   range. PA peak pressure: 19 mm Hg (S). Fell on left shoulder. Bruise left upper arm. She caught her foot on a table and had a mechanical fall. No syncope. No chest pain. Her left neck hurt slightly.    Wt Readings from Last 3 Encounters:  02/03/17 197 lb 1.9 oz (89.4 kg)  02/03/17 199 lb (90.3 kg)  01/12/17 178 lb (80.7 kg)     Past Medical History:  Diagnosis Date  . Allergic rhinitis   . Anemia   . Aortic stenosis 12/22/2012   Mild 3/14-2.35 m/s peak velocity  . Cataract 2018  . CHF (congestive heart failure) ([place])   . Chronic pain   . CKD (chronic kidney disease), stage III ([place])   . Diabetes mellitus   . Diverticulosis   . DVT (deep venous thrombosis) ([place])   . Glaucoma   . Hemorrhoids    internal  . HTN (hypertension)   . Hypercholesteremia   . Hyperlipidemia 12/22/2012  . Hypothyroidism   .  Meningioma ([place]) 2008  . Osteoarthritis   . Peripheral neuropathy   . PPD positive    6 months ago  . PVC (premature ventricular contraction) 12/22/2012  . Rheumatoid arthritis(714.0)   . Sickle cell trait ([place])   . 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 ABDOMINAL HYSTERECTOMY  1978   TAH,& BSO 1  . CATARACT EXTRACTION    . fistula repair  03/2010    partial colectomy  . intracranial surgery  11/2007   removal of meningioma  . JOINT REPLACEMENT  2001   left TKR  . LEFT HEART CATHETERIZATION WITH CORONARY ANGIOGRAM N/A 08/08/2013   Procedure: LEFT HEART CATHETERIZATION WITH CORONARY ANGIOGRAM;  Surgeon: Peter M Martinique, MD;  Location: [facility] CATH LAB;  Service: Cardiovascular;  Laterality: N/A;  . ROTATOR CUFF REPAIR  1999   BIlateral  . SIGMOIDECTOMY  2012  . TOE SURGERY Left 2018  . TONSILLECTOMY AND ADENOIDECTOMY    . TOTAL THYROIDECTOMY  1992  . wrist sx     right     Current Outpatient Medications  Medication Sig Dispense Refill  . Abatacept (ORENCIA IV) Inject into the vein. Daughters are not sure of doses. Received in the short stay area once a month    . acetaminophen (TYLENOL) 500 MG tablet Take 1,000 mg by mouth every 6 (six) hours as needed for mild pain.    Marland Kitchen allopurinol (ZYLOPRIM) 300 MG tablet Take 1 tablet (300 mg total) by mouth daily. 30 tablet 5  . aspirin EC 81 MG EC tablet Take 1 tablet (81 mg total) by mouth daily. 30 tablet 0  . atorvastatin (LIPITOR) 80 MG tablet     . BD INSULIN SYRINGE ULTRAFINE 31G X 15/64" 0.3 ML MISC     . cholecalciferol (VITAMIN D) 1000 UNITS tablet Take 1,000 Units by mouth daily.     . clotrimazole-betamethasone (LOTRISONE) cream     . cyclobenzaprine (FLEXERIL) 10 MG tablet Take 10 mg by mouth at bedtime.     . ferrous sulfate (FEOSOL) 325 (65 FE) MG tablet Take 325-650 mg by mouth daily with breakfast.    . folic acid (FOLVITE) 1 MG tablet Take 1 mg by mouth daily.    Marland Kitchen HYDROcodone-acetaminophen (NORCO) 5-325 MG tablet Take 1 tablet by mouth every 6 (six) hours as needed for moderate pain. 21 tablet 0  . hydroxychloroquine (PLAQUENIL) 200 MG tablet Take 1 tablet (200 mg total) by mouth daily. 30 tablet 2  . insulin NPH-regular Human (NOVOLIN 70/30) (70-30) 100 UNIT/ML injection Inject 6-8 Units into the skin 3 (three) times daily with meals. Sliding source    . levothyroxine (SYNTHROID,  LEVOTHROID) 175 MCG tablet Take 175 mcg by mouth daily before breakfast.     . losartan (COZAAR) 50 MG tablet Take 50 mg by mouth every evening.    . multivitamin (THERAGRAN) per tablet Take 1 tablet by mouth daily.     . pregabalin (LYRICA) 75 MG capsule 1 bid 28 capsule 0  . VITAMIN E PO Take 1 tablet by mouth daily.     Current Facility-Administered Medications  Medication Dose Route Frequency Provider Last Rate Last Dose  . triamcinolone acetonide (KENALOG) 10 MG/ML injection 10 mg  10 mg Other Once Landis Martins, DPM      . triamcinolone acetonide (KENALOG) 10 MG/ML injection 10 mg  10 mg Other Once  Landis Martins, DPM       She is not currently on Xarelto.  Allergies:    Allergies  Allergen Reactions  . Methotrexate Derivatives Other (See Comments)    Elevated creat. Levels  . Other Other (See Comments)    NO Blood products.  [name]- causes low blood sugar (pt states she was taking medication but cant remember what it was for.)  . Penicillins Rash    Has patient had a PCN reaction causing immediate rash, facial/tongue/throat swelling, SOB or lightheadedness with hypotension: Yes Has patient had a PCN reaction causing severe rash involving mucus membranes or skin necrosis: Yes  Has patient had a PCN reaction that required hospitalization No Has patient had a PCN reaction occurring within the last 10 years: No If all of the above answers are "NO", then may proceed with Cephalosporin use.   . Remicade [Infliximab] Other (See Comments)    Inadequate response   . Zocor [Simvastatin] Other (See Comments)    Leg cramps     Social History:  The patient  reports that she quit smoking about 55 years ago. Her smoking use included cigarettes. She has a 0.20 pack-year smoking history. she has never used smokeless tobacco. She reports that she drinks alcohol. She reports that she does not use drugs.   ROS:  Please see the history of present illness.  Rash on hands.  Denies any  bleeding, syncope, orthopnea, PND, no shortness of breath.    PHYSICAL EXAM: VS:  BP (!) 148/64   Pulse (!) 59   Ht 5\' 4"  (1.626 m)   Wt 197 lb 1.9 oz (89.4 kg)   LMP  (LMP Unknown)   SpO2 97%   BMI 33.84 kg/m  Well nourished, well developed, in no acute distress  HEENT: normal  Neck: no JVD  Cardiac:  normal S1, S2; RRR; 2/6 systolic murmur, no change Lungs:  clear to auscultation bilaterally, no wheezing, rhonchi or rales  Abd: soft, nontender, no hepatomegaly  Ext: no edema  Skin: c/d/i Neuro: no focal abnormalities noted, speech difficulty, no change  EKG:  None today.      Creatinine has ranged from 1.28-1.64.  ASSESSMENT AND PLAN:  1. Mild aortic stenosis-continue to monitor clinically. Exercise.  Monitor blood pressure. ECHO 2018 stable.  2. Hyperlipidemia-continue with atorvastatin. This is being monitored by Dr. Stephanie Acre. LDL 90 3. Hypertension-currently well controlled on angiotensin receptor blocker. stable 4. PVCs-seen on event monitor. Benign. I will give her metoprolol 25 mg twice a day when necessary. She is still feeling at times palpitations. No change 5. Syncope-no further occurrences. Cardiac workup reassuring. Discussed again.  6. History of chest pain-cardiac catheterization reassuring.    One year follow up.  Signed, Candee Furbish, MD [facility]  02/03/2017 4:41 PM

## 2017-02-03 NOTE — Telephone Encounter (Signed)
Spoke with pt at her office visit. She was concerned about the price of Arava. Called her insurance for a BIV. Spoke with representative who states that the pt has retail and mail order benefits through [place].   Arava 10mg  QD Retail: $5 for #30/ $15 for #90 Mail Order through [facility]: $10 for #90  Plaquenil 200mg  QD Retail: $5 for #30/ $15 for #90 Mail Order through [facility]; $10 for #90  Patient must pay $5,000 before she will hit the catastrophic phase making her medication $0 co-pay.   Called pt to update. Left message.   Pt has consented to [place]. She will need a standing lab order and we need to know what pharmacy to send her medication to.   Baila Rouse, [place], CPhT 3:34 PM

## 2017-02-05 ENCOUNTER — Other Ambulatory Visit: Payer: Self-pay | Admitting: *Deleted

## 2017-02-05 MED ORDER — LEFLUNOMIDE 10 MG PO TABS: ORAL_TABLET | ORAL | 0 refills | Status: DC

## 2017-02-05 NOTE — Addendum Note (Signed)
Addended by: Carole Binning on: 02/05/2017 11:31 AM   Modules accepted: Orders

## 2017-02-05 NOTE — Telephone Encounter (Signed)
Spoke with pt. She would like her Rx for Arava to be sent to [facility] for 90/$10. Please send Rx and schedule 2 month appointment. Thanks!  Harla Mensch, [place], CPhT 9:46 AM

## 2017-02-05 NOTE — Telephone Encounter (Signed)
Prescription sent to the pharmacy for patient and appointment scheduled for 2 month follow up 03/31/17.

## 2017-02-05 NOTE — Progress Notes (Signed)
Infusion orders are current for patient CBC CMP Tylenol Benadryl appointments are up to date and follow up appointment  is scheduled TB gold not due yet.  

## 2017-02-06 ENCOUNTER — Other Ambulatory Visit (HOSPITAL_COMMUNITY): Payer: Self-pay | Admitting: *Deleted

## 2017-02-09 ENCOUNTER — Ambulatory Visit (HOSPITAL_COMMUNITY)
Admission: RE | Admit: 2017-02-09 | Discharge: 2017-02-09 | Disposition: A | Discharge status: Home / Self Care | Payer: Medicare Other | Source: Ambulatory Visit | Attending: Rheumatology | Admitting: Rheumatology

## 2017-02-09 DIAGNOSIS — M0609 Rheumatoid arthritis without rheumatoid factor, multiple sites: Secondary | ICD-10-CM | POA: Insufficient documentation

## 2017-02-09 MED ORDER — SODIUM CHLORIDE 0.9 % IV SOLN
750.0000 mg | Freq: Once | INTRAVENOUS | Status: AC
  Administered 2017-02-09: 750 mg via INTRAVENOUS
  Filled 2017-02-09: qty 30

## 2017-02-09 MED ORDER — ACETAMINOPHEN 325 MG PO TABS
650.0000 mg | ORAL_TABLET | Freq: Once | ORAL | Status: AC
  Administered 2017-02-09: 650 mg via ORAL

## 2017-02-09 MED ORDER — DIPHENHYDRAMINE HCL 25 MG PO CAPS
ORAL_CAPSULE | ORAL | Status: AC
  Filled 2017-02-09: qty 1

## 2017-02-09 MED ORDER — ACETAMINOPHEN 325 MG PO TABS
ORAL_TABLET | ORAL | Status: AC
  Filled 2017-02-09: qty 2

## 2017-02-09 MED ORDER — DIPHENHYDRAMINE HCL 25 MG PO CAPS
25.0000 mg | ORAL_CAPSULE | Freq: Once | ORAL | Status: AC
  Administered 2017-02-09: 25 mg via ORAL

## 2017-02-16 DIAGNOSIS — M0570 Rheumatoid arthritis with rheumatoid factor of unspecified site without organ or systems involvement: Secondary | ICD-10-CM | POA: Diagnosis not present

## 2017-02-16 DIAGNOSIS — N183 Chronic kidney disease, stage 3 (moderate): Secondary | ICD-10-CM | POA: Diagnosis not present

## 2017-02-16 DIAGNOSIS — G894 Chronic pain syndrome: Secondary | ICD-10-CM | POA: Diagnosis not present

## 2017-02-16 DIAGNOSIS — Z794 Long term (current) use of insulin: Secondary | ICD-10-CM | POA: Diagnosis not present

## 2017-02-16 DIAGNOSIS — E039 Hypothyroidism, unspecified: Secondary | ICD-10-CM | POA: Diagnosis not present

## 2017-02-16 DIAGNOSIS — Z0000 Encounter for general adult medical examination without abnormal findings: Secondary | ICD-10-CM | POA: Diagnosis not present

## 2017-02-16 DIAGNOSIS — D638 Anemia in other chronic diseases classified elsewhere: Secondary | ICD-10-CM | POA: Diagnosis not present

## 2017-02-16 DIAGNOSIS — K912 Postsurgical malabsorption, not elsewhere classified: Secondary | ICD-10-CM | POA: Diagnosis not present

## 2017-02-16 DIAGNOSIS — Z79899 Other long term (current) drug therapy: Secondary | ICD-10-CM | POA: Diagnosis not present

## 2017-02-16 DIAGNOSIS — D692 Other nonthrombocytopenic purpura: Secondary | ICD-10-CM | POA: Diagnosis not present

## 2017-02-16 DIAGNOSIS — E1142 Type 2 diabetes mellitus with diabetic polyneuropathy: Secondary | ICD-10-CM | POA: Diagnosis not present

## 2017-02-16 DIAGNOSIS — I10 Essential (primary) hypertension: Secondary | ICD-10-CM | POA: Diagnosis not present

## 2017-02-24 ENCOUNTER — Ambulatory Visit: Payer: Medicare Other | Admitting: Rheumatology

## 2017-02-24 ENCOUNTER — Encounter: Payer: Self-pay | Admitting: Sports Medicine

## 2017-02-24 ENCOUNTER — Ambulatory Visit (INDEPENDENT_AMBULATORY_CARE_PROVIDER_SITE_OTHER): Payer: Medicare Other | Admitting: Sports Medicine

## 2017-02-24 DIAGNOSIS — B351 Tinea unguium: Secondary | ICD-10-CM

## 2017-02-24 DIAGNOSIS — L84 Corns and callosities: Secondary | ICD-10-CM

## 2017-02-24 DIAGNOSIS — E1140 Type 2 diabetes mellitus with diabetic neuropathy, unspecified: Secondary | ICD-10-CM

## 2017-02-24 DIAGNOSIS — M79675 Pain in left toe(s): Secondary | ICD-10-CM

## 2017-02-24 DIAGNOSIS — Z794 Long term (current) use of insulin: Secondary | ICD-10-CM

## 2017-02-24 DIAGNOSIS — M79674 Pain in right toe(s): Secondary | ICD-10-CM | POA: Diagnosis not present

## 2017-02-24 NOTE — Progress Notes (Signed)
Patient ID: [name], female   DOB: [birth year], 77 y.o.   MRN: [id number]  Subjective: [name] is a 77 y.o. female patient with history of type 2 diabetes who returns to office today complaining of long, painful nails and callus while ambulating in shoes; unable to trim. Patient states that the glucose reading this morning was not recorded. Patient last A1c was 6.5. Patient denies any other symptoms.   Patient Active Problem List   Diagnosis Date Noted  . Primary osteoarthritis of right knee 09/25/2016  . 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 Elevated d-dimer   . Encephalopathy   . Sinus bradycardia   . Acute encephalopathy 02/27/2016  . Seropositive rheumatoid arthritis of multiple sites ([place]) 01/17/2016  . Bilateral hand pain 01/17/2016  . Bilateral wrist pain 01/17/2016  . Pain in right elbow 01/17/2016  . Left shoulder pain 01/17/2016  . Encounter for therapeutic drug monitoring 11/02/2015  . Bronchiectasis without complication ([place]) 31/54/0086  . Chronic gout involving toe without tophus 10/30/2015  . High risk medication use 10/29/2015  . Gout 10/29/2015  . H/O total knee replacement, left 10/29/2015  . 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 Diabetes mellitus, type II, insulin dependent ([place]) 10/18/2015  . Rheumatoid arthritis flare ([place]) 10/18/2015  . AKI (acute kidney injury) ([place]) 10/18/2015  . CKD (chronic kidney disease) stage 3, GFR 30-59 ml/min (HCC) 10/18/2015  . Nodule of left lung 10/18/2015  . Neuropathy 08/22/2014  . Hypothyroidism 08/22/2014  . Chronic pain 08/22/2014  . Chronic congestive heart failure with left ventricular diastolic dysfunction ([place]) 08/08/2013  . Chest pain 08/05/2013  . Aortic stenosis 12/22/2012  . Hyperlipidemia 12/22/2012  . PVC (premature ventricular contraction) 12/22/2012  . HTN (hypertension) 12/22/2012  . 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 Open  angle with borderline findings, high risk 12/15/2011  . Cataract, nuclear 05/25/2011   Current Outpatient Medications on File Prior to Visit  Medication Sig Dispense Refill  . Abatacept (ORENCIA IV) Inject into the vein. Daughters are not sure of doses. Received in the short stay area once a month    . acetaminophen (TYLENOL) 500 MG tablet Take 1,000 mg by mouth every 6 (six) hours as needed for mild pain.    Marland Kitchen aspirin EC 81 MG EC tablet Take 1 tablet (81 mg total) by mouth daily. 30 tablet 0  . atorvastatin (LIPITOR) 80 MG tablet     . BD INSULIN SYRINGE ULTRAFINE 31G X 15/64" 0.3 ML MISC     . cholecalciferol (VITAMIN D) 1000 UNITS tablet Take 1,000 Units by mouth daily.     . clotrimazole-betamethasone (LOTRISONE) cream     . cyclobenzaprine (FLEXERIL) 10 MG tablet Take 10 mg by mouth at bedtime.     . ferrous sulfate (FEOSOL) 325 (65 FE) MG tablet Take 325-650 mg by mouth daily with breakfast.    . folic acid (FOLVITE) 1 MG tablet Take 1 mg by mouth daily.    Marland Kitchen HYDROcodone-acetaminophen (NORCO) 5-325 MG tablet Take 1 tablet by mouth every 6 (six) hours as needed for moderate pain. 21 tablet 0  . hydroxychloroquine (PLAQUENIL) 200 MG tablet Take 1 tablet (200 mg total) by mouth daily. 30 tablet 2  . insulin NPH-regular Human (NOVOLIN 70/30) (70-30) 100 UNIT/ML injection Inject 6-8 Units into the skin 3 (three) times daily with meals. Sliding source    . leflunomide (ARAVA) 10 MG tablet Take 1 tablet by mouth for 1 month  if labs are stable increase to 2 tablets by mouth daily. 150 tablet 0  . levothyroxine (SYNTHROID, LEVOTHROID) 175 MCG tablet Take 175 mcg by mouth daily before breakfast.     . losartan (COZAAR) 50 MG tablet Take 50 mg by mouth every evening.    . multivitamin (THERAGRAN) per tablet Take 1 tablet by mouth daily.     . pregabalin (LYRICA) 75 MG capsule 1 bid 28 capsule 0  . VITAMIN E PO Take 1 tablet by mouth daily.    Marland Kitchen allopurinol (ZYLOPRIM) 300 MG tablet Take 1 tablet  (300 mg total) by mouth daily. 30 tablet 5   Current Facility-Administered Medications on File Prior to Visit  Medication Dose Route Frequency Provider Last Rate Last Dose  . triamcinolone acetonide (KENALOG) 10 MG/ML injection 10 mg  10 mg Other Once Landis Martins, DPM      . triamcinolone acetonide (KENALOG) 10 MG/ML injection 10 mg  10 mg Other Once Landis Martins, DPM       Allergies  Allergen Reactions  . Methotrexate Derivatives Other (See Comments)    Elevated creat. Levels  . Other Other (See Comments)    NO Blood products.  [name]- causes low blood sugar (pt states she was taking medication but cant remember what it was for.)  . Penicillins Rash    Has patient had a PCN reaction causing immediate rash, facial/tongue/throat swelling, SOB or lightheadedness with hypotension: Yes Has patient had a PCN reaction causing severe rash involving mucus membranes or skin necrosis: Yes  Has patient had a PCN reaction that required hospitalization No Has patient had a PCN reaction occurring within the last 10 years: No If all of the above answers are "NO", then may proceed with Cephalosporin use.   . Remicade [Infliximab] Other (See Comments)    Inadequate response   . Zocor [Simvastatin] Other (See Comments)    Leg cramps     Objective: General: Patient is awake, alert, and oriented x 3 and in no acute distress.  Integument: Skin is warm, dry and supple bilateral. Nails are tender, long, thickened and dystrophic with subungual debris, consistent with onychomycosis, 1-5 bilateral. No open lesions. + callus/ preulcerative lesions present at left 4th and  right 4th toe distal tuft (mostly involved as previous) with dry blood, with no signs of infection. Remaining integument unremarkable.  Vasculature:  Dorsalis Pedis pulse 1/4 bilateral. Posterior Tibial pulse  1/4 bilateral. Capillary fill time <3 sec 1-5 bilateral. Scant hair growth to the level of the digits.Temperature  gradient within normal limits. No varicosities present bilateral. Trace edema present bilateral.   Neurology: The patient has diminished sensation measured with a 5.07/10g Semmes Weinstein Monofilament at all pedal sites bilateral . Vibratory sensation diminished bilateral with tuning fork. No Babinski sign present bilateral.   Musculoskeletal: +Bunion and hammertoe pedal deformities noted bilateral. Pes planus foot type. Muscular strength 5/5 in all lower extremity muscular groups bilateral. No tenderness with calf compression bilateral.   Assessment and Plan: Problem List Items Addressed This Visit    None    Visit Diagnoses    Pain due to onychomycosis of toenails of both feet    -  Primary   Pre-ulcerative calluses       Type 2 diabetes mellitus with diabetic neuropathy, with long-term current use of insulin ([place])         -Examined patient. -Discussed and educated patient on diabetic foot care, especially with  regards to the vascular, neurological and musculoskeletal systems.  -  Stressed the importance of good glycemic control and the detriment of not controlling glucose levels in relation to the foot. -Mechanically debrided all nails 1-5 bilateral using sterile nail nipper and filed with dremel without incident  -Debrided callus at right and left 4th toes using sterile chisel blade without incident. -ABN Signed -Continue with good supportive shoes; [employer] diabetic shoe order form was completed; office to contact primary care for approval / certification;  Office to arrange shoe fitting and dispensing. -Continue with cane on right for stability in gait -Answered all patient questions -Patient to return as in 10-12 weeks for nail and callus care  -Patient advised to call the office if any problems or questions arise in the meantime.  Landis Martins, DPM

## 2017-03-03 DIAGNOSIS — B349 Viral infection, unspecified: Secondary | ICD-10-CM | POA: Diagnosis not present

## 2017-03-03 DIAGNOSIS — M069 Rheumatoid arthritis, unspecified: Secondary | ICD-10-CM | POA: Diagnosis not present

## 2017-03-03 DIAGNOSIS — D649 Anemia, unspecified: Secondary | ICD-10-CM | POA: Diagnosis not present

## 2017-03-03 DIAGNOSIS — E119 Type 2 diabetes mellitus without complications: Secondary | ICD-10-CM | POA: Diagnosis not present

## 2017-03-06 ENCOUNTER — Other Ambulatory Visit (HOSPITAL_COMMUNITY): Payer: Self-pay | Admitting: *Deleted

## 2017-03-06 ENCOUNTER — Telehealth: Payer: Self-pay | Admitting: Rheumatology

## 2017-03-06 ENCOUNTER — Other Ambulatory Visit: Payer: Self-pay | Admitting: *Deleted

## 2017-03-06 DIAGNOSIS — M069 Rheumatoid arthritis, unspecified: Secondary | ICD-10-CM

## 2017-03-06 NOTE — Telephone Encounter (Signed)
[name] from [facility] called asking if [name]'s orders for her [name] can be entered today.  Patient is scheduled for IV on Monday at 12:00 pm.

## 2017-03-06 NOTE — Telephone Encounter (Signed)
Infusion Orders placed.

## 2017-03-09 ENCOUNTER — Ambulatory Visit (HOSPITAL_COMMUNITY)
Admission: RE | Admit: 2017-03-09 | Discharge: 2017-03-09 | Disposition: A | Discharge status: Home / Self Care | Payer: Medicare Other | Source: Ambulatory Visit | Attending: Rheumatology | Admitting: Rheumatology

## 2017-03-09 DIAGNOSIS — M069 Rheumatoid arthritis, unspecified: Secondary | ICD-10-CM | POA: Diagnosis not present

## 2017-03-09 LAB — COMPREHENSIVE METABOLIC PANEL
ALT: 15 U/L (ref 14–54)
AST: 29 U/L (ref 15–41)
Albumin: 3.8 g/dL (ref 3.5–5.0)
Alkaline Phosphatase: 111 U/L (ref 38–126)
Anion gap: 10 (ref 5–15)
BUN: 27 mg/dL — AB (ref 6–20)
CO2: 25 mmol/L (ref 22–32)
Calcium: 9.1 mg/dL (ref 8.9–10.3)
Chloride: 108 mmol/L (ref 101–111)
Creatinine, Ser: 1.56 mg/dL — ABNORMAL HIGH (ref 0.44–1.00)
GFR calc Af Amer: 36 mL/min — ABNORMAL LOW (ref >60)
GFR, EST NON AFRICAN AMERICAN: 31 mL/min — AB (ref >60)
Glucose, Bld: 121 mg/dL — ABNORMAL HIGH (ref 65–99)
POTASSIUM: 5 mmol/L (ref 3.5–5.1)
Sodium: 143 mmol/L (ref 135–145)
TOTAL PROTEIN: 7 g/dL (ref 6.5–8.1)
Total Bilirubin: 1.1 mg/dL (ref 0.3–1.2)

## 2017-03-09 LAB — CBC
HEMATOCRIT: 32.7 % — AB (ref 36.0–46.0)
Hemoglobin: 10.4 g/dL — ABNORMAL LOW (ref 12.0–15.0)
MCH: 28.7 pg (ref 26.0–34.0)
MCHC: 31.8 g/dL (ref 30.0–36.0)
MCV: 90.3 fL (ref 78.0–100.0)
Platelets: 272 10*3/uL (ref 150–400)
RBC: 3.62 MIL/uL — ABNORMAL LOW (ref 3.87–5.11)
RDW: 16.5 % — AB (ref 11.5–15.5)
WBC: 7.6 10*3/uL (ref 4.0–10.5)

## 2017-03-09 MED ORDER — ACETAMINOPHEN 325 MG PO TABS
ORAL_TABLET | ORAL | Status: AC
  Administered 2017-03-09: 12:00:00 650 mg via ORAL
  Filled 2017-03-09: qty 2

## 2017-03-09 MED ORDER — SODIUM CHLORIDE 0.9 % IV SOLN
750.0000 mg | INTRAVENOUS | Status: DC
  Administered 2017-03-09: 750 mg via INTRAVENOUS
  Filled 2017-03-09: qty 30

## 2017-03-09 MED ORDER — DIPHENHYDRAMINE HCL 25 MG PO CAPS
25.0000 mg | ORAL_CAPSULE | ORAL | Status: DC
  Administered 2017-03-09: 25 mg via ORAL

## 2017-03-09 MED ORDER — ACETAMINOPHEN 325 MG PO TABS
650.0000 mg | ORAL_TABLET | ORAL | Status: DC
  Administered 2017-03-09: 650 mg via ORAL

## 2017-03-09 MED ORDER — DIPHENHYDRAMINE HCL 25 MG PO CAPS
ORAL_CAPSULE | ORAL | Status: AC
  Administered 2017-03-09: 12:00:00 25 mg via ORAL
  Filled 2017-03-09: qty 1

## 2017-03-09 NOTE — Progress Notes (Signed)
stable °

## 2017-03-10 NOTE — Progress Notes (Signed)
Fax results to her PCP.

## 2017-03-18 NOTE — Progress Notes (Signed)
Office Visit Note  Patient: [name]             Date of Birth: 11/21/1940           MRN: [id number]             PCP: Jonathon Jordan, MD Referring: Jonathon Jordan, MD Visit Date: 03/31/2017 Occupation: @GUAROCC @    Subjective:  Pain in multiple joints.   History of Present Illness: [name] is a 77 y.o. female with history of rheumatoid arthritis, gout and osteoarthritis.  She states she has been experiencing increased pain and discomfort in her bilateral hands, bilateral knee joints and bilateral feet.  She had a tendon release on her left foot which has helped her to some extent.  States she continues to have swelling in her bilateral hands.  Activities of Daily Living:  Patient reports morning stiffness for 2 hours.   Patient Reports nocturnal pain.  Difficulty dressing/grooming: Denies Difficulty climbing stairs: Reports Difficulty getting out of chair: Reports Difficulty using hands for taps, buttons, cutlery, and/or writing: Reports   Review of Systems  Constitutional: Positive for fatigue. Negative for night sweats, weight gain and weight loss.  HENT: Positive for mouth dryness. Negative for mouth sores, trouble swallowing, trouble swallowing and nose dryness.   Eyes: Positive for dryness. Negative for pain, redness and visual disturbance.  Respiratory: Negative for cough, shortness of breath and difficulty breathing.   Cardiovascular: Negative for chest pain, palpitations, hypertension, irregular heartbeat and swelling in legs/feet.  Gastrointestinal: Positive for diarrhea. Negative for blood in stool and constipation.  Endocrine: Negative for increased urination.  Genitourinary: Negative for vaginal dryness.  Musculoskeletal: Positive for arthralgias, joint pain, joint swelling and morning stiffness. Negative for myalgias, muscle weakness, muscle tenderness and myalgias.  Skin: Negative for color change, rash, hair loss, skin tightness, ulcers  and sensitivity to sunlight.  Allergic/Immunologic: Negative for susceptible to infections.  Neurological: Negative for dizziness, memory loss, night sweats and weakness.  Hematological: Negative for swollen glands.  Psychiatric/Behavioral: Positive for sleep disturbance. Negative for depressed mood. The patient is not nervous/anxious.     PMFS History:  Patient Active Problem List   Diagnosis Date Noted  . Primary osteoarthritis of right knee 09/25/2016  . 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 Elevated d-dimer   . Encephalopathy   . Sinus bradycardia   . Acute encephalopathy 02/27/2016  . Seropositive rheumatoid arthritis of multiple sites ([place]) 01/17/2016  . Bilateral hand pain 01/17/2016  . Bilateral wrist pain 01/17/2016  . Pain in right elbow 01/17/2016  . Left shoulder pain 01/17/2016  . Encounter for therapeutic drug monitoring 11/02/2015  . Bronchiectasis without complication ([place]) 73/53/2992  . Chronic gout involving toe without tophus 10/30/2015  . High risk medication use 10/29/2015  . Gout 10/29/2015  . H/O total knee replacement, left 10/29/2015  . 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 Diabetes mellitus, type II, insulin dependent ([place]) 10/18/2015  . Rheumatoid arthritis flare ([place]) 10/18/2015  . AKI (acute kidney injury) ([place]) 10/18/2015  . CKD (chronic kidney disease) stage 3, GFR 30-59 ml/min (H[facility]) 10/18/2015  . Nodule of left lung 10/18/2015  . Neuropathy 08/22/2014  . Hypothyroidism 08/22/2014  . Chronic pain 08/22/2014  . Chronic congestive heart failure with left ventricular diastolic dysfunction ([place]) 08/08/2013  . Chest pain 08/05/2013  . Aortic stenosis 12/22/2012  . Hyperlipidemia 12/22/2012  . PVC (premature ventricular contraction) 12/22/2012  . HTN (hypertension) 12/22/2012  .  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 Open angle with borderline findings, high risk 12/15/2011  . Cataract,  nuclear 05/25/2011    Past Medical History:  Diagnosis Date  . Allergic rhinitis   . Anemia   . Aortic stenosis 12/22/2012   Mild 3/14-2.35 m/s peak velocity  . Cataract 2018  . CHF (congestive heart failure) ([place])   . Chronic pain   . CKD (chronic kidney disease), stage III ([place])   . Diabetes mellitus   . Diverticulosis   . DVT (deep venous thrombosis) ([place])   . Glaucoma   . Hemorrhoids    internal  . HTN (hypertension)   . Hypercholesteremia   . Hyperlipidemia 12/22/2012  . Hypothyroidism   . Meningioma ([place]) 2008  . Osteoarthritis   . Peripheral neuropathy   . PPD positive    6 months ago  . PVC (premature ventricular contraction) 12/22/2012  . Rheumatoid arthritis(714.0)   . Sickle cell trait ([place])   . 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 Hypotension Mother   . CVA Mother   . Diabetes Mother   . Hypertension Father   . Kidney failure Father    Past Surgical History:  Procedure Laterality Date  . ABDOMINAL HYSTERECTOMY  1978   TAH,& BSO 1  . CATARACT EXTRACTION    . fistula repair  03/2010   partial colectomy  . intracranial surgery  11/2007   removal of meningioma  . JOINT REPLACEMENT  2001   left TKR  . LEFT HEART CATHETERIZATION WITH CORONARY ANGIOGRAM N/A 08/08/2013   Procedure: LEFT HEART CATHETERIZATION WITH CORONARY ANGIOGRAM;  Surgeon: Peter M Martinique, MD;  Location: [facility] CATH LAB;  Service: Cardiovascular;  Laterality: N/A;  . ROTATOR CUFF REPAIR  1999   BIlateral  . SIGMOIDECTOMY  2012  . TOE SURGERY Left 2018  . TONSILLECTOMY AND ADENOIDECTOMY    . TOTAL THYROIDECTOMY  1992  . wrist sx     right    Social History   Social History Narrative   Daycare Provider   Drinks about 1 cup of coffee a day      Objective: Vital Signs: BP (!) 144/77 (BP Location: Left Arm, Patient Position: Sitting, Cuff Size: Normal)   Pulse 64   Resp 16    Ht 5\' 4"  (1.626 m)   Wt 196 lb 8 oz (89.1 kg)   LMP  (LMP Unknown)   BMI 33.73 kg/m    Physical Exam  Constitutional: She is oriented to person, place, and time. She appears well-developed and well-nourished.  HENT:  Head: Normocephalic and atraumatic.  Eyes: Conjunctivae and EOM are normal.  Neck: Normal range of motion.  Cardiovascular: Normal rate, regular rhythm, normal heart sounds and intact distal pulses.  Pulmonary/Chest: Effort normal and breath sounds normal.  Abdominal: Soft. Bowel sounds are normal.  Lymphadenopathy:    She has no cervical adenopathy.  Neurological: She is alert and oriented to person, place, and time.  Skin: Skin is warm and dry. Capillary refill takes less than 2 seconds.  Psychiatric: She has a normal mood and affect. Her behavior is normal.  Nursing note and vitals reviewed.    Musculoskeletal Exam: C-spine thoracic lumbar spine limited range of motion with discomfort.  Shoulder joints elbow joints wrist joints are good range of motion.  She is synovial thickening over bilateral MCP and PIP joints.  She had mild synovitis of her bilateral second MCP joint.  She has tenderness across the MCPs and PIPs.  Hip joints and knee joints are good range of motion.  She had left total knee replacement which is doing well.  She has some tenderness across her MTPs.  CDAI Exam: CDAI Homunculus Exam:   Tenderness:  RUE: wrist Right hand: 2nd MCP and 3rd MCP Left hand: 2nd MCP, 3rd MCP, 4th MCP, 3rd PIP and 4th PIP  Swelling:  Right hand: 2nd MCP Left hand: 2nd MCP  Joint Counts:  CDAI Tender Joint count: 8 CDAI Swollen Joint count: 2  Global Assessments:  Patient Global Assessment: 6 Provider Global Assessment: 5  CDAI Calculated Score: 21    Investigation: No additional findings. CBC Latest Ref Rng & Units 03/09/2017 01/12/2017 10/13/2016  WBC 4.0 - 10.5 K/uL 7.6 10.2 8.0  Hemoglobin 12.0 - 15.0 g/dL 10.4(L) 10.9(L) 9.7(L)  Hematocrit 36.0 - 46.0 %  32.7(L) 34.2(L) 30.7(L)  Platelets 150 - 400 K/uL 272 340 309   CMP Latest Ref Rng & Units 03/09/2017 01/12/2017 10/13/2016  Glucose 65 - 99 mg/dL 121(H) 109(H) 68  BUN 6 - 20 mg/dL 27(H) 25(H) 14  Creatinine 0.44 - 1.00 mg/dL 1.56(H) 1.64(H) 1.28(H)  Sodium 135 - 145 mmol/L 143 137 143  Potassium 3.5 - 5.1 mmol/L 5.0 4.7 3.5  Chloride 101 - 111 mmol/L 108 107 110  CO2 22 - 32 mmol/L 25 23 23   Calcium 8.9 - 10.3 mg/dL 9.1 9.6 9.3  Total Protein 6.5 - 8.1 g/dL 7.0 7.6 7.0  Total Bilirubin 0.3 - 1.2 mg/dL 1.1 0.9 0.8  Alkaline Phos 38 - 126 U/L 111 93 85  AST 15 - 41 U/L 29 20 18   ALT 14 - 54 U/L 15 12(L) 12(L)    Imaging: No results found.  Speciality Comments: Orencia 750mg  IV  every 4 weeks TB gold has been positive in the past, patient has yearly chest xrays    Procedures:  No procedures performed Allergies: Methotrexate derivatives; Other; Penicillins; Remicade [infliximab]; and Zocor [simvastatin]   Assessment / Plan:     Visit Diagnoses: Rheumatoid arthritis of multiple sites with negative rheumatoid factor [facility]): Patient has mild synovitis on examination.  She continues to have some synovial thickening and significant discomfort.  She is on IV Orencia and Arava 10 mg p.o. daily.  Due to her low GFR the choices are very limited.  We discussed possible option of Actemra or 0 but she declined.  She requests a small prednisone taper.  I will give her prednisone 10 mg for 2 days 7.5 mg for 2 days 5 mg for 2 days and 2.5 mg for 2 days.  I did make her aware that it can raise her blood pressure and blood sugar.  She will have to be careful monitoring her  blood sugar.  High risk medication use - Orencia IV,  Arava 10 mg po qd (methotrexate caused elevated creatinine and LFTs,Remicade,PLQ-inadequate response) she has low GFR.  Labs are stable.  Idiopathic chronic gout of multiple sites without tophus - allopurinol uric acid: 10/13/2016 5.6  Primary osteoarthritis of right knee: Chronic  pain  H/O total knee replacement, left: Chronic pain  Positive QuantiFERON-TB Gold test/ patient has yearly chest xrays.  She had CT chest in July 2018 by pulmonologist.  We will repeat chest x-ray at follow-up.  DDD (degenerative disc disease), lumbar: Chronic pain and discomfort.  Nodule of left lung: She is followed up by pulmonologist.  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  I have advised her to monitor blood pressure closely and follow-up with her PCP.  History of diabetes mellitus    Orders: No orders of the defined types were placed in this encounter.  Meds ordered this encounter  Medications  . allopurinol (ZYLOPRIM) 300 MG tablet    Sig: Take 1 tablet (300 mg total) by mouth daily.    Dispense:  30 tablet    Refill:  5  . predniSONE (DELTASONE) 5 MG tablet    Sig: Take 2 tabs po x 2 days, 1.5 tabs po x 2 days, 1 tab po x 2 days, 1/2 tab po x 2 days    Dispense:  10 tablet    Refill:  0    Face-to-face time spent with patient was 30 minutes.  Greater than 50% of time was spent in counseling and coordination of care.  Follow-Up Instructions: Return in about 3 months (around 07/01/2017) for Rheumatoid arthritis, Osteoarthritis, Gout.   Bo Merino, MD  Note - This record has been created using Editor, commissioning.  Chart creation errors have been sought, but may not always  have been located. Such creation errors do not reflect on  the standard of medical care.

## 2017-03-23 ENCOUNTER — Ambulatory Visit: Payer: Medicare Other | Admitting: Orthotics

## 2017-03-23 DIAGNOSIS — M2040 Other hammer toe(s) (acquired), unspecified foot: Secondary | ICD-10-CM

## 2017-03-23 DIAGNOSIS — Z794 Long term (current) use of insulin: Principal | ICD-10-CM

## 2017-03-23 DIAGNOSIS — M2142 Flat foot [pes planus] (acquired), left foot: Secondary | ICD-10-CM

## 2017-03-23 DIAGNOSIS — M19071 Primary osteoarthritis, right ankle and foot: Secondary | ICD-10-CM

## 2017-03-23 DIAGNOSIS — M2141 Flat foot [pes planus] (acquired), right foot: Secondary | ICD-10-CM

## 2017-03-23 DIAGNOSIS — E1140 Type 2 diabetes mellitus with diabetic neuropathy, unspecified: Secondary | ICD-10-CM

## 2017-03-23 NOTE — Progress Notes (Signed)
Patient presents today for diabetic shoe measurement and foam casting.  Goals of diabetic shoes/inserts to offer protection from conditions secondary to DM2, offer relief from sheer forces that could lead to ulcerations, protect the foot, and offer greater stability. Patient is under supervision of DPM [place] Physician managing patients DM2: Buddy Duty Patient has following documented conditions to qualify for diabetic shoes/inserts: DM2, PN, HT Patient measured with brannock device: 8.5W

## 2017-03-27 DIAGNOSIS — H26492 Other secondary cataract, left eye: Secondary | ICD-10-CM | POA: Diagnosis not present

## 2017-03-31 ENCOUNTER — Ambulatory Visit (INDEPENDENT_AMBULATORY_CARE_PROVIDER_SITE_OTHER): Payer: Medicare Other | Admitting: Rheumatology

## 2017-03-31 ENCOUNTER — Encounter: Payer: Self-pay | Admitting: Rheumatology

## 2017-03-31 VITALS — BP 144/77 | HR 64 | Resp 16 | Ht 64.0 in | Wt 196.5 lb

## 2017-03-31 DIAGNOSIS — R911 Solitary pulmonary nodule: Secondary | ICD-10-CM

## 2017-03-31 DIAGNOSIS — Z8679 Personal history of other diseases of the circulatory system: Secondary | ICD-10-CM | POA: Diagnosis not present

## 2017-03-31 DIAGNOSIS — M1A09X0 Idiopathic chronic gout, multiple sites, without tophus (tophi): Secondary | ICD-10-CM

## 2017-03-31 DIAGNOSIS — R7612 Nonspecific reaction to cell mediated immunity measurement of gamma interferon antigen response without active tuberculosis: Secondary | ICD-10-CM | POA: Diagnosis not present

## 2017-03-31 DIAGNOSIS — Z8639 Personal history of other endocrine, nutritional and metabolic disease: Secondary | ICD-10-CM | POA: Diagnosis not present

## 2017-03-31 DIAGNOSIS — I5032 Chronic diastolic (congestive) heart failure: Secondary | ICD-10-CM | POA: Diagnosis not present

## 2017-03-31 DIAGNOSIS — N183 Chronic kidney disease, stage 3 unspecified: Secondary | ICD-10-CM

## 2017-03-31 DIAGNOSIS — M1711 Unilateral primary osteoarthritis, right knee: Secondary | ICD-10-CM

## 2017-03-31 DIAGNOSIS — M0609 Rheumatoid arthritis without rheumatoid factor, multiple sites: Secondary | ICD-10-CM | POA: Diagnosis not present

## 2017-03-31 DIAGNOSIS — Z79899 Other long term (current) drug therapy: Secondary | ICD-10-CM | POA: Diagnosis not present

## 2017-03-31 DIAGNOSIS — Z96652 Presence of left artificial knee joint: Secondary | ICD-10-CM

## 2017-03-31 DIAGNOSIS — M5136 Other intervertebral disc degeneration, lumbar region: Secondary | ICD-10-CM

## 2017-03-31 MED ORDER — ALLOPURINOL 300 MG PO TABS: 300.0000 mg | ORAL_TABLET | Freq: Every day | ORAL | 5 refills | Status: AC

## 2017-03-31 MED ORDER — PREDNISONE 5 MG PO TABS: ORAL_TABLET | ORAL | 0 refills | Status: DC

## 2017-03-31 NOTE — Progress Notes (Signed)
Medication Samples have been provided to the patient.  Drug name: Lyrica   Strength: 75 mg     Qty: 2   LOT: [id number]  Exp.Date: 11/2018  Dosing instructions: Take on capsule BID  The patient has been instructed regarding the correct time, dose, and frequency of taking this medication, including desired effects and most common side effects.   Gwenlyn Perking 3:48 PM 03/31/2017

## 2017-04-03 ENCOUNTER — Other Ambulatory Visit: Payer: Self-pay | Admitting: *Deleted

## 2017-04-03 NOTE — Progress Notes (Signed)
Infusion orders are current for patient CBC CMP Tylenol Benadryl appointments are up to date and follow up appointment  is scheduled. Chest x-ray not due yet.

## 2017-04-06 ENCOUNTER — Encounter (HOSPITAL_COMMUNITY): Payer: Medicare Other

## 2017-04-07 DIAGNOSIS — I10 Essential (primary) hypertension: Secondary | ICD-10-CM | POA: Diagnosis not present

## 2017-04-07 DIAGNOSIS — M62830 Muscle spasm of back: Secondary | ICD-10-CM | POA: Diagnosis not present

## 2017-04-13 ENCOUNTER — Encounter (HOSPITAL_COMMUNITY)
Admission: RE | Admit: 2017-04-13 | Discharge: 2017-04-13 | Disposition: A | Discharge status: Home / Self Care | Payer: Medicare Other | Source: Ambulatory Visit | Attending: Rheumatology | Admitting: Rheumatology

## 2017-04-13 DIAGNOSIS — M069 Rheumatoid arthritis, unspecified: Secondary | ICD-10-CM | POA: Diagnosis not present

## 2017-04-13 LAB — COMPREHENSIVE METABOLIC PANEL
ALK PHOS: 80 U/L (ref 38–126)
ALT: 13 U/L — AB (ref 14–54)
ANION GAP: 12 (ref 5–15)
AST: 24 U/L (ref 15–41)
Albumin: 3.8 g/dL (ref 3.5–5.0)
BUN: 15 mg/dL (ref 6–20)
CALCIUM: 9.9 mg/dL (ref 8.9–10.3)
CHLORIDE: 106 mmol/L (ref 101–111)
CO2: 22 mmol/L (ref 22–32)
CREATININE: 1.48 mg/dL — AB (ref 0.44–1.00)
GFR, EST AFRICAN AMERICAN: 38 mL/min — AB (ref >60)
GFR, EST NON AFRICAN AMERICAN: 33 mL/min — AB (ref >60)
Glucose, Bld: 113 mg/dL — ABNORMAL HIGH (ref 65–99)
Potassium: 4.3 mmol/L (ref 3.5–5.1)
SODIUM: 140 mmol/L (ref 135–145)
Total Bilirubin: 0.5 mg/dL (ref 0.3–1.2)
Total Protein: 7.1 g/dL (ref 6.5–8.1)

## 2017-04-13 LAB — CBC
HCT: 32.4 % — ABNORMAL LOW (ref 36.0–46.0)
Hemoglobin: 10.4 g/dL — ABNORMAL LOW (ref 12.0–15.0)
MCH: 28.3 pg (ref 26.0–34.0)
MCHC: 32.1 g/dL (ref 30.0–36.0)
MCV: 88 fL (ref 78.0–100.0)
PLATELETS: 303 10*3/uL (ref 150–400)
RBC: 3.68 MIL/uL — AB (ref 3.87–5.11)
RDW: 14.9 % (ref 11.5–15.5)
WBC: 7.2 10*3/uL (ref 4.0–10.5)

## 2017-04-13 MED ORDER — DIPHENHYDRAMINE HCL 25 MG PO CAPS
25.0000 mg | ORAL_CAPSULE | ORAL | Status: DC
  Administered 2017-04-13: 25 mg via ORAL

## 2017-04-13 MED ORDER — ACETAMINOPHEN 325 MG PO TABS: 650.0000 mg | ORAL_TABLET | ORAL | Status: DC

## 2017-04-13 MED ORDER — SODIUM CHLORIDE 0.9 % IV SOLN
750.0000 mg | INTRAVENOUS | Status: DC
  Administered 2017-04-13: 12:00:00 750 mg via INTRAVENOUS
  Filled 2017-04-13: qty 30

## 2017-04-13 MED ORDER — DIPHENHYDRAMINE HCL 25 MG PO CAPS
ORAL_CAPSULE | ORAL | Status: AC
  Administered 2017-04-13: 25 mg via ORAL
  Filled 2017-04-13: qty 1

## 2017-04-13 NOTE — Progress Notes (Signed)
Labs are stable.

## 2017-04-27 DIAGNOSIS — H26491 Other secondary cataract, right eye: Secondary | ICD-10-CM | POA: Diagnosis not present

## 2017-05-05 ENCOUNTER — Ambulatory Visit: Payer: Medicare Other | Admitting: Sports Medicine

## 2017-05-05 ENCOUNTER — Other Ambulatory Visit: Payer: Self-pay | Admitting: *Deleted

## 2017-05-05 NOTE — Progress Notes (Signed)
Infusion orders are current for patient CBC CMP Tylenol Benadryl appointments are up to date and follow up appointment.

## 2017-05-08 ENCOUNTER — Ambulatory Visit (HOSPITAL_COMMUNITY)
Admission: RE | Admit: 2017-05-08 | Discharge: 2017-05-08 | Disposition: A | Discharge status: Home / Self Care | Payer: Medicare Other | Source: Ambulatory Visit | Attending: Rheumatology | Admitting: Rheumatology

## 2017-05-08 DIAGNOSIS — M069 Rheumatoid arthritis, unspecified: Secondary | ICD-10-CM | POA: Diagnosis not present

## 2017-05-08 MED ORDER — DIPHENHYDRAMINE HCL 25 MG PO CAPS
ORAL_CAPSULE | ORAL | Status: AC
  Filled 2017-05-08: qty 1

## 2017-05-08 MED ORDER — SODIUM CHLORIDE 0.9 % IV SOLN
750.0000 mg | INTRAVENOUS | Status: DC
  Administered 2017-05-08: 750 mg via INTRAVENOUS
  Filled 2017-05-08: qty 30

## 2017-05-08 MED ORDER — ACETAMINOPHEN 325 MG PO TABS: 650.0000 mg | ORAL_TABLET | ORAL | Status: DC

## 2017-05-08 MED ORDER — DIPHENHYDRAMINE HCL 25 MG PO CAPS
25.0000 mg | ORAL_CAPSULE | ORAL | Status: DC
  Administered 2017-05-08: 25 mg via ORAL

## 2017-05-11 ENCOUNTER — Encounter (HOSPITAL_COMMUNITY): Payer: Medicare Other

## 2017-05-25 ENCOUNTER — Other Ambulatory Visit: Payer: Self-pay | Admitting: *Deleted

## 2017-05-25 DIAGNOSIS — M0579 Rheumatoid arthritis with rheumatoid factor of multiple sites without organ or systems involvement: Secondary | ICD-10-CM

## 2017-05-26 ENCOUNTER — Ambulatory Visit: Payer: Medicare Other | Admitting: Sports Medicine

## 2017-06-02 ENCOUNTER — Telehealth: Payer: Self-pay | Admitting: Sports Medicine

## 2017-06-02 NOTE — Telephone Encounter (Signed)
Diabetic shoes/inserts in...lvm for pt to call to schedule an appt to pick them up for this week because authorization expires 6.4.19.  If she does not get them before auth expires we will have to restart paperwork.

## 2017-06-04 ENCOUNTER — Other Ambulatory Visit (HOSPITAL_COMMUNITY): Payer: Self-pay | Admitting: *Deleted

## 2017-06-05 ENCOUNTER — Ambulatory Visit (HOSPITAL_COMMUNITY): Admission: RE | Admit: 2017-06-05 | Payer: Medicare Other | Source: Ambulatory Visit

## 2017-06-08 ENCOUNTER — Encounter (HOSPITAL_COMMUNITY)
Admission: RE | Admit: 2017-06-08 | Discharge: 2017-06-08 | Disposition: A | Discharge status: Home / Self Care | Payer: Medicare Other | Source: Ambulatory Visit | Attending: Rheumatology | Admitting: Rheumatology

## 2017-06-08 DIAGNOSIS — M069 Rheumatoid arthritis, unspecified: Secondary | ICD-10-CM | POA: Diagnosis not present

## 2017-06-08 DIAGNOSIS — M0579 Rheumatoid arthritis with rheumatoid factor of multiple sites without organ or systems involvement: Secondary | ICD-10-CM

## 2017-06-08 LAB — CBC
HCT: 30.8 % — ABNORMAL LOW (ref 36.0–46.0)
Hemoglobin: 9.9 g/dL — ABNORMAL LOW (ref 12.0–15.0)
MCH: 27.7 pg (ref 26.0–34.0)
MCHC: 32.1 g/dL (ref 30.0–36.0)
MCV: 86 fL (ref 78.0–100.0)
PLATELETS: 313 10*3/uL (ref 150–400)
RBC: 3.58 MIL/uL — ABNORMAL LOW (ref 3.87–5.11)
RDW: 14.9 % (ref 11.5–15.5)
WBC: 7.1 10*3/uL (ref 4.0–10.5)

## 2017-06-08 LAB — COMPREHENSIVE METABOLIC PANEL
ALBUMIN: 3.5 g/dL (ref 3.5–5.0)
ALT: 12 U/L — ABNORMAL LOW (ref 14–54)
ANION GAP: 8 (ref 5–15)
AST: 18 U/L (ref 15–41)
Alkaline Phosphatase: 74 U/L (ref 38–126)
BUN: 14 mg/dL (ref 6–20)
CHLORIDE: 106 mmol/L (ref 101–111)
CO2: 26 mmol/L (ref 22–32)
Calcium: 9.4 mg/dL (ref 8.9–10.3)
Creatinine, Ser: 1.38 mg/dL — ABNORMAL HIGH (ref 0.44–1.00)
GFR calc Af Amer: 41 mL/min — ABNORMAL LOW (ref >60)
GFR calc non Af Amer: 36 mL/min — ABNORMAL LOW (ref >60)
GLUCOSE: 176 mg/dL — AB (ref 65–99)
POTASSIUM: 4.1 mmol/L (ref 3.5–5.1)
SODIUM: 140 mmol/L (ref 135–145)
Total Bilirubin: 0.8 mg/dL (ref 0.3–1.2)
Total Protein: 6.9 g/dL (ref 6.5–8.1)

## 2017-06-08 MED ORDER — DIPHENHYDRAMINE HCL 25 MG PO CAPS
25.0000 mg | ORAL_CAPSULE | ORAL | Status: DC
  Administered 2017-06-08: 25 mg via ORAL

## 2017-06-08 MED ORDER — ACETAMINOPHEN 325 MG PO TABS: 650.0000 mg | ORAL_TABLET | ORAL | Status: DC

## 2017-06-08 MED ORDER — SODIUM CHLORIDE 0.9 % IV SOLN
750.0000 mg | INTRAVENOUS | Status: DC
  Administered 2017-06-08: 750 mg via INTRAVENOUS
  Filled 2017-06-08: qty 30

## 2017-06-08 MED ORDER — DIPHENHYDRAMINE HCL 25 MG PO CAPS
ORAL_CAPSULE | ORAL | Status: AC
  Filled 2017-06-08: qty 1

## 2017-06-08 NOTE — Progress Notes (Signed)
And is anemic.  His forward labs to her PCP.  No change in therapy.

## 2017-06-09 NOTE — Progress Notes (Signed)
Office Visit Note  Patient: [name]             Date of Birth: 1940/08/28           MRN: [id number]             PCP: Delrae Rend, MD Referring: Jonathon Jordan, MD Visit Date: 06/23/2017 Occupation: @GUAROCC @    Subjective:  Joint pain and swelling.   History of Present Illness: PETRITA BLUNCK is a 77 y.o. female with history of seronegative rheumatoid arthritis.  She states she has been having pain and swelling in her hands prior to next Orencia infusion.  She states the river is quite expensive and she could not afford to get it on regular basis.  She discontinued Arava.  Not had any gout flare in a long time.  She continues to have discomfort in her knee joint where she has severe osteoarthritis.  She is planning to schedule an appointment with her orthopedic surgeon.   Her left total knee is replaced which is doing well.  Activities of Daily Living:  Patient reports morning stiffness for 2 hours.   Patient Reports nocturnal pain.  Difficulty dressing/grooming: Denies Difficulty climbing stairs: Reports Difficulty getting out of chair: Reports Difficulty using hands for taps, buttons, cutlery, and/or writing: Reports   Review of Systems  Constitutional: Positive for fatigue. Negative for night sweats, weight gain and weight loss.  HENT: Positive for mouth dryness. Negative for mouth sores, trouble swallowing, trouble swallowing and nose dryness.   Eyes: Negative for pain, redness, visual disturbance and dryness.  Respiratory: Positive for shortness of breath. Negative for cough and difficulty breathing.   Cardiovascular: Negative for chest pain, palpitations, hypertension, irregular heartbeat and swelling in legs/feet.  Gastrointestinal: Negative for blood in stool, constipation and diarrhea.  Endocrine: Negative for increased urination.  Genitourinary: Negative for vaginal dryness.  Musculoskeletal: Positive for arthralgias, joint pain, joint swelling,  myalgias, morning stiffness and myalgias. Negative for muscle weakness and muscle tenderness.  Skin: Negative for color change, rash, hair loss, skin tightness, ulcers and sensitivity to sunlight.  Allergic/Immunologic: Negative for susceptible to infections.  Neurological: Negative for dizziness, memory loss, night sweats and weakness.  Hematological: Negative for swollen glands.  Psychiatric/Behavioral: Negative for depressed mood and sleep disturbance. The patient is not nervous/anxious.     PMFS History:  Patient Active Problem List   Diagnosis Date Noted  . Primary osteoarthritis of right knee 09/25/2016  . 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 Elevated d-dimer   . Encephalopathy   . Sinus bradycardia   . Acute encephalopathy 02/27/2016  . Seropositive rheumatoid arthritis of multiple sites ([place]) 01/17/2016  . Bilateral hand pain 01/17/2016  . Bilateral wrist pain 01/17/2016  . Pain in right elbow 01/17/2016  . Left shoulder pain 01/17/2016  . Encounter for therapeutic drug monitoring 11/02/2015  . Bronchiectasis without complication ([place]) 78/67/6720  . Chronic gout involving toe without tophus 10/30/2015  . High risk medication use 10/29/2015  . Gout 10/29/2015  . H/O total knee replacement, left 10/29/2015  . 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 Diabetes mellitus, type II, insulin dependent ([place]) 10/18/2015  . AKI (acute kidney injury) ([place]) 10/18/2015  . CKD (chronic kidney disease) stage 3, GFR 30-59 ml/min (HCC) 10/18/2015  . Nodule of left lung 10/18/2015  . Neuropathy 08/22/2014  . Hypothyroidism 08/22/2014  . Chronic pain 08/22/2014  . Chronic congestive heart failure with left ventricular diastolic  dysfunction ([place]) 08/08/2013  . Chest pain 08/05/2013  . Aortic stenosis 12/22/2012  . Hyperlipidemia 12/22/2012  . PVC (premature ventricular contraction) 12/22/2012  . HTN (hypertension) 12/22/2012  .  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 Open angle with borderline findings, high risk 12/15/2011  . Cataract, nuclear 05/25/2011    Past Medical History:  Diagnosis Date  . Allergic rhinitis   . Anemia   . Aortic stenosis 12/22/2012   Mild 3/14-2.35 m/s peak velocity  . Cataract 2018  . CHF (congestive heart failure) ([place])   . Chronic pain   . CKD (chronic kidney disease), stage III ([place])   . Diabetes mellitus   . Diverticulosis   . DVT (deep venous thrombosis) ([place])   . Glaucoma   . Hemorrhoids    internal  . HTN (hypertension)   . Hypercholesteremia   . Hyperlipidemia 12/22/2012  . Hypothyroidism   . Meningioma ([place]) 2008  . Osteoarthritis   . Peripheral neuropathy   . PPD positive    6 months ago  . PVC (premature ventricular contraction) 12/22/2012  . Rheumatoid arthritis(714.0)   . Sickle cell trait ([place])   . 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 Hypotension Mother   . CVA Mother   . Diabetes Mother   . Hypertension Father   . Kidney failure Father    Past Surgical History:  Procedure Laterality Date  . ABDOMINAL HYSTERECTOMY  1978   TAH,& BSO 1  . CATARACT EXTRACTION    . fistula repair  03/2010   partial colectomy  . intracranial surgery  11/2007   removal of meningioma  . JOINT REPLACEMENT  2001   left TKR  . LEFT HEART CATHETERIZATION WITH CORONARY ANGIOGRAM N/A 08/08/2013   Procedure: LEFT HEART CATHETERIZATION WITH CORONARY ANGIOGRAM;  Surgeon: Peter M Martinique, MD;  Location: [facility] CATH LAB;  Service: Cardiovascular;  Laterality: N/A;  . ROTATOR CUFF REPAIR  1999   BIlateral  . SIGMOIDECTOMY  2012  . TOE SURGERY Left 2018  . TONSILLECTOMY AND ADENOIDECTOMY    . TOTAL THYROIDECTOMY  1992  . wrist sx     right    Social History   Social History Narrative   Daycare Provider   Drinks about 1 cup of coffee a day      Objective: Vital Signs: BP  (!) 127/52 (BP Location: Left Arm, Patient Position: Sitting, Cuff Size: Large)   Pulse 62   Resp 14   Ht 5\' 4"  (1.626 m)   Wt 197 lb (89.4 kg)   LMP  (LMP Unknown)   BMI 33.81 kg/m    Physical Exam  Constitutional: She is oriented to person, place, and time. She appears well-developed and well-nourished.  HENT:  Head: Normocephalic and atraumatic.  Eyes: Conjunctivae and EOM are normal.  Neck: Normal range of motion.  Cardiovascular: Normal rate, regular rhythm, normal heart sounds and intact distal pulses.  Pulmonary/Chest: Effort normal and breath sounds normal.  Abdominal: Soft. Bowel sounds are normal.  Lymphadenopathy:    She has no cervical adenopathy.  Neurological: She is alert and oriented to person, place, and time.  Skin: Skin is warm and dry. Capillary refill takes less than 2 seconds.  Psychiatric: She has a normal mood and affect. Her behavior is normal.  Nursing note and vitals reviewed.    Musculoskeletal Exam: C-spine and thoracic spine good  range of motion.  She has discomfort range of motion of her lumbar spine which is limited.  Shoulder joints and elbow joints are good range of motion.  She has synovitis over MCP joints as described below.  Hip joints were in good range of motion.  She has limited range of motion with discomfort in her bilateral knee joints.  Her left total knee replacement  appears to be doing well.  CDAI Exam: CDAI Homunculus Exam:   Tenderness:  Right hand: 1st MCP and 2nd MCP Left hand: 2nd MCP  Swelling:  Right hand: 1st MCP and 2nd MCP Left hand: 2nd MCP  Joint Counts:  CDAI Tender Joint count: 3 CDAI Swollen Joint count: 3  Global Assessments:  Patient Global Assessment: 5 Provider Global Assessment: 5  CDAI Calculated Score: 16    Investigation: No additional findings.uric acid: 10/13/2016 5.6 CBC Latest Ref Rng & Units 06/08/2017 04/13/2017 03/09/2017  WBC 4.0 - 10.5 K/uL 7.1 7.2 7.6  Hemoglobin 12.0 - 15.0 g/dL 9.9(L)  10.4(L) 10.4(L)  Hematocrit 36.0 - 46.0 % 30.8(L) 32.4(L) 32.7(L)  Platelets 150 - 400 K/uL 313 303 272   CMP Latest Ref Rng & Units 06/08/2017 04/13/2017 03/09/2017  Glucose 65 - 99 mg/dL 176(H) 113(H) 121(H)  BUN 6 - 20 mg/dL 14 15 27(H)  Creatinine 0.44 - 1.00 mg/dL 1.38(H) 1.48(H) 1.56(H)  Sodium 135 - 145 mmol/L 140 140 143  Potassium 3.5 - 5.1 mmol/L 4.1 4.3 5.0  Chloride 101 - 111 mmol/L 106 106 108  CO2 22 - 32 mmol/L 26 22 25   Calcium 8.9 - 10.3 mg/dL 9.4 9.9 9.1  Total Protein 6.5 - 8.1 g/dL 6.9 7.1 7.0  Total Bilirubin 0.3 - 1.2 mg/dL 0.8 0.5 1.1  Alkaline Phos 38 - 126 U/L 74 80 111  AST 15 - 41 U/L 18 24 29   ALT 14 - 54 U/L 12(L) 13(L) 15    Imaging: Xr Hip Unilat W Or W/o Pelvis 2-3 Views Right  Result Date: 06/23/2017 Mild superior lateral joint space narrowing was noted.  Spurring was noted.  No SI joint changes were noted. Impression: These findings are consistent with mild osteoarthritis of the hip joint.  Xr Hand 2 View Left  Result Date: 06/23/2017 Juxta-articular osteopenia was noted.  First and second MCP narrowing was noted.  PIP and DIP narrowing was noted.  No intercarpal or radiocarpal joint space narrowing was noted.  No erosive changes were noted. Impression: These findings are consistent with osteo-arthritis and rheumatoid arthritis.  Xr Hand 2 View Right  Result Date: 06/23/2017 First second and third MCP joint narrowing was noted.  Juxta-articular osteopenia was noted.  PIP and DIP narrowing was noted.  No intercarpal radiocarpal joint space narrowing was noted.  No erosive changes were noted. Impression: These findings are consistent with rheumatoid arthritis and osteoarthritis overlap.   Speciality Comments: Orencia 750mg  IV  every 4 weeks TB gold has been positive in the past, patient has yearly chest xrays    Procedures:  No procedures performed Allergies: Methotrexate derivatives; Other; Penicillins; Remicade [infliximab]; and Zocor [simvastatin]     Assessment / Plan:     Visit Diagnoses: Rheumatoid arthritis of multiple sites with negative rheumatoid factor (HCC)-patient continues to have active disease with synovitis in her hands.  She is currently on IV Orencia.  She is supposed to take Arava 10 mg p.o. daily along with that.  She discontinued Arava due to the cost of the medication.  She states she would not  be able to afford any DMARDs along with IV Orencia.  She likes to take prednisone on PRN basis.  I have discouraged the use of long-term prednisone.  Pain in both hands -she has synovitis in her bilateral hands as described above.  Plan: XR Hand 2 View Right, XR Hand 2 View Left.  The x-rays were consistent with osteoarthritis and rheumatoid arthritis overlap with MCP narrowing.  No erosive changes were noted.  Pain in right hip -she has been having discomfort in her right hip joint with range of motion.  Plan: XR HIP UNILAT W OR W/O PELVIS 2-3 VIEWS RIGHT osteoarthritic change was noted in the right hip joint.  High risk medication use - Orencia IV,  Arava 10 mg po qd (methotrexate caused elevated creatinine and LFTs,Remicade,PLQ-inadequate response) she has low GFR.  Her labs have been stable.  She is a still anemic.  I have advised her to follow-up with PCP regarding anemia.  Idiopathic chronic gout of multiple sites without tophus - allopurinol.uric acid: 10/13/2016 5.6.  She has not had any gout flares.  Primary osteoarthritis of right knee-she plans to see Dr. Noemi Chapel for total knee replacement in future.  H/O total knee replacement, left-doing well.  Positive QuantiFERON-TB Gold test/ patient has yearly chest xrays  - patient has yearly chest xrays.  She had CT chest in July 2018 by pulmonologist.   DDD (degenerative disc disease), lumbar-she had limited range of motion with discomfort.  History of diabetes mellitus  CKD (chronic kidney disease) stage 3, GFR 30-59 ml/min (HCC)  Chronic congestive heart failure with left  ventricular diastolic dysfunction (HCC)  Nodule of left lung -I reviewed her last CT scan which showed bilateral pulmonary nodulosis which was a stable from her previous CT scan.  Per radiologist recommendations she should have repeat CT scan.  Patient has not seen Dr. Chase Caller since then.  I have advised her to schedule a follow-up appointment and she may consider getting CT scan per his recommendations.  History of hyperlipidemia  History of hypertension-her blood pressure is well controlled.   Orders: Orders Placed This Encounter  Procedures  . XR Hand 2 View Right  . XR Hand 2 View Left  . XR HIP UNILAT W OR W/O PELVIS 2-3 VIEWS RIGHT   No orders of the defined types were placed in this encounter.   Face-to-face time spent with patient was 30 minutes. >50% of time was spent in counseling and coordination of care.  Follow-Up Instructions: Return in about 5 months (around 11/23/2017) for Rheumatoid arthritis, Osteoarthritis.   Bo Merino, MD  Note - This record has been created using Editor, commissioning.  Chart creation errors have been sought, but may not always  have been located. Such creation errors do not reflect on  the standard of medical care.

## 2017-06-12 ENCOUNTER — Encounter: Payer: Self-pay | Admitting: Podiatry

## 2017-06-12 ENCOUNTER — Ambulatory Visit (INDEPENDENT_AMBULATORY_CARE_PROVIDER_SITE_OTHER): Payer: Medicare Other | Admitting: Podiatry

## 2017-06-12 DIAGNOSIS — E1140 Type 2 diabetes mellitus with diabetic neuropathy, unspecified: Secondary | ICD-10-CM

## 2017-06-12 DIAGNOSIS — B351 Tinea unguium: Secondary | ICD-10-CM

## 2017-06-12 DIAGNOSIS — M79675 Pain in left toe(s): Secondary | ICD-10-CM | POA: Diagnosis not present

## 2017-06-12 DIAGNOSIS — L84 Corns and callosities: Secondary | ICD-10-CM

## 2017-06-12 DIAGNOSIS — Z794 Long term (current) use of insulin: Secondary | ICD-10-CM

## 2017-06-12 DIAGNOSIS — M79674 Pain in right toe(s): Secondary | ICD-10-CM

## 2017-06-12 NOTE — Progress Notes (Addendum)
Complaint:  Visit Type: Patient returns to my office for continued preventative foot care services. Complaint: Patient states" my nails have grown long and thick and become painful to walk and wear shoes" Patient has been diagnosed with DM with no foot complications. The patient presents for preventative foot care services. No changes to ROS.  She says the fourth toenails are her most painful nails.  She says she had surgery from Dr. Cannon Kettle to straighten her fourth toe.  Podiatric Exam: Vascular: dorsalis pedis and posterior tibial pulses are palpable bilateral. Capillary return is immediate. Temperature gradient is WNL. Skin turgor WNL  Sensorium: Diminished  Semmes Weinstein monofilament test. Normal tactile sensation bilaterally. Nail Exam: Pt has thick disfigured discolored nails with subungual debris noted bilateral entire nail hallux through fifth toenails.  Her fourth toenail are extremely thick and painful to palpation. Ulcer Exam: There is no evidence of ulcer or pre-ulcerative changes or infection. Orthopedic Exam: Muscle tone and strength are WNL. No limitations in general ROM. No crepitus or effusions noted. Foot type and digits show no abnormalities. Bony prominences are unremarkable. Pes planus.  Hammer toes  B/l. Skin: No Porokeratosis. No infection or ulcers.  Thick painful distal clavi fourth toe right foot.  Callus right forefoot.  Diagnosis:  Onychomycosis, , Pain in right toe, pain in left toes,  Clavi  Callus  Treatment & Plan Procedures and Treatment: Consent by patient was obtained for treatment procedures.   Debridement of mycotic and hypertrophic toenails, 1 through 5 bilateral and clearing of subungual debris. No ulceration, no infection noted.  Debride clavi fourth toe right foot.  Debride plantar tyloma.  RTC  With Dr.  Cannon Kettle.  Patient would benefit from tenotomy fourth toe right foot. Patient has not received her diabetic shoes since her medical doctor has not signed her  paperwork. Return Visit-Office Procedure: Patient instructed to return to the office for a follow up visit 3 months for continued evaluation and treatment.    Gardiner Barefoot DPM

## 2017-06-18 DIAGNOSIS — Z794 Long term (current) use of insulin: Secondary | ICD-10-CM | POA: Diagnosis not present

## 2017-06-18 DIAGNOSIS — E1142 Type 2 diabetes mellitus with diabetic polyneuropathy: Secondary | ICD-10-CM | POA: Diagnosis not present

## 2017-06-18 DIAGNOSIS — E039 Hypothyroidism, unspecified: Secondary | ICD-10-CM | POA: Diagnosis not present

## 2017-06-23 ENCOUNTER — Ambulatory Visit (INDEPENDENT_AMBULATORY_CARE_PROVIDER_SITE_OTHER): Payer: Medicare Other | Admitting: Rheumatology

## 2017-06-23 ENCOUNTER — Ambulatory Visit (INDEPENDENT_AMBULATORY_CARE_PROVIDER_SITE_OTHER): Payer: Medicare Other

## 2017-06-23 ENCOUNTER — Encounter: Payer: Self-pay | Admitting: Rheumatology

## 2017-06-23 ENCOUNTER — Ambulatory Visit (INDEPENDENT_AMBULATORY_CARE_PROVIDER_SITE_OTHER): Payer: Self-pay

## 2017-06-23 VITALS — BP 127/52 | HR 62 | Resp 14 | Ht 64.0 in | Wt 197.0 lb

## 2017-06-23 DIAGNOSIS — Z8639 Personal history of other endocrine, nutritional and metabolic disease: Secondary | ICD-10-CM

## 2017-06-23 DIAGNOSIS — Z96652 Presence of left artificial knee joint: Secondary | ICD-10-CM

## 2017-06-23 DIAGNOSIS — M79641 Pain in right hand: Secondary | ICD-10-CM | POA: Diagnosis not present

## 2017-06-23 DIAGNOSIS — M0609 Rheumatoid arthritis without rheumatoid factor, multiple sites: Secondary | ICD-10-CM

## 2017-06-23 DIAGNOSIS — M1711 Unilateral primary osteoarthritis, right knee: Secondary | ICD-10-CM

## 2017-06-23 DIAGNOSIS — Z79899 Other long term (current) drug therapy: Secondary | ICD-10-CM | POA: Diagnosis not present

## 2017-06-23 DIAGNOSIS — R911 Solitary pulmonary nodule: Secondary | ICD-10-CM

## 2017-06-23 DIAGNOSIS — Z8679 Personal history of other diseases of the circulatory system: Secondary | ICD-10-CM

## 2017-06-23 DIAGNOSIS — M25551 Pain in right hip: Secondary | ICD-10-CM

## 2017-06-23 DIAGNOSIS — M5136 Other intervertebral disc degeneration, lumbar region: Secondary | ICD-10-CM

## 2017-06-23 DIAGNOSIS — M79642 Pain in left hand: Secondary | ICD-10-CM

## 2017-06-23 DIAGNOSIS — N183 Chronic kidney disease, stage 3 unspecified: Secondary | ICD-10-CM

## 2017-06-23 DIAGNOSIS — I5032 Chronic diastolic (congestive) heart failure: Secondary | ICD-10-CM

## 2017-06-23 DIAGNOSIS — M1A09X0 Idiopathic chronic gout, multiple sites, without tophus (tophi): Secondary | ICD-10-CM | POA: Diagnosis not present

## 2017-06-23 DIAGNOSIS — R7612 Nonspecific reaction to cell mediated immunity measurement of gamma interferon antigen response without active tuberculosis: Secondary | ICD-10-CM

## 2017-06-23 DIAGNOSIS — M51369 Other intervertebral disc degeneration, lumbar region without mention of lumbar back pain or lower extremity pain: Secondary | ICD-10-CM

## 2017-06-26 ENCOUNTER — Ambulatory Visit: Payer: Medicare Other | Admitting: Podiatry

## 2017-07-03 ENCOUNTER — Other Ambulatory Visit: Payer: Self-pay | Admitting: *Deleted

## 2017-07-03 ENCOUNTER — Encounter (HOSPITAL_COMMUNITY): Payer: Medicare Other

## 2017-07-03 NOTE — Progress Notes (Signed)
Infusion orders are current for patient CBC CMP Tylenol Benadryl appointments are up to date and follow up appointment  is scheduled TB gold not due yet.  

## 2017-07-06 ENCOUNTER — Encounter (HOSPITAL_COMMUNITY)
Admission: RE | Admit: 2017-07-06 | Discharge: 2017-07-06 | Disposition: A | Discharge status: Home / Self Care | Payer: Medicare Other | Source: Ambulatory Visit | Attending: Rheumatology | Admitting: Rheumatology

## 2017-07-06 DIAGNOSIS — M0579 Rheumatoid arthritis with rheumatoid factor of multiple sites without organ or systems involvement: Secondary | ICD-10-CM | POA: Diagnosis not present

## 2017-07-06 IMAGING — CT CT ANGIO CHEST
2 of 6 series · 19 of 36 positions shown · IV contrast (OMNIPAQUE)
Comparison: Radiograph dated 09/20/2014

CLINICAL DATA: 74-year-old female with shortness of breath

EXAM:
CT ANGIOGRAPHY CHEST WITH CONTRAST
TECHNIQUE: Multidetector CT imaging of the chest was performed using the
standard protocol during bolus administration of intravenous
contrast. Multiplanar CT image reconstructions and MIPs were
obtained to evaluate the vascular anatomy.
CONTRAST:  80mL OMNIPAQUE IOHEXOL 350 MG/ML SOLN

[Series 6: pe thins @ 1mm · axial · 0.68mm/px · z∈[-178,+63]mm · 18 of 269 slices shown]
[im 14/269  lung]
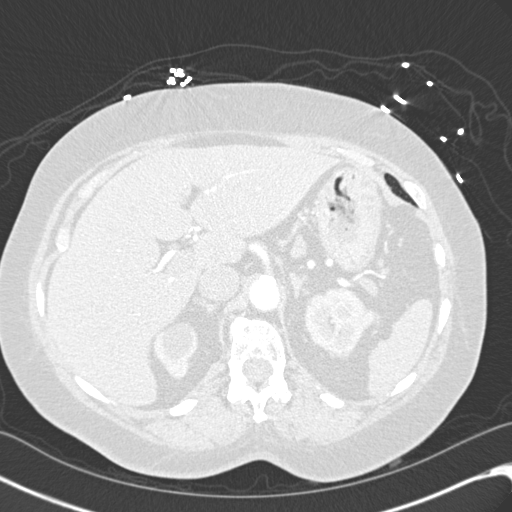
[im 27/269  mediastinal]
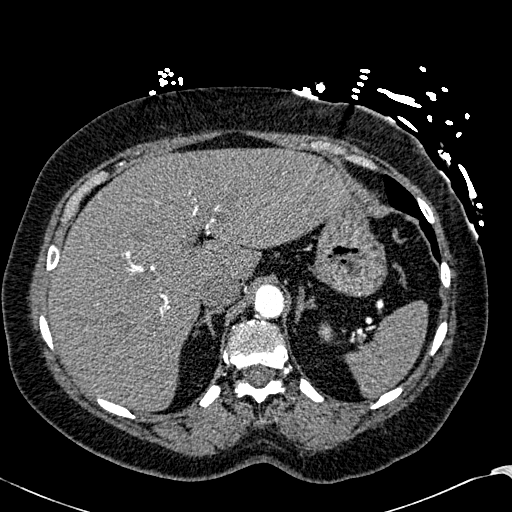
[im 41/269  lung]
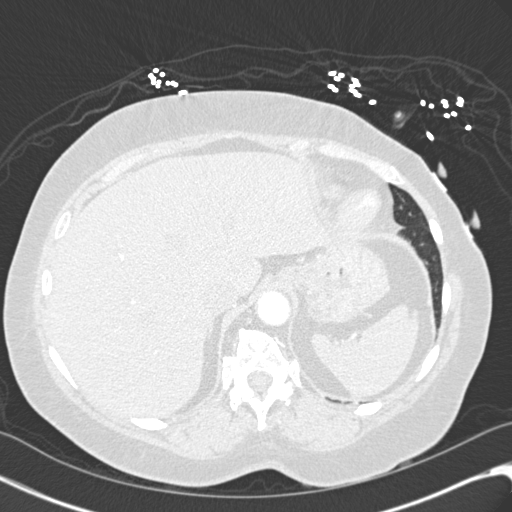
[im 54/269  mediastinal]
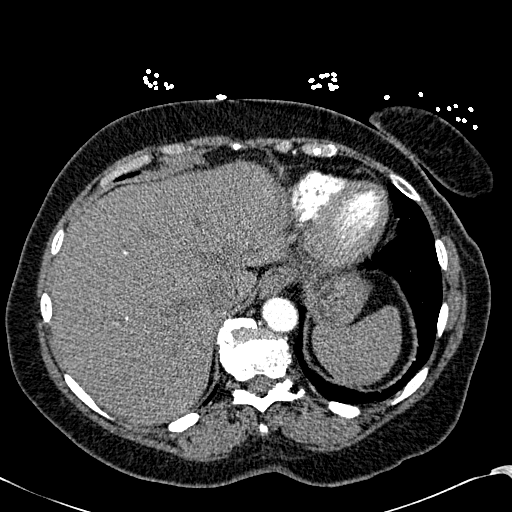
[im 68/269  lung]
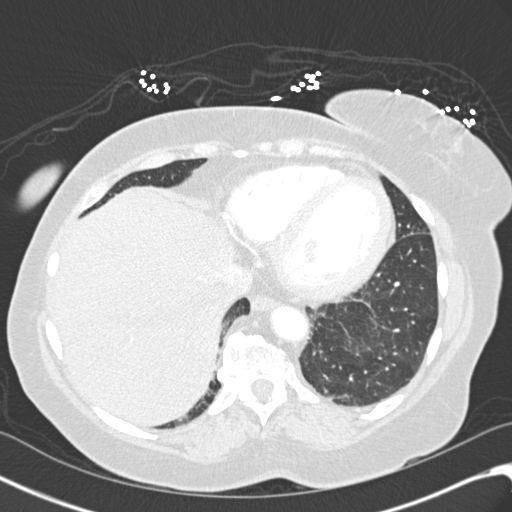
[im 81/269  mediastinal]
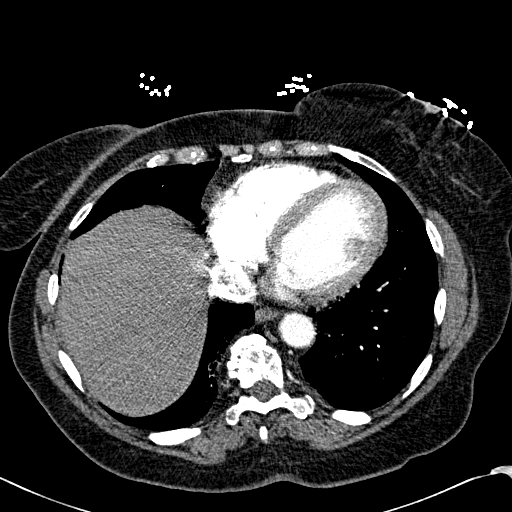
[im 94/269  lung]
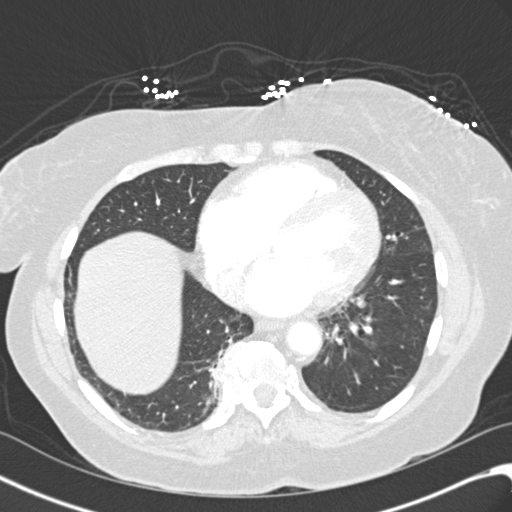
[im 108/269  mediastinal]
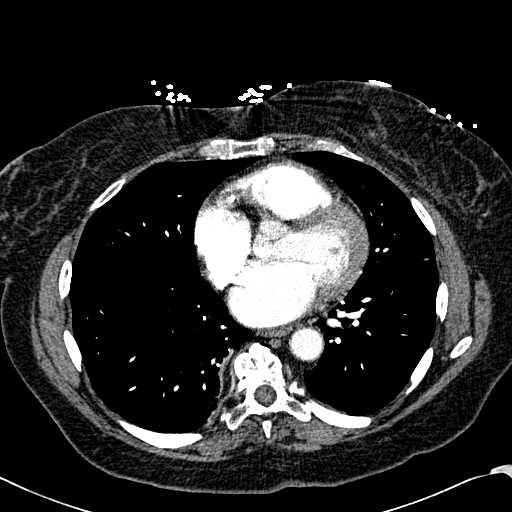
[im 121/269  lung]
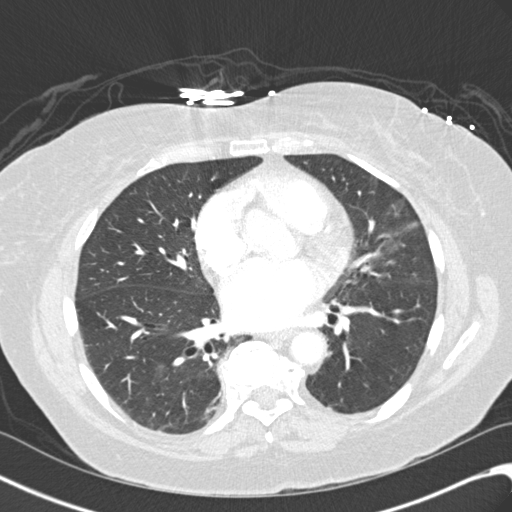
[im 148/269  mediastinal]
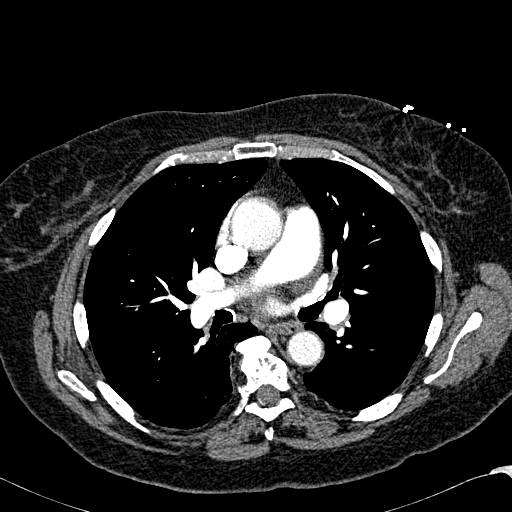
[im 161/269  lung]
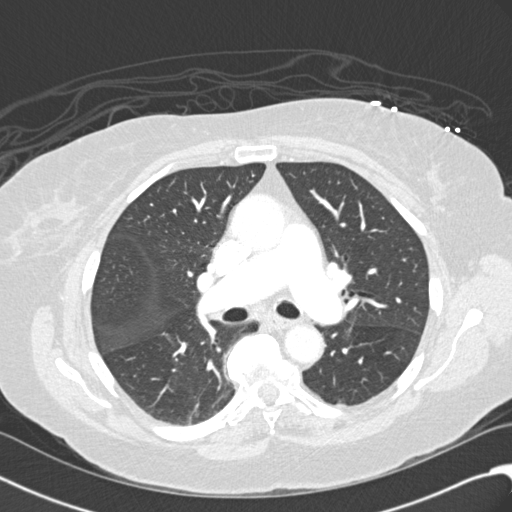
[im 175/269  mediastinal]
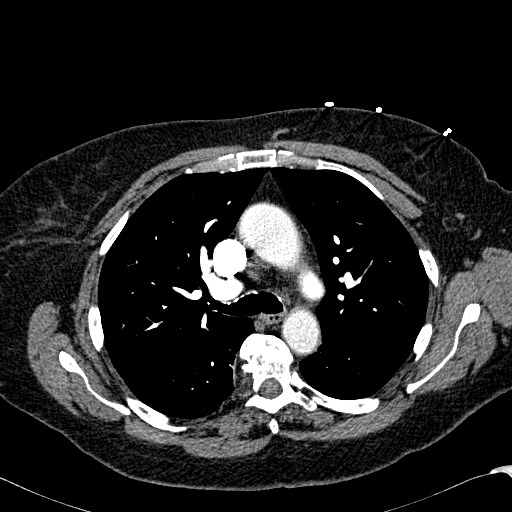
[im 188/269  lung]
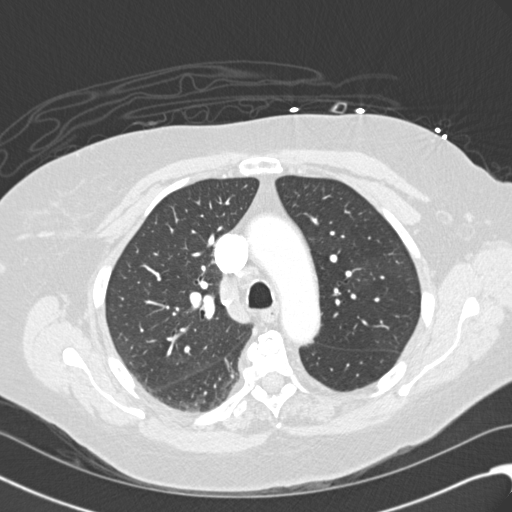
[im 202/269  mediastinal]
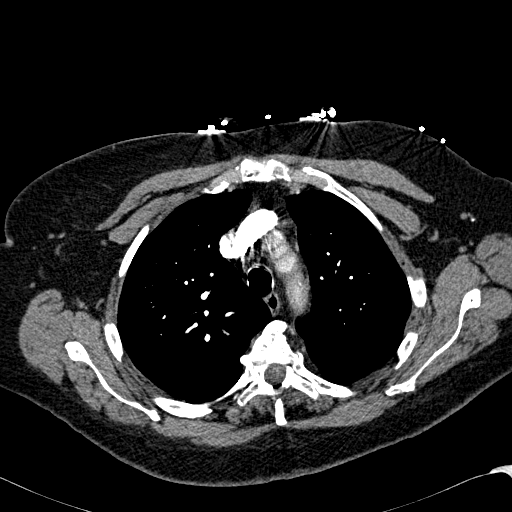
[im 215/269  lung]
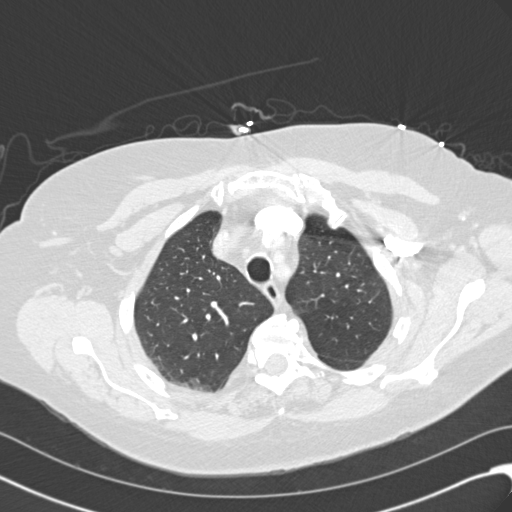
[im 228/269  mediastinal]
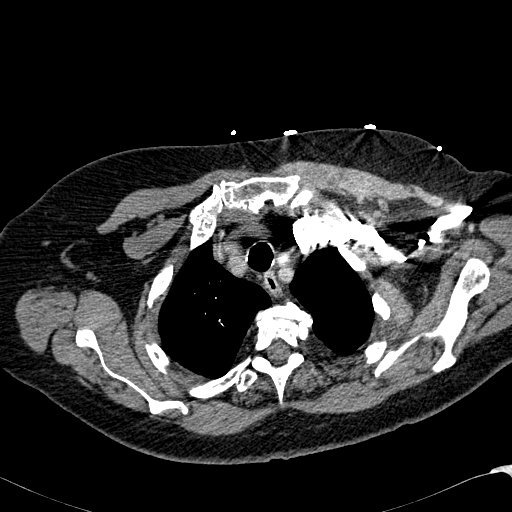
[im 242/269  lung]
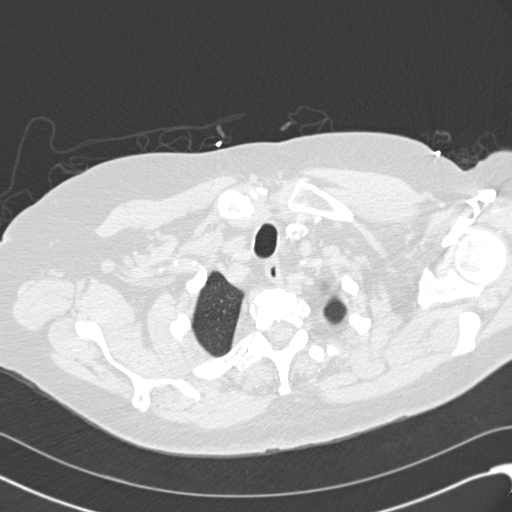
[im 255/269  mediastinal]
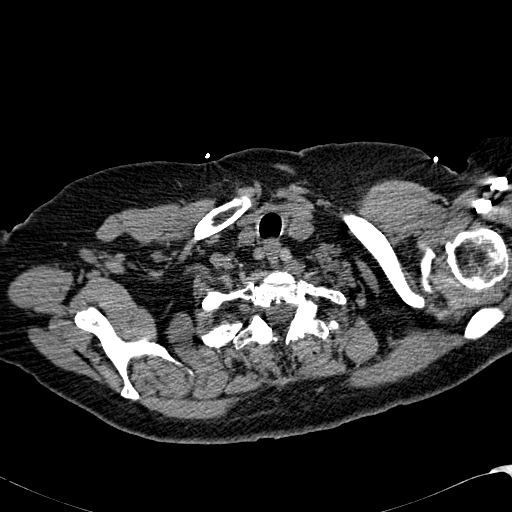

[Series 602: <mpr thick range> · coronal · 0.68mm/px · 1 of 92 slices shown]
[im 46/92  mediastinal]
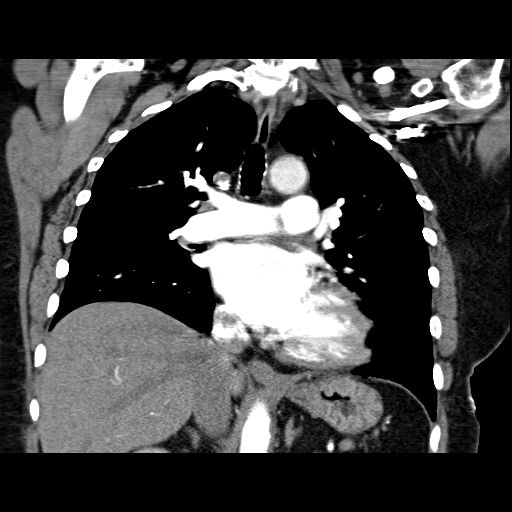

[19 of 36 positions shown; findings below may reference images not displayed]

FINDINGS: The lungs are clear. Linear atelectasis/scarring noted at the right
lung base medially. There is no pleural effusion or pneumothorax.
The central airways are patent.

The thoracic aorta appears unremarkable. No CT evidence of pulmonary
embolism. There is no cardiomegaly or pericardial effusion. There is
coronary vascular calcification. There is no hilar or mediastinal
adenopathy. The thyroid gland is not visualized. Esophagus is
grossly unremarkable.

There is no axillary adenopathy. The chest wall soft tissues appear
unremarkable. There is extensive degenerative changes of the spine
with multilevel anterior bridging osteophytes compatible with
diffuse idiopathic skeletal hyperostosis. No acute fracture.

Partially visualized left renal hypodense lesion, incompletely
characterized, possibly a cyst

Review of the MIP images confirms the above findings.
IMPRESSION: No CT evidence of pulmonary embolism.

## 2017-07-06 MED ORDER — DIPHENHYDRAMINE HCL 25 MG PO CAPS
25.0000 mg | ORAL_CAPSULE | ORAL | Status: DC
  Administered 2017-07-06: 25 mg via ORAL

## 2017-07-06 MED ORDER — ACETAMINOPHEN 325 MG PO TABS
ORAL_TABLET | ORAL | Status: AC
  Administered 2017-07-06: 650 mg via ORAL
  Filled 2017-07-06: qty 2

## 2017-07-06 MED ORDER — SODIUM CHLORIDE 0.9 % IV SOLN
750.0000 mg | INTRAVENOUS | Status: DC
  Administered 2017-07-06: 750 mg via INTRAVENOUS
  Filled 2017-07-06: qty 30

## 2017-07-06 MED ORDER — DIPHENHYDRAMINE HCL 25 MG PO CAPS
ORAL_CAPSULE | ORAL | Status: AC
  Administered 2017-07-06: 25 mg via ORAL
  Filled 2017-07-06: qty 1

## 2017-07-06 MED ORDER — ACETAMINOPHEN 325 MG PO TABS
650.0000 mg | ORAL_TABLET | ORAL | Status: DC
  Administered 2017-07-06: 650 mg via ORAL

## 2017-07-15 ENCOUNTER — Ambulatory Visit (INDEPENDENT_AMBULATORY_CARE_PROVIDER_SITE_OTHER): Payer: Self-pay | Admitting: Orthotics

## 2017-07-15 DIAGNOSIS — M2142 Flat foot [pes planus] (acquired), left foot: Secondary | ICD-10-CM

## 2017-07-15 DIAGNOSIS — E1140 Type 2 diabetes mellitus with diabetic neuropathy, unspecified: Secondary | ICD-10-CM

## 2017-07-15 DIAGNOSIS — M79674 Pain in right toe(s): Secondary | ICD-10-CM

## 2017-07-15 DIAGNOSIS — M2040 Other hammer toe(s) (acquired), unspecified foot: Secondary | ICD-10-CM | POA: Diagnosis not present

## 2017-07-15 DIAGNOSIS — M79675 Pain in left toe(s): Secondary | ICD-10-CM

## 2017-07-15 DIAGNOSIS — L84 Corns and callosities: Secondary | ICD-10-CM | POA: Diagnosis not present

## 2017-07-15 DIAGNOSIS — B351 Tinea unguium: Secondary | ICD-10-CM

## 2017-07-15 DIAGNOSIS — M2141 Flat foot [pes planus] (acquired), right foot: Secondary | ICD-10-CM | POA: Diagnosis not present

## 2017-07-15 DIAGNOSIS — Z794 Long term (current) use of insulin: Secondary | ICD-10-CM

## 2017-07-15 NOTE — Progress Notes (Signed)
Patient had appointment today for definitive and final diabetic shoe fitting and delivery.  Patient was seen by Betha, C.Ped, [facility].   The inserts fit well and accomplished full contact with the plantar surface of the foot bilateral; the shoes fit well and offered forefoot freedom, no noticible heel slippage, and good toe clearance w/ the insert in place.  Patient was advised to monitor of any skin irritation, breakdown.  Patient was satisfied with fit and function. 

## 2017-07-17 DIAGNOSIS — M5417 Radiculopathy, lumbosacral region: Secondary | ICD-10-CM | POA: Diagnosis not present

## 2017-07-17 DIAGNOSIS — M5137 Other intervertebral disc degeneration, lumbosacral region: Secondary | ICD-10-CM | POA: Diagnosis not present

## 2017-07-21 DIAGNOSIS — M1711 Unilateral primary osteoarthritis, right knee: Secondary | ICD-10-CM | POA: Diagnosis not present

## 2017-07-27 ENCOUNTER — Other Ambulatory Visit: Payer: Self-pay | Admitting: *Deleted

## 2017-07-27 NOTE — Progress Notes (Signed)
Infusion orders are current for patient CBC CMP Tylenol Benadryl appointments are up to date and follow up appointment  is scheduled TB gold not due yet.  

## 2017-08-03 ENCOUNTER — Ambulatory Visit (HOSPITAL_COMMUNITY)
Admission: RE | Admit: 2017-08-03 | Discharge: 2017-08-03 | Disposition: A | Discharge status: Home / Self Care | Payer: Medicare Other | Source: Ambulatory Visit | Attending: Rheumatology | Admitting: Rheumatology

## 2017-08-03 DIAGNOSIS — M0579 Rheumatoid arthritis with rheumatoid factor of multiple sites without organ or systems involvement: Secondary | ICD-10-CM | POA: Diagnosis not present

## 2017-08-03 MED ORDER — ACETAMINOPHEN 325 MG PO TABS: 650.0000 mg | ORAL_TABLET | ORAL | Status: DC

## 2017-08-03 MED ORDER — DIPHENHYDRAMINE HCL 25 MG PO CAPS
25.0000 mg | ORAL_CAPSULE | ORAL | Status: DC
  Administered 2017-08-03: 25 mg via ORAL

## 2017-08-03 MED ORDER — DIPHENHYDRAMINE HCL 25 MG PO CAPS
ORAL_CAPSULE | ORAL | Status: AC
  Administered 2017-08-03: 25 mg via ORAL
  Filled 2017-08-03: qty 1

## 2017-08-03 MED ORDER — SODIUM CHLORIDE 0.9 % IV SOLN
750.0000 mg | INTRAVENOUS | Status: DC
  Administered 2017-08-03: 750 mg via INTRAVENOUS
  Filled 2017-08-03: qty 30

## 2017-08-06 DIAGNOSIS — E039 Hypothyroidism, unspecified: Secondary | ICD-10-CM | POA: Diagnosis not present

## 2017-08-24 DIAGNOSIS — T148XXA Other injury of unspecified body region, initial encounter: Secondary | ICD-10-CM | POA: Diagnosis not present

## 2017-08-24 DIAGNOSIS — G894 Chronic pain syndrome: Secondary | ICD-10-CM | POA: Diagnosis not present

## 2017-08-27 ENCOUNTER — Other Ambulatory Visit: Payer: Self-pay | Admitting: *Deleted

## 2017-08-27 ENCOUNTER — Telehealth: Payer: Self-pay | Admitting: *Deleted

## 2017-08-27 DIAGNOSIS — Z9225 Personal history of immunosupression therapy: Secondary | ICD-10-CM

## 2017-08-27 NOTE — Progress Notes (Signed)
Infusion orders are current for patient CBC CMP Tylenol Benadryl appointments are up to date and follow up appointment  is scheduled. Patient is due for yearly chest x-ray.

## 2017-08-27 NOTE — Telephone Encounter (Signed)
Patient advised she is due for a chest x-ray. Patient will have it done 08/31/17.

## 2017-08-28 ENCOUNTER — Other Ambulatory Visit: Payer: Self-pay | Admitting: *Deleted

## 2017-08-31 ENCOUNTER — Inpatient Hospital Stay (HOSPITAL_COMMUNITY): Admission: RE | Admit: 2017-08-31 | Payer: Medicare Other | Source: Ambulatory Visit

## 2017-08-31 DIAGNOSIS — H40023 Open angle with borderline findings, high risk, bilateral: Secondary | ICD-10-CM | POA: Diagnosis not present

## 2017-08-31 DIAGNOSIS — Z961 Presence of intraocular lens: Secondary | ICD-10-CM | POA: Diagnosis not present

## 2017-09-01 ENCOUNTER — Other Ambulatory Visit: Payer: Self-pay | Admitting: *Deleted

## 2017-09-01 NOTE — Progress Notes (Signed)
Infusion orders are current for patient CBC CMP Tylenol Benadryl appointments are up to date and follow up appointment  is due for yearly chest x-ray. Order placed.

## 2017-09-02 ENCOUNTER — Ambulatory Visit (HOSPITAL_COMMUNITY)
Admission: RE | Admit: 2017-09-02 | Discharge: 2017-09-02 | Disposition: A | Discharge status: Home / Self Care | Payer: Medicare Other | Source: Ambulatory Visit | Attending: Rheumatology | Admitting: Rheumatology

## 2017-09-02 DIAGNOSIS — Z8611 Personal history of tuberculosis: Secondary | ICD-10-CM | POA: Diagnosis not present

## 2017-09-02 DIAGNOSIS — Z9225 Personal history of immunosupression therapy: Secondary | ICD-10-CM | POA: Insufficient documentation

## 2017-09-03 NOTE — Progress Notes (Signed)
WNL

## 2017-09-11 DIAGNOSIS — Z23 Encounter for immunization: Secondary | ICD-10-CM | POA: Diagnosis not present

## 2017-09-11 DIAGNOSIS — N183 Chronic kidney disease, stage 3 (moderate): Secondary | ICD-10-CM | POA: Diagnosis not present

## 2017-09-11 DIAGNOSIS — I519 Heart disease, unspecified: Secondary | ICD-10-CM | POA: Diagnosis not present

## 2017-09-11 DIAGNOSIS — I129 Hypertensive chronic kidney disease with stage 1 through stage 4 chronic kidney disease, or unspecified chronic kidney disease: Secondary | ICD-10-CM | POA: Diagnosis not present

## 2017-09-11 DIAGNOSIS — G894 Chronic pain syndrome: Secondary | ICD-10-CM | POA: Diagnosis not present

## 2017-09-11 DIAGNOSIS — R600 Localized edema: Secondary | ICD-10-CM | POA: Diagnosis not present

## 2017-09-15 ENCOUNTER — Ambulatory Visit: Payer: Medicare Other | Admitting: Sports Medicine

## 2017-09-28 ENCOUNTER — Encounter (HOSPITAL_COMMUNITY): Payer: Medicare Other

## 2017-10-06 ENCOUNTER — Ambulatory Visit (INDEPENDENT_AMBULATORY_CARE_PROVIDER_SITE_OTHER): Payer: Medicare Other | Admitting: Sports Medicine

## 2017-10-06 ENCOUNTER — Encounter: Payer: Self-pay | Admitting: Sports Medicine

## 2017-10-06 DIAGNOSIS — L84 Corns and callosities: Secondary | ICD-10-CM

## 2017-10-06 DIAGNOSIS — E1140 Type 2 diabetes mellitus with diabetic neuropathy, unspecified: Secondary | ICD-10-CM

## 2017-10-06 DIAGNOSIS — M79674 Pain in right toe(s): Secondary | ICD-10-CM

## 2017-10-06 DIAGNOSIS — M79675 Pain in left toe(s): Secondary | ICD-10-CM | POA: Diagnosis not present

## 2017-10-06 DIAGNOSIS — B351 Tinea unguium: Secondary | ICD-10-CM | POA: Diagnosis not present

## 2017-10-06 DIAGNOSIS — Z794 Long term (current) use of insulin: Secondary | ICD-10-CM

## 2017-10-06 DIAGNOSIS — M2040 Other hammer toe(s) (acquired), unspecified foot: Secondary | ICD-10-CM

## 2017-10-06 NOTE — Progress Notes (Signed)
Patient ID: [name], female   DOB: February 14, 1940, 77 y.o.   MRN: [id number]  Subjective: [name] is a 77 y.o. female patient with history of type 2 diabetes who returns to office today complaining of long, painful nails and callus while ambulating in shoes; unable to trim. Patient states that the glucose reading this morning was 108. Patient last A1c was 6.5.  Patient reports that her husband had a heart attack at the end of August and since her last visit her calluses has continued to build up and become very thick and painful especially at her fourth toes and states that her last trim.  Patient denies any other symptoms.   Patient Active Problem List   Diagnosis Date Noted  . Primary osteoarthritis of right knee 09/25/2016  . 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 Elevated d-dimer   . Encephalopathy   . Sinus bradycardia   . Acute encephalopathy 02/27/2016  . Seropositive rheumatoid arthritis of multiple sites ([place]) 01/17/2016  . Bilateral hand pain 01/17/2016  . Bilateral wrist pain 01/17/2016  . Pain in right elbow 01/17/2016  . Left shoulder pain 01/17/2016  . Encounter for therapeutic drug monitoring 11/02/2015  . Bronchiectasis without complication ([place]) 82/99/3716  . Chronic gout involving toe without tophus 10/30/2015  . High risk medication use 10/29/2015  . Gout 10/29/2015  . H/O total knee replacement, left 10/29/2015  . 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 Diabetes mellitus, type II, insulin dependent ([place]) 10/18/2015  . AKI (acute kidney injury) ([place]) 10/18/2015  . CKD (chronic kidney disease) stage 3, GFR 30-59 ml/min (HCC) 10/18/2015  . Nodule of left lung 10/18/2015  . Neuropathy 08/22/2014  . Hypothyroidism 08/22/2014  . Chronic pain 08/22/2014  . Chronic congestive heart failure with left ventricular diastolic dysfunction ([place]) 08/08/2013  . Chest pain 08/05/2013  . Aortic stenosis  12/22/2012  . Hyperlipidemia 12/22/2012  . PVC (premature ventricular contraction) 12/22/2012  . HTN (hypertension) 12/22/2012  . 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 Open angle with borderline findings, high risk 12/15/2011  . Cataract, nuclear 05/25/2011   Current Outpatient Medications on File Prior to Visit  Medication Sig Dispense Refill  . Abatacept (ORENCIA IV) Inject into the vein. Daughters are not sure of doses. Received in the short stay area once a month    . acetaminophen (TYLENOL) 500 MG tablet Take 1,000 mg by mouth every 6 (six) hours as needed for mild pain.    Marland Kitchen aspirin EC 81 MG EC tablet Take 1 tablet (81 mg total) by mouth daily. 30 tablet 0  . atorvastatin (LIPITOR) 80 MG tablet Take 80 mg by mouth daily at 6 PM.     . BD INSULIN SYRINGE ULTRAFINE 31G X 15/64" 0.3 ML MISC     . cholecalciferol (VITAMIN D) 1000 UNITS tablet Take 1,000 Units by mouth daily.     . clotrimazole-betamethasone (LOTRISONE) cream as needed.     . cyclobenzaprine (FLEXERIL) 10 MG tablet Take 10 mg by mouth at bedtime.     . ferrous sulfate (FEOSOL) 325 (65 FE) MG tablet Take 325-650 mg by mouth daily with breakfast.    . folic acid (FOLVITE) 1 MG tablet Take 1 mg by mouth daily.    Marland Kitchen HYDROcodone-acetaminophen (NORCO) 5-325 MG tablet Take 1 tablet by mouth every 6 (six) hours as needed for moderate pain. 21 tablet 0  . hydroxychloroquine (PLAQUENIL) 200 MG tablet Take 1 tablet (200 mg total)  by mouth daily. 30 tablet 2  . insulin NPH-regular Human (NOVOLIN 70/30) (70-30) 100 UNIT/ML injection Inject 6-8 Units into the skin 3 (three) times daily with meals. Sliding source    . leflunomide (ARAVA) 10 MG tablet Take 1 tablet by mouth for 1 month if labs are stable increase to 2 tablets by mouth daily. 150 tablet 0  . levothyroxine (SYNTHROID, LEVOTHROID) 200 MCG tablet every morning.    Marland Kitchen losartan (COZAAR) 50 MG tablet Take 50 mg by mouth every evening.    . multivitamin (THERAGRAN) per tablet Take 1  tablet by mouth daily.     . pregabalin (LYRICA) 75 MG capsule 1 bid 28 capsule 0  . VITAMIN E PO Take 1 tablet by mouth daily.    Marland Kitchen allopurinol (ZYLOPRIM) 300 MG tablet Take 1 tablet (300 mg total) by mouth daily. 30 tablet 5   Current Facility-Administered Medications on File Prior to Visit  Medication Dose Route Frequency Provider Last Rate Last Dose  . triamcinolone acetonide (KENALOG) 10 MG/ML injection 10 mg  10 mg Other Once Landis Martins, DPM      . triamcinolone acetonide (KENALOG) 10 MG/ML injection 10 mg  10 mg Other Once Landis Martins, DPM       Allergies  Allergen Reactions  . Methotrexate Derivatives Other (See Comments)    Elevated creat. Levels  . Other Other (See Comments)    NO Blood products.  [name]- causes low blood sugar (pt states she was taking medication but cant remember what it was for.)  . Penicillins Rash    Has patient had a PCN reaction causing immediate rash, facial/tongue/throat swelling, SOB or lightheadedness with hypotension: Yes Has patient had a PCN reaction causing severe rash involving mucus membranes or skin necrosis: Yes  Has patient had a PCN reaction that required hospitalization No Has patient had a PCN reaction occurring within the last 10 years: No If all of the above answers are "NO", then may proceed with Cephalosporin use.   . Remicade [Infliximab] Other (See Comments)    Inadequate response   . Zocor [Simvastatin] Other (See Comments)    Leg cramps     Objective: General: Patient is awake, alert, and oriented x 3 and in no acute distress.  Integument: Skin is warm, dry and supple bilateral. Nails are tender, long, thickened and dystrophic with subungual debris, consistent with onychomycosis, 1-5 bilateral. No open lesions. + callus/ preulcerative lesions present at left 4th and  right 4th toe distal tuft with dry blood, with no signs of infection. Remaining integument unremarkable.  Vasculature:  Dorsalis Pedis pulse  1/4 bilateral. Posterior Tibial pulse  1/4 bilateral. Capillary fill time <3 sec 1-5 bilateral. Scant hair growth to the level of the digits.Temperature gradient within normal limits. No varicosities present bilateral. Trace edema present bilateral.   Neurology: The patient has diminished sensation measured with a 5.07/10g Semmes Weinstein Monofilament at all pedal sites bilateral . Vibratory sensation diminished bilateral with tuning fork. No Babinski sign present bilateral.   Musculoskeletal: +Bunion and hammertoe pedal deformities noted bilateral. Pes planus foot type. Muscular strength 5/5 in all lower extremity muscular groups bilateral. No tenderness with calf compression bilateral.   Assessment and Plan: Problem List Items Addressed This Visit    None    Visit Diagnoses    Pre-ulcerative calluses    -  Primary   Pain due to onychomycosis of toenails of both feet       Type 2 diabetes mellitus with diabetic  neuropathy, with long-term current use of insulin (HCC)       Hammer toe, unspecified laterality         -Examined patient. -Discussed and educated patient on diabetic foot care, especially with  regards to the vascular, neurological and musculoskeletal systems.  -Mechanically debrided all nails 1-5 bilateral using sterile nail nipper and filed with dremel without incident  -Debrided callus at right and left 4th toes using sterile chisel blade without incident. -Toe foam padding -Answered all patient questions -Patient to return as in 10-12 weeks for nail and callus care  -Patient advised to call the office if any problems or questions arise in the meantime.  Landis Martins, DPM

## 2017-10-13 DIAGNOSIS — M1711 Unilateral primary osteoarthritis, right knee: Secondary | ICD-10-CM | POA: Diagnosis not present

## 2017-10-13 DIAGNOSIS — M25561 Pain in right knee: Secondary | ICD-10-CM | POA: Diagnosis not present

## 2017-10-14 ENCOUNTER — Ambulatory Visit (HOSPITAL_COMMUNITY)
Admission: RE | Admit: 2017-10-14 | Discharge: 2017-10-14 | Disposition: A | Discharge status: Home / Self Care | Payer: Medicare Other | Source: Ambulatory Visit | Attending: Rheumatology | Admitting: Rheumatology

## 2017-10-14 ENCOUNTER — Other Ambulatory Visit: Payer: Self-pay | Admitting: *Deleted

## 2017-10-14 DIAGNOSIS — M0579 Rheumatoid arthritis with rheumatoid factor of multiple sites without organ or systems involvement: Secondary | ICD-10-CM | POA: Insufficient documentation

## 2017-10-14 LAB — COMPREHENSIVE METABOLIC PANEL
ALBUMIN: 3.5 g/dL (ref 3.5–5.0)
ALK PHOS: 85 U/L (ref 38–126)
ALT: 11 U/L (ref 0–44)
AST: 23 U/L (ref 15–41)
Anion gap: 7 (ref 5–15)
BILIRUBIN TOTAL: 0.7 mg/dL (ref 0.3–1.2)
BUN: 20 mg/dL (ref 8–23)
CALCIUM: 9.4 mg/dL (ref 8.9–10.3)
CO2: 25 mmol/L (ref 22–32)
Chloride: 106 mmol/L (ref 98–111)
Creatinine, Ser: 1.35 mg/dL — ABNORMAL HIGH (ref 0.44–1.00)
GFR calc Af Amer: 42 mL/min — ABNORMAL LOW (ref >60)
GFR calc non Af Amer: 37 mL/min — ABNORMAL LOW (ref >60)
GLUCOSE: 126 mg/dL — AB (ref 70–99)
Potassium: 4.8 mmol/L (ref 3.5–5.1)
Sodium: 138 mmol/L (ref 135–145)
TOTAL PROTEIN: 6.4 g/dL — AB (ref 6.5–8.1)

## 2017-10-14 LAB — CBC
HEMATOCRIT: 31.4 % — AB (ref 36.0–46.0)
HEMOGLOBIN: 9.8 g/dL — AB (ref 12.0–15.0)
MCH: 27.5 pg (ref 26.0–34.0)
MCHC: 31.2 g/dL (ref 30.0–36.0)
MCV: 88.2 fL (ref 80.0–100.0)
NRBC: 0 % (ref 0.0–0.2)
Platelets: 316 10*3/uL (ref 150–400)
RBC: 3.56 MIL/uL — AB (ref 3.87–5.11)
RDW: 16.4 % — ABNORMAL HIGH (ref 11.5–15.5)
WBC: 6.3 10*3/uL (ref 4.0–10.5)

## 2017-10-14 MED ORDER — ACETAMINOPHEN 325 MG PO TABS: 650.0000 mg | ORAL_TABLET | ORAL | Status: DC

## 2017-10-14 MED ORDER — DIPHENHYDRAMINE HCL 25 MG PO CAPS
ORAL_CAPSULE | ORAL | Status: AC
  Administered 2017-10-14: 25 mg via ORAL
  Filled 2017-10-14: qty 1

## 2017-10-14 MED ORDER — SODIUM CHLORIDE 0.9 % IV SOLN
750.0000 mg | INTRAVENOUS | Status: DC
  Administered 2017-10-14: 750 mg via INTRAVENOUS
  Filled 2017-10-14: qty 30

## 2017-10-14 MED ORDER — DIPHENHYDRAMINE HCL 25 MG PO CAPS
25.0000 mg | ORAL_CAPSULE | ORAL | Status: AC
  Administered 2017-10-14: 25 mg via ORAL

## 2017-10-14 NOTE — Progress Notes (Signed)
Infusion orders are current for patient CBC CMP Tylenol Benadryl appointments are up to date and follow up appointment  is scheduled. Patient has had yearly chest x-ray.

## 2017-10-14 NOTE — Progress Notes (Signed)
Please forward labs to her PCP.

## 2017-10-20 DIAGNOSIS — M25561 Pain in right knee: Secondary | ICD-10-CM | POA: Diagnosis not present

## 2017-10-20 DIAGNOSIS — M1711 Unilateral primary osteoarthritis, right knee: Secondary | ICD-10-CM | POA: Diagnosis not present

## 2017-10-26 ENCOUNTER — Other Ambulatory Visit: Payer: Self-pay | Admitting: *Deleted

## 2017-10-26 DIAGNOSIS — M0609 Rheumatoid arthritis without rheumatoid factor, multiple sites: Secondary | ICD-10-CM

## 2017-10-27 DIAGNOSIS — M25561 Pain in right knee: Secondary | ICD-10-CM | POA: Diagnosis not present

## 2017-10-27 DIAGNOSIS — M1711 Unilateral primary osteoarthritis, right knee: Secondary | ICD-10-CM | POA: Diagnosis not present

## 2017-11-03 ENCOUNTER — Ambulatory Visit: Payer: Medicare Other | Admitting: Sports Medicine

## 2017-11-03 DIAGNOSIS — M1711 Unilateral primary osteoarthritis, right knee: Secondary | ICD-10-CM | POA: Diagnosis not present

## 2017-11-03 DIAGNOSIS — M25561 Pain in right knee: Secondary | ICD-10-CM | POA: Diagnosis not present

## 2017-11-10 DIAGNOSIS — M25561 Pain in right knee: Secondary | ICD-10-CM | POA: Diagnosis not present

## 2017-11-10 DIAGNOSIS — M1711 Unilateral primary osteoarthritis, right knee: Secondary | ICD-10-CM | POA: Diagnosis not present

## 2017-11-11 ENCOUNTER — Encounter (HOSPITAL_COMMUNITY): Payer: Medicare Other

## 2017-11-13 NOTE — Progress Notes (Signed)
Office Visit Note  Patient: [name]             Date of Birth: 1940-03-25           MRN: [id number]             PCP: Delrae Rend, MD Referring: Delrae Rend, MD Visit Date: 11/24/2017 Occupation: @GUAROCC @  Subjective:  Pain in multiple joints     History of Present Illness: [name] is a 77 y.o. female with history of seronegative rheumatoid arthritis, gout, osteoarthritis, and DDD. She is on Orencia IV infusions every 28 days. She is on Allopurinol 300 mg 1 tablet by mouth daily. She is on Lyrica 75 mg 1 tablet BID.  She reports that she is having pain in multiple joints.  She states she is having pain in bilateral hands and is no swelling.  She states the pain is most severe in her right hand.  She is also having discomfort in her right wrist and right elbow joint.  She states that her last Orencia infusion was delayed by 1 week due to feeling like she was coming down with a cold.  She continues to have chronic neck and lower back pain.  She has chronic right knee pain no swelling to the toxigenic clinic.  She is wearing a right knee brace and has had Visco gel injections.  She continues to have discomfort in the posterior aspect of the right knee joint.  She states her left knee replacement causes intermittent discomfort.  She takes hydrocodone for pain relief. She reports that 2 weeks ago she had a flare of gout in the left foot.  She denies missing any doses of allopurinol.  She states that she did not take colchicine or prednisone and a gout flare resolved on its own.  She states that she ate shrimp prior to the flare.   Activities of Daily Living:  Patient reports morning stiffness for  1 hour.   Patient Reports nocturnal pain.  Difficulty dressing/grooming: Denies Difficulty climbing stairs: Reports Difficulty getting out of chair: Reports Difficulty using hands for taps, buttons, cutlery, and/or writing: Reports  Review of Systems  Constitutional:  Positive for fatigue.  HENT: Negative for mouth sores, mouth dryness and nose dryness.   Eyes: Positive for dryness. Negative for pain and visual disturbance.  Respiratory: Negative for cough, hemoptysis, shortness of breath and difficulty breathing.   Cardiovascular: Positive for palpitations. Negative for chest pain, hypertension and swelling in legs/feet.  Gastrointestinal: Positive for constipation. Negative for blood in stool and diarrhea.  Endocrine: Negative for increased urination.  Genitourinary: Negative for painful urination.  Musculoskeletal: Positive for arthralgias, joint pain, joint swelling and morning stiffness. Negative for myalgias, muscle weakness, muscle tenderness and myalgias.  Skin: Negative for color change, pallor, rash, hair loss, nodules/bumps, skin tightness, ulcers and sensitivity to sunlight.  Allergic/Immunologic: Negative for susceptible to infections.  Neurological: Negative for dizziness, numbness, headaches and weakness.  Hematological: Negative for swollen glands.  Psychiatric/Behavioral: Positive for sleep disturbance. Negative for depressed mood. The patient is not nervous/anxious.     PMFS History:  Patient Active Problem List   Diagnosis Date Noted  . Primary osteoarthritis of right knee 09/25/2016  . 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 Elevated d-dimer   . Encephalopathy   . Sinus bradycardia   . Acute encephalopathy 02/27/2016  . Seropositive rheumatoid arthritis of multiple sites ([place]) 01/17/2016  . Bilateral hand pain 01/17/2016  . Bilateral wrist pain  01/17/2016  . Pain in right elbow 01/17/2016  . Left shoulder pain 01/17/2016  . Encounter for therapeutic drug monitoring 11/02/2015  . Bronchiectasis without complication ([place]) 10/62/6948  . Chronic gout involving toe without tophus 10/30/2015  . High risk medication use 10/29/2015  . Gout 10/29/2015  . H/O total knee replacement, left 10/29/2015  . Positive  QuantiFERON-TB Gold test/ patient has yearly chest xrays  10/29/2015  . Diabetes mellitus, type II, insulin dependent ([place]) 10/18/2015  . AKI (acute kidney injury) ([place]) 10/18/2015  . CKD (chronic kidney disease) stage 3, GFR 30-59 ml/min (HCC) 10/18/2015  . Nodule of left lung 10/18/2015  . Neuropathy 08/22/2014  . Hypothyroidism 08/22/2014  . Chronic pain 08/22/2014  . Chronic congestive heart failure with left ventricular diastolic dysfunction ([place]) 08/08/2013  . Chest pain 08/05/2013  . Aortic stenosis 12/22/2012  . Hyperlipidemia 12/22/2012  . PVC (premature ventricular contraction) 12/22/2012  . HTN (hypertension) 12/22/2012  . 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 Open angle with borderline findings, high risk 12/15/2011  . Cataract, nuclear 05/25/2011    Past Medical History:  Diagnosis Date  . Allergic rhinitis   . Anemia   . Aortic stenosis 12/22/2012   Mild 3/14-2.35 m/s peak velocity  . Cataract 2018  . CHF (congestive heart failure) ([place])   . Chronic pain   . CKD (chronic kidney disease), stage III ([place])   . Diabetes mellitus   . Diverticulosis   . DVT (deep venous thrombosis) ([place])   . Glaucoma   . Hemorrhoids    internal  . HTN (hypertension)   . Hypercholesteremia   . Hyperlipidemia 12/22/2012  . Hypothyroidism   . Meningioma ([place]) 2008  . Osteoarthritis   . Peripheral neuropathy   . PPD positive    6 months ago  . PVC (premature ventricular contraction) 12/22/2012  . Rheumatoid arthritis(714.0)   . Sickle cell trait ([place])   . 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 Hypotension Mother   . CVA Mother   . Diabetes Mother   . Hypertension Father   . Kidney failure Father    Past Surgical History:  Procedure Laterality Date  . ABDOMINAL HYSTERECTOMY  1978   TAH,& BSO 1  . CATARACT EXTRACTION    . fistula repair  03/2010   partial colectomy  .  intracranial surgery  11/2007   removal of meningioma  . JOINT REPLACEMENT  2001   left TKR  . LEFT HEART CATHETERIZATION WITH CORONARY ANGIOGRAM N/A 08/08/2013   Procedure: LEFT HEART CATHETERIZATION WITH CORONARY ANGIOGRAM;  Surgeon: Peter M Martinique, MD;  Location: [facility] CATH LAB;  Service: Cardiovascular;  Laterality: N/A;  . ROTATOR CUFF REPAIR  1999   BIlateral  . SIGMOIDECTOMY  2012  . TOE SURGERY Left 2018  . TONSILLECTOMY AND ADENOIDECTOMY    . TOTAL THYROIDECTOMY  1992  . wrist sx     right    Social History   Social History Narrative   Daycare Provider   Drinks about 1 cup of coffee a day     Objective: Vital Signs: BP (!) 144/76 (BP Location: Left Arm, Patient Position: Sitting, Cuff Size: Normal)   Pulse 79   Resp 15   Ht 5\' 4"  (1.626 m)   Wt 188 lb 12.8 oz (85.6 kg)   LMP  (LMP Unknown)   BMI 32.41 kg/m  Physical Exam  Constitutional: She is oriented to person, place, and time. She appears well-developed and well-nourished.  HENT:  Head: Normocephalic and atraumatic.  Eyes: Conjunctivae and EOM are normal.  Neck: Normal range of motion.  Cardiovascular: Normal rate, regular rhythm and intact distal pulses.  Murmur heard. Pulmonary/Chest: Effort normal and breath sounds normal.  Abdominal: Soft. Bowel sounds are normal.  Lymphadenopathy:    She has no cervical adenopathy.  Neurological: She is alert and oriented to person, place, and time.  Skin: Skin is warm and dry. Capillary refill takes less than 2 seconds.  Psychiatric: She has a normal mood and affect. Her behavior is normal.  Nursing note and vitals reviewed.    Musculoskeletal Exam: C-spine limited range of motion.  Thoracic and lumbar spine limited range of motion with discomfort.  Midline spinal tenderness in lumbar region.  Shoulder joints and elbow joints good range of motion.  She has tenderness of the right elbow joint on exam.  She has synovitis of MCPs and PIPs as described below.  Patient has  good range of motion with no discomfort.  She has limited range of motion of bilateral knee joints.  She has right knee warmth on exam.  She has tenderness in the popliteal region of the right knee joint.  She is wearing a right knee brace.  Pedal edema bilaterally.  CDAI Exam: CDAI Score: 13.4  Patient Global Assessment: 7 (mm); Provider Global Assessment: 7 (mm) Swollen: 5 ; Tender: 7  Joint Exam      Right  Left  Elbow   Tender     Wrist   Tender     MCP 1  Swollen Tender     MCP 2  Swollen Tender     MCP 3  Swollen Tender     PIP 3  Swollen Tender  Swollen Tender     Investigation: No additional findings.  Imaging: No results found.  Recent Labs: Lab Results  Component Value Date   WBC 7.6 11/16/2017   HGB 11.4 (L) 11/16/2017   PLT 358 11/16/2017   NA 142 11/16/2017   K 4.8 11/16/2017   CL 109 11/16/2017   CO2 26 11/16/2017   GLUCOSE 146 (H) 11/16/2017   BUN 15 11/16/2017   CREATININE 1.54 (H) 11/16/2017   BILITOT 1.6 (H) 11/16/2017   ALKPHOS 81 11/16/2017   AST 29 11/16/2017   ALT 9 11/16/2017   PROT 7.9 11/16/2017   ALBUMIN 4.1 11/16/2017   CALCIUM 9.9 11/16/2017   GFRAA 36 (L) 11/16/2017   QFTBGOLD Positive (A) 04/28/2014   Lab Results  Component Value Date   LABURIC 5.6 10/13/2016   Speciality Comments: Orencia 750mg  IV  every 4 weeks TB gold has been positive in the past, patient has yearly chest xrays  Procedures:  No procedures performed Allergies: Methotrexate derivatives; Other; Penicillins; Remicade [infliximab]; and Zocor [simvastatin]    Assessment / Plan:     Visit Diagnoses: Rheumatoid arthritis of multiple sites with negative rheumatoid factor ([place]): She continues to have active synovitis.  She is on Orencia IV infusions every 28 days.  She had a one-week delay prior to her last infusion due to feeling as if she was coming down with a cold.  She continues to have pain in multiple joints including the right elbow right wrist, bilateral  hands, and right knee joint.  She discontinued Arava in the past due to cost.  We discussed that Jolee Ewing would be a $15 a month but  she cannot afford it at this time due to the cost of her other medications.  She will continue on Orencia IV infusions every 28 days.  She does not want any additional medications at this time.  She will follow up in the office in 5 months.   High risk medication use - Orencia IV, (arava discontinued due to the cost, methotrexate caused elevated creatinine and LFTs,Remicade,PLQ-inadequate response).  Last chest xray WNL on 09/03/17 as history of TB.  Most recent CBC/CMP stable except  decrease in kidney function on 11/16/17.  Labs forwarded to PCP and instructed patient to avoid NSAIDs. Last x-rays on 07/01/16 to monitor joint erosion.    Idiopathic chronic gout of multiple sites without tophus -She had a gout flare 2 weeks ago in the left foot after eating shrimp.  She has been taking allopurinol 300 mg by mouth daily.  She has not missed any doses of allopurinol recently.  She states that the flare resolved on its own without taking colchicine or prednisone.  She tried hydrocodone for pain relief.  We will check uric acid level today.  She does not need any refills of allopurinol at this time.  We discussed avoiding purine rich foods.  She is advised to notify us if she continues to have recurrent gout flares.  uric acid: 10/13/2016 5.6 - Plan: Uric acid  Primary osteoarthritis of right knee -She has been going to the flexogenic clinic.  She had Visco gel injections which provided significant relief.  She is wearing a right knee brace but continues to have discomfort in the posterior aspect of the right knee.  She walks with a cane.  She has right knee warmth on exam.  She is trying to postpone a knee replacement at this time.  H/O total knee replacement, left: No warmth or effusion.  She has intermittent discomfort in the left knee replacement.  Positive QuantiFERON-TB Gold  test/ patient has yearly chest xrays  - Most recent chest x-ray 09/03/17. No active disease noted.   DDD (degenerative disc disease), lumbar: Chronic pain  Other medical conditions are listed as follows:   CKD (chronic kidney disease) stage 3, GFR 30-59 ml/min (HCC)  History of diabetes mellitus  History of hypertension  Nodule of left lung - Previous CT scan showed bilateral pulmonary nodulosis which was a stable from her previous CT scan.   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 Uric acid   No orders of the defined types were placed in this encounter.   Face-to-face time spent with patient was 30 minutes. Greater than 50% of time was spent in counseling and coordination of care.  Follow-Up Instructions: Return in about 5 months (around 04/25/2018) for Rheumatoid arthritis, Osteoarthritis, Gout, DDD.   Julia Neas, PA-C   I examined and evaluated the patient with Julia Sams PA.  Patient continues to have some synovitis on my exam.  She does not want to take any additional medications.  She is on IV Orencia which she is tolerating well.  I detailed discussion regarding adding DMARDs but she declined due to the cost of medications.  The plan of care was discussed as noted above.  Bo Merino, MD  Note - This record has been created using Editor, commissioning.  Chart creation errors have been sought, but may not always  have been located. Such creation errors do not reflect on  the standard of medical care. 

## 2017-11-16 ENCOUNTER — Ambulatory Visit (HOSPITAL_COMMUNITY)
Admission: RE | Admit: 2017-11-16 | Discharge: 2017-11-16 | Disposition: A | Discharge status: Home / Self Care | Payer: Medicare Other | Source: Ambulatory Visit | Attending: Rheumatology | Admitting: Rheumatology

## 2017-11-16 DIAGNOSIS — M0609 Rheumatoid arthritis without rheumatoid factor, multiple sites: Secondary | ICD-10-CM | POA: Insufficient documentation

## 2017-11-16 LAB — COMPREHENSIVE METABOLIC PANEL
ALT: 9 U/L (ref 0–44)
AST: 29 U/L (ref 15–41)
Albumin: 4.1 g/dL (ref 3.5–5.0)
Alkaline Phosphatase: 81 U/L (ref 38–126)
Anion gap: 7 (ref 5–15)
BILIRUBIN TOTAL: 1.6 mg/dL — AB (ref 0.3–1.2)
BUN: 15 mg/dL (ref 8–23)
CHLORIDE: 109 mmol/L (ref 98–111)
CO2: 26 mmol/L (ref 22–32)
Calcium: 9.9 mg/dL (ref 8.9–10.3)
Creatinine, Ser: 1.54 mg/dL — ABNORMAL HIGH (ref 0.44–1.00)
GFR, EST AFRICAN AMERICAN: 36 mL/min — AB (ref >60)
GFR, EST NON AFRICAN AMERICAN: 31 mL/min — AB (ref >60)
Glucose, Bld: 146 mg/dL — ABNORMAL HIGH (ref 70–99)
Potassium: 4.8 mmol/L (ref 3.5–5.1)
SODIUM: 142 mmol/L (ref 135–145)
TOTAL PROTEIN: 7.9 g/dL (ref 6.5–8.1)

## 2017-11-16 LAB — CBC
HCT: 37.7 % (ref 36.0–46.0)
Hemoglobin: 11.4 g/dL — ABNORMAL LOW (ref 12.0–15.0)
MCH: 27.1 pg (ref 26.0–34.0)
MCHC: 30.2 g/dL (ref 30.0–36.0)
MCV: 89.8 fL (ref 80.0–100.0)
NRBC: 0 % (ref 0.0–0.2)
PLATELETS: 358 10*3/uL (ref 150–400)
RBC: 4.2 MIL/uL (ref 3.87–5.11)
RDW: 16.6 % — ABNORMAL HIGH (ref 11.5–15.5)
WBC: 7.6 10*3/uL (ref 4.0–10.5)

## 2017-11-16 MED ORDER — SODIUM CHLORIDE 0.9 % IV SOLN
750.0000 mg | INTRAVENOUS | Status: DC
  Administered 2017-11-16: 750 mg via INTRAVENOUS
  Filled 2017-11-16: qty 30

## 2017-11-16 MED ORDER — DIPHENHYDRAMINE HCL 25 MG PO CAPS: 25.0000 mg | ORAL_CAPSULE | ORAL | Status: DC

## 2017-11-16 MED ORDER — ACETAMINOPHEN 325 MG PO TABS: 650.0000 mg | ORAL_TABLET | ORAL | Status: DC

## 2017-11-16 NOTE — [place] Notes (Signed)
Please, forward labs to her PCP. She is only on IV Orencia. Advise her to avoid all NSAIDS.

## 2017-11-24 ENCOUNTER — Ambulatory Visit (INDEPENDENT_AMBULATORY_CARE_PROVIDER_SITE_OTHER): Payer: Medicare Other | Admitting: Rheumatology

## 2017-11-24 ENCOUNTER — Encounter: Payer: Self-pay | Admitting: Rheumatology

## 2017-11-24 VITALS — BP 144/76 | HR 79 | Resp 15 | Ht 64.0 in | Wt 188.8 lb

## 2017-11-24 DIAGNOSIS — R911 Solitary pulmonary nodule: Secondary | ICD-10-CM

## 2017-11-24 DIAGNOSIS — N183 Chronic kidney disease, stage 3 unspecified: Secondary | ICD-10-CM

## 2017-11-24 DIAGNOSIS — R7612 Nonspecific reaction to cell mediated immunity measurement of gamma interferon antigen response without active tuberculosis: Secondary | ICD-10-CM | POA: Diagnosis not present

## 2017-11-24 DIAGNOSIS — Z8639 Personal history of other endocrine, nutritional and metabolic disease: Secondary | ICD-10-CM | POA: Diagnosis not present

## 2017-11-24 DIAGNOSIS — M0609 Rheumatoid arthritis without rheumatoid factor, multiple sites: Secondary | ICD-10-CM | POA: Diagnosis not present

## 2017-11-24 DIAGNOSIS — M1711 Unilateral primary osteoarthritis, right knee: Secondary | ICD-10-CM | POA: Diagnosis not present

## 2017-11-24 DIAGNOSIS — M51369 Other intervertebral disc degeneration, lumbar region without mention of lumbar back pain or lower extremity pain: Secondary | ICD-10-CM

## 2017-11-24 DIAGNOSIS — Z96652 Presence of left artificial knee joint: Secondary | ICD-10-CM

## 2017-11-24 DIAGNOSIS — M1A09X0 Idiopathic chronic gout, multiple sites, without tophus (tophi): Secondary | ICD-10-CM | POA: Diagnosis not present

## 2017-11-24 DIAGNOSIS — Z8679 Personal history of other diseases of the circulatory system: Secondary | ICD-10-CM | POA: Diagnosis not present

## 2017-11-24 DIAGNOSIS — Z79899 Other long term (current) drug therapy: Secondary | ICD-10-CM | POA: Diagnosis not present

## 2017-11-24 DIAGNOSIS — M5136 Other intervertebral disc degeneration, lumbar region: Secondary | ICD-10-CM

## 2017-11-24 DIAGNOSIS — I5032 Chronic diastolic (congestive) heart failure: Secondary | ICD-10-CM

## 2017-11-24 NOTE — Progress Notes (Signed)
Medication Samples have been provided to the patient.  Drug name: Lyrica      Strength: 75mg       Qty: 4  LOT: [id number]  Exp.Date: 11/2018  Dosing instructions: Take 1 tablet by mouth twice daily.   The patient has been instructed regarding the correct time, dose, and frequency of taking this medication, including desired effects and most common side effects.   Earnestine Mealing 2:41 PM 11/24/2017

## 2017-11-25 LAB — URIC ACID: Uric Acid, Serum: 7.9 mg/dL — ABNORMAL HIGH (ref 2.5–7.0)

## 2017-11-25 NOTE — Progress Notes (Signed)
Uric acid is elevated-7.9.  She had a recent gout flare after eating shrimp.  She is on Allopurinol 300 mg 1 tablet by mouth daily.  Please stress the importance of compliance with taking allopurinol.  She should be avoid all purine rich foods. She needs to follow a strict diet to avoid triggers. I reviewed the labs with Dr. Estanislado Pandy. Creatinine is elevated and GFR is low, which may be contributing to rise of uric acid.  Please advise patient to notify us if she develops another flare.

## 2017-12-07 DIAGNOSIS — E1142 Type 2 diabetes mellitus with diabetic polyneuropathy: Secondary | ICD-10-CM | POA: Diagnosis not present

## 2017-12-07 DIAGNOSIS — E039 Hypothyroidism, unspecified: Secondary | ICD-10-CM | POA: Diagnosis not present

## 2017-12-07 DIAGNOSIS — Z794 Long term (current) use of insulin: Secondary | ICD-10-CM | POA: Diagnosis not present

## 2017-12-14 ENCOUNTER — Ambulatory Visit (HOSPITAL_COMMUNITY)
Admission: RE | Admit: 2017-12-14 | Discharge: 2017-12-14 | Disposition: A | Discharge status: Home / Self Care | Payer: Medicare Other | Source: Ambulatory Visit | Attending: Rheumatology | Admitting: Rheumatology

## 2017-12-14 DIAGNOSIS — M0609 Rheumatoid arthritis without rheumatoid factor, multiple sites: Secondary | ICD-10-CM | POA: Diagnosis not present

## 2017-12-14 MED ORDER — ACETAMINOPHEN 325 MG PO TABS
650.0000 mg | ORAL_TABLET | ORAL | Status: DC
  Administered 2017-12-14: 650 mg via ORAL

## 2017-12-14 MED ORDER — DIPHENHYDRAMINE HCL 25 MG PO CAPS
ORAL_CAPSULE | ORAL | Status: AC
  Filled 2017-12-14: qty 1

## 2017-12-14 MED ORDER — DIPHENHYDRAMINE HCL 25 MG PO CAPS
25.0000 mg | ORAL_CAPSULE | ORAL | Status: DC
  Administered 2017-12-14: 25 mg via ORAL

## 2017-12-14 MED ORDER — SODIUM CHLORIDE 0.9 % IV SOLN
750.0000 mg | INTRAVENOUS | Status: DC
  Administered 2017-12-14: 750 mg via INTRAVENOUS
  Filled 2017-12-14: qty 30

## 2017-12-14 MED ORDER — ACETAMINOPHEN 325 MG PO TABS
ORAL_TABLET | ORAL | Status: AC
  Filled 2017-12-14: qty 2

## 2017-12-16 ENCOUNTER — Telehealth: Payer: Self-pay | Admitting: Pharmacy Technician

## 2017-12-16 NOTE — Telephone Encounter (Signed)
Called the patient to verify benefits for 2020. Pt currently receives infusions that may require a pre-certification. Patient states that her insurance will remain the same for 2020 [employer] and [employer]). Neither require a precertification.

## 2017-12-22 ENCOUNTER — Other Ambulatory Visit: Payer: Self-pay | Admitting: Sports Medicine

## 2017-12-22 ENCOUNTER — Encounter: Payer: Self-pay | Admitting: Sports Medicine

## 2017-12-22 ENCOUNTER — Ambulatory Visit (INDEPENDENT_AMBULATORY_CARE_PROVIDER_SITE_OTHER): Payer: Medicare Other

## 2017-12-22 ENCOUNTER — Ambulatory Visit (INDEPENDENT_AMBULATORY_CARE_PROVIDER_SITE_OTHER): Payer: Medicare Other | Admitting: Sports Medicine

## 2017-12-22 ENCOUNTER — Other Ambulatory Visit: Payer: Self-pay | Admitting: Podiatry

## 2017-12-22 DIAGNOSIS — M7751 Other enthesopathy of right foot: Secondary | ICD-10-CM

## 2017-12-22 DIAGNOSIS — M7750 Other enthesopathy of unspecified foot: Secondary | ICD-10-CM

## 2017-12-22 DIAGNOSIS — E1140 Type 2 diabetes mellitus with diabetic neuropathy, unspecified: Secondary | ICD-10-CM | POA: Diagnosis not present

## 2017-12-22 DIAGNOSIS — M79671 Pain in right foot: Secondary | ICD-10-CM

## 2017-12-22 DIAGNOSIS — Z794 Long term (current) use of insulin: Secondary | ICD-10-CM | POA: Diagnosis not present

## 2017-12-22 DIAGNOSIS — M25571 Pain in right ankle and joints of right foot: Secondary | ICD-10-CM | POA: Diagnosis not present

## 2017-12-22 NOTE — Progress Notes (Signed)
Subjective:  [name] is a 77 y.o. female patient who presents to office for evaluation of right ankle pain. Patient complains of continued pain in the ankle for 3 months. Patient has tried no treatments. Patient denies any other pedal complaints. Denies recent injury/trip/fall/sprain/any causative factors.   Review of Systems  Musculoskeletal: Positive for joint pain.  All other systems reviewed and are negative.    Patient Active Problem List   Diagnosis Date Noted  . Primary osteoarthritis of right knee 09/25/2016  . 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 Elevated d-dimer   . Encephalopathy   . Sinus bradycardia   . Acute encephalopathy 02/27/2016  . Seropositive rheumatoid arthritis of multiple sites ([place]) 01/17/2016  . Bilateral hand pain 01/17/2016  . Bilateral wrist pain 01/17/2016  . Pain in right elbow 01/17/2016  . Left shoulder pain 01/17/2016  . Encounter for therapeutic drug monitoring 11/02/2015  . Bronchiectasis without complication ([place]) 80/32/1224  . Chronic gout involving toe without tophus 10/30/2015  . High risk medication use 10/29/2015  . Gout 10/29/2015  . H/O total knee replacement, left 10/29/2015  . 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 Diabetes mellitus, type II, insulin dependent ([place]) 10/18/2015  . AKI (acute kidney injury) ([place]) 10/18/2015  . CKD (chronic kidney disease) stage 3, GFR 30-59 ml/min ([facility]) 10/18/2015  . Nodule of left lung 10/18/2015  . Neuropathy 08/22/2014  . Hypothyroidism 08/22/2014  . Chronic pain 08/22/2014  . Chronic congestive heart failure with left ventricular diastolic dysfunction ([place]) 08/08/2013  . Chest pain 08/05/2013  . Aortic stenosis 12/22/2012  . Hyperlipidemia 12/22/2012  . PVC (premature ventricular contraction) 12/22/2012  . HTN (hypertension) 12/22/2012  . 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 Open angle with borderline findings, high  risk 12/15/2011  . Cataract, nuclear 05/25/2011    Current Outpatient Medications on File Prior to Visit  Medication Sig Dispense Refill  . Abatacept (ORENCIA IV) Inject into the vein. Daughters are not sure of doses. Received in the short stay area once a month    . acetaminophen (TYLENOL) 500 MG tablet Take 1,000 mg by mouth every 6 (six) hours as needed for mild pain.    Marland Kitchen aspirin EC 81 MG EC tablet Take 1 tablet (81 mg total) by mouth daily. 30 tablet 0  . atorvastatin (LIPITOR) 80 MG tablet Take 80 mg by mouth daily at 6 PM.     . BD INSULIN SYRINGE ULTRAFINE 31G X 15/64" 0.3 ML MISC     . cholecalciferol (VITAMIN D) 1000 UNITS tablet Take 1,000 Units by mouth daily.     . clotrimazole-betamethasone (LOTRISONE) cream as needed.     . cyclobenzaprine (FLEXERIL) 10 MG tablet Take 10 mg by mouth at bedtime.     . ferrous sulfate (FEOSOL) 325 (65 FE) MG tablet Take 325-650 mg by mouth daily with breakfast.    . folic acid (FOLVITE) 1 MG tablet Take 1 mg by mouth daily.    Marland Kitchen HYDROcodone-acetaminophen (NORCO) 5-325 MG tablet Take 1 tablet by mouth every 6 (six) hours as needed for moderate pain. 21 tablet 0  . insulin NPH-regular Human (NOVOLIN 70/30) (70-30) 100 UNIT/ML injection Inject 6-8 Units into the skin 3 (three) times daily with meals. Sliding source    . levothyroxine (SYNTHROID, LEVOTHROID) 175 MCG tablet TAKE 1 TABLET BY MOUTH ONCE DAILY IN THE MORNING ON AN EMPTY STOMACH  2  . losartan (COZAAR) 50 MG tablet Take 50 mg  by mouth every evening.    . Multiple Vitamins-Minerals (MULTIVITAMIN ADULTS PO) 1 tablet daily.    . multivitamin (THERAGRAN) per tablet Take 1 tablet by mouth daily.     . pregabalin (LYRICA) 75 MG capsule 1 bid 28 capsule 0  . VITAMIN E PO Take 1 tablet by mouth daily.    Marland Kitchen allopurinol (ZYLOPRIM) 300 MG tablet Take 1 tablet (300 mg total) by mouth daily. 30 tablet 5   Current Facility-Administered Medications on File Prior to Visit  Medication Dose Route  Frequency Provider Last Rate Last Dose  . triamcinolone acetonide (KENALOG) 10 MG/ML injection 10 mg  10 mg Other Once Landis Martins, DPM      . triamcinolone acetonide (KENALOG) 10 MG/ML injection 10 mg  10 mg Other Once Landis Martins, DPM        Allergies  Allergen Reactions  . Methotrexate Derivatives Other (See Comments)    Elevated creat. Levels  . Other Other (See Comments)    NO Blood products.  [name]- causes low blood sugar (pt states she was taking medication but cant remember what it was for.)  . Penicillins Rash    Has patient had a PCN reaction causing immediate rash, facial/tongue/throat swelling, SOB or lightheadedness with hypotension: Yes Has patient had a PCN reaction causing severe rash involving mucus membranes or skin necrosis: Yes  Has patient had a PCN reaction that required hospitalization No Has patient had a PCN reaction occurring within the last 10 years: No If all of the above answers are "NO", then may proceed with Cephalosporin use.   . Remicade [Infliximab] Other (See Comments)    Inadequate response   . Zocor [Simvastatin] Other (See Comments)    Leg cramps     Objective:  General: Alert and oriented x3 in no acute distress  Dermatology: No open lesions bilateral lower extremities, no webspace macerations, no ecchymosis bilateral, all nails x 10 are short and thick but well manicured, callus noted distal tuft of fourth toe right foot greater than left  Neurovascular status unchanged from prior  Musculoskeletal: Mild tenderness with palpation at right ankle along the lateral ankle ligaments and peroneal tendon course greater than the posterior tibial tendon/medial ankle on right, negative talar tilt, Negative tib-fib stress, No instability. No pain with calf compression bilateral. Range of motion within normal limits with mild guarding on right ankle.  Unchanged bunion hammertoe and pes planus deformity.  Strength within normal limits in all  groups bilateral.   Gait: Antalgic gait  Xrays  Right ankle   Impression: Old fibular fracture well-healed, there is mild soft tissue swelling and mild arthritis otherwise no other acute findings.  Assessment and Plan: Problem List Items Addressed This Visit    None    Visit Diagnoses    Tendonitis of ankle or foot    -  Primary   Acute right ankle pain       Type 2 diabetes mellitus with diabetic neuropathy, with long-term current use of insulin ([facility])           -Complete examination performed -Xrays reviewed -Discussed treatement options -Rx -Patient to return to office as needed or sooner if condition worsens.  Landis Martins, DPM

## 2018-01-11 ENCOUNTER — Ambulatory Visit (HOSPITAL_COMMUNITY)
Admission: RE | Admit: 2018-01-11 | Discharge: 2018-01-11 | Disposition: A | Discharge status: Home / Self Care | Payer: Medicare Other | Source: Ambulatory Visit | Attending: Rheumatology | Admitting: Rheumatology

## 2018-01-11 DIAGNOSIS — M0609 Rheumatoid arthritis without rheumatoid factor, multiple sites: Secondary | ICD-10-CM | POA: Insufficient documentation

## 2018-01-11 LAB — CBC
HCT: 29.8 % — ABNORMAL LOW (ref 36.0–46.0)
Hemoglobin: 9.3 g/dL — ABNORMAL LOW (ref 12.0–15.0)
MCH: 27.9 pg (ref 26.0–34.0)
MCHC: 31.2 g/dL (ref 30.0–36.0)
MCV: 89.5 fL (ref 80.0–100.0)
Platelets: 305 10*3/uL (ref 150–400)
RBC: 3.33 MIL/uL — ABNORMAL LOW (ref 3.87–5.11)
RDW: 16 % — ABNORMAL HIGH (ref 11.5–15.5)
WBC: 7.7 10*3/uL (ref 4.0–10.5)
nRBC: 0 % (ref 0.0–0.2)

## 2018-01-11 LAB — COMPREHENSIVE METABOLIC PANEL
ALT: 12 U/L (ref 0–44)
AST: 23 U/L (ref 15–41)
Albumin: 3.6 g/dL (ref 3.5–5.0)
Alkaline Phosphatase: 78 U/L (ref 38–126)
Anion gap: 8 (ref 5–15)
BUN: 17 mg/dL (ref 8–23)
CO2: 24 mmol/L (ref 22–32)
Calcium: 9.4 mg/dL (ref 8.9–10.3)
Chloride: 103 mmol/L (ref 98–111)
Creatinine, Ser: 1.44 mg/dL — ABNORMAL HIGH (ref 0.44–1.00)
GFR calc non Af Amer: 35 mL/min — ABNORMAL LOW (ref >60)
GFR, EST AFRICAN AMERICAN: 40 mL/min — AB (ref >60)
Glucose, Bld: 102 mg/dL — ABNORMAL HIGH (ref 70–99)
Potassium: 4.6 mmol/L (ref 3.5–5.1)
Sodium: 135 mmol/L (ref 135–145)
Total Bilirubin: 0.7 mg/dL (ref 0.3–1.2)
Total Protein: 7 g/dL (ref 6.5–8.1)

## 2018-01-11 MED ORDER — DIPHENHYDRAMINE HCL 25 MG PO CAPS
ORAL_CAPSULE | ORAL | Status: AC
  Filled 2018-01-11: qty 1

## 2018-01-11 MED ORDER — SODIUM CHLORIDE 0.9 % IV SOLN
750.0000 mg | INTRAVENOUS | Status: DC
  Administered 2018-01-11: 750 mg via INTRAVENOUS
  Filled 2018-01-11: qty 30

## 2018-01-11 MED ORDER — ACETAMINOPHEN 325 MG PO TABS: 650.0000 mg | ORAL_TABLET | ORAL | Status: DC

## 2018-01-11 MED ORDER — DIPHENHYDRAMINE HCL 25 MG PO CAPS
25.0000 mg | ORAL_CAPSULE | ORAL | Status: DC
  Administered 2018-01-11: 25 mg via ORAL

## 2018-01-11 NOTE — Progress Notes (Signed)
A drop in hemoglobin noted.  Please notify patient and forward labs to her PCP.

## 2018-01-18 ENCOUNTER — Encounter: Payer: Self-pay | Admitting: *Deleted

## 2018-01-18 ENCOUNTER — Telehealth (INDEPENDENT_AMBULATORY_CARE_PROVIDER_SITE_OTHER): Payer: Self-pay | Admitting: Radiology

## 2018-01-18 NOTE — Telephone Encounter (Signed)
Patient left voicemail on the triage line that she had gotten a message to call us back at the office. I did not see any documentation in chart. She is a patient of Dr. Estanislado Pandy.  Please return call. [phone]

## 2018-01-18 NOTE — Telephone Encounter (Signed)
Documentation for calls made to patient are in the lab note. Spoke with patient and advised her of lab results and copy of labs sent to PCP.

## 2018-01-19 ENCOUNTER — Ambulatory Visit (INDEPENDENT_AMBULATORY_CARE_PROVIDER_SITE_OTHER): Payer: Medicare Other | Admitting: Sports Medicine

## 2018-01-19 ENCOUNTER — Ambulatory Visit: Payer: Medicare Other | Admitting: Orthotics

## 2018-01-19 ENCOUNTER — Encounter: Payer: Self-pay | Admitting: Sports Medicine

## 2018-01-19 DIAGNOSIS — M79674 Pain in right toe(s): Secondary | ICD-10-CM | POA: Diagnosis not present

## 2018-01-19 DIAGNOSIS — M7750 Other enthesopathy of unspecified foot: Secondary | ICD-10-CM

## 2018-01-19 DIAGNOSIS — M2142 Flat foot [pes planus] (acquired), left foot: Secondary | ICD-10-CM

## 2018-01-19 DIAGNOSIS — M79675 Pain in left toe(s): Secondary | ICD-10-CM

## 2018-01-19 DIAGNOSIS — Z794 Long term (current) use of insulin: Secondary | ICD-10-CM

## 2018-01-19 DIAGNOSIS — B351 Tinea unguium: Secondary | ICD-10-CM

## 2018-01-19 DIAGNOSIS — E1140 Type 2 diabetes mellitus with diabetic neuropathy, unspecified: Secondary | ICD-10-CM

## 2018-01-19 DIAGNOSIS — L84 Corns and callosities: Secondary | ICD-10-CM

## 2018-01-19 DIAGNOSIS — M25571 Pain in right ankle and joints of right foot: Secondary | ICD-10-CM

## 2018-01-19 DIAGNOSIS — M2141 Flat foot [pes planus] (acquired), right foot: Secondary | ICD-10-CM

## 2018-01-19 NOTE — Progress Notes (Signed)

## 2018-01-19 NOTE — Progress Notes (Signed)
Patient ID: [name], female   DOB: 1940-02-28, 78 y.o.   MRN: [id number]  Subjective: [name] is a 78 y.o. female patient with history of type 2 diabetes who returns to office today complaining of long, painful nails and callus while ambulating in shoes; unable to trim. Patient states that the glucose reading this morning was not recorded. Patient last A1c was 6.5.  Patient was also measured for diabetic shoes at today's visit.  Patient denies any other symptoms.   Patient Active Problem List   Diagnosis Date Noted  . Primary osteoarthritis of right knee 09/25/2016  . 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 Elevated d-dimer   . Encephalopathy   . Sinus bradycardia   . Acute encephalopathy 02/27/2016  . Seropositive rheumatoid arthritis of multiple sites ([place]) 01/17/2016  . Bilateral hand pain 01/17/2016  . Bilateral wrist pain 01/17/2016  . Pain in right elbow 01/17/2016  . Left shoulder pain 01/17/2016  . Encounter for therapeutic drug monitoring 11/02/2015  . Bronchiectasis without complication ([place]) 00/17/4944  . Chronic gout involving toe without tophus 10/30/2015  . High risk medication use 10/29/2015  . Gout 10/29/2015  . H/O total knee replacement, left 10/29/2015  . 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 Diabetes mellitus, type II, insulin dependent ([place]) 10/18/2015  . AKI (acute kidney injury) ([place]) 10/18/2015  . CKD (chronic kidney disease) stage 3, GFR 30-59 ml/min ([facility]) 10/18/2015  . Nodule of left lung 10/18/2015  . Neuropathy 08/22/2014  . Hypothyroidism 08/22/2014  . Chronic pain 08/22/2014  . Chronic congestive heart failure with left ventricular diastolic dysfunction ([place]) 08/08/2013  . Chest pain 08/05/2013  . Aortic stenosis 12/22/2012  . Hyperlipidemia 12/22/2012  . PVC (premature ventricular contraction) 12/22/2012  . HTN (hypertension) 12/22/2012  . Stokes-Adams syncope  12/22/2012  . Open angle with borderline findings, high risk 12/15/2011  . Cataract, nuclear 05/25/2011   Current Outpatient Medications on File Prior to Visit  Medication Sig Dispense Refill  . Abatacept (ORENCIA IV) Inject into the vein. Daughters are not sure of doses. Received in the short stay area once a month    . acetaminophen (TYLENOL) 500 MG tablet Take 1,000 mg by mouth every 6 (six) hours as needed for mild pain.    Marland Kitchen allopurinol (ZYLOPRIM) 300 MG tablet Take 1 tablet (300 mg total) by mouth daily. 30 tablet 5  . aspirin EC 81 MG EC tablet Take 1 tablet (81 mg total) by mouth daily. 30 tablet 0  . atorvastatin (LIPITOR) 80 MG tablet Take 80 mg by mouth daily at 6 PM.     . BD INSULIN SYRINGE ULTRAFINE 31G X 15/64" 0.3 ML MISC     . cholecalciferol (VITAMIN D) 1000 UNITS tablet Take 1,000 Units by mouth daily.     . clotrimazole-betamethasone (LOTRISONE) cream as needed.     . cyclobenzaprine (FLEXERIL) 10 MG tablet Take 10 mg by mouth at bedtime.     . ferrous sulfate (FEOSOL) 325 (65 FE) MG tablet Take 325-650 mg by mouth daily with breakfast.    . folic acid (FOLVITE) 1 MG tablet Take 1 mg by mouth daily.    Marland Kitchen HYDROcodone-acetaminophen (NORCO) 5-325 MG tablet Take 1 tablet by mouth every 6 (six) hours as needed for moderate pain. 21 tablet 0  . insulin NPH-regular Human (NOVOLIN 70/30) (70-30) 100 UNIT/ML injection Inject 6-8 Units into the skin 3 (three) times daily with meals. Sliding source    .  levothyroxine (SYNTHROID, LEVOTHROID) 175 MCG tablet TAKE 1 TABLET BY MOUTH ONCE DAILY IN THE MORNING ON AN EMPTY STOMACH  2  . losartan (COZAAR) 50 MG tablet Take 50 mg by mouth every evening.    . Multiple Vitamins-Minerals (MULTIVITAMIN ADULTS PO) 1 tablet daily.    . multivitamin (THERAGRAN) per tablet Take 1 tablet by mouth daily.     . pregabalin (LYRICA) 75 MG capsule 1 bid 28 capsule 0  . VITAMIN E PO Take 1 tablet by mouth daily.     Current Facility-Administered  Medications on File Prior to Visit  Medication Dose Route Frequency Provider Last Rate Last Dose  . triamcinolone acetonide (KENALOG) 10 MG/ML injection 10 mg  10 mg Other Once Landis Martins, DPM      . triamcinolone acetonide (KENALOG) 10 MG/ML injection 10 mg  10 mg Other Once Landis Martins, DPM       Allergies  Allergen Reactions  . Methotrexate Derivatives Other (See Comments)    Elevated creat. Levels  . Other Other (See Comments)    NO Blood products.  [name]- causes low blood sugar (pt states she was taking medication but cant remember what it was for.)  . Penicillins Rash    Has patient had a PCN reaction causing immediate rash, facial/tongue/throat swelling, SOB or lightheadedness with hypotension: Yes Has patient had a PCN reaction causing severe rash involving mucus membranes or skin necrosis: Yes  Has patient had a PCN reaction that required hospitalization No Has patient had a PCN reaction occurring within the last 10 years: No If all of the above answers are "NO", then may proceed with Cephalosporin use.   . Remicade [Infliximab] Other (See Comments)    Inadequate response   . Zocor [Simvastatin] Other (See Comments)    Leg cramps     Objective: General: Patient is awake, alert, and oriented x 3 and in no acute distress.  Integument: Skin is warm, dry and supple bilateral. Nails are tender, long, thickened and dystrophic with subungual debris, consistent with onychomycosis, 1-5 bilateral. No open lesions. + callus/ preulcerative lesions present at left 4th and  right 4th toe distal tuft with dry blood, with no signs of infection. Remaining integument unremarkable.  Vasculature:  Dorsalis Pedis pulse 1/4 bilateral. Posterior Tibial pulse  1/4 bilateral. Capillary fill time <3 sec 1-5 bilateral. Scant hair growth to the level of the digits.Temperature gradient within normal limits. No varicosities present bilateral. Trace edema present bilateral.   Neurology:  The patient has diminished sensation measured with a 5.07/10g Semmes Weinstein Monofilament at all pedal sites bilateral . Vibratory sensation diminished bilateral with tuning fork. No Babinski sign present bilateral.   Musculoskeletal: +Bunion and hammertoe pedal deformities noted bilateral.  No reproducible right ankle pain noted at today's visit.  Pes planus foot type. Muscular strength 5/5 in all lower extremity muscular groups bilateral. No tenderness with calf compression bilateral.   Assessment and Plan: Problem List Items Addressed This Visit    None    Visit Diagnoses    Pain due to onychomycosis of toenails of both feet    -  Primary   Pre-ulcerative calluses       Type 2 diabetes mellitus with diabetic neuropathy, with long-term current use of insulin ([place])         -Examined patient. -Discussed and educated patient on diabetic foot care, especially with  regards to the vascular, neurological and musculoskeletal systems.  -Mechanically debrided all nails 1-5 bilateral using sterile nail nipper  and filed with dremel without incident  -Debrided callus at right and left 4th toes using sterile chisel blade without incident. -Recommend to continue with ankle support on the right to prevent recurrence of ankle pain -Answered all patient questions -Patient to return as in 10-12 weeks for nail and callus care  -Patient advised to call the office if any problems or questions arise in the meantime.  Landis Martins, DPM

## 2018-02-02 ENCOUNTER — Ambulatory Visit (INDEPENDENT_AMBULATORY_CARE_PROVIDER_SITE_OTHER): Payer: Medicare Other | Admitting: Orthotics

## 2018-02-02 DIAGNOSIS — M7751 Other enthesopathy of right foot: Secondary | ICD-10-CM

## 2018-02-02 DIAGNOSIS — E1140 Type 2 diabetes mellitus with diabetic neuropathy, unspecified: Secondary | ICD-10-CM

## 2018-02-02 DIAGNOSIS — M7752 Other enthesopathy of left foot: Secondary | ICD-10-CM

## 2018-02-02 DIAGNOSIS — M79675 Pain in left toe(s): Secondary | ICD-10-CM

## 2018-02-02 DIAGNOSIS — L84 Corns and callosities: Secondary | ICD-10-CM | POA: Diagnosis not present

## 2018-02-02 DIAGNOSIS — B351 Tinea unguium: Secondary | ICD-10-CM

## 2018-02-02 DIAGNOSIS — Z794 Long term (current) use of insulin: Secondary | ICD-10-CM

## 2018-02-02 DIAGNOSIS — M7750 Other enthesopathy of unspecified foot: Secondary | ICD-10-CM

## 2018-02-02 DIAGNOSIS — M79674 Pain in right toe(s): Secondary | ICD-10-CM

## 2018-02-02 NOTE — Progress Notes (Signed)
Patient came in today to pick up diabetic shoess.  Same was well pleased with fit and function.   The patient could ambulate without any discomfort; there were no signs of any quality issues. TThe shoes fit well with no heel slippage and areas of pressure concern.   Patient advised to contact us if any problems arise.  Patient also advised on how to report any issues.

## 2018-02-08 ENCOUNTER — Ambulatory Visit (HOSPITAL_COMMUNITY)
Admission: RE | Admit: 2018-02-08 | Discharge: 2018-02-08 | Disposition: A | Discharge status: Home / Self Care | Payer: Medicare Other | Source: Ambulatory Visit | Attending: Rheumatology | Admitting: Rheumatology

## 2018-02-08 DIAGNOSIS — M0609 Rheumatoid arthritis without rheumatoid factor, multiple sites: Secondary | ICD-10-CM

## 2018-02-08 MED ORDER — ACETAMINOPHEN 325 MG PO TABS: 650.0000 mg | ORAL_TABLET | ORAL | Status: DC

## 2018-02-08 MED ORDER — DIPHENHYDRAMINE HCL 25 MG PO CAPS
ORAL_CAPSULE | ORAL | Status: AC
  Filled 2018-02-08: qty 1

## 2018-02-08 MED ORDER — SODIUM CHLORIDE 0.9 % IV SOLN
750.0000 mg | INTRAVENOUS | Status: DC
  Administered 2018-02-08: 750 mg via INTRAVENOUS
  Filled 2018-02-08: qty 30

## 2018-02-08 MED ORDER — DIPHENHYDRAMINE HCL 25 MG PO CAPS
25.0000 mg | ORAL_CAPSULE | ORAL | Status: DC
  Administered 2018-02-08: 25 mg via ORAL

## 2018-02-10 ENCOUNTER — Emergency Department (HOSPITAL_COMMUNITY): Payer: Medicare Other

## 2018-02-10 ENCOUNTER — Encounter (HOSPITAL_COMMUNITY): Payer: Self-pay

## 2018-02-10 ENCOUNTER — Observation Stay (HOSPITAL_COMMUNITY)
Admission: EM | Admit: 2018-02-10 | Discharge: 2018-02-12 | Disposition: A | Discharge status: Home / Self Care | Payer: Medicare Other | Attending: Family Medicine | Admitting: Family Medicine

## 2018-02-10 ENCOUNTER — Emergency Department (HOSPITAL_BASED_OUTPATIENT_CLINIC_OR_DEPARTMENT_OTHER): Payer: Medicare Other

## 2018-02-10 DIAGNOSIS — R001 Bradycardia, unspecified: Secondary | ICD-10-CM | POA: Diagnosis not present

## 2018-02-10 DIAGNOSIS — R2689 Other abnormalities of gait and mobility: Secondary | ICD-10-CM | POA: Insufficient documentation

## 2018-02-10 DIAGNOSIS — E785 Hyperlipidemia, unspecified: Secondary | ICD-10-CM | POA: Insufficient documentation

## 2018-02-10 DIAGNOSIS — E119 Type 2 diabetes mellitus without complications: Secondary | ICD-10-CM

## 2018-02-10 DIAGNOSIS — I10 Essential (primary) hypertension: Secondary | ICD-10-CM | POA: Diagnosis not present

## 2018-02-10 DIAGNOSIS — Z7982 Long term (current) use of aspirin: Secondary | ICD-10-CM | POA: Diagnosis not present

## 2018-02-10 DIAGNOSIS — I959 Hypotension, unspecified: Secondary | ICD-10-CM | POA: Diagnosis not present

## 2018-02-10 DIAGNOSIS — R51 Headache: Secondary | ICD-10-CM | POA: Diagnosis not present

## 2018-02-10 DIAGNOSIS — Z79899 Other long term (current) drug therapy: Secondary | ICD-10-CM | POA: Diagnosis not present

## 2018-02-10 DIAGNOSIS — Z794 Long term (current) use of insulin: Secondary | ICD-10-CM | POA: Diagnosis not present

## 2018-02-10 DIAGNOSIS — R0602 Shortness of breath: Secondary | ICD-10-CM | POA: Diagnosis not present

## 2018-02-10 DIAGNOSIS — I951 Orthostatic hypotension: Secondary | ICD-10-CM | POA: Diagnosis not present

## 2018-02-10 DIAGNOSIS — R609 Edema, unspecified: Secondary | ICD-10-CM | POA: Diagnosis not present

## 2018-02-10 DIAGNOSIS — R55 Syncope and collapse: Secondary | ICD-10-CM

## 2018-02-10 DIAGNOSIS — M069 Rheumatoid arthritis, unspecified: Secondary | ICD-10-CM | POA: Diagnosis not present

## 2018-02-10 DIAGNOSIS — N183 Chronic kidney disease, stage 3 unspecified: Secondary | ICD-10-CM | POA: Diagnosis present

## 2018-02-10 DIAGNOSIS — M7989 Other specified soft tissue disorders: Secondary | ICD-10-CM | POA: Diagnosis not present

## 2018-02-10 DIAGNOSIS — R946 Abnormal results of thyroid function studies: Secondary | ICD-10-CM | POA: Diagnosis not present

## 2018-02-10 DIAGNOSIS — Z87891 Personal history of nicotine dependence: Secondary | ICD-10-CM | POA: Diagnosis not present

## 2018-02-10 DIAGNOSIS — E039 Hypothyroidism, unspecified: Secondary | ICD-10-CM | POA: Diagnosis present

## 2018-02-10 DIAGNOSIS — I350 Nonrheumatic aortic (valve) stenosis: Secondary | ICD-10-CM | POA: Diagnosis not present

## 2018-02-10 DIAGNOSIS — R7989 Other specified abnormal findings of blood chemistry: Secondary | ICD-10-CM

## 2018-02-10 DIAGNOSIS — I5032 Chronic diastolic (congestive) heart failure: Secondary | ICD-10-CM | POA: Diagnosis not present

## 2018-02-10 DIAGNOSIS — I129 Hypertensive chronic kidney disease with stage 1 through stage 4 chronic kidney disease, or unspecified chronic kidney disease: Secondary | ICD-10-CM | POA: Diagnosis not present

## 2018-02-10 DIAGNOSIS — R231 Pallor: Secondary | ICD-10-CM | POA: Diagnosis not present

## 2018-02-10 DIAGNOSIS — R42 Dizziness and giddiness: Secondary | ICD-10-CM | POA: Diagnosis not present

## 2018-02-10 LAB — GLUCOSE, CAPILLARY: GLUCOSE-CAPILLARY: 120 mg/dL — AB (ref 70–99)

## 2018-02-10 LAB — COMPREHENSIVE METABOLIC PANEL
ALK PHOS: 68 U/L (ref 38–126)
ALT: 10 U/L (ref 0–44)
AST: 21 U/L (ref 15–41)
Albumin: 4.2 g/dL (ref 3.5–5.0)
Anion gap: 10 (ref 5–15)
BUN: 11 mg/dL (ref 8–23)
CALCIUM: 9.8 mg/dL (ref 8.9–10.3)
CO2: 27 mmol/L (ref 22–32)
CREATININE: 1.48 mg/dL — AB (ref 0.44–1.00)
Chloride: 104 mmol/L (ref 98–111)
GFR calc Af Amer: 39 mL/min — ABNORMAL LOW (ref >60)
GFR calc non Af Amer: 33 mL/min — ABNORMAL LOW (ref >60)
Glucose, Bld: 123 mg/dL — ABNORMAL HIGH (ref 70–99)
Potassium: 4 mmol/L (ref 3.5–5.1)
Sodium: 141 mmol/L (ref 135–145)
Total Bilirubin: 0.6 mg/dL (ref 0.3–1.2)
Total Protein: 7.7 g/dL (ref 6.5–8.1)

## 2018-02-10 LAB — CBC WITH DIFFERENTIAL/PLATELET
Abs Immature Granulocytes: 0.03 10*3/uL (ref 0.00–0.07)
Basophils Absolute: 0 10*3/uL (ref 0.0–0.1)
Basophils Relative: 1 %
Eosinophils Absolute: 0.1 10*3/uL (ref 0.0–0.5)
Eosinophils Relative: 2 %
HCT: 42.3 % (ref 36.0–46.0)
HEMOGLOBIN: 13.8 g/dL (ref 12.0–15.0)
Immature Granulocytes: 1 %
Lymphocytes Relative: 36 %
Lymphs Abs: 2.1 10*3/uL (ref 0.7–4.0)
MCH: 28.8 pg (ref 26.0–34.0)
MCHC: 32.6 g/dL (ref 30.0–36.0)
MCV: 88.3 fL (ref 80.0–100.0)
MONO ABS: 0.2 10*3/uL (ref 0.1–1.0)
Monocytes Relative: 4 %
Neutro Abs: 3.4 10*3/uL (ref 1.7–7.7)
Neutrophils Relative %: 56 %
Platelets: 230 10*3/uL (ref 150–400)
RBC: 4.79 MIL/uL (ref 3.87–5.11)
RDW: 15.9 % — ABNORMAL HIGH (ref 11.5–15.5)
WBC: 5.9 10*3/uL (ref 4.0–10.5)
nRBC: 0 % (ref 0.0–0.2)

## 2018-02-10 LAB — MAGNESIUM: Magnesium: 2 mg/dL (ref 1.7–2.4)

## 2018-02-10 LAB — TSH: TSH: 87.941 u[IU]/mL — ABNORMAL HIGH (ref 0.350–4.500)

## 2018-02-10 LAB — LACTIC ACID, PLASMA: Lactic Acid, Venous: 1.6 mmol/L (ref 0.5–1.9)

## 2018-02-10 LAB — I-STAT TROPONIN, ED
TROPONIN I, POC: 0.01 ng/mL (ref 0.00–0.08)
TROPONIN I, POC: 0 ng/mL (ref 0.00–0.08)

## 2018-02-10 LAB — LIPASE, BLOOD: Lipase: 28 U/L (ref 11–51)

## 2018-02-10 LAB — PROTIME-INR
INR: 0.93
Prothrombin Time: 12.3 seconds (ref 11.4–15.2)

## 2018-02-10 LAB — BRAIN NATRIURETIC PEPTIDE: B Natriuretic Peptide: 33.3 pg/mL (ref 0.0–100.0)

## 2018-02-10 LAB — AMMONIA: Ammonia: 24 umol/L (ref 9–35)

## 2018-02-10 LAB — T4, FREE: Free T4: 0.24 ng/dL — ABNORMAL LOW (ref 0.82–1.77)

## 2018-02-10 MED ORDER — DIPHENHYDRAMINE HCL 12.5 MG/5ML PO ELIX
12.5000 mg | ORAL_SOLUTION | Freq: Once | ORAL | Status: AC
  Administered 2018-02-10: 12.5 mg via ORAL
  Filled 2018-02-10: qty 10

## 2018-02-10 MED ORDER — ENOXAPARIN SODIUM 40 MG/0.4ML ~~LOC~~ SOLN
40.0000 mg | SUBCUTANEOUS | Status: DC
  Administered 2018-02-11 (×2): 40 mg via SUBCUTANEOUS
  Filled 2018-02-10 (×2): qty 0.4

## 2018-02-10 MED ORDER — LOSARTAN POTASSIUM 50 MG PO TABS
50.0000 mg | ORAL_TABLET | Freq: Every evening | ORAL | Status: DC
  Administered 2018-02-10 – 2018-02-11 (×2): 50 mg via ORAL
  Filled 2018-02-10 (×2): qty 1

## 2018-02-10 MED ORDER — LEVOTHYROXINE SODIUM 100 MCG/5ML IV SOLN
100.0000 ug | Freq: Every day | INTRAVENOUS | Status: DC
  Administered 2018-02-11 (×2): 100 ug via INTRAVENOUS
  Filled 2018-02-10 (×2): qty 5

## 2018-02-10 MED ORDER — INSULIN ASPART 100 UNIT/ML ~~LOC~~ SOLN: 0.0000 [IU] | Freq: Three times a day (TID) | SUBCUTANEOUS | Status: DC

## 2018-02-10 MED ORDER — INSULIN ASPART 100 UNIT/ML ~~LOC~~ SOLN: 0.0000 [IU] | Freq: Every day | SUBCUTANEOUS | Status: DC

## 2018-02-10 MED ORDER — SODIUM CHLORIDE 0.9 % IV BOLUS
1000.0000 mL | Freq: Once | INTRAVENOUS | Status: AC
  Administered 2018-02-10: 1000 mL via INTRAVENOUS

## 2018-02-10 MED ORDER — SODIUM CHLORIDE 0.9% FLUSH
3.0000 mL | Freq: Two times a day (BID) | INTRAVENOUS | Status: DC
  Administered 2018-02-11 (×3): 3 mL via INTRAVENOUS

## 2018-02-10 MED ORDER — INSULIN ASPART 100 UNIT/ML ~~LOC~~ SOLN
0.0000 [IU] | Freq: Three times a day (TID) | SUBCUTANEOUS | Status: DC
  Administered 2018-02-11: 1 [IU] via SUBCUTANEOUS

## 2018-02-10 MED ORDER — ALLOPURINOL 300 MG PO TABS
300.0000 mg | ORAL_TABLET | Freq: Every day | ORAL | Status: DC
  Administered 2018-02-10 – 2018-02-12 (×3): 300 mg via ORAL
  Filled 2018-02-10 (×3): qty 1

## 2018-02-10 MED ORDER — VITAMIN D 25 MCG (1000 UNIT) PO TABS
1000.0000 [IU] | ORAL_TABLET | Freq: Every day | ORAL | Status: DC
  Administered 2018-02-10 – 2018-02-12 (×3): 1000 [IU] via ORAL
  Filled 2018-02-10 (×3): qty 1

## 2018-02-10 MED ORDER — ASPIRIN EC 81 MG PO TBEC
81.0000 mg | DELAYED_RELEASE_TABLET | Freq: Every day | ORAL | Status: DC
  Administered 2018-02-10 – 2018-02-12 (×3): 81 mg via ORAL
  Filled 2018-02-10 (×3): qty 1

## 2018-02-10 MED ORDER — HYDROCODONE-ACETAMINOPHEN 5-325 MG PO TABS
1.0000 | ORAL_TABLET | Freq: Three times a day (TID) | ORAL | Status: DC | PRN
  Administered 2018-02-11: 1 via ORAL
  Filled 2018-02-10: qty 1

## 2018-02-10 MED ORDER — FOLIC ACID 1 MG PO TABS
1.0000 mg | ORAL_TABLET | Freq: Every day | ORAL | Status: DC
  Administered 2018-02-10 – 2018-02-12 (×3): 1 mg via ORAL
  Filled 2018-02-10 (×3): qty 1

## 2018-02-10 MED ORDER — ATORVASTATIN CALCIUM 80 MG PO TABS
80.0000 mg | ORAL_TABLET | Freq: Every day | ORAL | Status: DC
  Administered 2018-02-11: 80 mg via ORAL
  Filled 2018-02-10: qty 1

## 2018-02-10 MED ORDER — FERROUS SULFATE 75 (15 FE) MG/ML PO SOLN
45.0000 mg | Freq: Every day | ORAL | Status: DC
  Administered 2018-02-10: 45 mg via ORAL
  Filled 2018-02-10: qty 3

## 2018-02-10 MED ORDER — PREGABALIN 75 MG PO CAPS: 75.0000 mg | ORAL_CAPSULE | Freq: Every day | ORAL | Status: DC | PRN

## 2018-02-10 NOTE — ED Notes (Signed)
ED Provider at bedside. 

## 2018-02-10 NOTE — ED Notes (Signed)
RN/phlebotomy unable to collect labs. IV team consulted. EDP aware.

## 2018-02-10 NOTE — ED Notes (Signed)
Vascular at bedside

## 2018-02-10 NOTE — ED Notes (Signed)
Phlebotomy at bedside.

## 2018-02-10 NOTE — ED Notes (Signed)
Attempted report 

## 2018-02-10 NOTE — H&P (Signed)
History and Physical    [name] JME:[id number] DOB: 02-23-1940 DOA: 02/10/2018  PCP: Delrae Rend, MD  Patient coming from: Home  I have personally briefly reviewed patient's old medical records in [place]  Chief Complaint: Dizziness, SOB  HPI: [name] is a 78 y.o. female with medical history significant of DM2, HTN, hypothyroidism, CKD stage 3, mild AS.  Patient presents to ED with several days of fatigue, 3 hours of SOB, palpitations, headache, near syncope, chills, malaise.  She reports that today at 1:00 she started having shortness of breath and headache.  She reports the headache was moderate to severe.  She denies any vision changes but did report some nausea.  No vomiting.  She denies recent trauma.  She denies chest pain or abdominal pain.  She denies significant cough or congestion.  She reports no urinary symptoms.  She says that her mouth is been very dry and she feels dehydrated.  She reports that today she called EMS when she felt like she was getting ready to pass out.  She reports he was having palpitations and felt like her heart was racing.  EMS was called and they found her to be bradycardic with a rate in the 40s.  Patient was given atropine as she was looking pale and clammy.  Patient also given some fluids.  Heart rate improved.  Patient was feeling somewhat better upon arrival to the emergency department.   ED Course: Remains with bradycardia in the 50s.  TSH 87, fT4 0.24.   Review of Systems: As per HPI otherwise 10 point review of systems negative.   Past Medical History:  Diagnosis Date  . Allergic rhinitis   . Anemia   . Aortic stenosis 12/22/2012   Mild 3/14-2.35 m/s peak velocity  . Cataract 2018  . CHF (congestive heart failure) ([place])   . Chronic pain   . CKD (chronic kidney disease), stage III ([place])   . Diabetes mellitus   . Diverticulosis   . DVT (deep venous thrombosis) ([place])   . Glaucoma   . Hemorrhoids    internal  . HTN (hypertension)   . Hypercholesteremia   . Hyperlipidemia 12/22/2012  . Hypothyroidism   . Meningioma ([place]) 2008  . Osteoarthritis   . Peripheral neuropathy   . PPD positive    6 months ago  . PVC (premature ventricular contraction) 12/22/2012  . Rheumatoid arthritis(714.0)   . Sickle cell trait ([place])   . 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 ABDOMINAL HYSTERECTOMY  1978   TAH,& BSO 1  . CATARACT EXTRACTION    . fistula repair  03/2010   partial colectomy  . intracranial surgery  11/2007   removal of meningioma  . JOINT REPLACEMENT  2001   left TKR  . LEFT HEART CATHETERIZATION WITH CORONARY ANGIOGRAM N/A 08/08/2013   Procedure: LEFT HEART CATHETERIZATION WITH CORONARY ANGIOGRAM;  Surgeon: Peter M Martinique, MD;  Location: [facility] CATH LAB;  Service: Cardiovascular;  Laterality: N/A;  . ROTATOR CUFF REPAIR  1999   BIlateral  . SIGMOIDECTOMY  2012  . TOE SURGERY Left 2018  . TONSILLECTOMY AND ADENOIDECTOMY    . TOTAL THYROIDECTOMY  1992  . wrist sx     right      reports that she quit smoking about 56 years ago. Her smoking use included cigarettes. She has  a 0.20 pack-year smoking history. She has never used smokeless tobacco. She reports current alcohol use. She reports that she does not use drugs.  Allergies  Allergen Reactions  . Methotrexate Derivatives Other (See Comments)    Elevated creat. Levels  . Other Other (See Comments)    NO Blood products.  [name]- causes low blood sugar (pt states she was taking medication but cant remember what it was for.)  . Penicillins Rash    Has patient had a PCN reaction causing immediate rash, facial/tongue/throat swelling, SOB or lightheadedness with hypotension: Yes Has patient had a PCN reaction causing severe rash involving mucus membranes or skin necrosis: Yes  Has patient had a PCN reaction that  required hospitalization No Has patient had a PCN reaction occurring within the last 10 years: No If all of the above answers are "NO", then may proceed with Cephalosporin use.   . Remicade [Infliximab] Other (See Comments)    Inadequate response   . Zocor [Simvastatin] Other (See Comments)    Leg cramps     Family History  Problem Relation Age of Onset  . Hypotension Mother   . CVA Mother   . Diabetes Mother   . Hypertension Father   . Kidney failure Father      Prior to Admission medications   Medication Sig Start Date End Date Taking? Authorizing Provider  Abatacept (ORENCIA IV) Inject into the vein. Daughters are not sure of doses. Received in the short stay area once a month    [provider]  acetaminophen (TYLENOL) 500 MG tablet Take 1,000 mg by mouth every 6 (six) hours as needed for mild pain.    [provider]  allopurinol (ZYLOPRIM) 300 MG tablet Take 1 tablet (300 mg total) by mouth daily. 03/31/17 11/24/17  Bo Merino, MD  aspirin EC 81 MG EC tablet Take 1 tablet (81 mg total) by mouth daily. 10/22/15   Dessa Phi, DO  atorvastatin (LIPITOR) 80 MG tablet Take 80 mg by mouth daily at 6 PM.  06/22/16   [provider]  BD INSULIN SYRINGE ULTRAFINE 31G X 15/64" 0.3 ML MISC  11/02/15   [provider]  cholecalciferol (VITAMIN D) 1000 UNITS tablet Take 1,000 Units by mouth daily.     [provider]  clotrimazole-betamethasone (LOTRISONE) cream as needed.  05/29/16   [provider]  cyclobenzaprine (FLEXERIL) 10 MG tablet Take 10 mg by mouth at bedtime.     [provider]  ferrous sulfate (FEOSOL) 325 (65 FE) MG tablet Take 325-650 mg by mouth daily with breakfast.    [provider]  folic acid (FOLVITE) 1 MG tablet Take 1 mg by mouth daily.    [provider]  HYDROcodone-acetaminophen (NORCO) 5-325 MG tablet Take 1 tablet by mouth every 6 (six) hours as needed for moderate pain.  07/22/16   Landis Martins, DPM  insulin NPH-regular Human (NOVOLIN 70/30) (70-30) 100 UNIT/ML injection Inject 6-8 Units into the skin 3 (three) times daily with meals. Sliding source    [provider]  levothyroxine (SYNTHROID, LEVOTHROID) 175 MCG tablet TAKE 1 TABLET BY MOUTH ONCE DAILY IN THE MORNING ON AN EMPTY STOMACH 11/04/17   [provider]  losartan (COZAAR) 50 MG tablet Take 50 mg by mouth every evening.    [provider]  Multiple Vitamins-Minerals (MULTIVITAMIN ADULTS PO) 1 tablet daily.    [provider]  multivitamin [facility]) per tablet Take 1 tablet by mouth daily.  [provider]  pregabalin (LYRICA) 75 MG capsule 1 bid 09/30/16   Bo Merino, MD  VITAMIN E PO Take 1 tablet by mouth daily.    [provider]    Physical Exam: Vitals:   02/10/18 1800 02/10/18 1830 02/10/18 1930 02/10/18 2030  BP: (!) 159/54     Pulse: (!) 53 (!) 49 (!) 51 60  Resp: 12 11 12 13   Temp:      TempSrc:      SpO2: 100% 100% 100% 100%  Weight:      Height:        Constitutional: NAD, calm, comfortable Eyes: PERRL, lids and conjunctivae normal ENMT: Mucous membranes are moist. Posterior pharynx clear of any exudate or lesions.Normal dentition.  Neck: normal, supple, no masses, no thyromegaly Respiratory: clear to auscultation bilaterally, no wheezing, no crackles. Normal respiratory effort. No accessory muscle use.  Cardiovascular: Bradycardic, 3/6 SEM Abdomen: no tenderness, no masses palpated. No hepatosplenomegaly. Bowel sounds positive.  Musculoskeletal: no clubbing / cyanosis. No joint deformity upper and lower extremities. Good ROM, no contractures. Normal muscle tone.  Skin: no rashes, lesions, ulcers. No induration Neurologic: CN 2-12 grossly intact. Sensation intact, DTR normal. Strength 5/5 in all 4.  Psychiatric: Normal judgment and insight. Alert and oriented x 3. Normal mood.    Labs on Admission: I have  personally reviewed following labs and imaging studies  CBC: Recent Labs  Lab 02/10/18 1827  WBC 5.9  NEUTROABS 3.4  HGB 13.8  HCT 42.3  MCV 88.3  PLT 761   Basic Metabolic Panel: Recent Labs  Lab 02/10/18 1827  NA 141  K 4.0  CL 104  CO2 27  GLUCOSE 123*  BUN 11  CREATININE 1.48*  CALCIUM 9.8  MG 2.0   GFR: Estimated Creatinine Clearance: 33.6 mL/min (A) (by C-G formula based on SCr of 1.48 mg/dL (H)). Liver Function Tests: Recent Labs  Lab 02/10/18 1827  AST 21  ALT 10  ALKPHOS 68  BILITOT 0.6  PROT 7.7  ALBUMIN 4.2   Recent Labs  Lab 02/10/18 1827  LIPASE 28   Recent Labs  Lab 02/10/18 1827  AMMONIA 24   Coagulation Profile: Recent Labs  Lab 02/10/18 1827  INR 0.93   Cardiac Enzymes: No results for input(s): CKTOTAL, CKMB, CKMBINDEX, TROPONINI in the last 168 hours. BNP (last 3 results) No results for input(s): PROBNP in the last 8760 hours. HbA1C: No results for input(s): HGBA1C in the last 72 hours. CBG: No results for input(s): GLUCAP in the last 168 hours. Lipid Profile: No results for input(s): CHOL, HDL, LDLCALC, TRIG, CHOLHDL, LDLDIRECT in the last 72 hours. Thyroid Function Tests: Recent Labs    02/10/18 1827  TSH 87.941*  FREET4 0.24*   Anemia Panel: No results for input(s): VITAMINB12, FOLATE, FERRITIN, TIBC, IRON, RETICCTPCT in the last 72 hours. Urine analysis:    Component Value Date/Time   COLORURINE YELLOW 02/27/2016 2002   APPEARANCEUR CLEAR 02/27/2016 2002   LABSPEC 1.015 02/27/2016 2002   LABSPEC 1.030 01/10/2010 1424   PHURINE 5.0 02/27/2016 2002   GLUCOSEU NEGATIVE 02/27/2016 2002   HGBUR NEGATIVE 02/27/2016 2002   BILIRUBINUR NEGATIVE 02/27/2016 2002   BILIRUBINUR not done 01/10/2010 Normal 02/27/2016 2002   PROTEINUR NEGATIVE 02/27/2016 2002   UROBILINOGEN 0.2 09/20/2014 1643   NITRITE NEGATIVE 02/27/2016 2002   LEUKOCYTESUR NEGATIVE 02/27/2016 2002   LEUKOCYTESUR Large 01/10/2010  1424    Radiological Exams on Admission: Dg Chest 2 View  Result Date: 02/10/2018 CLINICAL DATA:  Sudden onset dizziness, headache, shortness of breath nausea at 1300 hours today. EXAM: CHEST - 2 VIEW COMPARISON:  09/02/2017. FINDINGS: Normal sized heart. Stable minimal linear scarring in the left lower lung zone. Otherwise, clear lungs. 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 IMPRESSION: No acute abnormality. Electronically Signed   By: Claudie Revering M.D.   On: 02/10/2018 17:47   Ct Head Wo Contrast  Result Date: 02/10/2018 CLINICAL DATA:  Sudden onset dizziness, headache, shortness of breath and nausea at 1300 hours today. EXAM: CT HEAD WITHOUT CONTRAST TECHNIQUE: Contiguous axial images were obtained from the base of the skull through the vertex without intravenous contrast. COMPARISON:  02/27/2016. FINDINGS: Brain: Stable mildly enlarged ventricles and cortical sulci. Mildly progressive patchy white matter low density in both cerebral hemispheres. No intracranial hemorrhage, mass lesion or CT evidence of acute infarction. Vascular: No hyperdense vessel or unexpected calcification. Skull: Stable post right frontotemporal craniotomy changes. Mild bilateral hyperostosis frontalis. Sinuses/Orbits: Status post bilateral cataract extraction. Unremarkable included paranasal sinuses. Other: None. IMPRESSION: 1. No acute abnormality. 2. Mildly progressive mild chronic small vessel white matter ischemic changes in both cerebral hemispheres. 3. Stable mild diffuse cerebral and cerebellar atrophy. Electronically Signed   By: Claudie Revering M.D.   On: 02/10/2018 17:46   Vas [place] Lower Extremity Venous (dvt) (only [facility])  Result Date: 02/10/2018  Lower Venous Study Indications: Swelling, and Edema.  Limitations: Body habitus. Performing Technologist: Abram Sander RVS  Examination Guidelines: A complete evaluation includes B-mode imaging, spectral Doppler, color Doppler, and power Doppler as needed of  all accessible portions of each vessel. Bilateral testing is considered an integral part of a complete examination. Limited examinations for reoccurring indications may be performed as noted.  Right Venous Findings: +---------+---------------+---------+-----------+----------+--------------+          CompressibilityPhasicitySpontaneityPropertiesSummary        +---------+---------------+---------+-----------+----------+--------------+ CFV      Full           Yes      Yes                                 +---------+---------------+---------+-----------+----------+--------------+ SFJ      Full                                                        +---------+---------------+---------+-----------+----------+--------------+ FV Prox  Full                                                        +---------+---------------+---------+-----------+----------+--------------+ FV Mid   Full                                                        +---------+---------------+---------+-----------+----------+--------------+ FV DistalFull                                                        +---------+---------------+---------+-----------+----------+--------------+  PFV      Full                                                        +---------+---------------+---------+-----------+----------+--------------+ POP      Full           Yes      Yes                                 +---------+---------------+---------+-----------+----------+--------------+ PTV                                                   Not visualized +---------+---------------+---------+-----------+----------+--------------+ PERO                                                  Not visualized +---------+---------------+---------+-----------+----------+--------------+  Left Venous Findings: +---------+---------------+---------+-----------+----------+--------------+           CompressibilityPhasicitySpontaneityPropertiesSummary        +---------+---------------+---------+-----------+----------+--------------+ CFV      Full           Yes      Yes                                 +---------+---------------+---------+-----------+----------+--------------+ SFJ      Full                                                        +---------+---------------+---------+-----------+----------+--------------+ FV Prox  Full                                                        +---------+---------------+---------+-----------+----------+--------------+ FV Mid   Full                                                        +---------+---------------+---------+-----------+----------+--------------+ FV DistalFull                                                        +---------+---------------+---------+-----------+----------+--------------+ PFV      Full                                                        +---------+---------------+---------+-----------+----------+--------------+  POP      Full           Yes      Yes                                 +---------+---------------+---------+-----------+----------+--------------+ PTV      Full                                                        +---------+---------------+---------+-----------+----------+--------------+ PERO                                                  Not visualized +---------+---------------+---------+-----------+----------+--------------+    Summary: Right: There is no evidence of deep vein thrombosis in the lower extremity. No cystic structure found in the popliteal fossa. Left: There is no evidence of deep vein thrombosis in the lower extremity. No cystic structure found in the popliteal fossa.  *See table(s) above for measurements and observations.    Preliminary     EKG: Independently reviewed.  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 Symptomatic bradycardia / near syncope -  1. Syncope pathway 2. 2d echo - h/o "mild" AS, last echo in 2018, but has pretty wide pulse pressure tonight... 3. Tele monitor 4. Treat hypothyroidism with IV synthroid as below, no AMS to suggest myxedema coma though. 5. Review home meds to make sure not on any BB but looks like cards stopped these back in 2018... after an episode of syncope with bradycardia of all things 1. Sending message to [facility]. [facility] for cards to review 2. Hypothyroidism - 1. Seems like endocrinologist recently decreased synthroid dose from 200 to 175 mcg daily per patient 2. IV synthroid 100 mcg Daily, first dose now 3. DM2 - 1. Sensitive SSI AC / HS 4. HTN - 1. Continue home BP meds 5. CKD stage 3 - chronic and at baseline  DVT prophylaxis: Lovenox Code Status: Full Family Communication: Family at bedside Disposition Plan: Home after admit Consults called: message sent to [facility]. [facility] for cards eval in AM Admission status: Place in [place]    Alex Leahy, [facility]  How to contact the [facility] Attending or Consulting provider [place] or covering provider during after hours [place], for this patient?  1. Check the care team in [facility] and look for a) attending/consulting [facility] provider listed and b) the [facility] team listed 2. Log into [email or web address]  Amion Physician Scheduling and messaging for groups and whole hospitals  On call and physician scheduling software for group practices, residents, hospitalists and other medical providers for call, clinic, rotation and shift schedules. On[employer] is a hospital-wide system for scheduling doctors and paging doctors on call. EasyPlot is for scientific  plotting and data analysis.  [email or web address]  and use [place]'s universal password to access. If you do not have the  password, please contact the hospital operator.  3. Locate the [facility] provider you are looking for under [facility] and page to a number that you can be directly reached. 4. If you still have difficulty reaching the provider, please page the [facility] (Director on Call) for the Hospitalists listed on amion for assistance.  02/10/2018, 9:04 PM

## 2018-02-10 NOTE — Progress Notes (Signed)
Bilateral lower extremity venous duplex has been completed.   Preliminary results in CV Proc.   Abram Sander 02/10/2018 5:11 PM

## 2018-02-10 NOTE — ED Triage Notes (Addendum)
Pt endorses sudden onset dizziness, headache, shortness of breath and nausea starting at 1300. On EMS arrival pt clammy and bradycardic at 46 (no blockers). Pt received 0.5 mg of atropine and 200 mL of NS. Pt now warm, dry, and heart rate around 60. Pt endorses still some dizziness and headache, but has improved. Per EMS, no hx of chf/lasix, but some bilateral lower leg swelling is seen. Hx arthritis. No weakness, facial droop or slurred speech.

## 2018-02-10 NOTE — ED Provider Notes (Signed)
[facility] EMERGENCY DEPARTMENT Provider Note   CSN: [id number] Arrival date & time: 02/10/18  1517     History   Chief Complaint Chief Complaint  Patient presents with  . Dizziness  . Shortness of Breath    HPI Lailanie RANI IDLER is a 78 y.o. female.  The history is provided by the patient and medical records. No language interpreter was used.  Near Syncope  This is a new problem. The current episode started 3 to 5 hours ago. The problem occurs constantly. The problem has been gradually improving. Associated symptoms include headaches and shortness of breath. Pertinent negatives include no chest pain and no abdominal pain. Nothing aggravates the symptoms. Nothing relieves the symptoms. She has tried nothing for the symptoms. The treatment provided no relief.    Past Medical History:  Diagnosis Date  . Allergic rhinitis   . Anemia   . Aortic stenosis 12/22/2012   Mild 3/14-2.35 m/s peak velocity  . Cataract 2018  . CHF (congestive heart failure) ([place])   . Chronic pain   . CKD (chronic kidney disease), stage III ([place])   . Diabetes mellitus   . Diverticulosis   . DVT (deep venous thrombosis) ([place])   . Glaucoma   . Hemorrhoids    internal  . HTN (hypertension)   . Hypercholesteremia   . Hyperlipidemia 12/22/2012  . Hypothyroidism   . Meningioma ([place]) 2008  . Osteoarthritis   . Peripheral neuropathy   . PPD positive    6 months ago  . PVC (premature ventricular contraction) 12/22/2012  . Rheumatoid arthritis(714.0)   . Sickle cell trait ([place])   . 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 Primary osteoarthritis of right knee 09/25/2016  . 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 Elevated d-dimer   . Encephalopathy   . Sinus bradycardia   . Acute encephalopathy 02/27/2016  . 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 Bilateral hand pain 01/17/2016  . Bilateral wrist pain 01/17/2016  . Pain in right elbow 01/17/2016  . Left shoulder pain 01/17/2016  . Encounter for therapeutic drug monitoring 11/02/2015  . Bronchiectasis without complication ([place]) 42/70/6237  . Chronic gout involving toe without tophus 10/30/2015  . High risk medication use 10/29/2015  . Gout 10/29/2015  . H/O total knee replacement, left 10/29/2015  . 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 Diabetes mellitus, type II, insulin dependent ([place]) 10/18/2015  . AKI (acute kidney injury) ([place]) 10/18/2015  . CKD (chronic kidney disease) stage 3, GFR 30-59 ml/min ([facility]) 10/18/2015  . Nodule of left lung 10/18/2015  . Neuropathy 08/22/2014  . Hypothyroidism 08/22/2014  . Chronic pain 08/22/2014  . Chronic congestive heart failure with left ventricular diastolic dysfunction ([place]) 08/08/2013  . Chest pain 08/05/2013  . Aortic stenosis 12/22/2012  . Hyperlipidemia 12/22/2012  . PVC (premature ventricular contraction) 12/22/2012  . HTN (hypertension) 12/22/2012  . 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 Open angle with borderline findings, high risk 12/15/2011  . Cataract, nuclear 05/25/2011    Past Surgical History:  Procedure Laterality Date  . ABDOMINAL HYSTERECTOMY  1978   TAH,& BSO 1  . CATARACT EXTRACTION    . fistula repair  03/2010   partial colectomy  . intracranial surgery  11/2007   removal of meningioma  .  JOINT REPLACEMENT  2001   left TKR  . LEFT HEART CATHETERIZATION WITH CORONARY ANGIOGRAM N/A 08/08/2013   Procedure: LEFT HEART CATHETERIZATION WITH CORONARY ANGIOGRAM;  Surgeon: Peter M Martinique, MD;  Location: [facility] CATH LAB;  Service: Cardiovascular;  Laterality: N/A;  . ROTATOR CUFF REPAIR  1999   BIlateral  . SIGMOIDECTOMY  2012  . TOE SURGERY Left 2018  . TONSILLECTOMY AND ADENOIDECTOMY    . TOTAL THYROIDECTOMY  1992  . wrist sx     right       OB History   No obstetric history on file.      Home Medications    Prior to Admission medications   Medication Sig Start Date End Date Taking? Authorizing Provider  Abatacept (ORENCIA IV) Inject into the vein. Daughters are not sure of doses. Received in the short stay area once a month    [provider]  acetaminophen (TYLENOL) 500 MG tablet Take 1,000 mg by mouth every 6 (six) hours as needed for mild pain.    [provider]  allopurinol (ZYLOPRIM) 300 MG tablet Take 1 tablet (300 mg total) by mouth daily. 03/31/17 11/24/17  Bo Merino, MD  aspirin EC 81 MG EC tablet Take 1 tablet (81 mg total) by mouth daily. 10/22/15   Dessa Phi, DO  atorvastatin (LIPITOR) 80 MG tablet Take 80 mg by mouth daily at 6 PM.  06/22/16   [provider]  BD INSULIN SYRINGE ULTRAFINE 31G X 15/64" 0.3 ML MISC  11/02/15   [provider]  cholecalciferol (VITAMIN D) 1000 UNITS tablet Take 1,000 Units by mouth daily.     [provider]  clotrimazole-betamethasone (LOTRISONE) cream as needed.  05/29/16   [provider]  cyclobenzaprine (FLEXERIL) 10 MG tablet Take 10 mg by mouth at bedtime.     [provider]  ferrous sulfate (FEOSOL) 325 (65 FE) MG tablet Take 325-650 mg by mouth daily with breakfast.    [provider]  folic acid (FOLVITE) 1 MG tablet Take 1 mg by mouth daily.    [provider]  HYDROcodone-acetaminophen (NORCO) 5-325 MG tablet Take 1 tablet by mouth every 6 (six) hours as needed for moderate pain. 07/22/16   Landis Martins, DPM  insulin NPH-regular Human (NOVOLIN 70/30) (70-30) 100 UNIT/ML injection Inject 6-8 Units into the skin 3 (three) times daily with meals. Sliding source    [provider]  levothyroxine (SYNTHROID, LEVOTHROID) 175 MCG tablet TAKE 1 TABLET BY MOUTH ONCE DAILY IN THE MORNING ON AN EMPTY STOMACH 11/04/17   [provider]  losartan (COZAAR) 50 MG tablet  Take 50 mg by mouth every evening.    [provider]  Multiple Vitamins-Minerals (MULTIVITAMIN ADULTS PO) 1 tablet daily.    [provider]  multivitamin [facility]) per tablet Take 1 tablet by mouth daily.     [provider]  pregabalin (LYRICA) 75 MG capsule 1 bid 09/30/16   Bo Merino, MD  VITAMIN E PO Take 1 tablet by mouth daily.    [provider]    Family History Family History  Problem Relation Age of Onset  . Hypotension Mother   . CVA Mother   . Diabetes Mother   . Hypertension Father   . Kidney failure Father     Social History Social History   Tobacco Use  . Smoking status: Former Smoker    Packs/day: 0.10    Years: 2.00    Pack years: 0.20  Types: Cigarettes    Last attempt to quit: 01/06/1962    Years since quitting: 56.1  . Smokeless tobacco: Never Used  . Tobacco comment: Socially   Substance Use Topics  . Alcohol use: Yes    Comment: occasionally  . Drug use: No     Allergies   Methotrexate derivatives; Other; Penicillins; Remicade [infliximab]; and Zocor [simvastatin]   Review of Systems Review of Systems  Constitutional: Positive for chills. Negative for diaphoresis, fatigue and fever.  HENT: Negative for congestion.   Eyes: Negative for visual disturbance.  Respiratory: Positive for shortness of breath. Negative for cough, chest tightness and wheezing.   Cardiovascular: Positive for near-syncope. Negative for chest pain.  Gastrointestinal: Positive for nausea. Negative for abdominal pain, constipation, diarrhea and vomiting.  Genitourinary: Negative for dysuria, flank pain and frequency.  Musculoskeletal: Negative for back pain, neck pain and neck stiffness.  Skin: Negative for rash and wound.  Neurological: Positive for light-headedness and headaches. Negative for dizziness, seizures, speech difficulty and weakness.  Psychiatric/Behavioral: Negative for agitation.     Physical Exam Updated  Vital Signs BP (!) 153/66   Pulse (!) 50   Temp 97.7 F (36.5 C) (Oral)   Resp 14   Ht 5\' 4"  (1.626 m)   Wt 90.7 kg   LMP  (LMP Unknown)   SpO2 100%   BMI 34.33 kg/m   Physical Exam Vitals signs and nursing note reviewed.  Constitutional:      General: She is not in acute distress.    Appearance: She is not ill-appearing, toxic-appearing or diaphoretic.  HENT:     Head: Normocephalic.     Mouth/Throat:     Pharynx: No pharyngeal swelling or oropharyngeal exudate.  Eyes:     Pupils: Pupils are equal, round, and reactive to light.  Neck:     Musculoskeletal: Normal range of motion.  Cardiovascular:     Rate and Rhythm: Bradycardia present.     Pulses: Normal pulses.     Heart sounds: Murmur present.  Pulmonary:     Effort: Pulmonary effort is normal.     Breath sounds: Normal breath sounds. No decreased breath sounds, wheezing, rhonchi or rales.  Chest:     Chest wall: No tenderness.  Musculoskeletal:     Right lower leg: She exhibits no tenderness. No edema.     Left lower leg: She exhibits no tenderness. No edema.  Skin:    General: Skin is warm.     Capillary Refill: Capillary refill takes less than 2 seconds.  Neurological:     General: No focal deficit present.     Mental Status: She is alert and oriented to person, place, and time.     Cranial Nerves: No cranial nerve deficit.     Motor: No weakness.  Psychiatric:        Mood and Affect: Mood normal.      ED Treatments / Results  Labs (all labs ordered are listed, but only abnormal results are displayed) Labs Reviewed  TSH - Abnormal; Notable for the following components:      Result Value   TSH 87.941 (*)    All other components within normal limits  T4, FREE - Abnormal; Notable for the following components:   Free T4 0.24 (*)    All other components within normal limits  CBC WITH DIFFERENTIAL/PLATELET - Abnormal; Notable for the following components:   RDW 15.9 (*)    All other components within  normal limits  COMPREHENSIVE  METABOLIC PANEL - Abnormal; Notable for the following components:   Glucose, Bld 123 (*)    Creatinine, Ser 1.48 (*)    GFR calc non Af Amer 33 (*)    GFR calc Af Amer 39 (*)    All other components within normal limits  GLUCOSE, CAPILLARY - Abnormal; Notable for the following components:   Glucose-Capillary 120 (*)    All other components within normal limits  URINE CULTURE  LIPASE, BLOOD  LACTIC ACID, PLASMA  PROTIME-INR  MAGNESIUM  BRAIN NATRIURETIC PEPTIDE  AMMONIA  URINALYSIS, ROUTINE W REFLEX MICROSCOPIC  I-STAT TROPONIN, ED  CBG MONITORING, ED  I-STAT TROPONIN, ED    EKG EKG Interpretation  Date/Time:  Wednesday February 10 2018 15:23:54 EST Ventricular Rate:  51 PR Interval:    QRS Duration: 101 QT Interval:  508 QTC Calculation: 468 R Axis:   32 Text Interpretation:  Sinus rhythm Anteroseptal infarct, old When compared to prior, slower rate.  No STEMI Confirmed by Antony Blackbird [phone]) on 02/10/2018 4:23:44 PM   Radiology Dg Chest 2 View  Result Date: 02/10/2018 CLINICAL DATA:  Sudden onset dizziness, headache, shortness of breath nausea at 1300 hours today. EXAM: CHEST - 2 VIEW COMPARISON:  09/02/2017. FINDINGS: Normal sized heart. Stable minimal linear scarring in the left lower lung zone. Otherwise, clear lungs. 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 IMPRESSION: No acute abnormality. Electronically Signed   By: Claudie Revering M.D.   On: 02/10/2018 17:47   Ct Head Wo Contrast  Result Date: 02/10/2018 CLINICAL DATA:  Sudden onset dizziness, headache, shortness of breath and nausea at 1300 hours today. EXAM: CT HEAD WITHOUT CONTRAST TECHNIQUE: Contiguous axial images were obtained from the base of the skull through the vertex without intravenous contrast. COMPARISON:  02/27/2016. FINDINGS: Brain: Stable mildly enlarged ventricles and cortical sulci. Mildly progressive patchy white matter low density in both cerebral  hemispheres. No intracranial hemorrhage, mass lesion or CT evidence of acute infarction. Vascular: No hyperdense vessel or unexpected calcification. Skull: Stable post right frontotemporal craniotomy changes. Mild bilateral hyperostosis frontalis. Sinuses/Orbits: Status post bilateral cataract extraction. Unremarkable included paranasal sinuses. Other: None. IMPRESSION: 1. No acute abnormality. 2. Mildly progressive mild chronic small vessel white matter ischemic changes in both cerebral hemispheres. 3. Stable mild diffuse cerebral and cerebellar atrophy. Electronically Signed   By: Claudie Revering M.D.   On: 02/10/2018 17:46   Vas [place] Lower Extremity Venous (dvt) (only [facility])  Result Date: 02/10/2018  Lower Venous Study Indications: Swelling, and Edema.  Limitations: Body habitus. Performing Technologist: Abram Sander RVS  Examination Guidelines: A complete evaluation includes B-mode imaging, spectral Doppler, color Doppler, and power Doppler as needed of all accessible portions of each vessel. Bilateral testing is considered an integral part of a complete examination. Limited examinations for reoccurring indications may be performed as noted.  Right Venous Findings: +---------+---------------+---------+-----------+----------+--------------+          CompressibilityPhasicitySpontaneityPropertiesSummary        +---------+---------------+---------+-----------+----------+--------------+ CFV      Full           Yes      Yes                                 +---------+---------------+---------+-----------+----------+--------------+ SFJ      Full                                                        +---------+---------------+---------+-----------+----------+--------------+  FV Prox  Full                                                        +---------+---------------+---------+-----------+----------+--------------+ FV Mid   Full                                                         +---------+---------------+---------+-----------+----------+--------------+ FV DistalFull                                                        +---------+---------------+---------+-----------+----------+--------------+ PFV      Full                                                        +---------+---------------+---------+-----------+----------+--------------+ POP      Full           Yes      Yes                                 +---------+---------------+---------+-----------+----------+--------------+ PTV                                                   Not visualized +---------+---------------+---------+-----------+----------+--------------+ PERO                                                  Not visualized +---------+---------------+---------+-----------+----------+--------------+  Left Venous Findings: +---------+---------------+---------+-----------+----------+--------------+          CompressibilityPhasicitySpontaneityPropertiesSummary        +---------+---------------+---------+-----------+----------+--------------+ CFV      Full           Yes      Yes                                 +---------+---------------+---------+-----------+----------+--------------+ SFJ      Full                                                        +---------+---------------+---------+-----------+----------+--------------+ FV Prox  Full                                                        +---------+---------------+---------+-----------+----------+--------------+  FV Mid   Full                                                        +---------+---------------+---------+-----------+----------+--------------+ FV DistalFull                                                        +---------+---------------+---------+-----------+----------+--------------+ PFV      Full                                                         +---------+---------------+---------+-----------+----------+--------------+ POP      Full           Yes      Yes                                 +---------+---------------+---------+-----------+----------+--------------+ PTV      Full                                                        +---------+---------------+---------+-----------+----------+--------------+ PERO                                                  Not visualized +---------+---------------+---------+-----------+----------+--------------+    Summary: Right: There is no evidence of deep vein thrombosis in the lower extremity. No cystic structure found in the popliteal fossa. Left: There is no evidence of deep vein thrombosis in the lower extremity. No cystic structure found in the popliteal fossa.  *See table(s) above for measurements and observations. Electronically signed by Curt Jews MD on 02/10/2018 at 9:40:57 PM.    Final     Procedures Procedures (including critical care time)  Medications Ordered in ED Medications  levothyroxine (SYNTHROID, LEVOTHROID) injection 100 mcg (has no administration in time range)  sodium chloride flush (NS) 0.9 % injection 3 mL (has no administration in time range)  enoxaparin (LOVENOX) injection 40 mg (40 mg Subcutaneous Given 02/11/18 0000)  insulin aspart (novoLOG) injection 0-9 Units (has no administration in time range)  insulin aspart (novoLOG) injection 0-5 Units (0 Units Subcutaneous Not Given 02/10/18 2259)  allopurinol (ZYLOPRIM) tablet 300 mg (300 mg Oral Given 02/10/18 2354)  aspirin EC tablet 81 mg (81 mg Oral Given 02/10/18 2354)  atorvastatin (LIPITOR) tablet 80 mg (has no administration in time range)  cholecalciferol (VITAMIN D3) tablet 1,000 Units (1,000 Units Oral Given 02/10/18 2354)  HYDROcodone-acetaminophen (NORCO/VICODIN) 5-325 MG per tablet 1 tablet (has no administration in time range)  losartan (COZAAR) tablet 50 mg (50 mg Oral Given 02/10/18 2353)  pregabalin  (LYRICA) capsule 75 mg (has no administration in time range)  ferrous  sulfate (FER-IN-SOL) 75 (15 Fe) MG/ML solution 45 mg of iron (45 mg of iron Oral Given 02/08/00 5427)  folic acid (FOLVITE) tablet 1 mg (1 mg Oral Given 02/10/18 2354)  sodium chloride 0.9 % bolus 1,000 mL (0 mLs Intravenous Stopped 02/10/18 2121)  diphenhydrAMINE (BENADRYL) 12.5 MG/5ML elixir 12.5 mg (12.5 mg Oral Given 02/10/18 2356)     Initial Impression / Assessment and Plan / ED Course  I have reviewed the triage vital signs and the nursing notes.  Pertinent labs & imaging results that were available during my care of the patient were reviewed by me and considered in my medical decision making (see chart for details).     KEIYANA STEHR is a 78 y.o. female with a past medical history significant for diabetes, hypertension, hypothyroidism, CKD, prior meningioma, CHF, DVT, prior stroke, and aortic stenosis who presents with several days of fatigue and 3 hours of shortness of breath, severe palpitations, headache, lightheadedness with near syncope, chills, and malaise.  Patient reports that she has been feeling tired a lot several days.  She reports this is new.  She reports that today at 1:00 she started having shortness of breath and headache.  She reports the headache was moderate to severe.  She denies any vision changes but did report some nausea.  No vomiting.  She denies recent trauma.  She denies chest pain or abdominal pain.  She denies significant cough or congestion.  She reports no urinary symptoms.  She says that her mouth is been very dry and she feels dehydrated.  She reports that today she called EMS when she felt like she was getting ready to pass out.  She reports he was having palpitations and felt like her heart was racing.  EMS was called and they found her to be bradycardic with a rate in the 40s.  Patient was given atropine as she was looking pale and clammy.  Patient also given some fluids.  Heart rate  improved.  Patient was feeling somewhat better upon arrival to the emergency department.  She reports that she felt similar to this when she has had low sugar in the past.  Glucose has not yet been assessed.  On exam, patient is alert and oriented.  Lungs are clear and patient has a mild murmur.  Chest and abdomen nontender.  Patient moving all extremities.  Patient has edema in both legs which she reports is new.  She is unsure of the timeframe.  No numbness, tingling, or weakness.  Normal extraocular movements and pupils are symmetric and reactive.  Symmetric smile.  No slurred speech.  Normal neck range of motion with no neck tenderness.  No evidence of trauma.  Unclear etiology of the patient's chills, bradycardia, near syncope, fatigue, malaise, shortness rectum headache.  Patient will have work-up to look for occult infection, electrolyte imbalance, kidney injury, or other out abnormality.  With her headache and history meningioma, CT head will be obtained.  Will check thyroid as her symptoms may be related to hypothyroidism.  Anticipate reassessment after work-up.  She will be given more fluids during work-up.  7:58 PM Laboratory testing began to return and the most concerning is that she was found to have a TSH of greater than 87 and a low free T4.  Patient is likely having symptomatic hypothyroidism.  Patient will be admitted to hospitalist service for further management.  Final Clinical Impressions(s) / ED Diagnoses   Final diagnoses:  Symptomatic bradycardia  Hypothyroidism, unspecified type  TSH elevation    ED Discharge Orders    None     Clinical Impression: 1. Symptomatic bradycardia   2. Hypothyroidism, unspecified type   3. TSH elevation     Disposition: Admit  This note was prepared with assistance of Dragon voice recognition software. Occasional wrong-word or sound-a-like substitutions may have occurred due to the inherent limitations of voice recognition  software.         Tegeler, Gwenyth Allegra, MD 02/11/18 Dyann Kief

## 2018-02-11 ENCOUNTER — Other Ambulatory Visit: Payer: Self-pay

## 2018-02-11 ENCOUNTER — Observation Stay (HOSPITAL_BASED_OUTPATIENT_CLINIC_OR_DEPARTMENT_OTHER): Payer: Medicare Other

## 2018-02-11 ENCOUNTER — Observation Stay (HOSPITAL_COMMUNITY): Payer: Medicare Other

## 2018-02-11 DIAGNOSIS — Z794 Long term (current) use of insulin: Secondary | ICD-10-CM | POA: Diagnosis not present

## 2018-02-11 DIAGNOSIS — E890 Postprocedural hypothyroidism: Secondary | ICD-10-CM

## 2018-02-11 DIAGNOSIS — N183 Chronic kidney disease, stage 3 (moderate): Secondary | ICD-10-CM | POA: Diagnosis not present

## 2018-02-11 DIAGNOSIS — R0602 Shortness of breath: Secondary | ICD-10-CM

## 2018-02-11 DIAGNOSIS — R001 Bradycardia, unspecified: Secondary | ICD-10-CM

## 2018-02-11 DIAGNOSIS — E039 Hypothyroidism, unspecified: Secondary | ICD-10-CM

## 2018-02-11 DIAGNOSIS — I10 Essential (primary) hypertension: Secondary | ICD-10-CM | POA: Diagnosis not present

## 2018-02-11 DIAGNOSIS — E119 Type 2 diabetes mellitus without complications: Secondary | ICD-10-CM | POA: Diagnosis not present

## 2018-02-11 LAB — GLUCOSE, CAPILLARY
Glucose-Capillary: 101 mg/dL — ABNORMAL HIGH (ref 70–99)
Glucose-Capillary: 103 mg/dL — ABNORMAL HIGH (ref 70–99)
Glucose-Capillary: 136 mg/dL — ABNORMAL HIGH (ref 70–99)
Glucose-Capillary: 124 mg/dL — ABNORMAL HIGH (ref 70–99)

## 2018-02-11 LAB — URINALYSIS, ROUTINE W REFLEX MICROSCOPIC
Bilirubin Urine: NEGATIVE
Glucose, UA: NEGATIVE mg/dL
Hgb urine dipstick: NEGATIVE
Ketones, ur: NEGATIVE mg/dL
Leukocytes, UA: NEGATIVE
Nitrite: NEGATIVE
PROTEIN: NEGATIVE mg/dL
Specific Gravity, Urine: 1.011 (ref 1.005–1.030)
pH: 5 (ref 5.0–8.0)

## 2018-02-11 LAB — ECHOCARDIOGRAM COMPLETE
Height: 63 in
Weight: 2928 oz

## 2018-02-11 MED ORDER — LEVOTHYROXINE SODIUM 100 MCG PO TABS
200.0000 ug | ORAL_TABLET | Freq: Every day | ORAL | Status: DC
  Administered 2018-02-12: 200 ug via ORAL
  Filled 2018-02-11: qty 2

## 2018-02-11 MED ORDER — FERROUS SULFATE 325 (65 FE) MG PO TABS
325.0000 mg | ORAL_TABLET | Freq: Every day | ORAL | Status: DC
  Administered 2018-02-11 – 2018-02-12 (×2): 325 mg via ORAL
  Filled 2018-02-11 (×2): qty 1

## 2018-02-11 MED ORDER — FERROUS SULFATE 300 (60 FE) MG/5ML PO SYRP
300.0000 mg | ORAL_SOLUTION | Freq: Every day | ORAL | Status: DC
  Filled 2018-02-11: qty 5

## 2018-02-11 NOTE — Consult Note (Addendum)
Cardiology Consultation:   Patient ID: [name] MRN: [id number]; DOB: November 16, 1940  Admit date: 02/10/2018 Date of Consult: 02/11/2018  Primary Care Provider: Delrae Rend, MD Primary Cardiologist: Dr. Marlou Porch Primary Electrophysiologist:  Dr. Rayann Heman   Patient Profile:   [name] is a 78 y.o. female with a hx of carotid artery disease, with bilateral ICA stenosis of up to 40%, mild aortic insufficiency/stenosis, diastolic dysfunction, prior syncope, hypertension, hyperlipidemia, hypothyroidism and type 2 diabetes who is being seen today for the evaluation of symptomatic bradycardia at the request of Dr. Lonny Prude.  Previously she had an episode of syncope where she was standing in the kitchen/working felt dizzy fell then returned to consciousness as she hit the floor.  She had multiple syncopal event with no clear etiology.  She was seen by Dr. Rayann Heman February 2018 when admitted for syncope and also intense bradycardia.  Heart rate in 40 to 50s.  Stopped metoprolol.  Story did not suggest bradycardia related syncope.  Event monitor 03/2016- no adverse arrhythmia. PVCs. Carotid 02/2016 Bilateral: mild to moderate mixed plaque distal CCA and origin and proximal ICA and ECA. 1-39% ICA stenosis. Vertebral artery flow is Antegrade. Heart catheterization on 08/08/13 which was normal. Myoview 10/2015 - Low risk Echocardiogram February 2018 showed LV function of 60 to 65%, mild aortic stenosis and regurgitation.  Seen by Dr. Marlou Porch January 2019.  History of Present Illness:   [name] developed with sudden onset dizziness, headache and nausea yesterday afternoon at 1 PM.  She was sitting in recliner.  EMS noted clammy and bradycardic at 46.  Given 0.5 mg of atropine and 200 cc of normal saline.  Rate improved to 60s.  No syncope.  Patient was admitted for further evaluation.  She was noted severely abnormal thyroid function with TSH 87 and free T4 0.24.  Her Synthroid increased to  200 mcg daily.  Patient has a history of goiter s/p thyroidectomy.  Endocrinologist office updated.  Pending echocardiogram.  Patient denies chest pain, shortness of breath, orthopnea, PND or melena.   Mild lower extremity edema intermittently.  She reports longstanding history of intermittent dizziness with standing as well as with walking.  Unable to give a timeline.  No syncope.  Past Medical History:  Diagnosis Date  . Allergic rhinitis   . Anemia   . Aortic stenosis 12/22/2012   Mild 3/14-2.35 m/s peak velocity  . Cataract 2018  . CHF (congestive heart failure) ([place])   . Chronic pain   . CKD (chronic kidney disease), stage III ([place])   . Diabetes mellitus   . Diverticulosis   . DVT (deep venous thrombosis) ([place])   . Glaucoma   . Hemorrhoids    internal  . HTN (hypertension)   . Hypercholesteremia   . Hyperlipidemia 12/22/2012  . Hypothyroidism   . Meningioma ([place]) 2008  . Osteoarthritis   . Peripheral neuropathy   . PPD positive    6 months ago  . PVC (premature ventricular contraction) 12/22/2012  . Rheumatoid arthritis(714.0)   . Sickle cell trait ([place])   . 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 ABDOMINAL HYSTERECTOMY  1978   TAH,& BSO 1  . CATARACT EXTRACTION    . fistula repair  03/2010   partial colectomy  . intracranial surgery  11/2007   removal of meningioma  . JOINT REPLACEMENT  2001  left TKR  . LEFT HEART CATHETERIZATION WITH CORONARY ANGIOGRAM N/A 08/08/2013   Procedure: LEFT HEART CATHETERIZATION WITH CORONARY ANGIOGRAM;  Surgeon: Peter M Martinique, MD;  Location: [facility] CATH LAB;  Service: Cardiovascular;  Laterality: N/A;  . ROTATOR CUFF REPAIR  1999   BIlateral  . SIGMOIDECTOMY  2012  . TOE SURGERY Left 2018  . TONSILLECTOMY AND ADENOIDECTOMY    . TOTAL THYROIDECTOMY  1992  . wrist sx     right      Inpatient Medications: Scheduled  Meds: . allopurinol  300 mg Oral Daily  . aspirin EC  81 mg Oral Daily  . atorvastatin  80 mg Oral q1800  . cholecalciferol  1,000 Units Oral Daily  . enoxaparin (LOVENOX) injection  40 mg Subcutaneous Q24H  . ferrous sulfate  325 mg Oral Q breakfast  . folic acid  1 mg Oral Daily  . insulin aspart  0-5 Units Subcutaneous QHS  . insulin aspart  0-9 Units Subcutaneous TID WC  . [START ON 02/12/2018] levothyroxine  200 mcg Oral Q0600  . losartan  50 mg Oral QPM  . sodium chloride flush  3 mL Intravenous Q12H   Continuous Infusions:  PRN Meds: HYDROcodone-acetaminophen, pregabalin  Allergies:    Allergies  Allergen Reactions  . Methotrexate Derivatives Other (See Comments)    Elevated creat. Levels  . Other Other (See Comments)    NO Blood products.  [name]- causes low blood sugar (pt states she was taking medication but cant remember what it was for.)  . Penicillins Rash    Has patient had a PCN reaction causing immediate rash, facial/tongue/throat swelling, SOB or lightheadedness with hypotension: Yes Has patient had a PCN reaction causing severe rash involving mucus membranes or skin necrosis: Yes  Has patient had a PCN reaction that required hospitalization No Has patient had a PCN reaction occurring within the last 10 years: No If all of the above answers are "NO", then may proceed with Cephalosporin use.   . Remicade [Infliximab] Other (See Comments)    Inadequate response   . Zocor [Simvastatin] Other (See Comments)    Leg cramps     Social History:   Social History   Socioeconomic History  . Marital status: Married    Spouse name: Not on file  . Number of children: Not on file  . Years of education: Not on file  . Highest education level: Not on file  Occupational History  . Not on file  Social Needs  . Financial resource strain: Not on file  . Food insecurity:    Worry: Not on file    Inability: Not on file  . Transportation needs:    Medical: Not  on file    Non-medical: Not on file  Tobacco Use  . Smoking status: Former Smoker    Packs/day: 0.10    Years: 2.00    Pack years: 0.20    Types: Cigarettes    Last attempt to quit: 01/06/1962    Years since quitting: 56.1  . Smokeless tobacco: Never Used  . Tobacco comment: Socially   Substance and Sexual Activity  . Alcohol use: Yes    Comment: occasionally  . Drug use: No  . Sexual activity: Not on file  Lifestyle  . Physical activity:    Days per week: Not on file    Minutes per session: Not on file  . Stress: Not on file  Relationships  . Social connections:    Talks on phone: Not  on file    Gets together: Not on file    Attends religious service: Not on file    Active member of club or organization: Not on file    Attends meetings of clubs or organizations: Not on file    Relationship status: Not on file  . Intimate partner violence:    Fear of current or ex partner: Not on file    Emotionally abused: Not on file    Physically abused: Not on file    Forced sexual activity: Not on file  Other Topics Concern  . Not on file  Social History Narrative   Daycare Provider   Drinks about 1 cup of coffee a day     Family History:    Family History  Problem Relation Age of Onset  . Hypotension Mother   . CVA Mother   . Diabetes Mother   . Hypertension Father   . Kidney failure Father      ROS:  Please see the history of present illness.  All other ROS reviewed and negative.     Physical Exam/Data:   Vitals:   02/10/18 2119 02/10/18 2221 02/11/18 0500 02/11/18 1130  BP: (!) 163/53 (!) 173/51  (!) 156/51  Pulse: (!) 56 (!) 49 (!) 53 (!) 47  Resp: 16 12 11    Temp:  98.6 F (37 C) 98.9 F (37.2 C) 98.6 F (37 C)  TempSrc:  Oral Oral Oral  SpO2: 100% 100% 100%   Weight:  83.3 kg 83 kg   Height:  5\' 3"  (1.6 m)      Intake/Output Summary (Last 24 hours) at 02/11/2018 1428 Last data filed at 02/11/2018 0800 Gross per 24 hour  Intake 1240 ml  Output -  Net  1240 ml   Last 3 Weights 02/11/2018 02/10/2018 02/10/2018  Weight (lbs) 183 lb 183 lb 9.6 oz 200 lb  Weight (kg) 83.008 kg 83.28 kg 90.719 kg     Body mass index is 32.42 kg/m.  General:  Well nourished, well developed, in no acute distress HEENT: normal Lymph: no adenopathy Neck: no JVD Endocrine:  No thryomegaly Vascular: No carotid bruits; FA pulses 2+ bilaterally without bruits  Cardiac:  normal S1, S2; RRR; 2/6 systolic murmur  Lungs:  clear to auscultation bilaterally, no wheezing, rhonchi or rales  Abd: soft, nontender, no hepatomegaly  Ext:  Trace edema Musculoskeletal:  No deformities, BUE and BLE strength normal and equal Skin: warm and dry  Neuro:  CNs 2-12 intact, no focal abnormalities noted Psych:  Normal affect   EKG:  The EKG was personally reviewed and demonstrates: Sinus bradycardia at rate of 51 bpm Telemetry:  Telemetry was personally reviewed and demonstrates: Sinus bradycardia at rate of 40s no high degree bradycardia noted  Relevant CV Studies: As summarized above Pending echocardiogram this admission  Laboratory Data:  Chemistry Recent Labs  Lab 02/10/18 1827  NA 141  K 4.0  CL 104  CO2 27  GLUCOSE 123*  BUN 11  CREATININE 1.48*  CALCIUM 9.8  GFRNONAA 33*  GFRAA 39*  ANIONGAP 10    Recent Labs  Lab 02/10/18 1827  PROT 7.7  ALBUMIN 4.2  AST 21  ALT 10  ALKPHOS 68  BILITOT 0.6   Hematology Recent Labs  Lab 02/10/18 1827  WBC 5.9  RBC 4.79  HGB 13.8  HCT 42.3  MCV 88.3  MCH 28.8  MCHC 32.6  RDW 15.9*  PLT 230    Recent Labs  Lab 02/10/18  1835 02/10/18 2128  TROPIPOC 0.01 0.00    BNP Recent Labs  Lab 02/10/18 1827  BNP 33.3    Radiology/Studies:  Dg Chest 2 View  Result Date: 02/10/2018 CLINICAL DATA:  Sudden onset dizziness, headache, shortness of breath nausea at 1300 hours today. EXAM: CHEST - 2 VIEW COMPARISON:  09/02/2017. FINDINGS: Normal sized heart. Stable minimal linear scarring in the left lower lung zone.  Otherwise, clear lungs. 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 IMPRESSION: No acute abnormality. Electronically Signed   By: Claudie Revering M.D.   On: 02/10/2018 17:47   Ct Head Wo Contrast  Result Date: 02/10/2018 CLINICAL DATA:  Sudden onset dizziness, headache, shortness of breath and nausea at 1300 hours today. EXAM: CT HEAD WITHOUT CONTRAST TECHNIQUE: Contiguous axial images were obtained from the base of the skull through the vertex without intravenous contrast. COMPARISON:  02/27/2016. FINDINGS: Brain: Stable mildly enlarged ventricles and cortical sulci. Mildly progressive patchy white matter low density in both cerebral hemispheres. No intracranial hemorrhage, mass lesion or CT evidence of acute infarction. Vascular: No hyperdense vessel or unexpected calcification. Skull: Stable post right frontotemporal craniotomy changes. Mild bilateral hyperostosis frontalis. Sinuses/Orbits: Status post bilateral cataract extraction. Unremarkable included paranasal sinuses. Other: None. IMPRESSION: 1. No acute abnormality. 2. Mildly progressive mild chronic small vessel white matter ischemic changes in both cerebral hemispheres. 3. Stable mild diffuse cerebral and cerebellar atrophy. Electronically Signed   By: Claudie Revering M.D.   On: 02/10/2018 17:46   Vas [place] Lower Extremity Venous (dvt) (only [facility])  Result Date: 02/10/2018  Lower Venous Study Indications: Swelling, and Edema.  Limitations: Body habitus. Performing Technologist: Abram Sander RVS  Examination Guidelines: A complete evaluation includes B-mode imaging, spectral Doppler, color Doppler, and power Doppler as needed of all accessible portions of each vessel. Bilateral testing is considered an integral part of a complete examination. Limited examinations for reoccurring indications may be performed as noted.  Right Venous Findings: +---------+---------------+---------+-----------+----------+--------------+           CompressibilityPhasicitySpontaneityPropertiesSummary        +---------+---------------+---------+-----------+----------+--------------+ CFV      Full           Yes      Yes                                 +---------+---------------+---------+-----------+----------+--------------+ SFJ      Full                                                        +---------+---------------+---------+-----------+----------+--------------+ FV Prox  Full                                                        +---------+---------------+---------+-----------+----------+--------------+ FV Mid   Full                                                        +---------+---------------+---------+-----------+----------+--------------+ FV  DistalFull                                                        +---------+---------------+---------+-----------+----------+--------------+ PFV      Full                                                        +---------+---------------+---------+-----------+----------+--------------+ POP      Full           Yes      Yes                                 +---------+---------------+---------+-----------+----------+--------------+ PTV                                                   Not visualized +---------+---------------+---------+-----------+----------+--------------+ PERO                                                  Not visualized +---------+---------------+---------+-----------+----------+--------------+  Left Venous Findings: +---------+---------------+---------+-----------+----------+--------------+          CompressibilityPhasicitySpontaneityPropertiesSummary        +---------+---------------+---------+-----------+----------+--------------+ CFV      Full           Yes      Yes                                 +---------+---------------+---------+-----------+----------+--------------+ SFJ      Full                                                         +---------+---------------+---------+-----------+----------+--------------+ FV Prox  Full                                                        +---------+---------------+---------+-----------+----------+--------------+ FV Mid   Full                                                        +---------+---------------+---------+-----------+----------+--------------+ FV DistalFull                                                        +---------+---------------+---------+-----------+----------+--------------+  PFV      Full                                                        +---------+---------------+---------+-----------+----------+--------------+ POP      Full           Yes      Yes                                 +---------+---------------+---------+-----------+----------+--------------+ PTV      Full                                                        +---------+---------------+---------+-----------+----------+--------------+ PERO                                                  Not visualized +---------+---------------+---------+-----------+----------+--------------+    Summary: Right: There is no evidence of deep vein thrombosis in the lower extremity. No cystic structure found in the popliteal fossa. Left: There is no evidence of deep vein thrombosis in the lower extremity. No cystic structure found in the popliteal fossa.  *See table(s) above for measurements and observations. Electronically signed by Curt Jews MD on 02/10/2018 at 9:40:57 PM.    Final     Assessment and Plan:   1. Symptomatic bradycardia -Patient reports longstanding history of intermittent dizziness while standing up and walking.  She does have history of prior syncope with reassuring event monitor.  Echocardiogram with mild aortic stenosis February 2018.  She does have history of mild bilateral carotid artery disease. -Telemetry without  high-grade bradycardia.  She is sinus bradycardic in 40s. Pending echocardiogram to evaluate LVEF and valves. Not on any AV blocking agent.  - Given severely abnormal thyroid function status, her current presentation most likely related thyroid disease leading to  symptomatic bradycardia.  Recommended correcting TSH before pursuing further cardiac evaluation. -Once normal thyroid function test and continues to have ongoing dizziness, she will be considered for loop recorder implantation.  2. Mild aortic stenosis - Pending echo.  Her presentation does not seems valvular in etiology.  3. HTN - Elevated. Per primary team. Avoid AV blocking agent.   [facility] will sign off.   Medication Recommendations: As summarized above Other recommendations (labs, testing, etc): Pending echo Follow up as an outpatient: Routine follow-up with Dr. Marlou Porch  For questions or updates, please contact [facility] Please consult [email or web address] for contact info under     Jarrett Soho, [place]  02/11/2018 2:28 PM   Attending Note:   The patient was seen and examined.  Agree with assessment and plan as noted above.  Changes made to the above note as needed.  Patient seen and independently examined with Robbie Lis, PA .   We discussed all aspects of the encounter. I agree with the assessment and plan as stated above.  1.  Sinus bradycardia: The patient presents with symptoms that are fairly classic for hypothyroidism.  Found  to have a TSH of 87.  Her free T4 is low.  He has swelling in her legs which is consistent with myxedema. She is not had any significant pauses but is bradycardic. I think that her sinus bradycardia is almost certainly being caused/exacerbated by her hypothyroidism. No indication for pacemaker.  There apparently was also some concern for aortic stenosis.  The patient has mild aortic stenosis by echocardiogram in February, 2018.  Her exam is consistent with mild aortic  stenosis and is not at all consistent with severe aortic stenosis.   I would recommend increasing her thyroid replacement hormone. She can follow-up with Dr. Marlou Porch and Dr. Rayann Heman and in the office as scheduled.  2.  Symptomatic hypothyroidism.  The patient's symptoms are consistent with hypothyroidism.   She will need additional synthroid replacement    I have spent a total of 40 minutes with patient reviewing hospital  notes , telemetry, EKGs, labs and examining patient as well as establishing an assessment and plan that was discussed with the patient. > 50% of time was spent in direct patient care.   [facility] will sign off.   Medication Recommendations:  No change in cardiac meds, needs additional thyroid replacement  Other recommendations (labs, testing, etc):   Follow up as an outpatient:  With Skains and Allred as scheduled    Ramond Dial., MD, [facility] 02/11/2018, 3:10 PM [address]  [place] Pager [phone]

## 2018-02-11 NOTE — Progress Notes (Addendum)
PT Cancellation Note  Patient Details Name: [name] MRN: [id number] DOB: 03-18-1940   Cancelled Treatment:    Reason Eval/Treat Not Completed: Patient at procedure or test/unavailable. Currently getting echo. Will follow-up for PT evaluation as schedule permits.  Mabeline Caras, PT, DPT Acute Rehabilitation Services  Pager [phone] Office [place] 02/11/2018, 4:36 PM

## 2018-02-11 NOTE — Progress Notes (Signed)
PROGRESS NOTE    [name]  FVC:[id number] DOB: 1940-04-24 DOA: 02/10/2018 PCP: Delrae Rend, MD   Brief Narrative: [name] is a 78 y.o. female with medical history significant of DM2, HTN, hypothyroidism, CKD stage 3, mild AS. Patient presented secondary to near syncope and found to have significant hypothyroidism and bradycardia.   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 Incomplete orthostatic vitals obtained. History of mild aortic stenosis on Transthoracic Echocardiogram form 2018. -Continue telemetry -Transthoracic Echocardiogram pending  Symptomatic bradycardia Likely related to hypothyroidism. Associated near syncope. Sinus rhythm on EKG. Cardiology consulted on admission.  -Continue telemetry -Cardiology recommendations  Hypothyroidism S/p thyroidectomy for history of goiter back in the 1990s. Called patient's endocrinologist's office. Confirmed from patient's endocrinologist's office. TSH from 08/06/2017 of 0.51. -Increase to previous dose of Synthroid 200 mcg; will attempt to discuss with patient's endocrinologist prior to discharge for discharge dosing  Diabetes mellitus, type 2 -Continue SSI  Essential hypertension Slightly uncontrolled -Continue losartan  CKD stage III Stable.  Rheumatoid arthritis Outpatient follow-up -Continue home Norco  Hyperlipidemia -Continue Lipitor   DVT prophylaxis: Lovenox Code Status:   Code Status: Full Code Family Communication: Husband at bedside Disposition Plan: Discharge likely in 24 hours pending PT, cardiology recs   Consultants:   Cardiology  Procedures:   None    Antimicrobials:  None    Subjective: Feels cold. Weak. No other issues.  Objective: Vitals:   02/10/18 2119 02/10/18 2221 02/11/18 0500 02/11/18 1130  BP: (!) 163/53 (!) 173/51  (!) 156/51  Pulse: (!) 56 (!) 49 (!) 53 (!) 47  Resp: 16 12 11    Temp:  98.6 F (37 C) 98.9 F (37.2 C) 98.6 F (37 C)  TempSrc:  Oral Oral Oral  SpO2: 100% 100% 100%   Weight:  83.3 kg 83 kg   Height:  5\' 3"  (1.6 m)      Intake/Output Summary (Last 24 hours) at 02/11/2018 1257 Last data filed at 02/11/2018 0800 Gross per 24 hour  Intake 1240 ml  Output -  Net 1240 ml   Filed Weights   02/10/18 1527 02/10/18 2221 02/11/18 0500  Weight: 90.7 kg 83.3 kg 83 kg    Examination:  General exam: Appears calm and comfortable Respiratory system: Clear to auscultation. Respiratory effort normal. Cardiovascular system: S1 & S2 heard, slightly slow rate, normal rhythm. No murmurs, rubs, gallops or clicks. Gastrointestinal system: Abdomen is nondistended, soft and nontender. No organomegaly or masses felt. Normal bowel sounds heard. Central nervous system: Alert and oriented. No focal neurological deficits. Extremities: No edema. No calf tenderness Skin: No cyanosis. No rashes Psychiatry: Judgement and insight appear normal. Mood & affect appropriate.     Data Reviewed: I have personally reviewed following labs and imaging studies  CBC: Recent Labs  Lab 02/10/18 1827  WBC 5.9  NEUTROABS 3.4  HGB 13.8  HCT 42.3  MCV 88.3  PLT 638   Basic Metabolic Panel: Recent Labs  Lab 02/10/18 1827  NA 141  K 4.0  CL 104  CO2 27  GLUCOSE 123*  BUN 11  CREATININE 1.48*  CALCIUM 9.8  MG 2.0   GFR: Estimated Creatinine Clearance: 31.4 mL/min (  A) (by C-G formula based on SCr of 1.48 mg/dL (H)). Liver Function Tests: Recent Labs  Lab 02/10/18 1827  AST 21  ALT 10  ALKPHOS 68  BILITOT 0.6  PROT 7.7  ALBUMIN 4.2   Recent Labs  Lab 02/10/18 1827  LIPASE 28   Recent Labs  Lab  02/10/18 1827  AMMONIA 24   Coagulation Profile: Recent Labs  Lab 02/10/18 1827  INR 0.93   Cardiac Enzymes: No results for input(s): CKTOTAL, CKMB, CKMBINDEX, TROPONINI in the last 168 hours. BNP (last 3 results) No results for input(s): PROBNP in the last 8760 hours. HbA1C: No results for input(s): HGBA1C in the last 72 hours. CBG: Recent Labs  Lab 02/10/18 2229 02/11/18 0802 02/11/18 1137  GLUCAP 120* 101* 103*   Lipid Profile: No results for input(s): CHOL, HDL, LDLCALC, TRIG, CHOLHDL, LDLDIRECT in the last 72 hours. Thyroid Function Tests: Recent Labs    02/10/18 1827  TSH 87.941*  FREET4 0.24*   Anemia Panel: No results for input(s): VITAMINB12, FOLATE, FERRITIN, TIBC, IRON, RETICCTPCT in the last 72 hours. Sepsis Labs: Recent Labs  Lab 02/10/18 1827  LATICACIDVEN 1.6    No results found for this or any previous visit (from the past 240 hour(s)).       Radiology Studies: Dg Chest 2 View  Result Date: 02/10/2018 CLINICAL DATA:  Sudden onset dizziness, headache, shortness of breath nausea at 1300 hours today. EXAM: CHEST - 2 VIEW COMPARISON:  09/02/2017. FINDINGS: Normal sized heart. Stable minimal linear scarring in the left lower lung zone. Otherwise, clear lungs. 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 IMPRESSION: No acute abnormality. Electronically Signed   By: Claudie Revering M.D.   On: 02/10/2018 17:47   Ct Head Wo Contrast  Result Date: 02/10/2018 CLINICAL DATA:  Sudden onset dizziness, headache, shortness of breath and nausea at 1300 hours today. EXAM: CT HEAD WITHOUT CONTRAST TECHNIQUE: Contiguous axial images were obtained from the base of the skull through the vertex without intravenous contrast. COMPARISON:  02/27/2016. FINDINGS: Brain: Stable mildly enlarged ventricles and cortical sulci. Mildly progressive patchy white matter low density in both cerebral hemispheres. No intracranial hemorrhage, mass lesion or CT evidence of acute  infarction. Vascular: No hyperdense vessel or unexpected calcification. Skull: Stable post right frontotemporal craniotomy changes. Mild bilateral hyperostosis frontalis. Sinuses/Orbits: Status post bilateral cataract extraction. Unremarkable included paranasal sinuses. Other: None. IMPRESSION: 1. No acute abnormality. 2. Mildly progressive mild chronic small vessel white matter ischemic changes in both cerebral hemispheres. 3. Stable mild diffuse cerebral and cerebellar atrophy. Electronically Signed   By: Claudie Revering M.D.   On: 02/10/2018 17:46   Vas [place] Lower Extremity Venous (dvt) (only Mc & Wl)  Result Date: 02/10/2018  Lower Venous Study Indications: Swelling, and Edema.  Limitations: Body habitus. Performing Technologist: Abram Sander RVS  Examination Guidelines: A complete evaluation includes B-mode imaging, spectral Doppler, color Doppler, and power Doppler as needed of all accessible portions of each vessel. Bilateral testing is considered an integral part of a complete examination. Limited examinations for reoccurring indications may be performed as noted.  Right Venous Findings: +---------+---------------+---------+-----------+----------+--------------+          CompressibilityPhasicitySpontaneityPropertiesSummary        +---------+---------------+---------+-----------+----------+--------------+ CFV      Full           Yes      Yes                                 +---------+---------------+---------+-----------+----------+--------------+  SFJ      Full                                                        +---------+---------------+---------+-----------+----------+--------------+ FV Prox  Full                                                        +---------+---------------+---------+-----------+----------+--------------+ FV Mid   Full                                                        +---------+---------------+---------+-----------+----------+--------------+  FV DistalFull                                                        +---------+---------------+---------+-----------+----------+--------------+ PFV      Full                                                        +---------+---------------+---------+-----------+----------+--------------+ POP      Full           Yes      Yes                                 +---------+---------------+---------+-----------+----------+--------------+ PTV                                                   Not visualized +---------+---------------+---------+-----------+----------+--------------+ PERO                                                  Not visualized +---------+---------------+---------+-----------+----------+--------------+  Left Venous Findings: +---------+---------------+---------+-----------+----------+--------------+          CompressibilityPhasicitySpontaneityPropertiesSummary        +---------+---------------+---------+-----------+----------+--------------+ CFV      Full           Yes      Yes                                 +---------+---------------+---------+-----------+----------+--------------+ SFJ      Full                                                        +---------+---------------+---------+-----------+----------+--------------+  FV Prox  Full                                                        +---------+---------------+---------+-----------+----------+--------------+ FV Mid   Full                                                        +---------+---------------+---------+-----------+----------+--------------+ FV DistalFull                                                        +---------+---------------+---------+-----------+----------+--------------+ PFV      Full                                                        +---------+---------------+---------+-----------+----------+--------------+ POP      Full            Yes      Yes                                 +---------+---------------+---------+-----------+----------+--------------+ PTV      Full                                                        +---------+---------------+---------+-----------+----------+--------------+ PERO                                                  Not visualized +---------+---------------+---------+-----------+----------+--------------+    Summary: Right: There is no evidence of deep vein thrombosis in the lower extremity. No cystic structure found in the popliteal fossa. Left: There is no evidence of deep vein thrombosis in the lower extremity. No cystic structure found in the popliteal fossa.  *See table(s) above for measurements and observations. Electronically signed by Curt Jews MD on 02/10/2018 at 9:40:57 PM.    Final         Scheduled Meds: . allopurinol  300 mg Oral Daily  . aspirin EC  81 mg Oral Daily  . atorvastatin  80 mg Oral q1800  . cholecalciferol  1,000 Units Oral Daily  . enoxaparin (LOVENOX) injection  40 mg Subcutaneous Q24H  . ferrous sulfate  325 mg Oral Q breakfast  . folic acid  1 mg Oral Daily  . insulin aspart  0-5 Units Subcutaneous QHS  . insulin aspart  0-9 Units Subcutaneous TID WC  . levothyroxine  100 mcg Intravenous Daily  . losartan  50 mg Oral QPM  . sodium chloride flush  3 mL Intravenous  Q12H   Continuous Infusions:   LOS: 0 days     Cordelia Poche, MD [facility] 02/11/2018, 12:57 PM  If 7PM-7AM, please contact night-coverage [email or web address]

## 2018-02-11 NOTE — Progress Notes (Signed)
  Echocardiogram 2D Echocardiogram has been performed.  [name] 02/11/2018, 5:05 PM

## 2018-02-12 DIAGNOSIS — E039 Hypothyroidism, unspecified: Secondary | ICD-10-CM | POA: Diagnosis not present

## 2018-02-12 DIAGNOSIS — I951 Orthostatic hypotension: Secondary | ICD-10-CM

## 2018-02-12 DIAGNOSIS — N183 Chronic kidney disease, stage 3 (moderate): Secondary | ICD-10-CM | POA: Diagnosis not present

## 2018-02-12 DIAGNOSIS — E119 Type 2 diabetes mellitus without complications: Secondary | ICD-10-CM | POA: Diagnosis not present

## 2018-02-12 DIAGNOSIS — I10 Essential (primary) hypertension: Secondary | ICD-10-CM | POA: Diagnosis not present

## 2018-02-12 DIAGNOSIS — Z794 Long term (current) use of insulin: Secondary | ICD-10-CM | POA: Diagnosis not present

## 2018-02-12 DIAGNOSIS — R55 Syncope and collapse: Secondary | ICD-10-CM

## 2018-02-12 DIAGNOSIS — E890 Postprocedural hypothyroidism: Secondary | ICD-10-CM | POA: Diagnosis not present

## 2018-02-12 DIAGNOSIS — R001 Bradycardia, unspecified: Secondary | ICD-10-CM | POA: Diagnosis not present

## 2018-02-12 LAB — GLUCOSE, CAPILLARY
Glucose-Capillary: 104 mg/dL — ABNORMAL HIGH (ref 70–99)
Glucose-Capillary: 128 mg/dL — ABNORMAL HIGH (ref 70–99)

## 2018-02-12 MED ORDER — LEVOTHYROXINE SODIUM 200 MCG PO TABS: 200.0000 ug | ORAL_TABLET | Freq: Every day | ORAL | 0 refills | Status: DC

## 2018-02-12 NOTE — Progress Notes (Signed)
Discharge instructions reviewed with pt. Pt has no questions at this time. Pt states she dose not want home health pt at this time because she is in the process of a move. Pt's husband at bedside. Pt states she can drive and will call to schedule her follow up appointment. IV D/C.

## 2018-02-12 NOTE — Discharge Instructions (Addendum)
[name],  You were in the hospital because you almost passed out. This is likely secondary to blood pressure drops because of your slow heart rate. All of this is likely related to your hypothyroidism. Your Synthroid has been adjusted. Please follow-up with your PCP and your endocrinologist. I have discontinued your losartan because of your blood pressure drops. This will be temporary as your heart rate improves with time. Please discuss resumption of losartan with your primary care physician.

## 2018-02-12 NOTE — Progress Notes (Signed)
Progress Note  Patient Name: [name] Date of Encounter: 02/12/2018  Primary Cardiologist: Candee Furbish, MD  Subjective   78 year old female with a history of aortic stenosis, hypothyroidism who is admitted with profound fatigue.  She was found to have a markedly elevated TSH. Bradycardia.  She denies any syncope or presyncope. She is feeling quite a bit better today. Echocardiogram reveals slight worsening of her aortic stenosis.  She probably has moderate aortic stenosis.  I have personally reviewed the echocardiogram and do not think that there is low gradient/low output severe aortic stenosis. None of her symptoms are consistent with aortic stenosis and her exam is not consistent with severe aortic stenosis.  Inpatient Medications    Scheduled Meds: . allopurinol  300 mg Oral Daily  . aspirin EC  81 mg Oral Daily  . atorvastatin  80 mg Oral q1800  . cholecalciferol  1,000 Units Oral Daily  . enoxaparin (LOVENOX) injection  40 mg Subcutaneous Q24H  . ferrous sulfate  325 mg Oral Q breakfast  . folic acid  1 mg Oral Daily  . insulin aspart  0-5 Units Subcutaneous QHS  . insulin aspart  0-9 Units Subcutaneous TID WC  . levothyroxine  200 mcg Oral Q0600  . losartan  50 mg Oral QPM  . sodium chloride flush  3 mL Intravenous Q12H   Continuous Infusions:  PRN Meds: HYDROcodone-acetaminophen, pregabalin   Vital Signs    Vitals:   02/11/18 1647 02/11/18 2033 02/12/18 0613 02/12/18 0845  BP: (!) 150/59 (!) 154/56 (!) 140/59 (!) 136/51  Pulse: (!) 48 (!) 45 (!) 45 (!) 43  Resp: 13 18    Temp: 99 F (37.2 C) 97.8 F (36.6 C) 98.4 F (36.9 C) 98 F (36.7 C)  TempSrc: Oral  Oral Oral  SpO2: 99% 99% 95% 99%  Weight:      Height:        Intake/Output Summary (Last 24 hours) at 02/12/2018 0954 Last data filed at 02/12/2018 0348 Gross per 24 hour  Intake 1047 ml  Output 1100 ml  Net -53 ml   Last 3 Weights 02/11/2018 02/10/2018 02/10/2018  Weight (lbs) 183 lb 183 lb  9.6 oz 200 lb  Weight (kg) 83.008 kg 83.28 kg 90.719 kg      Telemetry    Sinus brady  - Personally Reviewed  ECG     sinus brady  - Personally Reviewed  Physical Exam   GEN: No acute distress.   Neck: No JVD Cardiac:  Regular rate, 2/6 systolic ejection murmur at the left sternal border Respiratory: Clear to auscultation bilaterally. GI: Soft, nontender, non-distended  MS: No edema; No deformity. Neuro:  Nonfocal  Psych: Normal affect   Labs    Chemistry Recent Labs  Lab 02/10/18 1827  NA 141  K 4.0  CL 104  CO2 27  GLUCOSE 123*  BUN 11  CREATININE 1.48*  CALCIUM 9.8  PROT 7.7  ALBUMIN 4.2  AST 21  ALT 10  ALKPHOS 68  BILITOT 0.6  GFRNONAA 33*  GFRAA 39*  ANIONGAP 10     Hematology Recent Labs  Lab 02/10/18 1827  WBC 5.9  RBC 4.79  HGB 13.8  HCT 42.3  MCV 88.3  MCH 28.8  MCHC 32.6  RDW 15.9*  PLT 230    Cardiac EnzymesNo results for input(s): TROPONINI in the last 168 hours.  Recent Labs  Lab 02/10/18 1835 02/10/18 2128  TROPIPOC 0.01 0.00     BNP Recent  Labs  Lab 02/10/18 1827  BNP 33.3     DDimer No results for input(s): DDIMER in the last 168 hours.   Radiology    Dg Chest 2 View  Result Date: 02/10/2018 CLINICAL DATA:  Sudden onset dizziness, headache, shortness of breath nausea at 1300 hours today. EXAM: CHEST - 2 VIEW COMPARISON:  09/02/2017. FINDINGS: Normal sized heart. Stable minimal linear scarring in the left lower lung zone. Otherwise, clear lungs. 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 IMPRESSION: No acute abnormality. Electronically Signed   By: Claudie Revering M.D.   On: 02/10/2018 17:47   Ct Head Wo Contrast  Result Date: 02/10/2018 CLINICAL DATA:  Sudden onset dizziness, headache, shortness of breath and nausea at 1300 hours today. EXAM: CT HEAD WITHOUT CONTRAST TECHNIQUE: Contiguous axial images were obtained from the base of the skull through the vertex without intravenous contrast. COMPARISON:   02/27/2016. FINDINGS: Brain: Stable mildly enlarged ventricles and cortical sulci. Mildly progressive patchy white matter low density in both cerebral hemispheres. No intracranial hemorrhage, mass lesion or CT evidence of acute infarction. Vascular: No hyperdense vessel or unexpected calcification. Skull: Stable post right frontotemporal craniotomy changes. Mild bilateral hyperostosis frontalis. Sinuses/Orbits: Status post bilateral cataract extraction. Unremarkable included paranasal sinuses. Other: None. IMPRESSION: 1. No acute abnormality. 2. Mildly progressive mild chronic small vessel white matter ischemic changes in both cerebral hemispheres. 3. Stable mild diffuse cerebral and cerebellar atrophy. Electronically Signed   By: Claudie Revering M.D.   On: 02/10/2018 17:46   Vas [place] Lower Extremity Venous (dvt) (only Mc & Wl)  Result Date: 02/10/2018  Lower Venous Study Indications: Swelling, and Edema.  Limitations: Body habitus. Performing Technologist: Abram Sander RVS  Examination Guidelines: A complete evaluation includes B-mode imaging, spectral Doppler, color Doppler, and power Doppler as needed of all accessible portions of each vessel. Bilateral testing is considered an integral part of a complete examination. Limited examinations for reoccurring indications may be performed as noted.  Right Venous Findings: +---------+---------------+---------+-----------+----------+--------------+          CompressibilityPhasicitySpontaneityPropertiesSummary        +---------+---------------+---------+-----------+----------+--------------+ CFV      Full           Yes      Yes                                 +---------+---------------+---------+-----------+----------+--------------+ SFJ      Full                                                        +---------+---------------+---------+-----------+----------+--------------+ FV Prox  Full                                                         +---------+---------------+---------+-----------+----------+--------------+ FV Mid   Full                                                        +---------+---------------+---------+-----------+----------+--------------+  FV DistalFull                                                        +---------+---------------+---------+-----------+----------+--------------+ PFV      Full                                                        +---------+---------------+---------+-----------+----------+--------------+ POP      Full           Yes      Yes                                 +---------+---------------+---------+-----------+----------+--------------+ PTV                                                   Not visualized +---------+---------------+---------+-----------+----------+--------------+ PERO                                                  Not visualized +---------+---------------+---------+-----------+----------+--------------+  Left Venous Findings: +---------+---------------+---------+-----------+----------+--------------+          CompressibilityPhasicitySpontaneityPropertiesSummary        +---------+---------------+---------+-----------+----------+--------------+ CFV      Full           Yes      Yes                                 +---------+---------------+---------+-----------+----------+--------------+ SFJ      Full                                                        +---------+---------------+---------+-----------+----------+--------------+ FV Prox  Full                                                        +---------+---------------+---------+-----------+----------+--------------+ FV Mid   Full                                                        +---------+---------------+---------+-----------+----------+--------------+ FV DistalFull                                                         +---------+---------------+---------+-----------+----------+--------------+  PFV      Full                                                        +---------+---------------+---------+-----------+----------+--------------+ POP      Full           Yes      Yes                                 +---------+---------------+---------+-----------+----------+--------------+ PTV      Full                                                        +---------+---------------+---------+-----------+----------+--------------+ PERO                                                  Not visualized +---------+---------------+---------+-----------+----------+--------------+    Summary: Right: There is no evidence of deep vein thrombosis in the lower extremity. No cystic structure found in the popliteal fossa. Left: There is no evidence of deep vein thrombosis in the lower extremity. No cystic structure found in the popliteal fossa.  *See table(s) above for measurements and observations. Electronically signed by Curt Jews MD on 02/10/2018 at 9:40:57 PM.    Final     Cardiac Studies     Patient Profile     78 y.o. female admitted with weakness and found to have a TSH of 87.  She incidentally was found to have sinus bradycardia.  She has probably moderate aortic stenosis  Assessment & Plan    .  Sinus bradycardia: The patient has not had any syncope or presyncope.  There is no indication for pacemaker. The bradycardia is due to her hypothyroidism.  She will follow-up with Dr. Marlou Porch for further cardiology issues.  .  Aortic stenosis: Her aortic valve gnosis has worsened slightly.  None of her symptoms are consistent with aortic stenosis.  Her exam is not consistent with aortic stenosis.  This will be followed by Dr. Marlou Porch.   [facility] will sign off.   Medication Recommendations:  Cont current meds  Other recommendations (labs, testing, etc):   Follow up as an outpatient:  With Dr. Marlou Porch     For questions or updates, please contact [place] Please consult [email or web address] for contact info under        Signed, Mertie Moores, MD  02/12/2018, 9:54 AM

## 2018-02-12 NOTE — Evaluation (Addendum)
Physical Therapy Evaluation Patient Details Name: ELSBETH YEARICK MRN: [id number] DOB: [birth year] Today's Date: 02/12/2018   History of Present Illness  Pt is a 78 y.o. female admitted 02/10/18 with symptomatic bradycardia; likely secondary to hypothyroidism. PMH includes HTN, CHF, aortic stenosis, CKD, peripheral neuropathy, RA, TKA.    Clinical Impression  Pt presents with an overall decrease in functional mobility secondary to above. PTA, pt mod indep with SPC/RW, drives and lives with husband; has supportive family nearby. Today, pt able to ambulate with RW and supervision. Asymptomatic during session (see vitals below). Educ on orthostatic hypotension signs/symptoms and safety with mobility. Pt would benefit from continued acute PT services to maximize functional mobility and independence prior to d/c with HHPT.  Orthostatic BPs  Supine BP 147/99 HR 46  Sitting BP 133/76 HR 63  Standing BP 90/65 HR 68  Standing after 2 min BP 116/72  Post-mobility BP 136/57        Follow Up Recommendations Home health PT;Supervision for mobility/OOB    Equipment Recommendations  None recommended by PT    Recommendations for Other Services       Precautions / Restrictions Precautions Precautions: Fall Restrictions Weight Bearing Restrictions: No      Mobility  Bed Mobility Overal bed mobility: Modified Independent             General bed mobility comments: HOB slightly elevated; increased effort  Transfers Overall transfer level: Modified independent Equipment used: Rolling walker (2 wheeled)             General transfer comment: Reliant on momentum to power into standing  Ambulation/Gait Ambulation/Gait assistance: Supervision Gait Distance (Feet): 5 Feet Assistive device: Rolling walker (2 wheeled) Gait Pattern/deviations: Step-through pattern;Decreased stride length;Wide base of support Gait velocity: Decreased   General Gait Details: Pt declined further  ambulation distance secondary to prolonged standing for orthostatic BP vitals; c/o L knee pain  Stairs            Wheelchair Mobility    Modified Rankin (Stroke Patients Only)       Balance Overall balance assessment: Needs assistance   Sitting balance-Leahy Scale: Good       Standing balance-Leahy Scale: Fair Standing balance comment: Can static stand without UE support                             Pertinent Vitals/Pain Pain Assessment: Faces Faces Pain Scale: Hurts a little bit Pain Location: L knee Pain Descriptors / Indicators: Sore Pain Intervention(s): Limited activity within patient's tolerance    Home Living Family/patient expects to be discharged to:: Private residence Living Arrangements: Spouse/significant other Available Help at Discharge: Family;Available 24 hours/day Type of Home: House Home Access: Stairs to enter     Home Layout: Two level;Able to live on main level with bedroom/bathroom Home Equipment: Gilford Rile - 2 wheels;Cane - single point;Bedside commode      Prior Function Level of Independence: Independent with assistive device(s)         Comments: Uses RW or SPC as needed, mostly when rheumatoid arthritis acting up. Drives     Hand Dominance   Dominant Hand: Right    Extremity/Trunk Assessment   Upper Extremity Assessment Upper Extremity Assessment: Overall WFL for tasks assessed    Lower Extremity Assessment Lower Extremity Assessment: Overall WFL for tasks assessed       Communication   Communication: No difficulties  Cognition Arousal/Alertness: Awake/alert Behavior During Therapy: [facility]  for tasks assessed/performed Overall Cognitive Status: Within Functional Limits for tasks assessed                                        General Comments General comments (skin integrity, edema, etc.): Educ on orthostatic hypotension signs/symptoms and intervention    Exercises     Assessment/Plan     PT Assessment Patient needs continued PT services  PT Problem List Decreased activity tolerance;Decreased balance;Decreased mobility;Decreased knowledge of use of DME;Cardiopulmonary status limiting activity       PT Treatment Interventions DME instruction;Gait training;Stair training;Functional mobility training;Therapeutic activities;Therapeutic exercise;Balance training;Patient/family education    PT Goals (Current goals can be found in the Care Plan section)  Acute Rehab PT Goals Patient Stated Goal: Return home PT Goal Formulation: With patient Time For Goal Achievement: 02/26/18 Potential to Achieve Goals: Good    Frequency Min 3X/week   Barriers to discharge        Co-evaluation               AM-PAC PT "6 Clicks" Mobility  Outcome Measure Help needed turning from your back to your side while in a flat bed without using bedrails?: None Help needed moving from lying on your back to sitting on the side of a flat bed without using bedrails?: None Help needed moving to and from a bed to a chair (including a wheelchair)?: None Help needed standing up from a chair using your arms (e.g., wheelchair or bedside chair)?: A Little Help needed to walk in hospital room?: A Little Help needed climbing 3-5 steps with a railing? : A Little 6 Click Score: 21    End of Session Equipment Utilized During Treatment: Gait belt Activity Tolerance: Patient tolerated treatment well Patient left: in chair;with call bell/phone within reach;with chair alarm set Nurse Communication: Mobility status PT Visit Diagnosis: Other abnormalities of gait and mobility (R26.89);Muscle weakness (generalized) (M62.81)    Time: 6659-9357 PT Time Calculation (min) (ACUTE ONLY): 22 min   Charges:   PT Evaluation $PT Eval Moderate Complexity: 1 Mod        Mabeline Caras, PT, DPT Acute Rehabilitation Services  Pager [phone] Office [phone]  Derry Lory 02/12/2018, 10:23 AM

## 2018-02-12 NOTE — Discharge Summary (Signed)
Physician Discharge Summary  [name] WLN:[id number] DOB: 1940-06-11 DOA: 02/10/2018  PCP: Delrae Rend, MD  Admit date: 02/10/2018 Discharge date: 02/12/2018  Admitted From: Home Disposition: Home  Recommendations for Outpatient Follow-up:  1. Follow up with PCP in 1 week 2. Follow up with endocrinologist in 1 month 3. Recheck TSH in 4-6 weeks/endocrine follow-up 4. Consider restarting losartan if orthostatic blood pressure improved 5. Please obtain BMP/CBC in one week 6. Please follow up on the following pending results: None  Home Health: None Equipment/Devices: None  Discharge Condition: Stable CODE STATUS: Full code Diet recommendation: Heart healthy   Brief/Interim Summary:  Admission HPI written by Etta Quill, DO   Chief Complaint: Dizziness, SOB  HPI: [name] is a 78 y.o. female with medical history significant of DM2, HTN, hypothyroidism, CKD stage 3, mild AS.  Patient presents to ED with several days of fatigue, 3 hours of SOB, palpitations, headache, near syncope, chills, malaise.  She reports that today at 1:00 she started having shortness of breath and headache. She reports the headache was moderate to severe. She denies any vision changes but did report some nausea. No vomiting. She denies recent trauma. She denies chest pain or abdominal pain. She denies significant cough or congestion. She reports no urinary symptoms. She says that her mouth is been very dry and she feels dehydrated.  She reports that today she calledEMSwhen she felt like she was getting ready to pass out. She reports he was having palpitations and felt like her heart was racing. EMS was called and they found her to be bradycardic with a rate in the 40s. Patient was given atropine as she was looking pale and clammy. Patient also given some fluids. Heart rate improved. Patient was feeling somewhat better upon arrival to the emergency  department.   Hospital course:  Symptomatic bradycardia Likely related to hypothyroidism. Associated near syncope. Sinus rhythm on EKG. Cardiology consulted on admission. No role for pacemaker. Will likely improve with treatment of hypothyroidism.   Near syncope Likely related to bradycardia. History of mild aortic stenosis on Transthoracic Echocardiogram form 2018. Telemetry significant for sinus bradycardia. Transthoracic Echocardiogram significant for worsened aortic stenosis. Orthostatic vitals positive.  Hypothyroidism S/p thyroidectomy for history of goiter back in the 1990s. Patient has been on Synthroid 175 mcg since June 2019. TSH of 87.9 this admission. Synthroid increased to 200 mcg daily. Outpatient endocrinology follow-up recommended.  Diabetes mellitus, type 2 Diet control  Essential hypertension Slightly uncontrolled. Losartan held secondary to orthostatic hypotension.  Orthostatic hypotension Likely contributed to s  CKD stage III Stable.  Rheumatoid arthritis Outpatient follow-up. Continue home Norco  Hyperlipidemia Continue Lipitor  Aortic stenosis Worsened on recent Transthoracic Echocardiogram. Cardiology recommending outpatient follow-up with Dr. 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 (HCC)   Hypothyroidism   Diabetes mellitus, type II, insulin dependent (HCC)   CKD (chronic kidney disease) stage 3, GFR 30-59 ml/min [facility])    Discharge Instructions  Discharge Instructions    Call MD for:  difficulty breathing, headache or visual disturbances   Complete by:  As directed    Call MD for:  extreme fatigue   Complete by:  As directed    Call MD for:  persistant dizziness or light-headedness   Complete by:  As directed    Diet - low sodium heart healthy   Complete  by:  As directed     Increase activity slowly   Complete by:  As directed      Allergies as of 02/12/2018      Reactions   Methotrexate Derivatives Other (See Comments)   Elevated creat. Levels   Other Other (See Comments)   NO Blood products.  [name]- causes low blood sugar (pt states she was taking medication but cant remember what it was for.)   Penicillins Rash   Has patient had a PCN reaction causing immediate rash, facial/tongue/throat swelling, SOB or lightheadedness with hypotension: Yes Has patient had a PCN reaction causing severe rash involving mucus membranes or skin necrosis: Yes  Has patient had a PCN reaction that required hospitalization No Has patient had a PCN reaction occurring within the last 10 years: No If all of the above answers are "NO", then may proceed with Cephalosporin use.   Remicade [infliximab] Other (See Comments)   Inadequate response   Zocor [simvastatin] Other (See Comments)   Leg cramps      Medication List    STOP taking these medications   losartan 50 MG tablet Commonly known as:  COZAAR     TAKE these medications   acetaminophen 500 MG tablet Commonly known as:  TYLENOL Take 1,000 mg by mouth every 6 (six) hours as needed for mild pain.   allopurinol 300 MG tablet Commonly known as:  ZYLOPRIM Take 1 tablet (300 mg total) by mouth daily.   aspirin 81 MG EC tablet Take 1 tablet (81 mg total) by mouth daily.   atorvastatin 80 MG tablet Commonly known as:  LIPITOR Take 80 mg by mouth daily at 6 PM.   BD INSULIN SYRINGE ULTRAFINE 31G X 15/64" 0.3 ML Misc Generic drug:  Insulin Syringe-Needle U-100   cholecalciferol 1000 units tablet Commonly known as:  VITAMIN D Take 1,000 Units by mouth daily.   clotrimazole-betamethasone cream Commonly known as:  LOTRISONE Apply 1 application topically as needed.   cyclobenzaprine 10 MG tablet Commonly known as:  FLEXERIL Take 10 mg by mouth daily as needed for muscle spasms.   ferrous sulfate 75 (15  Fe) MG/ML Soln Commonly known as:  FER-IN-SOL Take 45 mg of iron by mouth daily.   folic acid 1 MG tablet Commonly known as:  FOLVITE Take 1 mg by mouth daily.   HYDROcodone-acetaminophen 5-325 MG tablet Commonly known as:  NORCO Take 1 tablet by mouth every 6 (six) hours as needed for moderate pain. What changed:    when to take this  reasons to take this   levothyroxine 200 MCG tablet Commonly known as:  SYNTHROID, LEVOTHROID Take 1 tablet (200 mcg total) by mouth daily at 6 (six) AM. Start taking on:  February 13, 2018 What changed:    medication strength  how much to take  when to take this   ORENCIA IV Inject 750 mg into the vein See admin instructions. Abatacept (ORENCIA) 750 mg in sodium chloride 0.9 % 100 mL IVPB every 28 days   pregabalin 75 MG capsule Commonly known as:  LYRICA 1 bid What changed:    how much to take  how to take this  when to take this  additional instructions   pyridOXINE 100 MG tablet Commonly known as:  VITAMIN B-6 Take 100 mg by mouth daily.   VITAMIN E PO Take 1 capsule by mouth daily.      Follow-up Information    Jonathon Jordan, MD. Schedule an appointment as soon as  possible for a visit in 1 week(s).   Specialty:  Family Medicine Contact information: [place] [address] [phone]        Delrae Rend, MD. Schedule an appointment as soon as possible for a visit in 1 month(s).   Specialty:  Endocrinology Contact information: [address] [place] [address] [phone]          Allergies  Allergen Reactions  . Methotrexate Derivatives Other (See Comments)    Elevated creat. Levels  . Other Other (See Comments)    NO Blood products.  [name]- causes low blood sugar (pt states she was taking medication but cant remember what it was for.)  . Penicillins Rash    Has patient had a PCN reaction causing immediate rash, facial/tongue/throat  swelling, SOB or lightheadedness with hypotension: Yes Has patient had a PCN reaction causing severe rash involving mucus membranes or skin necrosis: Yes  Has patient had a PCN reaction that required hospitalization No Has patient had a PCN reaction occurring within the last 10 years: No If all of the above answers are "NO", then may proceed with Cephalosporin use.   . Remicade [Infliximab] Other (See Comments)    Inadequate response   . Zocor [Simvastatin] Other (See Comments)    Leg cramps     Consultations:  Cardiology   Procedures/Studies: Dg Chest 2 View  Result Date: 02/10/2018 CLINICAL DATA:  Sudden onset dizziness, headache, shortness of breath nausea at 1300 hours today. EXAM: CHEST - 2 VIEW COMPARISON:  09/02/2017. FINDINGS: Normal sized heart. Stable minimal linear scarring in the left lower lung zone. Otherwise, clear lungs. 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 IMPRESSION: No acute abnormality. Electronically Signed   By: Claudie Revering M.D.   On: 02/10/2018 17:47   Ct Head Wo Contrast  Result Date: 02/10/2018 CLINICAL DATA:  Sudden onset dizziness, headache, shortness of breath and nausea at 1300 hours today. EXAM: CT HEAD WITHOUT CONTRAST TECHNIQUE: Contiguous axial images were obtained from the base of the skull through the vertex without intravenous contrast. COMPARISON:  02/27/2016. FINDINGS: Brain: Stable mildly enlarged ventricles and cortical sulci. Mildly progressive patchy white matter low density in both cerebral hemispheres. No intracranial hemorrhage, mass lesion or CT evidence of acute infarction. Vascular: No hyperdense vessel or unexpected calcification. Skull: Stable post right frontotemporal craniotomy changes. Mild bilateral hyperostosis frontalis. Sinuses/Orbits: Status post bilateral cataract extraction. Unremarkable included paranasal sinuses. Other: None. IMPRESSION: 1. No acute abnormality. 2. Mildly progressive mild chronic small vessel  white matter ischemic changes in both cerebral hemispheres. 3. Stable mild diffuse cerebral and cerebellar atrophy. Electronically Signed   By: Claudie Revering M.D.   On: 02/10/2018 17:46   Vas [place] Lower Extremity Venous (dvt) (only [facility])  Result Date: 02/10/2018  Lower Venous Study Indications: Swelling, and Edema.  Limitations: Body habitus. Performing Technologist: Abram Sander RVS  Examination Guidelines: A complete evaluation includes B-mode imaging, spectral Doppler, color Doppler, and power Doppler as needed of all accessible portions of each vessel. Bilateral testing is considered an integral part of a complete examination. Limited examinations for reoccurring indications may be performed as noted.  Right Venous Findings: +---------+---------------+---------+-----------+----------+--------------+          CompressibilityPhasicitySpontaneityPropertiesSummary        +---------+---------------+---------+-----------+----------+--------------+ CFV      Full           Yes      Yes                                 +---------+---------------+---------+-----------+----------+--------------+  SFJ      Full                                                        +---------+---------------+---------+-----------+----------+--------------+ FV Prox  Full                                                        +---------+---------------+---------+-----------+----------+--------------+ FV Mid   Full                                                        +---------+---------------+---------+-----------+----------+--------------+ FV DistalFull                                                        +---------+---------------+---------+-----------+----------+--------------+ PFV      Full                                                        +---------+---------------+---------+-----------+----------+--------------+ POP      Full           Yes      Yes                                  +---------+---------------+---------+-----------+----------+--------------+ PTV                                                   Not visualized +---------+---------------+---------+-----------+----------+--------------+ PERO                                                  Not visualized +---------+---------------+---------+-----------+----------+--------------+  Left Venous Findings: +---------+---------------+---------+-----------+----------+--------------+          CompressibilityPhasicitySpontaneityPropertiesSummary        +---------+---------------+---------+-----------+----------+--------------+ CFV      Full           Yes      Yes                                 +---------+---------------+---------+-----------+----------+--------------+ SFJ      Full                                                        +---------+---------------+---------+-----------+----------+--------------+  FV Prox  Full                                                        +---------+---------------+---------+-----------+----------+--------------+ FV Mid   Full                                                        +---------+---------------+---------+-----------+----------+--------------+ FV DistalFull                                                        +---------+---------------+---------+-----------+----------+--------------+ PFV      Full                                                        +---------+---------------+---------+-----------+----------+--------------+ POP      Full           Yes      Yes                                 +---------+---------------+---------+-----------+----------+--------------+ PTV      Full                                                        +---------+---------------+---------+-----------+----------+--------------+ PERO                                                  Not visualized  +---------+---------------+---------+-----------+----------+--------------+    Summary: Right: There is no evidence of deep vein thrombosis in the lower extremity. No cystic structure found in the popliteal fossa. Left: There is no evidence of deep vein thrombosis in the lower extremity. No cystic structure found in the popliteal fossa.  *See table(s) above for measurements and observations. Electronically signed by Curt Jews MD on 02/10/2018 at 9:40:57 PM.    Final      Transthoracic Echocardiogram (02/11/2018) IMPRESSIONS    1. The left ventricle has normal systolic function of 32-20%. The cavity size was normal. There is no increased left ventricular wall thickness. Echo evidence of impaired diastolic relaxation.  2. The right ventricle has normal systolic function. The cavity was normal. There is no increase in right ventricular wall thickness.  3. Left atrial size was moderately dilated.  4. The mitral valve is normal in structure. There is mild calcification. No evidence of mitral valve stenosis. Trivial mitral regurgitation.  5. The tricuspid valve is normal in structure.  6. The aortic valve is tricuspid There is severe calcifcation of the  aortic valve. Aortic valve regurgitation is trivial by color flow Doppler. Aortic valve mean gradient 18 mmHg with AVA 0.8 cm^2. Possible low gradient severe aortic stenosis, aortic  valve needs further workup.  7. The aortic root and ascending aorta are normal in size and structure.  8. No evidence of left ventricular regional wall motion abnormalities.  9. The inferior vena cava is normal in size with greater than 50% respiratory variability. 10. No complete TR doppler jet so unable to estimate PA systolic pressure.   Subjective: Feels better. No specific concerns  Discharge Exam: Vitals:   02/12/18 1000 02/12/18 1300  BP: (!) 147/99 (!) 136/57  Pulse:  (!) 52  Resp: 12   Temp:  98.2 F (36.8 C)  SpO2:  99%   Vitals:   02/12/18 0613  02/12/18 0845 02/12/18 1000 02/12/18 1300  BP: (!) 140/59 (!) 136/51 (!) 147/99 (!) 136/57  Pulse: (!) 45 (!) 43  (!) 52  Resp:   12   Temp: 98.4 F (36.9 C) 98 F (36.7 C)  98.2 F (36.8 C)  TempSrc: Oral Oral  Oral  SpO2: 95% 99%  99%  Weight:      Height:        General: Pt is alert, awake, not in acute distress Cardiovascular: Slow rate, regular rhythm, S1/S2 +, no rubs, no gallops Respiratory: CTA bilaterally, no wheezing, no rhonchi Abdominal: Soft, NT, ND, bowel sounds + Extremities: Trace edema, no cyanosis    The results of significant diagnostics from this hospitalization (including imaging, microbiology, ancillary and laboratory) are listed below for reference.     Microbiology: No results found for this or any previous visit (from the past 240 hour(s)).   Labs: BNP (last 3 results) Recent Labs    02/10/18 1827  BNP 26.9   Basic Metabolic Panel: Recent Labs  Lab 02/10/18 1827  NA 141  K 4.0  CL 104  CO2 27  GLUCOSE 123*  BUN 11  CREATININE 1.48*  CALCIUM 9.8  MG 2.0   Liver Function Tests: Recent Labs  Lab 02/10/18 1827  AST 21  ALT 10  ALKPHOS 68  BILITOT 0.6  PROT 7.7  ALBUMIN 4.2   Recent Labs  Lab 02/10/18 1827  LIPASE 28   Recent Labs  Lab 02/10/18 1827  AMMONIA 24   CBC: Recent Labs  Lab 02/10/18 1827  WBC 5.9  NEUTROABS 3.4  HGB 13.8  HCT 42.3  MCV 88.3  PLT 230   Cardiac Enzymes: No results for input(s): CKTOTAL, CKMB, CKMBINDEX, TROPONINI in the last 168 hours. BNP: Invalid input(s): POCBNP CBG: Recent Labs  Lab 02/11/18 0802 02/11/18 1137 02/11/18 1646 02/11/18 2144 02/12/18 0619  GLUCAP 101* 103* 136* 124* 104*   D-Dimer No results for input(s): DDIMER in the last 72 hours. Hgb A1c No results for input(s): HGBA1C in the last 72 hours. Lipid Profile No results for input(s): CHOL, HDL, LDLCALC, TRIG, CHOLHDL, LDLDIRECT in the last 72 hours. Thyroid function studies Recent Labs    02/10/18 1827    TSH 87.941*   Anemia work up No results for input(s): VITAMINB12, FOLATE, FERRITIN, TIBC, IRON, RETICCTPCT in the last 72 hours. Urinalysis    Component Value Date/Time   COLORURINE YELLOW 02/11/2018 1830   APPEARANCEUR CLEAR 02/11/2018 1830   LABSPEC 1.011 02/11/2018 1830   LABSPEC 1.030 01/10/2010 1424   PHURINE 5.0 02/11/2018 1830   GLUCOSEU NEGATIVE 02/11/2018 1830   HGBUR NEGATIVE 02/11/2018 [place] NEGATIVE 02/11/2018 1830  BILIRUBINUR not done 01/10/2010 [place] 02/11/2018 1830   PROTEINUR NEGATIVE 02/11/2018 1830   UROBILINOGEN 0.2 09/20/2014 1643   NITRITE NEGATIVE 02/11/2018 [place] 02/11/2018 1830   LEUKOCYTESUR Large 01/10/2010 1424     SIGNED:   Cordelia Poche, MD [facility] 02/12/2018, 1:42 PM

## 2018-02-13 LAB — URINE CULTURE

## 2018-02-16 DIAGNOSIS — G894 Chronic pain syndrome: Secondary | ICD-10-CM | POA: Diagnosis not present

## 2018-02-16 DIAGNOSIS — E039 Hypothyroidism, unspecified: Secondary | ICD-10-CM | POA: Diagnosis not present

## 2018-02-16 DIAGNOSIS — D649 Anemia, unspecified: Secondary | ICD-10-CM | POA: Diagnosis not present

## 2018-02-16 DIAGNOSIS — R55 Syncope and collapse: Secondary | ICD-10-CM | POA: Diagnosis not present

## 2018-02-16 DIAGNOSIS — M109 Gout, unspecified: Secondary | ICD-10-CM | POA: Diagnosis not present

## 2018-03-01 ENCOUNTER — Other Ambulatory Visit: Payer: Self-pay | Admitting: *Deleted

## 2018-03-01 NOTE — Progress Notes (Signed)
Infusion orders are current for patient CBC CMP Tylenol Benadryl appointments are up to date and follow up appointment  is scheduled, yearly chest x-ray is not due yet.

## 2018-03-08 ENCOUNTER — Encounter (HOSPITAL_COMMUNITY)
Admission: RE | Admit: 2018-03-08 | Discharge: 2018-03-08 | Disposition: A | Discharge status: Home / Self Care | Payer: Medicare Other | Source: Ambulatory Visit | Attending: Rheumatology | Admitting: Rheumatology

## 2018-03-08 DIAGNOSIS — M0609 Rheumatoid arthritis without rheumatoid factor, multiple sites: Secondary | ICD-10-CM | POA: Diagnosis not present

## 2018-03-08 MED ORDER — DIPHENHYDRAMINE HCL 25 MG PO CAPS
ORAL_CAPSULE | ORAL | Status: AC
  Administered 2018-03-08: 25 mg via ORAL
  Filled 2018-03-08: qty 1

## 2018-03-08 MED ORDER — SODIUM CHLORIDE 0.9 % IV SOLN
750.0000 mg | INTRAVENOUS | Status: AC
  Administered 2018-03-08: 750 mg via INTRAVENOUS
  Filled 2018-03-08: qty 30

## 2018-03-08 MED ORDER — ACETAMINOPHEN 325 MG PO TABS: 650.0000 mg | ORAL_TABLET | ORAL | Status: DC

## 2018-03-08 MED ORDER — DIPHENHYDRAMINE HCL 25 MG PO CAPS
25.0000 mg | ORAL_CAPSULE | ORAL | Status: DC
  Administered 2018-03-08: 25 mg via ORAL

## 2018-03-12 DIAGNOSIS — E039 Hypothyroidism, unspecified: Secondary | ICD-10-CM | POA: Diagnosis not present

## 2018-03-12 DIAGNOSIS — I350 Nonrheumatic aortic (valve) stenosis: Secondary | ICD-10-CM | POA: Diagnosis not present

## 2018-03-12 DIAGNOSIS — J479 Bronchiectasis, uncomplicated: Secondary | ICD-10-CM | POA: Diagnosis not present

## 2018-03-12 DIAGNOSIS — E1142 Type 2 diabetes mellitus with diabetic polyneuropathy: Secondary | ICD-10-CM | POA: Diagnosis not present

## 2018-03-12 DIAGNOSIS — M4810 Ankylosing hyperostosis [Forestier], site unspecified: Secondary | ICD-10-CM | POA: Diagnosis not present

## 2018-03-12 DIAGNOSIS — M0570 Rheumatoid arthritis with rheumatoid factor of unspecified site without organ or systems involvement: Secondary | ICD-10-CM | POA: Diagnosis not present

## 2018-03-12 DIAGNOSIS — E559 Vitamin D deficiency, unspecified: Secondary | ICD-10-CM | POA: Diagnosis not present

## 2018-03-12 DIAGNOSIS — I6523 Occlusion and stenosis of bilateral carotid arteries: Secondary | ICD-10-CM | POA: Diagnosis not present

## 2018-03-12 DIAGNOSIS — D638 Anemia in other chronic diseases classified elsewhere: Secondary | ICD-10-CM | POA: Diagnosis not present

## 2018-03-12 DIAGNOSIS — M797 Fibromyalgia: Secondary | ICD-10-CM | POA: Diagnosis not present

## 2018-03-12 DIAGNOSIS — Z0000 Encounter for general adult medical examination without abnormal findings: Secondary | ICD-10-CM | POA: Diagnosis not present

## 2018-03-12 DIAGNOSIS — N183 Chronic kidney disease, stage 3 (moderate): Secondary | ICD-10-CM | POA: Diagnosis not present

## 2018-03-30 ENCOUNTER — Ambulatory Visit: Payer: Medicare Other | Admitting: Sports Medicine

## 2018-04-02 ENCOUNTER — Other Ambulatory Visit (HOSPITAL_COMMUNITY): Payer: Self-pay | Admitting: *Deleted

## 2018-04-05 ENCOUNTER — Inpatient Hospital Stay (HOSPITAL_COMMUNITY)
Admission: RE | Admit: 2018-04-05 | Discharge: 2018-04-05 | Disposition: A | Discharge status: Home / Self Care | Payer: Medicare Other | Source: Ambulatory Visit | Attending: Rheumatology | Admitting: Rheumatology

## 2018-04-05 NOTE — Discharge Instructions (Signed)
Denosumab injection

## 2018-04-21 ENCOUNTER — Other Ambulatory Visit: Payer: Self-pay | Admitting: *Deleted

## 2018-04-21 NOTE — Progress Notes (Signed)
Infusion orders are current for patient CBC CMP Tylenol Benadryl appointments are up to date and follow up appointment  is scheduled TB gold not due yet.  

## 2018-04-22 NOTE — Progress Notes (Signed)
Virtual Visit via Telephone Note  I connected with [name] on 04/22/18 at  2:00 PM EDT by telephone and verified that I am speaking with the correct person using two identifiers.   I discussed the limitations, risks, security and privacy concerns of performing an evaluation and management service by telephone and the availability of in person appointments. I also discussed with the patient that there may be a patient responsible charge related to this service. The patient expressed understanding and agreed to proceed.  CC: Pain in multiple joints   History of Present Illness: Patient is a 78 year old female with a past medical history of seronegative rheumatoid arthritis, gout, and osteoarthritis.  She is on Orencia IV infusions every 28 days.  She is taking allopurinol 300 mg po daily for management of gout. She denies any recent gout flares. She is taking Lyrica 75 mg po at bedtime for neuropathic pain.  She is having pain and swelling in both hands, both wrist joints, and right knee joint.  She is having a throbbing pain in both hands and both shoulder joints.  She has postponed her orencia infusions due to the concern for COVID-19.  She does not want to reschedule at this time.    Review of Systems  Constitutional: Positive for malaise/fatigue. Negative for fever.  HENT:       +Mouth dryness  Eyes: Negative for photophobia, pain, discharge and redness.       +Eye dryness   Respiratory: Negative for cough, shortness of breath and wheezing.   Cardiovascular: Negative for chest pain and palpitations.  Gastrointestinal: Positive for diarrhea. Negative for blood in stool and constipation.  Genitourinary: Negative for dysuria.  Musculoskeletal: Positive for joint pain. Negative for back pain, myalgias and neck pain.       +Joint swelling +Morning stiffness.   Skin: Negative for rash.  Neurological: Negative for dizziness and headaches.  Psychiatric/Behavioral: Negative for  depression. The patient is not nervous/anxious and does not have insomnia.      Observations/Objective: Physical Exam  Constitutional: She is oriented to person, place, and time.  Neurological: She is alert and oriented to person, place, and time.  Psychiatric: Mood, memory, affect and judgment normal.   Patient reports morning stiffness for 2  hours.   Patient reports nocturnal pain.  Difficulty dressing/grooming: Denies Difficulty climbing stairs: Reports Difficulty getting out of chair: Reports Difficulty using hands for taps, buttons, cutlery, and/or writing: Reports  Assessment and Plan: Rheumatoid arthritis of multiple sites with negative rheumatoid factor [facility]): She is currently having pain and swelling in both hands, both wrist joints, and the right knee joint.  She has a throbbing sensation in both shoulder joints and both hands.  She is having difficulty with ADLs currently.  She has morning stiffness lasting 2 hours. She is currently having a flare since she has postponed the Orenica infusion due to concern about COVID-19.  Her last infusion was 02/10/18. She will notify us when she would like to reschedule the Orencia infusion.  She will follow up 3 months.    High risk medication use - Orencia IV infusions every 28 days, (arava discontinued due to the cost, methotrexate caused elevated creatinine and LFTs,Remicade,PLQ-inadequate response).    Idiopathic chronic gout of multiple sites without tophus -She is taking Allopurinol 300 mg 1 tablet by mouth daily. She has not had any recent gout flares. Uric acid on 11/24/17 was 7.9. Future order for uric acid was placed today.  Primary osteoarthritis of right knee -She has right knee joint pain and joint swelling currently.   H/O total knee replacement, left: Doing well.  She has no discomfort at this time.   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 No active disease  noted.   DDD (degenerative disc disease), lumbar: Chronic pain  Follow Up Instructions: She will follow up in 3 months.  Future orders for CBC, CMP, and uric acid will be placed today.   I discussed the assessment and treatment plan with the patient. The patient was provided an opportunity to ask questions and all were answered. The patient agreed with the plan and demonstrated an understanding of the instructions.   The patient was advised to call back or seek an in-person evaluation if the symptoms worsen or if the condition fails to improve as anticipated.  I provided 25 minutes of non-face-to-face time during this encounter. Bo Merino, MD   Scribed by-  Ofilia Neas, PA-C

## 2018-04-27 ENCOUNTER — Telehealth (INDEPENDENT_AMBULATORY_CARE_PROVIDER_SITE_OTHER): Payer: Medicare Other | Admitting: Rheumatology

## 2018-04-27 ENCOUNTER — Encounter: Payer: Self-pay | Admitting: Rheumatology

## 2018-04-27 DIAGNOSIS — N183 Chronic kidney disease, stage 3 unspecified: Secondary | ICD-10-CM

## 2018-04-27 DIAGNOSIS — Z96652 Presence of left artificial knee joint: Secondary | ICD-10-CM

## 2018-04-27 DIAGNOSIS — M0609 Rheumatoid arthritis without rheumatoid factor, multiple sites: Secondary | ICD-10-CM

## 2018-04-27 DIAGNOSIS — M1A09X0 Idiopathic chronic gout, multiple sites, without tophus (tophi): Secondary | ICD-10-CM

## 2018-04-27 DIAGNOSIS — M1711 Unilateral primary osteoarthritis, right knee: Secondary | ICD-10-CM

## 2018-04-27 DIAGNOSIS — Z79899 Other long term (current) drug therapy: Secondary | ICD-10-CM

## 2018-04-27 DIAGNOSIS — M5136 Other intervertebral disc degeneration, lumbar region: Secondary | ICD-10-CM

## 2018-04-27 DIAGNOSIS — R7612 Nonspecific reaction to cell mediated immunity measurement of gamma interferon antigen response without active tuberculosis: Secondary | ICD-10-CM

## 2018-04-27 DIAGNOSIS — I5032 Chronic diastolic (congestive) heart failure: Secondary | ICD-10-CM

## 2018-04-27 DIAGNOSIS — Z8679 Personal history of other diseases of the circulatory system: Secondary | ICD-10-CM

## 2018-04-27 DIAGNOSIS — Z8639 Personal history of other endocrine, nutritional and metabolic disease: Secondary | ICD-10-CM

## 2018-04-29 ENCOUNTER — Other Ambulatory Visit: Payer: Self-pay | Admitting: *Deleted

## 2018-04-29 NOTE — Progress Notes (Signed)
Infusion orders are current for patient CBC CMP Tylenol Benadryl appointments are up to date and follow up appointment  is scheduled yearly chest x-ray is not due yet.

## 2018-05-03 ENCOUNTER — Encounter (HOSPITAL_COMMUNITY): Payer: Medicare Other

## 2018-05-04 ENCOUNTER — Other Ambulatory Visit: Payer: Self-pay

## 2018-05-04 ENCOUNTER — Telehealth: Payer: Self-pay

## 2018-05-04 DIAGNOSIS — M0609 Rheumatoid arthritis without rheumatoid factor, multiple sites: Secondary | ICD-10-CM

## 2018-05-04 NOTE — Progress Notes (Signed)
Please draw uric acid as well.

## 2018-05-04 NOTE — Telephone Encounter (Signed)

## 2018-05-05 ENCOUNTER — Ambulatory Visit (HOSPITAL_COMMUNITY)
Admission: RE | Admit: 2018-05-05 | Discharge: 2018-05-05 | Disposition: A | Discharge status: Home / Self Care | Payer: Medicare Other | Source: Ambulatory Visit | Attending: Rheumatology | Admitting: Rheumatology

## 2018-05-05 ENCOUNTER — Other Ambulatory Visit: Payer: Self-pay

## 2018-05-05 DIAGNOSIS — M0609 Rheumatoid arthritis without rheumatoid factor, multiple sites: Secondary | ICD-10-CM | POA: Diagnosis not present

## 2018-05-05 LAB — COMPREHENSIVE METABOLIC PANEL
ALT: 12 U/L (ref 0–44)
AST: 20 U/L (ref 15–41)
Albumin: 3.6 g/dL (ref 3.5–5.0)
Alkaline Phosphatase: 79 U/L (ref 38–126)
Anion gap: 13 (ref 5–15)
BUN: 17 mg/dL (ref 8–23)
CO2: 22 mmol/L (ref 22–32)
Calcium: 9.6 mg/dL (ref 8.9–10.3)
Chloride: 106 mmol/L (ref 98–111)
Creatinine, Ser: 1.59 mg/dL — ABNORMAL HIGH (ref 0.44–1.00)
GFR calc Af Amer: 35 mL/min — ABNORMAL LOW (ref >60)
GFR calc non Af Amer: 31 mL/min — ABNORMAL LOW (ref >60)
Glucose, Bld: 159 mg/dL — ABNORMAL HIGH (ref 70–99)
Potassium: 4.4 mmol/L (ref 3.5–5.1)
Sodium: 141 mmol/L (ref 135–145)
Total Bilirubin: 0.5 mg/dL (ref 0.3–1.2)
Total Protein: 7.1 g/dL (ref 6.5–8.1)

## 2018-05-05 LAB — CBC
HCT: 31.9 % — ABNORMAL LOW (ref 36.0–46.0)
Hemoglobin: 10.1 g/dL — ABNORMAL LOW (ref 12.0–15.0)
MCH: 28 pg (ref 26.0–34.0)
MCHC: 31.7 g/dL (ref 30.0–36.0)
MCV: 88.4 fL (ref 80.0–100.0)
Platelets: 320 10*3/uL (ref 150–400)
RBC: 3.61 MIL/uL — ABNORMAL LOW (ref 3.87–5.11)
RDW: 14.9 % (ref 11.5–15.5)
WBC: 7.7 10*3/uL (ref 4.0–10.5)
nRBC: 0 % (ref 0.0–0.2)

## 2018-05-05 MED ORDER — DIPHENHYDRAMINE HCL 25 MG PO CAPS
ORAL_CAPSULE | ORAL | Status: AC
  Administered 2018-05-05: 25 mg via ORAL
  Filled 2018-05-05: qty 1

## 2018-05-05 MED ORDER — DIPHENHYDRAMINE HCL 25 MG PO CAPS
25.0000 mg | ORAL_CAPSULE | Freq: Once | ORAL | Status: AC
  Administered 2018-05-05: 13:00:00 25 mg via ORAL

## 2018-05-05 MED ORDER — SODIUM CHLORIDE 0.9 % IV SOLN
750.0000 mg | Freq: Once | INTRAVENOUS | Status: AC
  Administered 2018-05-05: 750 mg via INTRAVENOUS
  Filled 2018-05-05 (×2): qty 30

## 2018-05-05 MED ORDER — ACETAMINOPHEN 325 MG PO TABS
650.0000 mg | ORAL_TABLET | Freq: Once | ORAL | Status: AC
  Administered 2018-05-05: 650 mg via ORAL

## 2018-05-05 MED ORDER — ACETAMINOPHEN 325 MG PO TABS
ORAL_TABLET | ORAL | Status: AC
  Filled 2018-05-05: qty 2

## 2018-05-06 NOTE — Progress Notes (Signed)
Cr is elevated. Please fax the labs to her PCP and also to her nephrologist.

## 2018-05-11 ENCOUNTER — Telehealth (INDEPENDENT_AMBULATORY_CARE_PROVIDER_SITE_OTHER): Payer: Medicare Other | Admitting: Cardiology

## 2018-05-11 ENCOUNTER — Encounter: Payer: Self-pay | Admitting: Cardiology

## 2018-05-11 ENCOUNTER — Other Ambulatory Visit: Payer: Self-pay

## 2018-05-11 VITALS — BP 120/70 | Ht 63.0 in | Wt 171.0 lb

## 2018-05-11 DIAGNOSIS — I10 Essential (primary) hypertension: Secondary | ICD-10-CM

## 2018-05-11 DIAGNOSIS — I350 Nonrheumatic aortic (valve) stenosis: Secondary | ICD-10-CM | POA: Diagnosis not present

## 2018-05-11 DIAGNOSIS — E890 Postprocedural hypothyroidism: Secondary | ICD-10-CM

## 2018-05-11 DIAGNOSIS — E7800 Pure hypercholesterolemia, unspecified: Secondary | ICD-10-CM

## 2018-05-11 NOTE — Patient Instructions (Signed)
Medication Instructions:  The current medical regimen is effective;  continue present plan and medications.  If you need a refill on your cardiac medications before your next appointment, please call your pharmacy.   Testing/Procedures: Your physician has requested that you have an echocardiogram in 1 year before seeing Dr Marlou Porch. Echocardiography is a painless test that uses sound waves to create images of your heart. It provides your doctor with information about the size and shape of your heart and how well your heart's chambers and valves are working. This procedure takes approximately one hour. There are no restrictions for this procedure.  Follow-Up: Follow up in 1 year with Dr. Marlou Porch with a 2 D Echocardiogram before you see him.  You will receive a letter in the mail 2 months before you are due.  Please call us when you receive this letter to schedule your follow up appointment.   Thank you for choosing [facility]!!

## 2018-05-11 NOTE — Progress Notes (Signed)
Virtual Visit via Telephone Note   This visit type was conducted due to national recommendations for restrictions regarding the COVID-19 Pandemic (e.g. social distancing) in an effort to limit this patient's exposure and mitigate transmission in our community.  Due to her co-morbid illnesses, this patient is at least at moderate risk for complications without adequate follow up.  This format is felt to be most appropriate for this patient at this time.  The patient did not have access to video technology/had technical difficulties with video requiring transitioning to audio format only (telephone).  All issues noted in this document were discussed and addressed.  No physical exam could be performed with this format.  Please refer to the patient's chart for her  consent to telehealth for [facility].   Date:  05/11/2018   ID:  [name], DOB 1940-07-04, MRN [id number]  Patient Location: Home Provider Location: Home  PCP:  Jonathon Jordan, MD  Cardiologist:  Candee Furbish, MD  Electrophysiologist:  Thompson Grayer, MD   Evaluation Performed:  Follow-Up Visit  Chief Complaint: Aortic stenosis follow-up  History of Present Illness:    [name] is a 78 y.o. female with aortic stenosis here for follow-up.  Hospitalized in February 2020.  I reviewed note from Dr. Acie Fredrickson who states that echocardiogram revealed slight worsening of her aortic stenosis, probably in the moderate range.  None of her symptoms were consistent with aortic stenosis and her exam is not consistent with severe aortic stenosis.  Reviewed the echo personally and thought it was more in the moderate range.  She had a TSH during admission of 87.  She is bradycardic.  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  No indication for pacemaker.  Was in the mid 20s.  Chronic kidney disease with creatinine 1.4-1.59  Having good and bad days. RA aches. Hands, ankles. Recently had infusion. Minimal cough, no fever.    Increased synthroid to 249mcg.   The patient does not have symptoms concerning for COVID-19 infection (fever, chills, cough, or new shortness of breath).    Past Medical History:  Diagnosis Date  . Allergic rhinitis   . Anemia   . Aortic stenosis 12/22/2012   Mild 3/14-2.35 m/s peak velocity  . Cataract 2018  . CHF (congestive heart failure) ([place])   . Chronic pain   . CKD (chronic kidney disease), stage III ([place])   . Diabetes mellitus   . Diverticulosis   . DVT (deep venous thrombosis) ([place])   . Glaucoma   . Hemorrhoids    internal  . HTN (hypertension)   . Hypercholesteremia   . Hyperlipidemia 12/22/2012  . Hypothyroidism   . Meningioma ([place]) 2008  . Osteoarthritis   . Peripheral neuropathy   . PPD positive    6 months ago  . PVC (premature ventricular contraction) 12/22/2012  . Rheumatoid arthritis(714.0)   . Sickle cell trait ([place])   . 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 ABDOMINAL HYSTERECTOMY  1978   TAH,& BSO 1  . CATARACT EXTRACTION    . fistula repair  03/2010   partial colectomy  . intracranial surgery  11/2007   removal of meningioma  . JOINT REPLACEMENT  2001   left TKR  . LEFT HEART CATHETERIZATION WITH CORONARY ANGIOGRAM N/A 08/08/2013   Procedure: LEFT HEART CATHETERIZATION WITH CORONARY ANGIOGRAM;  Surgeon: Peter M Martinique, MD;  Location:  [facility] CATH [facility];  Service: Cardiovascular;  Laterality: N/A;  . ROTATOR CUFF REPAIR  1999   BIlateral  . SIGMOIDECTOMY  2012  . TOE SURGERY Left 2018  . TONSILLECTOMY AND ADENOIDECTOMY    . TOTAL THYROIDECTOMY  1992  . wrist sx     right      Current Meds  Medication Sig  . Abatacept (ORENCIA IV) Inject 750 mg into the vein See admin instructions. Abatacept (ORENCIA) 750 mg in sodium chloride 0.9 % 100 mL IVPB every 28 days  . acetaminophen (TYLENOL) 500 MG tablet Take 1,000 mg by mouth every 6 (six)  hours as needed for mild pain.  Marland Kitchen allopurinol (ZYLOPRIM) 300 MG tablet Take 1 tablet (300 mg total) by mouth daily.  Marland Kitchen aspirin EC 81 MG EC tablet Take 1 tablet (81 mg total) by mouth daily.  Marland Kitchen atorvastatin (LIPITOR) 80 MG tablet Take 80 mg by mouth daily at 6 PM.   . BD INSULIN SYRINGE ULTRAFINE 31G X 15/64" 0.3 ML MISC   . cholecalciferol (VITAMIN D) 1000 UNITS tablet Take 1,000 Units by mouth daily.   . clotrimazole-betamethasone (LOTRISONE) cream Apply 1 application topically as needed.   . cyclobenzaprine (FLEXERIL) 10 MG tablet Take 10 mg by mouth daily as needed for muscle spasms.   . ferrous sulfate (FER-IN-SOL) 75 (15 Fe) MG/ML SOLN Take 45 mg of iron by mouth daily.  . folic acid (FOLVITE) 1 MG tablet Take 1 mg by mouth daily.  Marland Kitchen HYDROcodone-acetaminophen (NORCO) 5-325 MG tablet Take 1 tablet by mouth every 6 (six) hours as needed for moderate pain.  Marland Kitchen levothyroxine (SYNTHROID, LEVOTHROID) 200 MCG tablet Take 1 tablet (200 mcg total) by mouth daily at 6 (six) AM.  . NOVOLIN 70/30 RELION (70-30) 100 UNIT/ML injection INJECT 6 TO 8 UNITS SUBCUTANEOUSLY THREE TIMES DAILY WITH MEALS  . pregabalin (LYRICA) 75 MG capsule 1 bid  . pyridOXINE (VITAMIN B-6) 100 MG tablet Take 100 mg by mouth daily.  Marland Kitchen VITAMIN E PO Take 1 capsule by mouth daily.      Allergies:   Methotrexate derivatives; Other; Penicillins; Remicade [infliximab]; and Zocor [simvastatin]   Social History   Tobacco Use  . Smoking status: Former Smoker    Packs/day: 0.10    Years: 2.00    Pack years: 0.20    Types: Cigarettes    Last attempt to quit: 01/06/1962    Years since quitting: 56.3  . Smokeless tobacco: Never Used  . Tobacco comment: Socially   Substance Use Topics  . Alcohol use: Yes    Comment: occasionally  . Drug use: No     Family Hx: The patient's family history includes CVA in her mother; Diabetes in her mother; Hypertension in her father; Hypotension in her mother; Kidney failure in her father.   ROS:   Please see the history of present illness.    Denies any syncope bleeding orthopnea PND  All other systems reviewed and are negative.   Prior CV studies:   The following studies were reviewed today:  ECHO 02/11/18:  1. The left ventricle has normal systolic function of 54-62%. The cavity size was normal. There is no increased left ventricular wall thickness. Echo evidence of impaired diastolic relaxation.  2. The right ventricle has normal systolic function. The cavity was normal. There is no increase in right ventricular wall thickness.  3. Left atrial size was moderately dilated.  4. The mitral valve is normal in structure. There is mild calcification. No evidence  of mitral valve stenosis. Trivial mitral regurgitation.  5. The tricuspid valve is normal in structure.  6. The aortic valve is tricuspid There is severe calcifcation of the aortic valve. Aortic valve regurgitation is trivial by color flow Doppler. Aortic valve mean gradient 18 mmHg with AVA 0.8 cm^2. Possible low gradient severe aortic stenosis, aortic  valve needs further workup.  7. The aortic root and ascending aorta are normal in size and structure.  8. No evidence of left ventricular regional wall motion abnormalities.  9. The inferior vena cava is normal in size with greater than 50% respiratory variability. 10. No complete TR doppler jet so unable to estimate PA systolic pressure.   Cardiac catheterization 08/08/13-normal coronary arteries  Event monitor 2/14 showed no adverse arrhythmias.PVC's  Nuclear stress test: 03/05/12-low risk, no ischemia, normal EF  Carotid 02/2016 Bilateral: mild to moderate mixed plaque distal CCA and origin and proximal ICA and ECA. 1-39% ICA stenosis. Vertebral artery flow is Antegrade.  Labs/Other Tests and Data Reviewed:    EKG:  An ECG dated 02/10/2018 was personally reviewed today and demonstrated:  Sinus bradycardia 51 no other abnormalities other than nonspecific T wave  changes  Recent Labs: 02/10/2018: B Natriuretic Peptide 33.3; Magnesium 2.0; TSH 87.941 05/05/2018: ALT 12; BUN 17; Creatinine, Ser 1.59; Hemoglobin 10.1; Platelets 320; Potassium 4.4; Sodium 141   Recent Lipid Panel Lab Results  Component Value Date/Time   CHOL 179 02/28/2016 04:34 AM   TRIG 123 02/28/2016 04:34 AM   HDL 47 02/28/2016 04:34 AM   CHOLHDL 3.8 02/28/2016 04:34 AM   LDLCALC 107 (H) 02/28/2016 04:34 AM    Wt Readings from Last 3 Encounters:  05/11/18 171 lb (77.6 kg)  05/05/18 185 lb (83.9 kg)  03/08/18 188 lb (85.3 kg)     Objective:    Vital Signs:  BP 120/70   Ht 5\' 3"  (1.6 m)   Wt 171 lb (77.6 kg)   LMP  (LMP Unknown)   BMI 30.29 kg/m    VITAL SIGNS:  reviewed nonpressured speech, pleasant, normal respiratory effort heard on phone.  ASSESSMENT & PLAN:    Aortic stenosis  - moderate at least. Mean gradient 90mmHg.  We will check another echocardiogram in February 2021, 1 year after her last one.  No major shortness of breath.  No syncope, no anginal symptoms.  Hypothyroidism - Now on Synthroid 200 mcg.  Likely responsible for bradycardia.  No pacemaker needs.  Essential hypertension -Currently well controlled on medication as above.  Hyperlipidemia - Continue with atorvastatin.   COVID-19 Education: The signs and symptoms of COVID-19 were discussed with the patient and how to seek care for testing (follow up with PCP or arrange E-visit).  The importance of social distancing was discussed today.  Time:   Today, I have spent 11 minutes with the patient with telehealth technology discussing the above problems.     Medication Adjustments/Labs and Tests Ordered: Current medicines are reviewed at length with the patient today.  Concerns regarding medicines are outlined above.   Tests Ordered: Orders Placed This Encounter  Procedures  . ECHOCARDIOGRAM COMPLETE    Medication Changes: No orders of the defined types were placed in this encounter.    Disposition:  Follow up in 1 year(s) (after next ECHO)  Signed, Candee Furbish, MD  05/11/2018 2:10 PM    [place]

## 2018-05-12 ENCOUNTER — Ambulatory Visit: Payer: Medicare Other | Admitting: Cardiology

## 2018-05-17 DIAGNOSIS — R600 Localized edema: Secondary | ICD-10-CM | POA: Diagnosis not present

## 2018-05-17 DIAGNOSIS — L309 Dermatitis, unspecified: Secondary | ICD-10-CM | POA: Diagnosis not present

## 2018-05-18 ENCOUNTER — Ambulatory Visit
Admission: RE | Admit: 2018-05-18 | Discharge: 2018-05-18 | Disposition: A | Discharge status: Home / Self Care | Payer: Medicare Other | Source: Ambulatory Visit | Attending: Family Medicine | Admitting: Family Medicine

## 2018-05-18 ENCOUNTER — Other Ambulatory Visit: Payer: Self-pay | Admitting: Family Medicine

## 2018-05-18 DIAGNOSIS — M7989 Other specified soft tissue disorders: Secondary | ICD-10-CM

## 2018-05-18 DIAGNOSIS — R600 Localized edema: Secondary | ICD-10-CM | POA: Diagnosis not present

## 2018-05-21 DIAGNOSIS — M109 Gout, unspecified: Secondary | ICD-10-CM | POA: Diagnosis not present

## 2018-05-21 DIAGNOSIS — M069 Rheumatoid arthritis, unspecified: Secondary | ICD-10-CM | POA: Diagnosis not present

## 2018-05-21 DIAGNOSIS — G894 Chronic pain syndrome: Secondary | ICD-10-CM | POA: Diagnosis not present

## 2018-05-21 DIAGNOSIS — B356 Tinea cruris: Secondary | ICD-10-CM | POA: Diagnosis not present

## 2018-05-21 DIAGNOSIS — M4810 Ankylosing hyperostosis [Forestier], site unspecified: Secondary | ICD-10-CM | POA: Diagnosis not present

## 2018-05-21 DIAGNOSIS — M797 Fibromyalgia: Secondary | ICD-10-CM | POA: Diagnosis not present

## 2018-05-28 ENCOUNTER — Other Ambulatory Visit: Payer: Self-pay | Admitting: *Deleted

## 2018-05-28 DIAGNOSIS — M0609 Rheumatoid arthritis without rheumatoid factor, multiple sites: Secondary | ICD-10-CM

## 2018-06-02 ENCOUNTER — Other Ambulatory Visit: Payer: Self-pay

## 2018-06-02 ENCOUNTER — Encounter (HOSPITAL_COMMUNITY)
Admission: RE | Admit: 2018-06-02 | Discharge: 2018-06-02 | Disposition: A | Discharge status: Home / Self Care | Payer: Medicare Other | Source: Ambulatory Visit | Attending: Rheumatology | Admitting: Rheumatology

## 2018-06-02 DIAGNOSIS — M0609 Rheumatoid arthritis without rheumatoid factor, multiple sites: Secondary | ICD-10-CM | POA: Insufficient documentation

## 2018-06-02 MED ORDER — ACETAMINOPHEN 325 MG PO TABS: 650.0000 mg | ORAL_TABLET | ORAL | Status: DC

## 2018-06-02 MED ORDER — DIPHENHYDRAMINE HCL 25 MG PO CAPS: 25.0000 mg | ORAL_CAPSULE | ORAL | Status: DC

## 2018-06-02 MED ORDER — SODIUM CHLORIDE 0.9 % IV SOLN
750.0000 mg | INTRAVENOUS | Status: DC
  Administered 2018-06-02: 750 mg via INTRAVENOUS
  Filled 2018-06-02: qty 30

## 2018-06-16 ENCOUNTER — Other Ambulatory Visit: Payer: Self-pay | Admitting: *Deleted

## 2018-06-16 NOTE — Progress Notes (Signed)
Infusion orders are current for patient CBC CMP Tylenol Benadryl appointments are up to date and follow up appointment  is scheduled yearly chest x-ray is up to date.

## 2018-06-24 DIAGNOSIS — M25551 Pain in right hip: Secondary | ICD-10-CM | POA: Diagnosis not present

## 2018-06-24 DIAGNOSIS — M069 Rheumatoid arthritis, unspecified: Secondary | ICD-10-CM | POA: Diagnosis not present

## 2018-06-24 DIAGNOSIS — G894 Chronic pain syndrome: Secondary | ICD-10-CM | POA: Diagnosis not present

## 2018-06-24 DIAGNOSIS — M797 Fibromyalgia: Secondary | ICD-10-CM | POA: Diagnosis not present

## 2018-06-30 ENCOUNTER — Encounter (HOSPITAL_COMMUNITY)
Admission: RE | Admit: 2018-06-30 | Discharge: 2018-06-30 | Disposition: A | Discharge status: Home / Self Care | Payer: Medicare Other | Source: Ambulatory Visit | Attending: Rheumatology | Admitting: Rheumatology

## 2018-06-30 ENCOUNTER — Other Ambulatory Visit: Payer: Self-pay

## 2018-06-30 DIAGNOSIS — M0609 Rheumatoid arthritis without rheumatoid factor, multiple sites: Secondary | ICD-10-CM | POA: Diagnosis not present

## 2018-06-30 LAB — COMPREHENSIVE METABOLIC PANEL
ALT: 13 U/L (ref 0–44)
AST: 26 U/L (ref 15–41)
Albumin: 3.6 g/dL (ref 3.5–5.0)
Alkaline Phosphatase: 80 U/L (ref 38–126)
Anion gap: 9 (ref 5–15)
BUN: 19 mg/dL (ref 8–23)
CO2: 21 mmol/L — ABNORMAL LOW (ref 22–32)
Calcium: 9.5 mg/dL (ref 8.9–10.3)
Chloride: 110 mmol/L (ref 98–111)
Creatinine, Ser: 1.61 mg/dL — ABNORMAL HIGH (ref 0.44–1.00)
GFR calc Af Amer: 35 mL/min — ABNORMAL LOW (ref >60)
GFR calc non Af Amer: 30 mL/min — ABNORMAL LOW (ref >60)
Glucose, Bld: 142 mg/dL — ABNORMAL HIGH (ref 70–99)
Potassium: 4.6 mmol/L (ref 3.5–5.1)
Sodium: 140 mmol/L (ref 135–145)
Total Bilirubin: 1 mg/dL (ref 0.3–1.2)
Total Protein: 6.8 g/dL (ref 6.5–8.1)

## 2018-06-30 LAB — CBC
HCT: 31.1 % — ABNORMAL LOW (ref 36.0–46.0)
Hemoglobin: 9.9 g/dL — ABNORMAL LOW (ref 12.0–15.0)
MCH: 28 pg (ref 26.0–34.0)
MCHC: 31.8 g/dL (ref 30.0–36.0)
MCV: 87.9 fL (ref 80.0–100.0)
Platelets: 318 10*3/uL (ref 150–400)
RBC: 3.54 MIL/uL — ABNORMAL LOW (ref 3.87–5.11)
RDW: 16.8 % — ABNORMAL HIGH (ref 11.5–15.5)
WBC: 7.1 10*3/uL (ref 4.0–10.5)
nRBC: 0 % (ref 0.0–0.2)

## 2018-06-30 MED ORDER — SODIUM CHLORIDE 0.9 % IV SOLN
750.0000 mg | INTRAVENOUS | Status: DC
  Administered 2018-06-30: 750 mg via INTRAVENOUS
  Filled 2018-06-30: qty 30

## 2018-06-30 MED ORDER — ACETAMINOPHEN 325 MG PO TABS: 650.0000 mg | ORAL_TABLET | ORAL | Status: DC

## 2018-06-30 MED ORDER — DIPHENHYDRAMINE HCL 25 MG PO CAPS: 25.0000 mg | ORAL_CAPSULE | ORAL | Status: DC

## 2018-07-13 NOTE — Progress Notes (Signed)
Office Visit Note  Patient: [name]             Date of Birth: 06-Jan-1941           MRN: [id number]             PCP: Jonathon Jordan, MD Referring: Jonathon Jordan, MD Visit Date: 07/14/2018 Occupation: @GUAROCC @  Subjective:  Pain in multiple joints   History of Present Illness: [name] is a 78 y.o. female with history of seronegative rheumatoid arthritis, osteoarthritis, and gout.  Patient is on Orencia 750 mg IV infusions every 28 days.  Her last infusion was on 06/30/2018.  She has been having a rheumatoid arthritis flare for the past 2 weeks.  She is currently having pain in both shoulders, right elbow, right wrist, right hand. She has swelling in the right hand, wrist, and right elbow joints.   She is having pain in both knees and both ankles.  She is having knee joint swelling. She has been taking Hydrocodone for pain relief.  She is currently having pain in the left great toe.  She is taking allopurinol 300 mg po daily. She denies missing any doses. She has not had any red meat, shellfish, or beer.    Activities of Daily Living:  Patient reports morning stiffness for several hours.   Patient Reports nocturnal pain.  Difficulty dressing/grooming: Reports Difficulty climbing stairs: Reports Difficulty getting out of chair: Reports Difficulty using hands for taps, buttons, cutlery, and/or writing: Reports  Review of Systems  Constitutional: Positive for fatigue.  HENT: Negative for mouth sores, mouth dryness and nose dryness.   Eyes: Negative for pain and itching.  Respiratory: Negative for shortness of breath, wheezing and difficulty breathing.   Cardiovascular: Negative for chest pain and swelling in legs/feet.  Gastrointestinal: Negative for abdominal pain, constipation and diarrhea.  Endocrine: Negative for increased urination.  Genitourinary: Negative for painful urination and pelvic pain.  Musculoskeletal: Positive for arthralgias, joint pain,  joint swelling and morning stiffness.  Skin: Negative for rash and redness.  Allergic/Immunologic: Negative for susceptible to infections.  Neurological: Positive for headaches and weakness. Negative for dizziness and memory loss.  Hematological: Negative for swollen glands.  Psychiatric/Behavioral: Negative for confusion. The patient is not nervous/anxious.     PMFS History:  Patient Active Problem List   Diagnosis Date Noted  . Near syncope 02/12/2018  . Orthostatic hypotension 02/12/2018  . Symptomatic bradycardia 02/10/2018  . Primary osteoarthritis of right knee 09/25/2016  . 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 Elevated d-dimer   . Encephalopathy   . Sinus bradycardia   . Acute encephalopathy 02/27/2016  . Seropositive rheumatoid arthritis of multiple sites ([place]) 01/17/2016  . Bilateral hand pain 01/17/2016  . Bilateral wrist pain 01/17/2016  . Pain in right elbow 01/17/2016  . Left shoulder pain 01/17/2016  . Encounter for therapeutic drug monitoring 11/02/2015  . Bronchiectasis without complication ([place]) 36/64/4034  . Chronic gout involving toe without tophus 10/30/2015  . High risk medication use 10/29/2015  . Gout 10/29/2015  . H/O total knee replacement, left 10/29/2015  . 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 Diabetes mellitus, type II, insulin dependent ([place]) 10/18/2015  . AKI (acute kidney injury) ([place]) 10/18/2015  . CKD (chronic kidney disease) stage 3, GFR 30-59 ml/min (HCC) 10/18/2015  . Nodule of left lung 10/18/2015  . Neuropathy 08/22/2014  . Hypothyroidism 08/22/2014  . Chronic pain 08/22/2014  . Chronic  congestive heart failure with left ventricular diastolic dysfunction ([place]) 08/08/2013  . Chest pain 08/05/2013  . Aortic stenosis 12/22/2012  . Hyperlipidemia 12/22/2012  . PVC (premature ventricular contraction) 12/22/2012  . HTN (hypertension) 12/22/2012  . Stokes-Adams syncope  12/22/2012  . Open angle with borderline findings, high risk 12/15/2011  . Cataract, nuclear 05/25/2011    Past Medical History:  Diagnosis Date  . Allergic rhinitis   . Anemia   . Aortic stenosis 12/22/2012   Mild 3/14-2.35 m/s peak velocity  . Cataract 2018  . CHF (congestive heart failure) ([place])   . Chronic pain   . CKD (chronic kidney disease), stage III ([place])   . Diabetes mellitus   . Diverticulosis   . DVT (deep venous thrombosis) ([place])   . Glaucoma   . Hemorrhoids    internal  . HTN (hypertension)   . Hypercholesteremia   . Hyperlipidemia 12/22/2012  . Hypothyroidism   . Meningioma ([place]) 2008  . Osteoarthritis   . Peripheral neuropathy   . PPD positive    6 months ago  . PVC (premature ventricular contraction) 12/22/2012  . Rheumatoid arthritis(714.0)   . Sickle cell trait ([place])   . 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 Hypotension Mother   . CVA Mother   . Diabetes Mother   . Hypertension Father   . Kidney failure Father    Past Surgical History:  Procedure Laterality Date  . ABDOMINAL HYSTERECTOMY  1978   TAH,& BSO 1  . CATARACT EXTRACTION    . fistula repair  03/2010   partial colectomy  . intracranial surgery  11/2007   removal of meningioma  . JOINT REPLACEMENT  2001   left TKR  . LEFT HEART CATHETERIZATION WITH CORONARY ANGIOGRAM N/A 08/08/2013   Procedure: LEFT HEART CATHETERIZATION WITH CORONARY ANGIOGRAM;  Surgeon: Peter M Martinique, MD;  Location: [facility] CATH LAB;  Service: Cardiovascular;  Laterality: N/A;  . ROTATOR CUFF REPAIR  1999   BIlateral  . SIGMOIDECTOMY  2012  . TOE SURGERY Left 2018  . TONSILLECTOMY AND ADENOIDECTOMY    . TOTAL THYROIDECTOMY  1992  . wrist sx     right    Social History   Social History Narrative   Daycare Provider   Drinks about 1 cup of coffee a day    Immunization History  Administered Date(s)  Administered  . Influenza-Unspecified 09/11/2015     Objective: Vital Signs: BP 118/72 (BP Location: Left Arm, Patient Position: Sitting, Cuff Size: Normal)   Pulse 82   Resp 15   Ht 5\' 4"  (1.626 m)   Wt 177 lb 9.6 oz (80.6 kg)   LMP  (LMP Unknown)   BMI 30.48 kg/m    Physical Exam Vitals signs and nursing note reviewed.  Constitutional:      Appearance: She is well-developed.  HENT:     Head: Normocephalic and atraumatic.  Eyes:     Conjunctiva/sclera: Conjunctivae normal.  Neck:     Musculoskeletal: Normal range of motion.  Cardiovascular:     Rate and Rhythm: Normal rate and regular rhythm.     Heart sounds: Normal heart sounds.  Pulmonary:     Effort: Pulmonary effort is normal.     Breath sounds: Normal breath sounds.  Abdominal:     General: Bowel sounds are normal.     Palpations: Abdomen is  soft.  Lymphadenopathy:     Cervical: No cervical adenopathy.  Skin:    General: Skin is warm and dry.     Capillary Refill: Capillary refill takes less than 2 seconds.  Neurological:     Mental Status: She is alert and oriented to person, place, and time.  Psychiatric:        Behavior: Behavior normal.      Musculoskeletal Exam: C-spine limited range of motion without rotation.  Discomfort with C-spine range of motion.  Thoracic kyphosis noted.  Shoulder right abduction to about 90 degrees with discomfort.  Elbow joints have good range of motion.  She has tenderness of bilateral elbow joints.  Wrist joints have tenderness but no obvious synovitis.  She has synovial thickening and synovitis of several MCP and PIP joints as described below.  Right knee joint is warm and has pain with range of motion.  Left knee replacement has slightly limited extension with mild warmth.  She has tenderness and inflammation of bilateral ankle joints.  Pedal edema noted bilaterally.  CDAI Exam: CDAI Score: 27.4  Patient Global: 8 mm; Provider Global: 6 mm Swollen: 13 ; Tender: 19  Joint  Exam      Right  Left  Glenohumeral   Tender   Tender  Elbow   Tender   Tender  Wrist   Tender   Tender  MCP 1  Swollen Tender     MCP 2  Swollen Tender  Swollen Tender  MCP 3  Swollen Tender  Swollen Tender  MCP 4  Swollen Tender     MCP 5  Swollen Tender     PIP 3  Swollen Tender     PIP 4  Swollen Tender     Knee  Swollen Tender     Ankle  Swollen Tender  Swollen Tender  MTP 1     Swollen Tender     Investigation: No additional findings.  Imaging: No results found.  Recent Labs: Lab Results  Component Value Date   WBC 7.1 06/30/2018   HGB 9.9 (L) 06/30/2018   PLT 318 06/30/2018   NA 140 06/30/2018   K 4.6 06/30/2018   CL 110 06/30/2018   CO2 21 (L) 06/30/2018   GLUCOSE 142 (H) 06/30/2018   BUN 19 06/30/2018   CREATININE 1.61 (H) 06/30/2018   BILITOT 1.0 06/30/2018   ALKPHOS 80 06/30/2018   AST 26 06/30/2018   ALT 13 06/30/2018   PROT 6.8 06/30/2018   ALBUMIN 3.6 06/30/2018   CALCIUM 9.5 06/30/2018   GFRAA 35 (L) 06/30/2018   QFTBGOLD Positive (A) 04/28/2014    Speciality Comments: Orencia 750mg  IV  every 4 weeks TB gold has been positive in the past, patient has yearly chest xrays  Procedures:  No procedures performed Allergies: Methotrexate derivatives, Other, Penicillins, Remicade [infliximab], and Zocor [simvastatin]     Assessment / Plan:     Visit Diagnoses: Rheumatoid arthritis of multiple sites with negative rheumatoid factor ([place]) - She has active synovitis as described above.  She is currently having a rheumatoid arthritis flare.  She has been having increased pain in multiple joints for the past 2 weeks.  She has been receiving Orencia 750 mg IV infusions every 28 days.  Her last infusion was on 06/30/2018.  She is currently on monotherapy.  We discussed restarting her [place] for combination therapy but she does not want to restart Arava due to cost.  She has failed Enbrel and Remicade in the past.  She cannot tolerate Plaquenil or methotrexate.   She is not a candidate for anti-TNF's due to history of congestive heart failure.  We discussed switching from Orencia to Actemra IV infusions.  She is in agreement.  Indications, contraindications, potential side effects of Actemra were discussed.  All questions were addressed and consent was obtained today.  We will apply for Actemra IV infusions through her insurance.  She will follow up in the office in 3 months.   High risk medication use - Orencia 750 mg IV infusion every 28 days and last infusion on 06/30/2018.  TB positive in the past but last chest x-ray on 02/10/2018 showed no acute abnormalities.  Will monitor yearly.  Most recent CBC/CMP stable on 06/30/2018.  Due for CBC/CMP in September and will monitor every 3 months.  (arava discontinued due to the cost, methotrexate caused elevated creatinine and LFTs,Remicade,PLQ-inadequate response).   Idiopathic chronic gout of multiple sites without tophus - She has not had any recent gout flares.  She continues to take allopurinol 300 mg 1 tablet by mouth daily.Uric acid on 11/24/17 was 7.9.  We discussed the dietary restrictions to follow.  Primary osteoarthritis of right knee - Chronic pain.  She has limited range of motion with discomfort.  She has warmth of the right knee on exam.  H/O total knee replacement, left - Doing well. She has intermittent pain in the left knee joint.  She has warmth on exam.   Positive QuantiFERON-TB Gold test/ patient has yearly chest xrays  - Most recent chest x-ray on 02/10/18 did not show acute abnormalities.   DDD (degenerative disc disease), lumbar - Chronic pain   Other medical conditions are listed as follows:   Chronic congestive heart failure with left ventricular diastolic dysfunction (HCC)  History of diabetes mellitus   CKD (chronic kidney disease) stage 3, GFR 30-59 ml/min (HCC)   History of hyperlipidemia   History of hypertension   Nodule of left lung - Previous CT scan showed bilateral  pulmonary nodulosis which was a stable from her previous CT scan.    Orders: No orders of the defined types were placed in this encounter.  No orders of the defined types were placed in this encounter.   Face-to-face time spent with patient was 30  minutes. Greater than 50% of time was spent in counseling and coordination of care.  Follow-Up Instructions: Return in about 3 months (around 10/14/2018) for Rheumatoid arthritis, Osteoarthritis, Gout.   Ofilia Neas, PA-C   I examined and evaluated the patient with Hazel Sams PA.  Patient continues to have pain and swelling.  She has synovitis in multiple joints on examination.  She is having an adequate response to Orencia.  We discussed different treatment options adding combination DMARDs.  She does not want to add more medications due to the cost of the DMARDs.  From Orencia to IV Actemra.  She was in agreement.  A handout was given consent was taken.  We will apply for IV Actemra.  The plan of care was discussed as noted above.  Bo Merino, MD  Note - This record has been created using Editor, commissioning.  Chart creation errors have been sought, but may not always  have been located. Such creation errors do not reflect on  the standard of medical care.

## 2018-07-14 ENCOUNTER — Encounter: Payer: Self-pay | Admitting: Rheumatology

## 2018-07-14 ENCOUNTER — Ambulatory Visit (INDEPENDENT_AMBULATORY_CARE_PROVIDER_SITE_OTHER): Payer: Medicare Other | Admitting: Rheumatology

## 2018-07-14 ENCOUNTER — Telehealth: Payer: Self-pay | Admitting: Pharmacist

## 2018-07-14 ENCOUNTER — Other Ambulatory Visit: Payer: Self-pay

## 2018-07-14 VITALS — BP 118/72 | HR 82 | Resp 15 | Ht 64.0 in | Wt 177.6 lb

## 2018-07-14 DIAGNOSIS — R911 Solitary pulmonary nodule: Secondary | ICD-10-CM | POA: Diagnosis not present

## 2018-07-14 DIAGNOSIS — Z8679 Personal history of other diseases of the circulatory system: Secondary | ICD-10-CM

## 2018-07-14 DIAGNOSIS — M5136 Other intervertebral disc degeneration, lumbar region: Secondary | ICD-10-CM

## 2018-07-14 DIAGNOSIS — M1A09X0 Idiopathic chronic gout, multiple sites, without tophus (tophi): Secondary | ICD-10-CM | POA: Diagnosis not present

## 2018-07-14 DIAGNOSIS — R7612 Nonspecific reaction to cell mediated immunity measurement of gamma interferon antigen response without active tuberculosis: Secondary | ICD-10-CM | POA: Diagnosis not present

## 2018-07-14 DIAGNOSIS — M0609 Rheumatoid arthritis without rheumatoid factor, multiple sites: Secondary | ICD-10-CM

## 2018-07-14 DIAGNOSIS — Z79899 Other long term (current) drug therapy: Secondary | ICD-10-CM

## 2018-07-14 DIAGNOSIS — I5032 Chronic diastolic (congestive) heart failure: Secondary | ICD-10-CM | POA: Diagnosis not present

## 2018-07-14 DIAGNOSIS — N183 Chronic kidney disease, stage 3 unspecified: Secondary | ICD-10-CM

## 2018-07-14 DIAGNOSIS — Z96652 Presence of left artificial knee joint: Secondary | ICD-10-CM | POA: Diagnosis not present

## 2018-07-14 DIAGNOSIS — Z8639 Personal history of other endocrine, nutritional and metabolic disease: Secondary | ICD-10-CM

## 2018-07-14 DIAGNOSIS — M1711 Unilateral primary osteoarthritis, right knee: Secondary | ICD-10-CM | POA: Diagnosis not present

## 2018-07-14 DIAGNOSIS — M51369 Other intervertebral disc degeneration, lumbar region without mention of lumbar back pain or lower extremity pain: Secondary | ICD-10-CM

## 2018-07-14 NOTE — Progress Notes (Signed)
Pharmacy Note Subjective: Patient presents today to the [facility] to see Dr. Estanislado Pandy.   Patient seen by the pharmacist for counseling on Actemra for rheumatoid arthritis.  She was on Orencia IV infusion with inadequate response.  Last infusion 06/30/2018.  She has tried and failed Remicade/Enbrel and also has history of CHF.  Other prior therapy includes: arava (discontinued due to the cost), methotrexate (elevated creatinine and LFTs), PLQ (inadequate response).   Objective:  CBC    Component Value Date/Time   WBC 7.1 06/30/2018 1134   RBC 3.54 (L) 06/30/2018 1134   HGB 9.9 (L) 06/30/2018 1134   HGB 10.2 (L) 03/12/2010 1102   HCT 31.1 (L) 06/30/2018 1134   HCT 31.2 (L) 03/12/2010 1102   PLT 318 06/30/2018 1134   PLT 481 (H) 03/12/2010 1102   MCV 87.9 06/30/2018 1134   MCV 81.9 03/12/2010 1102   MCH 28.0 06/30/2018 1134   MCHC 31.8 06/30/2018 1134   RDW 16.8 (H) 06/30/2018 1134   RDW 18.9 (H) 03/12/2010 1102   LYMPHSABS 2.1 02/10/2018 1827   LYMPHSABS 2.5 03/12/2010 1102   MONOABS 0.2 02/10/2018 1827   MONOABS 0.4 03/12/2010 1102   EOSABS 0.1 02/10/2018 1827   EOSABS 0.2 03/12/2010 1102   BASOSABS 0.0 02/10/2018 1827   BASOSABS 0.0 03/12/2010 1102    CMP     Component Value Date/Time   NA 140 06/30/2018 1134   K 4.6 06/30/2018 1134   CL 110 06/30/2018 1134   CO2 21 (L) 06/30/2018 1134   GLUCOSE 142 (H) 06/30/2018 1134   BUN 19 06/30/2018 1134   CREATININE 1.61 (H) 06/30/2018 1134   CALCIUM 9.5 06/30/2018 1134   PROT 6.8 06/30/2018 1134   ALBUMIN 3.6 06/30/2018 1134   AST 26 06/30/2018 1134   ALT 13 06/30/2018 1134   ALKPHOS 80 06/30/2018 1134   BILITOT 1.0 06/30/2018 1134   GFRNONAA 30 (L) 06/30/2018 1134   GFRAA 35 (L) 06/30/2018 1134     Baseline Immunosuppressant Therapy Labs  TB positive in the past but last chest x-ray on 02/10/2018 showed no acute abnormalities.  Hepatitis panel:negative 02/27/2005  HIV: not covered under medicare;  negative per patient  Immunoglobulin Electrophoresis Latest Ref Rng & Units 12/12/2009  IgG 694 - 1618 mg/dL 1300  IgM 60 - 263 mg/dL 76    Serum Protein Electrophoresis Latest Ref Rng & Units 06/30/2018  Total Protein 6.5 - 8.1 g/dL 6.8  Albumin 2.9 - 4.4 g/dL -  Alpha-1 0.0 - 0.4 g/dL -  Alpha-2 0.4 - 1.0 g/dL -  Beta Globulin 0.7 - 1.3 g/dL -  Beta 2 3.2 - 6.5 % -  Gamma Globulin 0.4 - 1.8 g/dL -  Interpretation - -    No results found for: G6PDH  No results found for: TPMT   Lipid panel Lab Results  Component Value Date   CHOL 179 02/28/2016   HDL 47 02/28/2016   LDLCALC 107 (H) 02/28/2016   TRIG 123 02/28/2016   CHOLHDL 3.8 02/28/2016     Chest x-ray: no acute abnormality 02/10/2018  Assessment/Plan:  Counseled patient that Actemra is an IL-6 blocking agent.  Reviewed Actemra dose of 4 mg/kg every 4 weeks.  Counseled patient on purpose, proper use, and adverse effects of Actemra.  Reviewed the most common adverse effects including infections, injection site reaction, bowel injury, and rarely cancer and conditions of the nervous system.  Reviewed that the medication should be held during infections.  Discussed that there is  the possibility of an increased risk of malignancy but it is not well understood if this increased risk is due to the medication or the disease state.  Counseled patient that Actemra should be held prior to scheduled surgery.  Counseled patient to avoid live vaccines while on Actemra.  Recommend patient to get annual influenza vaccine, pneumococcal vaccine and Shingrix as indicated.   Reviewed the importance of regular labs while on Actemra therapy including the need for routine lipid panel. Standing orders placed. Provided patient with medication education material and answered all questions.  Patient voiced understanding.  Patient consented to Actemra.  Will upload consent into the media tab.  Will submit benefits investigation for Actemra.    All  questions encouraged and answered.  Instructed patient to call with any further questions or concerns.  Mariella Saa, PharmD, [facility] Clinical Pharmacist  07/14/2018 4:26 PM

## 2018-07-14 NOTE — Telephone Encounter (Signed)
Please start BIV for IV Actemra. She was on Orencia IV infusion with inadequate response.  Last infusion 06/30/2018.  She has tried and failed Remicade/Enbrel and also has history of CHF.  Other prior therapy includes: arava (discontinued due to the cost), methotrexate (elevated creatinine and LFTs), PLQ (inadequate response).

## 2018-07-15 ENCOUNTER — Other Ambulatory Visit: Payer: Self-pay | Admitting: *Deleted

## 2018-07-15 DIAGNOSIS — M0609 Rheumatoid arthritis without rheumatoid factor, multiple sites: Secondary | ICD-10-CM

## 2018-07-15 NOTE — Telephone Encounter (Signed)
Findings of benefits investigation for Actemra IV:  Insurance: Medicare is primary and [employer] program is secondary  Phone: [phone]  Plan is [place] [phone]  Medicare covers 80% of the infusion and no authorization is required, and the patient's secondary [employer] employee plan would cover the 20% of the cost that was not paid for by Medicare; as long as Medicare covered the medication and it is deemed medically necessary (appropriate diagnosis codes). Secondary will also covers the patient's Medicare deductible.  8:42 AM Beatriz Chancellor, CPhT

## 2018-07-15 NOTE — Telephone Encounter (Signed)
Patient notified of approval.  She was scheduled for an infusion on 6/22 for Orencia.  Instructed patient to keep appointment and we will discontinue orders for Orencia and put in orders for her new medication.  Patient verbalized understanding.

## 2018-07-15 NOTE — Telephone Encounter (Signed)
Ref# [id number] Rep- Lattie Haw T

## 2018-07-16 NOTE — Addendum Note (Signed)
Addended by: Carole Binning on: 07/16/2018 08:43 AM   Modules accepted: Orders, SmartSet

## 2018-07-16 NOTE — Telephone Encounter (Signed)
Orencia infusion orders have been cancelled and Actemra orders placed.

## 2018-07-22 DIAGNOSIS — Z01419 Encounter for gynecological examination (general) (routine) without abnormal findings: Secondary | ICD-10-CM | POA: Diagnosis not present

## 2018-07-22 DIAGNOSIS — N644 Mastodynia: Secondary | ICD-10-CM | POA: Diagnosis not present

## 2018-07-22 DIAGNOSIS — B379 Candidiasis, unspecified: Secondary | ICD-10-CM | POA: Diagnosis not present

## 2018-07-23 ENCOUNTER — Other Ambulatory Visit: Payer: Self-pay | Admitting: Obstetrics and Gynecology

## 2018-07-27 ENCOUNTER — Other Ambulatory Visit: Payer: Self-pay

## 2018-07-28 ENCOUNTER — Ambulatory Visit (HOSPITAL_COMMUNITY)
Admission: RE | Admit: 2018-07-28 | Discharge: 2018-07-28 | Disposition: A | Discharge status: Home / Self Care | Payer: Medicare Other | Source: Ambulatory Visit | Attending: Rheumatology | Admitting: Rheumatology

## 2018-07-28 DIAGNOSIS — M0609 Rheumatoid arthritis without rheumatoid factor, multiple sites: Secondary | ICD-10-CM | POA: Insufficient documentation

## 2018-07-28 MED ORDER — DIPHENHYDRAMINE HCL 25 MG PO CAPS: 25.0000 mg | ORAL_CAPSULE | ORAL | Status: DC

## 2018-07-28 MED ORDER — ACETAMINOPHEN 325 MG PO TABS: 650.0000 mg | ORAL_TABLET | ORAL | Status: DC

## 2018-07-28 MED ORDER — TOCILIZUMAB 400 MG/20ML IV SOLN
4.0000 mg/kg | INTRAVENOUS | Status: DC
  Administered 2018-07-28: 316 mg via INTRAVENOUS
  Filled 2018-07-28: qty 15.8

## 2018-07-28 NOTE — Discharge Instructions (Signed)
Tocilizumab injection What is this medicine? TOCILIZUMAB (TOE si LIZ ue mab) is used to treat rheumatoid arthritis and juvenile idiopathic arthritis. It is also used to treat giant cell arteritis and cytokine release syndrome. This medicine may be used for other purposes; ask your health care provider or pharmacist if you have questions. COMMON BRAND NAME(S): Actemra What should I tell my health care provider before I take this medicine? They need to know if you have any of these conditions:  cancer  diabetes  heart disease  hepatitis B or history of hepatitis B infection  high blood pressure  high cholesterol  immune system problems  infection (especially a virus infection such as chickenpox, cold sores, or herpes)  liver disease  low blood counts, like low white cell, platelet, or red cell counts  multiple sclerosis  recently received or scheduled to receive a vaccination  scheduled to have surgery  stomach or intestine problems  stroke  tuberculosis, a positive skin test for tuberculosis, or have recently been in close contact with someone who has tuberculosis  an unusual or allergic reaction to tocilizumab, other medicines, foods, dyes, or preservatives  pregnant or trying to get pregnant  breast-feeding How should I use this medicine? This medicine is for infusion into a vein or for injection under the skin. It is usually given by a health care professional in a hospital or clinic setting. If you get this medicine at home, you will be taught how to prepare and give this medicine. Use exactly as directed. Take your medicine at regular intervals. Do not take your medicine more often than directed. It is important that you put your used needles and syringes in a special sharps container. Do not put them in a trash can. If you do not have a sharps container, call your pharmacist or healthcare provider to get one. A special MedGuide will be given to you by the  pharmacist with each prescription and refill. Be sure to read this information carefully each time. Talk to your pediatrician regarding the use of this medicine in children. While the drug may be prescribed for children as young as 2 years for selected conditions, precautions do apply. Overdosage: If you think you have taken too much of this medicine contact a poison control center or emergency room at once. NOTE: This medicine is only for you. Do not share this medicine with others. What if I miss a dose? It is important not to miss your dose. Call your doctor or health care professional if you are unable to keep an appointment. If you give yourself the medicine and you miss a dose, take it as soon as you can. If it is almost time for your next dose, take only that dose. Do not take double or extra doses. What may interact with this medicine? Do not take this medicine with any of the following medications:  live virus vaccines This medicine may also interact with the following medications:  biologic medicines such as abatacept, adalimumab, anakinra, certolizumab, etanercept, golimumab, infliximab, rituximab, secukinumab, ustekinumab  birth control pills  certain medicines for cholesterol like atorvastatin, lovastatin, and simvastatin  cyclosporine  omeprazole  steroid medicines like prednisone or cortisone  theophylline  vaccines  warfarin This list may not describe all possible interactions. Give your health care provider a list of all the medicines, herbs, non-prescription drugs, or dietary supplements you use. Also tell them if you smoke, drink alcohol, or use illegal drugs. Some items may interact with your medicine.  What should I watch for while using this medicine? Your condition will be monitored carefully while you are receiving this medicine. Tell your doctor or healthcare professional if your symptoms do not start to get better or if they get worse. You may need blood work  done while you are taking this medicine. You will be tested for tuberculosis (TB) before you start this medicine. If your doctor prescribes any medicine for TB, you should start taking the TB medicine before starting this medicine. Make sure to finish the full course of TB medicine. This medicine may increase your risk of getting an infection. Call your doctor or health care professional for advice if you get a fever, chills or sore throat, or other symptoms of a cold or flu. Do not treat yourself. Try to avoid being around people who are sick. Talk to your doctor about your risk of cancer. You may be more at risk for certain types of cancers if you take this medicine. What side effects may I notice from receiving this medicine? Side effects that you should report to your doctor or health care professional as soon as possible:  allergic reactions like skin rash, itching or hives, swelling of the face, lips, or tongue  breathing problems  changes in vision  feeling faint or lightheaded, falls  high blood pressure  signs and symptoms of infection like fever or chills; cough; sore throat; pain or trouble passing urine  signs and symptoms of liver injury like dark yellow or brown urine; general ill feeling or flu-like symptoms; light-colored stools; loss of appetite; nausea; right upper belly pain; unusually weak or tired; yellowing of the eyes or skin  stomach pain  tingling, numbness in the hands or feet  unusual bleeding or bruising  unusually weak or tired Side effects that usually do not require medical attention (report to your doctor or health care professional if they continue or are bothersome):  dizziness  headache  pain, redness, or irritation at site where injected This list may not describe all possible side effects. Call your doctor for medical advice about side effects. You may report side effects to FDA at 1-800-FDA-1088. Where should I keep my medicine? Keep out of  the reach of children. If you are using this medicine at home, you will be instructed on how to store this medicine. Throw away any unused medicine after the expiration date on the label. NOTE: This sheet is a summary. It may not cover all possible information. If you have questions about this medicine, talk to your doctor, pharmacist, or health care provider.  2020 Elsevier/Gold Standard (2018-03-30 20:51:04)

## 2018-07-29 DIAGNOSIS — E039 Hypothyroidism, unspecified: Secondary | ICD-10-CM | POA: Diagnosis not present

## 2018-07-29 DIAGNOSIS — E1165 Type 2 diabetes mellitus with hyperglycemia: Secondary | ICD-10-CM | POA: Diagnosis not present

## 2018-07-29 DIAGNOSIS — E1142 Type 2 diabetes mellitus with diabetic polyneuropathy: Secondary | ICD-10-CM | POA: Diagnosis not present

## 2018-07-29 DIAGNOSIS — Z794 Long term (current) use of insulin: Secondary | ICD-10-CM | POA: Diagnosis not present

## 2018-08-06 DIAGNOSIS — Z961 Presence of intraocular lens: Secondary | ICD-10-CM | POA: Diagnosis not present

## 2018-08-06 DIAGNOSIS — H3581 Retinal edema: Secondary | ICD-10-CM | POA: Diagnosis not present

## 2018-08-06 DIAGNOSIS — H40003 Preglaucoma, unspecified, bilateral: Secondary | ICD-10-CM | POA: Diagnosis not present

## 2018-08-06 DIAGNOSIS — E119 Type 2 diabetes mellitus without complications: Secondary | ICD-10-CM | POA: Diagnosis not present

## 2018-08-10 ENCOUNTER — Ambulatory Visit: Payer: Self-pay | Admitting: Physician Assistant

## 2018-08-11 DIAGNOSIS — N644 Mastodynia: Secondary | ICD-10-CM | POA: Diagnosis not present

## 2018-08-17 NOTE — Progress Notes (Signed)
**Pt left prior to being seen by MD due to inability to perform fluorescein angiogram**    [facility] Note  08/18/2018     CHIEF COMPLAINT Patient presents for Retina Evaluation   HISTORY OF PRESENT ILLNESS: [name] is a 78 y.o. female who presents to the clinic today for:   HPI    Retina Evaluation    In both eyes.  This started 2 months ago.  Duration of 2 months.  Context:  distance vision and near vision.          Comments    BS: 126 this AM Last HgA1c: 7.1  Hx of cataract surgery: OD(12/04/16), OS(11/13/16) Hx of Yag PC: OD(04/27/17), OS(03/27/17)  Patient states her vision has been getting worse over the last two months.  Patient only wears glasses for reading.  Patient has occasional eye pain in her right eye only.  Patient has occasional floaters in her left eye--denies floaters OD.  Patient has occasional flashes of light OU.       Last edited by Doneen Poisson on 08/18/2018  8:33 AM. (History)      Referring physician: Feliz Beam, MD [place],  [place] [id number]  HISTORICAL INFORMATION:   Selected notes from the MEDICAL RECORD NUMBER Referred by Dr. 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 (Ophthalmic Drugs)   No current facility-administered medications for this visit.  (Ophthalmic Drugs)   Current Outpatient Medications (Other)  Medication Sig  . Abatacept (ORENCIA IV) Inject 750 mg into the vein See admin instructions. Abatacept (ORENCIA) 750 mg in sodium chloride 0.9 % 100 mL IVPB every 28 days  . acetaminophen (TYLENOL) 500 MG tablet Take 1,000 mg by mouth every 6 (six)  hours as needed for mild pain.  Marland Kitchen allopurinol (ZYLOPRIM) 300 MG tablet Take 1 tablet (300 mg total) by mouth daily.  Marland Kitchen aspirin EC 81 MG EC tablet Take 1 tablet (81 mg total) by mouth daily.  Marland Kitchen atorvastatin (LIPITOR) 80 MG tablet Take 80 mg by mouth daily at 6 PM.   . BD INSULIN SYRINGE ULTRAFINE 31G X 15/64" 0.3 ML MISC   . cholecalciferol (VITAMIN D) 1000 UNITS tablet Take 1,000 Units by mouth daily.   . clotrimazole-betamethasone (LOTRISONE) cream Apply 1 application topically as needed.   . cyclobenzaprine (FLEXERIL) 10 MG tablet Take 10 mg by mouth daily as needed for muscle spasms.   . ferrous sulfate (FER-IN-SOL) 75 (15 Fe) MG/ML SOLN Take 45 mg of iron by mouth daily.  . folic acid (FOLVITE) 1 MG tablet Take 1 mg by mouth daily.  Marland Kitchen HYDROcodone-acetaminophen (NORCO) 5-325 MG tablet Take 1 tablet by mouth every 6 (six) hours as needed for moderate pain.  Marland Kitchen levothyroxine (SYNTHROID, LEVOTHROID) 200 MCG tablet Take 1 tablet (200 mcg total) by mouth daily at 6 (six) AM.  . NOVOLIN 70/30 RELION (70-30) 100 UNIT/ML injection INJECT 6 TO 8 UNITS SUBCUTANEOUSLY THREE TIMES DAILY WITH MEALS  . pregabalin (LYRICA) 75 MG capsule 1 bid  . pyridOXINE (VITAMIN B-6) 100 MG tablet Take 100 mg by mouth daily.  Marland Kitchen VITAMIN E PO Take 1 capsule by mouth daily.    No current  facility-administered medications for this visit.  (Other)      REVIEW OF SYSTEMS: ROS    Positive for: Endocrine, Eyes   Negative for: Constitutional, Gastrointestinal, Neurological, Skin, Genitourinary, Musculoskeletal, HENT, Cardiovascular, Respiratory, Psychiatric, Allergic/Imm, Heme/Lymph   Last edited by Doneen Poisson on 08/18/2018  8:33 AM. (History)       ALLERGIES Allergies  Allergen Reactions  . Methotrexate Derivatives Other (See Comments)    Elevated creat. Levels  . Other Other (See Comments)    NO Blood products.  [name]- causes low blood sugar (pt states she was taking medication but cant remember  what it was for.)  . Penicillins Rash    Has patient had a PCN reaction causing immediate rash, facial/tongue/throat swelling, SOB or lightheadedness with hypotension: Yes Has patient had a PCN reaction causing severe rash involving mucus membranes or skin necrosis: Yes  Has patient had a PCN reaction that required hospitalization No Has patient had a PCN reaction occurring within the last 10 years: No If all of the above answers are "NO", then may proceed with Cephalosporin use.   . Remicade [Infliximab] Other (See Comments)    Inadequate response   . Zocor [Simvastatin] Other (See Comments)    Leg cramps     PAST MEDICAL HISTORY Past Medical History:  Diagnosis Date  . Allergic rhinitis   . Anemia   . Aortic stenosis 12/22/2012   Mild 3/14-2.35 m/s peak velocity  . Cataract 2018  . CHF (congestive heart failure) ([place])   . Chronic pain   . CKD (chronic kidney disease), stage III ([place])   . Diabetes mellitus   . Diverticulosis   . DVT (deep venous thrombosis) ([place])   . Glaucoma   . Hemorrhoids    internal  . HTN (hypertension)   . Hypercholesteremia   . Hyperlipidemia 12/22/2012  . Hypothyroidism   . Meningioma ([place]) 2008  . Osteoarthritis   . Peripheral neuropathy   . PPD positive    6 months ago  . PVC (premature ventricular contraction) 12/22/2012  . Rheumatoid arthritis(714.0)   . Sickle cell trait ([place])   . 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 ABDOMINAL HYSTERECTOMY  1978   TAH,& BSO 1  . CATARACT EXTRACTION    . fistula repair  03/2010   partial colectomy  . intracranial surgery  11/2007   removal of meningioma  . JOINT REPLACEMENT  2001   left TKR  . LEFT HEART CATHETERIZATION WITH CORONARY ANGIOGRAM N/A 08/08/2013   Procedure: LEFT HEART CATHETERIZATION WITH CORONARY ANGIOGRAM;  Surgeon: Peter M Martinique, MD;  Location: [facility] CATH LAB;  Service:  Cardiovascular;  Laterality: N/A;  . ROTATOR CUFF REPAIR  1999   BIlateral  . SIGMOIDECTOMY  2012  . TOE SURGERY Left 2018  . TONSILLECTOMY AND ADENOIDECTOMY    . TOTAL THYROIDECTOMY  1992  . wrist sx     right     FAMILY HISTORY Family History  Problem Relation Age of Onset  . Hypotension Mother   . CVA Mother   . Diabetes Mother   . Hypertension Father   . Kidney failure Father     SOCIAL HISTORY Social History   Tobacco Use  . Smoking status: Former Smoker    Packs/day: 0.10    Years: 2.00    Pack years: 0.20    Types: Cigarettes  Quit date: 01/06/1962    Years since quitting: 56.6  . Smokeless tobacco: Never Used  . Tobacco comment: Socially   Substance Use Topics  . Alcohol use: Yes    Comment: occasionally  . Drug use: No         OPHTHALMIC EXAM:  Base Eye Exam    Visual Acuity (Snellen - Linear)      Right Left   Dist [place] 20/25 -3 20/20 -3   Dist ph [place] NI        Tonometry (Tonopen, 8:44 AM)      Right Left   Pressure 14 14       Pupils      Dark Light Shape React APD   Right 2 1 Round Minimal 0   Left 2 1 Round Minimal 0       Visual Fields      Left Right     Full   Restrictions Partial outer superior temporal, inferior temporal deficiencies        Extraocular Movement      Right Left    Full Full       Neuro/Psych    Oriented x3: Yes   Mood/Affect: Normal       Dilation    Both eyes: 1.0% Mydriacyl, 2.5% Phenylephrine @ 8:44 AM        Refraction    Wearing Rx      Sphere   Right OTC   Left OTC       Manifest Refraction      Sphere Cylinder Axis Dist VA   Right -0.50 +0.50 005 20/25   Left +0.00   20/20-2          IMAGING AND PROCEDURES  Imaging and Procedures for @TODAY @           ASSESSMENT/PLAN:    ICD-10-CM   1. Retinal edema  H35.81 OCT, Retina - OU - Both Eyes    1. Retinal Edema OD  - referred by Dr. Manuella Ghazi for FA -- concern for uveitis / inflammation  - unable to obtain FA today due to  poor IV access and poor hydration, after 5 attempts  - will bring back in one week, advised pt to aggressively hydrate prior to appt  2. Pseudophakia OU  - s/p CE/IOL (11/2016, Dr. Wyline Beady)  - beautiful surgery, doing well  - monitor   3. Glaucoma Suspect  4. 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      No follow-ups on file.  There are no Patient Instructions on file for this visit.   Explained the diagnoses, plan, and follow up with the patient and they expressed understanding.  Patient expressed understanding of the importance of proper follow up care.   This document serves as a record of services personally performed by Gardiner Sleeper, MD, PhD. It was created on their behalf by Ernest Mallick, OA, an ophthalmic assistant. The creation of this record is the provider's dictation and/or activities during the visit.    Electronically signed by: Ernest Mallick, OA  08.11.2020 8:45 AM    Gardiner Sleeper, M.D., Ph.D. Diseases & Surgery of the Retina and Vitreous [facility]  I have reviewed the above documentation for accuracy and completeness, and I agree with the above. Gardiner Sleeper, M.D., Ph.D. 08/19/18 10:24 AM  Abbreviations: M myopia (nearsighted); A astigmatism; H hyperopia (farsighted); P presbyopia; Mrx spectacle prescription;  CTL contact lenses; OD right eye; OS left eye; OU both eyes  XT exotropia; ET esotropia; PEK punctate epithelial keratitis; PEE punctate epithelial erosions; DES dry eye syndrome; MGD meibomian gland dysfunction; ATs artificial tears; PFAT's preservative free artificial tears; [place] nuclear sclerotic cataract; PSC posterior subcapsular cataract; ERM epi-retinal membrane; PVD posterior vitreous detachment; RD retinal detachment; DM diabetes mellitus; DR diabetic retinopathy; NPDR non-proliferative  diabetic retinopathy; PDR proliferative diabetic retinopathy; CSME clinically significant macular edema; DME diabetic macular edema; dbh dot blot hemorrhages; CWS cotton wool spot; POAG primary open angle glaucoma; C/D cup-to-disc ratio; HVF humphrey visual field; GVF goldmann visual field; OCT optical coherence tomography; IOP intraocular pressure; BRVO Branch retinal vein occlusion; CRVO central retinal vein occlusion; CRAO central retinal artery occlusion; BRAO branch retinal artery occlusion; RT retinal tear; SB scleral buckle; PPV pars plana vitrectomy; VH Vitreous hemorrhage; PRP panretinal laser photocoagulation; IVK intravitreal kenalog; VMT vitreomacular traction; MH Macular hole;  NVD neovascularization of the disc; NVE neovascularization elsewhere; AREDS age related eye disease study; ARMD age related macular degeneration; POAG primary open angle glaucoma; EBMD epithelial/anterior basement membrane dystrophy; ACIOL anterior chamber intraocular lens; IOL intraocular lens; PCIOL posterior chamber intraocular lens; Phaco/IOL phacoemulsification with intraocular lens placement; [place] photorefractive keratectomy; LASIK laser assisted in situ keratomileusis; HTN hypertension; DM diabetes mellitus; COPD chronic obstructive pulmonary disease

## 2018-08-18 ENCOUNTER — Encounter (INDEPENDENT_AMBULATORY_CARE_PROVIDER_SITE_OTHER): Payer: Medicare Other | Admitting: Ophthalmology

## 2018-08-18 ENCOUNTER — Encounter (INDEPENDENT_AMBULATORY_CARE_PROVIDER_SITE_OTHER): Payer: Self-pay

## 2018-08-18 ENCOUNTER — Other Ambulatory Visit: Payer: Self-pay

## 2018-08-18 ENCOUNTER — Ambulatory Visit (INDEPENDENT_AMBULATORY_CARE_PROVIDER_SITE_OTHER): Payer: Medicare Other | Admitting: Ophthalmology

## 2018-08-18 ENCOUNTER — Encounter (INDEPENDENT_AMBULATORY_CARE_PROVIDER_SITE_OTHER): Payer: Self-pay | Admitting: Ophthalmology

## 2018-08-18 DIAGNOSIS — H3581 Retinal edema: Secondary | ICD-10-CM | POA: Diagnosis not present

## 2018-08-19 NOTE — Progress Notes (Signed)
[facility] Note  08/20/2018     CHIEF COMPLAINT Patient presents for Retina Evaluation   HISTORY OF PRESENT ILLNESS: [name] is a 78 y.o. female who presents to the clinic today for:   HPI    Retina Evaluation    In right eye.  Onset: Unknown.  Duration of days.  Context:  distance vision.  Treatments tried include no treatments.  I, the attending physician,  performed the HPI with the patient and updated documentation appropriately.          Comments    55 female pt referred by Dr. Dwana [name] for FA and eval of retinal edema OD/concern for uveitis/inflammation.  Pt was here a couple of days ago, but was unable to complete FA due to poor IV access.  No change in [place] OU.  Denies pain, flashes, floaters.  No gtts.       Last edited by Julia Caffey, MD on 08/20/2018  3:26 PM. (History)    pt states she is on actremra for RA, pt states she has had cataract sx on both eyes and glaucoma sx on her left eye  Referring physician: Feliz Beam, MD [place],  [place] [address]  HISTORICAL INFORMATION:   Selected notes from the MEDICAL RECORD NUMBER Referred by Dr. Dwana [name] for FA LEE: 07.31.20 [name]) [BCVA: OD: 20/30, OS: 20/30+2] Ocular Hx-retinal edema, pseudo OU (2018 - [name]), glaucoma suspect, s/p YAG OU (2019 - [name]) PMH-DM (last A1c: 6.5 (02.18), takes novolin), aortic stenosis, asthma, HTN, meningioma, osteoarthritis, RA, TIA   CURRENT MEDICATIONS: No current outpatient medications on file. (Ophthalmic Drugs)   No current facility-administered medications for this visit.  (Ophthalmic Drugs)   Current Outpatient Medications (Other)  Medication Sig  . Abatacept (ORENCIA IV) Inject 750 mg into the vein See admin instructions. Abatacept (ORENCIA) 750 mg in sodium chloride 0.9 % 100 mL IVPB every 28 days  . acetaminophen (TYLENOL) 500 MG tablet Take 1,000 mg by mouth every 6 (six) hours as needed  for mild pain.  Marland Kitchen aspirin EC 81 MG EC tablet Take 1 tablet (81 mg total) by mouth daily.  Marland Kitchen atorvastatin (LIPITOR) 80 MG tablet Take 80 mg by mouth daily at 6 PM.   . BD INSULIN SYRINGE ULTRAFINE 31G X 15/64" 0.3 ML MISC   . cholecalciferol (VITAMIN D) 1000 UNITS tablet Take 1,000 Units by mouth daily.   . clotrimazole-betamethasone (LOTRISONE) cream Apply 1 application topically as needed.   . cyclobenzaprine (FLEXERIL) 10 MG tablet Take 10 mg by mouth daily as needed for muscle spasms.   . ferrous sulfate (FER-IN-SOL) 75 (15 Fe) MG/ML SOLN Take 45 mg of iron by mouth daily.  . folic acid (FOLVITE) 1 MG tablet Take 1 mg by mouth daily.  Marland Kitchen HYDROcodone-acetaminophen (NORCO) 5-325 MG tablet Take 1 tablet by mouth every 6 (six) hours as needed for moderate pain.  Marland Kitchen levothyroxine (SYNTHROID, LEVOTHROID) 200 MCG tablet Take 1 tablet (200 mcg total) by mouth daily at 6 (six) AM.  . losartan (COZAAR) 50 MG tablet Take 50 mg by mouth daily.  Marland Kitchen NOVOLIN 70/30 RELION (70-30) 100 UNIT/ML injection INJECT 6 TO 8 UNITS SUBCUTANEOUSLY THREE TIMES DAILY WITH MEALS  . NYSTATIN powder APPLY AS DIRECTED TOPICALLY TWICE DAILY AS NEEDED 7 DAYS  . pregabalin (LYRICA) 75 MG capsule 1 bid  . pyridOXINE (VITAMIN B-6) 100 MG tablet Take 100 mg by mouth daily.  Marland Kitchen  VITAMIN E PO Take 1 capsule by mouth daily.   Marland Kitchen allopurinol (ZYLOPRIM) 300 MG tablet Take 1 tablet (300 mg total) by mouth daily.   No current facility-administered medications for this visit.  (Other)      REVIEW OF SYSTEMS: ROS    Positive for: Neurological, Genitourinary, Musculoskeletal, Cardiovascular, Eyes   Negative for: Constitutional, Gastrointestinal, Skin, HENT, Endocrine, Respiratory, Psychiatric, Allergic/Imm, Heme/Lymph   Last edited by [name], COA on 08/20/2018  1:19 PM. (History)       ALLERGIES Allergies  Allergen Reactions  . Methotrexate Derivatives Other (See Comments)    Elevated creat. Levels  . Other Other (See  Comments)    NO Blood products.  [name]- causes low blood sugar (pt states she was taking medication but cant remember what it was for.)  . Penicillins Rash    Has patient had a PCN reaction causing immediate rash, facial/tongue/throat swelling, SOB or lightheadedness with hypotension: Yes Has patient had a PCN reaction causing severe rash involving mucus membranes or skin necrosis: Yes  Has patient had a PCN reaction that required hospitalization No Has patient had a PCN reaction occurring within the last 10 years: No If all of the above answers are "NO", then may proceed with Cephalosporin use.   . Remicade [Infliximab] Other (See Comments)    Inadequate response   . Zocor [Simvastatin] Other (See Comments)    Leg cramps     PAST MEDICAL HISTORY Past Medical History:  Diagnosis Date  . Allergic rhinitis   . Anemia   . Aortic stenosis 12/22/2012   Mild 3/14-2.35 m/s peak velocity  . Cataract 2018  . CHF (congestive heart failure) ([place])   . Chronic pain   . CKD (chronic kidney disease), stage III ([place])   . Diabetes mellitus   . Diverticulosis   . DVT (deep venous thrombosis) ([place])   . Glaucoma   . Hemorrhoids    internal  . HTN (hypertension)   . Hypercholesteremia   . Hyperlipidemia 12/22/2012  . Hypothyroidism   . Meningioma ([place]) 2008  . Osteoarthritis   . Peripheral neuropathy   . PPD positive    6 months ago  . PVC (premature ventricular contraction) 12/22/2012  . Rheumatoid arthritis(714.0)   . Sickle cell trait ([place])   . 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 ABDOMINAL HYSTERECTOMY  1978   TAH,& BSO 1  . CATARACT EXTRACTION Bilateral   . EYE SURGERY    . fistula repair  03/2010   partial colectomy  . intracranial surgery  11/2007   removal of meningioma  . JOINT REPLACEMENT  2001   left TKR  . LEFT HEART CATHETERIZATION WITH CORONARY  ANGIOGRAM N/A 08/08/2013   Procedure: LEFT HEART CATHETERIZATION WITH CORONARY ANGIOGRAM;  Surgeon: Julia M Martinique, MD;  Location: [facility] CATH LAB;  Service: Cardiovascular;  Laterality: N/A;  . ROTATOR CUFF REPAIR  1999   BIlateral  . SIGMOIDECTOMY  2012  . TOE SURGERY Left 2018  . TONSILLECTOMY AND ADENOIDECTOMY    . TOTAL THYROIDECTOMY  1992  . wrist sx     right     FAMILY HISTORY Family History  Problem Relation Age of Onset  . Hypotension Mother   . CVA Mother   . Diabetes Mother   . Hypertension Father   . Kidney failure Father     SOCIAL  HISTORY Social History   Tobacco Use  . Smoking status: Former Smoker    Packs/day: 0.10    Years: 2.00    Pack years: 0.20    Types: Cigarettes    Quit date: 01/06/1962    Years since quitting: 56.6  . Smokeless tobacco: Never Used  . Tobacco comment: Socially   Substance Use Topics  . Alcohol use: Yes    Comment: occasionally  . Drug use: No         OPHTHALMIC EXAM:  Base Eye Exam    Visual Acuity (Snellen - Linear)      Right Left   Dist [place] 20/25 -2 20/20 -2   Dist ph [place] NI        Tonometry (Tonopen, 1:25 PM)      Right Left   Pressure 12 13       Pupils      Dark Light Shape React APD   Right 3 2 Round Minimal None   Left 3 2 Round Minimal None       Visual Fields (Counting fingers)      Left Right     Full   Restrictions Partial outer superior temporal, inferior temporal deficiencies        Extraocular Movement      Right Left    Full, Ortho Full, Ortho       Neuro/Psych    Oriented x3: Yes   Mood/Affect: Normal       Dilation    Both eyes: 1.0% Mydriacyl, 2.5% Phenylephrine @ 1:25 PM        Slit Lamp and Fundus Exam    Slit Lamp Exam      Right Left   Lids/Lashes Dermatochalasis - upper lid, mild Meibomian gland dysfunction Dermatochalasis - upper lid, mild Meibomian gland dysfunction   Conjunctiva/Sclera mild Melanosis mild Melanosis   Cornea Arcus, 1+ Punctate epithelial erosions, Well  healed cataract wounds Arcus, 1+ Punctate epithelial erosions, Well healed cataract wounds   Anterior Chamber Deep and quiet Deep and quiet   Iris Round and dilated Round and dilated   Lens PC IOL in good position with open PC PC IOL in good position with open PC   Vitreous Vitreous syneresis, trace Cell, Posterior vitreous detachment, vitreous condensations Vitreous syneresis       Fundus Exam      Right Left   Disc 360 PPP, +SVP, +cupping Pink and Sharp, +cupping, mild PPP, thin inferior rim, SVP superiorly   C/D Ratio 0.7 0.7   Macula Flat, Good foveal reflex, mild Retinal pigment epithelial mottling, trace Epiretinal membrane Flat, Blunted foveal reflex, mild Epiretinal membrane, No heme or edema   Vessels Vascular attenuation, Tortuous Vascular attenuation, Tortuous   Periphery Attached, scattered peripheral drusen, VR tuft at 0730, No heme  Attached, scattered peripheral drusen, No heme         Refraction    Manifest Refraction      Sphere Cylinder Axis Dist VA   Right -0.50 +0.50 005 20/25   Left [place] Sphere  20/20-2          IMAGING AND PROCEDURES  Imaging and Procedures for @TODAY @  Fluorescein Angiography Optos (Transit OD)       Right Eye   Progression has no prior data. Early phase findings include normal observations. Mid/Late phase findings include leakage, microaneurysm (Mild hyperfluorescent leakage temporal periphery, minimal MA).   Left Eye   Progression has no prior data. Early phase findings include normal observations.  Mid/Late phase findings include leakage, microaneurysm (Mild hyperfluorescent leakage temporal periphery, minimal MA).   Notes Images stored on drive;   Impression: Mild hyperfluorescent leakage temporal periphery, minimal MA OU                 ASSESSMENT/PLAN:    ICD-10-CM   1. Retinal edema  H35.81   2. 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 Rheumatoid arthritis, involving  unspecified site, unspecified rheumatoid factor presence ([place])  M06.9   4. Diabetes mellitus type 2 without retinopathy ([place])  E11.9   5. Essential hypertension  I10   6. Hypertensive retinopathy of both eyes  H35.033 Fluorescein Angiography Optos (Transit OD)  7. 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 Pseudophakia of both eyes  Z96.1     1-3. Retinal Edema - referred by Dr. Manuella Ghazi for FA -- concern for uveitis / inflammation in setting of history of RA on Actemera and history of TB - exam shows +vitreous debris OD, BCVA 20/25 OD, 20/20 OS - FA 8.14.2020 shows mild peripheral leakage OU, minimal MA/staining - images shared with Dr. Manuella Ghazi for his review and expert management - f/u here prn per Dr. Manuella Ghazi  4. Diabetes mellitus, type 2 without retinopathy - The incidence, risk factors for progression, natural history and treatment options for diabetic retinopathy  were discussed with patient.   - The need for close monitoring of blood glucose, blood pressure, and serum lipids, avoiding cigarette or any type of tobacco, and the need for long term follow up was also discussed with patient. - under the expert management of Dr. Manuella Ghazi  5,6. Hypertensive retinopathy OU - discussed importance of tight BP control - monitor  7. Glaucoma suspect - under the expert management of Dr. Andria Frames  8. Pseudophakia OU  - s/p CE/IOL OU (11/2016, Dr. Wyline Beady)  - beautiful surgeries, doing well  - monitor   Ophthalmic Meds Ordered this visit:  No orders of the defined types were placed in this encounter.      Return if symptoms worsen or fail to improve.  There are no Patient Instructions on file for this visit.   Explained the diagnoses, plan, and follow up with the patient and they expressed understanding.  Patient expressed understanding of the importance of proper follow up care.   This document serves as a record of services personally performed by Gardiner Sleeper, MD, PhD. It was created  on their behalf by Ernest Mallick, OA, an ophthalmic assistant. The creation of this record is the provider's dictation and/or activities during the visit.    Electronically signed by: Ernest Mallick, OA  08.13.2020 1:03 AM    Gardiner Sleeper, M.D., Ph.D. Diseases & Surgery of the Retina and Vitreous [facility]  I have reviewed the above documentation for accuracy and completeness, and I agree with the above. Gardiner Sleeper, M.D., Ph.D. 08/21/18 1:03 AM     Abbreviations: M myopia (nearsighted); A astigmatism; H hyperopia (farsighted); P presbyopia; Mrx spectacle prescription;  CTL contact lenses; OD right eye; OS left eye; OU both eyes  XT exotropia; ET esotropia; PEK punctate epithelial keratitis; PEE punctate epithelial erosions; DES dry eye syndrome; MGD meibomian gland dysfunction; ATs artificial tears; PFAT's preservative free artificial tears; [place] nuclear sclerotic cataract; PSC posterior subcapsular cataract; ERM epi-retinal membrane; PVD posterior vitreous detachment; RD retinal detachment; DM diabetes mellitus; DR diabetic retinopathy; NPDR non-proliferative diabetic retinopathy; PDR proliferative diabetic retinopathy; CSME  clinically significant macular edema; DME diabetic macular edema; dbh dot blot hemorrhages; CWS cotton wool spot; POAG primary open angle glaucoma; C/D cup-to-disc ratio; HVF humphrey visual field; GVF goldmann visual field; OCT optical coherence tomography; IOP intraocular pressure; BRVO Branch retinal vein occlusion; CRVO central retinal vein occlusion; CRAO central retinal artery occlusion; BRAO branch retinal artery occlusion; RT retinal tear; SB scleral buckle; PPV pars plana vitrectomy; VH Vitreous hemorrhage; PRP panretinal laser photocoagulation; IVK intravitreal kenalog; VMT vitreomacular traction; MH Macular hole;  NVD neovascularization of the disc; NVE neovascularization elsewhere; AREDS age related eye disease study; ARMD age related  macular degeneration; POAG primary open angle glaucoma; EBMD epithelial/anterior basement membrane dystrophy; ACIOL anterior chamber intraocular lens; IOL intraocular lens; PCIOL posterior chamber intraocular lens; Phaco/IOL phacoemulsification with intraocular lens placement; [place] photorefractive keratectomy; LASIK laser assisted in situ keratomileusis; HTN hypertension; DM diabetes mellitus; COPD chronic obstructive pulmonary disease

## 2018-08-20 ENCOUNTER — Encounter (INDEPENDENT_AMBULATORY_CARE_PROVIDER_SITE_OTHER): Payer: Self-pay | Admitting: Ophthalmology

## 2018-08-20 ENCOUNTER — Other Ambulatory Visit: Payer: Self-pay | Admitting: *Deleted

## 2018-08-20 ENCOUNTER — Other Ambulatory Visit: Payer: Self-pay

## 2018-08-20 ENCOUNTER — Ambulatory Visit (INDEPENDENT_AMBULATORY_CARE_PROVIDER_SITE_OTHER): Payer: Medicare Other | Admitting: Ophthalmology

## 2018-08-20 DIAGNOSIS — H30033 Focal chorioretinal inflammation, peripheral, bilateral: Secondary | ICD-10-CM | POA: Diagnosis not present

## 2018-08-20 DIAGNOSIS — H3581 Retinal edema: Secondary | ICD-10-CM

## 2018-08-20 DIAGNOSIS — E119 Type 2 diabetes mellitus without complications: Secondary | ICD-10-CM

## 2018-08-20 DIAGNOSIS — I10 Essential (primary) hypertension: Secondary | ICD-10-CM | POA: Diagnosis not present

## 2018-08-20 DIAGNOSIS — Z961 Presence of intraocular lens: Secondary | ICD-10-CM

## 2018-08-20 DIAGNOSIS — H35033 Hypertensive retinopathy, bilateral: Secondary | ICD-10-CM | POA: Diagnosis not present

## 2018-08-20 DIAGNOSIS — M069 Rheumatoid arthritis, unspecified: Secondary | ICD-10-CM | POA: Diagnosis not present

## 2018-08-20 DIAGNOSIS — H40003 Preglaucoma, unspecified, bilateral: Secondary | ICD-10-CM | POA: Diagnosis not present

## 2018-08-20 NOTE — Progress Notes (Signed)
Infusion orders are current for patient CBC CMP Tylenol Benadryl appointments are up to date and follow up appointment  is scheduled yearly chest x-ray not due yet.

## 2018-08-24 ENCOUNTER — Encounter (INDEPENDENT_AMBULATORY_CARE_PROVIDER_SITE_OTHER): Payer: Medicare Other | Admitting: Ophthalmology

## 2018-08-25 ENCOUNTER — Other Ambulatory Visit: Payer: Self-pay

## 2018-08-25 ENCOUNTER — Ambulatory Visit (HOSPITAL_COMMUNITY)
Admission: RE | Admit: 2018-08-25 | Discharge: 2018-08-25 | Disposition: A | Discharge status: Home / Self Care | Payer: Medicare Other | Source: Ambulatory Visit | Attending: Rheumatology | Admitting: Rheumatology

## 2018-08-25 DIAGNOSIS — M0609 Rheumatoid arthritis without rheumatoid factor, multiple sites: Secondary | ICD-10-CM | POA: Insufficient documentation

## 2018-08-25 LAB — COMPREHENSIVE METABOLIC PANEL
ALT: 13 U/L (ref 0–44)
AST: 22 U/L (ref 15–41)
Albumin: 3.8 g/dL (ref 3.5–5.0)
Alkaline Phosphatase: 73 U/L (ref 38–126)
Anion gap: 10 (ref 5–15)
BUN: 16 mg/dL (ref 8–23)
CO2: 24 mmol/L (ref 22–32)
Calcium: 9.6 mg/dL (ref 8.9–10.3)
Chloride: 109 mmol/L (ref 98–111)
Creatinine, Ser: 1.46 mg/dL — ABNORMAL HIGH (ref 0.44–1.00)
GFR calc Af Amer: 39 mL/min — ABNORMAL LOW (ref >60)
GFR calc non Af Amer: 34 mL/min — ABNORMAL LOW (ref >60)
Glucose, Bld: 187 mg/dL — ABNORMAL HIGH (ref 70–99)
Potassium: 4.2 mmol/L (ref 3.5–5.1)
Sodium: 143 mmol/L (ref 135–145)
Total Bilirubin: 1 mg/dL (ref 0.3–1.2)
Total Protein: 7.1 g/dL (ref 6.5–8.1)

## 2018-08-25 LAB — CBC
HCT: 32.4 % — ABNORMAL LOW (ref 36.0–46.0)
Hemoglobin: 10.3 g/dL — ABNORMAL LOW (ref 12.0–15.0)
MCH: 28.7 pg (ref 26.0–34.0)
MCHC: 31.8 g/dL (ref 30.0–36.0)
MCV: 90.3 fL (ref 80.0–100.0)
Platelets: 279 10*3/uL (ref 150–400)
RBC: 3.59 MIL/uL — ABNORMAL LOW (ref 3.87–5.11)
RDW: 16.9 % — ABNORMAL HIGH (ref 11.5–15.5)
WBC: 8.2 10*3/uL (ref 4.0–10.5)
nRBC: 0 % (ref 0.0–0.2)

## 2018-08-25 MED ORDER — ACETAMINOPHEN 325 MG PO TABS: 650.0000 mg | ORAL_TABLET | ORAL | Status: DC

## 2018-08-25 MED ORDER — DIPHENHYDRAMINE HCL 25 MG PO CAPS: 25.0000 mg | ORAL_CAPSULE | ORAL | Status: DC

## 2018-08-25 MED ORDER — TOCILIZUMAB 400 MG/20ML IV SOLN
4.0000 mg/kg | INTRAVENOUS | Status: DC
  Administered 2018-08-25: 324 mg via INTRAVENOUS
  Filled 2018-08-25: qty 16.2

## 2018-09-02 ENCOUNTER — Encounter: Payer: Self-pay | Admitting: Rheumatology

## 2018-09-02 ENCOUNTER — Telehealth: Payer: Self-pay | Admitting: Pharmacist

## 2018-09-02 ENCOUNTER — Other Ambulatory Visit: Payer: Self-pay

## 2018-09-02 ENCOUNTER — Ambulatory Visit (INDEPENDENT_AMBULATORY_CARE_PROVIDER_SITE_OTHER): Payer: Medicare Other | Admitting: Rheumatology

## 2018-09-02 VITALS — BP 143/62 | HR 60 | Resp 15 | Ht 62.0 in | Wt 180.8 lb

## 2018-09-02 DIAGNOSIS — N183 Chronic kidney disease, stage 3 unspecified: Secondary | ICD-10-CM

## 2018-09-02 DIAGNOSIS — Z8639 Personal history of other endocrine, nutritional and metabolic disease: Secondary | ICD-10-CM

## 2018-09-02 DIAGNOSIS — R7612 Nonspecific reaction to cell mediated immunity measurement of gamma interferon antigen response without active tuberculosis: Secondary | ICD-10-CM

## 2018-09-02 DIAGNOSIS — M1A09X0 Idiopathic chronic gout, multiple sites, without tophus (tophi): Secondary | ICD-10-CM

## 2018-09-02 DIAGNOSIS — Z79899 Other long term (current) drug therapy: Secondary | ICD-10-CM

## 2018-09-02 DIAGNOSIS — M1711 Unilateral primary osteoarthritis, right knee: Secondary | ICD-10-CM

## 2018-09-02 DIAGNOSIS — R911 Solitary pulmonary nodule: Secondary | ICD-10-CM | POA: Diagnosis not present

## 2018-09-02 DIAGNOSIS — M0609 Rheumatoid arthritis without rheumatoid factor, multiple sites: Secondary | ICD-10-CM

## 2018-09-02 DIAGNOSIS — M0579 Rheumatoid arthritis with rheumatoid factor of multiple sites without organ or systems involvement: Secondary | ICD-10-CM

## 2018-09-02 DIAGNOSIS — Z96652 Presence of left artificial knee joint: Secondary | ICD-10-CM | POA: Diagnosis not present

## 2018-09-02 DIAGNOSIS — I5032 Chronic diastolic (congestive) heart failure: Secondary | ICD-10-CM | POA: Diagnosis not present

## 2018-09-02 DIAGNOSIS — M5136 Other intervertebral disc degeneration, lumbar region: Secondary | ICD-10-CM | POA: Diagnosis not present

## 2018-09-02 DIAGNOSIS — Z8679 Personal history of other diseases of the circulatory system: Secondary | ICD-10-CM | POA: Diagnosis not present

## 2018-09-02 NOTE — Telephone Encounter (Signed)
Please start BIV for Rinvoq 15 mg daily.  She is not a candidate for anti-TNF's due to her history of congestive heart failure.  She had an inadequate response to Plaquenil, Enbrel, Remicade, and Orencia in the past.  She will discontinue Actemra due to developing a rash.  Jolee Ewing was discontinued in the past due to cost.  She discontinued methotrexate due to elevated creatinine and LFTs in the past.   She will likely need patient assistance.  Patient filled out PAP paperwork in office and is to bring supporting income documents.  Will fax once documents are received.    Mariella Saa, PharmD, [place], [place] Clinical Specialty Pharmacist [phone]  09/02/2018 12:36 PM

## 2018-09-02 NOTE — Telephone Encounter (Signed)
Received notification from [facility] regarding a prior authorization for [facility]. Authorization has been APPROVED from 08/03/18 to 09/02/19.   Will send document to scan center.  Authorization #  Rep advised it's still being generated, will take up to 24 hours to up load in the system. Rep advised patient will have to fill through [employer].  Phone # [phone] Rep# Alinda Money  1:41 PM Beatriz Chancellor, CPhT

## 2018-09-02 NOTE — Progress Notes (Signed)
Pharmacy Note  Subjective: Patient presents today to the [facility] to see Dr. Estanislado Pandy.  Patient seen by the pharmacist for counseling on Rinvoq for rheumatoid arthritis. She is not a candidate for anti-TNF's due to her history of congestive heart failure.  She had an inadequate response to Plaquenil, Enbrel, Remicade, and Orencia in the past.  She will discontinue Actemra due to developing a rash.  Jolee Ewing was discontinued in the past due to cost.  She discontinued methotrexate due to elevated creatinine and LFTs in the past.  Objective:  CMP     Component Value Date/Time   NA 143 08/25/2018 1135   K 4.2 08/25/2018 1135   CL 109 08/25/2018 1135   CO2 24 08/25/2018 1135   GLUCOSE 187 (H) 08/25/2018 1135   BUN 16 08/25/2018 1135   CREATININE 1.46 (H) 08/25/2018 1135   CALCIUM 9.6 08/25/2018 1135   PROT 7.1 08/25/2018 1135   ALBUMIN 3.8 08/25/2018 1135   AST 22 08/25/2018 1135   ALT 13 08/25/2018 1135   ALKPHOS 73 08/25/2018 1135    CBC    Component Value Date/Time   WBC 8.2 08/25/2018 1135   RBC 3.59 (L) 08/25/2018 1135   HGB 10.3 (L) 08/25/2018 1135   HGB 10.2 (L) 03/12/2010 1102   HCT 32.4 (L) 08/25/2018 1135   HCT 31.2 (L) 03/12/2010 1102   PLT 279 08/25/2018 1135   PLT 481 (H) 03/12/2010 1102   MCV 90.3 08/25/2018 1135   MCV 81.9 03/12/2010 1102   MCH 28.7 08/25/2018 1135   MCHC 31.8 08/25/2018 1135   RDW 16.9 (H) 08/25/2018 1135   RDW 18.9 (H) 03/12/2010 1102    Baseline Immunosuppressant Therapy Labs  TB GOLD: history of TB. Will monitor chest x-ray yearly.  Last chest x-ray normal 02/10/2018.  Hepatitis Panel: negative 02/27/2005  HIV: not covered under medicare; negative per patient  Immunoglobulins Immunoglobulin Electrophoresis Latest Ref Rng & Units 12/12/2009  IgG 694 - 1618 mg/dL 1300  IgM 60 - 263 mg/dL 76   SPEP Serum Protein Electrophoresis Latest Ref Rng & Units 08/25/2018  Total Protein 6.5 - 8.1 g/dL 7.1  Albumin 2.9 - 4.4 g/dL  -  Alpha-1 0.0 - 0.4 g/dL -  Alpha-2 0.4 - 1.0 g/dL -  Beta Globulin 0.7 - 1.3 g/dL -  Beta 2 3.2 - 6.5 % -  Gamma Globulin 0.4 - 1.8 g/dL -  Interpretation - -   G6PD No results found for: G6PDH TPMT No results found for: TPMT   Lipid Panel Lab Results  Component Value Date   CHOL 179 02/28/2016   HDL 47 02/28/2016   LDLCALC 107 (H) 02/28/2016   TRIG 123 02/28/2016   CHOLHDL 3.8 02/28/2016     Does patient have history of diverticulitis?  No  Assessment/Plan:  Discussed starting a JAK inhibitor and  Rinvoq was chosen due her kidney function.   Counseled patient that Rinvoq is a JAK inhibitor indicated for Rheumatoid Arthritis.  Counseled patient on purpose, proper use, and adverse effects of Rinvoq.    Reviewed the most common adverse effects including infection, diarrhea, headaches.  Also reviewed rare adverse effects such as bowel injury and the need to contact us if they develop stomach pain during treatment. Counseled on the increase risk of venous thrombosis. Reviewed with patient that there is the possibility of an increased risk of malignancy but it is not well understood if this increased risk is due to the medication or the disease state.  Instructed patient that medication should be held for infection and prior to surgery.  Advised patient to avoid live vaccines. Recommend annual influenza, Pneumovax 23, Prevnar 13, and Shingrix as indicated.    Reviewed the importance of routine lab monitoring including lipid panel.  Standing orders placed. Provided patient with medication education material and answered all questions.  Patient consented to Rinvoq.  Will upload into patient's chart.  Will apply through patient's insurance and update when we receive a response.  Patient dose will be 15 mg daily.  Prescription will be sent to pharmacy pending lab results and insurance approval.  She will likely need patient assistance.  Patient filled out PAP paperwork in office and is to  bring supporting income documents.  Will fax once documents are received.   All questions encouraged and answered.  Instructed patient to call with any questions or concerns.  Mariella Saa, PharmD, [place], [place] Clinical Specialty Pharmacist [phone]  09/02/2018 12:32 PM

## 2018-09-02 NOTE — Progress Notes (Signed)
Office Visit Note  Patient: [name]             Date of Birth: Mar 05, 1940           MRN: [id number]             PCP: Jonathon Jordan, MD Referring: Jonathon Jordan, MD Visit Date: 09/02/2018 Occupation: @GUAROCC @  Subjective:  Rash   History of Present Illness: EVELLA KASAL is a 78 y.o. female with history of seronegative rheumatoid arthritis, gout, and osteoarthritis. She has received 2 Actemra infusions since switching from Orencia infusions.  She states that after the last infusion she developed a rash 4 to 5 days later.  She denies any new skin products, detergents, foods, or changes in medications besides Actemra.  She has not taken any Benadryl or anti-itch ointment.  She states that the rash is primarily on her arms and legs.  She has not noticed any improvement since starting on Actemra.  She continues to have pain in multiple joints including the right shoulder, right elbow, bilateral wrists, both hands, right knee, and right ankle joint.  She has swelling in the hands wrists and right knee.  She is been walking with a cane.  She denies any recent gout flares.She takes allopurinol 300 mg 1 tablet by mouth daily.  She takes lyrica 75 mg by mouth twice daily.     She is not a candidate for anti-TNF's due to her history of congestive heart failure.  She had an inadequate response to Plaquenil, Enbrel, Remicade, and Orencia in the past.  She will discontinue Actemra due to developing a rash.  Jolee Ewing was discontinued in the past due to cost.  She discontinued methotrexate due to elevated creatinine and LFTs in the past.  Activities of Daily Living:  Patient reports morning stiffness for several hours.   Patient Reports nocturnal pain.  Difficulty dressing/grooming: Reports Difficulty climbing stairs: Reports Difficulty getting out of chair: Reports Difficulty using hands for taps, buttons, cutlery, and/or writing: Reports  Review of Systems  Constitutional: Positive  for fatigue.  HENT: Positive for mouth dryness. Negative for mouth sores and nose dryness.   Eyes: Negative for pain, itching, visual disturbance and dryness.  Respiratory: Negative for cough, hemoptysis, shortness of breath, wheezing and difficulty breathing.   Cardiovascular: Negative for chest pain, palpitations, hypertension and swelling in legs/feet.  Gastrointestinal: Negative for abdominal pain, blood in stool, constipation and diarrhea.  Endocrine: Negative for increased urination.  Genitourinary: Negative for difficulty urinating and painful urination.  Musculoskeletal: Positive for arthralgias, joint pain, joint swelling and morning stiffness. Negative for myalgias, muscle weakness, muscle tenderness and myalgias.  Skin: Positive for rash. Negative for color change, pallor, hair loss, nodules/bumps, skin tightness, ulcers and sensitivity to sunlight.  Allergic/Immunologic: Negative for susceptible to infections.  Neurological: Positive for headaches. Negative for dizziness, numbness and memory loss.  Hematological: Negative for swollen glands.  Psychiatric/Behavioral: Positive for sleep disturbance. Negative for depressed mood and confusion. The patient is not nervous/anxious.     PMFS History:  Patient Active Problem List   Diagnosis Date Noted  . Near syncope 02/12/2018  . Orthostatic hypotension 02/12/2018  . Symptomatic bradycardia 02/10/2018  . Primary osteoarthritis of right knee 09/25/2016  . 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 Elevated d-dimer   . Encephalopathy   . Sinus bradycardia   . Acute encephalopathy 02/27/2016  . Seropositive rheumatoid arthritis of multiple sites ([place]) 01/17/2016  . Bilateral hand pain 01/17/2016  .  Bilateral wrist pain 01/17/2016  . Pain in right elbow 01/17/2016  . Left shoulder pain 01/17/2016  . Encounter for therapeutic drug monitoring 11/02/2015  . Bronchiectasis without complication ([place]) 97/35/3299  .  Chronic gout involving toe without tophus 10/30/2015  . High risk medication use 10/29/2015  . Gout 10/29/2015  . H/O total knee replacement, left 10/29/2015  . 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 Diabetes mellitus, type II, insulin dependent ([place]) 10/18/2015  . AKI (acute kidney injury) ([place]) 10/18/2015  . CKD (chronic kidney disease) stage 3, GFR 30-59 ml/min (HCC) 10/18/2015  . Nodule of left lung 10/18/2015  . Neuropathy 08/22/2014  . Hypothyroidism 08/22/2014  . Chronic pain 08/22/2014  . Chronic congestive heart failure with left ventricular diastolic dysfunction ([place]) 08/08/2013  . Chest pain 08/05/2013  . Aortic stenosis 12/22/2012  . Hyperlipidemia 12/22/2012  . PVC (premature ventricular contraction) 12/22/2012  . HTN (hypertension) 12/22/2012  . 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 Open angle with borderline findings, high risk 12/15/2011  . Cataract, nuclear 05/25/2011    Past Medical History:  Diagnosis Date  . Allergic rhinitis   . Anemia   . Aortic stenosis 12/22/2012   Mild 3/14-2.35 m/s peak velocity  . Cataract 2018  . CHF (congestive heart failure) ([place])   . Chronic pain   . CKD (chronic kidney disease), stage III ([place])   . Diabetes mellitus   . Diverticulosis   . DVT (deep venous thrombosis) ([place])   . Glaucoma   . Hemorrhoids    internal  . HTN (hypertension)   . Hypercholesteremia   . Hyperlipidemia 12/22/2012  . Hypothyroidism   . Meningioma ([place]) 2008  . Osteoarthritis   . Peripheral neuropathy   . PPD positive    6 months ago  . PVC (premature ventricular contraction) 12/22/2012  . Rheumatoid arthritis(714.0)   . Sickle cell trait ([place])   . 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 Hypotension Mother   . CVA Mother   . Diabetes Mother   . Hypertension Father   . Kidney failure Father     Past Surgical History:  Procedure Laterality Date  . ABDOMINAL HYSTERECTOMY  1978   TAH,& BSO 1  . CATARACT EXTRACTION Bilateral   . EYE SURGERY    . fistula repair  03/2010   partial colectomy  . intracranial surgery  11/2007   removal of meningioma  . JOINT REPLACEMENT  2001   left TKR  . LEFT HEART CATHETERIZATION WITH CORONARY ANGIOGRAM N/A 08/08/2013   Procedure: LEFT HEART CATHETERIZATION WITH CORONARY ANGIOGRAM;  Surgeon: Peter M Martinique, MD;  Location: [facility] CATH LAB;  Service: Cardiovascular;  Laterality: N/A;  . ROTATOR CUFF REPAIR  1999   BIlateral  . SIGMOIDECTOMY  2012  . TOE SURGERY Left 2018  . TONSILLECTOMY AND ADENOIDECTOMY    . TOTAL THYROIDECTOMY  1992  . wrist sx     right    Social History   Social History Narrative   Daycare Provider   Drinks about 1 cup of coffee a day    Immunization History  Administered Date(s) Administered  . Influenza-Unspecified 09/11/2015     Objective: Vital Signs: BP (!) 143/62 (BP Location: Right Wrist, Patient Position: Sitting, Cuff Size: Normal)   Pulse 60   Resp 15   Ht 5\' 2"  (1.575  m)   Wt 180 lb 12.8 oz (82 kg)   LMP  (LMP Unknown)   BMI 33.07 kg/m    Physical Exam Vitals signs and nursing note reviewed.  Constitutional:      Appearance: She is well-developed.  HENT:     Head: Normocephalic and atraumatic.  Eyes:     Conjunctiva/sclera: Conjunctivae normal.  Neck:     Musculoskeletal: Normal range of motion.  Cardiovascular:     Rate and Rhythm: Normal rate and regular rhythm.     Heart sounds: Normal heart sounds.  Pulmonary:     Effort: Pulmonary effort is normal.     Breath sounds: Normal breath sounds.  Abdominal:     General: Bowel sounds are normal.     Palpations: Abdomen is soft.  Lymphadenopathy:     Cervical: No cervical adenopathy.  Skin:    General: Skin is warm and dry.     Capillary Refill: Capillary refill takes less than 2 seconds.  Neurological:     Mental Status: She is alert and  oriented to person, place, and time.  Psychiatric:        Behavior: Behavior normal.      Musculoskeletal Exam: C-spine limited range of motion.  Thoracic kyphosis noted.  Limited range of motion of the lumbar spine with discomfort.  Shoulder abduction to about 90 degrees with right shoulder discomfort.  Elbow joints have good range of motion.  She has tenderness and inflammation of the right elbow joint.  Limited range of motion bilateral wrist joints.  She has tenderness and synovitis of bilateral wrists.  She has synovitis of MCP and PIP joints as described below.  She has incomplete fist formation bilaterally.  Right knee has warmth, tenderness, and swelling.  She has limited flexion extension of the right knee discomfort.  Left knee replacement has good range of motion with no warmth or effusion.  She has tenderness and swelling of the right ankle joint.  CDAI Exam: CDAI Score: 31.6  Patient Global: 8 mm; Provider Global: 8 mm Swollen: 15 ; Tender: 17  Joint Exam      Right  Left  Glenohumeral   Tender     Elbow   Tender     Wrist  Swollen Tender  Swollen Tender  MCP 1  Swollen Tender     MCP 2  Swollen Tender  Swollen Tender  MCP 3  Swollen Tender  Swollen Tender  MCP 4  Swollen Tender  Swollen Tender  MCP 5  Swollen Tender  Swollen Tender  PIP 3  Swollen Tender     PIP 4  Swollen Tender     Knee  Swollen Tender     Ankle  Swollen Tender        Investigation: No additional findings.  Imaging: No results found.  Recent Labs: Lab Results  Component Value Date   WBC 8.2 08/25/2018   HGB 10.3 (L) 08/25/2018   PLT 279 08/25/2018   NA 143 08/25/2018   K 4.2 08/25/2018   CL 109 08/25/2018   CO2 24 08/25/2018   GLUCOSE 187 (H) 08/25/2018   BUN 16 08/25/2018   CREATININE 1.46 (H) 08/25/2018   BILITOT 1.0 08/25/2018   ALKPHOS 73 08/25/2018   AST 22 08/25/2018   ALT 13 08/25/2018   PROT 7.1 08/25/2018   ALBUMIN 3.8 08/25/2018   CALCIUM 9.6 08/25/2018   GFRAA 39 (L)  08/25/2018   QFTBGOLD Positive (A) 04/28/2014  February 10, 2018 chest x-ray  normal  Speciality Comments: Orencia 750mg  IV  every 4 weeks TB gold has been positive in the past, patient has yearly chest xrays  Procedures:  No procedures performed Allergies: Methotrexate derivatives, Other, Penicillins, Remicade [infliximab], and Zocor [simvastatin]   Assessment / Plan:     Visit Diagnoses: Rheumatoid arthritis of multiple sites with negative rheumatoid factor ([place]): She is currently having a rheumatoid arthritis flare.  She has synovitis and tenderness in multiple joints as described above.  She has been experiencing severe pain in bilateral hands, bilateral wrists, and the right knee joint.  She has been walking with a cane.  At her last visit on 07/14/2018 she agreed to switch from Orencia IV infusions to Actemra IV infusions.  She has had 2 Actemra infusions and after the most recent infusion she developed a rash about 4 to 5 days afterwards.  She would like to discontinue Actemra at this time.  She is not a candidate for anti-TNF's due to her history of congestive heart failure.  She has had an inadequate response to Plaquenil, Enbrel, Remicade, and Orencia.  She could not afford Arava in the past.  She discontinued methotrexate due to elevated creatinine and elevated LFTs in the past.  We will apply for Rinvoq.  Side effects indications contraindications were discussed at length.  If approved will start the medication.   High risk medication use - She is not a candidate for anti-TNF's due to her history of congestive heart failure.  She had an inadequate response to Plaquenil, Enbrel, Remicade, and Orencia in the past.  She will discontinue Actemra due to developing a rash.  Jolee Ewing was discontinued in the past due to cost.  She discontinued methotrexate due to elevated creatinine and LFTs in the past.  Idiopathic chronic gout of multiple sites without tophus: She has had any recent gout flares.  She  is clinically doing well on allopurinol 300 mg 1 tablet by mouth daily.  Primary osteoarthritis of right knee: She has right knee warmth and swelling on exam today.  She has limited extension and flexion with discomfort.  She walks with a cane.  H/O total knee replacement, left: Doing well.  Good range of motion.  She walks with a cane.  Positive QuantiFERON-TB Gold test/ patient has yearly chest xrays   DDD (degenerative disc disease), lumbar: She has chronic lower back pain.  Other medical conditions are listed as follows:  Chronic congestive heart failure with left ventricular diastolic dysfunction (HCC)  History of diabetes mellitus  CKD (chronic kidney disease) stage 3, GFR 30-59 ml/min (HCC)  History of hyperlipidemia  History of hypertension  Nodule of left lung  Orders: No orders of the defined types were placed in this encounter.  No orders of the defined types were placed in this encounter.   Face-to-face time spent with patient was 30 minutes. Greater than 50% of time was spent in counseling and coordination of care.  Follow-Up Instructions: Return in about 3 months (around 12/03/2018) for Rheumatoid arthritis, Osteoarthritis, Gout.   Ofilia Neas, PA-C   I examined and evaluated the patient with Hazel Sams PA.  Patient had 2 doses of Actemra infusion so far.  She has developed a rash all over on Actemra.  She also did not respond well to Actemra so far.  She is failed anti-TNF cigarettes in the past.  We had detailed discussion regarding different treatment options and the side effects.  We will apply for Rinvoq, and if approved will start  on the medication.  Detailed counseling was provided and side effects are reviewed.  The plan of care was discussed as noted above.  Bo Merino, MD  Note - This record has been created using Editor, commissioning.  Chart creation errors have been sought, but may not always  have been located. Such creation errors do not  reflect on  the standard of medical care.

## 2018-09-02 NOTE — Patient Instructions (Signed)
Please bring back income documents to apply for Rinvoq patient assistance.

## 2018-09-03 ENCOUNTER — Telehealth: Payer: Self-pay | Admitting: Rheumatology

## 2018-09-03 NOTE — Telephone Encounter (Signed)
Ran test claim for 1 month supply. Patient's copay is $1451.51. Patient will need to move forward with patient assistance.  10:25 AM Beatriz Chancellor, CPhT

## 2018-09-03 NOTE — Telephone Encounter (Signed)
Patient would like to hold off on medication that was discussed with her at this time. Patient would like to stay with Orencia infusions that insurance will cover. Patient requests a call back to discuss.

## 2018-09-03 NOTE — Telephone Encounter (Signed)
Patient has decided not to move forward with Rinvoq.  Nothing further needed at this time.  Will close encounter.

## 2018-09-06 ENCOUNTER — Telehealth: Payer: Self-pay | Admitting: Rheumatology

## 2018-09-06 NOTE — Telephone Encounter (Signed)
Patient left a voicemail stating she was returning your call.   

## 2018-09-06 NOTE — Telephone Encounter (Signed)
Returned patient call.  Discussed proceeding with Rinvoq.  After discussing with her husband he did not want to her to proceed with the medication as they do not have their income documents readily available as they had to move temporarily with family. He also felt he shouldn't have to provide documents as "he is already paying for it". Believe there was confusion between insurance and patient assistance.  Informed patient that Rinvoq was approved through insurance but her co-pay was almost $1500 a month.  Informed that patient assistance is not associated with insurance but is a Midwife from the manufacturer of the medication.  They have to provide income documents to show they are unable to afford the medication in order to be approved.  If approved she would receive the medication free of charge from the manufacturer. Patient verbalized understanding.  Advised patient to discuss with husband again with the clarifications provided.  Gave patient my desk number for husband to call with any questions.  She stated that her hand swelling was worse.  Explained that is the reason why we want to switch medications from Actemra as it is no longer adequately controlling her symptoms.  Patient verbalized understanding.  All questions encouraged and answered.   Mariella Saa, PharmD, [place], [place] Clinical Specialty Pharmacist [phone]  09/06/2018 3:39 PM

## 2018-09-07 NOTE — Telephone Encounter (Signed)
Called [employer], rep [name] advised that they can accept patient's monthly bank statements as long as her name appears on the statement. Statement should dated and reflect incoming deposits received.   Phone# [phone]  9:38 AM [name], CPhT

## 2018-09-07 NOTE — Telephone Encounter (Signed)
Called patient to advise. No answer or voicemail. Will follow up

## 2018-09-07 NOTE — Telephone Encounter (Signed)
Called alternate phone number and spoke to patient and her husband. Husband had questions about medication cost. Advised that her copay through insurance is high, but if she is approved through PAP program, it would be free of charge. Husband said he will gather income documents and will bring them to office for application. Advised to them that they could bring bank documents for income verification.  Marland Kitchen

## 2018-09-09 DIAGNOSIS — I011 Acute rheumatic endocarditis: Secondary | ICD-10-CM | POA: Diagnosis not present

## 2018-09-16 NOTE — Telephone Encounter (Signed)
Called patient's home number. Spoke with daughter and left message that I was just calling to follow up on paperwork she was bringing by the office. Will follow up.  10:23 AM Beatriz Chancellor, CPhT

## 2018-09-17 ENCOUNTER — Other Ambulatory Visit: Payer: Self-pay | Admitting: *Deleted

## 2018-09-17 NOTE — Progress Notes (Signed)
Infusion orders are current for patient CBC CMP Tylenol Benadryl appointments are up to date and follow up appointment  is scheduled TB gold not due yet.  

## 2018-09-20 DIAGNOSIS — M1711 Unilateral primary osteoarthritis, right knee: Secondary | ICD-10-CM | POA: Diagnosis not present

## 2018-09-20 NOTE — Telephone Encounter (Signed)
Patient came to office. Her husband does not want to apply for Rinvoq.  He does not want to give any income information and feels he shouldn't have to since he pays for insurance.   Patient wants to resume Orencia.  Please advise. Could benefit from phone call with patient and husband.

## 2018-09-21 ENCOUNTER — Other Ambulatory Visit: Payer: Self-pay | Admitting: *Deleted

## 2018-09-21 ENCOUNTER — Encounter: Payer: Self-pay | Admitting: Physician Assistant

## 2018-09-21 DIAGNOSIS — M0609 Rheumatoid arthritis without rheumatoid factor, multiple sites: Secondary | ICD-10-CM

## 2018-09-21 MED ORDER — SODIUM CHLORIDE 0.9 % IV SOLN: 750.0000 mg | INTRAVENOUS | Status: AC

## 2018-09-21 NOTE — Telephone Encounter (Signed)
New infusion orders placed.

## 2018-09-21 NOTE — Telephone Encounter (Signed)
Findings of benefits investigation for Orencia IV:  Insurance: Medicare is primary and Publishing copy program is secondary  Phone: [phone]  Plan is [place] [id number]  Medicare covers 80% of the infusion and no authorization is required, and the patient's secondary [employer] employee plan would cover the 20% of the cost that was not paid for by Medicare; as long as Medicare covered the medication and it is deemed medically necessary (appropriate diagnosis codes). Secondary will also covers the patient's Medicare deductible.  RepRachel Bo Ref# [id number]  8:52 AM Beatriz Chancellor, CPhT

## 2018-09-21 NOTE — Telephone Encounter (Signed)
OK to resume Orencia. You may send a letter to the patient about the process of patient assistance foe Rinvoq.

## 2018-09-21 NOTE — Telephone Encounter (Signed)
Patient notified.  She has appointment scheduled for tomorrow.

## 2018-09-21 NOTE — Addendum Note (Signed)
Addended by: Mariella Saa C on: 09/21/2018 12:09 PM   Modules accepted: Orders

## 2018-09-21 NOTE — Telephone Encounter (Signed)
Patient will be resuming Orencia. Rachael can you see if pre-cert for [name] is still active?   Seth Bake can you please put orders in for Orencia?  Thanks!  Mariella Saa, PharmD, [place], Deputy Clinical Specialty Pharmacist [phone]  09/21/2018 8:32 AM

## 2018-09-22 ENCOUNTER — Ambulatory Visit (HOSPITAL_COMMUNITY)
Admission: RE | Admit: 2018-09-22 | Discharge: 2018-09-22 | Disposition: A | Discharge status: Home / Self Care | Payer: Medicare Other | Source: Ambulatory Visit | Attending: Rheumatology | Admitting: Rheumatology

## 2018-09-22 ENCOUNTER — Other Ambulatory Visit: Payer: Self-pay

## 2018-09-22 DIAGNOSIS — M0609 Rheumatoid arthritis without rheumatoid factor, multiple sites: Secondary | ICD-10-CM | POA: Diagnosis not present

## 2018-09-22 LAB — COMPREHENSIVE METABOLIC PANEL
ALT: 11 U/L (ref 0–44)
AST: 17 U/L (ref 15–41)
Albumin: 3.6 g/dL (ref 3.5–5.0)
Alkaline Phosphatase: 79 U/L (ref 38–126)
Anion gap: 8 (ref 5–15)
BUN: 16 mg/dL (ref 8–23)
CO2: 24 mmol/L (ref 22–32)
Calcium: 9.5 mg/dL (ref 8.9–10.3)
Chloride: 108 mmol/L (ref 98–111)
Creatinine, Ser: 1.18 mg/dL — ABNORMAL HIGH (ref 0.44–1.00)
GFR calc Af Amer: 51 mL/min — ABNORMAL LOW (ref >60)
GFR calc non Af Amer: 44 mL/min — ABNORMAL LOW (ref >60)
Glucose, Bld: 146 mg/dL — ABNORMAL HIGH (ref 70–99)
Potassium: 4.1 mmol/L (ref 3.5–5.1)
Sodium: 140 mmol/L (ref 135–145)
Total Bilirubin: 0.9 mg/dL (ref 0.3–1.2)
Total Protein: 6.6 g/dL (ref 6.5–8.1)

## 2018-09-22 LAB — CBC
HCT: 30.7 % — ABNORMAL LOW (ref 36.0–46.0)
Hemoglobin: 10.2 g/dL — ABNORMAL LOW (ref 12.0–15.0)
MCH: 30.2 pg (ref 26.0–34.0)
MCHC: 33.2 g/dL (ref 30.0–36.0)
MCV: 90.8 fL (ref 80.0–100.0)
Platelets: 202 10*3/uL (ref 150–400)
RBC: 3.38 MIL/uL — ABNORMAL LOW (ref 3.87–5.11)
RDW: 16.6 % — ABNORMAL HIGH (ref 11.5–15.5)
WBC: 8.2 10*3/uL (ref 4.0–10.5)
nRBC: 0 % (ref 0.0–0.2)

## 2018-09-22 MED ORDER — SODIUM CHLORIDE 0.9 % IV SOLN
750.0000 mg | INTRAVENOUS | Status: DC
  Administered 2018-09-22: 750 mg via INTRAVENOUS
  Filled 2018-09-22: qty 30

## 2018-09-22 MED ORDER — ACETAMINOPHEN 325 MG PO TABS: 650.0000 mg | ORAL_TABLET | ORAL | Status: DC

## 2018-09-22 MED ORDER — DIPHENHYDRAMINE HCL 25 MG PO CAPS: 25.0000 mg | ORAL_CAPSULE | ORAL | Status: DC

## 2018-09-22 NOTE — Progress Notes (Signed)
Labs are stable.

## 2018-10-05 ENCOUNTER — Other Ambulatory Visit: Payer: Self-pay

## 2018-10-05 ENCOUNTER — Encounter: Payer: Self-pay | Admitting: Sports Medicine

## 2018-10-05 ENCOUNTER — Ambulatory Visit (INDEPENDENT_AMBULATORY_CARE_PROVIDER_SITE_OTHER): Payer: Medicare Other | Admitting: Sports Medicine

## 2018-10-05 DIAGNOSIS — M79675 Pain in left toe(s): Secondary | ICD-10-CM | POA: Diagnosis not present

## 2018-10-05 DIAGNOSIS — M79674 Pain in right toe(s): Secondary | ICD-10-CM

## 2018-10-05 DIAGNOSIS — E1140 Type 2 diabetes mellitus with diabetic neuropathy, unspecified: Secondary | ICD-10-CM

## 2018-10-05 DIAGNOSIS — Z961 Presence of intraocular lens: Secondary | ICD-10-CM | POA: Diagnosis not present

## 2018-10-05 DIAGNOSIS — E119 Type 2 diabetes mellitus without complications: Secondary | ICD-10-CM | POA: Diagnosis not present

## 2018-10-05 DIAGNOSIS — Z794 Long term (current) use of insulin: Secondary | ICD-10-CM

## 2018-10-05 DIAGNOSIS — H3581 Retinal edema: Secondary | ICD-10-CM | POA: Diagnosis not present

## 2018-10-05 DIAGNOSIS — B351 Tinea unguium: Secondary | ICD-10-CM

## 2018-10-05 DIAGNOSIS — L84 Corns and callosities: Secondary | ICD-10-CM

## 2018-10-05 NOTE — Progress Notes (Signed)
Patient ID: [name], female   DOB: [birth year], 78 y.o.   MRN: [id number]  Subjective: [name] is a 78 y.o. female patient with history of type 2 diabetes who returns to office today complaining of long, painful nails and callus while ambulating in shoes; unable to trim. Patient states that the glucose reading this morning was not recorded. Patient last A1c was 6.4. Patient denies any other symptoms.   Patient Active Problem List   Diagnosis Date Noted  . Near syncope 02/12/2018  . Orthostatic hypotension 02/12/2018  . Symptomatic bradycardia 02/10/2018  . Primary osteoarthritis of right knee 09/25/2016  . 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 Elevated d-dimer   . Encephalopathy   . Sinus bradycardia   . Acute encephalopathy 02/27/2016  . Seropositive rheumatoid arthritis of multiple sites ([place]) 01/17/2016  . Bilateral hand pain 01/17/2016  . Bilateral wrist pain 01/17/2016  . Pain in right elbow 01/17/2016  . Left shoulder pain 01/17/2016  . Encounter for therapeutic drug monitoring 11/02/2015  . Bronchiectasis without complication ([place]) 09/32/3557  . Chronic gout involving toe without tophus 10/30/2015  . High risk medication use 10/29/2015  . Gout 10/29/2015  . H/O total knee replacement, left 10/29/2015  . 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 Diabetes mellitus, type II, insulin dependent ([place]) 10/18/2015  . AKI (acute kidney injury) ([place]) 10/18/2015  . CKD (chronic kidney disease) stage 3, GFR 30-59 ml/min ([facility]) 10/18/2015  . Nodule of left lung 10/18/2015  . Neuropathy 08/22/2014  . Hypothyroidism 08/22/2014  . Chronic pain 08/22/2014  . Chronic congestive heart failure with left ventricular diastolic dysfunction ([place]) 08/08/2013  . Chest pain 08/05/2013  . Aortic stenosis 12/22/2012  . Hyperlipidemia 12/22/2012  . PVC (premature ventricular contraction) 12/22/2012  . HTN  (hypertension) 12/22/2012  . 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 Open angle with borderline findings, high risk 12/15/2011  . Cataract, nuclear 05/25/2011   Current Outpatient Medications on File Prior to Visit  Medication Sig Dispense Refill  . abatacept 750 mg in sodium chloride 0.9 % 70 mL Inject 750 mg into the vein every 28 (twenty-eight) days.    Marland Kitchen acetaminophen (TYLENOL) 500 MG tablet Take 1,000 mg by mouth every 6 (six) hours as needed for mild pain.    Marland Kitchen aspirin EC 81 MG EC tablet Take 1 tablet (81 mg total) by mouth daily. 30 tablet 0  . atorvastatin (LIPITOR) 80 MG tablet Take 80 mg by mouth daily at 6 PM.     . BD INSULIN SYRINGE ULTRAFINE 31G X 15/64" 0.3 ML MISC     . cholecalciferol (VITAMIN D) 1000 UNITS tablet Take 1,000 Units by mouth daily.     . clotrimazole-betamethasone (LOTRISONE) cream Apply 1 application topically as needed.     . cyclobenzaprine (FLEXERIL) 10 MG tablet Take 10 mg by mouth daily as needed for muscle spasms.     . ferrous sulfate (FER-IN-SOL) 75 (15 Fe) MG/ML SOLN Take 45 mg of iron by mouth daily.    . folic acid (FOLVITE) 1 MG tablet Take 1 mg by mouth daily.    Marland Kitchen HYDROcodone-acetaminophen (NORCO) 5-325 MG tablet Take 1 tablet by mouth every 6 (six) hours as needed for moderate pain. 21 tablet 0  . levothyroxine (SYNTHROID, LEVOTHROID) 200 MCG tablet Take 1 tablet (200 mcg total) by mouth daily at 6 (six) AM. 30 tablet 0  . losartan (COZAAR) 50 MG tablet Take 50  mg by mouth daily.    Marland Kitchen NOVOLIN 70/30 RELION (70-30) 100 UNIT/ML injection INJECT 6 TO 8 UNITS SUBCUTANEOUSLY THREE TIMES DAILY WITH MEALS    . NYSTATIN powder APPLY AS DIRECTED TOPICALLY TWICE DAILY AS NEEDED 7 DAYS    . pregabalin (LYRICA) 75 MG capsule 1 bid 28 capsule 0  . pyridOXINE (VITAMIN B-6) 100 MG tablet Take 100 mg by mouth daily.    Marland Kitchen VITAMIN E PO Take 1 capsule by mouth daily.     Marland Kitchen allopurinol (ZYLOPRIM) 300 MG tablet Take 1 tablet (300 mg total) by mouth daily. 30 tablet 5    No current facility-administered medications on file prior to visit.    Allergies  Allergen Reactions  . Methotrexate Derivatives Other (See Comments)    Elevated creat. Levels  . Other Other (See Comments)    NO Blood products.  [name]- causes low blood sugar (pt states she was taking medication but cant remember what it was for.)  . Penicillins Rash    Has patient had a PCN reaction causing immediate rash, facial/tongue/throat swelling, SOB or lightheadedness with hypotension: Yes Has patient had a PCN reaction causing severe rash involving mucus membranes or skin necrosis: Yes  Has patient had a PCN reaction that required hospitalization No Has patient had a PCN reaction occurring within the last 10 years: No If all of the above answers are "NO", then may proceed with Cephalosporin use.   . Remicade [Infliximab] Other (See Comments)    Inadequate response   . Zocor [Simvastatin] Other (See Comments)    Leg cramps     Objective: General: Patient is awake, alert, and oriented x 3 and in no acute distress.  Integument: Skin is warm, dry and supple bilateral. Nails are tender, long, thickened and dystrophic with subungual debris, consistent with onychomycosis, 1-5 bilateral. No open lesions. + callus/ preulcerative lesions present at left 4th and  right 4th toe distal tuft with dry blood, with no signs of infection. Remaining integument unremarkable.  Vasculature:  Dorsalis Pedis pulse 1/4 bilateral. Posterior Tibial pulse  1/4 bilateral. Capillary fill time <3 sec 1-5 bilateral. Scant hair growth to the level of the digits.Temperature gradient within normal limits. No varicosities present bilateral. Trace edema present bilateral.   Neurology: The patient has diminished sensation measured with a 5.07/10g Semmes Weinstein Monofilament at all pedal sites bilateral . Vibratory sensation diminished bilateral with tuning fork. No Babinski sign present bilateral.    Musculoskeletal: +Bunion and hammertoe pedal deformities noted bilateral.  No reproducible right ankle pain noted at today's visit.  Pes planus foot type. Muscular strength 5/5 in all lower extremity muscular groups bilateral. No tenderness with calf compression bilateral.   Assessment and Plan: Problem List Items Addressed This Visit    None    Visit Diagnoses    Pain due to onychomycosis of toenails of both feet    -  Primary   Pre-ulcerative calluses       Type 2 diabetes mellitus with diabetic neuropathy, with long-term current use of insulin ([place])         -Examined patient. -Re-discussed and educated patient on diabetic foot care, especially with  regards to the vascular, neurological and musculoskeletal systems.  -Mechanically debrided all nails 1-5 bilateral using sterile nail nipper and filed with dremel without incident  -At no additional charge, Debrided callus at right and left 4th toes using sterile chisel blade without incident. -Recommend to toe caps as dispensed this visit -Patient to return as  in 10-12 weeks for nail and callus care  -Patient advised to call the office if any problems or questions arise in the meantime.  Landis Martins, DPM

## 2018-10-08 ENCOUNTER — Other Ambulatory Visit: Payer: Self-pay | Admitting: *Deleted

## 2018-10-08 NOTE — Progress Notes (Signed)
Infusion orders are current for patient CBC CMP Tylenol Benadryl appointments are up to date and follow up appointment  is scheduled and yearly chest x-ray not due.

## 2018-10-13 ENCOUNTER — Ambulatory Visit: Payer: Medicare Other | Admitting: Rheumatology

## 2018-10-19 DIAGNOSIS — M109 Gout, unspecified: Secondary | ICD-10-CM | POA: Diagnosis not present

## 2018-10-19 DIAGNOSIS — Z23 Encounter for immunization: Secondary | ICD-10-CM | POA: Diagnosis not present

## 2018-10-19 DIAGNOSIS — R32 Unspecified urinary incontinence: Secondary | ICD-10-CM | POA: Diagnosis not present

## 2018-10-19 DIAGNOSIS — N3281 Overactive bladder: Secondary | ICD-10-CM | POA: Diagnosis not present

## 2018-10-19 DIAGNOSIS — M069 Rheumatoid arthritis, unspecified: Secondary | ICD-10-CM | POA: Diagnosis not present

## 2018-10-19 DIAGNOSIS — M1990 Unspecified osteoarthritis, unspecified site: Secondary | ICD-10-CM | POA: Diagnosis not present

## 2018-10-19 DIAGNOSIS — M797 Fibromyalgia: Secondary | ICD-10-CM | POA: Diagnosis not present

## 2018-10-20 ENCOUNTER — Ambulatory Visit (HOSPITAL_COMMUNITY)
Admission: RE | Admit: 2018-10-20 | Discharge: 2018-10-20 | Disposition: A | Discharge status: Home / Self Care | Payer: Medicare Other | Source: Ambulatory Visit | Attending: Rheumatology | Admitting: Rheumatology

## 2018-10-20 ENCOUNTER — Other Ambulatory Visit: Payer: Self-pay

## 2018-10-20 DIAGNOSIS — M0609 Rheumatoid arthritis without rheumatoid factor, multiple sites: Secondary | ICD-10-CM | POA: Diagnosis not present

## 2018-10-20 MED ORDER — DIPHENHYDRAMINE HCL 25 MG PO CAPS
25.0000 mg | ORAL_CAPSULE | ORAL | Status: DC
  Administered 2018-10-20: 25 mg via ORAL

## 2018-10-20 MED ORDER — ACETAMINOPHEN 325 MG PO TABS
ORAL_TABLET | ORAL | Status: AC
  Filled 2018-10-20: qty 2

## 2018-10-20 MED ORDER — DIPHENHYDRAMINE HCL 25 MG PO CAPS
ORAL_CAPSULE | ORAL | Status: AC
  Filled 2018-10-20: qty 1

## 2018-10-20 MED ORDER — SODIUM CHLORIDE 0.9 % IV SOLN
750.0000 mg | INTRAVENOUS | Status: DC
  Administered 2018-10-20: 750 mg via INTRAVENOUS
  Filled 2018-10-20: qty 30

## 2018-10-20 MED ORDER — ACETAMINOPHEN 325 MG PO TABS
650.0000 mg | ORAL_TABLET | ORAL | Status: DC
  Administered 2018-10-20: 650 mg via ORAL

## 2018-11-11 ENCOUNTER — Other Ambulatory Visit: Payer: Self-pay | Admitting: *Deleted

## 2018-11-11 DIAGNOSIS — G894 Chronic pain syndrome: Secondary | ICD-10-CM | POA: Diagnosis not present

## 2018-11-11 DIAGNOSIS — M069 Rheumatoid arthritis, unspecified: Secondary | ICD-10-CM | POA: Diagnosis not present

## 2018-11-11 NOTE — Progress Notes (Signed)
Infusion orders are current for patient CBC CMP Tylenol Benadryl appointments are up to date and follow up appointment  is scheduled chest x-ray up to date.

## 2018-11-17 ENCOUNTER — Ambulatory Visit (HOSPITAL_COMMUNITY)
Admission: RE | Admit: 2018-11-17 | Discharge: 2018-11-17 | Disposition: A | Discharge status: Home / Self Care | Payer: Medicare Other | Source: Ambulatory Visit | Attending: Rheumatology | Admitting: Rheumatology

## 2018-11-17 ENCOUNTER — Other Ambulatory Visit: Payer: Self-pay

## 2018-11-17 DIAGNOSIS — M0609 Rheumatoid arthritis without rheumatoid factor, multiple sites: Secondary | ICD-10-CM | POA: Diagnosis not present

## 2018-11-17 LAB — COMPREHENSIVE METABOLIC PANEL
ALT: 10 U/L (ref 0–44)
AST: 21 U/L (ref 15–41)
Albumin: 3.4 g/dL — ABNORMAL LOW (ref 3.5–5.0)
Alkaline Phosphatase: 81 U/L (ref 38–126)
Anion gap: 10 (ref 5–15)
BUN: 23 mg/dL (ref 8–23)
CO2: 23 mmol/L (ref 22–32)
Calcium: 9.1 mg/dL (ref 8.9–10.3)
Chloride: 106 mmol/L (ref 98–111)
Creatinine, Ser: 1.68 mg/dL — ABNORMAL HIGH (ref 0.44–1.00)
GFR calc Af Amer: 33 mL/min — ABNORMAL LOW (ref >60)
GFR calc non Af Amer: 29 mL/min — ABNORMAL LOW (ref >60)
Glucose, Bld: 179 mg/dL — ABNORMAL HIGH (ref 70–99)
Potassium: 4.6 mmol/L (ref 3.5–5.1)
Sodium: 139 mmol/L (ref 135–145)
Total Bilirubin: 0.7 mg/dL (ref 0.3–1.2)
Total Protein: 6.8 g/dL (ref 6.5–8.1)

## 2018-11-17 LAB — CBC
HCT: 31.6 % — ABNORMAL LOW (ref 36.0–46.0)
Hemoglobin: 10.1 g/dL — ABNORMAL LOW (ref 12.0–15.0)
MCH: 29.5 pg (ref 26.0–34.0)
MCHC: 32 g/dL (ref 30.0–36.0)
MCV: 92.4 fL (ref 80.0–100.0)
Platelets: 273 10*3/uL (ref 150–400)
RBC: 3.42 MIL/uL — ABNORMAL LOW (ref 3.87–5.11)
RDW: 15 % (ref 11.5–15.5)
WBC: 8.4 10*3/uL (ref 4.0–10.5)
nRBC: 0 % (ref 0.0–0.2)

## 2018-11-17 MED ORDER — SODIUM CHLORIDE 0.9 % IV SOLN
750.0000 mg | INTRAVENOUS | Status: DC
  Administered 2018-11-17: 750 mg via INTRAVENOUS
  Filled 2018-11-17: qty 30

## 2018-11-17 MED ORDER — DIPHENHYDRAMINE HCL 25 MG PO CAPS: 25.0000 mg | ORAL_CAPSULE | ORAL | Status: DC

## 2018-11-17 MED ORDER — ACETAMINOPHEN 325 MG PO TABS: 650.0000 mg | ORAL_TABLET | ORAL | Status: DC

## 2018-11-17 NOTE — Progress Notes (Signed)
Virtual Visit via Telephone Note  I connected with [name] on 12/01/18 at  2:30 PM EST by telephone and verified that I am speaking with the correct person using two identifiers.  Location: Patient: Home  Provider: Clinic  This service was conducted via virtual visit.  Both audio and visual tools were used.  The patient was located at home. I was located in my office.  Consent was obtained prior to the virtual visit and is aware of possible charges through their insurance for this visit.  The patient is an established patient.  Dr. Estanislado Pandy, MD conducted the virtual visit and Hazel Sams, PA-C acted as scribed during the service.  Office staff helped with scheduling follow up visits after the service was conducted.   I discussed the limitations, risks, security and privacy concerns of performing an evaluation and management service by telephone and the availability of in person appointments. I also discussed with the patient that there may be a patient responsible charge related to this service. The patient expressed understanding and agreed to proceed.  CC: Pain in multiple joints  History of Present Illness: Patient is a 78 year old female with a past medical history of seronegative rheumatoid arthritis, gout, and osteoarthritis.  She is on orencia 750 mg IV infusions every 28 days.  She has ongoing recurrent flares. She continues to have pain in both shoulder joints, both hands, both wrist joints, both knee joints, and right ankle joint. She has ongoing swelling in both knee joints and the right ankle joint.  She uses a cane to assist with ambulation.   Review of Systems  Constitutional: Negative for fever and malaise/fatigue.  Eyes: Negative for photophobia, pain, discharge and redness.  Respiratory: Negative for cough, shortness of breath and wheezing.   Cardiovascular: Negative for chest pain and palpitations.  Gastrointestinal: Negative for blood in stool, constipation  and diarrhea.  Genitourinary: Negative for dysuria.  Musculoskeletal: Negative for back pain, joint pain, myalgias and neck pain.  Skin: Negative for rash.  Neurological: Negative for dizziness and headaches.  Psychiatric/Behavioral: Negative for depression. The patient is not nervous/anxious and does not have insomnia.       Observations/Objective: Physical Exam  Constitutional: She is oriented to person, place, and time.  Neurological: She is alert and oriented to person, place, and time.  Psychiatric: Mood, memory, affect and judgment normal.   Patient reports morning stiffness for 2  hours.   Patient reports nocturnal pain.  Difficulty dressing/grooming: Denies Difficulty climbing stairs: Reports Difficulty getting out of chair: Reports Difficulty using hands for taps, buttons, cutlery, and/or writing: Reports   Assessment and Plan: Visit Diagnoses: Rheumatoid arthritis of multiple sites with negative rheumatoid factor ([place]): She continues to have recurrent rheumatoid arthritis flares.  She has ongoing pain in bilateral shoulder joints, both wrist joints, both hands, right knee joint, and the right ankle joint. She is having severe pain today and rates her RA a 7/10.  She is on Orencia 750 mg IV infusions every 28 days.  Her last infusion was on 11/17/18.  We will add on Arava 10 mg 1 tablet daily.  Indications, contraindications, and potential side effects of Arava were discussed.  Consent was obtained in January 2019.  We will send a new consent form for her to sign and return to our office.  We will send in the prescription for [place] today. She will stay on the reduced dose of 10 mg daily until we see her  back in the office and have update lab work.  She was advised to notify us if she cannot tolerate Arava.  She will follow up in the office in 2 months.   High risk medication use - Orencia 750 mg IV infusions every 28 days.  She will be starting on Arava 10 mg 1 tablet daily. CBC  and CMP were drawn on 11/17/18. She has lab work drawn with her infusions.  Yearly CXR 02/10/18 did not reveal any acute findings. She is not a candidate for anti-TNF's due to her history of congestive heart failure.  She has had an inadequate response to Plaquenil, Enbrel, and Remicade. She developed a rash from Actemra infusions. She discontinued methotrexate due to elevated creatinine and elevated LFTs in the past.    Idiopathic chronic gout of multiple sites without tophus: She has had any recent gout flares.  She is clinically doing well on allopurinol 300 mg 1 tablet by mouth daily.  Primary osteoarthritis of right knee: She continues to have persistent pain and inflammation in the right knee joint. She has limited extension and flexion with discomfort.  She walks with a cane.  H/O total knee replacement, left: Doing well. She walks with a cane.  Positive QuantiFERON-TB Gold test/ patient has yearly chest xrays: CXR did not reveal any acute abnormality on 02/10/18.   DDD (degenerative disc disease), lumbar: She has chronic lower back pain.  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    I discussed the assessment and treatment plan with the patient. The patient was provided an opportunity to ask questions and all were answered. The patient agreed with the plan and demonstrated an understanding of the instructions.   The patient was advised to call back or seek an in-person evaluation if the symptoms worsen or if the condition fails to improve as anticipated.  I provided 25 minutes of non-face-to-face time during this encounter.   Bo Merino, MD   Scribed  by-  Hazel Sams, PA-C

## 2018-11-17 NOTE — Progress Notes (Signed)
Creatinine is elevated.  Please forward labs to her PCP.

## 2018-11-25 ENCOUNTER — Other Ambulatory Visit: Payer: Self-pay | Admitting: *Deleted

## 2018-11-25 NOTE — Progress Notes (Signed)
Infusion orders are current for patient CBC CMP Tylenol Benadryl appointments are up to date and follow up appointment  is scheduled yearly chest x-ray not due yet.

## 2018-12-01 ENCOUNTER — Telehealth (INDEPENDENT_AMBULATORY_CARE_PROVIDER_SITE_OTHER): Payer: Medicare Other | Admitting: Rheumatology

## 2018-12-01 ENCOUNTER — Encounter: Payer: Self-pay | Admitting: Rheumatology

## 2018-12-01 ENCOUNTER — Other Ambulatory Visit: Payer: Self-pay

## 2018-12-01 DIAGNOSIS — N183 Chronic kidney disease, stage 3 unspecified: Secondary | ICD-10-CM

## 2018-12-01 DIAGNOSIS — I5032 Chronic diastolic (congestive) heart failure: Secondary | ICD-10-CM

## 2018-12-01 DIAGNOSIS — R7612 Nonspecific reaction to cell mediated immunity measurement of gamma interferon antigen response without active tuberculosis: Secondary | ICD-10-CM

## 2018-12-01 DIAGNOSIS — Z8679 Personal history of other diseases of the circulatory system: Secondary | ICD-10-CM | POA: Diagnosis not present

## 2018-12-01 DIAGNOSIS — M1711 Unilateral primary osteoarthritis, right knee: Secondary | ICD-10-CM | POA: Diagnosis not present

## 2018-12-01 DIAGNOSIS — Z8639 Personal history of other endocrine, nutritional and metabolic disease: Secondary | ICD-10-CM

## 2018-12-01 DIAGNOSIS — M51369 Other intervertebral disc degeneration, lumbar region without mention of lumbar back pain or lower extremity pain: Secondary | ICD-10-CM

## 2018-12-01 DIAGNOSIS — M0609 Rheumatoid arthritis without rheumatoid factor, multiple sites: Secondary | ICD-10-CM

## 2018-12-01 DIAGNOSIS — Z96652 Presence of left artificial knee joint: Secondary | ICD-10-CM | POA: Diagnosis not present

## 2018-12-01 DIAGNOSIS — Z79899 Other long term (current) drug therapy: Secondary | ICD-10-CM | POA: Diagnosis not present

## 2018-12-01 DIAGNOSIS — M1A09X0 Idiopathic chronic gout, multiple sites, without tophus (tophi): Secondary | ICD-10-CM

## 2018-12-01 DIAGNOSIS — M5136 Other intervertebral disc degeneration, lumbar region: Secondary | ICD-10-CM | POA: Diagnosis not present

## 2018-12-01 DIAGNOSIS — R911 Solitary pulmonary nodule: Secondary | ICD-10-CM | POA: Diagnosis not present

## 2018-12-01 MED ORDER — LEFLUNOMIDE 10 MG PO TABS: 10.0000 mg | ORAL_TABLET | Freq: Every day | ORAL | 2 refills | Status: DC

## 2018-12-01 MED ORDER — LEFLUNOMIDE 10 MG PO TABS: 10.0000 mg | ORAL_TABLET | Freq: Every day | ORAL | 0 refills | Status: DC

## 2018-12-01 NOTE — Addendum Note (Signed)
Addended by: Earnestine Mealing on: 12/01/2018 03:51 PM   Modules accepted: Orders

## 2018-12-01 NOTE — Patient Instructions (Signed)
Leflunomide tablets What is this medicine? LEFLUNOMIDE (le FLOO na mide) is for rheumatoid arthritis. This medicine may be used for other purposes; ask your health care provider or pharmacist if you have questions. COMMON BRAND NAME(S): Arava What should I tell my health care provider before I take this medicine? They need to know if you have any of these conditions:  diabetes  have a fever or infection  high blood pressure  immune system problems  kidney disease  liver disease  low blood cell counts, like low white cell, platelet, or red cell counts  lung or breathing disease, like asthma  recently received or scheduled to receive a vaccine  receiving treatment for cancer  skin conditions or sensitivity  tingling of the fingers or toes, or other nerve disorder  tuberculosis  an unusual or allergic reaction to leflunomide, teriflunomide, other medicines, food, dyes, or preservatives  pregnant or trying to get pregnant  breast-feeding How should I use this medicine? Take this medicine by mouth with a full glass of water. Follow the directions on the prescription label. Take your medicine at regular intervals. Do not take your medicine more often than directed. Do not stop taking except on your doctor's advice. Talk to your pediatrician regarding the use of this medicine in children. Special care may be needed. Overdosage: If you think you have taken too much of this medicine contact a poison control center or emergency room at once. NOTE: This medicine is only for you. Do not share this medicine with others. What if I miss a dose? If you miss a dose, take it as soon as you can. If it is almost time for your next dose, take only that dose. Do not take double or extra doses. What may interact with this medicine? Do not take this medicine with any of the following medications:  teriflunomide This medicine may also interact with the following  medications:  alosetron  birth control pills  caffeine  cefaclor  certain medicines for diabetes like nateglinide, repaglinide, rosiglitazone, pioglitazone  certain medicines for high cholesterol like atorvastatin, pravastatin, rosuvastatin, simvastatin  charcoal  cholestyramine  ciprofloxacin  duloxetine  furosemide  ketoprofen  live virus vaccines  medicines that increase your risk for infection  methotrexate  mitoxantrone  paclitaxel  penicillin  theophylline  tizanidine  warfarin This list may not describe all possible interactions. Give your health care provider a list of all the medicines, herbs, non-prescription drugs, or dietary supplements you use. Also tell them if you smoke, drink alcohol, or use illegal drugs. Some items may interact with your medicine. What should I watch for while using this medicine? Visit your health care provider for regular checks on your progress. Tell your doctor or health care provider if your symptoms do not start to get better or if they get worse. You may need blood work done while you are taking this medicine. This medicine may cause serious skin reactions. They can happen weeks to months after starting the medicine. Contact your health care provider right away if you notice fevers or flu-like symptoms with a rash. The rash may be red or purple and then turn into blisters or peeling of the skin. Or, you might notice a red rash with swelling of the face, lips or lymph nodes in your neck or under your arms. This medicine may stay in your body for up to 2 years after your last dose. Tell your doctor about any unusual side effects or symptoms. A medicine can  be given to help lower your blood levels of this medicine more quickly. Women must use effective birth control with this medicine. There is a potential for serious side effects to an unborn child. Do not become pregnant while taking this medicine. Inform your doctor if you wish  to become pregnant. This medicine remains in your blood after you stop taking it. You must continue using effective birth control until the blood levels have been checked and they are low enough. A medicine can be given to help lower your blood levels of this medicine more quickly. Immediately talk to your doctor if you think you may be pregnant. You may need a pregnancy test. Talk to your health care provider or pharmacist for more information. You should not receive certain vaccines during your treatment and for a certain time after your treatment with this medication ends. Talk to your health care provider for more information. What side effects may I notice from receiving this medicine? Side effects that you should report to your doctor or health care professional as soon as possible:  allergic reactions like skin rash, itching or hives, swelling of the face, lips, or tongue  breathing problems  cough  increased blood pressure  low blood counts - this medicine may decrease the number of white blood cells and platelets. You may be at increased risk for infections and bleeding.  pain, tingling, numbness in the hands or feet  rash, fever, and swollen lymph nodes  redness, blistering, peeing or loosening of the skin, including inside the mouth  signs of decreased platelets or bleeding - bruising, pinpoint red spots on the skin, black, tarry stools, blood in urine  signs of infection - fever or chills, cough, sore throat, pain or trouble passing urine  signs and symptoms of liver injury like dark yellow or brown urine; general ill feeling or flu-like symptoms; light-colored stools; loss of appetite; nausea; right upper belly pain; unusually weak or tired; yellowing of the eyes or skin  trouble passing urine or change in the amount of urine  vomiting Side effects that usually do not require medical attention (report to your doctor or health care professional if they continue or are  bothersome):  diarrhea  hair thinning or loss  headache  nausea  tiredness This list may not describe all possible side effects. Call your doctor for medical advice about side effects. You may report side effects to FDA at 1-800-FDA-1088. Where should I keep my medicine? Keep out of the reach of children. Store at room temperature between 15 and 30 degrees C (59 and 86 degrees F). Protect from moisture and light. Throw away any unused medicine after the expiration date. NOTE: This sheet is a summary. It may not cover all possible information. If you have questions about this medicine, talk to your doctor, pharmacist, or health care provider.  2020 [employer]vier/Gold Standard (2018-03-26 15:06:48)

## 2018-12-07 ENCOUNTER — Other Ambulatory Visit: Payer: Self-pay | Admitting: *Deleted

## 2018-12-07 DIAGNOSIS — R899 Unspecified abnormal finding in specimens from other organs, systems and tissues: Secondary | ICD-10-CM

## 2018-12-15 ENCOUNTER — Other Ambulatory Visit: Payer: Self-pay

## 2018-12-15 ENCOUNTER — Ambulatory Visit (HOSPITAL_COMMUNITY)
Admission: RE | Admit: 2018-12-15 | Discharge: 2018-12-15 | Disposition: A | Discharge status: Home / Self Care | Payer: Medicare Other | Source: Ambulatory Visit | Attending: Rheumatology | Admitting: Rheumatology

## 2018-12-15 ENCOUNTER — Telehealth: Payer: Self-pay | Admitting: Rheumatology

## 2018-12-15 DIAGNOSIS — M0609 Rheumatoid arthritis without rheumatoid factor, multiple sites: Secondary | ICD-10-CM

## 2018-12-15 MED ORDER — SODIUM CHLORIDE 0.9 % IV SOLN
750.0000 mg | INTRAVENOUS | Status: DC
  Filled 2018-12-15: qty 30

## 2018-12-15 MED ORDER — DIPHENHYDRAMINE HCL 25 MG PO CAPS: 25.0000 mg | ORAL_CAPSULE | ORAL | Status: DC

## 2018-12-15 MED ORDER — DIPHENHYDRAMINE HCL 25 MG PO CAPS: 25.0000 mg | ORAL_CAPSULE | Freq: Once | ORAL | Status: AC

## 2018-12-15 MED ORDER — SODIUM CHLORIDE 0.9 % IV SOLN
750.0000 mg | Freq: Once | INTRAVENOUS | Status: AC
  Administered 2018-12-15: 750 mg via INTRAVENOUS
  Filled 2018-12-15: qty 30

## 2018-12-15 MED ORDER — ACETAMINOPHEN 325 MG PO TABS: 650.0000 mg | ORAL_TABLET | ORAL | Status: DC

## 2018-12-15 MED ORDER — ACETAMINOPHEN 325 MG PO TABS: 650.0000 mg | ORAL_TABLET | Freq: Once | ORAL | Status: AC

## 2018-12-15 NOTE — Telephone Encounter (Signed)
Please send in orders for Orencia.

## 2018-12-15 NOTE — Telephone Encounter (Signed)
Infusion orders for orencia have been placed. Returned call to medical day to advise.

## 2018-12-16 ENCOUNTER — Telehealth: Payer: Self-pay

## 2018-12-16 MED ORDER — LEFLUNOMIDE 10 MG PO TABS: 10.0000 mg | ORAL_TABLET | Freq: Every day | ORAL | 2 refills | Status: AC

## 2018-12-16 NOTE — Telephone Encounter (Signed)
Received signed consent for [place].   Dose will be Arava 10 mg 1 tablet daily. Prescription has been sent to the pharmacy, patient is aware.

## 2018-12-22 DIAGNOSIS — M5417 Radiculopathy, lumbosacral region: Secondary | ICD-10-CM | POA: Diagnosis not present

## 2018-12-22 DIAGNOSIS — M545 Low back pain: Secondary | ICD-10-CM | POA: Diagnosis not present

## 2018-12-28 ENCOUNTER — Encounter: Payer: Self-pay | Admitting: Sports Medicine

## 2018-12-28 ENCOUNTER — Ambulatory Visit (INDEPENDENT_AMBULATORY_CARE_PROVIDER_SITE_OTHER): Payer: Medicare Other | Admitting: Sports Medicine

## 2018-12-28 ENCOUNTER — Other Ambulatory Visit: Payer: Self-pay

## 2018-12-28 DIAGNOSIS — L84 Corns and callosities: Secondary | ICD-10-CM

## 2018-12-28 DIAGNOSIS — Z794 Long term (current) use of insulin: Secondary | ICD-10-CM

## 2018-12-28 DIAGNOSIS — E1140 Type 2 diabetes mellitus with diabetic neuropathy, unspecified: Secondary | ICD-10-CM | POA: Diagnosis not present

## 2018-12-28 DIAGNOSIS — M79675 Pain in left toe(s): Secondary | ICD-10-CM | POA: Diagnosis not present

## 2018-12-28 DIAGNOSIS — B351 Tinea unguium: Secondary | ICD-10-CM | POA: Diagnosis not present

## 2018-12-28 DIAGNOSIS — M79674 Pain in right toe(s): Secondary | ICD-10-CM

## 2018-12-28 NOTE — Patient Instructions (Signed)
[employer]fit compression socks/stockings from [facility]

## 2018-12-28 NOTE — Progress Notes (Signed)
Patient ID: [name], female   DOB: [birth year], 78 y.o.   MRN: [id number]  Subjective: [name] is a 78 y.o. female patient with history of type 2 diabetes who returns to office today complaining of long, painful nails and callus while ambulating in shoes; unable to trim. Patient states that the glucose reading this morning was 124mg /dl. Patient last A1c was 6.4. Patient denies any other symptoms.   Patient Active Problem List   Diagnosis Date Noted  . Near syncope 02/12/2018  . Orthostatic hypotension 02/12/2018  . Symptomatic bradycardia 02/10/2018  . Primary osteoarthritis of right knee 09/25/2016  . 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 Elevated d-dimer   . Encephalopathy   . Sinus bradycardia   . Acute encephalopathy 02/27/2016  . Seropositive rheumatoid arthritis of multiple sites ([place]) 01/17/2016  . Bilateral hand pain 01/17/2016  . Bilateral wrist pain 01/17/2016  . Pain in right elbow 01/17/2016  . Left shoulder pain 01/17/2016  . Encounter for therapeutic drug monitoring 11/02/2015  . Bronchiectasis without complication ([place]) 27/78/2423  . Chronic gout involving toe without tophus 10/30/2015  . High risk medication use 10/29/2015  . Gout 10/29/2015  . H/O total knee replacement, left 10/29/2015  . 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 Diabetes mellitus, type II, insulin dependent ([place]) 10/18/2015  . AKI (acute kidney injury) ([place]) 10/18/2015  . CKD (chronic kidney disease) stage 3, GFR 30-59 ml/min ([facility]) 10/18/2015  . Nodule of left lung 10/18/2015  . Neuropathy 08/22/2014  . Hypothyroidism 08/22/2014  . Chronic pain 08/22/2014  . Chronic congestive heart failure with left ventricular diastolic dysfunction ([place]) 08/08/2013  . Chest pain 08/05/2013  . Aortic stenosis 12/22/2012  . Hyperlipidemia 12/22/2012  . PVC (premature ventricular contraction) 12/22/2012  . HTN (hypertension)  12/22/2012  . 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 Open angle with borderline findings, high risk 12/15/2011  . Cataract, nuclear 05/25/2011   Current Outpatient Medications on File Prior to Visit  Medication Sig Dispense Refill  . abatacept 750 mg in sodium chloride 0.9 % 70 mL Inject 750 mg into the vein every 28 (twenty-eight) days.    Marland Kitchen acetaminophen (TYLENOL) 500 MG tablet Take 1,000 mg by mouth every 6 (six) hours as needed for mild pain.    Marland Kitchen aspirin EC 81 MG EC tablet Take 1 tablet (81 mg total) by mouth daily. 30 tablet 0  . atorvastatin (LIPITOR) 80 MG tablet Take 80 mg by mouth daily at 6 PM.     . BD INSULIN SYRINGE ULTRAFINE 31G X 15/64" 0.3 ML MISC     . cholecalciferol (VITAMIN D) 1000 UNITS tablet Take 1,000 Units by mouth daily.     . clotrimazole-betamethasone (LOTRISONE) cream Apply 1 application topically as needed.     . cyclobenzaprine (FLEXERIL) 10 MG tablet Take 10 mg by mouth daily as needed for muscle spasms.     . ferrous sulfate (FER-IN-SOL) 75 (15 Fe) MG/ML SOLN Take 45 mg of iron by mouth daily.    . folic acid (FOLVITE) 1 MG tablet Take 1 mg by mouth daily.    Marland Kitchen HYDROcodone-acetaminophen (NORCO) 5-325 MG tablet Take 1 tablet by mouth every 6 (six) hours as needed for moderate pain. 21 tablet 0  . leflunomide (ARAVA) 10 MG tablet Take 1 tablet (10 mg total) by mouth daily. 30 tablet 2  . levothyroxine (SYNTHROID, LEVOTHROID) 200 MCG tablet Take 1 tablet (200 mcg total) by mouth daily at  6 (six) AM. 30 tablet 0  . losartan (COZAAR) 50 MG tablet Take 50 mg by mouth daily.    Marland Kitchen NOVOLIN 70/30 RELION (70-30) 100 UNIT/ML injection INJECT 6 TO 8 UNITS SUBCUTANEOUSLY THREE TIMES DAILY WITH MEALS    . NYSTATIN powder APPLY AS DIRECTED TOPICALLY TWICE DAILY AS NEEDED 7 DAYS    . pregabalin (LYRICA) 75 MG capsule 1 bid 28 capsule 0  . pyridOXINE (VITAMIN B-6) 100 MG tablet Take 100 mg by mouth daily.    Marland Kitchen VITAMIN E PO Take 1 capsule by mouth daily.     Marland Kitchen allopurinol  (ZYLOPRIM) 300 MG tablet Take 1 tablet (300 mg total) by mouth daily. 30 tablet 5   No current facility-administered medications on file prior to visit.   Allergies  Allergen Reactions  . Methotrexate Derivatives Other (See Comments)    Elevated creat. Levels  . Other Other (See Comments)    NO Blood products.  [name]- causes low blood sugar (pt states she was taking medication but cant remember what it was for.)  . Penicillins Rash    Has patient had a PCN reaction causing immediate rash, facial/tongue/throat swelling, SOB or lightheadedness with hypotension: Yes Has patient had a PCN reaction causing severe rash involving mucus membranes or skin necrosis: Yes  Has patient had a PCN reaction that required hospitalization No Has patient had a PCN reaction occurring within the last 10 years: No If all of the above answers are "NO", then may proceed with Cephalosporin use.   . Remicade [Infliximab] Other (See Comments)    Inadequate response   . Zocor [Simvastatin] Other (See Comments)    Leg cramps     Objective: General: Patient is awake, alert, and oriented x 3 and in no acute distress.  Integument: Skin is warm, dry and supple bilateral. Nails are tender, long, thickened and dystrophic with subungual debris, consistent with onychomycosis, 1-5 bilateral. No open lesions. + callus/ preulcerative lesions present at bilateral 4th toes with no signs of infection. Remaining integument unremarkable.  Vasculature:  Dorsalis Pedis pulse 1/4 bilateral. Posterior Tibial pulse  1/4 bilateral. Capillary fill time <3 sec 1-5 bilateral. Scant hair growth to the level of the digits.Temperature gradient within normal limits. No varicosities present bilateral. Trace edema present bilateral.   Neurology: The patient has diminished sensation measured with a 5.07/10g Semmes Weinstein Monofilament at all pedal sites bilateral . Vibratory sensation diminished bilateral with tuning fork. No Babinski  sign present bilateral.   Musculoskeletal: +Bunion and hammertoe pedal deformities noted bilateral.  No reproducible right ankle pain noted at today's visit but swelling.  Pes planus foot type. Muscular strength 5/5 in all lower extremity muscular groups bilateral. No tenderness with calf compression bilateral.   Assessment and Plan: Problem List Items Addressed This Visit    None    Visit Diagnoses    Pain due to onychomycosis of toenails of both feet    -  Primary   Pre-ulcerative calluses       Type 2 diabetes mellitus with diabetic neuropathy, with long-term current use of insulin ([place])         -Examined patient. -Re-educated patient on diabetic foot care, especially with  regards to the vascular, neurological and musculoskeletal systems.  -Mechanically debrided all nails 1-5 bilateral using sterile nail nipper and filed with dremel without incident  -At no additional charge, Debrided callus at right and left 4th toes using sterile chisel blade without incident. -Recommend to continue with toe caps -Recommend  copper fit compression sock -Patient to return as in 10-12 weeks for nail and callus care  -Patient also advised to make appointment next year for diabetic shoes -Patient advised to call the office if any problems or questions arise in the meantime.  Landis Martins, DPM

## 2019-01-03 ENCOUNTER — Other Ambulatory Visit: Payer: Self-pay | Admitting: Internal Medicine

## 2019-01-03 DIAGNOSIS — R6889 Other general symptoms and signs: Secondary | ICD-10-CM

## 2019-01-03 DIAGNOSIS — Z9889 Other specified postprocedural states: Secondary | ICD-10-CM

## 2019-01-11 ENCOUNTER — Ambulatory Visit: Payer: Medicare Other | Admitting: Orthotics

## 2019-01-11 ENCOUNTER — Other Ambulatory Visit: Payer: Self-pay | Admitting: *Deleted

## 2019-01-11 ENCOUNTER — Other Ambulatory Visit: Payer: Self-pay

## 2019-01-11 DIAGNOSIS — B351 Tinea unguium: Secondary | ICD-10-CM

## 2019-01-11 DIAGNOSIS — M0609 Rheumatoid arthritis without rheumatoid factor, multiple sites: Secondary | ICD-10-CM

## 2019-01-11 DIAGNOSIS — E1140 Type 2 diabetes mellitus with diabetic neuropathy, unspecified: Secondary | ICD-10-CM

## 2019-01-11 DIAGNOSIS — L84 Corns and callosities: Secondary | ICD-10-CM

## 2019-01-11 DIAGNOSIS — M2040 Other hammer toe(s) (acquired), unspecified foot: Secondary | ICD-10-CM

## 2019-01-11 DIAGNOSIS — M2141 Flat foot [pes planus] (acquired), right foot: Secondary | ICD-10-CM

## 2019-01-11 DIAGNOSIS — M19071 Primary osteoarthritis, right ankle and foot: Secondary | ICD-10-CM

## 2019-01-11 DIAGNOSIS — M79674 Pain in right toe(s): Secondary | ICD-10-CM

## 2019-01-11 NOTE — Progress Notes (Signed)

## 2019-01-12 ENCOUNTER — Telehealth: Payer: Self-pay | Admitting: Rheumatology

## 2019-01-12 ENCOUNTER — Ambulatory Visit (HOSPITAL_COMMUNITY)
Admission: RE | Admit: 2019-01-12 | Discharge: 2019-01-12 | Disposition: A | Discharge status: Home / Self Care | Payer: Medicare Other | Source: Ambulatory Visit | Attending: Rheumatology | Admitting: Rheumatology

## 2019-01-12 DIAGNOSIS — M0609 Rheumatoid arthritis without rheumatoid factor, multiple sites: Secondary | ICD-10-CM | POA: Diagnosis not present

## 2019-01-12 MED ORDER — DIPHENHYDRAMINE HCL 25 MG PO CAPS: 25.0000 mg | ORAL_CAPSULE | ORAL | Status: DC

## 2019-01-12 MED ORDER — SODIUM CHLORIDE 0.9 % IV SOLN
750.0000 mg | INTRAVENOUS | Status: DC
  Administered 2019-01-12: 14:00:00 750 mg via INTRAVENOUS
  Filled 2019-01-12: qty 30

## 2019-01-12 MED ORDER — ACETAMINOPHEN 325 MG PO TABS: 650.0000 mg | ORAL_TABLET | ORAL | Status: DC

## 2019-01-12 NOTE — Telephone Encounter (Signed)
Attempted to call [facility] and no answer. Attempted to contact patient and no answer/no voicemail available, phone asked for remote access code.

## 2019-01-12 NOTE — Progress Notes (Signed)
Pt here today for orencia.  Unable to obtain PIV and blood work.  Called IV team who was able to get an IV site to administer orencia but the PIV would not draw back blood to obtain blood work.  Attempted to use a butterfly needle to obtain blood work and again was unccessful.  Called Dr Arlean Hopping office and left a message letting them know we were not able to obtain her blood work in medical day today.

## 2019-01-12 NOTE — Telephone Encounter (Signed)
What is the reason the patient could not have blood drawn?  Can she go to the lab?

## 2019-01-12 NOTE — Telephone Encounter (Signed)
[facility] calling to let you know patient was able to have treatment today, but they could not get labs on her.

## 2019-01-13 ENCOUNTER — Ambulatory Visit
Admission: RE | Admit: 2019-01-13 | Discharge: 2019-01-13 | Disposition: A | Discharge status: Home / Self Care | Payer: Medicare Other | Source: Ambulatory Visit | Attending: Internal Medicine | Admitting: Internal Medicine

## 2019-01-13 DIAGNOSIS — E041 Nontoxic single thyroid nodule: Secondary | ICD-10-CM | POA: Diagnosis not present

## 2019-01-13 DIAGNOSIS — R6889 Other general symptoms and signs: Secondary | ICD-10-CM

## 2019-01-13 DIAGNOSIS — Z9889 Other specified postprocedural states: Secondary | ICD-10-CM

## 2019-01-13 NOTE — Telephone Encounter (Signed)
Okay to wait on blood work until the next infusion.

## 2019-01-13 NOTE — Telephone Encounter (Signed)
Patient states she when she was there for her infusion they had to call the iv team in to put her iv in and they were unable to obtain a blood sample. Patient would like to know if she can wait until her next infusion for the blood work. Please advise.

## 2019-01-13 NOTE — Telephone Encounter (Signed)
Advised patient Okay to wait on blood work until the next infusion. Patient verbalized understanding.

## 2019-01-14 ENCOUNTER — Ambulatory Visit: Payer: Medicare Other | Admitting: Podiatry

## 2019-01-21 ENCOUNTER — Ambulatory Visit: Payer: Medicare Other | Admitting: Podiatry

## 2019-01-31 DIAGNOSIS — M545 Low back pain: Secondary | ICD-10-CM | POA: Diagnosis not present

## 2019-02-03 ENCOUNTER — Other Ambulatory Visit: Payer: Self-pay | Admitting: *Deleted

## 2019-02-03 NOTE — Progress Notes (Signed)
Infusion orders are current for patient CBC CMP Tylenol Benadryl appointments are up to date and follow up appointment  is scheduled yearly chest x-ray due 02/11/19.

## 2019-02-08 ENCOUNTER — Other Ambulatory Visit (HOSPITAL_COMMUNITY): Payer: Self-pay | Admitting: *Deleted

## 2019-02-08 ENCOUNTER — Ambulatory Visit: Payer: Medicare Other | Admitting: Rheumatology

## 2019-02-09 ENCOUNTER — Inpatient Hospital Stay (HOSPITAL_COMMUNITY): Admission: RE | Admit: 2019-02-09 | Payer: Medicare Other | Source: Ambulatory Visit

## 2019-02-10 ENCOUNTER — Other Ambulatory Visit: Payer: Self-pay | Admitting: Family Medicine

## 2019-02-10 DIAGNOSIS — M7989 Other specified soft tissue disorders: Secondary | ICD-10-CM

## 2019-02-14 ENCOUNTER — Emergency Department (HOSPITAL_COMMUNITY): Payer: Medicare Other

## 2019-02-14 ENCOUNTER — Inpatient Hospital Stay (HOSPITAL_COMMUNITY)
Admission: EM | Admit: 2019-02-14 | Discharge: 2019-02-27 | DRG: 177 | Disposition: A | Discharge status: Home Health | Payer: Medicare Other | Attending: Internal Medicine | Admitting: Internal Medicine

## 2019-02-14 ENCOUNTER — Other Ambulatory Visit: Payer: Self-pay

## 2019-02-14 DIAGNOSIS — R0902 Hypoxemia: Secondary | ICD-10-CM | POA: Diagnosis not present

## 2019-02-14 DIAGNOSIS — G9341 Metabolic encephalopathy: Secondary | ICD-10-CM | POA: Diagnosis present

## 2019-02-14 DIAGNOSIS — R1084 Generalized abdominal pain: Secondary | ICD-10-CM | POA: Diagnosis not present

## 2019-02-14 DIAGNOSIS — E785 Hyperlipidemia, unspecified: Secondary | ICD-10-CM | POA: Diagnosis present

## 2019-02-14 DIAGNOSIS — Z531 Procedure and treatment not carried out because of patient's decision for reasons of belief and group pressure: Secondary | ICD-10-CM | POA: Diagnosis present

## 2019-02-14 DIAGNOSIS — Z794 Long term (current) use of insulin: Secondary | ICD-10-CM | POA: Diagnosis not present

## 2019-02-14 DIAGNOSIS — U071 COVID-19: Secondary | ICD-10-CM | POA: Diagnosis not present

## 2019-02-14 DIAGNOSIS — M1990 Unspecified osteoarthritis, unspecified site: Secondary | ICD-10-CM | POA: Diagnosis present

## 2019-02-14 DIAGNOSIS — R4182 Altered mental status, unspecified: Secondary | ICD-10-CM

## 2019-02-14 DIAGNOSIS — N184 Chronic kidney disease, stage 4 (severe): Secondary | ICD-10-CM | POA: Diagnosis present

## 2019-02-14 DIAGNOSIS — N170 Acute kidney failure with tubular necrosis: Secondary | ICD-10-CM | POA: Diagnosis present

## 2019-02-14 DIAGNOSIS — G894 Chronic pain syndrome: Secondary | ICD-10-CM | POA: Diagnosis not present

## 2019-02-14 DIAGNOSIS — E890 Postprocedural hypothyroidism: Secondary | ICD-10-CM | POA: Diagnosis present

## 2019-02-14 DIAGNOSIS — R197 Diarrhea, unspecified: Secondary | ICD-10-CM | POA: Diagnosis present

## 2019-02-14 DIAGNOSIS — I447 Left bundle-branch block, unspecified: Secondary | ICD-10-CM | POA: Diagnosis not present

## 2019-02-14 DIAGNOSIS — N1832 Chronic kidney disease, stage 3b: Secondary | ICD-10-CM

## 2019-02-14 DIAGNOSIS — Z96652 Presence of left artificial knee joint: Secondary | ICD-10-CM | POA: Diagnosis present

## 2019-02-14 DIAGNOSIS — Z841 Family history of disorders of kidney and ureter: Secondary | ICD-10-CM

## 2019-02-14 DIAGNOSIS — M059 Rheumatoid arthritis with rheumatoid factor, unspecified: Secondary | ICD-10-CM | POA: Diagnosis present

## 2019-02-14 DIAGNOSIS — J1282 Pneumonia due to coronavirus disease 2019: Secondary | ICD-10-CM | POA: Diagnosis not present

## 2019-02-14 DIAGNOSIS — N179 Acute kidney failure, unspecified: Secondary | ICD-10-CM | POA: Diagnosis not present

## 2019-02-14 DIAGNOSIS — D573 Sickle-cell trait: Secondary | ICD-10-CM | POA: Diagnosis present

## 2019-02-14 DIAGNOSIS — E1122 Type 2 diabetes mellitus with diabetic chronic kidney disease: Secondary | ICD-10-CM | POA: Diagnosis present

## 2019-02-14 DIAGNOSIS — I350 Nonrheumatic aortic (valve) stenosis: Secondary | ICD-10-CM | POA: Diagnosis present

## 2019-02-14 DIAGNOSIS — I130 Hypertensive heart and chronic kidney disease with heart failure and stage 1 through stage 4 chronic kidney disease, or unspecified chronic kidney disease: Secondary | ICD-10-CM | POA: Diagnosis present

## 2019-02-14 DIAGNOSIS — E119 Type 2 diabetes mellitus without complications: Secondary | ICD-10-CM | POA: Diagnosis not present

## 2019-02-14 DIAGNOSIS — Z833 Family history of diabetes mellitus: Secondary | ICD-10-CM

## 2019-02-14 DIAGNOSIS — Z66 Do not resuscitate: Secondary | ICD-10-CM | POA: Diagnosis present

## 2019-02-14 DIAGNOSIS — Z86718 Personal history of other venous thrombosis and embolism: Secondary | ICD-10-CM

## 2019-02-14 DIAGNOSIS — E7800 Pure hypercholesterolemia, unspecified: Secondary | ICD-10-CM | POA: Diagnosis present

## 2019-02-14 DIAGNOSIS — Z7401 Bed confinement status: Secondary | ICD-10-CM

## 2019-02-14 DIAGNOSIS — E039 Hypothyroidism, unspecified: Secondary | ICD-10-CM | POA: Diagnosis present

## 2019-02-14 DIAGNOSIS — G8929 Other chronic pain: Secondary | ICD-10-CM | POA: Diagnosis present

## 2019-02-14 DIAGNOSIS — E875 Hyperkalemia: Secondary | ICD-10-CM | POA: Diagnosis not present

## 2019-02-14 DIAGNOSIS — D631 Anemia in chronic kidney disease: Secondary | ICD-10-CM | POA: Diagnosis present

## 2019-02-14 DIAGNOSIS — Z823 Family history of stroke: Secondary | ICD-10-CM

## 2019-02-14 DIAGNOSIS — M0579 Rheumatoid arthritis with rheumatoid factor of multiple sites without organ or systems involvement: Secondary | ICD-10-CM | POA: Diagnosis not present

## 2019-02-14 DIAGNOSIS — R778 Other specified abnormalities of plasma proteins: Secondary | ICD-10-CM

## 2019-02-14 DIAGNOSIS — E1142 Type 2 diabetes mellitus with diabetic polyneuropathy: Secondary | ICD-10-CM | POA: Diagnosis present

## 2019-02-14 DIAGNOSIS — Z87891 Personal history of nicotine dependence: Secondary | ICD-10-CM

## 2019-02-14 DIAGNOSIS — Z880 Allergy status to penicillin: Secondary | ICD-10-CM

## 2019-02-14 DIAGNOSIS — H409 Unspecified glaucoma: Secondary | ICD-10-CM | POA: Diagnosis present

## 2019-02-14 DIAGNOSIS — I10 Essential (primary) hypertension: Secondary | ICD-10-CM | POA: Diagnosis present

## 2019-02-14 DIAGNOSIS — Z8673 Personal history of transient ischemic attack (TIA), and cerebral infarction without residual deficits: Secondary | ICD-10-CM

## 2019-02-14 DIAGNOSIS — Z8249 Family history of ischemic heart disease and other diseases of the circulatory system: Secondary | ICD-10-CM

## 2019-02-14 DIAGNOSIS — Z888 Allergy status to other drugs, medicaments and biological substances status: Secondary | ICD-10-CM

## 2019-02-14 DIAGNOSIS — N183 Chronic kidney disease, stage 3 unspecified: Secondary | ICD-10-CM | POA: Diagnosis present

## 2019-02-14 DIAGNOSIS — R110 Nausea: Secondary | ICD-10-CM | POA: Diagnosis not present

## 2019-02-14 DIAGNOSIS — A419 Sepsis, unspecified organism: Secondary | ICD-10-CM | POA: Diagnosis not present

## 2019-02-14 DIAGNOSIS — Z79890 Hormone replacement therapy (postmenopausal): Secondary | ICD-10-CM

## 2019-02-14 DIAGNOSIS — R52 Pain, unspecified: Secondary | ICD-10-CM | POA: Diagnosis not present

## 2019-02-14 DIAGNOSIS — M069 Rheumatoid arthritis, unspecified: Secondary | ICD-10-CM | POA: Diagnosis present

## 2019-02-14 DIAGNOSIS — Z209 Contact with and (suspected) exposure to unspecified communicable disease: Secondary | ICD-10-CM | POA: Diagnosis not present

## 2019-02-14 DIAGNOSIS — Z7982 Long term (current) use of aspirin: Secondary | ICD-10-CM

## 2019-02-14 DIAGNOSIS — G4733 Obstructive sleep apnea (adult) (pediatric): Secondary | ICD-10-CM | POA: Diagnosis present

## 2019-02-14 DIAGNOSIS — Z79899 Other long term (current) drug therapy: Secondary | ICD-10-CM

## 2019-02-14 DIAGNOSIS — R7989 Other specified abnormal findings of blood chemistry: Secondary | ICD-10-CM | POA: Diagnosis present

## 2019-02-14 LAB — CBC WITH DIFFERENTIAL/PLATELET
Abs Immature Granulocytes: 0.06 10*3/uL (ref 0.00–0.07)
Basophils Absolute: 0 10*3/uL (ref 0.0–0.1)
Basophils Relative: 0 %
Eosinophils Absolute: 0 10*3/uL (ref 0.0–0.5)
Eosinophils Relative: 0 %
HCT: 21 % — ABNORMAL LOW (ref 36.0–46.0)
Hemoglobin: 7 g/dL — ABNORMAL LOW (ref 12.0–15.0)
Immature Granulocytes: 1 %
Lymphocytes Relative: 27 %
Lymphs Abs: 3.1 10*3/uL (ref 0.7–4.0)
MCH: 29.4 pg (ref 26.0–34.0)
MCHC: 33.3 g/dL (ref 30.0–36.0)
MCV: 88.2 fL (ref 80.0–100.0)
Monocytes Absolute: 0.4 10*3/uL (ref 0.1–1.0)
Monocytes Relative: 4 %
Neutro Abs: 7.8 10*3/uL — ABNORMAL HIGH (ref 1.7–7.7)
Neutrophils Relative %: 68 %
Platelets: 206 10*3/uL (ref 150–400)
RBC: 2.38 MIL/uL — ABNORMAL LOW (ref 3.87–5.11)
RDW: 15.8 % — ABNORMAL HIGH (ref 11.5–15.5)
WBC: 11.5 10*3/uL — ABNORMAL HIGH (ref 4.0–10.5)
nRBC: 0 % (ref 0.0–0.2)

## 2019-02-14 LAB — URINALYSIS, ROUTINE W REFLEX MICROSCOPIC
Bilirubin Urine: NEGATIVE
Glucose, UA: NEGATIVE mg/dL
Ketones, ur: NEGATIVE mg/dL
Leukocytes,Ua: NEGATIVE
Nitrite: NEGATIVE
Protein, ur: >=300 mg/dL — AB
Specific Gravity, Urine: 1.014 (ref 1.005–1.030)
pH: 5 (ref 5.0–8.0)

## 2019-02-14 LAB — POC OCCULT BLOOD, ED: Fecal Occult Bld: NEGATIVE

## 2019-02-14 LAB — LACTIC ACID, PLASMA: Lactic Acid, Venous: 1 mmol/L (ref 0.5–1.9)

## 2019-02-14 LAB — POC SARS CORONAVIRUS 2 AG -  ED: SARS Coronavirus 2 Ag: POSITIVE — AB

## 2019-02-14 LAB — BRAIN NATRIURETIC PEPTIDE: B Natriuretic Peptide: 73 pg/mL (ref 0.0–100.0)

## 2019-02-14 MED ORDER — SODIUM CHLORIDE 0.9 % IV SOLN
1.0000 g | INTRAVENOUS | Status: DC
  Administered 2019-02-14: 23:00:00 1 g via INTRAVENOUS
  Filled 2019-02-14: qty 10

## 2019-02-14 MED ORDER — VANCOMYCIN VARIABLE DOSE PER UNSTABLE RENAL FUNCTION (PHARMACIST DOSING): Status: DC

## 2019-02-14 MED ORDER — SODIUM CHLORIDE 0.9 % IV BOLUS (SEPSIS)
1000.0000 mL | Freq: Once | INTRAVENOUS | Status: AC
  Administered 2019-02-14: 23:00:00 1000 mL via INTRAVENOUS

## 2019-02-14 MED ORDER — VANCOMYCIN HCL IN DEXTROSE 1-5 GM/200ML-% IV SOLN
1000.0000 mg | Freq: Once | INTRAVENOUS | Status: AC
  Administered 2019-02-15: 1000 mg via INTRAVENOUS
  Filled 2019-02-14: qty 200

## 2019-02-14 MED ORDER — SODIUM CHLORIDE 0.9 % IV BOLUS (SEPSIS)
1000.0000 mL | Freq: Once | INTRAVENOUS | Status: AC
  Administered 2019-02-15: 1000 mL via INTRAVENOUS

## 2019-02-14 MED ORDER — METRONIDAZOLE IN NACL 5-0.79 MG/ML-% IV SOLN
500.0000 mg | Freq: Three times a day (TID) | INTRAVENOUS | Status: DC
  Administered 2019-02-14: 500 mg via INTRAVENOUS
  Filled 2019-02-14 (×3): qty 100

## 2019-02-14 NOTE — ED Notes (Signed)
Attempted IV x2, unsuccessful.  

## 2019-02-14 NOTE — ED Notes (Signed)
Please call daughter [name] @ [phone] for a status update--2nd call--Exa Bomba

## 2019-02-14 NOTE — Sepsis Progress Note (Signed)
Notified provider of need to order antibiotics.   

## 2019-02-14 NOTE — ED Triage Notes (Signed)
Pt here from home with ams, n/v/d, upper quadrant abdominal pain, decreased appetite, fatigue, and decreased mobility x 5 days - 1 week. No sick contacts. Pt has been bed bound all week. No verbal response to EMS. Pt is A/O x 4 and ambulatory at baseline.

## 2019-02-14 NOTE — Sepsis Progress Note (Signed)
Notified bedside nurse of need to draw and administer LA and fluid bolus.  Nurse and Lab tech having very difficult time drawing labs and getting IV start.Marland Kitchen

## 2019-02-14 NOTE — ED Notes (Signed)
Patient daughter [name] calling asking for an update [phone]

## 2019-02-14 NOTE — Progress Notes (Addendum)
Pharmacy Antibiotic Note  [name] is a 79 y.o. female admitted on 02/14/2019 with sepsis.  Pharmacy has been consulted for vancomycin dosing. Originally also consulted for Zosyn, but with penicillin allergy, pharmacy recommended ceftriaxone and Flagyl.  Tmax 102.9 F. Patient was found to be positive for COVID-19. Scr this admission unknown at this time d/t issues with getting labs. Will follow labs for further vancomycin dosing. Initial antibiotics delayed due to suspicion of COVID-19 infection and delay with obtaining labs/IV access.   Plan: - Vancomycin loading dose 1g IV x1 - Ceftriaxone 1g IV q24hr - Flagyl 500 mg IV q8hr - Monitor renal function for further vancomycin doses, clinical improvement, c/s, vancomycin levels as indicated     Temp (24hrs), Avg:102.9 F (39.4 C), Min:102.9 F (39.4 C), Max:102.9 F (39.4 C)  No results for input(s): WBC, CREATININE, LATICACIDVEN, VANCOTROUGH, VANCOPEAK, VANCORANDOM, GENTTROUGH, GENTPEAK, GENTRANDOM, TOBRATROUGH, TOBRAPEAK, TOBRARND, AMIKACINPEAK, AMIKACINTROU, AMIKACIN in the last 168 hours.  CrCl cannot be calculated (Patient's most recent lab result is older than the maximum 21 days allowed.).    Allergies  Allergen Reactions  . Methotrexate Derivatives Other (See Comments)    Elevated creat. Levels  . Actemra [Tocilizumab] Rash  . Other Other (See Comments)    NO Blood products.  [name]- causes low blood sugar (pt states she was taking medication but cant remember what it was for.)  . Penicillins Rash    Has patient had a PCN reaction causing immediate rash, facial/tongue/throat swelling, SOB or lightheadedness with hypotension: Yes Has patient had a PCN reaction causing severe rash involving mucus membranes or skin necrosis: Yes  Has patient had a PCN reaction that required hospitalization No Has patient had a PCN reaction occurring within the last 10 years: No If all of the above answers are "NO", then may proceed  with Cephalosporin use.   . Remicade [Infliximab] Other (See Comments)    Inadequate response   . Zocor [Simvastatin] Other (See Comments)    Leg cramps     Antimicrobials this admission: Vancomycin 2/8 >>  Ceftriaxone 2/8 >>  Flagyl 2/8 >>  Dose adjustments this admission:   Microbiology results: 2/8 BCx: sent 2/8 UCx: sent   Thank you for allowing pharmacy to be a part of this patient's care.  Agnes Lawrence, PharmD PGY1 Pharmacy Resident

## 2019-02-14 NOTE — ED Provider Notes (Signed)
[facility] EMERGENCY DEPARTMENT Provider Note   CSN: [id number] Arrival date & time: 02/14/19  1812     History Chief Complaint  Patient presents with  . Altered Mental Status    [name] is a 79 y.o. female history of aortic stenosis, hypertension, hyperlipidemia, hypothyroidism, CKD, CHF, presented to emergency department with generalized fatigue and fever.  Patient is quite lethargic on arrival and will wake up and mumble but does not answer any further questions.  EMS reports that the family called 84 today because the patient has been extremely weak and lethargic for the past 5 days.  Apparently she has been bedbound, when normally she is able to walk around and is fairly independent.  She has been eating very little for the past several days.  There were some family members were positive for Covid several weeks or months ago, but they have not noticed that the patient had any cough or respiratory symptoms.  Daughter Butch Penny tells me patient was reporting pain in her left lower abdomen and was "rubbing" at it.  She has a hx of diverticulitis that was treated in the past.  She has had diarrhea for the past several days as well.    Patient's other daughter and her family had COVID, but were living upstairs.  Patient received 1st dose of vaccine two weeks ago.  Jehovah's witness - no blood products  HPI     Past Medical History:  Diagnosis Date  . Allergic rhinitis   . Anemia   . Aortic stenosis 12/22/2012   Mild 3/14-2.35 m/s peak velocity  . Cataract 2018  . CHF (congestive heart failure) ([place])   . Chronic pain   . CKD (chronic kidney disease), stage III   . Diabetes mellitus   . Diverticulosis   . DVT (deep venous thrombosis) ([place])   . Glaucoma   . Hemorrhoids    internal  . HTN (hypertension)   . Hypercholesteremia   . Hyperlipidemia 12/22/2012  . Hypothyroidism   . Meningioma ([place]) 2008  . Osteoarthritis   . Peripheral neuropathy     . PPD positive    6 months ago  . PVC (premature ventricular contraction) 12/22/2012  . Rheumatoid arthritis(714.0)   . Sickle cell trait ([place])   . 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 Acute renal failure superimposed on stage 3 chronic kidney disease ([place]) 02/15/2019  . Elevated troponin 02/15/2019  . LBBB (left bundle branch block) 02/15/2019  . Refusal of blood transfusions as patient is Jehovah's Witness   . Near syncope 02/12/2018  . Orthostatic hypotension 02/12/2018  . Symptomatic bradycardia 02/10/2018  . Primary osteoarthritis of right knee 09/25/2016  . 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 Elevated d-dimer   . Encephalopathy   . Sinus bradycardia   . Acute encephalopathy 02/27/2016  . 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 Bilateral hand pain 01/17/2016  . Bilateral wrist pain 01/17/2016  . Pain in right elbow 01/17/2016  . Left shoulder pain 01/17/2016  . Encounter for therapeutic drug monitoring 11/02/2015  . Bronchiectasis without complication ([place]) 77/82/4235  . Chronic gout involving toe without tophus 10/30/2015  . High risk medication use 10/29/2015  . Gout 10/29/2015  . H/O total knee replacement, left 10/29/2015  . 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 Diabetes mellitus, type II, insulin dependent ([place]) 10/18/2015  . AKI (acute kidney injury) ([place]) 10/18/2015  . CKD (chronic kidney disease) stage 3, GFR 30-59 ml/min ([facility]) 10/18/2015  . Nodule of left lung 10/18/2015  . Neuropathy 08/22/2014  . Hypothyroidism 08/22/2014  . Chronic pain 08/22/2014  . Chronic congestive heart failure with left ventricular diastolic dysfunction ([place]) 08/08/2013  . Chest pain 08/05/2013  . Aortic stenosis 12/22/2012  . Hyperlipidemia 12/22/2012  . PVC (premature  ventricular contraction) 12/22/2012  . HTN (hypertension) 12/22/2012  . 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 Open angle with borderline findings, high risk 12/15/2011  . Cataract, nuclear 05/25/2011    Past Surgical History:  Procedure Laterality Date  . ABDOMINAL HYSTERECTOMY  1978   TAH,& BSO 1  . CATARACT EXTRACTION Bilateral   . EYE SURGERY    . fistula repair  03/2010   partial colectomy  . intracranial surgery  11/2007   removal of meningioma  . JOINT REPLACEMENT  2001   left TKR  . LEFT HEART CATHETERIZATION WITH CORONARY ANGIOGRAM N/A 08/08/2013   Procedure: LEFT HEART CATHETERIZATION WITH CORONARY ANGIOGRAM;  Surgeon: Peter M Martinique, MD;  Location: [facility] CATH LAB;  Service: Cardiovascular;  Laterality: N/A;  . ROTATOR CUFF REPAIR  1999   BIlateral  . SIGMOIDECTOMY  2012  . TOE SURGERY Left 2018  . TONSILLECTOMY AND ADENOIDECTOMY    . TOTAL THYROIDECTOMY  1992  . wrist sx     right      OB History   No obstetric history on file.     Family History  Problem Relation Age of Onset  . Hypotension Mother   . CVA Mother   . Diabetes Mother   . Hypertension Father   . Kidney failure Father     Social History   Tobacco Use  . Smoking status: Former Smoker    Packs/day: 0.10    Years: 2.00    Pack years: 0.20    Types: Cigarettes    Quit date: 01/06/1962    Years since quitting: 57.1  . Smokeless tobacco: Never Used  . Tobacco comment: Socially   Substance Use Topics  . Alcohol use: Yes    Comment: occasionally  . Drug use: No    Home Medications Prior to Admission medications   Medication Sig Start Date End Date Taking? Authorizing Provider  abatacept 750 mg in sodium chloride 0.9 % 70 mL Inject 750 mg into the vein every 28 (twenty-eight) days. 09/21/18  Yes Deveshwar, Abel Presto, MD  acetaminophen (TYLENOL) 500 MG tablet Take 1,000 mg by mouth every 6 (six) hours as needed for mild pain.   Yes [provider]  allopurinol (ZYLOPRIM) 300 MG tablet Take 1  tablet (300 mg total) by mouth daily. 03/31/17 05/06/19 Yes Deveshwar, Abel Presto, MD  aspirin EC 81 MG EC tablet Take 1 tablet (81 mg total) by mouth daily. 10/22/15  Yes Dessa Phi, DO  Aspirin-Caffeine (BACK & BODY EXTRA STRENGTH) 500-32.5 MG TABS Take 1 tablet by mouth daily as needed (pain).   Yes [provider]  atorvastatin (LIPITOR) 80 MG tablet Take 80 mg by mouth daily.  06/22/16  Yes [provider]  Calcium Carb-Cholecalciferol (CALCIUM 600-D PO) Take 1 tablet by mouth daily.   Yes [provider]  Cholecalciferol (VITAMIN D3) 10 MCG (400 UNIT) tablet Take 400 Units by mouth daily.   Yes [provider]  clotrimazole-betamethasone (LOTRISONE) cream Apply 1 application topically daily as needed (  rash/irritation).  05/29/16  Yes [provider]  cyclobenzaprine (FLEXERIL) 10 MG tablet Take 10 mg by mouth 3 (three) times daily as needed for muscle spasms.    Yes [provider]  diclofenac Sodium (VOLTAREN) 1 % GEL Apply 1 application topically 2 (two) times daily as needed (leg pain).   Yes [provider]  ferrous sulfate (FER-IN-SOL) 75 (15 Fe) MG/ML SOLN Take 45 mg of iron by mouth daily.   Yes [provider]  HYDROcodone-acetaminophen (NORCO) 5-325 MG tablet Take 1 tablet by mouth every 6 (six) hours as needed for moderate pain. Patient taking differently: Take 1-2 tablets by mouth every 6 (six) hours as needed for moderate pain.  07/22/16  Yes Stover, Titorya, DPM  Insulin Isophane & Regular Human (NOVOLIN 70/30 FLEXPEN RELION) (70-30) 100 UNIT/ML PEN Inject 6-8 Units into the skin 3 (three) times daily as needed (CBG >140).   Yes [provider]  leflunomide (ARAVA) 10 MG tablet Take 1 tablet (10 mg total) by mouth daily. 12/16/18  Yes Deveshwar, Abel Presto, MD  levothyroxine (SYNTHROID, LEVOTHROID) 200 MCG tablet Take 1 tablet (200 mcg total) by mouth daily at 6 (six) AM. Patient taking differently: Take 200 mcg  by mouth daily before breakfast.  02/13/18  Yes Mariel Aloe, MD  losartan (COZAAR) 50 MG tablet Take 50 mg by mouth daily. 08/03/18  Yes [provider]  pregabalin (LYRICA) 75 MG capsule 1 bid Patient taking differently: Take 75 mg by mouth 2 (two) times daily.  09/30/16  Yes Deveshwar, Abel Presto, MD  vitamin B-12 (CYANOCOBALAMIN) 50 MCG tablet Take 50 mcg by mouth daily.   Yes [provider]  BD INSULIN SYRINGE ULTRAFINE 31G X 15/64" 0.3 ML MISC  11/02/15   [provider]    Allergies    Methotrexate derivatives, Actemra [tocilizumab], Other, Penicillins, Remicade [infliximab], and Zocor [simvastatin]  Review of Systems   Review of Systems  Unable to perform ROS: Dementia (level 5 caveat)    Physical Exam Updated Vital Signs BP 138/67 (BP Location: Right Arm)   Pulse 63   Temp 99.1 F (37.3 C) (Oral)   Resp 18   Ht 5\' 4"  (1.626 m)   Wt 81.6 kg   LMP  (LMP Unknown)   SpO2 98%   BMI 30.90 kg/m   Physical Exam Vitals and nursing note reviewed.  Constitutional:      General: She is not in acute distress.    Appearance: She is well-developed.     Comments: Lethargic, does awaken and mumbles  HENT:     Head: Normocephalic and atraumatic.  Eyes:     Conjunctiva/sclera: Conjunctivae normal.  Cardiovascular:     Rate and Rhythm: Normal rate and regular rhythm.     Pulses: Normal pulses.  Pulmonary:     Effort: Pulmonary effort is normal. No respiratory distress.  Abdominal:     Palpations: Abdomen is soft.     Tenderness: There is no abdominal tenderness. There is no guarding.  Musculoskeletal:     Cervical back: Neck supple.  Skin:    General: Skin is warm and dry.  Neurological:     GCS: GCS eye subscore is 3. GCS verbal subscore is 4. GCS motor subscore is 6.     Cranial Nerves: No facial asymmetry.     ED Results / Procedures / Treatments   Labs (all labs ordered are listed, but only abnormal results are displayed) Labs Reviewed    URINALYSIS, [place]  MICROSCOPIC - Abnormal; Notable for the following components:      Result Value   Color, Urine AMBER (*)    APPearance CLOUDY (*)    Hgb urine dipstick SMALL (*)    Protein, ur >=300 (*)    Bacteria, UA RARE (*)    All other components within normal limits  CBC WITH DIFFERENTIAL/PLATELET - Abnormal; Notable for the following components:   WBC 11.5 (*)    RBC 2.38 (*)    Hemoglobin 7.0 (*)    HCT 21.0 (*)    RDW 15.8 (*)    Neutro Abs 7.8 (*)    All other components within normal limits  CK - Abnormal; Notable for the following components:   Total CK 258 (*)    All other components within normal limits  COMPREHENSIVE METABOLIC PANEL - Abnormal; Notable for the following components:   Potassium 5.2 (*)    Chloride 112 (*)    CO2 15 (*)    BUN 57 (*)    Creatinine, Ser 4.14 (*)    Calcium 7.5 (*)    Total Protein 5.6 (*)    Albumin 2.1 (*)    GFR calc non Af Amer 10 (*)    GFR calc Af Amer 11 (*)    Anion gap 16 (*)    All other components within normal limits  C-REACTIVE PROTEIN - Abnormal; Notable for the following components:   CRP 12.8 (*)    All other components within normal limits  COMPREHENSIVE METABOLIC PANEL - Abnormal; Notable for the following components:   Chloride 113 (*)    CO2 20 (*)    BUN 57 (*)    Creatinine, Ser 4.02 (*)    Calcium 7.5 (*)    Total Protein 6.0 (*)    Albumin 2.1 (*)    GFR calc non Af Amer 10 (*)    GFR calc Af Amer 11 (*)    All other components within normal limits  CBC - Abnormal; Notable for the following components:   RBC 2.96 (*)    Hemoglobin 8.5 (*)    HCT 26.9 (*)    RDW 15.9 (*)    All other components within normal limits  D-DIMER, QUANTITATIVE (NOT AT [facility]) - Abnormal; Notable for the following components:   D-Dimer, Quant 17.07 (*)    All other components within normal limits  FERRITIN - Abnormal; Notable for the following components:   Ferritin 612 (*)    All other components within  normal limits  IRON AND TIBC - Abnormal; Notable for the following components:   Iron 18 (*)    TIBC 140 (*)    All other components within normal limits  TSH - Abnormal; Notable for the following components:   TSH 0.010 (*)    All other components within normal limits  RETICULOCYTES - Abnormal; Notable for the following components:   RBC. 2.96 (*)    Retic Count, Absolute 14.2 (*)    All other components within normal limits  POC SARS CORONAVIRUS 2 AG -  ED - Abnormal; Notable for the following components:   SARS Coronavirus 2 Ag POSITIVE (*)    All other components within normal limits  I-STAT CREATININE, ED - Abnormal; Notable for the following components:   Creatinine, Ser 4.20 (*)    All other components within normal limits  POCT I-STAT EG7 - Abnormal; Notable for the following components:   pCO2, Ven 27.3 (*)    pO2, Ven 91.0 (*)  Bicarbonate 16.9 (*)    TCO2 18 (*)    Acid-base deficit 7.0 (*)    Calcium, Ion 0.99 (*)    HCT 24.0 (*)    Hemoglobin 8.2 (*)    All other components within normal limits  TROPONIN I (HIGH SENSITIVITY) - Abnormal; Notable for the following components:   Troponin I (High Sensitivity) 55 (*)    All other components within normal limits  CULTURE, BLOOD (ROUTINE X 2)  CULTURE, BLOOD (ROUTINE X 2)  URINE CULTURE  LACTIC ACID, PLASMA  BRAIN NATRIURETIC PEPTIDE  LIPASE, BLOOD  PROTIME-INR  APTT  PROCALCITONIN  VITAMIN B12  CBC WITH DIFFERENTIAL/PLATELET  FOLATE  HEMOGLOBIN A1C  LIPID PANEL  MAGNESIUM  PHOSPHORUS  POC OCCULT BLOOD, ED  TYPE AND SCREEN    EKG EKG Interpretation  Date/Time:  Monday February 14 2019 18:20:59 EST Ventricular Rate:  74 PR Interval:    QRS Duration: 127 QT Interval:  412 QTC Calculation: 458 R Axis:   37 Text Interpretation: Sinus rhythm Left bundle branch block No STEMI Confirmed by Octaviano Glow [phone]) on 02/14/2019 6:27:19 PM   Radiology DG Chest Port 1 View  Result Date:  02/14/2019 CLINICAL DATA:  Sepsis EXAM: PORTABLE CHEST 1 VIEW COMPARISON:  02/10/2018 FINDINGS: Heart and mediastinal contours are within normal limits. No focal opacities or effusions. No acute bony abnormality. IMPRESSION: No active disease. Electronically Signed   By: Rolm Baptise M.D.   On: 02/14/2019 18:29    Procedures .Critical Care Performed by: Wyvonnia Dusky, MD Authorized by: Wyvonnia Dusky, MD   Critical care provider statement:    Critical care time (minutes):  48   Critical care was necessary to treat or prevent imminent or life-threatening deterioration of the following conditions:  Sepsis   Critical care was time spent personally by me on the following activities:  Discussions with consultants, evaluation of patient's response to treatment, examination of patient, ordering and performing treatments and interventions, ordering and review of laboratory studies, ordering and review of radiographic studies, pulse oximetry, re-evaluation of patient's condition, obtaining history from patient or surrogate and review of old charts Comments:     IV fluids, IV antibiotics   (including critical care time)  Medications Ordered in ED Medications  sodium bicarbonate 150 mEq in sterile water 1,000 mL infusion ( Intravenous New Bag/Given 02/15/19 0353)  aspirin EC tablet 81 mg (has no administration in time range)  atorvastatin (LIPITOR) tablet 80 mg (has no administration in time range)  insulin aspart (novoLOG) injection 0-6 Units (has no administration in time range)  sodium chloride flush (NS) 0.9 % injection 3 mL (3 mLs Intravenous Given 02/15/19 1126)  acetaminophen (TYLENOL) tablet 650 mg (has no administration in time range)  ondansetron (ZOFRAN) tablet 4 mg (has no administration in time range)    Or  ondansetron (ZOFRAN) injection 4 mg (has no administration in time range)  Darbepoetin Alfa (ARANESP) injection 40 mcg (0 mcg Subcutaneous Hold 02/15/19 0500)  metroNIDAZOLE  (FLAGYL) IVPB 500 mg (has no administration in time range)  levothyroxine (SYNTHROID) tablet 200 mcg (has no administration in time range)  cefTRIAXone (ROCEPHIN) 1 g in sodium chloride 0.9 % 100 mL IVPB (has no administration in time range)  dexamethasone (DECADRON) injection 6 mg (has no administration in time range)  famotidine (PEPCID) tablet 20 mg (has no administration in time range)  heparin injection 7,500 Units (has no administration in time range)  remdesivir 200 mg in sodium chloride 0.9% 250 mL  IVPB (has no administration in time range)    Followed by  remdesivir 100 mg in sodium chloride 0.9 % 100 mL IVPB (has no administration in time range)  sodium chloride 0.9 % bolus 1,000 mL (0 mLs Intravenous Stopped 02/15/19 0019)    And  sodium chloride 0.9 % bolus 1,000 mL (0 mLs Intravenous Stopped 02/15/19 0215)  vancomycin (VANCOCIN) IVPB 1000 mg/200 mL premix (0 mg Intravenous Stopped 02/15/19 0334)    ED Course  I have reviewed the triage vital signs and the nursing notes.  Pertinent labs & imaging results that were available during my care of the patient were reviewed by me and considered in my medical decision making (see chart for details).  79 yo female presenting with weakness, poor appetite, diarrhea x 5 days.  Multiple family members at home were recently positive with COVID-19.  Patient febrile on arrival.  Sepsis protocol initiated - she was given IV antibiotics and IV fluids.  Subsequently her COVID test resulted positive, with with no significant leukocytosis or elevated lactate, I suspect her illness is likely driven by COVID virus, not bacterial infection.  Her labs were also notable for an AKI and electrolyte derangements, mild hyperK (5.2) and hypocalcemia, and low TSH.  Her mental status did improve after IV fluids.  Her family was informed that she would be admitted to the hospital (I spoke to her daughter by phone when she arrived).  [name] was evaluated in  Emergency Department on 02/15/2019 for the symptoms described in the history of present illness. She was evaluated in the context of the global COVID-19 pandemic, which necessitated consideration that the patient might be at risk for infection with the SARS-CoV-2 virus that causes COVID-19. Institutional protocols and algorithms that pertain to the evaluation of patients at risk for COVID-19 are in a state of rapid change based on information released by regulatory bodies including the CDC and federal and state organizations. These policies and algorithms were followed during the patient's care in the ED.   Final Clinical Impression(s) / ED Diagnoses Final diagnoses:  COVID-19  Altered mental status, unspecified altered mental status type  AKI (acute kidney injury) ([place])  Hypocalcemia  Hyperkalemia    Rx / DC Orders ED Discharge Orders    None       Wyvonnia Dusky, MD 02/15/19 1133

## 2019-02-14 NOTE — Progress Notes (Signed)
Spoke with RN about labs being drawn.  Rn informed me that IV team was finally able to gain IV access and labs were drawn.  Awaiting results.

## 2019-02-14 NOTE — H&P (Addendum)
History and Physical    TATANISHA CUTHBERT DJT:[id number] DOB: 1940/08/03 DOA: 02/14/2019  PCP: Jonathon Jordan, MD   Patient coming from: Home   Chief Complaint: Lethargy, LLQ abdominal pain, N/V/D   HPI: [name] is a 79 y.o. female with medical history significant for hypothyroidism, chronic kidney disease stage III, rheumatoid arthritis, insulin-dependent diabetes mellitus, chronic anemia, chronic pain, and aortic stenosis, now presenting to the emergency department for evaluation of lethargy, nausea, vomiting, diarrhea, left lower quadrant abdominal pain, and loss of appetite.  Family called EMS due to patient being essentially bedbound and increasingly lethargic for the past few days.  Household contacts have had COVID-19 recently.  Patient is reportedly alert and fully oriented, and ambulates independently at her baseline, but family has been concerned that she has been too weak to get out of bed and today seemed to be too weak to even speak.  Patient became more alert in the emergency department and was able to provide further history, noting left lower quadrant pain that has been waxing and waning, nausea, nonbloody vomiting, and nonbloody diarrhea.  She has had chills, has had a nonproductive cough, but denies any shortness of breath.  She denies chest pain.  She reports that her legs sometimes swell but not so much recently.  She denies any headache, change in vision or hearing, or focal numbness or weakness.  ED Course: Upon arrival to the ED, patient is found to be febrile to 39.4 C, saturating mid to upper 90s on room air, and with normal heart rate, normal respirations, and stable blood pressure.  EKG features a sinus rhythm with new LBBB.  Chest x-ray is negative for acute cardiopulmonary disease.  CBC is notable for leukocytosis 11,500 and a normocytic anemia with hemoglobin 7.0, down from 10.1 in November 2020.  Covid antigen was positive.  Fecal occult blood testing  negative.  Lactic acid reassuringly normal at 1.0.  Troponin and CMP were ordered from the emergency department but not yet resulted.  Blood cultures were collected and the patient was treated with 2 L of IV fluids, Rocephin, Flagyl, and vancomycin.  Review of Systems:  All other systems reviewed and apart from HPI, are negative.  Past Medical History:  Diagnosis Date  . Allergic rhinitis   . Anemia   . Aortic stenosis 12/22/2012   Mild 3/14-2.35 m/s peak velocity  . Cataract 2018  . CHF (congestive heart failure) ([place])   . Chronic pain   . CKD (chronic kidney disease), stage III   . Diabetes mellitus   . Diverticulosis   . DVT (deep venous thrombosis) ([place])   . Glaucoma   . Hemorrhoids    internal  . HTN (hypertension)   . Hypercholesteremia   . Hyperlipidemia 12/22/2012  . Hypothyroidism   . Meningioma ([place]) 2008  . Osteoarthritis   . Peripheral neuropathy   . PPD positive    6 months ago  . PVC (premature ventricular contraction) 12/22/2012  . Rheumatoid arthritis(714.0)   . Sickle cell trait ([place])   . 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 ABDOMINAL HYSTERECTOMY  1978   TAH,& BSO 1  . CATARACT EXTRACTION Bilateral   . EYE SURGERY    . fistula repair  03/2010   partial colectomy  . intracranial surgery  11/2007   removal of meningioma  . JOINT REPLACEMENT  2001   left TKR  . LEFT HEART CATHETERIZATION WITH CORONARY ANGIOGRAM N/A 08/08/2013   Procedure: LEFT HEART CATHETERIZATION WITH CORONARY ANGIOGRAM;  Surgeon: Peter M Martinique, MD;  Location: [facility] CATH LAB;  Service: Cardiovascular;  Laterality: N/A;  . ROTATOR CUFF REPAIR  1999   BIlateral  . SIGMOIDECTOMY  2012  . TOE SURGERY Left 2018  . TONSILLECTOMY AND ADENOIDECTOMY    . TOTAL THYROIDECTOMY  1992  . wrist sx     right      reports that she quit smoking about 57 years ago. Her smoking use  included cigarettes. She has a 0.20 pack-year smoking history. She has never used smokeless tobacco. She reports current alcohol use. She reports that she does not use drugs.  Allergies  Allergen Reactions  . Methotrexate Derivatives Other (See Comments)    Elevated creat. Levels  . Actemra [Tocilizumab] Rash  . Other Other (See Comments)    NO Blood products.  [name]- causes low blood sugar (pt states she was taking medication but cant remember what it was for.)  . Penicillins Rash    Has patient had a PCN reaction causing immediate rash, facial/tongue/throat swelling, SOB or lightheadedness with hypotension: Yes Has patient had a PCN reaction causing severe rash involving mucus membranes or skin necrosis: Yes  Has patient had a PCN reaction that required hospitalization No Has patient had a PCN reaction occurring within the last 10 years: No If all of the above answers are "NO", then may proceed with Cephalosporin use.   . Remicade [Infliximab] Other (See Comments)    Inadequate response   . Zocor [Simvastatin] Other (See Comments)    Leg cramps     Family History  Problem Relation Age of Onset  . Hypotension Mother   . CVA Mother   . Diabetes Mother   . Hypertension Father   . Kidney failure Father      Prior to Admission medications   Medication Sig Start Date End Date Taking? Authorizing Provider  abatacept 750 mg in sodium chloride 0.9 % 70 mL Inject 750 mg into the vein every 28 (twenty-eight) days. 09/21/18  Yes Deveshwar, Abel Presto, MD  acetaminophen (TYLENOL) 500 MG tablet Take 1,000 mg by mouth every 6 (six) hours as needed for mild pain.   Yes [provider]  allopurinol (ZYLOPRIM) 300 MG tablet Take 1 tablet (300 mg total) by mouth daily. 03/31/17 05/06/19 Yes Deveshwar, Abel Presto, MD  aspirin EC 81 MG EC tablet Take 1 tablet (81 mg total) by mouth daily. 10/22/15  Yes Dessa Phi, DO  Aspirin-Caffeine (BACK & BODY EXTRA STRENGTH) 500-32.5 MG TABS Take  1 tablet by mouth daily as needed (pain).   Yes [provider]  atorvastatin (LIPITOR) 80 MG tablet Take 80 mg by mouth daily.  06/22/16  Yes [provider]  Calcium Carb-Cholecalciferol (CALCIUM 600-D PO) Take 1 tablet by mouth daily.   Yes [provider]  Cholecalciferol (VITAMIN D3) 10 MCG (400 UNIT) tablet Take 400 Units by mouth daily.   Yes [provider]  clotrimazole-betamethasone (LOTRISONE) cream Apply 1 application topically daily as needed (rash/irritation).  05/29/16  Yes [provider]  cyclobenzaprine (FLEXERIL) 10 MG tablet Take 10 mg by mouth 3 (three) times daily as needed for muscle spasms.    Yes [provider]  diclofenac Sodium (VOLTAREN) 1 % GEL Apply 1 application topically 2 (two) times daily as needed (leg pain).   Yes [provider]  ferrous sulfate (FER-IN-SOL)  75 (15 Fe) MG/ML SOLN Take 45 mg of iron by mouth daily.   Yes [provider]  HYDROcodone-acetaminophen (NORCO) 5-325 MG tablet Take 1 tablet by mouth every 6 (six) hours as needed for moderate pain. Patient taking differently: Take 1-2 tablets by mouth every 6 (six) hours as needed for moderate pain.  07/22/16  Yes Stover, Titorya, DPM  Insulin Isophane & Regular Human (NOVOLIN 70/30 FLEXPEN RELION) (70-30) 100 UNIT/ML PEN Inject 6-8 Units into the skin 3 (three) times daily as needed (CBG >140).   Yes [provider]  leflunomide (ARAVA) 10 MG tablet Take 1 tablet (10 mg total) by mouth daily. 12/16/18  Yes Deveshwar, Abel Presto, MD  levothyroxine (SYNTHROID, LEVOTHROID) 200 MCG tablet Take 1 tablet (200 mcg total) by mouth daily at 6 (six) AM. Patient taking differently: Take 200 mcg by mouth daily before breakfast.  02/13/18  Yes Mariel Aloe, MD  losartan (COZAAR) 50 MG tablet Take 50 mg by mouth daily. 08/03/18  Yes [provider]  pregabalin (LYRICA) 75 MG capsule 1 bid Patient taking differently: Take 75 mg by mouth  2 (two) times daily.  09/30/16  Yes Deveshwar, Abel Presto, MD  vitamin B-12 (CYANOCOBALAMIN) 50 MCG tablet Take 50 mcg by mouth daily.   Yes [provider]  BD INSULIN SYRINGE ULTRAFINE 31G X 15/64" 0.3 ML MISC  11/02/15   [provider]    Physical Exam: Vitals:   02/14/19 2307 02/14/19 2323 02/14/19 2349 02/14/19 2350  BP:  (!) 109/49    Pulse:  69    Resp:  18    Temp:    99.9 F (37.7 C)  TempSrc:    Oral  SpO2:  99%    Weight: 81.6 kg  81.6 kg   Height: 5\' 4"  (1.626 m)  5\' 4"  (1.626 m)      Constitutional: No respiratory distress, no diaphoresis, lethargic   Eyes: PERTLA, lids and conjunctivae normal ENMT: Mucous membranes are moist. Posterior pharynx clear of any exudate or lesions.   Neck: normal, supple, no masses, no thyromegaly Respiratory: no wheezing, no crackles. No accessory muscle use.  Cardiovascular: S1 & S2 heard, regular rate and rhythm. Trace lower leg edema bilaterally. Abdomen: No distension, soft, tender in LLQ without rebound pain or guarding. Bowel sounds active.  Musculoskeletal: no clubbing / cyanosis. No joint deformity upper and lower extremities.   Skin: no significant rashes, lesions, ulcers. Warm, dry, well-perfused. Neurologic: CN 2-12 grossly intact. Sensation to light touch intact. Strength 5/5 in all 4 limbs.  Psychiatric: Sleeping, wakes to voice. Oriented to person, place, and situation.    Labs and Imaging on Admission: I have personally reviewed following labs and imaging studies  CBC: Recent Labs  Lab 02/14/19 2005  WBC 11.5*  NEUTROABS 7.8*  HGB 7.0*  HCT 21.0*  MCV 88.2  PLT 601   Basic Metabolic Panel: No results for input(s): NA, K, CL, CO2, GLUCOSE, BUN, CREATININE, CALCIUM, MG, PHOS in the last 168 hours. GFR: CrCl cannot be calculated (Patient's most recent lab result is older than the maximum 21 days allowed.). Liver Function Tests: No results for input(s): AST, ALT, ALKPHOS, BILITOT, PROT, ALBUMIN in  the last 168 hours. No results for input(s): LIPASE, AMYLASE in the last 168 hours. No results for input(s): AMMONIA in the last 168 hours. Coagulation Profile: No results for input(s): INR, PROTIME in the last 168 hours. Cardiac Enzymes: No results for input(s): CKTOTAL, CKMB, CKMBINDEX, TROPONINI in the last 168  hours. BNP (last 3 results) No results for input(s): PROBNP in the last 8760 hours. HbA1C: No results for input(s): HGBA1C in the last 72 hours. CBG: No results for input(s): GLUCAP in the last 168 hours. Lipid Profile: No results for input(s): CHOL, HDL, LDLCALC, TRIG, CHOLHDL, LDLDIRECT in the last 72 hours. Thyroid Function Tests: No results for input(s): TSH, T4TOTAL, FREET4, T3FREE, THYROIDAB in the last 72 hours. Anemia Panel: No results for input(s): VITAMINB12, FOLATE, FERRITIN, TIBC, IRON, RETICCTPCT in the last 72 hours. Urine analysis:    Component Value Date/Time   COLORURINE AMBER (A) 02/14/2019 1945   APPEARANCEUR CLOUDY (A) 02/14/2019 1945   LABSPEC 1.014 02/14/2019 1945   LABSPEC 1.030 01/10/2010 1424   PHURINE 5.0 02/14/2019 1945   GLUCOSEU NEGATIVE 02/14/2019 1945   HGBUR SMALL (A) 02/14/2019 1945   BILIRUBINUR NEGATIVE 02/14/2019 1945   BILIRUBINUR not done 01/10/2010 1424   [place] 02/14/2019 1945   PROTEINUR >=300 (A) 02/14/2019 1945   UROBILINOGEN 0.2 09/20/2014 1643   NITRITE NEGATIVE 02/14/2019 1945   LEUKOCYTESUR NEGATIVE 02/14/2019 1945   LEUKOCYTESUR Large 01/10/2010 1424   Sepsis Labs: @LABRCNTIP (procalcitonin:4,lacticidven:4) )No results found for this or any previous visit (from the past 240 hour(s)).   Radiological Exams on Admission: DG Chest Port 1 View  Result Date: 02/14/2019 CLINICAL DATA:  Sepsis EXAM: PORTABLE CHEST 1 VIEW COMPARISON:  02/10/2018 FINDINGS: Heart and mediastinal contours are within normal limits. No focal opacities or effusions. No acute bony abnormality. IMPRESSION: No active disease.  Electronically Signed   By: Rolm Baptise M.D.   On: 02/14/2019 18:29    EKG: Independently reviewed. Sinus rhythm, LBBB.   Assessment/Plan   1. Acute kidney injury superimposed on CKD IIIb; metabolic acidosis  - SCr is 4.14 on admission, up from 1.68 in November  - Bicarbonate is 15 and potassium 5.2 on admission  - Likely a prerenal azotemia in setting of N/V/D and loss of appetite  - She was given 2 liters NS in ED  - Continue IVF hydration with isotonic bicarbonate, renally-dose medications, avoid nephrotoxins, repeat chem panel in am   2. LLQ abdominal pain  - Presents with fever, LLQ pain, and diarrhea  - Fever could be secondary to COVID-19 but patient reports hx of diverticulitis and was treated with Rocephin and Flagyl in ED  - CT considered but GFR low and there is no peritoneal signs or lactate elevation so will treat empirically for now with cefepime and Flagyl    3. COVID-19 infection  - Presents with fever and known COVID exposure, found to have positive COVID Ag, denies SOB and has clear CXR, no tachypnea or hypoxia  - Hold targeted treatment for now given lack of respiratory s/s, monitor    4. Normocytic anemia  - Hgb is 7.0 in ED, down from 10.1 in November 2020  - Patient denies melena or hematochezia and FOBT is negative   ADDENDUM:  - Patient refuses blood transfusions, family aware of this decision and supportive of her; family and patient aware that this is life-threatening situation that can be corrected with RBC transfusion  - Minimize blood draws, will try to add anemia panel to prior draw, give B12 and iron if low, give EPO now per pharmacy      5. Lethargy, generalized weakness   - Presents with several days of lethargy, improved significantly in ED with IVF and no focal neurologic deficits identified  - Likely secondary to the acute febrile illness and dehydration  -  Continue supportive care, hold sedating medications    6. Insulin-dependent DM   - No  recent A1c available  - Continue CBG checks and insulin    7. LBBB, elevated troponin - LBBB appears to be new  - Patient denies chest pain or SOB but HS troponin sent from ED and elevated at 55   - Continue cardiac monitoring, check delta troponin     8. Hypothyroidism  - Check TSH given marked fatigue and worsening in chronic anemia with negative FOBT - Continue Synthroid   9. Rheumatoid arthritis; chronic pain  - Sedating medications held on admission     DVT prophylaxis: SCD's Code Status: DNR, confirmed with patient. Patient's daughter aware, reports they have had this discussion previously.  Family Communication: Discussed with patient's daughter, [name] by phone  Disposition Plan: From home, is critically-ill, and may need acute rehab when renal function and anemia stable enough for hospital discharge. She also has COVID with no respiratory s/s on admission but could certainly develop respiratory complications.  Consults called: None   Admission status: Inpatient. Patient has advanced comorbidity, now with critically-ill with acute renal failure and severe anemia, and is not reasonably expected to be stable for discharge within observation timeframe    Vianne Bulls, MD [facility] Pager: See [email or web address]  If 7AM-7PM, please contact the daytime attending [email or web address]  02/14/2019, 11:58 PM

## 2019-02-15 ENCOUNTER — Encounter (HOSPITAL_COMMUNITY): Payer: Self-pay | Admitting: Family Medicine

## 2019-02-15 ENCOUNTER — Other Ambulatory Visit: Payer: Self-pay | Admitting: *Deleted

## 2019-02-15 DIAGNOSIS — D573 Sickle-cell trait: Secondary | ICD-10-CM | POA: Diagnosis present

## 2019-02-15 DIAGNOSIS — Z79899 Other long term (current) drug therapy: Secondary | ICD-10-CM

## 2019-02-15 DIAGNOSIS — G8929 Other chronic pain: Secondary | ICD-10-CM | POA: Diagnosis present

## 2019-02-15 DIAGNOSIS — E890 Postprocedural hypothyroidism: Secondary | ICD-10-CM | POA: Diagnosis present

## 2019-02-15 DIAGNOSIS — R809 Proteinuria, unspecified: Secondary | ICD-10-CM

## 2019-02-15 DIAGNOSIS — G9341 Metabolic encephalopathy: Secondary | ICD-10-CM

## 2019-02-15 DIAGNOSIS — U071 COVID-19: Secondary | ICD-10-CM | POA: Diagnosis not present

## 2019-02-15 DIAGNOSIS — N170 Acute kidney failure with tubular necrosis: Secondary | ICD-10-CM | POA: Diagnosis present

## 2019-02-15 DIAGNOSIS — Z7401 Bed confinement status: Secondary | ICD-10-CM

## 2019-02-15 DIAGNOSIS — E1129 Type 2 diabetes mellitus with other diabetic kidney complication: Secondary | ICD-10-CM

## 2019-02-15 DIAGNOSIS — R197 Diarrhea, unspecified: Secondary | ICD-10-CM | POA: Diagnosis present

## 2019-02-15 DIAGNOSIS — I350 Nonrheumatic aortic (valve) stenosis: Secondary | ICD-10-CM

## 2019-02-15 DIAGNOSIS — N179 Acute kidney failure, unspecified: Secondary | ICD-10-CM | POA: Diagnosis present

## 2019-02-15 DIAGNOSIS — R791 Abnormal coagulation profile: Secondary | ICD-10-CM | POA: Diagnosis not present

## 2019-02-15 DIAGNOSIS — N184 Chronic kidney disease, stage 4 (severe): Secondary | ICD-10-CM | POA: Diagnosis present

## 2019-02-15 DIAGNOSIS — J1282 Pneumonia due to coronavirus disease 2019: Secondary | ICD-10-CM | POA: Diagnosis present

## 2019-02-15 DIAGNOSIS — G894 Chronic pain syndrome: Secondary | ICD-10-CM | POA: Diagnosis not present

## 2019-02-15 DIAGNOSIS — E1142 Type 2 diabetes mellitus with diabetic polyneuropathy: Secondary | ICD-10-CM | POA: Diagnosis present

## 2019-02-15 DIAGNOSIS — R778 Other specified abnormalities of plasma proteins: Secondary | ICD-10-CM | POA: Diagnosis not present

## 2019-02-15 DIAGNOSIS — R4182 Altered mental status, unspecified: Secondary | ICD-10-CM

## 2019-02-15 DIAGNOSIS — I447 Left bundle-branch block, unspecified: Secondary | ICD-10-CM | POA: Diagnosis present

## 2019-02-15 DIAGNOSIS — I10 Essential (primary) hypertension: Secondary | ICD-10-CM | POA: Diagnosis not present

## 2019-02-15 DIAGNOSIS — Z833 Family history of diabetes mellitus: Secondary | ICD-10-CM | POA: Diagnosis not present

## 2019-02-15 DIAGNOSIS — E1122 Type 2 diabetes mellitus with diabetic chronic kidney disease: Secondary | ICD-10-CM

## 2019-02-15 DIAGNOSIS — IMO0001 Reserved for inherently not codable concepts without codable children: Secondary | ICD-10-CM | POA: Insufficient documentation

## 2019-02-15 DIAGNOSIS — I82409 Acute embolism and thrombosis of unspecified deep veins of unspecified lower extremity: Secondary | ICD-10-CM | POA: Diagnosis not present

## 2019-02-15 DIAGNOSIS — I129 Hypertensive chronic kidney disease with stage 1 through stage 4 chronic kidney disease, or unspecified chronic kidney disease: Secondary | ICD-10-CM

## 2019-02-15 DIAGNOSIS — E119 Type 2 diabetes mellitus without complications: Secondary | ICD-10-CM | POA: Diagnosis not present

## 2019-02-15 DIAGNOSIS — Z66 Do not resuscitate: Secondary | ICD-10-CM | POA: Diagnosis present

## 2019-02-15 DIAGNOSIS — M1711 Unilateral primary osteoarthritis, right knee: Secondary | ICD-10-CM

## 2019-02-15 DIAGNOSIS — D631 Anemia in chronic kidney disease: Secondary | ICD-10-CM | POA: Diagnosis not present

## 2019-02-15 DIAGNOSIS — E669 Obesity, unspecified: Secondary | ICD-10-CM

## 2019-02-15 DIAGNOSIS — E785 Hyperlipidemia, unspecified: Secondary | ICD-10-CM | POA: Diagnosis present

## 2019-02-15 DIAGNOSIS — H409 Unspecified glaucoma: Secondary | ICD-10-CM | POA: Diagnosis present

## 2019-02-15 DIAGNOSIS — D649 Anemia, unspecified: Secondary | ICD-10-CM

## 2019-02-15 DIAGNOSIS — Z96652 Presence of left artificial knee joint: Secondary | ICD-10-CM | POA: Diagnosis present

## 2019-02-15 DIAGNOSIS — J1289 Other viral pneumonia: Secondary | ICD-10-CM | POA: Diagnosis not present

## 2019-02-15 DIAGNOSIS — N183 Chronic kidney disease, stage 3 unspecified: Secondary | ICD-10-CM | POA: Diagnosis not present

## 2019-02-15 DIAGNOSIS — Z794 Long term (current) use of insulin: Secondary | ICD-10-CM | POA: Diagnosis not present

## 2019-02-15 DIAGNOSIS — E7800 Pure hypercholesterolemia, unspecified: Secondary | ICD-10-CM | POA: Diagnosis present

## 2019-02-15 DIAGNOSIS — Z823 Family history of stroke: Secondary | ICD-10-CM | POA: Diagnosis not present

## 2019-02-15 DIAGNOSIS — R7989 Other specified abnormal findings of blood chemistry: Secondary | ICD-10-CM | POA: Diagnosis present

## 2019-02-15 DIAGNOSIS — Z531 Procedure and treatment not carried out because of patient's decision for reasons of belief and group pressure: Secondary | ICD-10-CM | POA: Insufficient documentation

## 2019-02-15 DIAGNOSIS — M069 Rheumatoid arthritis, unspecified: Secondary | ICD-10-CM

## 2019-02-15 DIAGNOSIS — Z6830 Body mass index (BMI) 30.0-30.9, adult: Secondary | ICD-10-CM

## 2019-02-15 DIAGNOSIS — I130 Hypertensive heart and chronic kidney disease with heart failure and stage 1 through stage 4 chronic kidney disease, or unspecified chronic kidney disease: Secondary | ICD-10-CM | POA: Diagnosis present

## 2019-02-15 DIAGNOSIS — E039 Hypothyroidism, unspecified: Secondary | ICD-10-CM

## 2019-02-15 DIAGNOSIS — Z8249 Family history of ischemic heart disease and other diseases of the circulatory system: Secondary | ICD-10-CM | POA: Diagnosis not present

## 2019-02-15 DIAGNOSIS — N1832 Chronic kidney disease, stage 3b: Secondary | ICD-10-CM | POA: Diagnosis not present

## 2019-02-15 LAB — COMPREHENSIVE METABOLIC PANEL
ALT: 24 U/L (ref 0–44)
ALT: 23 U/L (ref 0–44)
AST: 38 U/L (ref 15–41)
AST: 38 U/L (ref 15–41)
Albumin: 2.1 g/dL — ABNORMAL LOW (ref 3.5–5.0)
Albumin: 2.1 g/dL — ABNORMAL LOW (ref 3.5–5.0)
Alkaline Phosphatase: 65 U/L (ref 38–126)
Alkaline Phosphatase: 71 U/L (ref 38–126)
Anion gap: 16 — ABNORMAL HIGH (ref 5–15)
Anion gap: 9 (ref 5–15)
BUN: 57 mg/dL — ABNORMAL HIGH (ref 8–23)
BUN: 57 mg/dL — ABNORMAL HIGH (ref 8–23)
CO2: 15 mmol/L — ABNORMAL LOW (ref 22–32)
CO2: 20 mmol/L — ABNORMAL LOW (ref 22–32)
Calcium: 7.5 mg/dL — ABNORMAL LOW (ref 8.9–10.3)
Calcium: 7.5 mg/dL — ABNORMAL LOW (ref 8.9–10.3)
Chloride: 112 mmol/L — ABNORMAL HIGH (ref 98–111)
Chloride: 113 mmol/L — ABNORMAL HIGH (ref 98–111)
Creatinine, Ser: 4.14 mg/dL — ABNORMAL HIGH (ref 0.44–1.00)
Creatinine, Ser: 4.02 mg/dL — ABNORMAL HIGH (ref 0.44–1.00)
GFR calc Af Amer: 11 mL/min — ABNORMAL LOW (ref >60)
GFR calc Af Amer: 11 mL/min — ABNORMAL LOW (ref >60)
GFR calc non Af Amer: 10 mL/min — ABNORMAL LOW (ref >60)
GFR calc non Af Amer: 10 mL/min — ABNORMAL LOW (ref >60)
Glucose, Bld: 93 mg/dL (ref 70–99)
Glucose, Bld: 87 mg/dL (ref 70–99)
Potassium: 5.2 mmol/L — ABNORMAL HIGH (ref 3.5–5.1)
Potassium: 4.6 mmol/L (ref 3.5–5.1)
Sodium: 143 mmol/L (ref 135–145)
Sodium: 142 mmol/L (ref 135–145)
Total Bilirubin: 0.8 mg/dL (ref 0.3–1.2)
Total Bilirubin: 0.7 mg/dL (ref 0.3–1.2)
Total Protein: 5.6 g/dL — ABNORMAL LOW (ref 6.5–8.1)
Total Protein: 6 g/dL — ABNORMAL LOW (ref 6.5–8.1)

## 2019-02-15 LAB — APTT: aPTT: 29 seconds (ref 24–36)

## 2019-02-15 LAB — GLUCOSE, CAPILLARY
Glucose-Capillary: 76 mg/dL (ref 70–99)
Glucose-Capillary: 98 mg/dL (ref 70–99)
Glucose-Capillary: 152 mg/dL — ABNORMAL HIGH (ref 70–99)

## 2019-02-15 LAB — POCT I-STAT EG7
Acid-base deficit: 7 mmol/L — ABNORMAL HIGH (ref 0.0–2.0)
Bicarbonate: 16.9 mmol/L — ABNORMAL LOW (ref 20.0–28.0)
Calcium, Ion: 0.99 mmol/L — ABNORMAL LOW (ref 1.15–1.40)
HCT: 24 % — ABNORMAL LOW (ref 36.0–46.0)
Hemoglobin: 8.2 g/dL — ABNORMAL LOW (ref 12.0–15.0)
O2 Saturation: 97 %
Potassium: 4.4 mmol/L (ref 3.5–5.1)
Sodium: 144 mmol/L (ref 135–145)
TCO2: 18 mmol/L — ABNORMAL LOW (ref 22–32)
pCO2, Ven: 27.3 mmHg — ABNORMAL LOW (ref 44.0–60.0)
pH, Ven: 7.4 (ref 7.250–7.430)
pO2, Ven: 91 mmHg — ABNORMAL HIGH (ref 32.0–45.0)

## 2019-02-15 LAB — MAGNESIUM: Magnesium: 2 mg/dL (ref 1.7–2.4)

## 2019-02-15 LAB — CBC
HCT: 26.9 % — ABNORMAL LOW (ref 36.0–46.0)
Hemoglobin: 8.5 g/dL — ABNORMAL LOW (ref 12.0–15.0)
MCH: 28.7 pg (ref 26.0–34.0)
MCHC: 31.6 g/dL (ref 30.0–36.0)
MCV: 90.9 fL (ref 80.0–100.0)
Platelets: 173 10*3/uL (ref 150–400)
RBC: 2.96 MIL/uL — ABNORMAL LOW (ref 3.87–5.11)
RDW: 15.9 % — ABNORMAL HIGH (ref 11.5–15.5)
WBC: 8.8 10*3/uL (ref 4.0–10.5)
nRBC: 0 % (ref 0.0–0.2)

## 2019-02-15 LAB — PROTIME-INR
INR: 1.2 (ref 0.8–1.2)
Prothrombin Time: 14.7 seconds (ref 11.4–15.2)

## 2019-02-15 LAB — IRON AND TIBC
Iron: 18 ug/dL — ABNORMAL LOW (ref 28–170)
Saturation Ratios: 13 % (ref 10.4–31.8)
TIBC: 140 ug/dL — ABNORMAL LOW (ref 250–450)
UIBC: 122 ug/dL

## 2019-02-15 LAB — LIPID PANEL
Cholesterol: 142 mg/dL (ref 0–200)
HDL: 33 mg/dL — ABNORMAL LOW (ref >40)
LDL Cholesterol: 73 mg/dL (ref 0–99)
Total CHOL/HDL Ratio: 4.3 RATIO
Triglycerides: 182 mg/dL — ABNORMAL HIGH (ref <150)
VLDL: 36 mg/dL (ref 0–40)

## 2019-02-15 LAB — TSH: TSH: 0.01 u[IU]/mL — ABNORMAL LOW (ref 0.350–4.500)

## 2019-02-15 LAB — C-REACTIVE PROTEIN: CRP: 12.8 mg/dL — ABNORMAL HIGH (ref <1.0)

## 2019-02-15 LAB — FOLATE: Folate: 54.3 ng/mL (ref >5.9)

## 2019-02-15 LAB — CK: Total CK: 258 U/L — ABNORMAL HIGH (ref 38–234)

## 2019-02-15 LAB — FERRITIN: Ferritin: 612 ng/mL — ABNORMAL HIGH (ref 11–307)

## 2019-02-15 LAB — RETICULOCYTES
Immature Retic Fract: 8.9 % (ref 2.3–15.9)
RBC.: 2.96 MIL/uL — ABNORMAL LOW (ref 3.87–5.11)
Retic Count, Absolute: 14.2 10*3/uL — ABNORMAL LOW (ref 19.0–186.0)
Retic Ct Pct: 0.5 % (ref 0.4–3.1)

## 2019-02-15 LAB — I-STAT CREATININE, ED: Creatinine, Ser: 4.2 mg/dL — ABNORMAL HIGH (ref 0.44–1.00)

## 2019-02-15 LAB — PROCALCITONIN: Procalcitonin: 1.16 ng/mL

## 2019-02-15 LAB — PHOSPHORUS: Phosphorus: 4.1 mg/dL (ref 2.5–4.6)

## 2019-02-15 LAB — VITAMIN B12: Vitamin B-12: 378 pg/mL (ref 180–914)

## 2019-02-15 LAB — LIPASE, BLOOD: Lipase: 33 U/L (ref 11–51)

## 2019-02-15 LAB — TROPONIN I (HIGH SENSITIVITY): Troponin I (High Sensitivity): 55 ng/L — ABNORMAL HIGH (ref <18)

## 2019-02-15 LAB — D-DIMER, QUANTITATIVE: D-Dimer, Quant: 17.07 ug/mL-FEU — ABNORMAL HIGH (ref 0.00–0.50)

## 2019-02-15 MED ORDER — SODIUM CHLORIDE 0.9 % IV SOLN
510.0000 mg | INTRAVENOUS | Status: AC
  Administered 2019-02-15 – 2019-02-22 (×2): 510 mg via INTRAVENOUS
  Filled 2019-02-15 (×2): qty 17

## 2019-02-15 MED ORDER — HEPARIN SODIUM (PORCINE) 10000 UNIT/ML IJ SOLN
7500.0000 [IU] | Freq: Three times a day (TID) | INTRAMUSCULAR | Status: DC
  Administered 2019-02-15 – 2019-02-21 (×18): 7500 [IU] via SUBCUTANEOUS
  Filled 2019-02-15 (×18): qty 1

## 2019-02-15 MED ORDER — DARBEPOETIN ALFA 40 MCG/0.4ML IJ SOSY
40.0000 ug | PREFILLED_SYRINGE | INTRAMUSCULAR | Status: DC
  Administered 2019-02-16 – 2019-02-23 (×2): 40 ug via SUBCUTANEOUS
  Filled 2019-02-15 (×3): qty 0.4

## 2019-02-15 MED ORDER — INSULIN ASPART 100 UNIT/ML ~~LOC~~ SOLN
0.0000 [IU] | SUBCUTANEOUS | Status: DC
  Administered 2019-02-15: 20:00:00 1 [IU] via SUBCUTANEOUS
  Administered 2019-02-16: 17:00:00 3 [IU] via SUBCUTANEOUS
  Administered 2019-02-16: 1 [IU] via SUBCUTANEOUS
  Administered 2019-02-16: 21:00:00 5 [IU] via SUBCUTANEOUS
  Administered 2019-02-16: 2 [IU] via SUBCUTANEOUS
  Administered 2019-02-16: 1 [IU] via SUBCUTANEOUS
  Administered 2019-02-16: 12:00:00 2 [IU] via SUBCUTANEOUS
  Administered 2019-02-17: 22:00:00 5 [IU] via SUBCUTANEOUS
  Administered 2019-02-17 (×2): 3 [IU] via SUBCUTANEOUS
  Administered 2019-02-17 – 2019-02-18 (×3): 2 [IU] via SUBCUTANEOUS
  Administered 2019-02-18: 21:00:00 5 [IU] via SUBCUTANEOUS
  Administered 2019-02-18: 18:00:00 2 [IU] via SUBCUTANEOUS
  Administered 2019-02-18: 1 [IU] via SUBCUTANEOUS
  Administered 2019-02-19 (×6): 5 [IU] via SUBCUTANEOUS
  Administered 2019-02-20: 12:00:00 3 [IU] via SUBCUTANEOUS
  Administered 2019-02-20: 21:00:00 7 [IU] via SUBCUTANEOUS
  Administered 2019-02-20: 01:00:00 3 [IU] via SUBCUTANEOUS
  Administered 2019-02-20 – 2019-02-21 (×4): 2 [IU] via SUBCUTANEOUS

## 2019-02-15 MED ORDER — DEXTROSE-NACL 5-0.45 % IV SOLN: INTRAVENOUS | Status: DC

## 2019-02-15 MED ORDER — SODIUM CHLORIDE 0.9 % IV SOLN
100.0000 mg | Freq: Every day | INTRAVENOUS | Status: AC
  Administered 2019-02-16 – 2019-02-19 (×4): 100 mg via INTRAVENOUS
  Filled 2019-02-15 (×5): qty 20

## 2019-02-15 MED ORDER — SODIUM CHLORIDE 0.9% FLUSH
3.0000 mL | Freq: Two times a day (BID) | INTRAVENOUS | Status: DC
  Administered 2019-02-15 – 2019-02-27 (×23): 3 mL via INTRAVENOUS

## 2019-02-15 MED ORDER — DEXAMETHASONE SODIUM PHOSPHATE 10 MG/ML IJ SOLN
6.0000 mg | INTRAMUSCULAR | Status: DC
  Administered 2019-02-15 – 2019-02-19 (×5): 6 mg via INTRAVENOUS
  Filled 2019-02-15 (×5): qty 1

## 2019-02-15 MED ORDER — ONDANSETRON HCL 4 MG PO TABS: 4.0000 mg | ORAL_TABLET | Freq: Four times a day (QID) | ORAL | Status: DC | PRN

## 2019-02-15 MED ORDER — ONDANSETRON HCL 4 MG/2ML IJ SOLN: 4.0000 mg | Freq: Four times a day (QID) | INTRAMUSCULAR | Status: DC | PRN

## 2019-02-15 MED ORDER — LEVOTHYROXINE SODIUM 75 MCG PO TABS: 200.0000 ug | ORAL_TABLET | Freq: Every day | ORAL | Status: DC

## 2019-02-15 MED ORDER — OXYCODONE HCL 5 MG PO TABS
5.0000 mg | ORAL_TABLET | Freq: Once | ORAL | Status: AC
  Administered 2019-02-15: 17:00:00 5 mg via ORAL
  Filled 2019-02-15: qty 1

## 2019-02-15 MED ORDER — ASPIRIN 81 MG PO TBEC
81.0000 mg | DELAYED_RELEASE_TABLET | Freq: Every day | ORAL | Status: DC
  Administered 2019-02-15 – 2019-02-27 (×13): 81 mg via ORAL
  Filled 2019-02-15 (×23): qty 1

## 2019-02-15 MED ORDER — STERILE WATER FOR INJECTION IV SOLN
INTRAVENOUS | Status: DC
  Filled 2019-02-15 (×4): qty 850

## 2019-02-15 MED ORDER — ATORVASTATIN CALCIUM 40 MG PO TABS
80.0000 mg | ORAL_TABLET | Freq: Every day | ORAL | Status: DC
  Administered 2019-02-15 – 2019-02-26 (×12): 80 mg via ORAL
  Filled 2019-02-15 (×4): qty 2
  Filled 2019-02-15: qty 8
  Filled 2019-02-15 (×8): qty 2

## 2019-02-15 MED ORDER — ACETAMINOPHEN 325 MG PO TABS
650.0000 mg | ORAL_TABLET | Freq: Four times a day (QID) | ORAL | Status: DC | PRN
  Administered 2019-02-20: 650 mg via ORAL
  Filled 2019-02-15: qty 2

## 2019-02-15 MED ORDER — METRONIDAZOLE IN NACL 5-0.79 MG/ML-% IV SOLN
500.0000 mg | Freq: Three times a day (TID) | INTRAVENOUS | Status: DC
  Administered 2019-02-15 – 2019-02-18 (×10): 500 mg via INTRAVENOUS
  Filled 2019-02-15 (×12): qty 100

## 2019-02-15 MED ORDER — SODIUM CHLORIDE 0.9 % IV SOLN
1.0000 g | Freq: Every day | INTRAVENOUS | Status: DC
  Administered 2019-02-15: 04:00:00 1 g via INTRAVENOUS
  Filled 2019-02-15 (×2): qty 1

## 2019-02-15 MED ORDER — SODIUM CHLORIDE 0.9 % IV SOLN
1.0000 g | INTRAVENOUS | Status: DC
  Administered 2019-02-15 – 2019-02-17 (×3): 1 g via INTRAVENOUS
  Filled 2019-02-15 (×3): qty 10

## 2019-02-15 MED ORDER — FAMOTIDINE 20 MG PO TABS
20.0000 mg | ORAL_TABLET | Freq: Every day | ORAL | Status: DC
  Administered 2019-02-15 – 2019-02-18 (×4): 20 mg via ORAL
  Filled 2019-02-15 (×4): qty 1

## 2019-02-15 MED ORDER — INSULIN ASPART 100 UNIT/ML ~~LOC~~ SOLN: 0.0000 [IU] | Freq: Three times a day (TID) | SUBCUTANEOUS | Status: DC

## 2019-02-15 MED ORDER — LEVOTHYROXINE SODIUM 75 MCG PO TABS
200.0000 ug | ORAL_TABLET | Freq: Every day | ORAL | Status: DC
  Administered 2019-02-15: 200 ug via ORAL
  Filled 2019-02-15: qty 2

## 2019-02-15 MED ORDER — SODIUM CHLORIDE 0.9 % IV SOLN
200.0000 mg | Freq: Once | INTRAVENOUS | Status: AC
  Administered 2019-02-15: 14:00:00 200 mg via INTRAVENOUS
  Filled 2019-02-15: qty 40

## 2019-02-15 MED ORDER — LACTATED RINGERS IV SOLN: INTRAVENOUS | Status: DC

## 2019-02-15 NOTE — Progress Notes (Addendum)
Patient arrived via stretcher from [facility]. A&Ox4, room air, VSS, no pain at this time. CCMD called for admission. Patient assessment and admission complete. Patient resting in bed.Medication administered per order.  Call light in reach. Bed alarm in place. Will continue to monitor   1200: Daughter called for update and plan of care changes.

## 2019-02-15 NOTE — Plan of Care (Signed)
  Problem: Respiratory: Goal: Complications related to the disease process, condition or treatment will be avoided or minimized Outcome: Progressing   Problem: Respiratory: Goal: Will maintain a patent airway Outcome: Progressing

## 2019-02-15 NOTE — Progress Notes (Signed)
Patient has IVF running in left forearm IV. IV site infiltrated with 1+edema. IV removed, arm elevated on pillow, ice applied. No pain per patient.

## 2019-02-15 NOTE — Progress Notes (Signed)
OT Cancellation Note  Patient Details Name: [name] MRN: [id number] DOB: 06-Sep-1940   Cancelled Treatment:    Reason Eval/Treat Not Completed: Medical issues which prohibited therapy. OT consult received. Chart reviewed. Spoke with RN. Waiting on ultrasound of BLEs to check for DVTs. RN and OT in agreement to hold therapy until results received. OT will follow up tomorrow.   Mauri Brooklyn 02/15/2019, 2:43 PM

## 2019-02-15 NOTE — Progress Notes (Signed)
Patient complaint of joint pain from arthritis. Offered tylenol but patient refused. States she takes hydrocodone at home. Will notify MD.

## 2019-02-15 NOTE — Progress Notes (Signed)
PROGRESS NOTE    [name]  WLN:[id number] DOB: 07-May-1940 DOA: 02/14/2019 PCP: Jonathon Jordan, MD   Brief Narrative:  [name] is a 79 y.o. BF Jehovah's Witness PMHx hypothyroidism, CKD stage III, rheumatoid arthritis, DM type II controlled , chronic anemia, chronic pain, and aortic stenosis,   Presenting to the emergency department for evaluation of lethargy, nausea, vomiting, diarrhea, left lower quadrant abdominal pain, and loss of appetite.  Family called EMS due to patient being essentially bedbound and increasingly lethargic for the past few days.  Household contacts have had COVID-19 recently.  Patient is reportedly alert and fully oriented, and ambulates independently at her baseline, but family has been concerned that she has been too weak to get out of bed and today seemed to be too weak to even speak.  Patient became more alert in the emergency department and was able to provide further history, noting left lower quadrant pain that has been waxing and waning, nausea, nonbloody vomiting, and nonbloody diarrhea.  She has had chills, has had a nonproductive cough, but denies any shortness of breath.  She denies chest pain.  She reports that her legs sometimes swell but not so much recently.  She denies any headache, change in vision or hearing, or focal numbness or weakness.  ED Course: Upon arrival to the ED, patient is found to be febrile to 39.4 C, saturating mid to upper 90s on room air, and with normal heart rate, normal respirations, and stable blood pressure.  EKG features a sinus rhythm with new LBBB.  Chest x-ray is negative for acute cardiopulmonary disease. .   Subjective: T-max overnight 39.4 C, patient slow to answer questions some confusion but A/O x4.  States her and her husband obtained first Covid vaccination and are due to obtain second vaccination on Friday.  Unknown if husband Covid positive.   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  We will go ahead and start her on remdesivir and Decadron.  Given that her procalcitonin was elevated-is reasonable to continue antibiotics-but suspect we could narrow down to Rocephin/Flagyl.  I do not think patient has diverticulitis-abdominal exam is completely benign during my exam-but in any event-is covered by antibiotics.  COVID-19 Medications: Steroids: 2/9>> Remdesivir: 2/9>> -Incentive spirometry -Flutter valve  Diverticulitis? -See Covid gastroenteritis -Trend procalcitonin Results for DESIRIE, MINTEER (MRN [id number]) as of 02/15/2019 13:25  Ref. Range 02/15/2019 09:01  Procalcitonin Latest Units: ng/mL 1.16    Elevated D-dimer: Difficulty elevated D-dimer-patient is not hypoxic-we will start heparin at prophylactic dosing-and obtain lower extremity Doppler ultrasound.  AKI on CKD stage IIIb:  (Baseline Cr 1.68) -AKI likely hemodynamically mediated-from diarrhea and losartan use.  Does have significant proteinuria-this could be very well from Covid as well.   -Check bilateral renal ultrasound -follow electrolytes closely -avoid nephrotoxic  agents.  If creatinine does not improve-we will need nephrology evaluation. Recent Labs  Lab 02/15/19 0123 02/15/19 0239 02/15/19 0901  CREATININE 4.14* 4.20* 4.02*  -Gentle hydration  D5-0.45% saline 26ZT/IW  Acute metabolic encephalopathy: Still lethargic but improving-likely secondary to AKI and Covid infection-supportive care for now.  Follow clinical trajectory.  Anemia: Has anemia related to CKD at baseline-worsening anemia is likely secondary to acute illness.  No evidence of blood loss.  Continue Aranesp-and other supportive care for now-patient is a [employer] will refuse blood products-including PRBC transfusion if required-even in a life-threatening situation.  Monitor closely-minimize blood work is much as possible-difficult situation-has a significantly elevated D-dimer-patient patient is not hypoxic-for now we will just place on heparin at intermediate dosing-monitor CBC very closely.  Insulin-dependent DM-2: CBG stable on SSI  Minimally elevated troponin: No further work-up required-likely in the setting of renal failure.  HTN: Hold losartan-blood pressure currently controlled-monitor closely-if needed will start amlodipine.  Rheumatoid arthritis: Stable-leflunomide on hold currently-resume when able.  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  Supportive care.  Obesity: Estimated body mass index is 30.9 kg/m as calculated from the following:   Height as of this encounter: 5\' 4"  (1.626 m).   Weight as of this encounter: 81.6 kg.   Goals of care -2/9 PT/OT consult; evaluate for SNF   DVT prophylaxis: Heparin intermediate dosing Code Status: DNR Family Communication: 2/9 daughter updated over phone Disposition Plan: SNF on discharge   Consultants:    Procedures/Significant Events:     I have personally reviewed and interpreted all radiology studies and my findings are as above.  VENTILATOR SETTINGS: Room air 2/9 SPO2; 98%   Cultures   Antimicrobials: Anti-infectives (From admission, onward)   Start     Dose/Rate Stop    02/16/19 1000  remdesivir 100 mg in sodium chloride 0.9 % 100 mL IVPB     100 mg 200 mL/hr over 30 Minutes 02/20/19 0959   02/15/19 2200  cefTRIAXone (ROCEPHIN) 1 g in sodium chloride 0.9 % 100 mL IVPB     1 g 200 mL/hr over 30 Minutes     02/15/19 1200  metroNIDAZOLE (FLAGYL) IVPB 500 mg     500 mg 100 mL/hr over 60 Minutes     02/15/19 1200  remdesivir 200 mg in sodium chloride 0.9% 250 mL IVPB     200 mg 580 mL/hr over 30 Minutes 02/15/19 1436   02/15/19 0300  ceFEPIme (MAXIPIME) 1 g in sodium chloride 0.9 % 100 mL IVPB  Status:  Discontinued     1 g 200 mL/hr over 30 Minutes 02/15/19 1122   02/14/19 2100  metroNIDAZOLE (FLAGYL) IVPB 500 mg  Status:  Discontinued     500 mg 100 mL/hr over 60 Minutes 02/15/19 1102   02/14/19 2045  cefTRIAXone (ROCEPHIN) 1 g in sodium chloride 0.9 % 100 mL IVPB  Status:  Discontinued     1 g 200 mL/hr over 30 Minutes 02/15/19 0240   02/14/19 2045  vancomycin (VANCOCIN) IVPB 1000 mg/200 mL premix     1,000 mg 200 mL/hr over 60 Minutes 02/15/19 0334   02/14/19 2040  vancomycin variable dose per unstable renal function (pharmacist dosing)  Status:  Discontinued      02/15/19 5809       Devices    LINES / TUBES:      Continuous Infusions: . ceFEPime (MAXIPIME) IV Stopped (02/15/19  0448)  . metronidazole    .  sodium bicarbonate (isotonic) infusion in sterile water 100 mL/hr at 02/15/19 0353     Objective: Vitals:   02/15/19 0445 02/15/19 0500 02/15/19 0850 02/15/19 1053  BP: (!) 129/43 (!) 124/45 (!) 143/39 138/67  Pulse:  60 (!) 57 63  Resp:  (!) 23 18   Temp:    99.1 F (37.3 C)  TempSrc:    Oral  SpO2:  95% 98%   Weight:      Height:        Intake/Output Summary (Last 24 hours) at 02/15/2019 1115 Last data filed at 02/14/2019 1944 Gross per 24 hour  Intake --  Output 100 ml  Net -100 ml   Filed Weights   02/14/19 2307 02/14/19 2349  Weight: 81.6 kg 81.6 kg    Examination:  General: A/O x4, no acute respiratory  distress Eyes: negative scleral hemorrhage, negative anisocoria, negative icterus ENT: Negative Runny nose, negative gingival bleeding, Neck:  Negative scars, masses, torticollis, lymphadenopathy, JVD Lungs: Clear to auscultation bilaterally without wheezes or crackles Cardiovascular: Regular rate and rhythm without murmur gallop or rub normal S1 and S2 Abdomen: negative abdominal pain, nondistended, positive soft, bowel sounds, no rebound, no ascites, no appreciable mass Extremities: No significant cyanosis, clubbing, or edema bilateral lower extremities Skin: Negative rashes, lesions, ulcers Psychiatric:  Negative depression, negative anxiety, negative fatigue, negative mania, still lethargic answers questions appropriately but some encephalopathy apparent Central nervous system:  Cranial nerves II through XII intact, tongue/uvula midline, all extremities muscle strength 5/5, sensation intact throughout, negative dysarthria, negative expressive aphasia, negative receptive aphasia.  .     Data Reviewed: Care during the described time interval was provided by me .  I have reviewed this patient's available data, including medical history, events of note, physical examination, and all test results as part of my evaluation.   CBC: Recent Labs  Lab 02/14/19 2005 02/15/19 0239 02/15/19 0901  WBC 11.5*  --  8.8  NEUTROABS 7.8*  --   --   HGB 7.0* 8.2* 8.5*  HCT 21.0* 24.0* 26.9*  MCV 88.2  --  90.9  PLT 206  --  785   Basic Metabolic Panel: Recent Labs  Lab 02/15/19 0123 02/15/19 0239 02/15/19 0901  NA 143 144 142  K 5.2* 4.4 4.6  CL 112*  --  113*  CO2 15*  --  20*  GLUCOSE 93  --  87  BUN 57*  --  57*  CREATININE 4.14* 4.20* 4.02*  CALCIUM 7.5*  --  7.5*   GFR: Estimated Creatinine Clearance: 11.5 mL/min (A) (by C-G formula based on SCr of 4.02 mg/dL (H)). Liver Function Tests: Recent Labs  Lab 02/15/19 0123 02/15/19 0901  AST 38 38  ALT 24 23  ALKPHOS 65 71    BILITOT 0.8 0.7  PROT 5.6* 6.0*  ALBUMIN 2.1* 2.1*   Recent Labs  Lab 02/15/19 0123  LIPASE 33   No results for input(s): AMMONIA in the last 168 hours. Coagulation Profile: Recent Labs  Lab 02/15/19 0123  INR 1.2   Cardiac Enzymes: Recent Labs  Lab 02/15/19 0123  CKTOTAL 258*   BNP (last 3 results) No results for input(s): PROBNP in the last 8760 hours. HbA1C: No results for input(s): HGBA1C in the last 72 hours. CBG: No results for input(s): GLUCAP in the last 168 hours. Lipid Profile: No results for input(s): CHOL, HDL, LDLCALC, TRIG, CHOLHDL, LDLDIRECT in the last 72 hours. Thyroid Function  Tests: No results for input(s): TSH, T4TOTAL, FREET4, T3FREE, THYROIDAB in the last 72 hours. Anemia Panel: Recent Labs    02/15/19 0901  RETICCTPCT 0.5   Urine analysis:    Component Value Date/Time   COLORURINE AMBER (A) 02/14/2019 1945   APPEARANCEUR CLOUDY (A) 02/14/2019 1945   LABSPEC 1.014 02/14/2019 1945   LABSPEC 1.030 01/10/2010 1424   PHURINE 5.0 02/14/2019 1945   GLUCOSEU NEGATIVE 02/14/2019 1945   HGBUR SMALL (A) 02/14/2019 1945   BILIRUBINUR NEGATIVE 02/14/2019 1945   BILIRUBINUR not done 01/10/2010 [place] 02/14/2019 1945   PROTEINUR >=300 (A) 02/14/2019 1945   UROBILINOGEN 0.2 09/20/2014 1643   NITRITE NEGATIVE 02/14/2019 1945   LEUKOCYTESUR NEGATIVE 02/14/2019 1945   LEUKOCYTESUR Large 01/10/2010 1424   Sepsis Labs: @LABRCNTIP (procalcitonin:4,lacticidven:4)  ) Recent Results (from the past 240 hour(s))  Blood Culture (routine x 2)     Status: None (Preliminary result)   Collection Time: 02/14/19  6:44 PM   Specimen: BLOOD  Result Value Ref Range Status   Specimen Description BLOOD BLOOD LEFT FOREARM  Final   Special Requests   Final    BOTTLES DRAWN AEROBIC ONLY Blood Culture results may not be optimal due to an inadequate volume of blood received in culture bottles   Culture   Final    NO GROWTH < 12 HOURS Performed at  [facility], [place] [address] [place], [place] [address]    Report Status PENDING  Incomplete  Blood Culture (routine x 2)     Status: None (Preliminary result)   Collection Time: 02/14/19  9:10 PM   Specimen: BLOOD LEFT FOREARM  Result Value Ref Range Status   Specimen Description BLOOD LEFT FOREARM  Final   Special Requests   Final    BOTTLES DRAWN AEROBIC ONLY Blood Culture adequate volume   Culture   Final    NO GROWTH < 12 HOURS Performed at [facility], [place] [address] [place], [place] [address]    Report Status PENDING  Incomplete         Radiology Studies: DG Chest Port 1 View  Result Date: 02/14/2019 CLINICAL DATA:  Sepsis EXAM: PORTABLE CHEST 1 VIEW COMPARISON:  02/10/2018 FINDINGS: Heart and mediastinal contours are within normal limits. No focal opacities or effusions. No acute bony abnormality. IMPRESSION: No active disease. Electronically Signed   By: Rolm Baptise M.D.   On: 02/14/2019 18:29        Scheduled Meds: . aspirin  81 mg Oral Daily  . atorvastatin  80 mg Oral q1800  . darbepoetin (ARANESP) injection - NON-DIALYSIS  40 mcg Subcutaneous Q Wed-1800  . insulin aspart  0-6 Units Subcutaneous TID WC  . levothyroxine  200 mcg Oral Q0600  . sodium chloride flush  3 mL Intravenous Q12H   Continuous Infusions: . ceFEPime (MAXIPIME) IV Stopped (02/15/19 0448)  . metronidazole    .  sodium bicarbonate (isotonic) infusion in sterile water 100 mL/hr at 02/15/19 0353     LOS: 0 days   The patient is critically ill with multiple organ systems failure and requires high complexity decision making for assessment and support, frequent evaluation and titration of therapies, application of advanced monitoring technologies and extensive interpretation of multiple databases. Critical Care Time devoted to patient care services described in this note  Time spent: 40 minutes     Emelin Dascenzo, Geraldo Docker, MD [facility] Pager [phone]  If 7PM-7AM,  please contact night-coverage [email or web address] Password [email or web address]  02/15/2019, 11:15 AM

## 2019-02-15 NOTE — ED Notes (Signed)
Report given to [place], RN [facility] pt to go to room 9109.   [facility] called for transport. Pt and family: Daughter [name] updated in regards to plan for transfer to [facility]. Pt. Verbalized approval of transfer.

## 2019-02-15 NOTE — Progress Notes (Addendum)
PROGRESS NOTE                                                                                                                                                                                                             Patient Demographics:    [name], is a 79 y.o. female, DOB - May 07, 1940, UUE:[id number]  Outpatient Primary MD for the patient is Jonathon Jordan, MD   Admit date - 02/14/2019   LOS - 0  Chief Complaint  Patient presents with  . Altered Mental Status       Brief Narrative: Patient is a 79 y.o. 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  See below for further details.   Subjective:    Trilby Leaver today feels better-diarrhea is slowing down.  No nausea or vomiting.  Does complain of some cough today.   Assessment  & Plan :   COVID-19 infection with gastroenteritis: Continue supportive care-she is starting to have a little bit more cough-although on room air-she is at risk of having severe disease her inflammatory markers are significantly elevated.  We will go ahead and start her on remdesivir and Decadron.  Given that her procalcitonin was elevated-is reasonable to continue antibiotics-but suspect we could narrow down to Rocephin/Flagyl.  I do not think patient has diverticulitis-abdominal exam is completely benign during my exam-but in any event-is covered by antibiotics.  Fever: afebrile  O2 requirements:  SpO2: 98 %   COVID-19 Labs: Recent Labs    02/15/19 0901  DDIMER 17.07*  FERRITIN 612*  CRP 12.8*       Component Value Date/Time   BNP 73.0 02/14/2019 2023    Recent Labs  Lab 02/15/19 0901  PROCALCITON 1.16    No results found for: SARSCOV2NAA   COVID-19 Medications: Steroids:  2/9>> Remdesivir: 2/9>>  Other medications: Diuretics:Euvolemic-no need for lasix Antibiotics: Vancomycin: 2/8>> 2/9 Cefepime: 2/8>> 2/9 Rocephin: 2/8>> Flagyl: 2/8>>  Prone/Incentive Spirometry: encouraged  incentive spirometry use 3-4/hour.  DVT Prophylaxis  : Heparin at intermediate dosing  Elevated D-dimer: Difficulty elevated D-dimer-patient is not hypoxic-we will start heparin at prophylactic dosing-and obtain lower extremity Doppler ultrasound.  AKI on CKD stage IIIb: AKI  likely hemodynamically mediated-from diarrhea and losartan use.  Does have significant proteinuria-this could be very well from Covid as well.  Check bilateral renal ultrasound-follow electrolytes closely-avoid nephrotoxic agents.  If creatinine does not improve-we will need nephrology evaluation.  Acute metabolic encephalopathy: Still lethargic but improving-likely secondary to AKI and Covid infection-supportive care for now.  Follow clinical trajectory.  Anemia: Has anemia related to CKD at baseline-worsening anemia is likely secondary to acute illness.  No evidence of blood loss.  Continue Aranesp-and other supportive care for now-patient is a [employer] will refuse blood products-including PRBC transfusion if required-even in a life-threatening situation.  Monitor closely-minimize blood work is much as possible-difficult situation-has a significantly elevated D-dimer-patient patient is not hypoxic-for now we will just place on heparin at intermediate dosing-monitor CBC very closely.  Insulin-dependent DM-2: CBG stable on SSI  CBG (last 3)  No results for input(s): GLUCAP in the last 72 hours.   Minimally elevated troponin: No further work-up required-likely in the setting of renal failure.  HTN: Hold losartan-blood pressure currently controlled-monitor closely-if needed will start amlodipine.  Rheumatoid arthritis: Stable-leflunomide on hold currently-resume when able.  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  Supportive care.  Obesity: Estimated body mass index is 30.9 kg/m as calculated from the following:   Height as of this encounter: 5\' 4"  (1.626 m).   Weight as of this encounter: 81.6 kg.    GI prophylaxis: PPI  Consults  :  None  Procedures  :  None  ABG:    Component Value Date/Time   HCO3 16.9 (L) 02/15/2019 0239   TCO2 18 (L) 02/15/2019 0239   ACIDBASEDEF 7.0 (H) 02/15/2019 0239   O2SAT 97.0 02/15/2019 0239    Vent Settings: N/A  Condition - Extremely Guarded  Family Communication  :  Daughter updated over the phone 2/9  Code Status :  DNR (Dr. Myna Hidalgo informed daughter-this MD also spoke with daughter and informed her of patient's desire of not being resuscitated)  Diet :  Diet Order            Diet heart healthy/carb modified Room service appropriate? Yes; Fluid consistency: Thin  Diet effective now               Disposition Plan  :  Remain hospitalized-being transferred to [facility] will require home health or SNF on discharge.  Barriers to discharge: Hypoxia requiring O2 supplementation/complete 5 days of IV Remdesivir/severe AKI  Antimicorbials  :    Anti-infectives (From admission, onward)   Start     Dose/Rate Route Frequency Ordered Stop   02/16/19 1000  remdesivir 100 mg in sodium chloride 0.9 % 100 mL IVPB     100 mg 200 mL/hr over 30 Minutes Intravenous Daily 02/15/19 1130 02/20/19 0959   02/15/19 2200  cefTRIAXone (ROCEPHIN) 1 g in sodium chloride 0.9 % 100 mL IVPB     1 g 200 mL/hr over 30 Minutes Intravenous Every 24 hours 02/15/19 1122     02/15/19 1200  metroNIDAZOLE (FLAGYL) IVPB 500 mg     500 mg 100 mL/hr over 60 Minutes Intravenous Every 8 hours 02/15/19 1102     02/15/19 1200  remdesivir 200 mg in sodium chloride 0.9% 250 mL IVPB     200 mg 580 mL/hr over 30 Minutes Intravenous Once 02/15/19 1130      02/15/19 0300  ceFEPIme (MAXIPIME) 1 g in sodium chloride  0.9 % 100 mL IVPB  Status:  Discontinued     1 g 200 mL/hr over 30 Minutes Intravenous Daily 02/15/19 0240 02/15/19 1122   02/14/19 2100  metroNIDAZOLE (FLAGYL) IVPB 500 mg  Status:  Discontinued     500 mg 100 mL/hr over 60 Minutes Intravenous Every 8 hours 02/14/19 2046 02/15/19 1102   02/14/19 2045  cefTRIAXone (ROCEPHIN) 1 g in sodium chloride 0.9 % 100 mL IVPB  Status:  Discontinued     1 g 200 mL/hr over 30 Minutes Intravenous Every 24 hours 02/14/19 2042 02/15/19 0240   02/14/19 2045  vancomycin (VANCOCIN) IVPB 1000 mg/200 mL premix     1,000 mg 200 mL/hr over 60 Minutes Intravenous  Once 02/14/19 2042 02/15/19 0334   02/14/19 2040  vancomycin variable dose per unstable renal function (pharmacist dosing)  Status:  Discontinued      Does not apply See admin instructions 02/14/19 2040 02/15/19 0822      Inpatient Medications  Scheduled Meds: . aspirin  81 mg Oral Daily  . atorvastatin  80 mg Oral q1800  . darbepoetin (ARANESP) injection - NON-DIALYSIS  40 mcg Subcutaneous Q Wed-1800  . dexamethasone (DECADRON) injection  6 mg Intravenous Q24H  . famotidine  20 mg Oral Daily  . heparin injection (subcutaneous)  7,500 Units Subcutaneous Q8H  . insulin aspart  0-6 Units Subcutaneous TID WC  . levothyroxine  200 mcg Oral Q0600  . sodium chloride flush  3 mL Intravenous Q12H   Continuous Infusions: . cefTRIAXone (ROCEPHIN)  IV    . lactated ringers    . metronidazole    . remdesivir 200 mg in sodium chloride 0.9% 250 mL IVPB     Followed by  . [START ON 02/16/2019] remdesivir 100 mg in NS 100 mL     PRN Meds:.   Time Spent in minutes  35    See all Orders from today for further details   Oren Binet M.D on 02/15/2019 at 11:35 AM  To page go to [email or web address] - use universal password  [facility] -  Office  [phone]    Objective:   Vitals:   02/15/19 0445 02/15/19 0500 02/15/19 0850 02/15/19  1053  BP: (!) 129/43 (!) 124/45 (!) 143/39 138/67  Pulse:  60 (!) 57 63  Resp:  (!) 23 18   Temp:    99.1 F (37.3 C)  TempSrc:    Oral  SpO2:  95% 98%   Weight:      Height:        Wt Readings from Last 3 Encounters:  02/14/19 81.6 kg  01/12/19 82.1 kg  12/15/18 83 kg     Intake/Output Summary (Last 24 hours) at 02/15/2019 1135 Last data filed at 02/14/2019 1944 Gross per 24 hour  Intake --  Output 100 ml  Net -100 ml     Physical Exam Gen Exam:Alert awake-not in any distress HEENT:atraumatic, normocephalic Chest: B/L clear to auscultation anteriorly CVS:S1S2 regular Abdomen:soft non tender, non distended Extremities:trace edema3 Neurology: Non focal Skin: no rash   Data Review:    CBC Recent Labs  Lab 02/14/19 2005 02/15/19 0239 02/15/19 0901  WBC 11.5*  --  8.8  HGB 7.0* 8.2* 8.5*  HCT 21.0* 24.0* 26.9*  PLT 206  --  173  MCV 88.2  --  90.9  MCH 29.4  --  28.7  MCHC 33.3  --  31.6  RDW 15.8*  --  15.9*  LYMPHSABS 3.1  --   --  MONOABS 0.4  --   --   EOSABS 0.0  --   --   BASOSABS 0.0  --   --     Chemistries  Recent Labs  Lab 02/15/19 0123 02/15/19 0239 02/15/19 0901  NA 143 144 142  K 5.2* 4.4 4.6  CL 112*  --  113*  CO2 15*  --  20*  GLUCOSE 93  --  87  BUN 57*  --  57*  CREATININE 4.14* 4.20* 4.02*  CALCIUM 7.5*  --  7.5*  AST 38  --  38  ALT 24  --  23  ALKPHOS 65  --  71  BILITOT 0.8  --  0.7   ------------------------------------------------------------------------------------------------------------------ No results for input(s): CHOL, HDL, LDLCALC, TRIG, CHOLHDL, LDLDIRECT in the last 72 hours.  Lab Results  Component Value Date   HGBA1C 6.5 (H) 02/28/2016   ------------------------------------------------------------------------------------------------------------------ Recent Labs    02/15/19 0901  TSH 0.010*    ------------------------------------------------------------------------------------------------------------------ Recent Labs    02/15/19 0901  VITAMINB12 378  FERRITIN 612*  TIBC 140*  IRON 18*  RETICCTPCT 0.5    Coagulation profile Recent Labs  Lab 02/15/19 0123  INR 1.2    Recent Labs    02/15/19 0901  DDIMER 17.07*    Cardiac Enzymes No results for input(s): CKMB, TROPONINI, MYOGLOBIN in the last 168 hours.  Invalid input(s): CK ------------------------------------------------------------------------------------------------------------------    Component Value Date/Time   BNP 73.0 02/14/2019 2023    Micro Results Recent Results (from the past 240 hour(s))  Blood Culture (routine x 2)     Status: None (Preliminary result)   Collection Time: 02/14/19  6:44 PM   Specimen: BLOOD  Result Value Ref Range Status   Specimen Description BLOOD BLOOD LEFT FOREARM  Final   Special Requests   Final    BOTTLES DRAWN AEROBIC ONLY Blood Culture results may not be optimal due to an inadequate volume of blood received in culture bottles   Culture   Final    NO GROWTH < 12 HOURS Performed at [facility], [address] [place], [place] [address]    Report Status PENDING  Incomplete  Blood Culture (routine x 2)     Status: None (Preliminary result)   Collection Time: 02/14/19  9:10 PM   Specimen: BLOOD LEFT FOREARM  Result Value Ref Range Status   Specimen Description BLOOD LEFT FOREARM  Final   Special Requests   Final    BOTTLES DRAWN AEROBIC ONLY Blood Culture adequate volume   Culture   Final    NO GROWTH < 12 HOURS Performed at [facility], [place] [address], [place], [place] [address]    Report Status PENDING  Incomplete    Radiology Reports DG Chest Port 1 View  Result Date: 02/14/2019 CLINICAL DATA:  Sepsis EXAM: PORTABLE CHEST 1 VIEW COMPARISON:  02/10/2018 FINDINGS: Heart and mediastinal contours are within normal limits. No focal opacities  or effusions. No acute bony abnormality. IMPRESSION: No active disease. Electronically Signed   By: Rolm Baptise M.D.   On: 02/14/2019 18:29

## 2019-02-15 NOTE — ED Notes (Signed)
Updated daughter tracy.

## 2019-02-15 NOTE — Progress Notes (Signed)
Infusion orders are current for patient CBC CMP Tylenol Benadryl appointments are up to date and follow up appointment  is scheduled, yearly chest x-ray not due.

## 2019-02-15 NOTE — Progress Notes (Signed)
Dear Doctor This patient has been identified as a candidate for PICC for the following reason (s): IV therapy over 48 hours, drug pH or osmolality (causing phlebitis, infiltration in 24 hours) and restarts due to phlebitis and infiltration in 24 hours If you agree, please write an order for the indicated device. For any questions contact the Vascular Access Team at [phone] if no answer, please leave a message.  Thank you for supporting the early vascular access assessment program.

## 2019-02-15 NOTE — ED Notes (Signed)
Lab states needing 2 gold tubes.

## 2019-02-15 NOTE — Progress Notes (Addendum)
Pharmacy Antibiotic And Aranesp Note  [name] is a 79 y.o. female admitted on 02/14/2019 with intra-abd infection. Pt also on Flagyl and Vanc.  PT noted to be COVID positive.  Plan: D/c Rocephin Cefepime 1gm IV q24h Will f/u renal function, micro data, and pt's clinical condition  Addendum (4037): MD adding Aranesp per pharmacy in [place] with Hgb on admission 7, now up to 8.2.  Plan: Aranesp 40 mcg SQ q7 days Will f/u anemia panel   Height: 5\' 4"  (162.6 cm) Weight: 180 lb (81.6 kg) IBW/kg (Calculated) : 54.7  Temp (24hrs), Avg:101.4 F (38.6 C), Min:99.9 F (37.7 C), Max:102.9 F (39.4 C)  Recent Labs  Lab 02/14/19 2005 02/14/19 2127 02/15/19 0123  WBC 11.5*  --   --   CREATININE  --   --  4.14*  LATICACIDVEN  --  1.0  --     Estimated Creatinine Clearance: 11.2 mL/min (A) (by C-G formula based on SCr of 4.14 mg/dL (H)).    Allergies  Allergen Reactions  . Methotrexate Derivatives Other (See Comments)    Elevated creat. Levels  . Actemra [Tocilizumab] Rash  . Other Other (See Comments)    NO Blood products.  [name]- causes low blood sugar (pt states she was taking medication but cant remember what it was for.)  . Penicillins Rash    Has patient had a PCN reaction causing immediate rash, facial/tongue/throat swelling, SOB or lightheadedness with hypotension: Yes Has patient had a PCN reaction causing severe rash involving mucus membranes or skin necrosis: Yes  Has patient had a PCN reaction that required hospitalization No Has patient had a PCN reaction occurring within the last 10 years: No If all of the above answers are "NO", then may proceed with Cephalosporin use.   . Remicade [Infliximab] Other (See Comments)    Inadequate response   . Zocor [Simvastatin] Other (See Comments)    Leg cramps     Antimicrobials this admission: Vancomycin 2/8 >>  Ceftriaxone 2/8 x1  Flagyl 2/8 >> Cefepime 2/9>>   Microbiology  results: 2/8 BCx:  2/8 UCx:    Thank you for allowing pharmacy to be a part of this patient's care.  Sherlon Handing, PharmD, BCPS Please see amion for complete clinical pharmacist phone list 02/15/2019 2:36 AM

## 2019-02-15 NOTE — ED Notes (Signed)
Called [facility] for transport to [facility]--[name]  

## 2019-02-16 ENCOUNTER — Inpatient Hospital Stay (HOSPITAL_COMMUNITY): Payer: Medicare Other

## 2019-02-16 DIAGNOSIS — R791 Abnormal coagulation profile: Secondary | ICD-10-CM

## 2019-02-16 LAB — COMPREHENSIVE METABOLIC PANEL
ALT: 34 U/L (ref 0–44)
AST: 63 U/L — ABNORMAL HIGH (ref 15–41)
Albumin: 2.2 g/dL — ABNORMAL LOW (ref 3.5–5.0)
Alkaline Phosphatase: 95 U/L (ref 38–126)
Anion gap: 11 (ref 5–15)
BUN: 65 mg/dL — ABNORMAL HIGH (ref 8–23)
CO2: 21 mmol/L — ABNORMAL LOW (ref 22–32)
Calcium: 7.9 mg/dL — ABNORMAL LOW (ref 8.9–10.3)
Chloride: 109 mmol/L (ref 98–111)
Creatinine, Ser: 3.52 mg/dL — ABNORMAL HIGH (ref 0.44–1.00)
GFR calc Af Amer: 13 mL/min — ABNORMAL LOW (ref >60)
GFR calc non Af Amer: 12 mL/min — ABNORMAL LOW (ref >60)
Glucose, Bld: 134 mg/dL — ABNORMAL HIGH (ref 70–99)
Potassium: 4.7 mmol/L (ref 3.5–5.1)
Sodium: 141 mmol/L (ref 135–145)
Total Bilirubin: 0.7 mg/dL (ref 0.3–1.2)
Total Protein: 5.6 g/dL — ABNORMAL LOW (ref 6.5–8.1)

## 2019-02-16 LAB — TYPE AND SCREEN
ABO/RH(D): O POS
Antibody Screen: NEGATIVE

## 2019-02-16 LAB — C-REACTIVE PROTEIN: CRP: 11.4 mg/dL — ABNORMAL HIGH (ref <1.0)

## 2019-02-16 LAB — HEMOGLOBIN A1C
Hgb A1c MFr Bld: 6.6 % — ABNORMAL HIGH (ref 4.8–5.6)
Mean Plasma Glucose: 143 mg/dL

## 2019-02-16 LAB — GLUCOSE, CAPILLARY
Glucose-Capillary: 119 mg/dL — ABNORMAL HIGH (ref 70–99)
Glucose-Capillary: 124 mg/dL — ABNORMAL HIGH (ref 70–99)
Glucose-Capillary: 138 mg/dL — ABNORMAL HIGH (ref 70–99)
Glucose-Capillary: 166 mg/dL — ABNORMAL HIGH (ref 70–99)
Glucose-Capillary: 176 mg/dL — ABNORMAL HIGH (ref 70–99)
Glucose-Capillary: 230 mg/dL — ABNORMAL HIGH (ref 70–99)
Glucose-Capillary: 265 mg/dL — ABNORMAL HIGH (ref 70–99)

## 2019-02-16 LAB — CBC WITH DIFFERENTIAL/PLATELET
Abs Immature Granulocytes: 0.05 10*3/uL (ref 0.00–0.07)
Basophils Absolute: 0 10*3/uL (ref 0.0–0.1)
Basophils Relative: 0 %
Eosinophils Absolute: 0.1 10*3/uL (ref 0.0–0.5)
Eosinophils Relative: 1 %
HCT: 25.1 % — ABNORMAL LOW (ref 36.0–46.0)
Hemoglobin: 8.3 g/dL — ABNORMAL LOW (ref 12.0–15.0)
Immature Granulocytes: 1 %
Lymphocytes Relative: 39 %
Lymphs Abs: 2.9 10*3/uL (ref 0.7–4.0)
MCH: 28.8 pg (ref 26.0–34.0)
MCHC: 33.1 g/dL (ref 30.0–36.0)
MCV: 87.2 fL (ref 80.0–100.0)
Monocytes Absolute: 0.4 10*3/uL (ref 0.1–1.0)
Monocytes Relative: 6 %
Neutro Abs: 3.9 10*3/uL (ref 1.7–7.7)
Neutrophils Relative %: 53 %
Platelets: 167 10*3/uL (ref 150–400)
RBC: 2.88 MIL/uL — ABNORMAL LOW (ref 3.87–5.11)
RDW: 15.6 % — ABNORMAL HIGH (ref 11.5–15.5)
WBC: 7.3 10*3/uL (ref 4.0–10.5)
nRBC: 0 % (ref 0.0–0.2)

## 2019-02-16 LAB — PROCALCITONIN: Procalcitonin: 1 ng/mL

## 2019-02-16 LAB — D-DIMER, QUANTITATIVE: D-Dimer, Quant: 12.53 ug/mL-FEU — ABNORMAL HIGH (ref 0.00–0.50)

## 2019-02-16 LAB — URINE CULTURE: Culture: NO GROWTH

## 2019-02-16 LAB — APTT: aPTT: 29 seconds (ref 24–36)

## 2019-02-16 LAB — FERRITIN: Ferritin: 568 ng/mL — ABNORMAL HIGH (ref 11–307)

## 2019-02-16 LAB — MAGNESIUM: Magnesium: 2 mg/dL (ref 1.7–2.4)

## 2019-02-16 LAB — PHOSPHORUS: Phosphorus: 3.5 mg/dL (ref 2.5–4.6)

## 2019-02-16 MED ORDER — ALLOPURINOL 100 MG PO TABS
300.0000 mg | ORAL_TABLET | Freq: Every day | ORAL | Status: DC
  Administered 2019-02-16 – 2019-02-20 (×5): 300 mg via ORAL
  Filled 2019-02-16 (×5): qty 3

## 2019-02-16 MED ORDER — DICLOFENAC SODIUM 1 % EX GEL
1.0000 "application " | Freq: Four times a day (QID) | CUTANEOUS | Status: DC
  Administered 2019-02-16 – 2019-02-18 (×8): 1 via TOPICAL
  Filled 2019-02-16: qty 100

## 2019-02-16 MED ORDER — CYCLOBENZAPRINE HCL 10 MG PO TABS
10.0000 mg | ORAL_TABLET | Freq: Three times a day (TID) | ORAL | Status: DC | PRN
  Administered 2019-02-16 – 2019-02-26 (×4): 10 mg via ORAL
  Filled 2019-02-16 (×4): qty 1

## 2019-02-16 MED ORDER — HYDROCODONE-ACETAMINOPHEN 5-325 MG PO TABS
1.0000 | ORAL_TABLET | Freq: Four times a day (QID) | ORAL | Status: DC | PRN
  Administered 2019-02-16 – 2019-02-24 (×7): 1 via ORAL
  Filled 2019-02-16 (×7): qty 1

## 2019-02-16 NOTE — Progress Notes (Signed)
Lower extremity venous has been completed.   Preliminary results in CV Proc.   Abram Sander 02/16/2019 12:34 PM

## 2019-02-16 NOTE — Evaluation (Signed)
Physical Therapy Evaluation Patient Details Name: [name] MRN: [id number] DOB: [birth year] Today's Date: 02/16/2019   History of Present Illness  [name] is a 79 y.o. BF Jehov[employer]'s Witness PMHx hypothyroidism, CKD stage III, rheumatoid arthritis, DM type II controlled , chronic anemia, chronic pain, and aortic stenosis, removal intracranial meneigioma. Presenting to the emergency department 02/14/19  for evaluation of lethargy, nausea, vomiting, diarrhea, left lower quadrant abdominal pain, and loss of appetite.  Family called EMS due to patient being essentially bedbound and increasingly lethargic for the past few days.  Household contacts have had COVID-19 recently. Patient positive for Covid virus.  Clinical Impression    The patient required  Multimodal cues and extra time for mobilizing to edge of bed and standing up, and a few sidesteps. The patient was not able to indicate if anyone other than spouse will be availalble. Patient is oriented to day and that she has covid. States that she had a covid vaccine 3 weeks ago and is to go Friday for her second shot to [place]. The patient had been in recliner and was back in bed and  Declined back into recliner. Attempted call to [name] , daughter, for info. Line busy. Pt admitted with above diagnosis. Pt currently with functional limitations due to the deficits listed below (see PT Problem List). Pt will benefit from skilled PT to increase their independence and safety with mobility to allow discharge to the venue listed below.    PTA/illness, patient was independent with self care and ambulation.   Follow Up Recommendations Home health PT;Supervision/Assistance - 24 hour    Equipment Recommendations  (4 wheeled RW)    Recommendations for Other Services       Precautions / Restrictions Precautions Precautions: Fall Restrictions Weight Bearing Restrictions: No      Mobility  Bed Mobility Overal bed mobility: Needs  Assistance Bed Mobility: Supine to Sit;Sit to Supine     Supine to sit: Min assist Sit to supine: Min assist   General bed mobility comments: extra time to initiate activity.assit with trunk to sit up and legs back onto bed.  Transfers Overall transfer level: Needs assistance Equipment used: Rolling walker (2 wheeled) Transfers: Sit to/from Stand Sit to Stand: Min assist         General transfer comment: steady assist to stand from bed, side steps along bed with extra time to perform, complained of feeling dizzy. BP after return to supine 156/81  Ambulation/Gait             General Gait Details: NT only side steps  Stairs            Wheelchair Mobility    Modified Rankin (Stroke Patients Only)       Balance Overall balance assessment: Needs assistance Sitting-balance support: Bilateral upper extremity supported;Feet supported Sitting balance-Leahy Scale: Fair     Standing balance support: Bilateral upper extremity supported;During functional activity Standing balance-Leahy Scale: Poor Standing balance comment: reliant on UE                             Pertinent Vitals/Pain Pain Assessment: No/denies pain    Home Living Family/patient expects to be discharged to:: Private residence Living Arrangements: Spouse/significant other;Children Available Help at Discharge: Family;Available 24 hours/day Type of Home: House Home Access: Stairs to enter   [employer] of Steps: 3 Home Layout: Two level;Able to live on main level with bedroom/bathroom Home Equipment: Gilford Rile -  2 wheels;Cane - single point;Bedside commode;Shower seat;Transport chair      Prior Function Level of Independence: Independent with assistive device(s)         Comments: uses RW or cane PRN     Hand Dominance   Dominant Hand: Right    Extremity/Trunk Assessment        Lower Extremity Assessment Lower Extremity Assessment: Generalized weakness     Cervical / Trunk Assessment Cervical / Trunk Assessment: Normal  Communication   Communication: No difficulties  Cognition Arousal/Alertness: Awake/alert Behavior During Therapy: WFL for tasks assessed/performed Overall Cognitive Status: Impaired/Different from baseline Area of Impairment: Memory;Following commands;Awareness;Orientation                     Memory: Decreased short-term memory Following Commands: Follows one step commands with increased time   Awareness: Emergent   General Comments: oriented to hospital and covid, discrepency in stating who besides spouse will be available. did report that whe had Covid vaccine 3 wks ago and to go Friday for the second.  Tangential, giving history of living in a house that the foundation was sinking and had to move to another  house last year.      General Comments      Exercises     Assessment/Plan    PT Assessment Patient needs continued PT services  PT Problem List Decreased strength;Decreased mobility;Decreased safety awareness;Decreased knowledge of precautions;Decreased activity tolerance;Decreased cognition;Decreased balance;Decreased knowledge of use of DME       PT Treatment Interventions DME instruction;Therapeutic activities;Gait training;Therapeutic exercise;Cognitive remediation;Patient/family education;Functional mobility training    PT Goals (Current goals can be found in the Care Plan section)  Acute Rehab PT Goals Patient Stated Goal: to go home PT Goal Formulation: With patient Time For Goal Achievement: 03/02/19 Potential to Achieve Goals: Good    Frequency Min 3X/week   Barriers to discharge        Co-evaluation               AM-PAC PT "6 Clicks" Mobility  Outcome Measure Help needed turning from your back to your side while in a flat bed without using bedrails?: A Little Help needed moving from lying on your back to sitting on the side of a flat bed without using bedrails?: A  Little Help needed moving to and from a bed to a chair (including a wheelchair)?: A Lot Help needed standing up from a chair using your arms (e.g., wheelchair or bedside chair)?: A Lot Help needed to walk in hospital room?: A Lot Help needed climbing 3-5 steps with a railing? : Total 6 Click Score: 13    End of Session   Activity Tolerance: Patient tolerated treatment well Patient left: in bed;with call bell/phone within reach;with bed alarm set Nurse Communication: Mobility status PT Visit Diagnosis: Unsteadiness on feet (R26.81);Difficulty in walking, not elsewhere classified (R26.2)    Time: 0947-0962 PT Time Calculation (min) (ACUTE ONLY): 43 min   Charges:   PT Evaluation $PT Eval Moderate Complexity: 1 Mod PT Treatments $Therapeutic Activity: 23-37 mins        Tresa Endo PT Acute Rehabilitation Services Pager [phone] Office [phone]   Claretha Cooper 02/16/2019, 2:11 PM

## 2019-02-16 NOTE — Progress Notes (Signed)
Occupational Therapy Evaluation Patient Details Name: [name] MRN: [id number] DOB: 07/01/1940 Today's Date: 02/16/2019    History of Present Illness [name] is a 79 y.o. BF Jehovah's Witness PMHx hypothyroidism, CKD stage III, rheumatoid arthritis, DM type II controlled , chronic anemia, chronic pain, and aortic stenosis, removal intracranial meneigioma. Presenting to the emergency department 02/14/19  for evaluation of lethargy, nausea, vomiting, diarrhea, left lower quadrant abdominal pain, and loss of appetite.  Family called EMS due to patient being essentially bedbound and increasingly lethargic for the past few days.  Household contacts have had COVID-19 recently. Patient positive for Covid virus.   Clinical Impression   PTA pt resided with husband and family, independent in ADLs and mobility. Pt reports ambulating with a cane during the day and a walker at night. Pt states that she's had 0 falls in the last 6 months. Pt able to ambulate to/from bathroom with RW and min assist for balance and safety. Pt is a slow ambulator requiring cues throughout to attend to task. Pt completed toileting task, requiring mod assist for transfer transfer, toilet hygiene following BM, and clothing management. Educated pt on safety strategies and fall prevention techniques with fair understanding. Pt required increased time and cues to process instructions and information. Unsure if this is her baseline. Pt on room air with SpO2 maintaining in 90s throughout. RN updated. Pt demonstrates decreased cognition, ROM, strength, endurance, balance, standing tolerance, and activity tolerance impacting ability to complete self-care and functional transfer tasks. Recommend skilled OT services to address above deficits in order to promote function and prevent further decline. Recommend SNF placement for additional rehab prior to discharge home.     Follow Up Recommendations  SNF(may progress to [facility] OT and  24/7 assist.)    Equipment Recommendations  None recommended by OT    Recommendations for Other Services       Precautions / Restrictions Precautions Precautions: Fall Restrictions Weight Bearing Restrictions: No      Mobility Bed Mobility Overal bed mobility: Needs Assistance Bed Mobility: Supine to Sit     Supine to sit: Min guard;HOB elevated Sit to supine: Min assist   General bed mobility comments: Increased time needed to complete.   Transfers Overall transfer level: Needs assistance Equipment used: Rolling walker (2 wheeled) Transfers: Sit to/from [facility] Sit to Stand: Min assist Stand pivot transfers: Min assist       General transfer comment: Min assist for balance. Pt reports dizziness upon standing which subsides after ~1 min static standing.     Balance Overall balance assessment: Needs assistance Sitting-balance support: Bilateral upper extremity supported;Feet supported Sitting balance-Leahy Scale: Fair     Standing balance support: Bilateral upper extremity supported;During functional activity Standing balance-Leahy Scale: Poor Standing balance comment: reliant on UE                           ADL either performed or assessed with clinical judgement   ADL Overall ADL's : Needs assistance/impaired Eating/Feeding: Set up;Sitting   Grooming: Set up;Supervision/safety;Sitting   Upper Body Bathing: Minimal assistance;Sitting   Lower Body Bathing: Moderate assistance;Sit to/from stand   Upper Body Dressing : Minimal assistance;Sitting   Lower Body Dressing: Moderate assistance;Sit to/from stand   Toilet Transfer: Moderate assistance;Regular Toilet;Ambulation;Grab bars   Toileting- Clothing Manipulation and Hygiene: Moderate assistance;Sit to/from stand       Functional mobility during ADLs: Minimal assistance;Rolling walker General ADL Comments: Pt able  to ambulate to/from bathroom with RW and min assist for  balance. Pt is a slow ambulator, requiring cues to attend to task.     Vision Baseline Vision/History: Wears glasses Wears Glasses: Reading only       Perception     Praxis      Pertinent Vitals/Pain Pain Assessment: Faces Faces Pain Scale: Hurts even more Pain Location: All joints Pain Descriptors / Indicators: Aching Pain Intervention(s): Monitored during session;Repositioned;Limited activity within patient's tolerance     Hand Dominance Right   Extremity/Trunk Assessment Upper Extremity Assessment Upper Extremity Assessment: RUE deficits/detail;LUE deficits/detail;Generalized weakness RUE Deficits / Details: Shld flex AROM ~45 degrees. Elbow, wrist, and digit ROM WFL.  LUE Deficits / Details: Shld flex AROM ~45 degrees. Elbow, wrist, and digit ROM WFL.    Lower Extremity Assessment Lower Extremity Assessment: Defer to PT evaluation   Cervical / Trunk Assessment Cervical / Trunk Assessment: Normal   Communication Communication Communication: Other (comment)(Quiet speech. Requires increased time to process cues.)   Cognition Arousal/Alertness: Awake/alert Behavior During Therapy: Flat affect Overall Cognitive Status: Impaired/Different from baseline Area of Impairment: Attention;Following commands;Awareness                   Current Attention Level: Sustained Memory: Decreased short-term memory Following Commands: Follows one step commands with increased time;Follows one step commands inconsistently   Awareness: Emergent   General Comments: Pt requires increased time to process questions and respond. Requires mod repeat of instructions as well as redirection back to tasks.    General Comments  Pt on room air with SpO2 maintaining in 90s throughout. No reports of SOB.     Exercises     Shoulder Instructions      Home Living Family/patient expects to be discharged to:: Private residence Living Arrangements: Spouse/significant other;Children Available  Help at Discharge: Family;Available 24 hours/day Type of Home: House Home Access: Stairs to enter [employer] of Steps: 3   Home Layout: Two level;Able to live on main level with bedroom/bathroom     Bathroom Shower/Tub: Tub/shower unit         Home Equipment: Walker - 2 wheels;Cane - single point;Bedside commode;Shower seat;Transport chair          Prior Functioning/Environment Level of Independence: Independent with assistive device(s)        Comments: Pt reports being independent in ADLs. Pt reports using cane during the day and RW at night. Pt reports 0 falls in the last 6 months. Pt's family assists with IADLs.          OT Problem List: Decreased strength;Decreased range of motion;Decreased activity tolerance;Impaired balance (sitting and/or standing);Decreased cognition;Cardiopulmonary status limiting activity      OT Treatment/Interventions: Self-care/ADL training;Therapeutic exercise;Neuromuscular education;Energy conservation;DME and/or AE instruction;Therapeutic activities;Patient/family education;Balance training    OT Goals(Current goals can be found in the care plan section) Acute Rehab OT Goals Patient Stated Goal: to go home Time For Goal Achievement: 03/02/19 Potential to Achieve Goals: Good ADL Goals Pt Will Perform Grooming: with modified independence;standing Pt Will Perform Lower Body Bathing: with modified independence;sit to/from stand Pt Will Perform Lower Body Dressing: with modified independence;sit to/from stand Pt Will Transfer to Toilet: with modified independence;ambulating;regular height toilet Pt Will Perform Toileting - Clothing Manipulation and hygiene: with modified independence;sit to/from stand Additional ADL Goal #1: Pt to tolerate standing up to 10 min with modified independence, in preparation for ADLs. Additional ADL Goal #2: Pt to recall and verbalize 3 fall prevention strategies with min verbal  cues.  OT Frequency: Min  3X/week   Barriers to D/C:            Co-evaluation              AM-PAC OT "6 Clicks" Daily Activity     Outcome Measure Help from another person eating meals?: A Little Help from another person taking care of personal grooming?: A Little Help from another person toileting, which includes using toliet, bedpan, or urinal?: A Lot Help from another person bathing (including washing, rinsing, drying)?: A Lot Help from another person to put on and taking off regular upper body clothing?: A Little Help from another person to put on and taking off regular lower body clothing?: A Lot 6 Click Score: 15   End of Session Equipment Utilized During Treatment: Gait belt;Rolling walker Nurse Communication: Mobility status  Activity Tolerance: Patient limited by fatigue;Patient limited by pain Patient left: in chair;with call bell/phone within reach;with chair alarm set  OT Visit Diagnosis: Unsteadiness on feet (R26.81);Muscle weakness (generalized) (M62.81)                Time: 5009-3818 OT Time Calculation (min): 46 min Charges:  OT General Charges $OT Visit: 1 Visit OT Evaluation $OT Eval Moderate Complexity: 1 Mod OT Treatments $Self Care/Home Management : 23-37 mins  Mauri Brooklyn OTR/L [phone]  Mauri Brooklyn 02/16/2019, 3:42 PM

## 2019-02-16 NOTE — Progress Notes (Signed)
Informed by MD Verlon Au that he would "update tracy (daughter) going forward" to decrease confusion with family. Nurse to adhere to order and relay to future staff.

## 2019-02-16 NOTE — Progress Notes (Signed)
Notified MD Samtani regard pt and family inquiring about home Hydrocodone medication to be reinstated on MAR. Also, suggested that Glucerna order be placed due to poor eating. Please see new orders. Will continue to monitor.

## 2019-02-16 NOTE — Progress Notes (Signed)
Hospitalist progress note   Patient from home, Patient going likely home after discharge, Dispo pending at this time improvement of renal function in addition to hydration status and work-up of diarrhea  [name] [id number] DOB: August 31, 1940 DOA: 02/14/2019  PCP: Jonathon Jordan, MD   Narrative:  79 year old JEHOVAH's WITNESS, CKD 4, hypothyroid and is status post thyroidectomy 1990-follows with Dr.Kerr, rheumatoid arthritis HTN, upper extremity DVT 2017, CVA 2018, mild aortic stenosis, DM TY 2, prior hepatic hypodensity 2/5 through 02/12/2018 symptomatic bradycardia, near syncope--- at the hospital stay evaluated and found to have worsening aortic stenosis Patient has been evaluated in the past by EP did not feel she needed a pacemaker at that time also had a CVA 02/2016   Data Reviewed:  BUN/creatinine baseline 50/4-->65/3.5, bicarb 21 AST/ALT 63/34 Iron 18 TIBC 140 ferritin 6:12 (A OCD) Hemoglobin baseline usually is 10 range on admission 7.0 currently 8.3  COVID-19 Labs  Recent Labs    02/15/19 0901 02/16/19 0241  DDIMER 17.07* 12.53*  FERRITIN 612* 568*  CRP 12.8* 11.4*    No results found for: [place]:  Coronavirus 19 infection Continuing ceftriaxone and Flagyl for empiric coverage of pneumonia given elevated CRP Dimer was elevated prompting lower extremity Dopplers which are negative for DVT 2/10 Continue remdesivir/Decadron 2/9--2/13 Given patient has rheumatoid arthritis history unclear if candidate for Actemra and certainly does not meet criteria right now ?  Diarrheal disease Nursing reports only 1 putting consistency stool this morning and no recurrence Hold on tracking C. difficile unless she has 3 stools in 24 hours-stop laxatives-continue empiric treatment with ceftriaxone and Flagyl at this time and de-escalate as as needed Jehovah's Witness Monitor trends-continue iron supplementation may consider IV iron although ferritin is a  contraindication AKI Close to baseline-no follow-up for work-up at this time Rheumatoid arthritis Follows with Dr. Arlean Hopping-- Continue for now  at this time I am holding leflunomide 10 daily abatacept 750 q. 28 daily  Upper extremity DVT 2017 Unclear if was ever anticoagulated-only on aspirin 81 mg at home CVA 2018 aortic stenosis-severe calcification of valve mean gradient 18 mm AVA 0.8 cm?  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 Check outpatient TSH Hepatic hypodensity Most recent ultrasound performed of kidneys did not show anything specific on liver May need outpatient characterization of the same  Called and discussed with daughter [phone] and updated completely and called At home she self-medicates and can manage--she ambulates slowly and has a small distance and can go from bed to bathroom ~ 10 feet  Subjective: Can tell me somewhat what is going on with her but otherwise seems quite confused and slow in response No chest pain no fever no chills Eating drinking minimally but seems quite weak on sitting up  Consultants:   None at this time  Objective: Vitals:   02/15/19 1911 02/15/19 2346 02/16/19 0436 02/16/19 0759  BP: (!) 132/54 (!) 102/52 (!) 118/49 (!) 145/51  Pulse: 62 (!) 59 61 (!) 57  Resp:  14 13 13   Temp: 97.7 F (36.5 C) 98.8 F (37.1 C) 99 F (37.2 C) 99.1 F (37.3 C)  TempSrc: Axillary Oral Oral Oral  SpO2: 96% 90% 94% 94%  Weight:      Height:        Intake/Output Summary (Last 24 hours) at 02/16/2019 1537 Last data filed at 02/16/2019 0406 Gross  per 24 hour  Intake 428.25 ml  Output --  Net 428.25 ml   Filed Weights   02/14/19 2307 02/14/19 2349  Weight: 81.6 kg 81.6 kg    Examination: Awake alert frail black female no distress edentulous EOMI NCAT no focal deficit S1-S2 no murmur rub or gallop CTA B no added sound no rales Abdomen soft no rebound no guarding ROM intact grossly  Scheduled Meds: . allopurinol  300 mg Oral Daily  . aspirin  81 mg Oral Daily  . atorvastatin  80 mg Oral q1800  . darbepoetin (ARANESP) injection - NON-DIALYSIS  40 mcg Subcutaneous Q Wed-1800  . dexamethasone (DECADRON) injection  6 mg Intravenous Q24H  . diclofenac Sodium  1 application Topical QID  . famotidine  20 mg Oral Daily  . heparin injection (subcutaneous)  7,500 Units Subcutaneous Q8H  . insulin aspart  0-9 Units Subcutaneous Q4H  . sodium chloride flush  3 mL Intravenous Q12H   Continuous Infusions: . cefTRIAXone (ROCEPHIN)  IV Stopped (02/16/19 0826)  . dextrose 5 % and 0.45% NaCl 75 mL/hr at 02/15/19 1654  . ferumoxytol 510 mg (02/15/19 1536)  . metronidazole 500 mg (02/16/19 1224)  . remdesivir 100 mg in NS 100 mL 100 mg (02/16/19 1015)     LOS: 1 day   Time spent: [place], MD [facility]  02/16/2019, 8:33 AM

## 2019-02-17 ENCOUNTER — Telehealth: Payer: Self-pay | Admitting: Rheumatology

## 2019-02-17 LAB — CBC WITH DIFFERENTIAL/PLATELET
Abs Immature Granulocytes: 0.05 10*3/uL (ref 0.00–0.07)
Basophils Absolute: 0 10*3/uL (ref 0.0–0.1)
Basophils Relative: 0 %
Eosinophils Absolute: 0 10*3/uL (ref 0.0–0.5)
Eosinophils Relative: 0 %
HCT: 26.4 % — ABNORMAL LOW (ref 36.0–46.0)
Hemoglobin: 8.3 g/dL — ABNORMAL LOW (ref 12.0–15.0)
Immature Granulocytes: 1 %
Lymphocytes Relative: 36 %
Lymphs Abs: 1.4 10*3/uL (ref 0.7–4.0)
MCH: 28.2 pg (ref 26.0–34.0)
MCHC: 31.4 g/dL (ref 30.0–36.0)
MCV: 89.8 fL (ref 80.0–100.0)
Monocytes Absolute: 0.3 10*3/uL (ref 0.1–1.0)
Monocytes Relative: 8 %
Neutro Abs: 2.1 10*3/uL (ref 1.7–7.7)
Neutrophils Relative %: 55 %
Platelets: 179 10*3/uL (ref 150–400)
RBC: 2.94 MIL/uL — ABNORMAL LOW (ref 3.87–5.11)
RDW: 15.6 % — ABNORMAL HIGH (ref 11.5–15.5)
WBC: 3.8 10*3/uL — ABNORMAL LOW (ref 4.0–10.5)
nRBC: 0 % (ref 0.0–0.2)

## 2019-02-17 LAB — COMPREHENSIVE METABOLIC PANEL
ALT: 26 U/L (ref 0–44)
AST: 33 U/L (ref 15–41)
Albumin: 2.2 g/dL — ABNORMAL LOW (ref 3.5–5.0)
Alkaline Phosphatase: 84 U/L (ref 38–126)
Anion gap: 12 (ref 5–15)
BUN: 74 mg/dL — ABNORMAL HIGH (ref 8–23)
CO2: 17 mmol/L — ABNORMAL LOW (ref 22–32)
Calcium: 8.2 mg/dL — ABNORMAL LOW (ref 8.9–10.3)
Chloride: 110 mmol/L (ref 98–111)
Creatinine, Ser: 3.72 mg/dL — ABNORMAL HIGH (ref 0.44–1.00)
GFR calc Af Amer: 13 mL/min — ABNORMAL LOW (ref >60)
GFR calc non Af Amer: 11 mL/min — ABNORMAL LOW (ref >60)
Glucose, Bld: 174 mg/dL — ABNORMAL HIGH (ref 70–99)
Potassium: 5.1 mmol/L (ref 3.5–5.1)
Sodium: 139 mmol/L (ref 135–145)
Total Bilirubin: 0.4 mg/dL (ref 0.3–1.2)
Total Protein: 5.4 g/dL — ABNORMAL LOW (ref 6.5–8.1)

## 2019-02-17 LAB — SODIUM, URINE, RANDOM: Sodium, Ur: 39 mmol/L

## 2019-02-17 LAB — MAGNESIUM: Magnesium: 1.9 mg/dL (ref 1.7–2.4)

## 2019-02-17 LAB — D-DIMER, QUANTITATIVE: D-Dimer, Quant: 7.52 ug/mL-FEU — ABNORMAL HIGH (ref 0.00–0.50)

## 2019-02-17 LAB — GLUCOSE, CAPILLARY
Glucose-Capillary: 152 mg/dL — ABNORMAL HIGH (ref 70–99)
Glucose-Capillary: 156 mg/dL — ABNORMAL HIGH (ref 70–99)
Glucose-Capillary: 222 mg/dL — ABNORMAL HIGH (ref 70–99)
Glucose-Capillary: 254 mg/dL — ABNORMAL HIGH (ref 70–99)

## 2019-02-17 LAB — FERRITIN: Ferritin: 969 ng/mL — ABNORMAL HIGH (ref 11–307)

## 2019-02-17 LAB — PROCALCITONIN: Procalcitonin: 0.85 ng/mL

## 2019-02-17 LAB — C-REACTIVE PROTEIN: CRP: 8.5 mg/dL — ABNORMAL HIGH (ref <1.0)

## 2019-02-17 LAB — CREATININE, URINE, RANDOM: Creatinine, Urine: 71.08 mg/dL

## 2019-02-17 MED ORDER — CEPASTAT 14.5 MG MT LOZG
1.0000 | LOZENGE | OROMUCOSAL | Status: DC | PRN
  Filled 2019-02-17: qty 9

## 2019-02-17 MED ORDER — CEPASTAT 14.5 MG MT LOZG: 1.0000 | LOZENGE | OROMUCOSAL | Status: DC | PRN

## 2019-02-17 MED ORDER — MENTHOL 3 MG MT LOZG
1.0000 | LOZENGE | OROMUCOSAL | Status: DC | PRN
  Filled 2019-02-17: qty 9

## 2019-02-17 MED ORDER — ALBUMIN HUMAN 25 % IV SOLN
25.0000 g | Freq: Four times a day (QID) | INTRAVENOUS | Status: DC
  Administered 2019-02-17: 15:00:00 25 g via INTRAVENOUS
  Filled 2019-02-17 (×3): qty 50

## 2019-02-17 NOTE — Progress Notes (Signed)
Hospitalist progress note   Patient from home, Patient going likely home after discharge, Dispo pending at this time improvement of renal function in addition to hydration status and work-up of diarrhea  [name] [id number] DOB: [birth year] DOA: 02/14/2019  PCP: Jonathon Jordan, MD   Narrative:  79 year old JEHOVAH's WITNESS, CKD 4, hypothyroid and is status post thyroidectomy 1990-follows with Dr.Kerr, rheumatoid arthritis HTN, upper extremity DVT 2017, CVA 2018, mild aortic stenosis, DM TY 2, prior hepatic hypodensity 2/5 through 02/12/2018 symptomatic bradycardia, near syncope--- at the hospital stay evaluated and found to have worsening aortic stenosis Patient has been evaluated in the past by EP did not feel she needed a pacemaker at that time also had a CVA 02/2016   Data Reviewed:  BUN/creatinine baseline 50/4-->65/3.5-->74/3.7, bicarb 21-->17 Potassium 5.1 AST/ALT 63/34-->33/26 Iron 18 TIBC 140 ferritin 6: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  Diarrheal disease? Diverticular disease vs Covid related diarrhea Nursing reports only 1 putting consistency stool this morning and no recurrence Hold on tracking C. difficile unless she has 3 stools in 24 hours continue empiric ceftriaxone and Flagyl --stop after 5 days and re-eval Jehovah's Witness Monitor trends-continue iron supplementation may consider IV iron although ferritin is a  contraindication AKI-new, baseline between 1.4 and 1.6 creatinine Suspect 2/2 ATN from diarrheal illness preceding admit Renal US shows medical renal disease UA shows >300 protein and mild hemoglobin She is becoming mildly hyperkalemic I will give a dose of Albumin today and continue IVF at 75 cc/h Will get FeNa--she is passing urine and bladder scan from this am per RN wasn't significant Rheumatoid arthritis Follows with Dr. Arlean Hopping Continue for now  at this time I am holding leflunomide 10 daily abatacept 750 q. 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 Check outpatient TSH Hepatic hypodensity Most recent ultrasound performed of kidneys did not show anything specific on liver May need outpatient characterization of the same  Called and discussed with daughter [phone] and updated completely and called  Subjective:  Overall doing fair no distress no fever no chills ambulating around room complains of some pain in arms and legs from arthritis 1 semi formed stool today Passing urine independently and no retention  Consultants:   None at this time  Objective: Vitals:   02/16/19 2000 02/16/19 2300 02/17/19 0300 02/17/19 0736  BP: (!) 164/62 (!) 147/46 (!) 145/53 (!) 149/44  Pulse: 62 (!) 58 (!) 53 (!) 53  Resp: 20 16 18  20  Temp: 98 F (36.7 C) 98 F (36.7 C) 97.8 F (36.6 C) 98.6 F (37 C)  TempSrc: Axillary Oral Oral Oral  SpO2: 96% 97% 96% 95%  Weight:      Height:         Intake/Output Summary (Last 24 hours) at 02/17/2019 0757 Last data filed at 02/16/2019 1800 Gross per 24 hour  Intake 632.47 ml  Output 300 ml  Net 332.47 ml   Filed Weights   02/14/19 2307 02/14/19 2349  Weight: 81.6 kg 81.6 kg    Examination:  Awake coherent alert in nad no focal deficit abd soft nt dn no rebound no guard cta b no added sound Neuro intact no focal deficit Minimal LE edema ROM intact no focal deficit  Scheduled Meds: . allopurinol  300 mg Oral Daily  . aspirin  81 mg Oral Daily  . atorvastatin  80 mg Oral q1800  . darbepoetin (ARANESP) injection - NON-DIALYSIS  40 mcg Subcutaneous Q Wed-1800  . dexamethasone (DECADRON) injection  6 mg Intravenous Q24H  . diclofenac Sodium  1 application Topical QID  . famotidine  20 mg Oral Daily  . heparin injection (subcutaneous)  7,500 Units Subcutaneous Q8H  . insulin aspart  0-9 Units Subcutaneous Q4H  . sodium chloride flush  3 mL Intravenous Q12H   Continuous Infusions: . cefTRIAXone (ROCEPHIN)  IV 1 g (02/16/19 2156)  . dextrose 5 % and 0.45% NaCl 75 mL/hr at 02/15/19 1654  . ferumoxytol 510 mg (02/15/19 1536)  . metronidazole 500 mg (02/17/19 0314)  . remdesivir 100 mg in NS 100 mL Stopped (02/16/19 1045)     LOS: 2 days   Time spent: [place], MD [facility]  02/17/2019, 7:57 AM

## 2019-02-17 NOTE — Telephone Encounter (Signed)
Patient's daughter [name] called stating her mom was admitted to [facility] for [place] on 02/14/19.  [name] states they are only giving her Tylenol and her hands are swollen and her joints are painful.  [name] states she also missed her last Orencia infusion on 02/09/19 due to being sick.  [name] states she is also receiving physical therapy for her leg pain and is requesting a return call to see if there is anything Dr. Estanislado Pandy would recommend.

## 2019-02-17 NOTE — Telephone Encounter (Signed)
I cannot prescribe any medications while she is in the hospital.  Although if she is having joint swelling, patient's daughter may discuss with the attending physician if patient  can be given low-dose prednisone to decrease joint swelling which she can handle being a diabetic.

## 2019-02-17 NOTE — Telephone Encounter (Signed)
[name] advised Dr. Estanislado Pandy cannot prescribe any medications while she is in the hospital.  Although if she is having joint swelling, patient's daughter may discuss with the attending physician if patient  can be given low-dose prednisone to decrease joint swelling which she can handle being a diabetic.

## 2019-02-17 NOTE — Progress Notes (Signed)
Physical Therapy Evaluation Patient Details Name: [name] MRN: [id number] DOB: 1940/02/20 Today's Date: 02/17/2019   History of Present Illness  [name] is a 79 y.o. BF Jehovah's Witness PMHx hypothyroidism, CKD stage III, rheumatoid arthritis, DM type II controlled , chronic anemia, chronic pain, and aortic stenosis, removal intracranial meneigioma. Presenting to the emergency department 02/14/19  for evaluation of lethargy, nausea, vomiting, diarrhea, left lower quadrant abdominal pain, and loss of appetite.  Family called EMS due to patient being essentially bedbound and increasingly lethargic for the past few days.  Household contacts have had COVID-19 recently. Patient positive for Covid virus.  Clinical Impression  The patient is more mobile and takes less time to achieve the goals of activity this visit. Patient ambulate x 19' with 4 wheeled RW x 45', albeit slowly. Patient reports no dizziness.   Spoke with [name], daughter/primary contact who reports patient will return home with 24/7 caregivers. Daughter reports that patient had "bad" days and would require more assistance with ADL's. Continue PT for increased mobility. Recommend to wait on rollator to be assessed by HHPT.    Follow Up Recommendations Home health PT;Supervision/Assistance - 24 hour    Equipment Recommendations  (pt, requests Rollator, rec. to wait until returns home for it's use inher home.)    Recommendations for Other Services       Precautions / Restrictions Precautions Precautions: Fall      Mobility  Bed Mobility Overal bed mobility: Needs Assistance Bed Mobility: Supine to Sit     Supine to sit: Supervision     General bed mobility comments: Increased time needed to complete. did not assist patient  Transfers Overall transfer level: Needs assistance Equipment used: 4-wheeled walker Transfers: Sit to/from Stand Sit to Stand: Min assist;From elevated surface          General transfer comment: Min assist for balance/ steady upon standing. Appears antalgic on feet. cues to reach to recliner  Ambulation/Gait Ambulation/Gait assistance: Min assist Gait Distance (Feet): 45 Feet Assistive device: 4-wheeled walker Gait Pattern/deviations: Step-through pattern Gait velocity: decr   General Gait Details: slow gait and min steady assist with 4 wheeled Rw, multiple cues for use of breaks for sit/stand braking prior to sitting.  Stairs            Wheelchair Mobility    Modified Rankin (Stroke Patients Only)       Balance   Sitting-balance support: Bilateral upper extremity supported;Feet supported Sitting balance-Leahy Scale: Fair     Standing balance support: Bilateral upper extremity supported;During functional activity Standing balance-Leahy Scale: Fair Standing balance comment: reliant on UE                             Pertinent Vitals/Pain Faces Pain Scale: Hurts little more Pain Location: All joints Pain Descriptors / Indicators: Aching Pain Intervention(s): Monitored during session    Home Living                        Prior Function                 Hand Dominance        Extremity/Trunk Assessment                Communication      Cognition Arousal/Alertness: Awake/alert Behavior During Therapy: Flat affect Overall Cognitive Status: Impaired/Different from baseline Area of Impairment: Attention;Following commands;Awareness  Current Attention Level: Sustained Memory: Decreased short-term memory Following Commands: Follows one step commands with increased time;Follows one step commands inconsistently   Awareness: Emergent   General Comments: Pt requires increased time to process questions and respond.Does demonstrte increased processing, requires less time for activity      General Comments      Exercises     Assessment/Plan    PT Assessment    PT  Problem List         PT Treatment Interventions      PT Goals (Current goals can be found in the Care Plan section)       Frequency Min 3X/week   Barriers to discharge        Co-evaluation               AM-PAC PT "6 Clicks" Mobility  Outcome Measure Help needed turning from your back to your side while in a flat bed without using bedrails?: None Help needed moving from lying on your back to sitting on the side of a flat bed without using bedrails?: A Little Help needed moving to and from a bed to a chair (including a wheelchair)?: A Little Help needed standing up from a chair using your arms (e.g., wheelchair or bedside chair)?: A Little Help needed to walk in hospital room?: A Little Help needed climbing 3-5 steps with a railing? : A Lot 6 Click Score: 18    End of Session Equipment Utilized During Treatment: Gait belt Activity Tolerance: Patient tolerated treatment well Patient left: in chair;with call bell/phone within reach;with chair alarm set Nurse Communication: Mobility status PT Visit Diagnosis: Unsteadiness on feet (R26.81);Difficulty in walking, not elsewhere classified (R26.2)    Time: 0071-2197 PT Time Calculation (min) (ACUTE ONLY): 40 min   Charges:     PT Treatments $Gait Training: 23-37 mins $Self Care/Home Management: [place]  Office [phone]   Claretha Cooper 02/17/2019, 1:10 PM

## 2019-02-18 ENCOUNTER — Encounter (HOSPITAL_COMMUNITY): Payer: Self-pay | Admitting: Internal Medicine

## 2019-02-18 LAB — COMPREHENSIVE METABOLIC PANEL
ALT: 24 U/L (ref 0–44)
AST: 31 U/L (ref 15–41)
Albumin: 2.8 g/dL — ABNORMAL LOW (ref 3.5–5.0)
Alkaline Phosphatase: 99 U/L (ref 38–126)
Anion gap: 13 (ref 5–15)
BUN: 80 mg/dL — ABNORMAL HIGH (ref 8–23)
CO2: 18 mmol/L — ABNORMAL LOW (ref 22–32)
Calcium: 8.6 mg/dL — ABNORMAL LOW (ref 8.9–10.3)
Chloride: 109 mmol/L (ref 98–111)
Creatinine, Ser: 4.08 mg/dL — ABNORMAL HIGH (ref 0.44–1.00)
GFR calc Af Amer: 11 mL/min — ABNORMAL LOW (ref >60)
GFR calc non Af Amer: 10 mL/min — ABNORMAL LOW (ref >60)
Glucose, Bld: 179 mg/dL — ABNORMAL HIGH (ref 70–99)
Potassium: 5 mmol/L (ref 3.5–5.1)
Sodium: 140 mmol/L (ref 135–145)
Total Bilirubin: 0.8 mg/dL (ref 0.3–1.2)
Total Protein: 6.2 g/dL — ABNORMAL LOW (ref 6.5–8.1)

## 2019-02-18 LAB — FERRITIN: Ferritin: 1596 ng/mL — ABNORMAL HIGH (ref 11–307)

## 2019-02-18 LAB — CBC WITH DIFFERENTIAL/PLATELET
Abs Immature Granulocytes: 0.19 10*3/uL — ABNORMAL HIGH (ref 0.00–0.07)
Basophils Absolute: 0 10*3/uL (ref 0.0–0.1)
Basophils Relative: 0 %
Eosinophils Absolute: 0 10*3/uL (ref 0.0–0.5)
Eosinophils Relative: 0 %
HCT: 28.3 % — ABNORMAL LOW (ref 36.0–46.0)
Hemoglobin: 9.1 g/dL — ABNORMAL LOW (ref 12.0–15.0)
Immature Granulocytes: 3 %
Lymphocytes Relative: 23 %
Lymphs Abs: 1.6 10*3/uL (ref 0.7–4.0)
MCH: 28.5 pg (ref 26.0–34.0)
MCHC: 32.2 g/dL (ref 30.0–36.0)
MCV: 88.7 fL (ref 80.0–100.0)
Monocytes Absolute: 0.5 10*3/uL (ref 0.1–1.0)
Monocytes Relative: 7 %
Neutro Abs: 4.5 10*3/uL (ref 1.7–7.7)
Neutrophils Relative %: 67 %
Platelets: 227 10*3/uL (ref 150–400)
RBC: 3.19 MIL/uL — ABNORMAL LOW (ref 3.87–5.11)
RDW: 15.6 % — ABNORMAL HIGH (ref 11.5–15.5)
WBC: 6.7 10*3/uL (ref 4.0–10.5)
nRBC: 0 % (ref 0.0–0.2)

## 2019-02-18 LAB — GLUCOSE, CAPILLARY
Glucose-Capillary: 213 mg/dL — ABNORMAL HIGH (ref 70–99)
Glucose-Capillary: 167 mg/dL — ABNORMAL HIGH (ref 70–99)
Glucose-Capillary: 148 mg/dL — ABNORMAL HIGH (ref 70–99)
Glucose-Capillary: 166 mg/dL — ABNORMAL HIGH (ref 70–99)
Glucose-Capillary: 184 mg/dL — ABNORMAL HIGH (ref 70–99)
Glucose-Capillary: 259 mg/dL — ABNORMAL HIGH (ref 70–99)

## 2019-02-18 LAB — PROTEIN / CREATININE RATIO, URINE
Creatinine, Urine: 73.11 mg/dL
Total Protein, Urine: <6 mg/dL

## 2019-02-18 LAB — C-REACTIVE PROTEIN: CRP: 6.6 mg/dL — ABNORMAL HIGH (ref <1.0)

## 2019-02-18 LAB — D-DIMER, QUANTITATIVE: D-Dimer, Quant: 7 ug/mL-FEU — ABNORMAL HIGH (ref 0.00–0.50)

## 2019-02-18 MED ORDER — SODIUM BICARBONATE 8.4 % IV SOLN
INTRAVENOUS | Status: DC
  Filled 2019-02-18 (×9): qty 100

## 2019-02-18 MED ORDER — SODIUM CHLORIDE 0.9% FLUSH: 10.0000 mL | INTRAVENOUS | Status: DC | PRN

## 2019-02-18 MED ORDER — SODIUM BICARBONATE 8.4 % IV SOLN: INTRAVENOUS | Status: DC

## 2019-02-18 NOTE — Progress Notes (Addendum)
I was passing by patients room when I observed her sitting up in bed as if she is about to get up.  I entered room and patient stated she had to go to the bathroom. (Bed alarm was turned on but patient had not yet placed her leg over the side so it had not yet alarmed.)   I asked her if she needed to have a bowel movement and she replied, "No I gotta pee."  I showed her the catheter tubing and reminded her that she had a catheter.  She became hostile and replied, "I been going to the bathroom for 80 years and I know when I gotta go." Patient proceeded to try to get up and began pushing my arms away as I attempted to assist her.  I pulled bedside commode next to her bed so that she could just pivot onto commode.  When I attempted to pull down her pajama pants for her, she slapped me on the hands and told me she had it.  I stood there surrounding patient in case she stumbled.  Patient sat on commode about 5 minutes until she realized she could not urinate.  She wanted to stay sitting on commode and I informed her that she was a fall risk so I cannot leave her unattended. She got up pushing me away and not allowing me to help with her clothing.  Patient was very unsteady.  Patient returned to bed and I made sure that all of the fall prevention measures were secured prior to my leaving the room.

## 2019-02-18 NOTE — Plan of Care (Signed)
  Problem: Education: Goal: Knowledge of risk factors and measures for prevention of condition will improve Outcome: Progressing   Problem: Coping: Goal: Psychosocial and spiritual needs will be supported Outcome: Progressing   Problem: Respiratory: Goal: Will maintain a patent airway Outcome: Progressing Goal: Complications related to the disease process, condition or treatment will be avoided or minimized Outcome: Progressing   Problem: Metabolic: Goal: Ability to maintain appropriate glucose levels will improve Outcome: Progressing

## 2019-02-18 NOTE — TOC Progression Note (Signed)
Transition of Care [facility]) - Progression Note    Patient Details  Name: [name] MRN: [id number] Date of Birth: [birth year]  [facility] CM/SW Contact  Loletha Grayer Beverely Pace, RN Phone Number: 02/18/2019, 4:36 PM  Clinical Narrative:  Case manager spoke with patient via telephone to discuss need for Home Health services at discharge. Discussed choice with her, she has no preference. Case manager called referral to [facility] Liaison. Patient says that she will need a wide rolling walker. Her daughter will assist her at discharge. Gilford Rile will be requested from [place]. TOC Team will continue to monitor.   Expected Discharge Plan: [place] Barriers to Discharge: Continued Medical Work up  Expected Discharge Plan and Services Expected Discharge Plan: [place]   Discharge Planning Services: CM Consult Post Acute Care Choice: Home Health, Durable Medical Equipment Living arrangements for the past 2 months: Single Family Home                 DME Arranged: Environmental consultant wide         HH Arranged: PT, OT HH Agency: [place] Date [place]: 02/18/19 Time [place]: [place] Representative spoke with at [place]: [place] ([place]) Interventions    Readmission Risk Interventions No flowsheet data found.

## 2019-02-18 NOTE — Progress Notes (Addendum)
Occupational Therapy Treatment Patient Details Name: [name] MRN: [id number] DOB: [birth year] Today's Date: 02/18/2019    History of present illness [name] is a 79 y.o. BF Jehovah's Witness PMHx hypothyroidism, CKD stage III, rheumatoid arthritis, DM type II controlled , chronic anemia, chronic pain, and aortic stenosis, removal intracranial meneigioma. Presenting to the emergency department 02/14/19  for evaluation of lethargy, nausea, vomiting, diarrhea, left lower quadrant abdominal pain, and loss of appetite.  Family called EMS due to patient being essentially bedbound and increasingly lethargic for the past few days.  Household contacts have had COVID-19 recently. Patient positive for Covid virus.   OT comments  Patient in bed talking on phone on arrival.  She was very distracted during session by family members calling and needed max redirection to stay on task.  Patient required min assist with bed mobility and mod assist to stand.  Patient had trouble following instructions and was hesitate to stand.  Completed transfer to chair with RW and min assist.  Patient remained on room air throughout session and SpO2> 96.  Will continue to follow with OT acutely to address the deficits listed below.     Follow Up Recommendations  Home health OT;Supervision/Assistance - 24 hour    Equipment Recommendations  None recommended by OT    Recommendations for Other Services      Precautions / Restrictions Precautions Precautions: Fall Restrictions Weight Bearing Restrictions: No       Mobility Bed Mobility Overal bed mobility: Needs Assistance Bed Mobility: Supine to Sit     Supine to sit: Min assist        Transfers Overall transfer level: Needs assistance Equipment used: Rolling walker (2 wheeled) Transfers: Sit to/from [facility] Sit to Stand: Mod assist Stand pivot transfers: Min assist       General transfer comment: Very hesitate to  stand up, mod assist potentially due to confusion about how to stand up    Balance Overall balance assessment: Needs assistance Sitting-balance support: Bilateral upper extremity supported;Feet supported Sitting balance-Leahy Scale: Fair Sitting balance - Comments: Patient would occasionally lose balance and start leaning back at EOB, needing min assist to regain balance   Standing balance support: Bilateral upper extremity supported;During functional activity Standing balance-Leahy Scale: Poor Standing balance comment: reliant on UE                           ADL either performed or assessed with clinical judgement   ADL Overall ADL's : Needs assistance/impaired Eating/Feeding: Set up;Sitting   Grooming: Set up;Supervision/safety;Sitting                               Functional mobility during ADLs: Minimal assistance;Rolling walker General ADL Comments: Requires verbal and tactile cues to attend to task     Vision       Perception     Praxis      Cognition Arousal/Alertness: Awake/alert Behavior During Therapy: Flat affect Overall Cognitive Status: No family/caregiver present to determine baseline cognitive functioning Area of Impairment: Attention;Following commands;Awareness                   Current Attention Level: Sustained Memory: Decreased short-term memory Following Commands: Follows one step commands with increased time;Follows one step commands inconsistently   Awareness: Emergent   General Comments: Pt requires increased time to process questions and respond.Does demonstrte increased  processing, requires less time for activity        Exercises     Shoulder Instructions       General Comments      Pertinent Vitals/ Pain       Pain Assessment: No/denies pain  Home Living                                          Prior Functioning/Environment              Frequency  Min 3X/week         Progress Toward Goals  OT Goals(current goals can now be found in the care plan section)  Progress towards OT goals: Progressing toward goals  Acute Rehab OT Goals Patient Stated Goal: to go home Time For Goal Achievement: 03/02/19 Potential to Achieve Goals: Good  Plan Discharge plan needs to be updated    Co-evaluation                 AM-PAC OT "6 Clicks" Daily Activity     Outcome Measure   Help from another person eating meals?: A Little Help from another person taking care of personal grooming?: A Little Help from another person toileting, which includes using toliet, bedpan, or urinal?: A Lot Help from another person bathing (including washing, rinsing, drying)?: A Lot Help from another person to put on and taking off regular upper body clothing?: A Little Help from another person to put on and taking off regular lower body clothing?: A Lot 6 Click Score: 15    End of Session Equipment Utilized During Treatment: Gait belt;Rolling walker  OT Visit Diagnosis: Unsteadiness on feet (R26.81);Muscle weakness (generalized) (M62.81)   Activity Tolerance Patient tolerated treatment well   Patient Left in chair;with call bell/phone within reach;with chair alarm set   Nurse Communication Mobility status        Time: 2778-2423 OT Time Calculation (min): 35 min  Charges: OT General Charges $OT Visit: 1 Visit OT Treatments $Self Care/Home Management : 23-37 mins  August Luz, OTR/L    Phylliss Bob 02/18/2019, 1:56 PM

## 2019-02-18 NOTE — Plan of Care (Signed)
Maintaining saturation on room air Problem: Respiratory: Goal: Will maintain a patent airway Outcome: Progressing

## 2019-02-18 NOTE — Progress Notes (Signed)
PT Cancellation Note  Patient Details Name: [name] MRN: [id number] DOB: 03-30-1940   Cancelled Treatment:    Reason Eval/Treat Not Completed: Patient declined, no reason specified;Other (comment)(Pt states already did OT and does not wish to do both in sam)   Pt requests to perform PT and OT on alternating days as she is too tired, and wants to rest more today.    Ann Held PT, DPT Acute Rehab [place] P: [place] 02/18/2019, 2:02 PM

## 2019-02-18 NOTE — Consult Note (Addendum)
Renal Service Consult Note [facility]  [name] 02/18/2019 [place] Requesting Physician:  Dr Verlon Au  Reason for Consult:  Renal failure HPI: The patient is a 79 y.o. year-old w/ hx of TB, sickle trait, RA, OA, HL, HTN, HL, DVT, DM2, CKD III, CHF, chronic pain, AS admitted on 02/14/19 for AMS w/ n/v/d, abd pain, fatigue and dec'd mobility x 1 wk.  Was bedbound all the week prior to presentations. Was nonverbal w/ EMS. + she did have household COVID exposures.  In ED pt was febrile, CXR negative, WBC 11k, Hb 7.0, creat 4.20.  Asked to see for renal failure.   Total I/O here are 3L in and 2.4 L out.  BP's normal to slightly high  Pt seen in room, no sig SOB or CP.  No abd pain.  Foley in place.  Pt is Pleasant and vague historian though.       Date  Creat  eGFR  2008- 10 0.8- 1.0  2011- 14 1.0- 1.5  2015- 17 1.1- 1.4  2018  1.35- 1.63  2019  1.33- 1.69  2020  1.44- 1.61 33- 40 , stage III  02/15/19  4.14  2/10  3.52  2/11  3.72  2/12/ 21 4.08   ROS  denies CP  no joint pain   no HA  no blurry vision  no rash  no diarrhea  no nausea/ vomiting   Past Medical History  Past Medical History:  Diagnosis Date  . Allergic rhinitis   . Anemia   . Aortic stenosis 12/22/2012   Mild 3/14-2.35 m/s peak velocity  . Cataract 2018  . CHF (congestive heart failure) ([place])   . Chronic pain   . CKD (chronic kidney disease), stage III   . Diabetes mellitus   . Diverticulosis   . DVT (deep venous thrombosis) ([place])   . Glaucoma   . Hemorrhoids    internal  . HTN (hypertension)   . Hypercholesteremia   . Hyperlipidemia 12/22/2012  . Hypothyroidism   . Meningioma ([place]) 2008  . Osteoarthritis   . Peripheral neuropathy   . PPD positive    6 months ago  . PVC (premature ventricular contraction) 12/22/2012  . Rheumatoid arthritis(714.0)   . Sickle cell trait ([place])   . Tuberculosis    history of positive TB testing has yearly chest xrays in May /  completed INH   Past Surgical History  Past Surgical History:  Procedure Laterality Date  . ABDOMINAL HYSTERECTOMY  1978   TAH,& BSO 1  . CATARACT EXTRACTION Bilateral   . EYE SURGERY    . fistula repair  03/2010   partial colectomy  . intracranial surgery  11/2007   removal of meningioma  . JOINT REPLACEMENT  2001   left TKR  . LEFT HEART CATHETERIZATION WITH CORONARY ANGIOGRAM N/A 08/08/2013   Procedure: LEFT HEART CATHETERIZATION WITH CORONARY ANGIOGRAM;  Surgeon: Peter M Martinique, MD;  Location[facility] C[facility] LAB;  Service: Cardiovascular;  Laterality: N/A;  . ROTATOR CUFF REPAIR  1999   BIlateral  . SIGMOIDECTOMY  2012  . TOE SURGERY Left 2018  . TONSILLECTOMY AND ADENOIDECTOMY    . TOTAL THYROIDECTOMY  1992  . wrist sx     right    Family History  Family History  Problem Relation Age of Onset  . Hypotension Mother   . CVA Mother   . Diabetes Mother   . Hypertension Father   . Kidney failure Father  Social History  reports that she quit smoking about 57 years ago. Her smoking use included cigarettes. She has a 0.20 pack-year smoking history. She has never used smokeless tobacco. She reports current alcohol use. She reports that she does not use drugs. Allergies  Allergies  Allergen Reactions  . Methotrexate Derivatives Other (See Comments)    Elevated creat. Levels  . Actemra [Tocilizumab] Rash  . Other Other (See Comments)    NO Blood products.  [name]- causes low blood sugar (pt states she was taking medication but cant remember what it was for.)  . Penicillins Rash    Has patient had a PCN reaction causing immediate rash, facial/tongue/throat swelling, SOB or lightheadedness with hypotension: Yes Has patient had a PCN reaction causing severe rash involving mucus membranes or skin necrosis: Yes  Has patient had a PCN reaction that required hospitalization No Has patient had a PCN reaction occurring within the last 10 years: No If all of the above answers are  "NO", then may proceed with Cephalosporin use.   . Chlorhexidine   . Remicade [Infliximab] Other (See Comments)    Inadequate response   . Zocor [Simvastatin] Other (See Comments)    Leg cramps    Home medications Prior to Admission medications   Medication Sig Start Date End Date Taking? Authorizing Provider  abatacept 750 mg in sodium chloride 0.9 % 70 mL Inject 750 mg into the vein every 28 (twenty-eight) days. 09/21/18  Yes Deveshwar, Abel Presto, MD  acetaminophen (TYLENOL) 500 MG tablet Take 1,000 mg by mouth every 6 (six) hours as needed for mild pain.   Yes [provider]  allopurinol (ZYLOPRIM) 300 MG tablet Take 1 tablet (300 mg total) by mouth daily. 03/31/17 05/06/19 Yes Deveshwar, Abel Presto, MD  aspirin EC 81 MG EC tablet Take 1 tablet (81 mg total) by mouth daily. 10/22/15  Yes Dessa Phi, DO  Aspirin-Caffeine (BACK & BODY EXTRA STRENGTH) 500-32.5 MG TABS Take 1 tablet by mouth daily as needed (pain).   Yes [provider]  atorvastatin (LIPITOR) 80 MG tablet Take 80 mg by mouth daily.  06/22/16  Yes [provider]  Calcium Carb-Cholecalciferol (CALCIUM 600-D PO) Take 1 tablet by mouth daily.   Yes [provider]  Cholecalciferol (VITAMIN D3) 10 MCG (400 UNIT) tablet Take 400 Units by mouth daily.   Yes [provider]  clotrimazole-betamethasone (LOTRISONE) cream Apply 1 application topically daily as needed (rash/irritation).  05/29/16  Yes [provider]  cyclobenzaprine (FLEXERIL) 10 MG tablet Take 10 mg by mouth 3 (three) times daily as needed for muscle spasms.    Yes [provider]  diclofenac Sodium (VOLTAREN) 1 % GEL Apply 1 application topically 2 (two) times daily as needed (leg pain).   Yes [provider]  ferrous sulfate (FER-IN-SOL) 75 (15 Fe) MG/ML SOLN Take 45 mg of iron by mouth daily.   Yes [provider]  HYDROcodone-acetaminophen (NORCO) 5-325 MG tablet Take 1 tablet by mouth  every 6 (six) hours as needed for moderate pain. Patient taking differently: Take 1-2 tablets by mouth every 6 (six) hours as needed for moderate pain.  07/22/16  Yes Stover, Titorya, DPM  Insulin Isophane & Regular Human (NOVOLIN 70/30 FLEXPEN RELION) (70-30) 100 UNIT/ML PEN Inject 6-8 Units into the skin 3 (three) times daily as needed (CBG >140).   Yes [provider]  leflunomide (ARAVA) 10 MG tablet Take 1 tablet (10 mg total) by mouth daily. 12/16/18  Yes Bo Merino, MD  levothyroxine (SYNTHROID, LEVOTHROID) 200 MCG tablet Take 1 tablet (200 mcg total) by mouth daily at 6 (six) AM. Patient taking differently: Take 200 mcg by mouth daily before breakfast.  02/13/18  Yes Mariel Aloe, MD  losartan (COZAAR) 50 MG tablet Take 50 mg by mouth daily. 08/03/18  Yes [provider]  pregabalin (LYRICA) 75 MG capsule 1 bid Patient taking differently: Take 75 mg by mouth 2 (two) times daily.  09/30/16  Yes Deveshwar, Abel Presto, MD  vitamin B-12 (CYANOCOBALAMIN) 50 MCG tablet Take 50 mcg by mouth daily.   Yes [provider]  BD INSULIN SYRINGE ULTRAFINE 31G X 15/64" 0.3 ML MISC  11/02/15   [provider]     Vitals:   02/18/19 0000 02/18/19 0400 02/18/19 0731 02/18/19 1139  BP: 136/88 140/74 (!) 156/77 (!) 159/52  Pulse: 60 60 (!) 59 64  Resp: 20 20 20    Temp: 98.1 F (36.7 C) 98.9 F (37.2 C) 98 F (36.7 C) 97.7 F (36.5 C)  TempSrc: Oral Oral Oral Oral  SpO2: 100% 100% 99% 100%  Weight:      Height:       Exam Gen alert, no distress, calm and pleasant No rash, cyanosis or gangrene Sclera anicteric, throat clear and moist  No jvd or bruits Chest clear bilat to bases no rales or wheezing RRR no MRG Abd soft ntnd no mass or ascites +bs mild obesity GU foley cath draining light yellowish clear urine MS no joint effusions or deformity Ext bilat appears chronic 1+ edema, no wounds or ulcers Neuro is alert, Ox 3 , nf    Home meds:  - aspirin  81/ atrovastatin 80 qd  - losartan 50 qd  - insulin 70/30 6-8 u tid ac  - pregabalin 75 bid/ hydrocodone qid prn/ aspirin-caffeine tabs qd prn  - allopurinol 300 hd/ synthroid 200 ug  - abatacept 750 mg IV q 4 weeks/ leflunomide 100m qd     UA 2/8 - >300 protein, 0-5 rbc/ wbc, 5.0, 1.014, cloudy   UNa 39  UCr 71      BP's normal to high 130 - 150's /  47- 77    Renal [place]> IMPRESSION:  8.7cm and 10.4 cm Increased echogenicity of renal parenchyma is noted bilaterally consistent with medical renal disease. No hydronephrosis or renal obstruction is noted.     CXR - no acute disease   Assessment/ Plan: 1. AoCKD 3 - baseline creat 1.4- 1.7 from 11/2018 .  Here creat 3.5- 4.0.  Suspect AKI due to vol depletion + ARB prior to admit. Creat stable not rising. Renal [place] no hydro, echogenic kidneys. UA proteinuria.  Has room for volume on exam, good UOP. No gross uremic findings at this time, no indication for RRT.  Cont IVF's bicarb at 75/hr.  DC'd nsaid gel and PPI.  BP's are good on high side of normal. Quantitate proteinuria. Will follow.  2. COVID + PNA - on IV decadron and remdesivir, + IV abx 3. Diarrhea - poss d/t #2, per primary 4. Anemia ckd - Hb 8-10 range, getting darbe here 40 ug SQ weekly. Pt is jFara Boroswitness 5. DM on insulin  6. HTN - bp's okay, no need for BP meds at this time. Home arb on hold 7. H/o aortic stenosis    8. H/o RA      RKelly Splinter MD 02/18/2019, 2:26 PM  Recent Labs  Lab 02/17/19 0234 02/18/19 0050  WBC 3.8*  6.7  HGB 8.3* 9.1*   Recent Labs  Lab 02/15/19 0123 02/15/19 0239 02/15/19 0901 02/15/19 0901 02/15/19 1128 02/16/19 0241 02/16/19 0241 02/17/19 0234 02/18/19 0050  K 5.2*   < > 4.6   < >  --  4.7   < > 5.1 5.0  BUN 57*   < > 57*   < >  --  65*   < > 74* 80*  CREATININE 4.14*   < > 4.02*   < >  --  3.52*   < > 3.72* 4.08*  CALCIUM 7.5*  --  7.5*  --   --  7.9*  --  8.2* 8.6*  PHOS  --   --   --   --  4.1 3.5  --   --   --    < > =  values in this interval not displayed.

## 2019-02-18 NOTE — Progress Notes (Signed)
Hospitalist progress note   Patient from home, Patient going likely home after discharge, Dispo pending at this time improvement of renal function in addition to hydration status and work-up of diarrhea  [name] [id number] DOB: 10/10/1940 DOA: 02/14/2019  PCP: Jonathon Jordan, MD   Narrative:  79 year old JEHOVAH's WITNESS, CKD 4, hypothyroid and is status post thyroidectomy 1990-follows with Dr.Kerr, rheumatoid arthritis HTN, upper extremity DVT 2017, CVA 2018, mild aortic stenosis, DM TY 2, prior hepatic hypodensity 2/5 through 02/12/2018 symptomatic bradycardia, near syncope--- at the hospital stay evaluated and found to have worsening aortic stenosis Patient has been evaluated in the past by EP did not feel she needed a pacemaker at that time also had a CVA 02/2016 Admit from home 2/8 with significan abd pain multyiple loose BM and found to have Covid Also AKI on admit creat up from basline 1.6--->4   Data Reviewed:  BUN/creatinine baseline 50/4-->65/3.5-->74/3.7-->80/4.08, bicarb 21-->16 Potassium 5.0 AST/ALT 63/34-->33/26 Iron 18 TIBC 140 ferritin 6:12 (A OCD) Hemoglobin baseline usually is 10 range on admission 7.0 currently 9.1  COVID-19 Labs  Recent Labs    02/16/19 0241 02/17/19 0234 02/18/19 0050  DDIMER 12.53* 7.52* 7.00*  FERRITIN 568* 969* 1,596*  CRP 11.4* 8.5* 6.6*    No results found for: [place]:  Coronavirus 19 infection Dimer was elevated prompting lower extremity Dopplers which are negative for DVT 2/10 Continue remdesivir/Decadron 2/9--2/13 Not candidate for Actemra and certainly does not meet criteria right now ?  Diarrheal disease? Diverticular disease vs Covid related diarrhea Nursing reports only 1 putting consistency stool this morning and no recurrence Hold on tracking C. difficile unless she has 3 stools in 24 hours continue empiric ceftriaxone and Flagyl --stop 2/13 and re-eval Jehovah's Witness Monitor  trends-continue iron supplementation may consider IV iron although ferritin is a contraindication AKI-new, baseline between 1.4 and 1.6 creatinine Suspect 2/2 ATN from diarrheal illness preceding admit Renal US shows medical renal disease UA shows >300 protein and mild hemoglobin Given 2/11 Albumin and continue IVF at 75 cc/h FeNa intrinsic renal Neprho input appreciated Rheumatoid arthritis Follows with Dr. Arlean Hopping at this time I am holding leflunomide 10 daily abatacept 750 q. 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 Check outpatient TSH Hepatic hypodensity Most recent ultrasound performed of kidneys did not show anything specific on liver May need outpatient characterization of the same  Called and discussed with daughter [phone] and updated 02/18/19 and answered all questions  Subjective:  Fair doesn't recall meeting nephrologist Some joint pains some abd pain No stool today no sob in bed no new issues per RN  Consultants:   None at this time  Objective: Vitals:   02/18/19 0000 02/18/19 0400 02/18/19 0731 02/18/19 1139  BP: 136/88 140/74 (!) 156/77 (!) 159/52  Pulse: 60 60 (!) 59 64  Resp: 20 20 20    Temp: 98.1 F (36.7 C) 98.9 F (37.2 C) 98 F (36.7 C) 97.7 F (36.5 C)  TempSrc: Oral Oral Oral Oral  SpO2:  100% 100% 99% 100%  Weight:      Height:        Intake/Output Summary (Last 24 hours) at 02/18/2019 1628 Last data filed at 02/18/2019 1318 Gross per 24 hour  Intake 2039 ml   Output 1350 ml  Net 689 ml   Filed Weights   02/14/19 2307 02/14/19 2349  Weight: 81.6 kg 81.6 kg    Examination:  No acute change from prior  Awake coherent alert in nad no focal deficit abd soft nt dn no rebound no guard cta b no added sound Neuro intact no focal deficit Minimal LE edema ROM intact no focal deficit  Scheduled Meds: . allopurinol  300 mg Oral Daily  . aspirin  81 mg Oral Daily  . atorvastatin  80 mg Oral q1800  . darbepoetin (ARANESP) injection - NON-DIALYSIS  40 mcg Subcutaneous Q Wed-1800  . dexamethasone (DECADRON) injection  6 mg Intravenous Q24H  . heparin injection (subcutaneous)  7,500 Units Subcutaneous Q8H  . insulin aspart  0-9 Units Subcutaneous Q4H  . sodium chloride flush  3 mL Intravenous Q12H   Continuous Infusions: . cefTRIAXone (ROCEPHIN)  IV 1 g (02/17/19 2146)  . ferumoxytol 510 mg (02/15/19 1536)  . metronidazole 500 mg (02/18/19 1336)  . remdesivir 100 mg in NS 100 mL 100 mg (02/18/19 0930)  .  sodium bicarbonate  infusion 1000 mL 75 mL/hr at 02/18/19 1433     LOS: 3 days   Time spent: [place], MD [facility]  02/18/2019, 4:28 PM

## 2019-02-19 LAB — COMPREHENSIVE METABOLIC PANEL
ALT: 26 U/L (ref 0–44)
AST: 44 U/L — ABNORMAL HIGH (ref 15–41)
Albumin: 2.2 g/dL — ABNORMAL LOW (ref 3.5–5.0)
Alkaline Phosphatase: 97 U/L (ref 38–126)
Anion gap: 10 (ref 5–15)
BUN: 81 mg/dL — ABNORMAL HIGH (ref 8–23)
CO2: 22 mmol/L (ref 22–32)
Calcium: 7.6 mg/dL — ABNORMAL LOW (ref 8.9–10.3)
Chloride: 103 mmol/L (ref 98–111)
Creatinine, Ser: 3.88 mg/dL — ABNORMAL HIGH (ref 0.44–1.00)
GFR calc Af Amer: 12 mL/min — ABNORMAL LOW (ref >60)
GFR calc non Af Amer: 10 mL/min — ABNORMAL LOW (ref >60)
Glucose, Bld: 471 mg/dL — ABNORMAL HIGH (ref 70–99)
Potassium: 4.5 mmol/L (ref 3.5–5.1)
Sodium: 135 mmol/L (ref 135–145)
Total Bilirubin: 0.8 mg/dL (ref 0.3–1.2)
Total Protein: 5.2 g/dL — ABNORMAL LOW (ref 6.5–8.1)

## 2019-02-19 LAB — CULTURE, BLOOD (ROUTINE X 2)
Culture: NO GROWTH
Culture: NO GROWTH
Special Requests: ADEQUATE

## 2019-02-19 LAB — CBC WITH DIFFERENTIAL/PLATELET
Abs Immature Granulocytes: 0.27 10*3/uL — ABNORMAL HIGH (ref 0.00–0.07)
Basophils Absolute: 0 10*3/uL (ref 0.0–0.1)
Basophils Relative: 0 %
Eosinophils Absolute: 0 10*3/uL (ref 0.0–0.5)
Eosinophils Relative: 0 %
HCT: 25.5 % — ABNORMAL LOW (ref 36.0–46.0)
Hemoglobin: 8.5 g/dL — ABNORMAL LOW (ref 12.0–15.0)
Immature Granulocytes: 5 %
Lymphocytes Relative: 22 %
Lymphs Abs: 1.3 10*3/uL (ref 0.7–4.0)
MCH: 28.4 pg (ref 26.0–34.0)
MCHC: 33.3 g/dL (ref 30.0–36.0)
MCV: 85.3 fL (ref 80.0–100.0)
Monocytes Absolute: 0.5 10*3/uL (ref 0.1–1.0)
Monocytes Relative: 9 %
Neutro Abs: 3.7 10*3/uL (ref 1.7–7.7)
Neutrophils Relative %: 64 %
Platelets: 217 10*3/uL (ref 150–400)
RBC: 2.99 MIL/uL — ABNORMAL LOW (ref 3.87–5.11)
RDW: 15.6 % — ABNORMAL HIGH (ref 11.5–15.5)
WBC: 5.8 10*3/uL (ref 4.0–10.5)
nRBC: 0.5 % — ABNORMAL HIGH (ref 0.0–0.2)

## 2019-02-19 LAB — GLUCOSE, CAPILLARY
Glucose-Capillary: 254 mg/dL — ABNORMAL HIGH (ref 70–99)
Glucose-Capillary: 264 mg/dL — ABNORMAL HIGH (ref 70–99)
Glucose-Capillary: 267 mg/dL — ABNORMAL HIGH (ref 70–99)
Glucose-Capillary: 270 mg/dL — ABNORMAL HIGH (ref 70–99)
Glucose-Capillary: 284 mg/dL — ABNORMAL HIGH (ref 70–99)
Glucose-Capillary: 293 mg/dL — ABNORMAL HIGH (ref 70–99)

## 2019-02-19 LAB — FERRITIN: Ferritin: 1336 ng/mL — ABNORMAL HIGH (ref 11–307)

## 2019-02-19 LAB — D-DIMER, QUANTITATIVE: D-Dimer, Quant: 3.95 ug/mL-FEU — ABNORMAL HIGH (ref 0.00–0.50)

## 2019-02-19 LAB — C-REACTIVE PROTEIN: CRP: 2.3 mg/dL — ABNORMAL HIGH (ref <1.0)

## 2019-02-19 MED ORDER — DEXAMETHASONE 6 MG PO TABS
6.0000 mg | ORAL_TABLET | Freq: Every day | ORAL | Status: DC
  Administered 2019-02-20: 6 mg via ORAL
  Filled 2019-02-19: qty 1

## 2019-02-19 MED ORDER — INSULIN GLARGINE 100 UNIT/ML ~~LOC~~ SOLN
8.0000 [IU] | Freq: Every day | SUBCUTANEOUS | Status: DC
  Administered 2019-02-20 – 2019-02-22 (×3): 8 [IU] via SUBCUTANEOUS
  Filled 2019-02-19 (×4): qty 0.08

## 2019-02-19 NOTE — Progress Notes (Signed)
Hospitalist progress note   Patient from home, Patient going likely home after discharge, Dispo pending at this time improvement of renal function in addition to hydration status and work-up of diarrhea  [name] [id number] DOB: [birth year] DOA: 02/14/2019  PCP: Jonathon Jordan, MD   Narrative:  79 year old JEHOVAH's WITNESS, CKD 4, hypothyroid and is status post thyroidectomy 1990-follows with Dr.Kerr, rheumatoid arthritis HTN, upper extremity DVT 2017, CVA 2018, mild aortic stenosis, DM TY 2, prior hepatic hypodensity 2/5 through 02/12/2018 symptomatic bradycardia, near syncope--- at the hospital stay evaluated and found to have worsening aortic stenosis Patient has been evaluated in the past by EP did not feel she needed a pacemaker at that time also had a CVA 02/2016 Admit from home 2/8 with significan abd pain multyiple loose BM and found to have Covid Also AKI on admit creat up from basline 1.6--->4   Data Reviewed:  BUN/creatinine baseline 50/4-->65/3.5-->74/3.7-->80/4.08, bicarb 21-->16 Potassium 5.0-->4.5 AST/ALT 63/34-->33/26-->44/26 Hemoglobin baseline usually is 10 range on admission 7.0 currently 9.1  COVID-19 Labs  Recent Labs    02/17/19 0234 02/18/19 0050 02/19/19 0610  DDIMER 7.52* 7.00* 3.95*  FERRITIN 969* 1,596* 1,336*  CRP 8.5* 6.6* 2.3*    No results found for: [place]:  Coronavirus 19 infection Dimer+ Dopplers which are negative for DVT 2/10 Continue remdesivir/Decadron 2/9--2/13 Not candidate for Actemra 2/2 immunosuppressants ?  Diarrheal disease? Diverticular disease vs Covid related diarrhea empiric ceftriaxone and Flagyl --stopped 2/13 --no recurrence--was likely viral assoc with Covid Jehovah's Witness Monitor trends-continue iron supplementation may consider IV iron although ferritin is a contraindication AKI-new, baseline between 1.4 and 1.6 creatinine Suspect 2/2 ATN from diarrheal illness preceding admit Renal US  shows medical renal disease UA shows >300 protein and mild hemoglobin Given 2/11 Albumin and continue IVF at 75 cc/h FeNa intrinsic renal Neprho input appreciated--stopping NSAID gel and PPI--cnotinuing today d5 + Bicarb at 75.  Will d/c bicarb in am if improvement sustains Hopeful for overall improvment Rheumatoid arthritis Follows with Dr. Arlean Hopping at this time I am holding leflunomide 10 daily abatacept 750 q. 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 Check outpatient TSH Hepatic hypodensity Most recent ultrasound performed of kidneys did not show anything specific on liver May need outpatient characterization of the same  Called and discussed with daughter [phone] and updated 02/18/19 and answered all questions  Subjective:  Doing ok No real new issues Some pain in joint no cp no fever  Consultants:   None at this time  Objective: Vitals:   02/18/19 2300 02/19/19 0400 02/19/19 0756 02/19/19 1527  BP:  (!) 152/66 (!) 168/57 (!) 158/64  Pulse:  89 (!) 52 (!) 53  Resp:   18 15  Temp: 98 F (36.7 C) 98.1 F (36.7 C) 98 F (36.7 C) 97.6 F (36.4 C)  TempSrc: Oral Oral Oral Oral  SpO2:  99% 98% 95%  Weight:      Height:  Intake/Output Summary (Last 24 hours) at 02/19/2019 1637 Last data filed at 02/19/2019 1634 Gross per 24 hour  Intake 2700.76 ml  Output 1425 ml  Net 1275.76 ml   Filed Weights   02/14/19 2307 02/14/19 2349  Weight: 81.6 kg  81.6 kg    Examination:  Slow speech but cohernet no focal deficit abd soft nt dn no rebound no guard cta b no added sound Neuro intact no focal deficit Minimal LE edema ROM intact no focal deficit  Scheduled Meds: . allopurinol  300 mg Oral Daily  . aspirin  81 mg Oral Daily  . atorvastatin  80 mg Oral q1800  . darbepoetin (ARANESP) injection - NON-DIALYSIS  40 mcg Subcutaneous Q Wed-1800  . dexamethasone (DECADRON) injection  6 mg Intravenous Q24H  . heparin injection (subcutaneous)  7,500 Units Subcutaneous Q8H  . insulin aspart  0-9 Units Subcutaneous Q4H  . sodium chloride flush  3 mL Intravenous Q12H   Continuous Infusions: . ferumoxytol 510 mg (02/15/19 1536)  .  sodium bicarbonate  infusion 1000 mL 75 mL/hr at 02/19/19 0602     LOS: 4 days   Time spent: [place], MD [facility]  02/19/2019, 4:37 PM

## 2019-02-19 NOTE — Progress Notes (Signed)
I went in to do morning CBG and awakened patient to let her know I was going to prick her finger.  When I held her finger to stick with lancet patient became hostile and began swinging at me and jerked her finger from my hands.  She kept saying she wasn't fighting.  With much difficulty I was able to complete doing her CBG.

## 2019-02-19 NOTE — Progress Notes (Signed)
[name] Progress Note  Subjective: doing well, not seen in person due to covid, creat down slightly today.   Vitals:   02/18/19 1900 02/18/19 2300 02/19/19 0400 02/19/19 0756  BP: (!) 160/48  (!) 152/66 (!) 168/57  Pulse: (!) 106  89   Resp: 20     Temp: 97.9 F (36.6 C) 98 F (36.7 C) 98.1 F (36.7 C) 98 F (36.7 C)  TempSrc: Oral Oral Oral Oral  SpO2: 100%  99%   Weight:      Height:        Exam:  Patient not examined today directly given COVID-19 + status, utilizing data taken from chart +/- discussions w/ providers and staff.    Date               Creat               eGFR  2008- 10         0.8- 1.0  2011- 14         1.0- 1.5  2015- 17         1.1- 1.4  2018               1.35- 1.63  2019               1.33- 1.69  Nov 2020        1.44- 1.61        33- 40 , stage III   Home meds:  - aspirin 81/ atrovastatin 80 qd  - losartan 50 qd  - insulin 70/30 6-8 u tid ac  - pregabalin 75 bid/ hydrocodone qid prn/ aspirin-caffeine tabs qd prn  - allopurinol 300 hd/ synthroid 200 ug  - abatacept 750 mg IV q 4 weeks/ leflunomide 63m qd     UA 2/8 - >300 protein, 0-5 rbc/ wbc, 5.0, 1.014, cloudy   UNa 39  UCr 71      BP's normal to high 130 - 150's /  47- 77    Renal [place]> IMPRESSION:  8.7cm and 10.4 cm Increased echogenicity of renal parenchyma is noted bilaterally consistent with medical renal disease. No hydronephrosis or renal obstruction is noted.     CXR - no acute disease   Assessment/ Plan: 1. AoCKD 3 - baseline creat 1.4- 1.7 from Nov '20 .  Here creat 4.2 > 3.5 > 4.0.  Suspect AKI due to vol depletion + ARB prior to admit. Renal [place] hydro, +echogenic kidneys. Making urine.  - DC'd nsaid gel and PPI - urine prot-cr ratio is negative - creat down slightly today at 3.8 w/ IVF's bicarb at 75/hr - BP's are stable - no new suggestions, will follow  2. COVID + PNA - on IV decadron and remdesivir, + IV abx, on RA 3. Diarrhea - poss d/t #2,  per primary 4. Anemia ckd - Hb 8-10 range, getting darbe here 40 ug SQ weekly. Pt is jFara [name].  5. DM on insulin  6. HTN - bp's okay, no need for BP meds at this time. Avoid all acei/ ARB 7. H/o aortic stenosis    8. H/o RA        [name] 02/19/2019, 9:55 AM   Recent Labs  Lab 02/15/19 0901 02/15/19 1128 02/16/19 0241 02/17/19 0234 02/18/19 0050 02/19/19 0610  K   < >  --  4.7   < > 5.0 4.5  BUN   < >  --  65*   < > 80* 81*  CREATININE   < >  --  3.52*   < > 4.08* 3.88*  CALCIUM   < >  --  7.9*   < > 8.6* 7.6*  PHOS  --  4.1 3.5  --   --   --    < > = values in this interval not displayed.   Inpatient medications: . allopurinol  300 mg Oral Daily  . aspirin  81 mg Oral Daily  . atorvastatin  80 mg Oral q1800  . darbepoetin (ARANESP) injection - NON-DIALYSIS  40 mcg Subcutaneous Q Wed-1800  . dexamethasone (DECADRON) injection  6 mg Intravenous Q24H  . heparin injection (subcutaneous)  7,500 Units Subcutaneous Q8H  . insulin aspart  0-9 Units Subcutaneous Q4H  . sodium chloride flush  3 mL Intravenous Q12H   . ferumoxytol 510 mg (02/15/19 1536)  . remdesivir 100 mg in NS 100 mL Stopped (02/18/19 1000)  .  sodium bicarbonate  infusion 1000 mL 75 mL/hr at 02/19/19 0602   acetaminophen, cyclobenzaprine, HYDROcodone-acetaminophen, menthol-cetylpyridinium, ondansetron **OR** ondansetron (ZOFRAN) IV, sodium chloride flush       

## 2019-02-19 NOTE — Progress Notes (Signed)
Pt appears more confused and agitated at night, possible sundown. She was adamant about putting on her mesh panties, and PJ pants. I educated patient that the tubing of her foley cathether could possibly kink, pull or backflow. Pt refused to remove pants. Also became agitated, stated blue tourniquet was her tape and she wanted it now. I explained to her it was just used to start her IVs but patient was confused and adamant that it belonged to her. Will continue to monitor.

## 2019-02-19 NOTE — Progress Notes (Signed)
Pt appetite is poor. Consumed less than 5% of lunch and dinner tray.

## 2019-02-19 NOTE — Plan of Care (Signed)
  Problem: Education: Goal: Knowledge of risk factors and measures for prevention of condition will improve Outcome: Progressing   

## 2019-02-19 NOTE — Plan of Care (Signed)
Maintaining saturation on room air. On Bicarb drip. Problem: Respiratory: Goal: Will maintain a patent airway Outcome: Progressing

## 2019-02-19 NOTE — Plan of Care (Signed)
  Problem: Education: Goal: Knowledge of risk factors and measures for prevention of condition will improve 02/19/2019 1633 by Levie Heritage, RN Outcome: Progressing 02/19/2019 1626 by Levie Heritage, RN Outcome: Progressing

## 2019-02-20 LAB — COMPREHENSIVE METABOLIC PANEL
ALT: 46 U/L — ABNORMAL HIGH (ref 0–44)
AST: 77 U/L — ABNORMAL HIGH (ref 15–41)
Albumin: 2.5 g/dL — ABNORMAL LOW (ref 3.5–5.0)
Alkaline Phosphatase: 109 U/L (ref 38–126)
Anion gap: 12 (ref 5–15)
BUN: 85 mg/dL — ABNORMAL HIGH (ref 8–23)
CO2: 19 mmol/L — ABNORMAL LOW (ref 22–32)
Calcium: 8.5 mg/dL — ABNORMAL LOW (ref 8.9–10.3)
Chloride: 111 mmol/L (ref 98–111)
Creatinine, Ser: 3.8 mg/dL — ABNORMAL HIGH (ref 0.44–1.00)
GFR calc Af Amer: 12 mL/min — ABNORMAL LOW (ref >60)
GFR calc non Af Amer: 11 mL/min — ABNORMAL LOW (ref >60)
Glucose, Bld: 157 mg/dL — ABNORMAL HIGH (ref 70–99)
Potassium: 4.7 mmol/L (ref 3.5–5.1)
Sodium: 142 mmol/L (ref 135–145)
Total Bilirubin: 0.6 mg/dL (ref 0.3–1.2)
Total Protein: 5.5 g/dL — ABNORMAL LOW (ref 6.5–8.1)

## 2019-02-20 LAB — GLUCOSE, CAPILLARY
Glucose-Capillary: 240 mg/dL — ABNORMAL HIGH (ref 70–99)
Glucose-Capillary: 164 mg/dL — ABNORMAL HIGH (ref 70–99)
Glucose-Capillary: 167 mg/dL — ABNORMAL HIGH (ref 70–99)
Glucose-Capillary: 223 mg/dL — ABNORMAL HIGH (ref 70–99)
Glucose-Capillary: 154 mg/dL — ABNORMAL HIGH (ref 70–99)
Glucose-Capillary: 347 mg/dL — ABNORMAL HIGH (ref 70–99)
Glucose-Capillary: 404 mg/dL — ABNORMAL HIGH (ref 70–99)

## 2019-02-20 LAB — CBC WITH DIFFERENTIAL/PLATELET
Abs Immature Granulocytes: 0.31 10*3/uL — ABNORMAL HIGH (ref 0.00–0.07)
Basophils Absolute: 0 10*3/uL (ref 0.0–0.1)
Basophils Relative: 0 %
Eosinophils Absolute: 0 10*3/uL (ref 0.0–0.5)
Eosinophils Relative: 0 %
HCT: 28.1 % — ABNORMAL LOW (ref 36.0–46.0)
Hemoglobin: 9.6 g/dL — ABNORMAL LOW (ref 12.0–15.0)
Immature Granulocytes: 5 %
Lymphocytes Relative: 23 %
Lymphs Abs: 1.4 10*3/uL (ref 0.7–4.0)
MCH: 29 pg (ref 26.0–34.0)
MCHC: 34.2 g/dL (ref 30.0–36.0)
MCV: 84.9 fL (ref 80.0–100.0)
Monocytes Absolute: 0.8 10*3/uL (ref 0.1–1.0)
Monocytes Relative: 12 %
Neutro Abs: 3.8 10*3/uL (ref 1.7–7.7)
Neutrophils Relative %: 60 %
Platelets: 262 10*3/uL (ref 150–400)
RBC: 3.31 MIL/uL — ABNORMAL LOW (ref 3.87–5.11)
RDW: 15.3 % (ref 11.5–15.5)
WBC: 6.4 10*3/uL (ref 4.0–10.5)
nRBC: 0.9 % — ABNORMAL HIGH (ref 0.0–0.2)

## 2019-02-20 LAB — FERRITIN: Ferritin: 2176 ng/mL — ABNORMAL HIGH (ref 11–307)

## 2019-02-20 LAB — D-DIMER, QUANTITATIVE: D-Dimer, Quant: 9.32 ug/mL-FEU — ABNORMAL HIGH (ref 0.00–0.50)

## 2019-02-20 LAB — C-REACTIVE PROTEIN: CRP: 1.5 mg/dL — ABNORMAL HIGH (ref <1.0)

## 2019-02-20 MED ORDER — INSULIN ASPART 100 UNIT/ML ~~LOC~~ SOLN
9.0000 [IU] | Freq: Once | SUBCUTANEOUS | Status: AC
  Administered 2019-02-20: 9 [IU] via SUBCUTANEOUS

## 2019-02-20 MED ORDER — HYDRALAZINE HCL 25 MG PO TABS
25.0000 mg | ORAL_TABLET | Freq: Three times a day (TID) | ORAL | Status: DC
  Administered 2019-02-20 – 2019-02-27 (×22): 25 mg via ORAL
  Filled 2019-02-20 (×22): qty 1

## 2019-02-20 MED ORDER — PREDNISONE 20 MG PO TABS: 40.0000 mg | ORAL_TABLET | Freq: Every day | ORAL | Status: DC

## 2019-02-20 MED ORDER — ALLOPURINOL 100 MG PO TABS
200.0000 mg | ORAL_TABLET | Freq: Every day | ORAL | Status: DC
  Administered 2019-02-21 – 2019-02-27 (×7): 200 mg via ORAL
  Filled 2019-02-20 (×7): qty 2

## 2019-02-20 MED ORDER — PREDNISONE 20 MG PO TABS
40.0000 mg | ORAL_TABLET | Freq: Every day | ORAL | Status: DC
  Administered 2019-02-20 – 2019-02-22 (×3): 40 mg via ORAL
  Filled 2019-02-20 (×3): qty 2

## 2019-02-20 NOTE — Progress Notes (Addendum)
[facility] Progress Note  Subjective: I/o even, 1400 UOP. BP's remain high. Creat 3.8, unchanged, BUN 85 stable.   Per pmd no confusion or SOB, some pretib edema.   Vitals:   02/19/19 1527 02/19/19 1934 02/19/19 2349 02/20/19 0400  BP: (!) 158/64 (!) 172/54 (!) 187/63 (!) 182/55  Pulse: (!) 53 60 60   Resp: _0 Temp: 97.6 F (36.4 C) 98.5 F (36.9 C) 97.9 F (36.6 C) 98.2 F (36.8 C)  TempSrc: Oral Oral Oral Oral  SpO2: 95% (!) 87% 94% 97%  Weight:      Height:        Exam:  Patient not examined today directly given COVID-19 + status, utilizing data taken from chart +/- discussions w/ providers and staff.    Date               Creat               eGFR  2008- 10         0.8- 1.0  2011- 14         1.0- 1.5  2015- 17         1.1- 1.4  2018               1.35- 1.63  2019               1.33- 1.69  Nov 2020        1.44- 1.61        33- 40 , stage III   Home meds:  - aspirin 81/ atrovastatin 80 qd  - losartan 50 qd  - insulin 70/30 6-8 u tid ac  - pregabalin 75 bid/ hydrocodone qid prn/ aspirin-caffeine tabs prn  - allopurinol 300 hd/ synthroid 200 ug  - abatacept 750 mg IV q 4 weeks/ leflunomide 60m qd     UA 2/8 - >300 protein, 0-5 rbc/ wbc, 5.0, 1.014, cloudy   UNa 39  UCr 71      BP's normal to high 130 - 150's /  47- 77    Renal [place]> IMPRESSION:  8.7cm and 10.4 cm, no hydro, ^'d echo     CXR - no acute disease   Assessment/ Plan: 1. AoCKD 3 - baseline creat 1.4- 1.7 from Nov '20 .  Here creat 4.2 > 3.5 > 4.0.  Suspected due to vol depletion+ CKD+ ARB prior to admit. Renal [place] hydro, +echogenic kidneys. Making urine. DC'd nsaid gel and PPI. Urine prot-cr ratio negative - creat stable 3.8 today, BUN 80's - will lower bicarb gtt to 60/hr - BP's stable, mild confusion comes and goes - remains at risk for requiring RRT, will reassess daily, not yet  2. HTN - bp's remain high, holding home ARB, will start hydralazine 25 tid, titrate as  needed 3. COVID + PNA - on IV decadron and remdesivir, + IV abx, on RA 4. Anemia ckd - Hb 8-10 range, sp darbe 40 ug SQ 2/10 then q wed. Pt is jFara Boroswitness.  5. DM on insulin  6. H/o aortic stenosis    7. H/o RA   Rob Seven [place] 02/20/2019, 7:14 AM   Recent Labs  Lab 02/15/19 0901 02/15/19 1128 02/16/19 0241 02/17/19 0234 02/18/19 0050 02/19/19 0610  K   < >  --  4.7   < > 5.0 4.5  BUN   < >  --  65*   < > 80* 81*  CREATININE   < >  --  3.52*   < > 4.08* 3.88*  CALCIUM   < >  --  7.9*   < > 8.6* 7.6*  PHOS  --  4.1 3.5  --   --   --    < > = values in this interval not displayed.   Inpatient medications: . allopurinol  300 mg Oral Daily  . aspirin  81 mg Oral Daily  . atorvastatin  80 mg Oral q1800  . darbepoetin (ARANESP) injection - NON-DIALYSIS  40 mcg Subcutaneous Q Wed-1800  . dexamethasone  6 mg Oral Daily  . heparin injection (subcutaneous)  7,500 Units Subcutaneous Q8H  . insulin aspart  0-9 Units Subcutaneous Q4H  . insulin glargine  8 Units Subcutaneous Daily  . sodium chloride flush  3 mL Intravenous Q12H   . ferumoxytol 510 mg (02/15/19 1536)  .  sodium bicarbonate  infusion 1000 mL 75 mL/hr at 02/19/19 0602   acetaminophen, cyclobenzaprine, HYDROcodone-acetaminophen, menthol-cetylpyridinium, ondansetron **OR** ondansetron (ZOFRAN) IV, sodium chloride flush

## 2019-02-20 NOTE — Progress Notes (Signed)
Called to evaluate LUA midline. Attempted to flush L midline. Midline occluded, catheter bent. L midline removed 8 cm intact, gauze dressing applied. Attempted PIV LAFA using u/s. Just as skin punctured with iv, patient said to " Pull Back" and refused further advancement of iv catheter. Removed iv catheter as patient requested, gauze dressing applied. Patient refused any iv further attempts. Notified patients RN.

## 2019-02-20 NOTE — Progress Notes (Signed)
Physical Therapy Treatment Patient Details Name: [name] MRN: [id number] DOB: Jan 25, 1940 Today's Date: 02/20/2019    History of Present Illness  79 year old female admitted with Acute renal failure superimposed on Stage 3 chronic kidney disease. Hypothyroid , DM, and chronic pain- coupled with Covid 19 for this admission   PT Comments    She was lying in bed when PT entered room and was demanding that the RN contact the physician- that she "had to speak to him in person right now". The RN tried to reassure her, but could not re-direct. She was able to reposition in bed, but then became agitated to the point of combative as it excalated.She would not allow PT to touch her to assist in bed mobility or any ex- and said "I hurt all over, leave me alone, I do not have to do any PT". She was still demanding that the RN get the physician immediately when PT left the room. Am not sure is she has Sundowner's or what exactly triggered these behaviors. Will continue to schedule for PT and determine ability to consistently participate in meaningful way for goals as outlined by primary PT in evaluation and POC>  Follow Up Recommendations  Home health PT;Supervision/Assistance - 24 hour     Equipment Recommendations       Recommendations for Other Services       Precautions / Restrictions Precautions Precautions: Fall Precaution Comments: While she began to initiate bed mobility when PT entered room- she was very focused on and demanding to talk with the physician. Tried to reassure her that the RN (RN in room) was planning to contact the Dr.- she become very agitated and it escalated Restrictions Weight Bearing Restrictions: No    Mobility  Bed Mobility Overal bed mobility: Needs Assistance             General bed mobility comments: She refused to do anything more with PT, and become very agitated.  Transfers                    Ambulation/Gait                  Stairs             Wheelchair Mobility    Modified Rankin (Stroke Patients Only)       Balance Overall balance assessment: (She refused to sit on edge of bed today, and absolutely stated "she did not want to do anything else and wanted to talk with the DR'- could not be re-directed.)                                          Cognition Arousal/Alertness: Awake/alert Behavior During Therapy: Agitated;Anxious;Restless(Agitated to the point of becoming combative.) Overall Cognitive Status: No family/caregiver present to determine baseline cognitive functioning Area of Impairment: Safety/judgement;Problem solving                               General Comments: She kept repeating that she wanted to talk with the DR- and was demanding the RN get him "right now" when PT left the Room      Exercises Other Exercises Other Exercises: Reminded to do ankle pumps and move LEs as she needed to increase mobility- her reaction was I hurt and do not touch  me.    General Comments        Pertinent Vitals/Pain Pain Assessment: 0-10 Pain Score: 7  Pain Location: All joints Pain Descriptors / Indicators: Aching;Constant    Home Living                      Prior Function            PT Goals (current goals can now be found in the care plan section) Acute Rehab PT Goals Patient Stated Goal: to go home PT Goal Formulation: With patient Time For Goal Achievement: 03/02/19 Potential to Achieve Goals: Good Progress towards PT goals: PT to reassess next treatment    Frequency    Min 3X/week      PT Plan Current plan remains appropriate    Co-evaluation              AM-PAC PT "6 Clicks" Mobility   Outcome Measure  Help needed turning from your back to your side while in a flat bed without using bedrails?: A Little(could not complete any bed mobility as she would not let me touch her at all) Help needed moving from lying on  your back to sitting on the side of a flat bed without using bedrails?: A Little Help needed moving to and from a bed to a chair (including a wheelchair)?: A Little Help needed standing up from a chair using your arms (e.g., wheelchair or bedside chair)?: A Little Help needed to walk in hospital room?: A Little Help needed climbing 3-5 steps with a railing? : A Lot 6 Click Score: 17    End of Session     Patient left: in bed;with call bell/phone within reach;with bed alarm set Nurse Communication: Mobility status PT Visit Diagnosis: Unsteadiness on feet (R26.81);Difficulty in walking, not elsewhere classified (R26.2)     Time: 0440-0500 PT Time Calculation (min) (ACUTE ONLY): 20 min  Charges:  $Therapeutic Activity: 8-22 mins                    Rollen Sox, PT # [id number] CGV cell   Casandra Doffing 02/20/2019, 5:51 PM

## 2019-02-20 NOTE — Progress Notes (Signed)
Hospitalist progress note   Patient from home, Patient going likely home after discharge, Dispo pending at this time improvement of renal function--likely in next several days  [name] [id number] DOB: Jan 22, 1940 DOA: 02/14/2019  PCP: Jonathon Jordan, MD   Narrative:  79 year old [name], CKD 4, hypothyroid and is status post thyroidectomy 1990-follows with Dr.Kerr, rheumatoid arthritis HTN, upper extremity DVT 2017, CVA 2018, mild aortic stenosis, DM TY 2, prior hepatic hypodensity 2/5 through 02/12/2018 symptomatic bradycardia, near syncope--- at the hospital stay evaluated and found to have worsening aortic stenosis Patient has been evaluated in the past by EP did not feel she needed a pacemaker at that time also had a CVA 02/2016 Admit from home 2/8 with significan abd pain multyiple loose BM and found to have Covid Also AKI on admit creat up from basline 1.6--->4 neprhiology consulted and gave opinion She has not required oxygen since admit  diarreha thought to be 2/2 covid   Data Reviewed:  BUN/creatinine baseline 50/4-->65/3.5-->74/3.7-->80/4.08--85/3.8 Potassium 5.0-->4.5-->4.7 AST/ALT 63/34-->33/26-->44/26 Hemoglobin baseline usually is 10 range on admission 7.0 currently 9.6  COVID-19 Labs  Recent Labs    02/18/19 0050 02/19/19 0610 02/20/19 0700  DDIMER 7.00* 3.95* 9.32*  FERRITIN 1,596* 1,336* 2,176*  CRP 6.6* 2.3* 1.5*    No results found for: [place]:  Coronavirus 19 infection Dimer+ Dopplers which are negative for DVT 2/10 Continue remdesivir/Decadron 2/9--2/13 Not candidate for Actemra 2/2 immunosuppressants ?  Diarrheal disease? Diverticular disease vs Covid related diarrhea empiric ceftriaxone and Flagyl --stopped 2/13 --no recurrence--was likely viral assoc with Covid Having 1-2 loose BM daily Je[employer]'s Witness Monitor trends-continue iron supplementation may consider IV iron although ferritin is a  contraindication AKI-new, baseline between 1.4 and 1.6 creatinine Suspect 2/2 ATN from diarrheal illness preceding admit Renal US shows medical renal disease UA shows >300 protein and mild hemoglobin--Pro/Creat ratio neg FeNa intrinsic renal Neprho input appreciated--stopping NSAID gel and PPI--cootinuing today d5 + Bicarb at 60 cc/h per Renal   Hopeful for overall improvement--I will discuss with family implications of this Rheumatoid arthritis Follows with Dr. Arlean Hopping at this time I am holding leflunomide 10 daily abatacept 750 q. 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 Check outpatient TSH Hepatic hypodensity Most recent ultrasound performed of kidneys did not show anything specific on liver May need outpatient characterization of the same  Called and discussed with daughter [phone] and updated 2/14--she understands kidneys are not recovering-we await some improvement but this might need to be followed as OP  Subjective:  Not eating or really drinking Feeling swollen some No cp no fever Nursing reports confusion overnight and maybe some sundowning She is apporp today in the am and working with OT   Consultants:   None at this time  Objective: Vitals:   02/20/19 0700 02/20/19 0710 02/20/19 0720 02/20/19 0725  BP:   (!) 176/72 (!) 177/65  Pulse: (!) 55 (!) 50 (!) 50 (!) 49  Resp: 13 12 12 15   Temp:    98.6 F (37 C)  TempSrc:    Oral  SpO2: 99% 98% 98% 97%  Weight:      Height:        Intake/Output Summary (Last 24  hours) at 02/20/2019 1013 Last data filed at 02/20/2019 0500 Gross per 24 hour  Intake 903.76 ml  Output 1450 ml  Net -546.24 ml   Filed Weights   02/14/19 2307 02/14/19 2349  Weight: 81.6 kg 81.6 kg    Examination:  Soft slow speech but awake cta b no added sound Neuro intact no focal deficit Swollen to grd 2 in LE and some in forearms ROM intact no focal deficit  Scheduled Meds: . allopurinol  300 mg Oral Daily  . aspirin  81 mg Oral Daily  . atorvastatin  80 mg Oral q1800  . darbepoetin (ARANESP) injection - NON-DIALYSIS  40 mcg Subcutaneous Q Wed-1800  . dexamethasone  6 mg Oral Daily  . heparin injection (subcutaneous)  7,500 Units Subcutaneous Q8H  . hydrALAZINE  25 mg Oral Q8H  . insulin aspart  0-9 Units Subcutaneous Q4H  . insulin glargine  8 Units Subcutaneous Daily  . sodium chloride flush  3 mL Intravenous Q12H   Continuous Infusions: . ferumoxytol 510 mg (02/15/19 1536)  .  sodium bicarbonate  infusion 1000 mL 75 mL/hr at 02/19/19 0602     LOS: 5 days   Time spent: [place], MD [facility]  02/20/2019, 10:13 AM

## 2019-02-20 NOTE — Progress Notes (Signed)
Foley catheter clamped 1630. Patient has no urge to void yet. Will continue to monitor.

## 2019-02-20 NOTE — Progress Notes (Signed)
Patient refused IV access by IV team. Currently no IV access. Will have night shift reassess.

## 2019-02-20 NOTE — Progress Notes (Signed)
Pt is refusing blood sugars and blood pressures at current time. Seems more agitated and hostile. Pt stated that she is black and blue and doesn't want blood pressures or blood sugars. RN explained to explained to patient that she is receiving blood thinners and this causes the skin to bruise more easily. Called daughter at bedside and it seems that patient doesn't know how to communicate pain effectively. She becomes agitated and refuses treatments. NT wrote pt refused blood sugar and VS. Pt was hostile and agitated with physical therapist. Norco given. Refused flexeril. Will reassess. Pt and daughter is also requesting to start back prednisone for RA pain. MD aware. Pt is also requesting to speak with MD. MD aware.

## 2019-02-20 NOTE — Progress Notes (Signed)
Occupational Therapy Treatment Patient Details Name: [name] MRN: [id number] DOB: [birth year] Today's Date: 02/20/2019    History of present illness [name] is a 79 y.o. BF Jehovah's Witness PMHx hypothyroidism, CKD stage III, rheumatoid arthritis, DM type II controlled , chronic anemia, chronic pain, and aortic stenosis, removal intracranial meneigioma. Presenting to the emergency department 02/14/19  for evaluation of lethargy, nausea, vomiting, diarrhea, left lower quadrant abdominal pain, and loss of appetite.  Family called EMS due to patient being essentially bedbound and increasingly lethargic for the past few days.  Household contacts have had COVID-19 recently. Patient positive for Covid virus.   OT comments  Pt making progress in therapy, demonstrating improved activity tolerance. Pt able to ambulate to/from bathroom with RW and min guard. Noted 0 instances of loss of balance, however pt unsteady on feet. Pt tolerated standing 1 x 8 min at the sink to complete grooming and sponge bathing tasks. Pt on room air with SpO2 maintaining in 90s throughout. Continued education with pt on safety strategies and fall prevention techniques with fair understanding. Pt required increased time and cues to complete all tasks due to slow processing and confusion. OT will continue to follow acutely.    Follow Up Recommendations  Home health OT;Supervision/Assistance - 24 hour    Equipment Recommendations  None recommended by OT    Recommendations for Other Services      Precautions / Restrictions Precautions Precautions: Fall Restrictions Weight Bearing Restrictions: No       Mobility Bed Mobility               General bed mobility comments: Pt seated in bedside chair upon OT arrival.   Transfers Overall transfer level: Needs assistance Equipment used: Rolling walker (2 wheeled) Transfers: Sit to/from [facility] Sit to Stand: Min guard Stand  pivot transfers: Min guard       General transfer comment: Min guard to ensure balance and safety.     Balance Overall balance assessment: Mild deficits observed, not formally tested                                         ADL either performed or assessed with clinical judgement   ADL Overall ADL's : Needs assistance/impaired     Grooming: Supervision/safety;Min guard;Standing   Upper Body Bathing: Min guard;Supervision/ safety;Standing   Lower Body Bathing: Min guard;Supervison/ safety;Sit to/from stand                       Functional mobility during ADLs: Min guard;Rolling walker General ADL Comments: Pt able to ambulate to/from bathroom with RW and min guard. Noted 0 instances of LOB, however pt unsteady on feet.      Vision       Perception     Praxis      Cognition Arousal/Alertness: Awake/alert Behavior During Therapy: WFL for tasks assessed/performed Overall Cognitive Status: No family/caregiver present to determine baseline cognitive functioning                                          Exercises     Shoulder Instructions       General Comments Pt on room air with SpO2 maintaining in 90s throughout. Pt required increased time and cues  to complete all tasks.     Pertinent Vitals/ Pain       Pain Assessment: Faces Faces Pain Scale: Hurts even more Pain Location: All joints Pain Descriptors / Indicators: Aching Pain Intervention(s): Limited activity within patient's tolerance;Monitored during session;Repositioned  Home Living                                          Prior Functioning/Environment              Frequency           Progress Toward Goals  OT Goals(current goals can now be found in the care plan section)  Progress towards OT goals: Progressing toward goals  ADL Goals Pt Will Perform Grooming: with modified independence;standing Pt Will Perform Lower Body  Bathing: with modified independence;sit to/from stand Pt Will Perform Lower Body Dressing: with modified independence;sit to/from stand Pt Will Transfer to Toilet: with modified independence;ambulating;regular height toilet Pt Will Perform Toileting - Clothing Manipulation and hygiene: with modified independence;sit to/from stand Additional ADL Goal #1: Pt to tolerate standing up to 10 min with modified independence, in preparation for ADLs. Additional ADL Goal #2: Pt to recall and verbalize 3 fall prevention strategies with min verbal cues.  Plan Discharge plan remains appropriate    Co-evaluation                 AM-PAC OT "6 Clicks" Daily Activity     Outcome Measure   Help from another person eating meals?: A Little Help from another person taking care of personal grooming?: A Little Help from another person toileting, which includes using toliet, bedpan, or urinal?: A Lot Help from another person bathing (including washing, rinsing, drying)?: A Lot Help from another person to put on and taking off regular upper body clothing?: A Little Help from another person to put on and taking off regular lower body clothing?: A Lot 6 Click Score: 15    End of Session Equipment Utilized During Treatment: Gait belt;Rolling walker  OT Visit Diagnosis: Unsteadiness on feet (R26.81);Muscle weakness (generalized) (M62.81)   Activity Tolerance Patient tolerated treatment well   Patient Left in chair;with call bell/phone within reach;with chair alarm set   Nurse Communication Mobility status        Time: 1030-1107 OT Time Calculation (min): 37 min  Charges: OT General Charges $OT Visit: 1 Visit OT Treatments $Self Care/Home Management : 8-22 mins $Therapeutic Activity: 8-22 mins  Mauri Brooklyn OTR/L [phone]   Mauri Brooklyn 02/20/2019, 11:24 AM

## 2019-02-20 NOTE — Progress Notes (Signed)
Pt appetite still poor. Only consumed 250 mL vital cuisine nutrition recovery shake. Refused remainder of tray.

## 2019-02-20 NOTE — Plan of Care (Signed)
  Problem: Education: Goal: Knowledge of risk factors and measures for prevention of condition will improve Outcome: Progressing   

## 2019-02-21 ENCOUNTER — Encounter (HOSPITAL_COMMUNITY): Payer: Medicare Other

## 2019-02-21 LAB — CBC WITH DIFFERENTIAL/PLATELET
Abs Immature Granulocytes: 0.2 10*3/uL — ABNORMAL HIGH (ref 0.00–0.07)
Basophils Absolute: 0 10*3/uL (ref 0.0–0.1)
Basophils Relative: 0 %
Eosinophils Absolute: 0 10*3/uL (ref 0.0–0.5)
Eosinophils Relative: 0 %
HCT: 28.8 % — ABNORMAL LOW (ref 36.0–46.0)
Hemoglobin: 9.9 g/dL — ABNORMAL LOW (ref 12.0–15.0)
Lymphocytes Relative: 18 %
Lymphs Abs: 1.2 10*3/uL (ref 0.7–4.0)
MCH: 29.2 pg (ref 26.0–34.0)
MCHC: 34.4 g/dL (ref 30.0–36.0)
MCV: 85 fL (ref 80.0–100.0)
Metamyelocytes Relative: 1 %
Monocytes Absolute: 0.5 10*3/uL (ref 0.1–1.0)
Monocytes Relative: 8 %
Myelocytes: 2 %
Neutro Abs: 4.8 10*3/uL (ref 1.7–7.7)
Neutrophils Relative %: 71 %
Platelets: 285 10*3/uL (ref 150–400)
RBC: 3.39 MIL/uL — ABNORMAL LOW (ref 3.87–5.11)
RDW: 15.3 % (ref 11.5–15.5)
WBC: 6.8 10*3/uL (ref 4.0–10.5)
nRBC: 2.4 % — ABNORMAL HIGH (ref 0.0–0.2)

## 2019-02-21 LAB — COMPREHENSIVE METABOLIC PANEL
ALT: 38 U/L (ref 0–44)
AST: 42 U/L — ABNORMAL HIGH (ref 15–41)
Albumin: 2.3 g/dL — ABNORMAL LOW (ref 3.5–5.0)
Alkaline Phosphatase: 112 U/L (ref 38–126)
Anion gap: 11 (ref 5–15)
BUN: 87 mg/dL — ABNORMAL HIGH (ref 8–23)
CO2: 20 mmol/L — ABNORMAL LOW (ref 22–32)
Calcium: 8.2 mg/dL — ABNORMAL LOW (ref 8.9–10.3)
Chloride: 107 mmol/L (ref 98–111)
Creatinine, Ser: 3.53 mg/dL — ABNORMAL HIGH (ref 0.44–1.00)
GFR calc Af Amer: 13 mL/min — ABNORMAL LOW (ref >60)
GFR calc non Af Amer: 12 mL/min — ABNORMAL LOW (ref >60)
Glucose, Bld: 204 mg/dL — ABNORMAL HIGH (ref 70–99)
Potassium: 4.7 mmol/L (ref 3.5–5.1)
Sodium: 138 mmol/L (ref 135–145)
Total Bilirubin: 0.6 mg/dL (ref 0.3–1.2)
Total Protein: 5.4 g/dL — ABNORMAL LOW (ref 6.5–8.1)

## 2019-02-21 LAB — GLUCOSE, CAPILLARY
Glucose-Capillary: 217 mg/dL — ABNORMAL HIGH (ref 70–99)
Glucose-Capillary: 195 mg/dL — ABNORMAL HIGH (ref 70–99)
Glucose-Capillary: 151 mg/dL — ABNORMAL HIGH (ref 70–99)
Glucose-Capillary: 192 mg/dL — ABNORMAL HIGH (ref 70–99)
Glucose-Capillary: 348 mg/dL — ABNORMAL HIGH (ref 70–99)

## 2019-02-21 LAB — FERRITIN: Ferritin: 1931 ng/mL — ABNORMAL HIGH (ref 11–307)

## 2019-02-21 LAB — C-REACTIVE PROTEIN: CRP: 0.9 mg/dL (ref <1.0)

## 2019-02-21 LAB — D-DIMER, QUANTITATIVE: D-Dimer, Quant: 10.53 ug/mL-FEU — ABNORMAL HIGH (ref 0.00–0.50)

## 2019-02-21 MED ORDER — INSULIN ASPART 100 UNIT/ML ~~LOC~~ SOLN
0.0000 [IU] | Freq: Three times a day (TID) | SUBCUTANEOUS | Status: DC
  Administered 2019-02-21: 3 [IU] via SUBCUTANEOUS
  Administered 2019-02-21: 17:00:00 8 [IU] via SUBCUTANEOUS
  Administered 2019-02-22: 3 [IU] via SUBCUTANEOUS
  Administered 2019-02-22 – 2019-02-23 (×2): 5 [IU] via SUBCUTANEOUS
  Administered 2019-02-23 – 2019-02-24 (×4): 2 [IU] via SUBCUTANEOUS
  Administered 2019-02-24: 18:00:00 5 [IU] via SUBCUTANEOUS
  Administered 2019-02-25: 3 [IU] via SUBCUTANEOUS
  Administered 2019-02-25: 2 [IU] via SUBCUTANEOUS
  Administered 2019-02-26 – 2019-02-27 (×2): 3 [IU] via SUBCUTANEOUS

## 2019-02-21 MED ORDER — SODIUM CHLORIDE 0.9% FLUSH
10.0000 mL | Freq: Two times a day (BID) | INTRAVENOUS | Status: DC
  Administered 2019-02-21 – 2019-02-27 (×12): 10 mL

## 2019-02-21 MED ORDER — HEPARIN SODIUM (PORCINE) 10000 UNIT/ML IJ SOLN
7500.0000 [IU] | Freq: Three times a day (TID) | INTRAMUSCULAR | Status: DC
  Administered 2019-02-21 – 2019-02-24 (×9): 7500 [IU] via SUBCUTANEOUS
  Filled 2019-02-21 (×9): qty 1

## 2019-02-21 MED ORDER — SODIUM CHLORIDE 0.9% FLUSH: 10.0000 mL | INTRAVENOUS | Status: DC | PRN

## 2019-02-21 MED ORDER — INSULIN ASPART 100 UNIT/ML ~~LOC~~ SOLN: 3.0000 [IU] | Freq: Once | SUBCUTANEOUS | Status: DC

## 2019-02-21 NOTE — Progress Notes (Signed)
Physical Therapy Treatment Patient Details Name: [name] MRN: [id number] DOB: 14-Aug-1940 Today's Date: 02/21/2019    History of Present Illness [name] is a 79 y.o. BF Jehovah's Witness PMHx hypothyroidism, CKD stage III, rheumatoid arthritis, DM type II controlled , chronic anemia, chronic pain, and aortic stenosis, removal intracranial meneigioma. Presenting to the emergency department 02/14/19  for evaluation of lethargy, nausea, vomiting, diarrhea, left lower quadrant abdominal pain, and loss of appetite.  Family called EMS due to patient being essentially bedbound and increasingly lethargic for the past few days.  Household contacts have had COVID-19 recently. Patient positive for Covid virus.    PT Comments    Patient in bed and 2 RNs were in room trying to convince her to allow them to clean her up. She has had multiple UTIs and had had a BM with feces all around her catheter. She was physically resisting any efforts to help her clean up. One of the nurses called her daughter and placed her on speaker phone- daughter attempted to reassure and calm her mother down, as she indicates a lot of these behaviors are how she acts at home too. Finally with assist of 2 we were able to get her from supine to sit edge of bed, sit<>stand with RW, ambulated to [facility] chair- max cues to get her to stand up to allow further hygiene to be done. Finally repositioned in chair with cover in lap, remote/call light in reach, BS table in reach. Chair alarm activated. Very difficult session as she was resistent throughout the entire session, and kept repeating that anytime someone touched - no matter how gentle- that they "were purposely hurting her". She is not progressing in PT at this time, but will continue to be rescheduled with the anticipation that as she feels better physically she will be more willing to participate in PT>  Follow Up Recommendations  Home health PT;Supervision/Assistance - 24  hour     Equipment Recommendations       Recommendations for Other Services       Precautions / Restrictions Precautions Precautions: Fall Precaution Comments: She was in bed and RNs (2) were present, needing to clean her up. She has had multiple UTIs and had had BM around catheter. Literally took 25 minutes to convince her to get to edge of bed, and subsequently stand to be cleaned up. Restrictions Weight Bearing Restrictions: No    Mobility  Bed Mobility Overal bed mobility: Needs Assistance Bed Mobility: Supine to Sit     Supine to sit: Min assist Sit to supine: Min assist   General bed mobility comments: Requires extra time to process- and in some cases she totally gets focused on something else- unable to re-direct  Transfers Overall transfer level: Needs assistance Equipment used: Rolling walker (2 wheeled) Transfers: Sit to/from [facility] Sit to Stand: Min guard;+2 physical assistance Stand pivot transfers: +2 safety/equipment;+2 physical assistance       General transfer comment: Min guard to ensure balance and safety.   Ambulation/Gait Ambulation/Gait assistance: Min assist;+2 physical assistance;+2 safety/equipment Gait Distance (Feet): 10 Feet Assistive device: Rolling walker (2 wheeled) Gait Pattern/deviations: Step-through pattern   Gait velocity interpretation: >2.62 ft/sec, indicative of community ambulatory General Gait Details: Used RW to assist bed to BS chair- max cues to ambulate today   Stairs             Wheelchair Mobility    Modified Rankin (Stroke Patients Only)  Balance Overall balance assessment: Mild deficits observed, not formally tested Sitting-balance support: Bilateral upper extremity supported;Feet supported Sitting balance-Leahy Scale: Fair Sitting balance - Comments: Patient would occasionally lose balance and start leaning back at EOB, needing min assist to regain balance   Standing balance  support: Bilateral upper extremity supported;During functional activity Standing balance-Leahy Scale: Fair Standing balance comment: reliant on UE                            Cognition Arousal/Alertness: Awake/alert Behavior During Therapy: Agitated;Anxious;Restless   Area of Impairment: Safety/judgement;Problem solving(RN was able to call her daughter and put her on speaker phone)                       Following Commands: Follows one step commands with increased time;Follows one step commands inconsistently(She was very resistent to following any commands and often the agitation escalates)     Problem Solving: Difficulty sequencing;Requires verbal cues General Comments: She often is difficult to re-direct- she resists having anything done for her- but is not capable of doing the task herself      Exercises General Exercises - Lower Extremity Ankle Circles/Pumps: AAROM;Seated Short Arc Quad: AROM;Seated Other Exercises Other Exercises: Reminded to do ankle pumps and move LEs as she needed to increase mobility- her reaction was I hurt and do not touch me. Other Exercises: Max cues throughout bed mobility, sit<>stand with RW, and ambulating to BS chair    General Comments        Pertinent Vitals/Pain Faces Pain Scale: Hurts even more Pain Location: All joints Pain Descriptors / Indicators: Aching;Constant(caution- even the slightest touch of her skin causes her to become agitated and acuse the person of hurting her)    Home Living                      Prior Function            PT Goals (current goals can now be found in the care plan section) Acute Rehab PT Goals Patient Stated Goal: to go home PT Goal Formulation: With patient Time For Goal Achievement: 03/02/19 Potential to Achieve Goals: Fair Progress towards PT goals: Not progressing toward goals - comment(Hindered by anxiety, restless, agitated, and complaints of pain)     Frequency    Min 3X/week      PT Plan Current plan remains appropriate    Co-evaluation              AM-PAC PT "6 Clicks" Mobility   Outcome Measure  Help needed turning from your back to your side while in a flat bed without using bedrails?: A Little Help needed moving from lying on your back to sitting on the side of a flat bed without using bedrails?: A Little Help needed moving to and from a bed to a chair (including a wheelchair)?: A Little Help needed standing up from a chair using your arms (e.g., wheelchair or bedside chair)?: A Little Help needed to walk in hospital room?: A Little Help needed climbing 3-5 steps with a railing? : A Lot 6 Click Score: 17    End of Session   Activity Tolerance: Other (comment)(restless, agitated and escalating anger)   Nurse Communication: Mobility status(2 RNs present entire session.) PT Visit Diagnosis: Unsteadiness on feet (R26.81);Difficulty in walking, not elsewhere classified (R26.2);Muscle weakness (generalized) (M62.81)     Time: 7829-5621 PT Time Calculation (min) (  ACUTE ONLY): 50 min  Charges:  $Gait Training: 8-22 mins $Therapeutic Activity: 23-37 mins                    Rollen Sox, PT # [phone] CGV cell  Casandra Doffing 02/21/2019, 5:17 PM

## 2019-02-21 NOTE — Progress Notes (Signed)
RN attempted to get patient cleaned up after first refusal attempt. Patient refused to let staff preform foley care, or complete peri care. Pt stated "she would do it herself". RN noted patient would wipe from back to front and smear stool onto her foley catheter and vaginal cavity. Patient was educated on the proper technique but she became agitated and stated "we keep repeating the same thing over and over". Patient refused to allow Primary RN, admission RN or PT to assist and performed peri care herself. The Peri care was still ineffective technique putting her at greater risk for infection. MD aware. Family aware and they tried to encourage her to let us help her. Pt refused foley care CHG, bath and completed ineffectively peri care. Will continue to monitor.

## 2019-02-21 NOTE — Progress Notes (Signed)
Hospitalist progress note   Patient from home, Patient going likely home after discharge, Dispo pending at this time improvement of renal function--likely in next several days  [name] [id number] DOB: [birth year] DOA: 02/14/2019  PCP: Julia Jordan, MD   Narrative:  79 year old [name], CKD 4, hypothyroid and is status post thyroidectomy 1990-follows with Dr.Kerr, rheumatoid arthritis HTN, upper extremity DVT 2017, CVA 2018, mild aortic stenosis, DM TY 2, prior hepatic hypodensity 2/5 through 02/12/2018 symptomatic bradycardia, near syncope--- at the hospital stay evaluated and found to have worsening aortic stenosis Patient has been evaluated in the past by EP did not feel she needed a pacemaker at that time also had a CVA 02/2016 Admit from home 2/8 with significan abd pain multyiple loose BM and found to have Covid Also AKI on admit creat up from basline 1.6--->4 neprhiology consulted and gave opinion She has not required oxygen since admit  diarreha thought to be 2/2 covid   Data Reviewed:  BUN/creatinine baseline 50/4-->65/3.5-->74/3.7-->80/4.08--85/3.8-->87/3.53 Potassium 5.0-->4.5-->4.7-->4.7 Bicarb 20 AST/ALT 63/34-->33/26-->44/26 -->42/38 Hemoglobin baseline usually is 10 range on admission 7.0 currently 9.9  COVID-19 Labs  Recent Labs    02/19/19 0610 02/20/19 0700 02/21/19 1110  DDIMER 3.95* 9.32* 10.53*  FERRITIN 1,336* 2,176* 1,931*  CRP 2.3* 1.5* 0.9    No results found for: [place]:  Coronavirus 19 infection Dimer+ Dopplers which are negative for DVT 2/10 Dimer elevated again for the past 48 hours--recheck for DVT today and dose adjust heparin for risk of DVT Continue remdesivir/Decadron 2/9--2/13 Not candidate for Actemra 2/2 immunosuppressants ?  Diarrheal disease? Diverticular disease vs Covid related diarrhea empiric ceftriaxone and Flagyl --stopped 2/13 --no recurrence--was likely viral assoc with Covid Having  1-2 loose BM daily [employer]ah's Witness Monitor trends-continue iron supplementation-hemoglobin improved AKI-new, baseline between 1.4 and 1.6 creatinine Suspect 2/2 ATN from diarrheal illness preceding admit Renal US shows medical renal disease UA shows >300 protein and mild hemoglobin--Pro/Creat ratio neg FeNa intrinsic renal Neprho input appreciated--stopping NSAID gel and PPI--on d5 + Bicarb at 55 cc/h per Renal   Rheumatoid arthritis Follows with Dr. Arlean Hopping at this time I am holding leflunomide 10 daily abatacept 750 q. 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 Check outpatient TSH Hepatic hypodensity Most recent ultrasound performed of kidneys did not show anything specific on liver May need outpatient characterization of the same  Called and discussed with daughter [phone] and updated 2/14--she understands kidneys are not recovering-we await some improvement but this might need to be followed as OP  Subjective:  Poor appetite-no oxygen requirement--refsued lab eraly today and refused CBG checks and IV line NO cp fever n v diarr   Consultants:   None at this time  Objective: Vitals:   02/21/19 0400 02/21/19 0700 02/21/19 0720 02/21/19 1114  BP: (!) 166/57  (!) 160/65 (!) 155/61  Pulse: 67 (!) 58 (!) 56 92  Resp: 16  18   Temp: 97.9 F (36.6 C)  97.7 F (36.5 C) 97.9 F (36.6 C)  TempSrc: Oral  Oral  Oral  SpO2:  99% 100% 97%  Weight:      Height:        Intake/Output Summary (Last 24 hours) at 02/21/2019  1355 Last data filed at 02/21/2019 1102 Gross per 24 hour  Intake --  Output 1500 ml  Net -1500 ml   Filed Weights   02/14/19 2307 02/14/19 2349  Weight: 81.6 kg 81.6 kg    Examination:  Soft slow speech but awake coherent  cta b no added sound Neuro intact no focal deficit Swollen to grd 2 in LE  ROM intact no focal deficit  Scheduled Meds: . allopurinol  200 mg Oral Daily  . aspirin  81 mg Oral Daily  . atorvastatin  80 mg Oral q1800  . darbepoetin (ARANESP) injection - NON-DIALYSIS  40 mcg Subcutaneous Q Wed-1800  . heparin injection (subcutaneous)  7,500 Units Subcutaneous Q8H  . hydrALAZINE  25 mg Oral Q8H  . insulin aspart  0-15 Units Subcutaneous TID WC  . insulin glargine  8 Units Subcutaneous Daily  . predniSONE  40 mg Oral QAC breakfast  . sodium chloride flush  10-40 mL Intracatheter Q12H  . sodium chloride flush  3 mL Intravenous Q12H   Continuous Infusions: . ferumoxytol 510 mg (02/15/19 1536)  .  sodium bicarbonate  infusion 1000 mL 60 mL/hr at 02/21/19 1100     LOS: 6 days   Time spent: [place], MD [facility]  02/21/2019, 1:55 PM

## 2019-02-21 NOTE — Progress Notes (Signed)
Pt only consumed few bites of ice cream and broccoli. Refused remainder of tray.

## 2019-02-21 NOTE — Progress Notes (Signed)
Inpatient Diabetes Program Recommendations  AACE/ADA: New Consensus Statement on Inpatient Glycemic Control (2015)  Target Ranges:  Prepandial:   less than 140 mg/dL      Peak postprandial:   less than 180 mg/dL (1-2 hours)      Critically ill patients:  140 - 180 mg/dL   Lab Results  Component Value Date   GLUCAP 195 (H) 02/21/2019   HGBA1C 6.6 (H) 02/15/2019    Review of Glycemic Control Results for [name], [name] (MRN [id number]) as of 02/21/2019 09:15  Ref. Range 02/20/2019 04:58 02/20/2019 08:21 02/20/2019 20:34 02/20/2019 22:17 02/21/2019 05:03  Glucose-Capillary Latest Ref Range: 70 - 99 mg/dL 167 (H)  Novolog 2 units 154 (H)  Novolog 2 units + Novolog 3 units given at lunchtime  Lantus 8 units  Decadron 6 mg 347 (H)  Novolog 7 units 404 (H)  Novolog 9 units 195 (H)  Novolog 2 units   Diabetes history: DM 2 Outpatient Diabetes medications: 70/30 6-8 units tid Current orders for Inpatient glycemic control:  Lantus 8 units Daily Novolog 0-15 units tid  PO Prednisone 40 mg Daily (transitioned from decadron yesterday) A1c 6.6 on 2/9 BUN/Creat: 85, 3.80  Inpatient Diabetes Program Recommendations:    Glucose trends increase into the 400 range after meals.  Consider Novolog 2-3 units tid meal coverage if pt eats at least 50% of meals.  Thanks,  Tama Headings RN, MSN, BC-ADM Inpatient Diabetes Coordinator Team Pager [phone] (8a-5p)

## 2019-02-21 NOTE — Progress Notes (Signed)
Nurse Tech came to assist pt with bath and CHG bath. Pt refused. Pt was educated on the importance of CHG baths and getting cleaned up. She verbalized understanding and said "she will do it". Also pt refused to allow RN or NT to clean perform foley care. During her BM she was swiping from back to front. Education was provided but pt became agitated. She refused foley care, daily baths, and CHG. Educated provided on infection prevention. Pt verbalized understanding.

## 2019-02-21 NOTE — Plan of Care (Signed)
  Problem: Education: Goal: Knowledge of risk factors and measures for prevention of condition will improve Outcome: Progressing   

## 2019-02-21 NOTE — Progress Notes (Addendum)
19:30 Updates provided to daughter. Opportunity given to ask questions.  2000: Per prior shift report patient is refusing foley care after multiple attempts. Daughter is aware. 1st attempt made to do foley care-patient still refusing. States "I will do it when I am ready" supplies placed at bedside and encouragement provided.  22:41 : Patient continues to refuse peri+foley care after bowel movement. After constant encouragement patient made 1 attempts to clean foley and peri-area. Continues to refuse CHG bath.   BS 343-patient has freestyle libre-patient refuse finger stick at this time. Recheck at 0000 at 146. MD aware. Will hold off with insulin  3 units for now.   Patient refusing labs at this time. Will reassess .  Lab notified-made attempts-patient continues to refuse.

## 2019-02-21 NOTE — Progress Notes (Signed)
Foley clamped 1240. Will assess in 1 hour.

## 2019-02-21 NOTE — Progress Notes (Signed)
[facility] Progress Note  Subjective: 1200 UOP overnight.  Lost IV access overnight.  Labs pending for today.    Vitals:   02/21/19 0400 02/21/19 0700 02/21/19 0720 02/21/19 1114  BP: (!) 166/57  (!) 160/65 (!) 155/61  Pulse: 67 (!) 58 (!) 56 92  Resp: 16  18   Temp: 97.9 F (36.6 C)  97.7 F (36.5 C) 97.9 F (36.6 C)  TempSrc: Oral  Oral Oral  SpO2:  99% 100% 97%  Weight:      Height:        Exam:  Patient not examined today directly given COVID-19 + status, utilizing data taken from chart +/- discussions w/ providers and staff.    Date               Creat               eGFR  2008- 10         0.8- 1.0  2011- 14         1.0- 1.5  2015- 17         1.1- 1.4  2018               1.35- 1.63  2019               1.33- 1.69  Nov 2020        1.44- 1.61        33- 40 , stage III   Home meds:  - aspirin 81/ atrovastatin 80 qd  - losartan 50 qd  - insulin 70/30 6-8 u tid ac  - pregabalin 75 bid/ hydrocodone qid prn/ aspirin-caffeine tabs prn  - allopurinol 300 hd/ synthroid 200 ug  - abatacept 750 mg IV q 4 weeks/ leflunomide 53m qd     UA 2/8 - >300 protein, 0-5 rbc/ wbc, 5.0, 1.014, cloudy   UNa 39  UCr 71      BP's normal to high 130 - 150's /  47- 77    Renal [place]> IMPRESSION:  8.7cm and 10.4 cm, no hydro, ^'d echo     CXR - no acute disease   Assessment/ Plan: 1. AoCKD 3 - baseline creat 1.4- 1.7 from Nov '20 .  Here creat 4.2 > 3.5 > 4.0 > 3.8.  Suspected due to vol depletion+ CKD+ ARB prior to admit. Renal [place] hydro, +echogenic kidneys. Making urine. DC'd nsaid gel and PPI. Urine prot-cr ratio negative.  Creatinine is slowly improving, labs pending for today.  Will make daily assessment for RRT.    2. HTN - bp's remain high, holding home ARB, will start hydralazine 25 tid, titrate as needed 3. COVID + PNA - on IV decadron and remdesivir, + IV abx, on RA 4. Anemia ckd - Hb 8-10 range, sp darbe 40 ug SQ 2/10 then q wed. Pt is jFara Boroswitness.   5. DM on insulin  6. H/o aortic stenosis    7. H/o RA   EMadelon LipsMD 02/21/2019, 12:36 PM   Recent Labs  Lab 02/15/19 0901 02/15/19 1128 02/16/19 0241 02/17/19 0234 02/19/19 0610 02/20/19 0700  K   < >  --  4.7   < > 4.5 4.7  BUN   < >  --  65*   < > 81* 85*  CREATININE   < >  --  3.52*   < > 3.88* 3.80*  CALCIUM   < >  --  7.9*   < >  7.6* 8.5*  PHOS  --  4.1 3.5  --   --   --    < > = values in this interval not displayed.   Inpatient medications: . allopurinol  200 mg Oral Daily  . aspirin  81 mg Oral Daily  . atorvastatin  80 mg Oral q1800  . darbepoetin (ARANESP) injection - NON-DIALYSIS  40 mcg Subcutaneous Q Wed-1800  . heparin injection (subcutaneous)  7,500 Units Subcutaneous Q8H  . hydrALAZINE  25 mg Oral Q8H  . insulin aspart  0-15 Units Subcutaneous TID WC  . insulin glargine  8 Units Subcutaneous Daily  . predniSONE  40 mg Oral QAC breakfast  . sodium chloride flush  10-40 mL Intracatheter Q12H  . sodium chloride flush  3 mL Intravenous Q12H   . ferumoxytol 510 mg (02/15/19 1536)  .  sodium bicarbonate  infusion 1000 mL 60 mL/hr at 02/21/19 1100   acetaminophen, cyclobenzaprine, HYDROcodone-acetaminophen, menthol-cetylpyridinium, ondansetron **OR** ondansetron (ZOFRAN) IV, sodium chloride flush, sodium chloride flush

## 2019-02-21 NOTE — Progress Notes (Signed)
VAST RN contacted by phone to obtain IV access.  Pt's RN, Odis Hollingshead reported physician requested midline previously. VAST RN attempted IV start, but patient jerked arm away and refused line yesterday.  Pt's nurse requesting VAST come and obtain IV access today as pt's kidney functioning is worsening. She stated that nephrologist is fine with midline placement.  Advised to place consult with name and phone number for return call.

## 2019-02-22 ENCOUNTER — Inpatient Hospital Stay (HOSPITAL_COMMUNITY): Payer: Medicare Other

## 2019-02-22 DIAGNOSIS — I82409 Acute embolism and thrombosis of unspecified deep veins of unspecified lower extremity: Secondary | ICD-10-CM

## 2019-02-22 LAB — COMPREHENSIVE METABOLIC PANEL
ALT: 32 U/L (ref 0–44)
AST: 36 U/L (ref 15–41)
Albumin: 2.3 g/dL — ABNORMAL LOW (ref 3.5–5.0)
Alkaline Phosphatase: 110 U/L (ref 38–126)
Anion gap: 10 (ref 5–15)
BUN: 85 mg/dL — ABNORMAL HIGH (ref 8–23)
CO2: 25 mmol/L (ref 22–32)
Calcium: 7.9 mg/dL — ABNORMAL LOW (ref 8.9–10.3)
Chloride: 102 mmol/L (ref 98–111)
Creatinine, Ser: 3.19 mg/dL — ABNORMAL HIGH (ref 0.44–1.00)
GFR calc Af Amer: 15 mL/min — ABNORMAL LOW (ref >60)
GFR calc non Af Amer: 13 mL/min — ABNORMAL LOW (ref >60)
Glucose, Bld: 254 mg/dL — ABNORMAL HIGH (ref 70–99)
Potassium: 4.8 mmol/L (ref 3.5–5.1)
Sodium: 137 mmol/L (ref 135–145)
Total Bilirubin: 0.7 mg/dL (ref 0.3–1.2)
Total Protein: 5.3 g/dL — ABNORMAL LOW (ref 6.5–8.1)

## 2019-02-22 LAB — GLUCOSE, CAPILLARY
Glucose-Capillary: 226 mg/dL — ABNORMAL HIGH (ref 70–99)
Glucose-Capillary: 214 mg/dL — ABNORMAL HIGH (ref 70–99)
Glucose-Capillary: 244 mg/dL — ABNORMAL HIGH (ref 70–99)
Glucose-Capillary: 328 mg/dL — ABNORMAL HIGH (ref 70–99)

## 2019-02-22 LAB — FERRITIN: Ferritin: 141 ng/mL (ref 11–307)

## 2019-02-22 LAB — C-REACTIVE PROTEIN: CRP: 1 mg/dL — ABNORMAL HIGH (ref <1.0)

## 2019-02-22 LAB — D-DIMER, QUANTITATIVE: D-Dimer, Quant: 10.06 ug/mL-FEU — ABNORMAL HIGH (ref 0.00–0.50)

## 2019-02-22 MED ORDER — PREDNISONE 10 MG PO TABS
10.0000 mg | ORAL_TABLET | Freq: Every day | ORAL | Status: AC
  Administered 2019-02-23 – 2019-02-24 (×2): 10 mg via ORAL
  Filled 2019-02-22 (×2): qty 1

## 2019-02-22 MED ORDER — SODIUM BICARBONATE 8.4 % IV SOLN
INTRAVENOUS | Status: DC
  Filled 2019-02-22 (×3): qty 100

## 2019-02-22 MED ORDER — INSULIN GLARGINE 100 UNIT/ML ~~LOC~~ SOLN
10.0000 [IU] | Freq: Every day | SUBCUTANEOUS | Status: DC
  Administered 2019-02-23 – 2019-02-27 (×5): 10 [IU] via SUBCUTANEOUS
  Filled 2019-02-22 (×5): qty 0.1

## 2019-02-22 NOTE — Progress Notes (Signed)
Physical Therapy Treatment Patient Details Name: SHADIE SWEATMAN MRN: [id number] DOB: [birth year] Today's Date: 02/22/2019    History of Present Illness [name] is a 79 y.o. BF Jehovah's Witness PMHx hypothyroidism, CKD stage III, rheumatoid arthritis, DM type II controlled , chronic anemia, chronic pain, and aortic stenosis, removal intracranial meneigioma. Presenting to the emergency department 02/14/19  for evaluation of lethargy, nausea, vomiting, diarrhea, left lower quadrant abdominal pain, and loss of appetite.  Family called EMS due to patient being essentially bedbound and increasingly lethargic for the past few days.  Household contacts have had COVID-19 recently. Patient positive for Covid virus.    PT Comments    She was in bed with PT and OT arrived. Max encouragement and reassurance were provided and caution to avoid touching her as she tends to say "quit touching me, you scratched me, ETC". She is easily distracted, and difficult to re-direct. She was finally able to perform sit<>stand with RW, and assist of 2 to ambulate to [facility] chair. She is impulsive, and has reduced safety awareness. Often looks around, staring into space- and cues have to be repeated. She does easily get agitated- but tends to get worse in the afternoon. Will continue to monitor and provide PT with current goals  Follow Up Recommendations  Home health PT;Supervision/Assistance - 24 hour     Equipment Recommendations       Recommendations for Other Services       Precautions / Restrictions Precautions Precautions: Fall;Other (comment) Precaution Comments: Increased agitation. Pt refusing care. Cotx with PT as pt is unable to tolerate separate sessions due to behavioral issues. Easily distracted, requires excessive time to process and complete tasks. Restrictions Weight Bearing Restrictions: No    Mobility  Bed Mobility Overal bed mobility: Needs Assistance Bed Mobility: Supine to Sit      Supine to sit: Supervision;HOB elevated Sit to supine: Min assist   General bed mobility comments: Use of bedrail and significant time to complete.   Transfers Overall transfer level: Needs assistance Equipment used: Rolling walker (2 wheeled) Transfers: Sit to/from [facility] Sit to Stand: Min guard;+2 safety/equipment Stand pivot transfers: Min guard;+2 safety/equipment       General transfer comment: Min guard to ensure balance and safety.   Ambulation/Gait Ambulation/Gait assistance: Min assist;+2 physical assistance;+2 safety/equipment Gait Distance (Feet): 9 Feet Assistive device: Rolling walker (2 wheeled) Gait Pattern/deviations: Step-through pattern Gait velocity: decr   General Gait Details: Used RW to assist bed to BS chair- max cues to ambulate today   Stairs             Wheelchair Mobility    Modified Rankin (Stroke Patients Only)       Balance Overall balance assessment: Mild deficits observed, not formally tested Sitting-balance support: Bilateral upper extremity supported;Feet supported Sitting balance-Leahy Scale: Fair     Standing balance support: Bilateral upper extremity supported;During functional activity Standing balance-Leahy Scale: Fair Standing balance comment: reliant on UE                            Cognition Arousal/Alertness: Awake/alert Behavior During Therapy: Agitated;Anxious;Restless Overall Cognitive Status: No family/caregiver present to determine baseline cognitive functioning Area of Impairment: Following commands;Safety/judgement;Awareness;Problem solving                       Following Commands: Follows one step commands inconsistently;Follows one step commands with increased time Safety/Judgement: Decreased awareness of  safety;Decreased awareness of deficits Awareness: Emergent Problem Solving: Slow processing;Decreased initiation;Difficulty sequencing;Requires verbal  cues General Comments: Pt requires significant increase in time to complete all tasks. If you provide encouragement and multiple cues pt becomes more agitated. Activities have to be done on her time.       Exercises Other Exercises Other Exercises: Reminded to do ankle pumps and move LEs as she needed to increase mobility- her reaction was I hurt and do not touch me. Other Exercises: Max cues throughout bed mobility, sit<>stand with RW, and ambulating to BS chair    General Comments General comments (skin integrity, edema, etc.): Pt on room air with SpO2 maintaining in 90s throughout. No reports of SOB.      Pertinent Vitals/Pain Pain Assessment: No/denies pain Pain Score: 4  Faces Pain Scale: Hurts little more Pain Location: All joints Pain Descriptors / Indicators: Aching;Constant Pain Intervention(s): Limited activity within patient's tolerance;Monitored during session;Repositioned    Home Living                      Prior Function            PT Goals (current goals can now be found in the care plan section) Acute Rehab PT Goals Patient Stated Goal: to go home PT Goal Formulation: With patient Time For Goal Achievement: 03/02/19 Potential to Achieve Goals: Fair Progress towards PT goals: (She presents as at possible plateau- difficult to motivate- all activity has to be done "on her time")    Frequency    Min 3X/week      PT Plan Current plan remains appropriate    Co-evaluation PT/OT/SLP Co-Evaluation/Treatment: Yes Reason for Co-Treatment: Necessary to address cognition/behavior during functional activity;For patient/therapist safety PT goals addressed during session: Mobility/safety with mobility OT goals addressed during session: ADL's and self-care      AM-PAC PT "6 Clicks" Mobility   Outcome Measure  Help needed turning from your back to your side while in a flat bed without using bedrails?: A Little Help needed moving from lying on your back  to sitting on the side of a flat bed without using bedrails?: A Little Help needed moving to and from a bed to a chair (including a wheelchair)?: A Little Help needed standing up from a chair using your arms (e.g., wheelchair or bedside chair)?: A Little Help needed to walk in hospital room?: A Lot Help needed climbing 3-5 steps with a railing? : A Lot 6 Click Score: 16    End of Session   Activity Tolerance: Other (comment)(requires long periods of time to process and she is easily distracted and difficult to re-direct.) Patient left: in chair;with call bell/phone within reach;with bed alarm set Nurse Communication: Mobility status PT Visit Diagnosis: Unsteadiness on feet (R26.81);Difficulty in walking, not elsewhere classified (R26.2);Muscle weakness (generalized) (M62.81)     Time: 1130-1146 PT Time Calculation (min) (ACUTE ONLY): 16 min  Charges:  $Therapeutic Activity: 8-22 mins                    Rollen Sox, PT # [phone] CGV cell   Casandra Doffing 02/22/2019, 1:35 PM

## 2019-02-22 NOTE — Progress Notes (Signed)
Patient daughter provided updates.

## 2019-02-22 NOTE — Progress Notes (Signed)
Occupational Therapy Treatment Patient Details Name: [name] MRN: [id number] DOB: [birth year] Today's Date: 02/22/2019    History of present illness [name] is a 79 y.o. BF Jehovah's Witness PMHx hypothyroidism, CKD stage III, rheumatoid arthritis, DM type II controlled , chronic anemia, chronic pain, and aortic stenosis, removal intracranial meneigioma. Presenting to the emergency department 02/14/19  for evaluation of lethargy, nausea, vomiting, diarrhea, left lower quadrant abdominal pain, and loss of appetite.  Family called EMS due to patient being essentially bedbound and increasingly lethargic for the past few days.  Household contacts have had COVID-19 recently. Patient positive for Covid virus.   OT comments  Pt making limited progress in therapy, noting increasing agitation and resistance to activity as hospital stay lengthens. Pt required mod encouragement to participate in therapy tasks this date. Educated pt on importance of daily activity, specifically sitting upright in bedside chair throughout the day with poor understanding. Pt required ~5+ min to sit edge of bed. Did not attempt to assist pt as that increases pt's agitation. Pt sat edge of bed 10+ min with supervision. Pt able to stand pivot to bedside chair with RW and min guard. Noted 0 instances of loss of balance with pt requiring mod cues for safety. Pt becomes more agitated and resistant to activity when more cues are provided and tasks are not done on her time. Pt required significant increase in time to complete all tasks. Pt engaged in simple grooming task while seated in bedside chair. Pt on room air with SpO2 maintaining in 90s throughout. RN updated. OT will continue to follow acutely.    Follow Up Recommendations  Home health OT;Supervision/Assistance - 24 hour    Equipment Recommendations  None recommended by OT    Recommendations for Other Services      Precautions / Restrictions  Precautions Precautions: Fall;Other (comment) Precaution Comments: Increased agitation. Pt refusing care. Cotx with PT as pt is unable to tolerate separate sessions due to behavioral issues. Restrictions Weight Bearing Restrictions: No       Mobility Bed Mobility Overal bed mobility: Needs Assistance Bed Mobility: Supine to Sit     Supine to sit: Supervision;HOB elevated     General bed mobility comments: Use of bedrail and significant time to complete.   Transfers Overall transfer level: Needs assistance Equipment used: Rolling walker (2 wheeled) Transfers: Sit to/from [facility] Sit to Stand: Min guard Stand pivot transfers: Min guard       General transfer comment: Min guard to ensure balance and safety.     Balance Overall balance assessment: Mild deficits observed, not formally tested                                         ADL either performed or assessed with clinical judgement   ADL Overall ADL's : Needs assistance/impaired     Grooming: Supervision/safety;Set up;Wash/dry hands;Wash/dry face;Sitting                               Functional mobility during ADLs: Min guard;Rolling walker General ADL Comments: Pt willing to transfer from bed to chair for lunch. Noted 0 instances of LOB, however pt unsteady on feet. Pt required mod cues for safety due to poor safety awareness and insight into deficits.      Vision  Perception     Praxis      Cognition Arousal/Alertness: Awake/alert Behavior During Therapy: Agitated;Anxious;Restless Overall Cognitive Status: No family/caregiver present to determine baseline cognitive functioning Area of Impairment: Following commands;Safety/judgement;Awareness;Problem solving                       Following Commands: Follows one step commands inconsistently;Follows one step commands with increased time Safety/Judgement: Decreased awareness of  safety;Decreased awareness of deficits Awareness: Emergent Problem Solving: Slow processing;Decreased initiation;Difficulty sequencing;Requires verbal cues General Comments: Pt requires significant increase in time to complete all tasks. If you provide encouragement and multiple cues pt becomes more agitated. Activities have to be done on her time.         Exercises     Shoulder Instructions       General Comments Pt on room air with SpO2 maintaining in 90s throughout. No reports of SOB.    Pertinent Vitals/ Pain       Pain Assessment: Faces Faces Pain Scale: Hurts little more Pain Location: All joints Pain Descriptors / Indicators: Aching;Constant(Pt constantly rubbing joints) Pain Intervention(s): Limited activity within patient's tolerance;Monitored during session;Repositioned  Home Living                                          Prior Functioning/Environment              Frequency           Progress Toward Goals  OT Goals(current goals can now be found in the care plan section)  Progress towards OT goals: Not progressing toward goals - comment(Pt becoming increasingly agitated and resistant)  ADL Goals Pt Will Perform Grooming: with modified independence;standing Pt Will Perform Lower Body Bathing: with modified independence;sit to/from stand Pt Will Perform Lower Body Dressing: with modified independence;sit to/from stand Pt Will Transfer to Toilet: with modified independence;ambulating;regular height toilet Pt Will Perform Toileting - Clothing Manipulation and hygiene: with modified independence;sit to/from stand Additional ADL Goal #1: Pt to tolerate standing up to 10 min with modified independence, in preparation for ADLs. Additional ADL Goal #2: Pt to recall and verbalize 3 fall prevention strategies with min verbal cues.  Plan Discharge plan remains appropriate    Co-evaluation    PT/OT/SLP Co-Evaluation/Treatment: Yes Reason for  Co-Treatment: Necessary to address cognition/behavior during functional activity   OT goals addressed during session: ADL's and self-care      AM-PAC OT "6 Clicks" Daily Activity     Outcome Measure   Help from another person eating meals?: A Little Help from another person taking care of personal grooming?: A Little Help from another person toileting, which includes using toliet, bedpan, or urinal?: A Lot Help from another person bathing (including washing, rinsing, drying)?: A Lot Help from another person to put on and taking off regular upper body clothing?: A Little Help from another person to put on and taking off regular lower body clothing?: A Lot 6 Click Score: 15    End of Session Equipment Utilized During Treatment: Rolling walker  OT Visit Diagnosis: Unsteadiness on feet (R26.81);Muscle weakness (generalized) (M62.81)   Activity Tolerance Patient tolerated treatment well   Patient Left in chair;with call bell/phone within reach;with chair alarm set   Nurse Communication Mobility status        Time: 7846-9629 OT Time Calculation (min): 24 min  Charges: OT General Charges $OT  Visit: 1 Visit OT Treatments $Therapeutic Activity: 8-22 mins  Mauri Brooklyn OTR/L [phone]   Mauri Brooklyn 02/22/2019, 12:58 PM

## 2019-02-22 NOTE — Progress Notes (Signed)
Bilateral lower extremity venous duplex complete. Please see CV Proc tab for preliminary results. Lita Mains- RDMS, RVT 3:07 PM  02/22/2019

## 2019-02-22 NOTE — Progress Notes (Signed)
Hospitalist progress note   Patient from home, Patient going likely home after discharge, Dispo pending at this time improvement of renal function--likely in next several days  [name] [id number] DOB: May 04, 1940 DOA: 02/14/2019  PCP: Jonathon Jordan, MD   Narrative:  79 year old Balta, CKD 4, hypothyroid and is status post thyroidectomy 1990-follows with Dr.Kerr, rheumatoid arthritis HTN, upper extremity DVT 2017, CVA 2018, mild aortic stenosis, DM TY 2, prior hepatic hypodensity 2/5 through 02/12/2018 symptomatic bradycardia, near syncope--- at the hospital stay evaluated and found to have worsening aortic stenosis Patient has been evaluated in the past by EP did not feel she needed a pacemaker at that time also had a CVA 02/2016 Admit from home 2/8 with significan abd pain multyiple loose BM and found to have Covid Also AKI on admit creat up from basline 1.6--->4 neprhiology consulted and gave opinion She has not required oxygen since admit  diarreha thought to be 2/2 covid Nephrology saw and consulted did not feel there was any acute indication for dialysis access placement however her creatinine is stalled and we await either improvement and or decision regarding access placement at this time    Data Reviewed:   Today's labs not available at time of exam despite multiple attempts at reviewing chart   Previous labs BUN/creatinine baseline 50/4-->65/3.5-->74/3.7-->80/4.08--85/3.8-->87/3.53 Potassium 5.0-->4.5-->4.7-->4.7 Bicarb 20 AST/ALT 63/34-->33/26-->44/26 -->42/38 Hemoglobin baseline usually is 10 range on admission 7.0 currently 9.9  COVID-19 Labs  Recent Labs    02/20/19 0700 02/21/19 1110  DDIMER 9.32* 10.53*  FERRITIN 2,176* 1,931*  CRP 1.5* 0.9    No results found for: [place]:  Coronavirus 19 infection Dimer+ Dopplers which are negative for DVT 2/10 Dimer elevated again for the past 48 hours--recheck for DVT today  and dose adjust heparin for risk of DVT Continue remdesivir/Decadron 2/9--2/13 Not candidate for Actemra 2/2 immunosuppressants ?  Diarrheal disease? Diverticular disease vs Covid related diarrhea empiric ceftriaxone and Flagyl --stopped 2/13 --no recurrence--was likely viral assoc with Covid Periodic loose stools but not concerning for infectious etiology Jehovah's Witness Monitor trends-continue iron supplementation-hemoglobin improved and stabilized as of 2/15 AKI-new, baseline between 1.4 and 1.6 creatinine Suspect 2/2 ATN from diarrheal illness preceding admit Renal US shows medical renal disease UA shows >300 protein and mild hemoglobin--Pro/Creat ratio neg FeNa intrinsic renal Neprho input appreciated--stopping NSAID gel and PPI--o continues on bicarb and D5 at 75/h Rheumatoid arthritis Follows with Dr. Arlean Hopping at this time I am holding leflunomide 10 daily abatacept 750 q. 28 daily  Prednisone 40 was given for help with inflammatory pain and this cut back back to 10 mg on 2/16 Upper extremity DVT 2017 Unclear if was ever anticoagulated-only on aspirin 81 mg at home CVA 2018 Aortic Stenosis-severe calcification of valve mean gradient 18 mm AVA 0.8 cm?  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 Check outpatient TSH Hepatic hypodensity Most recent ultrasound performed of kidneys did not show anything specific on liver May need outpatient characterization of the same  Called and discussed with daughter [phone] and updated 2/16--   Subjective:  Continues to have poor appetite is overall quite swollen PICC line in the right side is not  working well She is not eating  that well and intermittently is refusing care refused labs this morning and we were not able to get results earlier today nephrology has seen    Consultants:   None at this time  Objective: Vitals:   02/22/19 0525 02/22/19 0736 02/22/19 1141 02/22/19 1600  BP:   126/79 (!) 150/54  Pulse: (!) 57     Resp:  16 18 18   Temp:  97.6 F (36.4 C) 97.7 F (36.5 C) 98.6 F (37 C)  TempSrc:   Oral Oral  SpO2: 98%     Weight:      Height:        Intake/Output Summary (Last 24 hours) at 02/22/2019 1718 Last data filed at 02/22/2019 1300 Gross per 24 hour  Intake 1580 ml  Output 800 ml  Net 780 ml   Filed Weights   02/14/19 2307 02/14/19 2349 02/22/19 0500  Weight: 81.6 kg 81.6 kg 80 kg    Examination:  Awake coherent but at times uncooperative EOMI NCAT no focal deficit Chest clear no added sound no rales no rhonchi Abdomen soft swollen and lower extremity swollen in upper extremities chest is clear  Scheduled Meds: . allopurinol  200 mg Oral Daily  . aspirin  81 mg Oral Daily  . atorvastatin  80 mg Oral q1800  . darbepoetin (ARANESP) injection - NON-DIALYSIS  40 mcg Subcutaneous Q Wed-1800  . heparin injection (subcutaneous)  7,500 Units Subcutaneous Q8H  . hydrALAZINE  25 mg Oral Q8H  . insulin aspart  0-15 Units Subcutaneous TID WC  . insulin aspart  3 Units Subcutaneous Once  . insulin glargine  8 Units Subcutaneous Daily  . predniSONE  40 mg Oral QAC breakfast  . sodium chloride flush  10-40 mL Intracatheter Q12H  . sodium chloride flush  3 mL Intravenous Q12H   Continuous Infusions: .  sodium bicarbonate  infusion 1000 mL       LOS: 7 days   Time spent: Lake Roberts, MD [facility]  02/22/2019, 5:18 PM

## 2019-02-22 NOTE — Progress Notes (Signed)
[facility] Progress Note  Subjective: about 1L UOP, Cr improving, azotemia up a little.  Refused Foley care last night    Vitals:   02/22/19 0520 02/22/19 0525 02/22/19 0736 02/22/19 1141  BP:    126/79  Pulse: 60 (!) 57    Resp:   16 18  Temp:   97.6 F (36.4 C) 97.7 F (36.5 C)  TempSrc:    Oral  SpO2: 97% 98%    Weight:      Height:        Exam:  Patient not examined today directly given COVID-19 + status, utilizing data taken from chart +/- discussions w/ providers and staff.    Date               Creat               eGFR  2008- 10         0.8- 1.0  2011- 14         1.0- 1.5  2015- 17         1.1- 1.4  2018               1.35- 1.63  2019               1.33- 1.69  Nov 2020        1.44- 1.61        33- 40 , stage III   Home meds:  - aspirin 81/ atrovastatin 80 qd  - losartan 50 qd  - insulin 70/30 6-8 u tid ac  - pregabalin 75 bid/ hydrocodone qid prn/ aspirin-caffeine tabs prn  - allopurinol 300 hd/ synthroid 200 ug  - abatacept 750 mg IV q 4 weeks/ leflunomide 88m qd     UA 2/8 - >300 protein, 0-5 rbc/ wbc, 5.0, 1.014, cloudy   UNa 39  UCr 71      BP's normal to high 130 - 150's /  47- 77    Renal [place]> IMPRESSION:  8.7cm and 10.4 cm, no hydro, ^'d echo     CXR - no acute disease   Assessment/ Plan: 1. AoCKD 3 - baseline creat 1.4- 1.7 from Nov '20 .  Here creat 4.2 > 3.5 > 4.0 > 3.8.  Suspected due to vol depletion+ CKD+ ARB prior to admit. Renal [place] hydro, +echogenic kidneys. Making urine. DC'd nsaid gel and PPI. Urine prot-cr ratio negative.  Creatinine is slowly improving, still with azotemia.  Weights not significantly changed from admit.  May need more IVFs- will increase bicarb gtt to 75/ hr x 12 hrs and reassess.   Will make daily assessment for RRT.    2. HTN -on hydralazine 25 TID, ok 3. COVID + PNA - on IV decadron and remdesivir, + IV abx, on RA 4. Anemia ckd - Hb 8-10 range, sp darbe 40 ug SQ 2/10 then q wed. Pt is jFara Boros witness.  5. DM on insulin  6. H/o aortic stenosis    7. H/o RA   EMadelon LipsMD 02/22/2019, 3:08 PM   Recent Labs  Lab 02/16/19 0241 02/17/19 0234 02/20/19 0700 02/21/19 1110  K 4.7   < > 4.7 4.7  BUN 65*   < > 85* 87*  CREATININE 3.52*   < > 3.80* 3.53*  CALCIUM 7.9*   < > 8.5* 8.2*  PHOS 3.5  --   --   --    < > = values in  this interval not displayed.   Inpatient medications: . allopurinol  200 mg Oral Daily  . aspirin  81 mg Oral Daily  . atorvastatin  80 mg Oral q1800  . darbepoetin (ARANESP) injection - NON-DIALYSIS  40 mcg Subcutaneous Q Wed-1800  . heparin injection (subcutaneous)  7,500 Units Subcutaneous Q8H  . hydrALAZINE  25 mg Oral Q8H  . insulin aspart  0-15 Units Subcutaneous TID WC  . insulin aspart  3 Units Subcutaneous Once  . insulin glargine  8 Units Subcutaneous Daily  . predniSONE  40 mg Oral QAC breakfast  . sodium chloride flush  10-40 mL Intracatheter Q12H  . sodium chloride flush  3 mL Intravenous Q12H   . ferumoxytol 510 mg (02/15/19 1536)  .  sodium bicarbonate  infusion 1000 mL 55 mL/hr at 02/22/19 0602   acetaminophen, cyclobenzaprine, HYDROcodone-acetaminophen, menthol-cetylpyridinium, ondansetron **OR** ondansetron (ZOFRAN) IV, sodium chloride flush, sodium chloride flush

## 2019-02-23 LAB — COMPREHENSIVE METABOLIC PANEL
ALT: 31 U/L (ref 0–44)
AST: 30 U/L (ref 15–41)
Albumin: 2.4 g/dL — ABNORMAL LOW (ref 3.5–5.0)
Alkaline Phosphatase: 102 U/L (ref 38–126)
Anion gap: 10 (ref 5–15)
BUN: 82 mg/dL — ABNORMAL HIGH (ref 8–23)
CO2: 25 mmol/L (ref 22–32)
Calcium: 8.1 mg/dL — ABNORMAL LOW (ref 8.9–10.3)
Chloride: 104 mmol/L (ref 98–111)
Creatinine, Ser: 3.04 mg/dL — ABNORMAL HIGH (ref 0.44–1.00)
GFR calc Af Amer: 16 mL/min — ABNORMAL LOW (ref >60)
GFR calc non Af Amer: 14 mL/min — ABNORMAL LOW (ref >60)
Glucose, Bld: 225 mg/dL — ABNORMAL HIGH (ref 70–99)
Potassium: 3.9 mmol/L (ref 3.5–5.1)
Sodium: 139 mmol/L (ref 135–145)
Total Bilirubin: 0.9 mg/dL (ref 0.3–1.2)
Total Protein: 5.2 g/dL — ABNORMAL LOW (ref 6.5–8.1)

## 2019-02-23 LAB — C-REACTIVE PROTEIN: CRP: 0.8 mg/dL (ref <1.0)

## 2019-02-23 LAB — D-DIMER, QUANTITATIVE: D-Dimer, Quant: 9.37 ug/mL-FEU — ABNORMAL HIGH (ref 0.00–0.50)

## 2019-02-23 LAB — GLUCOSE, CAPILLARY
Glucose-Capillary: 251 mg/dL — ABNORMAL HIGH (ref 70–99)
Glucose-Capillary: 218 mg/dL — ABNORMAL HIGH (ref 70–99)
Glucose-Capillary: 300 mg/dL — ABNORMAL HIGH (ref 70–99)

## 2019-02-23 LAB — FERRITIN: Ferritin: 2083 ng/mL — ABNORMAL HIGH (ref 11–307)

## 2019-02-23 MED ORDER — LEVOTHYROXINE SODIUM 75 MCG PO TABS
150.0000 ug | ORAL_TABLET | Freq: Every day | ORAL | Status: DC
  Administered 2019-02-24 – 2019-02-27 (×4): 150 ug via ORAL
  Filled 2019-02-23 (×4): qty 2

## 2019-02-23 NOTE — Progress Notes (Signed)
Physical Therapy Treatment Patient Details Name: [name] MRN: [id number] DOB: 1940/10/23 Today's Date: 02/23/2019    History of Present Illness [name] is a 79 y.o. BF Jehovah's Witness PMHx hypothyroidism, CKD stage III, rheumatoid arthritis, DM type II controlled , chronic anemia, chronic pain, and aortic stenosis, removal intracranial meneigioma. Presenting to the emergency department 02/14/19  for evaluation of lethargy, nausea, vomiting, diarrhea, left lower quadrant abdominal pain, and loss of appetite.  Family called EMS due to patient being essentially bedbound and increasingly lethargic for the past few days.  Household contacts have had COVID-19 recently. Patient positive for Covid virus.    PT Comments    Plan is to discharge from PT on this date. She has presented with very little to no cooperation on her part. She has been refusing medications , hygiene and all procedures- labs etc. The majority of all PT sessions has been max encouragement and reassurance, with agitation and escalation to near combative on her part. She did reposition self in bed, and PT reminded her of doing LEs exercise to increase strength and mobility= and she states "my legs hurt so I do not need to do this". Reviewed breathing ex and encouraged her to perform these breathing techniques  Frequently to help improve her overall breathing.   Follow Up Recommendations  Home health PT;Supervision/Assistance - 24 hour     Equipment Recommendations       Recommendations for Other Services       Precautions / Restrictions Precautions Precautions: Fall;Other (comment) Precaution Comments: Increased agitation. Pt refusing care. Cotx with PT as pt is unable to tolerate separate sessions due to behavioral issues. Easily distracted, requires excessive time to process and complete tasks. Restrictions Weight Bearing Restrictions: No    Mobility  Bed Mobility Overal bed mobility: Needs  Assistance Bed Mobility: Supine to Sit     Supine to sit: Supervision;HOB elevated Sit to supine: Min assist;+2 for physical assistance   General bed mobility comments: Use of bedrail and significant time to complete.   Transfers Overall transfer level: Needs assistance Equipment used: Rolling walker (2 wheeled) Transfers: Sit to/from [facility] Sit to Stand: Min guard;+2 safety/equipment Stand pivot transfers: Min guard;+2 safety/equipment       General transfer comment: Min guard to ensure balance and safety.   Ambulation/Gait Ambulation/Gait assistance: Min assist;+2 physical assistance;+2 safety/equipment(Did not attempt any ambulation today as she was resistent and becoming agitated- borderline combative)               Stairs             Wheelchair Mobility    Modified Rankin (Stroke Patients Only)       Balance Overall balance assessment: Mild deficits observed, not formally tested Sitting-balance support: Bilateral upper extremity supported;Feet supported Sitting balance-Leahy Scale: Fair Sitting balance - Comments: DId not attempt any transfers out of bed or ambulation today as she became very agitated and resistent to all efforts to help her.                                    Cognition Arousal/Alertness: Awake/alert Behavior During Therapy: Agitated;Anxious;Restless Overall Cognitive Status: No family/caregiver present to determine baseline cognitive functioning Area of Impairment: Following commands;Safety/judgement;Awareness;Problem solving                     Memory: Decreased recall of precautions Following Commands:  Follows one step commands inconsistently;Follows one step commands with increased time     Problem Solving: Slow processing;Decreased initiation;Difficulty sequencing;Requires verbal cues General Comments: Pt requires significant increase in time to complete all tasks. If you provide  encouragement and multiple cues pt becomes more agitated. Activities have to be done on her time.       Exercises Other Exercises Other Exercises: Reminded to do ankle pumps and move LEs as she needed to increase mobility- her reaction was I hurt and do not touch me. Other Exercises: Max cues throughout bed mobility    General Comments General comments (skin integrity, edema, etc.): On RA and able to maintain in mid 90's throughout SPO2- no reports of shortness of breath.      Pertinent Vitals/Pain Pain Score: 5  Faces Pain Scale: Hurts even more Pain Location: All joints Pain Descriptors / Indicators: Aching;Constant    Home Living Family/patient expects to be discharged to:: Private residence Living Arrangements: Spouse/significant other;Children Available Help at Discharge: Family;Available 24 hours/day Type of Home: House Home Access: Stairs to enter            Prior Function            PT Goals (current goals can now be found in the care plan section) Acute Rehab PT Goals Patient Stated Goal: to go home PT Goal Formulation: With patient Time For Goal Achievement: 03/02/19 Potential to Achieve Goals: Fair Progress towards PT goals: Not progressing toward goals - comment    Frequency    (Discharge from PT services)      PT Plan Other (comment)(She is not progressing with PT and the majority of sessions are spent with max cues and little participation on her part)    Co-evaluation              AM-PAC PT "6 Clicks" Mobility   Outcome Measure  Help needed turning from your back to your side while in a flat bed without using bedrails?: A Little Help needed moving from lying on your back to sitting on the side of a flat bed without using bedrails?: A Little Help needed moving to and from a bed to a chair (including a wheelchair)?: A Little Help needed standing up from a chair using your arms (e.g., wheelchair or bedside chair)?: A Little Help needed to  walk in hospital room?: A Lot Help needed climbing 3-5 steps with a railing? : A Lot 6 Click Score: 16    End of Session     Patient left: in bed;with call bell/phone within reach;with bed alarm set         Time: 4401-0272 PT Time Calculation (min) (ACUTE ONLY): 32 min  Charges:  $Therapeutic Exercise: 8-22 mins $Therapeutic Activity: 8-22 mins                  Rollen Sox, PT # [phone] CGV cell   Casandra Doffing 02/23/2019, 6:26 PM

## 2019-02-23 NOTE — Progress Notes (Addendum)
Foley catheter discontinued per protocol.  Bladder scan q8

## 2019-02-23 NOTE — Progress Notes (Signed)
PROGRESS NOTE  [name] ZOX:[id number] DOB: January 20, 1940 DOA: 02/14/2019 PCP: Jonathon Jordan, MD   LOS: 8 days   Brief Narrative / Interim history: 79 year old female who is a Jehovah's Witness, chronic kidney disease stage IV with baseline creatinine 1.4-1.6, hypothyroidism, RA, hypertension, prior DVT in 2017, CVA 2018, aortic stenosis, DM 2, who was admitted to the hospital with abdominal pain as well as diarrhea, found to have be Covid positive and have acute kidney injury with a creatinine of 4.  Nephrology has been consulted & following.  Patient also had an admission in February 2020 with symptomatic bradycardia and near syncope episode, found to have worsening aortic stenosis.  Was also evaluated by EP without recommendations for pacemaker at that time.  Subjective / 24h Interval events: She is doing well, in bed when I entered the room, awake, about to eat breakfast.  She denies any shortness of breath.  She denies any abdominal pain, denies any nausea or vomiting.  Assessment & Plan:  Principal Problem Covid-19 Viral Illness -Completed remdesivir for 5 days while hospitalized, currently she is on steroids with prednisone -D-dimer persistently elevated and underwent lower extremity Dopplers which were negative for DVT on 2/16 -Continue supportive treatment   COVID-19 Labs  Recent Labs    02/21/19 1110 02/22/19 1609  DDIMER 10.53* 10.06*  FERRITIN 1,931* 141  CRP 0.9 1.0*   Active Problems Acute kidney injury on chronic kidney disease stage IV -Suspect ATN due to diarrheal illness -Renal ultrasound showed medical renal disease -Stop NSAIDs, avoid further nephrotoxins -Baseline creatinine 1.4-1.6, was as high as 4 on admission but now improving at 3.1.  Today's labs are pending. -Discussed with Dr. Hollie Salk from nephrology, patient received IV fluids up until today.  He is likely developing fluid overload, hold further fluids and watch her kidney function.  No  diuretics needed just yet.  Diarrhea -Likely viral due to Covid, was initially on empiric ceftriaxone metronidazole which has been stopped on 2/13.  Improved  Chronic anemia in the setting of chronic kidney disease -She is Jehovah's Witness, continue to monitor hemoglobin, currently stable  Rheumatoid arthritis -Outpatient management, hold leflunomide, abatacept -Prednisone taper  Upper extremity DVT in 2017 -Unclear if she ever was anticoagulated, currently on aspirin 81 mg  Prior stroke 2018 -Appears recovered  Severe AS -Makes maintaining an euvolemic fluid status even more complicated, stop further IV fluids for now  DM 2 -Continue Lantus and sliding scale  CBG (last 3)  Recent Labs    02/22/19 1952 02/23/19 0503 02/23/19 0747  GLUCAP 328* 251* 218*   Hypothyroidism -TSH was quite suppressed on 02/15/2019, she is currently off Synthroid entirely, I will discuss with pharmacy but I wonder whether she should be on a lower dose instead of holding it altogether  Hyperlipidemia -continue statin  Hepatic hypodensity -most recent ultrasound performed of kidneys did not show anything specific on liver -may need outpatient characterization of the same   Scheduled Meds: . allopurinol  200 mg Oral Daily  . aspirin  81 mg Oral Daily  . atorvastatin  80 mg Oral q1800  . darbepoetin (ARANESP) injection - NON-DIALYSIS  40 mcg Subcutaneous Q Wed-1800  . heparin injection (subcutaneous)  7,500 Units Subcutaneous Q8H  . hydrALAZINE  25 mg Oral Q8H  . insulin aspart  0-15 Units Subcutaneous TID WC  . insulin aspart  3 Units Subcutaneous Once  . insulin glargine  10 Units Subcutaneous Daily  . predniSONE  10 mg Oral QAC breakfast  .  sodium chloride flush  10-40 mL Intracatheter Q12H  . sodium chloride flush  3 mL Intravenous Q12H   Continuous Infusions: PRN Meds:.acetaminophen, cyclobenzaprine, HYDROcodone-acetaminophen, menthol-cetylpyridinium, ondansetron **OR** ondansetron  (ZOFRAN) IV, sodium chloride flush, sodium chloride flush  DVT prophylaxis: Heparin Code Status: Full code Family Communication: Daughter Ofilia Rayon [phone] Patient admitted from: Home Anticipated d/c place: Home Barriers to d/c: Renal failure, Home when cleared by nephrology  Consultants:  Nephrology  Procedures:  Doppler DVT 2/16 -no evidence of DVT left or right   Microbiology: None  Antibacterials: None    Objective: Vitals:   02/22/19 1600 02/22/19 1910 02/23/19 0505 02/23/19 0749  BP: (!) 150/54   (!) 167/56  Pulse:    (!) 59  Resp: 18 18 19 20   Temp: 98.6 F (37 C) 98 F (36.7 C) 97.8 F (36.6 C) 98.4 F (36.9 C)  TempSrc: Oral Oral Oral Oral  SpO2:    97%  Weight:      Height:        Intake/Output Summary (Last 24 hours) at 02/23/2019 1135 Last data filed at 02/23/2019 0800 Gross per 24 hour  Intake 1560 ml  Output 1230 ml  Net 330 ml   Filed Weights   02/14/19 2307 02/14/19 2349 02/22/19 0500  Weight: 81.6 kg 81.6 kg 80 kg    Examination:  Constitutional: NAD Eyes: no scleral icterus ENMT: Mucous membranes are moist.  Neck: normal, supple Respiratory: clear to auscultation bilaterally, no wheezing, no crackles. Normal respiratory effort.  Cardiovascular: Regular rate and rhythm, no murmurs / rubs / gallops.  Trace LE and UE edema. Good peripheral pulses Abdomen: non distended, no tenderness. Bowel sounds positive.  Musculoskeletal: no clubbing / cyanosis.  Skin: no rashes Neurologic: non focal    Data Reviewed: I have independently reviewed following labs and imaging studies   CBC: Recent Labs  Lab 02/17/19 0234 02/18/19 0050 02/19/19 0610 02/20/19 0700 02/21/19 1110  WBC 3.8* 6.7 5.8 6.4 6.8  NEUTROABS 2.1 4.5 3.7 3.8 4.8  HGB 8.3* 9.1* 8.5* 9.6* 9.9*  HCT 26.4* 28.3* 25.5* 28.1* 28.8*  MCV 89.8 88.7 85.3 84.9 85.0  PLT 179 227 217 262 952   Basic Metabolic Panel: Recent Labs  Lab 02/17/19 0234 02/17/19 0234  02/18/19 0050 02/19/19 0610 02/20/19 0700 02/21/19 1110 02/22/19 1609  NA 139   < > 140 135 142 138 137  K 5.1   < > 5.0 4.5 4.7 4.7 4.8  CL 110   < > 109 103 111 107 102  CO2 17*   < > 18* 22 19* 20* 25  GLUCOSE 174*   < > 179* 471* 157* 204* 254*  BUN 74*   < > 80* 81* 85* 87* 85*  CREATININE 3.72*   < > 4.08* 3.88* 3.80* 3.53* 3.19*  CALCIUM 8.2*   < > 8.6* 7.6* 8.5* 8.2* 7.9*  MG 1.9  --   --   --   --   --   --    < > = values in this interval not displayed.   GFR: Estimated Creatinine Clearance: 14.4 mL/min (A) (by C-G formula based on SCr of 3.19 mg/dL (H)). Liver Function Tests: Recent Labs  Lab 02/18/19 0050 02/19/19 0610 02/20/19 0700 02/21/19 1110 02/22/19 1609  AST 31 44* 77* 42* 36  ALT 24 26 46* 38 32  ALKPHOS 99 97 109 112 110  BILITOT 0.8 0.8 0.6 0.6 0.7  PROT 6.2* 5.2* 5.5* 5.4* 5.3*  ALBUMIN 2.8* 2.2*  2.5* 2.3* 2.3*   No results for input(s): LIPASE, AMYLASE in the last 168 hours. No results for input(s): AMMONIA in the last 168 hours. Coagulation Profile: No results for input(s): INR, PROTIME in the last 168 hours. Cardiac Enzymes: No results for input(s): CKTOTAL, CKMB, CKMBINDEX, TROPONINI in the last 168 hours. BNP (last 3 results) No results for input(s): PROBNP in the last 8760 hours. HbA1C: No results for input(s): HGBA1C in the last 72 hours. CBG: Recent Labs  Lab 02/22/19 0732 02/22/19 1634 02/22/19 1952 02/23/19 0503 02/23/19 0747  GLUCAP 214* 244* 328* 251* 218*   Lipid Profile: No results for input(s): CHOL, HDL, LDLCALC, TRIG, CHOLHDL, LDLDIRECT in the last 72 hours. Thyroid Function Tests: No results for input(s): TSH, T4TOTAL, FREET4, T3FREE, THYROIDAB in the last 72 hours. Anemia Panel: Recent Labs    02/21/19 1110 02/22/19 1609  FERRITIN 1,931* 141   Urine analysis:    Component Value Date/Time   COLORURINE AMBER (A) 02/14/2019 1945   APPEARANCEUR CLOUDY (A) 02/14/2019 1945   LABSPEC 1.014 02/14/2019 1945    LABSPEC 1.030 01/10/2010 1424   PHURINE 5.0 02/14/2019 1945   GLUCOSEU NEGATIVE 02/14/2019 1945   HGBUR SMALL (A) 02/14/2019 1945   BILIRUBINUR NEGATIVE 02/14/2019 1945   BILIRUBINUR not done 01/10/2010 [place] 02/14/2019 1945   PROTEINUR >=300 (A) 02/14/2019 1945   UROBILINOGEN 0.2 09/20/2014 1643   NITRITE NEGATIVE 02/14/2019 1945   LEUKOCYTESUR NEGATIVE 02/14/2019 1945   LEUKOCYTESUR Large 01/10/2010 1424   Sepsis Labs: Invalid input(s): PROCALCITONIN, LACTICIDVEN  Recent Results (from the past 240 hour(s))  Blood Culture (routine x 2)     Status: None   Collection Time: 02/14/19  6:44 PM   Specimen: BLOOD  Result Value Ref Range Status   Specimen Description BLOOD BLOOD LEFT FOREARM  Final   Special Requests   Final    BOTTLES DRAWN AEROBIC ONLY Blood Culture results may not be optimal due to an inadequate volume of blood received in culture bottles   Culture   Final    NO GROWTH 5 DAYS Performed at [facility], [place] [address] [place], [place] [address]    Report Status 02/19/2019 FINAL  Final  Urine culture     Status: None   Collection Time: 02/14/19  7:45 PM   Specimen: In/Out Cath Urine  Result Value Ref Range Status   Specimen Description IN/OUT CATH URINE  Final   Special Requests NONE  Final   Culture   Final    NO GROWTH Performed at [facility], [place] [address] [place], [place] [address]    Report Status 02/16/2019 FINAL  Final  Blood Culture (routine x 2)     Status: None   Collection Time: 02/14/19  9:10 PM   Specimen: BLOOD LEFT FOREARM  Result Value Ref Range Status   Specimen Description BLOOD LEFT FOREARM  Final   Special Requests   Final    BOTTLES DRAWN AEROBIC ONLY Blood Culture adequate volume   Culture   Final    NO GROWTH 5 DAYS Performed at [facility], [place] [address], [place], [place] [address]    Report Status 02/19/2019 FINAL  Final      Radiology Studies: VAS [place] LOWER EXTREMITY VENOUS  (DVT)  Result Date: 02/22/2019  Lower Venous DVTStudy Indications: Hx of DVT.  Limitations: Body habitus and sunlight in the room, uncooperative patient. Performing Technologist: [place], RVT  Examination Guidelines: A  complete evaluation includes B-mode imaging, spectral Doppler, color Doppler, and power Doppler as needed of all accessible portions of each vessel. Bilateral testing is considered an integral part of a complete examination. Limited examinations for reoccurring indications may be performed as noted. The reflux portion of the exam is performed with the patient in reverse Trendelenburg.  +---------+---------------+---------+-----------+----------+-------------------+ RIGHT    CompressibilityPhasicitySpontaneityPropertiesThrombus Aging      +---------+---------------+---------+-----------+----------+-------------------+ CFV      Full           Yes      Yes                                      +---------+---------------+---------+-----------+----------+-------------------+ SFJ      Full                                                             +---------+---------------+---------+-----------+----------+-------------------+ FV Prox  Full                                                             +---------+---------------+---------+-----------+----------+-------------------+ FV Mid   Full                                                             +---------+---------------+---------+-----------+----------+-------------------+ FV DistalFull                                                             +---------+---------------+---------+-----------+----------+-------------------+ PFV      Full                                                             +---------+---------------+---------+-----------+----------+-------------------+ POP                                                   Not visualized       +---------+---------------+---------+-----------+----------+-------------------+ PTV                                                   poor visualization  due to pt                                                                 cooperation and                                                           tolerance.          +---------+---------------+---------+-----------+----------+-------------------+ PERO                                                  poor visualization                                                        due to pt                                                                 cooperation and                                                           tolerance.          +---------+---------------+---------+-----------+----------+-------------------+ GSV      Full                                                             +---------+---------------+---------+-----------+----------+-------------------+   +---------+---------------+---------+-----------+----------+-------------------+ LEFT     CompressibilityPhasicitySpontaneityPropertiesThrombus Aging      +---------+---------------+---------+-----------+----------+-------------------+ CFV      Full           Yes      Yes                                      +---------+---------------+---------+-----------+----------+-------------------+ SFJ      Full                                                             +---------+---------------+---------+-----------+----------+-------------------+ FV Prox  Full                                                             +---------+---------------+---------+-----------+----------+-------------------+ FV Mid   Full                                                             +---------+---------------+---------+-----------+----------+-------------------+ FV  Distal                                             Not visualized      +---------+---------------+---------+-----------+----------+-------------------+ PFV      Full                                                             +---------+---------------+---------+-----------+----------+-------------------+ POP                     Yes      Yes                  unable to compress                                                        due to pt                                                                 cooperation and                                                           tolerance.          +---------+---------------+---------+-----------+----------+-------------------+ PTV                                                   not well visualized +---------+---------------+---------+-----------+----------+-------------------+ PERO                                                  not well visualized +---------+---------------+---------+-----------+----------+-------------------+  GSV      Full                                                             +---------+---------------+---------+-----------+----------+-------------------+     Summary: RIGHT: - There is no evidence of deep vein thrombosis in the lower extremity. However, portions of this examination were limited- see technologist comments above.  - A cystic structure is found in the popliteal fossa.  LEFT: - There is no evidence of deep vein thrombosis in the lower extremity. However, portions of this examination were limited- see technologist comments above.  - No cystic structure found in the popliteal fossa.  *See table(s) above for measurements and observations. Electronically signed by Deitra Mayo MD on 02/22/2019 at 4:12:36 PM.    Final     Marzetta Board, MD, PhD [facility]  Between 7 am - 7 pm I am available, please contact me via Amion or Securechat  Between 7 pm -  7 am I am not available, please contact night coverage MD/APP via Amion

## 2019-02-23 NOTE — Progress Notes (Signed)
[facility] Progress Note  Subjective: Good UOP, IVFs off, Cr improving finally as is BUN.  D/w primary MD--> pt reporting a little more swelling in arms and legs, on RA  Vitals:   02/22/19 1600 02/22/19 1910 02/23/19 0505 02/23/19 0749  BP: (!) 150/54   (!) 167/56  Pulse:    (!) 59  Resp: _0 Temp: 98.6 F (37 C) 98 F (36.7 C) 97.8 F (36.6 C) 98.4 F (36.9 C)  TempSrc: Oral Oral Oral Oral  SpO2:    97%  Weight:      Height:        Exam:  Patient not examined today directly given COVID-19 + status, utilizing data taken from chart +/- discussions w/ providers and staff.    Date               Creat               eGFR  2008- 10         0.8- 1.0  2011- 14         1.0- 1.5  2015- 17         1.1- 1.4  2018               1.35- 1.63  2019               1.33- 1.69  Nov 2020        1.44- 1.61        33- 40 , stage III   Home meds:  - aspirin 81/ atrovastatin 80 qd  - losartan 50 qd  - insulin 70/30 6-8 u tid ac  - pregabalin 75 bid/ hydrocodone qid prn/ aspirin-caffeine tabs prn  - allopurinol 300 hd/ synthroid 200 ug  - abatacept 750 mg IV q 4 weeks/ leflunomide 61m qd     UA 2/8 - >300 protein, 0-5 rbc/ wbc, 5.0, 1.014, cloudy   UNa 39  UCr 71      BP's normal to high 130 - 150's /  47- 77    Renal [place] IMPRESSION:  8.7cm and 10.4 cm, no hydro, ^'d echo     CXR - no acute disease   Assessment/ Plan: 1. AoCKD 3 - baseline creat 1.4- 1.7 from Nov '20 .  Here creat 4.2 > 3.5 > 4.0 > 3.8.  Suspected due to vol depletion+ CKD+ ARB prior to admit. Renal [place] hydro, +echogenic kidneys. Making urine. DC'd nsaid gel and PPI. Urine prot-cr ratio negative.  Creatinine is slowly improving, still with azotemia.  Weights not significantly changed from admit.  Think pt is vol replete, does not need more IVFs, they have expired and will not restart.  Would hold diuretics and would assess for need for tomorrow    2. HTN -on hydralazine 25 TID, slightly  higher pressures today 3. COVID + PNA - on IV decadron and remdesivir, + IV abx, on RA 4. Anemia ckd - Hb 8-10 range, sp darbe 40 ug SQ 2/10 then q wed. Pt is jFara Boroswitness.  5. DM on insulin  6. H/o aortic stenosis    7. H/o RA   EMadelon LipsMD 02/23/2019, 3:57 PM   Recent Labs  Lab 02/22/19 1609 02/23/19 1258  K 4.8 3.9  BUN 85* 82*  CREATININE 3.19* 3.04*  CALCIUM 7.9* 8.1*   Inpatient medications: . allopurinol  200 mg Oral Daily  . aspirin  81 mg Oral Daily  .  atorvastatin  80 mg Oral q1800  . darbepoetin (ARANESP) injection - NON-DIALYSIS  40 mcg Subcutaneous Q Wed-1800  . heparin injection (subcutaneous)  7,500 Units Subcutaneous Q8H  . hydrALAZINE  25 mg Oral Q8H  . insulin aspart  0-15 Units Subcutaneous TID WC  . insulin aspart  3 Units Subcutaneous Once  . insulin glargine  10 Units Subcutaneous Daily  . [START ON 02/24/2019] levothyroxine  150 mcg Oral QAC breakfast  . predniSONE  10 mg Oral QAC breakfast  . sodium chloride flush  10-40 mL Intracatheter Q12H  . sodium chloride flush  3 mL Intravenous Q12H    acetaminophen, cyclobenzaprine, HYDROcodone-acetaminophen, menthol-cetylpyridinium, ondansetron **OR** ondansetron (ZOFRAN) IV, sodium chloride flush, sodium chloride flush

## 2019-02-24 LAB — CBC
HCT: 24.3 % — ABNORMAL LOW (ref 36.0–46.0)
Hemoglobin: 8.2 g/dL — ABNORMAL LOW (ref 12.0–15.0)
MCH: 29.1 pg (ref 26.0–34.0)
MCHC: 33.7 g/dL (ref 30.0–36.0)
MCV: 86.2 fL (ref 80.0–100.0)
Platelets: 271 10*3/uL (ref 150–400)
RBC: 2.82 MIL/uL — ABNORMAL LOW (ref 3.87–5.11)
RDW: 15.5 % (ref 11.5–15.5)
WBC: 9 10*3/uL (ref 4.0–10.5)
nRBC: 1.9 % — ABNORMAL HIGH (ref 0.0–0.2)

## 2019-02-24 LAB — BASIC METABOLIC PANEL
Anion gap: 7 (ref 5–15)
BUN: 63 mg/dL — ABNORMAL HIGH (ref 8–23)
CO2: 21 mmol/L — ABNORMAL LOW (ref 22–32)
Calcium: 6.5 mg/dL — ABNORMAL LOW (ref 8.9–10.3)
Chloride: 115 mmol/L — ABNORMAL HIGH (ref 98–111)
Creatinine, Ser: 2.27 mg/dL — ABNORMAL HIGH (ref 0.44–1.00)
GFR calc Af Amer: 23 mL/min — ABNORMAL LOW (ref >60)
GFR calc non Af Amer: 20 mL/min — ABNORMAL LOW (ref >60)
Glucose, Bld: 106 mg/dL — ABNORMAL HIGH (ref 70–99)
Potassium: 3.3 mmol/L — ABNORMAL LOW (ref 3.5–5.1)
Sodium: 143 mmol/L (ref 135–145)

## 2019-02-24 LAB — D-DIMER, QUANTITATIVE: D-Dimer, Quant: 3.65 ug{FEU}/mL — ABNORMAL HIGH (ref 0.00–0.50)

## 2019-02-24 LAB — FERRITIN: Ferritin: 1193 ng/mL — ABNORMAL HIGH (ref 11–307)

## 2019-02-24 LAB — GLUCOSE, CAPILLARY
Glucose-Capillary: 126 mg/dL — ABNORMAL HIGH (ref 70–99)
Glucose-Capillary: 245 mg/dL — ABNORMAL HIGH (ref 70–99)

## 2019-02-24 LAB — C-REACTIVE PROTEIN: CRP: <0.5 mg/dL (ref <1.0)

## 2019-02-24 MED ORDER — POTASSIUM CHLORIDE CRYS ER 20 MEQ PO TBCR
30.0000 meq | EXTENDED_RELEASE_TABLET | Freq: Once | ORAL | Status: AC
  Administered 2019-02-24: 30 meq via ORAL
  Filled 2019-02-24: qty 2

## 2019-02-24 MED ORDER — HEPARIN SODIUM (PORCINE) 5000 UNIT/ML IJ SOLN
5000.0000 [IU] | Freq: Three times a day (TID) | INTRAMUSCULAR | Status: DC
  Administered 2019-02-24 – 2019-02-26 (×8): 5000 [IU] via SUBCUTANEOUS
  Filled 2019-02-24 (×9): qty 1

## 2019-02-24 NOTE — Progress Notes (Signed)
PROGRESS NOTE  [name] SHF:[id number] DOB: August 11, 1940 DOA: 02/14/2019 PCP: Jonathon Jordan, MD   LOS: 9 days   Brief Narrative / Interim history: 79 year old female who is a Jehovah's Witness, chronic kidney disease stage IV with baseline creatinine 1.4-1.6, hypothyroidism, RA, hypertension, prior DVT in 2017, CVA 2018, aortic stenosis, DM 2, who was admitted to the hospital with abdominal pain as well as diarrhea, found to have be Covid positive and have acute kidney injury with a creatinine of 4.  Nephrology has been consulted & following.  Patient also had an admission in February 2020 with symptomatic bradycardia and near syncope episode, found to have worsening aortic stenosis.  Was also evaluated by EP without recommendations for pacemaker at that time.  Subjective / 24h Interval events: No complaints this morning.  Still appreciates some swelling in her arms and legs, breathing comfortably, no shortness of breath  Assessment & Plan:  Principal Problem Covid-19 Viral Illness -Completed remdesivir for 5 days while hospitalized, currently she is on steroids with prednisone, last day 2/18 -D-dimer persistently elevated and underwent lower extremity Dopplers which were negative for DVT on 2/16.  D-dimer not improving -Continue supportive treatment   COVID-19 Labs  Recent Labs    02/22/19 1609 02/23/19 1041 02/23/19 1258 02/24/19 0800  DDIMER 10.06* 9.37*  --  3.65*  FERRITIN 141  --  2,083* 1,193*  CRP 1.0*  --  0.8 <0.5   Active Problems Acute kidney injury on chronic kidney disease stage IV -Suspect ATN due to diarrheal illness -Renal ultrasound showed medical renal disease -Stop NSAIDs, avoid further nephrotoxins -Baseline creatinine 1.4-1.6, was as high as 4 on admission, now improving and is 2.2 this morning -She received IV fluids up until 2/17, no monitoring.  Further management per nephrology, will be discharged home once stable from renal  standpoint  Diarrhea -Likely viral due to Covid, was initially on empiric ceftriaxone metronidazole which has been stopped on 2/13.  Improved  Chronic anemia in the setting of chronic kidney disease -She is Jehovah's Witness, her hemoglobin has remained overall stable, minimize further blood work to renal function only  Rheumatoid arthritis -Outpatient management, hold leflunomide, abatacept -Prednisone taper  Upper extremity DVT in 2017 -Unclear if she ever was anticoagulated, currently on aspirin 81 mg  Prior stroke 2018 -Appears recovered  Severe AS -Makes maintaining an euvolemic fluid status even more complicated but stable now  DM 2 -Continue Lantus and sliding scale  CBG (last 3)  Recent Labs    02/23/19 0747 02/23/19 1957 02/24/19 0756  GLUCAP 218* 300* 126*   Hypothyroidism -TSH was quite suppressed on 02/15/2019, she is currently off Synthroid entirely, I will discuss with pharmacy but I wonder whether she should be on a lower dose instead of holding it altogether.  Started back on 150 MCG  Hyperlipidemia -continue statin  Hepatic hypodensity -most recent ultrasound performed of kidneys did not show anything specific on liver -may need outpatient characterization of the same   Scheduled Meds: . allopurinol  200 mg Oral Daily  . aspirin  81 mg Oral Daily  . atorvastatin  80 mg Oral q1800  . darbepoetin (ARANESP) injection - NON-DIALYSIS  40 mcg Subcutaneous Q Wed-1800  . heparin injection (subcutaneous)  7,500 Units Subcutaneous Q8H  . hydrALAZINE  25 mg Oral Q8H  . insulin aspart  0-15 Units Subcutaneous TID WC  . insulin aspart  3 Units Subcutaneous Once  . insulin glargine  10 Units Subcutaneous Daily  . levothyroxine  150 mcg Oral QAC breakfast  . sodium chloride flush  10-40 mL Intracatheter Q12H  . sodium chloride flush  3 mL Intravenous Q12H   Continuous Infusions: PRN Meds:.acetaminophen, cyclobenzaprine, HYDROcodone-acetaminophen,  menthol-cetylpyridinium, ondansetron **OR** ondansetron (ZOFRAN) IV, sodium chloride flush, sodium chloride flush  DVT prophylaxis: Heparin Code Status: Full code Family Communication: Daughter Murl Zogg [phone] Patient admitted from: Home Anticipated d/c place: Home Barriers to d/c: Renal failure, Home when cleared by nephrology  Consultants:  Nephrology  Procedures:  Doppler DVT 2/16 -no evidence of DVT left or right   Microbiology: None  Antibacterials: None    Objective: Vitals:   02/24/19 0406 02/24/19 0435 02/24/19 0500 02/24/19 0758  BP: (!) 131/43  (!) 166/51 (!) 164/63  Pulse: 62  68 (!) 58  Resp: 18   19  Temp: 98 F (36.7 C)  98.2 F (36.8 C) 98.3 F (36.8 C)  TempSrc: Oral  Oral Oral  SpO2: 98%   99%  Weight:  79 kg    Height:        Intake/Output Summary (Last 24 hours) at 02/24/2019 1035 Last data filed at 02/24/2019 6962 Gross per 24 hour  Intake 805 ml  Output 1375 ml  Net -570 ml   Filed Weights   02/14/19 2349 02/22/19 0500 02/24/19 0435  Weight: 81.6 kg 80 kg 79 kg    Examination:  Constitutional: No distress Eyes: No icterus ENMT: Moist mucous membranes Neck: normal, supple Respiratory: Clear bilaterally, no wheezing, normal respiratory effort Cardiovascular: Regular rate and rhythm, no murmurs, trace edema upper and lower extremities Abdomen: Soft, nontender, nondistended, bowel sounds positive Musculoskeletal: no clubbing / cyanosis.  Skin: No rashes seen Neurologic: non focal    Data Reviewed: I have independently reviewed following labs and imaging studies   CBC: Recent Labs  Lab 02/18/19 0050 02/19/19 0610 02/20/19 0700 02/21/19 1110 02/24/19 0800  WBC 6.7 5.8 6.4 6.8 9.0  NEUTROABS 4.5 3.7 3.8 4.8  --   HGB 9.1* 8.5* 9.6* 9.9* 8.2*  HCT 28.3* 25.5* 28.1* 28.8* 24.3*  MCV 88.7 85.3 84.9 85.0 86.2  PLT 227 217 262 285 952   Basic Metabolic Panel: Recent Labs  Lab 02/20/19 0700 02/21/19 1110  02/22/19 1609 02/23/19 1258 02/24/19 0800  NA 142 138 137 139 143  K 4.7 4.7 4.8 3.9 3.3*  CL 111 107 102 104 115*  CO2 19* 20* 25 25 21*  GLUCOSE 157* 204* 254* 225* 106*  BUN 85* 87* 85* 82* 63*  CREATININE 3.80* 3.53* 3.19* 3.04* 2.27*  CALCIUM 8.5* 8.2* 7.9* 8.1* 6.5*   GFR: Estimated Creatinine Clearance: 20.1 mL/min (A) (by C-G formula based on SCr of 2.27 mg/dL (H)). Liver Function Tests: Recent Labs  Lab 02/19/19 0610 02/20/19 0700 02/21/19 1110 02/22/19 1609 02/23/19 1258  AST 44* 77* 42* 36 30  ALT 26 46* 38 32 31  ALKPHOS 97 109 112 110 102  BILITOT 0.8 0.6 0.6 0.7 0.9  PROT 5.2* 5.5* 5.4* 5.3* 5.2*  ALBUMIN 2.2* 2.5* 2.3* 2.3* 2.4*   No results for input(s): LIPASE, AMYLASE in the last 168 hours. No results for input(s): AMMONIA in the last 168 hours. Coagulation Profile: No results for input(s): INR, PROTIME in the last 168 hours. Cardiac Enzymes: No results for input(s): CKTOTAL, CKMB, CKMBINDEX, TROPONINI in the last 168 hours. BNP (last 3 results) No results for input(s): PROBNP in the last 8760 hours. HbA1C: No results for input(s): HGBA1C in the last 72 hours. CBG: Recent Labs  Lab 02/22/19 1952 02/23/19 0503 02/23/19 0747 02/23/19 1957 02/24/19 0756  GLUCAP 328* 251* 218* 300* 126*   Lipid Profile: No results for input(s): CHOL, HDL, LDLCALC, TRIG, CHOLHDL, LDLDIRECT in the last 72 hours. Thyroid Function Tests: No results for input(s): TSH, T4TOTAL, FREET4, T3FREE, THYROIDAB in the last 72 hours. Anemia Panel: Recent Labs    02/23/19 1258 02/24/19 0800  FERRITIN 2,083* 1,193*   Urine analysis:    Component Value Date/Time   COLORURINE AMBER (A) 02/14/2019 1945   APPEARANCEUR CLOUDY (A) 02/14/2019 1945   LABSPEC 1.014 02/14/2019 1945   LABSPEC 1.030 01/10/2010 1424   PHURINE 5.0 02/14/2019 1945   GLUCOSEU NEGATIVE 02/14/2019 1945   HGBUR SMALL (A) 02/14/2019 1945   BILIRUBINUR NEGATIVE 02/14/2019 1945   BILIRUBINUR not done  01/10/2010 1424   [place] 02/14/2019 1945   PROTEINUR >=300 (A) 02/14/2019 1945   UROBILINOGEN 0.2 09/20/2014 1643   NITRITE NEGATIVE 02/14/2019 1945   LEUKOCYTESUR NEGATIVE 02/14/2019 1945   LEUKOCYTESUR Large 01/10/2010 1424   Sepsis Labs: Invalid input(s): PROCALCITONIN, LACTICIDVEN  Recent Results (from the past 240 hour(s))  Blood Culture (routine x 2)     Status: None   Collection Time: 02/14/19  6:44 PM   Specimen: BLOOD  Result Value Ref Range Status   Specimen Description BLOOD BLOOD LEFT FOREARM  Final   Special Requests   Final    BOTTLES DRAWN AEROBIC ONLY Blood Culture results may not be optimal due to an inadequate volume of blood received in culture bottles   Culture   Final    NO GROWTH 5 DAYS Performed at [facility], [place] [address], [place], [place] [address]    Report Status 02/19/2019 FINAL  Final  Urine culture     Status: None   Collection Time: 02/14/19  7:45 PM   Specimen: In/Out Cath Urine  Result Value Ref Range Status   Specimen Description IN/OUT CATH URINE  Final   Special Requests NONE  Final   Culture   Final    NO GROWTH Performed at [facility], [place] [address], [place], [place] [address]    Report Status 02/16/2019 FINAL  Final  Blood Culture (routine x 2)     Status: None   Collection Time: 02/14/19  9:10 PM   Specimen: BLOOD LEFT FOREARM  Result Value Ref Range Status   Specimen Description BLOOD LEFT FOREARM  Final   Special Requests   Final    BOTTLES DRAWN AEROBIC ONLY Blood Culture adequate volume   Culture   Final    NO GROWTH 5 DAYS Performed at [facility], [place] [address] [place], [place] [address]    Report Status 02/19/2019 FINAL  Final      Radiology Studies: VAS [place] LOWER EXTREMITY VENOUS (DVT)  Result Date: 02/22/2019  Lower Venous DVTStudy Indications: Hx of DVT.  Limitations: Body habitus and sunlight in the room, uncooperative patient. Performing Technologist: Antonieta Pert RDMS,  RVT  Examination Guidelines: A complete evaluation includes B-mode imaging, spectral Doppler, color Doppler, and power Doppler as needed of all accessible portions of each vessel. Bilateral testing is considered an integral part of a complete examination. Limited examinations for reoccurring indications may be performed as noted. The reflux portion of the exam is performed with the patient in reverse Trendelenburg.  +---------+---------------+---------+-----------+----------+-------------------+ RIGHT    CompressibilityPhasicitySpontaneityPropertiesThrombus Aging      +---------+---------------+---------+-----------+----------+-------------------+ CFV      Full  Yes      Yes                                      +---------+---------------+---------+-----------+----------+-------------------+ SFJ      Full                                                             +---------+---------------+---------+-----------+----------+-------------------+ FV Prox  Full                                                             +---------+---------------+---------+-----------+----------+-------------------+ FV Mid   Full                                                             +---------+---------------+---------+-----------+----------+-------------------+ FV DistalFull                                                             +---------+---------------+---------+-----------+----------+-------------------+ PFV      Full                                                             +---------+---------------+---------+-----------+----------+-------------------+ POP                                                   Not visualized      +---------+---------------+---------+-----------+----------+-------------------+ PTV                                                   poor visualization                                                        due to pt  cooperation and                                                           tolerance.          +---------+---------------+---------+-----------+----------+-------------------+ PERO                                                  poor visualization                                                        due to pt                                                                 cooperation and                                                           tolerance.          +---------+---------------+---------+-----------+----------+-------------------+ GSV      Full                                                             +---------+---------------+---------+-----------+----------+-------------------+   +---------+---------------+---------+-----------+----------+-------------------+ LEFT     CompressibilityPhasicitySpontaneityPropertiesThrombus Aging      +---------+---------------+---------+-----------+----------+-------------------+ CFV      Full           Yes      Yes                                      +---------+---------------+---------+-----------+----------+-------------------+ SFJ      Full                                                             +---------+---------------+---------+-----------+----------+-------------------+ FV Prox  Full                                                             +---------+---------------+---------+-----------+----------+-------------------+ FV Mid   Full                                                             +---------+---------------+---------+-----------+----------+-------------------+  FV Distal                                             Not visualized      +---------+---------------+---------+-----------+----------+-------------------+ PFV      Full                                                              +---------+---------------+---------+-----------+----------+-------------------+ POP                     Yes      Yes                  unable to compress                                                        due to pt                                                                 cooperation and                                                           tolerance.          +---------+---------------+---------+-----------+----------+-------------------+ PTV                                                   not well visualized +---------+---------------+---------+-----------+----------+-------------------+ PERO                                                  not well visualized +---------+---------------+---------+-----------+----------+-------------------+ GSV      Full                                                             +---------+---------------+---------+-----------+----------+-------------------+     Summary: RIGHT: - There is no evidence of deep vein thrombosis in the lower extremity. However, portions of this examination were limited- see technologist comments above.  - A cystic structure is found in the popliteal fossa.  LEFT: - There is no evidence of deep vein thrombosis in the lower extremity. However, portions of  this examination were limited- see technologist comments above.  - No cystic structure found in the popliteal fossa.  *See table(s) above for measurements and observations. Electronically signed by Deitra Mayo MD on 02/22/2019 at 4:12:36 PM.    Final     Marzetta Board, MD, PhD [facility]  Between 7 am - 7 pm I am available, please contact me via Amion or Securechat  Between 7 pm - 7 am I am not available, please contact night coverage MD/APP via Amion

## 2019-02-24 NOTE — Progress Notes (Signed)
Occupational Therapy Treatment Patient Details Name: [name] MRN: [id number] DOB: October 21, 1940 Today's Date: 02/24/2019    History of present illness ORTHA METTS is a 79 y.o. BF Jehovah's Witness PMHx hypothyroidism, CKD stage III, rheumatoid arthritis, DM type II controlled , chronic anemia, chronic pain, and aortic stenosis, removal intracranial meneigioma. Presenting to the emergency department 02/14/19  for evaluation of lethargy, nausea, vomiting, diarrhea, left lower quadrant abdominal pain, and loss of appetite.  Family called EMS due to patient being essentially bedbound and increasingly lethargic for the past few days.  Household contacts have had COVID-19 recently. Patient positive for Covid virus.   OT comments  Pt making very limited progress in therapy, continuing to require mod to max encouragement to engage in out of bed tasks. Again provided education regarding importance of daily activity, specifically sitting in bedside chair throughout the day as pt has been refusing to get up with nursing. Pt transferred to edge of bed with supervision and use of bedrails. Pt tolerated sitting edge of bed ~10+ min with supervision. Educated/instructed pt on safety strategies and body positioning with RW to increase balance, safety, and prevent future falls with poor understanding and follow through. Pt transferred to bedside chair with variable supervision to min guard. Noted 0 instances of loss of balance. Pt on room air with SpO2 maintaining in 90s throughout. Pt continues to require significant increase in time to complete all tasks due to self-limiting behavior. Pt becomes increasingly more resistant to movement when more cues are provided. RN updated. OT will continue to follow acutely.    Follow Up Recommendations  Home health OT;Supervision/Assistance - 24 hour    Equipment Recommendations  None recommended by OT    Recommendations for Other Services      Precautions /  Restrictions Precautions Precautions: Fall;Other (comment) Precaution Comments: Pt continues to require mod to max encouragement to participate in therapy.  Restrictions Weight Bearing Restrictions: No       Mobility Bed Mobility Overal bed mobility: Needs Assistance Bed Mobility: Supine to Sit     Supine to sit: Supervision;HOB elevated     General bed mobility comments: Use of bedrail and significant time to complete.   Transfers Overall transfer level: Needs assistance Equipment used: Rolling walker (2 wheeled) Transfers: Sit to/from [facility] Sit to Stand: Supervision;Min guard Stand pivot transfers: Supervision;Min guard       General transfer comment: Assist to ensure balance and safety    Balance Overall balance assessment: Mild deficits observed, not formally tested                                         ADL either performed or assessed with clinical judgement   ADL Overall ADL's : Needs assistance/impaired     Grooming: Supervision/safety;Set up;Wash/dry hands;Wash/dry face;Sitting                                 General ADL Comments: Pt willing to transfer from bed to chair for lunch. Noted 0 instances of LOB, however pt unsteady on feet. Pt required mod cues for safety due to poor safety awareness and insight into deficits.      Vision       Perception     Praxis      Cognition Arousal/Alertness: Awake/alert Behavior During Therapy: Flat  affect Overall Cognitive Status: No family/caregiver present to determine baseline cognitive functioning                                 General Comments: Pt requires significant increase in time to complete all tasks. If you provide encouragement and multiple cues pt becomes more agitated. Activities have to be done on her time.         Exercises     Shoulder Instructions       General Comments Pt on room air with SpO2 maintaining in 90s  throughout.     Pertinent Vitals/ Pain       Pain Assessment: Faces Faces Pain Scale: Hurts little more Pain Location: All joints Pain Descriptors / Indicators: Aching;Constant Pain Intervention(s): Monitored during session;Repositioned  Home Living                                          Prior Functioning/Environment              Frequency           Progress Toward Goals  OT Goals(current goals can now be found in the care plan section)  Progress towards OT goals: Progressing toward goals(Minimal progress due to self-limiting behavior)  ADL Goals Pt Will Perform Grooming: with modified independence;standing Pt Will Perform Lower Body Bathing: with modified independence;sit to/from stand Pt Will Perform Lower Body Dressing: with modified independence;sit to/from stand Pt Will Transfer to Toilet: with modified independence;ambulating;regular height toilet Pt Will Perform Toileting - Clothing Manipulation and hygiene: with modified independence;sit to/from stand Additional ADL Goal #1: Pt to tolerate standing up to 10 min with modified independence, in preparation for ADLs. Additional ADL Goal #2: Pt to recall and verbalize 3 fall prevention strategies with min verbal cues.  Plan Discharge plan remains appropriate    Co-evaluation                 AM-PAC OT "6 Clicks" Daily Activity     Outcome Measure   Help from another person eating meals?: A Little Help from another person taking care of personal grooming?: A Little Help from another person toileting, which includes using toliet, bedpan, or urinal?: A Lot Help from another person bathing (including washing, rinsing, drying)?: A Lot Help from another person to put on and taking off regular upper body clothing?: A Little Help from another person to put on and taking off regular lower body clothing?: A Lot 6 Click Score: 15    End of Session Equipment Utilized During Treatment: Rolling  walker  OT Visit Diagnosis: Unsteadiness on feet (R26.81);Muscle weakness (generalized) (M62.81)   Activity Tolerance Patient tolerated treatment well   Patient Left in chair;with call bell/phone within reach;with chair alarm set   Nurse Communication Mobility status        Time: 0045-9977 OT Time Calculation (min): 25 min  Charges: OT General Charges $OT Visit: 1 Visit OT Treatments $Therapeutic Activity: 23-37 mins  Mauri Brooklyn OTR/L [phone]   Mauri Brooklyn 02/24/2019, 1:20 PM

## 2019-02-24 NOTE — Progress Notes (Signed)
[facility] Progress Note   Home meds: - aspirin 81/ atrovastatin 80 qd - losartan 50 qd - insulin 70/30 6-8 u tid ac - pregabalin 75 bid/ hydrocodone qid prn/ aspirin-caffeine tabs prn - allopurinol 300 hd/ synthroid 200 ug - abatacept 750 mg IV q 4 weeks/ leflunomide 10mg  qd  UA 2/8 - >300 protein, 0-5 rbc/ wbc, 5.0, 1.014, cloudy UNa 39 UCr 71  BP's normal to high 130 - 150's / 47- 77 Renal US >IMPRESSION:8.7cm and 10.4 cm, no hydro, ^'d echo CXR - no acute disease  Assessment/ Plan:   1. AoCKD 3 - baseline creat 1.4- 1.7 from Nov '20 . Here creat 4.2 > 3.5 > 4.0 > 3.8. Suspected due to vol depletion+ CKD+ ARB prior to admit. Renal US no hydro, +echogenic kidneys.Making urine. DC'd nsaid gel and PPI. Urine prot-cr ratio negative.  Creatinine continues to improve.  As long as pt continues to be on RA, net neg I&O's with good UOP as she currently has been OK to hold off on diuretics.  We will sign off at this time; please reconsult as needed.  2. HTN -on hydralazine 25 TID 3. COVID + PNA - on IV decadron and remdesivir, + IV abx, on RA 4. Anemia ckd - Hb 8-10 range, sp darbe 40 ug SQ 2/10 then q wed. Pt is [name] witness.  5. DM on insulin  6. H/o aortic stenosis 7. H/o RA  Subjective:   Feels better, denies cough/ fever/ chills/rigors or dyspnea.   Objective:   BP (!) 164/63 (BP Location: Right Arm)   Pulse (!) 58   Temp 98.3 F (36.8 C) (Oral)   Resp 19   Ht 5\' 4"  (1.626 m)   Wt 79 kg   LMP  (LMP Unknown)   SpO2 99%   BMI 29.90 kg/m   Intake/Output Summary (Last 24 hours) at 02/24/2019 1155 Last data filed at 02/24/2019 6270 Gross per 24 hour  Intake 805 ml  Output 1375 ml  Net -570 ml   Weight change:   Physical Exam: GEN: NAD, pleasant HEENT: No conjunctival pallor, EOMI NECK: Supple, no thyromegaly LUNGS: CTA B/L no rales CV: RRR, No M/R/G ABD: SNDNT +BS  EXT: Trace  lower extremity  edema    Imaging: VAS [place] LOWER EXTREMITY VENOUS (DVT)  Result Date: 02/22/2019  Lower Venous DVTStudy Indications: Hx of DVT.  Limitations: Body habitus and sunlight in the room, uncooperative patient. Performing Technologist: Antonieta Pert RDMS, RVT  Examination Guidelines: A complete evaluation includes B-mode imaging, spectral Doppler, color Doppler, and power Doppler as needed of all accessible portions of each vessel. Bilateral testing is considered an integral part of a complete examination. Limited examinations for reoccurring indications may be performed as noted. The reflux portion of the exam is performed with the patient in reverse Trendelenburg.  +---------+---------------+---------+-----------+----------+-------------------+ RIGHT    CompressibilityPhasicitySpontaneityPropertiesThrombus Aging      +---------+---------------+---------+-----------+----------+-------------------+ CFV      Full           Yes      Yes                                      +---------+---------------+---------+-----------+----------+-------------------+ SFJ      Full                                                             +---------+---------------+---------+-----------+----------+-------------------+  FV Prox  Full                                                             +---------+---------------+---------+-----------+----------+-------------------+ FV Mid   Full                                                             +---------+---------------+---------+-----------+----------+-------------------+ FV DistalFull                                                             +---------+---------------+---------+-----------+----------+-------------------+ PFV      Full                                                             +---------+---------------+---------+-----------+----------+-------------------+ POP                                                    Not visualized      +---------+---------------+---------+-----------+----------+-------------------+ PTV                                                   poor visualization                                                        due to pt                                                                 cooperation and                                                           tolerance.          +---------+---------------+---------+-----------+----------+-------------------+ PERO  poor visualization                                                        due to pt                                                                 cooperation and                                                           tolerance.          +---------+---------------+---------+-----------+----------+-------------------+ GSV      Full                                                             +---------+---------------+---------+-----------+----------+-------------------+   +---------+---------------+---------+-----------+----------+-------------------+ LEFT     CompressibilityPhasicitySpontaneityPropertiesThrombus Aging      +---------+---------------+---------+-----------+----------+-------------------+ CFV      Full           Yes      Yes                                      +---------+---------------+---------+-----------+----------+-------------------+ SFJ      Full                                                             +---------+---------------+---------+-----------+----------+-------------------+ FV Prox  Full                                                             +---------+---------------+---------+-----------+----------+-------------------+ FV Mid   Full                                                              +---------+---------------+---------+-----------+----------+-------------------+ FV Distal                                             Not visualized      +---------+---------------+---------+-----------+----------+-------------------+ PFV  Full                                                             +---------+---------------+---------+-----------+----------+-------------------+ POP                     Yes      Yes                  unable to compress                                                        due to pt                                                                 cooperation and                                                           tolerance.          +---------+---------------+---------+-----------+----------+-------------------+ PTV                                                   not well visualized +---------+---------------+---------+-----------+----------+-------------------+ PERO                                                  not well visualized +---------+---------------+---------+-----------+----------+-------------------+ GSV      Full                                                             +---------+---------------+---------+-----------+----------+-------------------+     Summary: RIGHT: - There is no evidence of deep vein thrombosis in the lower extremity. However, portions of this examination were limited- see technologist comments above.  - A cystic structure is found in the popliteal fossa.  LEFT: - There is no evidence of deep vein thrombosis in the lower extremity. However, portions of this examination were limited- see technologist comments above.  - No cystic structure found in the popliteal fossa.  *See table(s) above for measurements and observations. Electronically signed by Deitra Mayo MD on 02/22/2019 at 4:12:36 PM.    Final     Labs: BMET Recent Labs  Lab 02/18/19 0050  02/19/19 0610 02/20/19 0700 02/21/19 1110 02/22/19  1609 02/23/19 1258 02/24/19 0800  NA 140 135 142 138 137 139 143  K 5.0 4.5 4.7 4.7 4.8 3.9 3.3*  CL 109 103 111 107 102 104 115*  CO2 18* 22 19* 20* 25 25 21*  GLUCOSE 179* 471* 157* 204* 254* 225* 106*  BUN 80* 81* 85* 87* 85* 82* 63*  CREATININE 4.08* 3.88* 3.80* 3.53* 3.19* 3.04* 2.27*  CALCIUM 8.6* 7.6* 8.5* 8.2* 7.9* 8.1* 6.5*   CBC Recent Labs  Lab 02/18/19 0050 02/18/19 0050 02/19/19 0610 02/20/19 0700 02/21/19 1110 02/24/19 0800  WBC 6.7   < > 5.8 6.4 6.8 9.0  NEUTROABS 4.5  --  3.7 3.8 4.8  --   HGB 9.1*   < > 8.5* 9.6* 9.9* 8.2*  HCT 28.3*   < > 25.5* 28.1* 28.8* 24.3*  MCV 88.7   < > 85.3 84.9 85.0 86.2  PLT 227   < > 217 262 285 271   < > = values in this interval not displayed.    Medications:    . allopurinol  200 mg Oral Daily  . aspirin  81 mg Oral Daily  . atorvastatin  80 mg Oral q1800  . darbepoetin (ARANESP) injection - NON-DIALYSIS  40 mcg Subcutaneous Q Wed-1800  . heparin injection (subcutaneous)  7,500 Units Subcutaneous Q8H  . hydrALAZINE  25 mg Oral Q8H  . insulin aspart  0-15 Units Subcutaneous TID WC  . insulin aspart  3 Units Subcutaneous Once  . insulin glargine  10 Units Subcutaneous Daily  . levothyroxine  150 mcg Oral QAC breakfast  . sodium chloride flush  10-40 mL Intracatheter Q12H  . sodium chloride flush  3 mL Intravenous Q12H      Otelia Santee, MD 02/24/2019, 11:55 AM

## 2019-02-24 NOTE — Progress Notes (Signed)
Pt assisted to [facility]. Pt tolerated well. Pt urinated/BM w/o difficulty. Pt bed lines changed while pt was out of bed. Pts gown changed. Pt back in bed. Pt denies pain at this time. Pt verbalized no needs, concerns or questions at this time.

## 2019-02-25 ENCOUNTER — Telehealth: Payer: Self-pay | Admitting: Rheumatology

## 2019-02-25 LAB — RENAL FUNCTION PANEL
Albumin: 2.4 g/dL — ABNORMAL LOW (ref 3.5–5.0)
Anion gap: 8 (ref 5–15)
BUN: 64 mg/dL — ABNORMAL HIGH (ref 8–23)
CO2: 24 mmol/L (ref 22–32)
Calcium: 8 mg/dL — ABNORMAL LOW (ref 8.9–10.3)
Chloride: 109 mmol/L (ref 98–111)
Creatinine, Ser: 2.76 mg/dL — ABNORMAL HIGH (ref 0.44–1.00)
GFR calc Af Amer: 18 mL/min — ABNORMAL LOW (ref >60)
GFR calc non Af Amer: 16 mL/min — ABNORMAL LOW (ref >60)
Glucose, Bld: 198 mg/dL — ABNORMAL HIGH (ref 70–99)
Phosphorus: 3.5 mg/dL (ref 2.5–4.6)
Potassium: 4.3 mmol/L (ref 3.5–5.1)
Sodium: 141 mmol/L (ref 135–145)

## 2019-02-25 LAB — GLUCOSE, CAPILLARY
Glucose-Capillary: 100 mg/dL — ABNORMAL HIGH (ref 70–99)
Glucose-Capillary: 173 mg/dL — ABNORMAL HIGH (ref 70–99)

## 2019-02-25 LAB — BASIC METABOLIC PANEL
Anion gap: 9 (ref 5–15)
BUN: 70 mg/dL — ABNORMAL HIGH (ref 8–23)
CO2: 23 mmol/L (ref 22–32)
Calcium: 8.1 mg/dL — ABNORMAL LOW (ref 8.9–10.3)
Chloride: 108 mmol/L (ref 98–111)
Creatinine, Ser: 2.66 mg/dL — ABNORMAL HIGH (ref 0.44–1.00)
GFR calc Af Amer: 19 mL/min — ABNORMAL LOW (ref >60)
GFR calc non Af Amer: 16 mL/min — ABNORMAL LOW (ref >60)
Glucose, Bld: 146 mg/dL — ABNORMAL HIGH (ref 70–99)
Potassium: 4.8 mmol/L (ref 3.5–5.1)
Sodium: 140 mmol/L (ref 135–145)

## 2019-02-25 NOTE — Telephone Encounter (Signed)
Patient tested positive for COVID on 02/14/19. I spoke with husband in reference to patient's upcoming appt on 03/01/19. Patient's spouse asked that I call back at a later date to reschedule with patient. Appt for 2/23 has been canceled.

## 2019-02-25 NOTE — TOC Transition Note (Signed)
Transition of Care [facility]) - CM/SW Discharge Note   Patient Details  Name: [name] MRN: [id number] Date of Birth: 1940-07-11  [facility]) CM/SW Contact:  Geralynn Ochs, LCSW Phone Number: 02/25/2019, 11:11 AM   Clinical Narrative:   Per MD, patient to be discharging home today. CSW ordered walker through [employer] for delivery to patient's room, and notified Tommi Rumps with [facility] that patient is discharging home today. No further needs at this time, CSW signing off.    Final next level of care: [place] Barriers to Discharge: Barriers Resolved   Patient Goals and CMS Choice Patient states their goals for this hospitalization and ongoing recovery are:: get better CMS Medicare.gov Compare Post Acute Care list provided to:: Patient Choice offered to / list presented to : Patient  Discharge Placement                       Discharge Plan and Services   Discharge Planning Services: CM Consult Post Acute Care Choice: Home Health, Durable Medical Equipment          DME Arranged: Walker rolling DME Agency: [employer] Date DME Agency Contacted: 02/25/19   Representative spoke with at DME Agency: Thedore Mins [facility] Arranged: PT, OT [place] Agency: [place] Date [place]: 02/25/19 Time [place]: [place] Representative spoke with at [place]: [place]) Interventions     Readmission Risk Interventions No flowsheet data found.

## 2019-02-25 NOTE — Plan of Care (Signed)
  Problem: Education: Goal: Knowledge of risk factors and measures for prevention of condition will improve Outcome: Progressing   Problem: Coping: Goal: Psychosocial and spiritual needs will be supported Outcome: Progressing   Problem: Respiratory: Goal: Will maintain a patent airway Outcome: Progressing   

## 2019-02-25 NOTE — Progress Notes (Signed)
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  Nephrology has been consulted & following.  Patient also had an admission in February 2020 with symptomatic bradycardia and near syncope episode, found to have worsening aortic stenosis.  Was also evaluated by EP without recommendations for pacemaker at that time.  Subjective / 24h Interval events: She has no complaints, no shortness of breath, no chest pain.  Assessment & Plan:  Principal Problem Covid-19 Viral Illness -Completed 5 days of remdesivir while hospitalized as well as 10 days of steroids with prednisone.  Last day was 2/18 -D-dimer persistently elevated and underwent lower extremity Dopplers which were negative for DVT on 2/16.  D-dimer now improving -Continue supportive treatment   COVID-19 Labs  Recent Labs    02/22/19 1609 02/23/19 1041 02/23/19 1258 02/24/19 0800  DDIMER 10.06* 9.37*  --  3.65*  FERRITIN 141  --  2,083* 1,193*  CRP 1.0*  --  0.8 <0.5   Active Problems Acute kidney injury on chronic kidney disease stage IV -Initial suspicions were for ATN due to diarrheal illness -Renal ultrasound showed medical renal disease -Stop NSAIDs, avoid further nephrotoxins -Baseline creatinine 1.4-1.6, was as high as 4 on admission, improving yesterday to 2.2 however today he is getting worse again. -I have discussed her case with Dr. Hollie Salk, unclear at this point as to why her creatinine is going back up,?  AIN, obtain UA, further work-up per  nephrology  Diarrhea -Likely viral due to Covid, was initially on empiric ceftriaxone metronidazole which has been stopped on 2/13.  Improved  Chronic anemia in the setting of chronic kidney disease -She is Jehovah's Witness, her hemoglobin has remained overall stable, minimize further blood work to renal function only  Rheumatoid arthritis -Outpatient management, hold leflunomide, abatacept -Prednisone taper  Upper extremity DVT in 2017 -Unclear if she ever was anticoagulated, currently on aspirin 81 mg  Prior stroke 2018 -Appears recovered  Severe AS -Makes maintaining an euvolemic fluid status even more complicated but stable now  DM 2 -Continue Lantus and sliding scale  CBG (last 3)  Recent Labs    02/24/19 2009 02/25/19 0741 02/25/19 1129  GLUCAP 245* 100* 173*   Hypothyroidism -TSH was quite suppressed on 02/15/2019, decrease Synthroid to 150  Hyperlipidemia -continue statin  Hepatic hypodensity -most recent ultrasound performed of kidneys did not show anything specific on liver -may need outpatient characterization of the same   Scheduled Meds: . allopurinol  200 mg Oral Daily  . aspirin  81 mg Oral Daily  . atorvastatin  80 mg Oral q1800  . darbepoetin (ARANESP) injection - NON-DIALYSIS  40 mcg Subcutaneous Q Wed-1800  . heparin injection (subcutaneous)  5,000 Units Subcutaneous Q8H  . hydrALAZINE  25 mg Oral Q8H  . insulin aspart  0-15 Units Subcutaneous TID WC  . insulin aspart  3 Units Subcutaneous Once  . insulin glargine  10 Units Subcutaneous Daily  . levothyroxine  150 mcg Oral QAC breakfast  . sodium chloride flush  10-40 mL Intracatheter Q12H  . sodium chloride flush  3 mL Intravenous Q12H   Continuous Infusions: PRN Meds:.acetaminophen, cyclobenzaprine, HYDROcodone-acetaminophen, menthol-cetylpyridinium, ondansetron **OR** ondansetron (ZOFRAN) IV, sodium chloride flush, sodium chloride flush  DVT prophylaxis: Heparin Code Status: Full  code Family Communication: Daughter Beyonce Sawatzky [phone] Patient admitted from: Home Anticipated d/c place: Home Barriers to d/c: Renal failure, Home when cleared by nephrology  Consultants:  Nephrology  Procedures:  Doppler DVT 2/16 -no evidence of DVT left or right   Microbiology: None  Antibacterials: None    Objective: Vitals:   02/25/19 0339 02/25/19 0449 02/25/19 0719 02/25/19 1500  BP: (!) 150/49  (!) 169/62 (!) 130/43  Pulse: (!) 57  60 (!) 59  Resp: 18  18 18   Temp: 98.4 F (36.9 C)  98.7 F (37.1 C) 98.3 F (36.8 C)  TempSrc:   Oral Oral  SpO2: 99%  95% 98%  Weight:  80 kg    Height:  5\' 4"  (1.626 m)      Intake/Output Summary (Last 24 hours) at 02/25/2019 1601 Last data filed at 02/25/2019 0949 Gross per 24 hour  Intake 960 ml  Output 1715 ml  Net -755 ml   Filed Weights   02/22/19 0500 02/24/19 0435 02/25/19 0449  Weight: 80 kg 79 kg 80 kg    Examination:  Constitutional: No distress, in bed Eyes: No scleral icterus ENMT: Moist proximal membranes Neck: normal, supple Respiratory: Clear to auscultation bilaterally, no wheezing, normal respiratory effort Cardiovascular: Regular rate and rhythm, no murmurs Abdomen: Soft, nontender, nondistended, positive bowel sounds Musculoskeletal: no clubbing / cyanosis.  Skin: No rashes seen Neurologic: non focal    Data Reviewed: I have independently reviewed following labs and imaging studies   CBC: Recent Labs  Lab 02/19/19 0610 02/20/19 0700 02/21/19 1110 02/24/19 0800  WBC 5.8 6.4 6.8 9.0  NEUTROABS 3.7 3.8 4.8  --   HGB 8.5* 9.6* 9.9* 8.2*  HCT 25.5* 28.1* 28.8* 24.3*  MCV 85.3 84.9 85.0 86.2  PLT 217 262 285 831   Basic Metabolic Panel: Recent Labs  Lab 02/22/19 1609 02/23/19 1258 02/24/19 0800 02/25/19 0053 02/25/19 1345  NA 137 139 143 140 141  K 4.8 3.9 3.3* 4.8 4.3  CL 102 104 115* 108 109  CO2 25 25 21* 23 24  GLUCOSE 254* 225* 106* 146* 198*  BUN 85* 82* 63* 70*  64*  CREATININE 3.19* 3.04* 2.27* 2.66* 2.76*  CALCIUM 7.9* 8.1* 6.5* 8.1* 8.0*  PHOS  --   --   --   --  3.5   GFR: Estimated Creatinine Clearance: 16.6 mL/min (A) (by C-G formula based on SCr of 2.76 mg/dL (H)). Liver Function Tests: Recent Labs  Lab 02/19/19 0610 02/19/19 0610 02/20/19 0700 02/21/19 1110 02/22/19 1609 02/23/19 1258 02/25/19 1345  AST 44*  --  77* 42* 36 30  --   ALT 26  --  46* 38 32 31  --   ALKPHOS 97  --  109 112 110 102  --   BILITOT 0.8  --  0.6 0.6 0.7 0.9  --   PROT 5.2*  --  5.5* 5.4* 5.3* 5.2*  --   ALBUMIN 2.2*   < > 2.5* 2.3* 2.3* 2.4* 2.4*   < > = values in this interval not displayed.   No results for input(s): LIPASE, AMYLASE in the last 168 hours. No results for input(s): AMMONIA in the last 168 hours. Coagulation Profile: No results for input(s): INR, PROTIME in the last 168 hours. Cardiac Enzymes: No results for  input(s): CKTOTAL, CKMB, CKMBINDEX, TROPONINI in the last 168 hours. BNP (last 3 results) No results for input(s): PROBNP in the last 8760 hours. HbA1C: No results for input(s): HGBA1C in the last 72 hours. CBG: Recent Labs  Lab 02/23/19 1957 02/24/19 0756 02/24/19 2009 02/25/19 0741 02/25/19 1129  GLUCAP 300* 126* 245* 100* 173*   Lipid Profile: No results for input(s): CHOL, HDL, LDLCALC, TRIG, CHOLHDL, LDLDIRECT in the last 72 hours. Thyroid Function Tests: No results for input(s): TSH, T4TOTAL, FREET4, T3FREE, THYROIDAB in the last 72 hours. Anemia Panel: Recent Labs    02/23/19 1258 02/24/19 0800  FERRITIN 2,083* 1,193*   Urine analysis:    Component Value Date/Time   COLORURINE AMBER (A) 02/14/2019 1945   APPEARANCEUR CLOUDY (A) 02/14/2019 1945   LABSPEC 1.014 02/14/2019 1945   LABSPEC 1.030 01/10/2010 1424   PHURINE 5.0 02/14/2019 1945   GLUCOSEU NEGATIVE 02/14/2019 1945   HGBUR SMALL (A) 02/14/2019 1945   BILIRUBINUR NEGATIVE 02/14/2019 1945   BILIRUBINUR not done 01/10/2010 1424   [place] 02/14/2019 1945   PROTEINUR >=300 (A) 02/14/2019 1945   UROBILINOGEN 0.2 09/20/2014 1643   NITRITE NEGATIVE 02/14/2019 1945   LEUKOCYTESUR NEGATIVE 02/14/2019 1945   LEUKOCYTESUR Large 01/10/2010 1424   Sepsis Labs: Invalid input(s): PROCALCITONIN, LACTICIDVEN  No results found for this or any previous visit (from the past 240 hour(s)).    Radiology Studies: No results found.  Marzetta Board, MD, PhD [facility]  Between 7 am - 7 pm I am available, please contact me via Amion or Securechat  Between 7 pm - 7 am I am not available, please contact night coverage MD/APP via Amion

## 2019-02-25 NOTE — Progress Notes (Addendum)
  [facility] Progress Note     Assessment/ Plan:   1. AoCKD 3 - baseline creat 1.4- 1.7 from Nov '20 . Here creat 4.2 > 3.5 > 4.0 > 3.8. Suspected due to vol depletion+ CKD+ ARB prior to admit. Renal US no hydro, +echogenic kidneys.Making urine. DC'd nsaid gel and PPI. Urine prot-cr ratio negative.  Creatinine continues to improve overall but was 3.0-->2.2 2.18-->2.6 2/19-->2.7 2/19 PM.  Bladder scans OK, ? AIN but would be atypical Cr trend, will re-send UA and culture; she's off antibiotics which would be most likely culprits anyway.  Previous renal US without hydro/stones/ obstruction.    2. HTN -on hydralazine 25 TID 3. COVID + PNA: s/p IV decadron and remdesivir and  IV abx, on RA 4. Anemia ckd - Hb 8-10 range, sp darbe 40 ug SQ 2/10 then q wed. Pt is Fara Boros witness.  5. DM on insulin  6. H/o aortic stenosis 7. H/o RA  Subjective:    Cr down to 2.2-->2.6 and then 2.7 this afternoon.  Bladder scans not reflective of urinary retention.  Eating and drinking OK.     Objective:   BP (!) 130/43 (BP Location: Right Arm)   Pulse (!) 59   Temp 98.3 F (36.8 C) (Oral)   Resp 18   Ht 5\' 4"  (1.626 m)   Wt 80 kg   LMP  (LMP Unknown)   SpO2 98%   BMI 30.27 kg/m   Intake/Output Summary (Last 24 hours) at 02/25/2019 1736 Last data filed at 02/25/2019 0949 Gross per 24 hour  Intake 960 ml  Output 1265 ml  Net -305 ml   Weight change: 1 kg  Physical Exam: Seen remotely  Imaging: No results found.  Labs: BMET Recent Labs  Lab 02/20/19 0700 02/21/19 1110 02/22/19 1609 02/23/19 1258 02/24/19 0800 02/25/19 0053 02/25/19 1345  NA 142 138 137 139 143 140 141  K 4.7 4.7 4.8 3.9 3.3* 4.8 4.3  CL 111 107 102 104 115* 108 109  CO2 19* 20* 25 25 21* 23 24  GLUCOSE 157* 204* 254* 225* 106* 146* 198*  BUN 85* 87* 85* 82* 63* 70* 64*  CREATININE 3.80* 3.53* 3.19* 3.04* 2.27* 2.66* 2.76*  CALCIUM 8.5* 8.2* 7.9* 8.1* 6.5* 8.1* 8.0*  PHOS  --   --   --   --    --   --  3.5   CBC Recent Labs  Lab 02/19/19 0610 02/20/19 0700 02/21/19 1110 02/24/19 0800  WBC 5.8 6.4 6.8 9.0  NEUTROABS 3.7 3.8 4.8  --   HGB 8.5* 9.6* 9.9* 8.2*  HCT 25.5* 28.1* 28.8* 24.3*  MCV 85.3 84.9 85.0 86.2  PLT 217 262 285 271    Medications:    . allopurinol  200 mg Oral Daily  . aspirin  81 mg Oral Daily  . atorvastatin  80 mg Oral q1800  . darbepoetin (ARANESP) injection - NON-DIALYSIS  40 mcg Subcutaneous Q Wed-1800  . heparin injection (subcutaneous)  5,000 Units Subcutaneous Q8H  . hydrALAZINE  25 mg Oral Q8H  . insulin aspart  0-15 Units Subcutaneous TID WC  . insulin aspart  3 Units Subcutaneous Once  . insulin glargine  10 Units Subcutaneous Daily  . levothyroxine  150 mcg Oral QAC breakfast  . sodium chloride flush  10-40 mL Intracatheter Q12H  . sodium chloride flush  3 mL Intravenous Q12H      Madelon Lips, MD 02/25/2019, 5:36 PM

## 2019-02-26 LAB — RENAL FUNCTION PANEL
Albumin: 2.5 g/dL — ABNORMAL LOW (ref 3.5–5.0)
Anion gap: 10 (ref 5–15)
BUN: 53 mg/dL — ABNORMAL HIGH (ref 8–23)
CO2: 19 mmol/L — ABNORMAL LOW (ref 22–32)
Calcium: 8.4 mg/dL — ABNORMAL LOW (ref 8.9–10.3)
Chloride: 110 mmol/L (ref 98–111)
Creatinine, Ser: 2.56 mg/dL — ABNORMAL HIGH (ref 0.44–1.00)
GFR calc Af Amer: 20 mL/min — ABNORMAL LOW (ref >60)
GFR calc non Af Amer: 17 mL/min — ABNORMAL LOW (ref >60)
Glucose, Bld: 149 mg/dL — ABNORMAL HIGH (ref 70–99)
Phosphorus: 3.2 mg/dL (ref 2.5–4.6)
Potassium: 4.6 mmol/L (ref 3.5–5.1)
Sodium: 139 mmol/L (ref 135–145)

## 2019-02-26 LAB — URINALYSIS, ROUTINE W REFLEX MICROSCOPIC
Bacteria, UA: NONE SEEN
Bilirubin Urine: NEGATIVE
Glucose, UA: NEGATIVE mg/dL
Hgb urine dipstick: NEGATIVE
Ketones, ur: NEGATIVE mg/dL
Nitrite: NEGATIVE
Protein, ur: 100 mg/dL — AB
Specific Gravity, Urine: 1.011 (ref 1.005–1.030)
pH: 7 (ref 5.0–8.0)

## 2019-02-26 LAB — PROTEIN / CREATININE RATIO, URINE
Creatinine, Urine: 63.88 mg/dL
Protein Creatinine Ratio: 3.34 mg/mg{Cre} — ABNORMAL HIGH (ref 0.00–0.15)
Total Protein, Urine: 213.21 mg/dL

## 2019-02-26 NOTE — Progress Notes (Signed)
PROGRESS NOTE  [name] JQG:[id number] DOB: 30-May-1940 DOA: 02/14/2019 PCP: Jonathon Jordan, MD   LOS: 11 days   Brief Narrative / Interim history: 79 year old female who is a Jehovah's Witness, chronic kidney disease stage IV with baseline creatinine 1.4-1.6, hypothyroidism, RA, hypertension, prior DVT in 2017, CVA 2018, aortic stenosis, DM 2, who was admitted to the hospital with abdominal pain as well as diarrhea, found to have be Covid positive and have acute kidney injury with a creatinine of 4.  Nephrology has been consulted & following.  Patient also had an admission in February 2020 with symptomatic bradycardia and near syncope episode, found to have worsening aortic stenosis.  Was also evaluated by EP without recommendations for pacemaker at that time.  Subjective / 24h Interval events: No shortness of breath, no chest pain, no nausea or vomiting.  Feels well.  Appreciates that her swelling has improved  Assessment & Plan:  Principal Problem Covid-19 Viral Illness -Completed 5 days of remdesivir while hospitalized as well as 10 days of steroids with prednisone.  Last day was 2/18 -D-dimer persistently elevated and underwent lower extremity Dopplers which were negative for DVT on 2/16.  D-dimer now improving -Continue supportive treatment   COVID-19 Labs  Recent Labs    02/24/19 0800  DDIMER 3.65*  FERRITIN 1,193*  CRP <0.5   Active Problems Acute kidney injury on chronic kidney disease stage IV -Initial suspicions were for ATN due to diarrheal illness -Renal ultrasound showed medical renal disease -Stop NSAIDs, avoid further nephrotoxins -Baseline creatinine 1.4-1.6, was as high as 4 on admission, improving on 2/18 but getting worse on 2/19 and nephrology has been reconsulted.  Further studies have been ordered with UA, protein creatinine ratio, cultures but they have not been collected overnight for unclear reasons -Creatinine this morning slightly better, if  it improved tomorrow and we have urinalysis, she will be able to go home  Diarrhea -Likely viral due to Covid, was initially on empiric ceftriaxone metronidazole which has been stopped on 2/13.  Improved  Chronic anemia in the setting of chronic kidney disease -She is Jehovah's Witness, her hemoglobin has remained overall stable, minimize further blood work to renal function only  Rheumatoid arthritis -Outpatient management, hold leflunomide, abatacept -Prednisone taper  Upper extremity DVT in 2017 -Unclear if she ever was anticoagulated, currently on aspirin 81 mg  Prior stroke 2018 -Appears recovered  Severe AS -Makes maintaining an euvolemic fluid status even more complicated but stable now  DM 2 -Continue Lantus and sliding scale  CBG (last 3)  Recent Labs    02/24/19 2009 02/25/19 0741 02/25/19 1129  GLUCAP 245* 100* 173*   Hypothyroidism -TSH was quite suppressed on 02/15/2019, decrease Synthroid to 150  Hyperlipidemia -continue statin  Hepatic hypodensity -most recent ultrasound performed of kidneys did not show anything specific on liver -may need outpatient characterization of the same   Scheduled Meds: . allopurinol  200 mg Oral Daily  . aspirin  81 mg Oral Daily  . atorvastatin  80 mg Oral q1800  . darbepoetin (ARANESP) injection - NON-DIALYSIS  40 mcg Subcutaneous Q Wed-1800  . heparin injection (subcutaneous)  5,000 Units Subcutaneous Q8H  . hydrALAZINE  25 mg Oral Q8H  . insulin aspart  0-15 Units Subcutaneous TID WC  . insulin aspart  3 Units Subcutaneous Once  . insulin glargine  10 Units Subcutaneous Daily  . levothyroxine  150 mcg Oral QAC breakfast  . sodium chloride flush  10-40 mL Intracatheter Q12H  .  sodium chloride flush  3 mL Intravenous Q12H   Continuous Infusions: PRN Meds:.acetaminophen, cyclobenzaprine, HYDROcodone-acetaminophen, menthol-cetylpyridinium, ondansetron **OR** ondansetron (ZOFRAN) IV, sodium chloride flush, sodium  chloride flush  DVT prophylaxis: Heparin Code Status: Full code Family Communication: Daughter [name] [phone] updated on 2/19 Patient admitted from: Home Anticipated d/c place: Home Barriers to d/c: Renal failure, Home when cleared by nephrology  Consultants:  Nephrology  Procedures:  Doppler DVT 2/16 -no evidence of DVT left or right   Microbiology: None  Antibacterials: None    Objective: Vitals:   02/25/19 2036 02/26/19 0514 02/26/19 0717 02/26/19 1127  BP: (!) 148/64 97/67 (!) 158/72 103/62  Pulse: 63 65 62 61  Resp: 18 18 18 18   Temp: 97.9 F (36.6 C) 97.8 F (36.6 C) 98.2 F (36.8 C) 98.2 F (36.8 C)  TempSrc: Oral Axillary Axillary Axillary  SpO2: 99% 98% 100% 99%  Weight:      Height:        Intake/Output Summary (Last 24 hours) at 02/26/2019 1315 Last data filed at 02/26/2019 0857 Gross per 24 hour  Intake 800 ml  Output 400 ml  Net 400 ml   Filed Weights   02/22/19 0500 02/24/19 0435 02/25/19 0449  Weight: 80 kg 79 kg 80 kg    Examination:  Constitutional: NAD Eyes: No icterus ENMT: Moist mucous membranes Neck: normal, supple Respiratory: Clear without wheezing Cardiovascular: Regular, no murmurs, trace edema Abdomen: Soft, NT, ND, bowel sounds positive Musculoskeletal: no clubbing / cyanosis.  Skin: No rashes seen Neurologic: non focal    Data Reviewed: I have independently reviewed following labs and imaging studies   CBC: Recent Labs  Lab 02/20/19 0700 02/21/19 1110 02/24/19 0800  WBC 6.4 6.8 9.0  NEUTROABS 3.8 4.8  --   HGB 9.6* 9.9* 8.2*  HCT 28.1* 28.8* 24.3*  MCV 84.9 85.0 86.2  PLT 262 285 412   Basic Metabolic Panel: Recent Labs  Lab 02/23/19 1258 02/24/19 0800 02/25/19 0053 02/25/19 1345 02/26/19 1122  NA 139 143 140 141 139  K 3.9 3.3* 4.8 4.3 4.6  CL 104 115* 108 109 110  CO2 25 21* 23 24 19*  GLUCOSE 225* 106* 146* 198* 149*  BUN 82* 63* 70* 64* 53*  CREATININE 3.04* 2.27* 2.66* 2.76* 2.56*    CALCIUM 8.1* 6.5* 8.1* 8.0* 8.4*  PHOS  --   --   --  3.5 3.2   GFR: Estimated Creatinine Clearance: 17.9 mL/min (A) (by C-G formula based on SCr of 2.56 mg/dL (H)). Liver Function Tests: Recent Labs  Lab 02/20/19 0700 02/20/19 0700 02/21/19 1110 02/22/19 1609 02/23/19 1258 02/25/19 1345 02/26/19 1122  AST 77*  --  42* 36 30  --   --   ALT 46*  --  38 32 31  --   --   ALKPHOS 109  --  112 110 102  --   --   BILITOT 0.6  --  0.6 0.7 0.9  --   --   PROT 5.5*  --  5.4* 5.3* 5.2*  --   --   ALBUMIN 2.5*   < > 2.3* 2.3* 2.4* 2.4* 2.5*   < > = values in this interval not displayed.   No results for input(s): LIPASE, AMYLASE in the last 168 hours. No results for input(s): AMMONIA in the last 168 hours. Coagulation Profile: No results for input(s): INR, PROTIME in the last 168 hours. Cardiac Enzymes: No results for input(s): CKTOTAL, CKMB, CKMBINDEX, TROPONINI in the  last 168 hours. BNP (last 3 results) No results for input(s): PROBNP in the last 8760 hours. HbA1C: No results for input(s): HGBA1C in the last 72 hours. CBG: Recent Labs  Lab 02/23/19 1957 02/24/19 0756 02/24/19 2009 02/25/19 0741 02/25/19 1129  GLUCAP 300* 126* 245* 100* 173*   Lipid Profile: No results for input(s): CHOL, HDL, LDLCALC, TRIG, CHOLHDL, LDLDIRECT in the last 72 hours. Thyroid Function Tests: No results for input(s): TSH, T4TOTAL, FREET4, T3FREE, THYROIDAB in the last 72 hours. Anemia Panel: Recent Labs    02/24/19 0800  FERRITIN 1,193*   Urine analysis:    Component Value Date/Time   COLORURINE AMBER (A) 02/14/2019 1945   APPEARANCEUR CLOUDY (A) 02/14/2019 1945   LABSPEC 1.014 02/14/2019 1945   LABSPEC 1.030 01/10/2010 1424   PHURINE 5.0 02/14/2019 1945   GLUCOSEU NEGATIVE 02/14/2019 1945   HGBUR SMALL (A) 02/14/2019 1945   BILIRUBINUR NEGATIVE 02/14/2019 1945   BILIRUBINUR not done 01/10/2010 1424   [place] 02/14/2019 1945   PROTEINUR >=300 (A) 02/14/2019 1945    UROBILINOGEN 0.2 09/20/2014 1643   NITRITE NEGATIVE 02/14/2019 1945   LEUKOCYTESUR NEGATIVE 02/14/2019 1945   LEUKOCYTESUR Large 01/10/2010 1424   Sepsis Labs: Invalid input(s): PROCALCITONIN, LACTICIDVEN  No results found for this or any previous visit (from the past 240 hour(s)).    Radiology Studies: No results found.  Marzetta Board, MD, PhD [facility]  Between 7 am - 7 pm I am available, please contact me via Amion or Securechat  Between 7 pm - 7 am I am not available, please contact night coverage MD/APP via Amion

## 2019-02-26 NOTE — Progress Notes (Signed)
[facility] Progress Note     Assessment/ Plan:   1. AoCKD 3 - baseline creat 1.4- 1.7 from Nov '20 . Here creat 4.2 > 3.5 > 4.0 > 3.8. Suspected due to vol depletion+ CKD+ ARB prior to admit. Renal US no hydro, +echogenic kidneys.Making urine. DC'd nsaid gel and PPI. Urine prot-cr ratio negative.  Creatinine continues to improve overall but was 3.0-->2.2 2.18-->2.6 2/19-->2.7 2/19 PM.  Bladder scans OK, ? AIN but would be atypical Cr trend, will re-send UA and culture; she's off antibiotics which would be most likely culprits anyway.  Labs and urine studies pending.  If UA bland and Cr stable/ better, ok from renal perspective to go.  Had some low BP overnight to 90/50, some nocturnal dipping is expected, if BP has this dramatic dip while pt is awake would consider switching hydral for amlodipine.   2. HTN -on hydralazine 25 TID, as above 3. COVID + PNA: s/p IV decadron and remdesivir and  IV abx, on RA 4. Anemia ckd - Hb 8-10 range, sp darbe 40 ug SQ 2/10 then q wed. Pt is Fara Boros witness.  5. DM on insulin  6. H/o aortic stenosis 7. H/o RA  Subjective:    Labs and urine studies pending.     Objective:   BP 103/62 (BP Location: Right Arm)   Pulse 61   Temp 98.2 F (36.8 C) (Axillary)   Resp (!) 158   Ht 5\' 4"  (1.626 m)   Wt 80 kg   LMP  (LMP Unknown)   SpO2 99%   BMI 30.27 kg/m   Intake/Output Summary (Last 24 hours) at 02/26/2019 1141 Last data filed at 02/26/2019 0857 Gross per 24 hour  Intake 800 ml  Output 400 ml  Net 400 ml   Weight change:   Physical Exam: Seen remotely  Imaging: No results found.  Labs: BMET Recent Labs  Lab 02/20/19 0700 02/21/19 1110 02/22/19 1609 02/23/19 1258 02/24/19 0800 02/25/19 0053 02/25/19 1345  NA 142 138 137 139 143 140 141  K 4.7 4.7 4.8 3.9 3.3* 4.8 4.3  CL 111 107 102 104 115* 108 109  CO2 19* 20* 25 25 21* 23 24  GLUCOSE 157* 204* 254* 225* 106* 146* 198*  BUN 85* 87* 85* 82* 63* 70* 64*   CREATININE 3.80* 3.53* 3.19* 3.04* 2.27* 2.66* 2.76*  CALCIUM 8.5* 8.2* 7.9* 8.1* 6.5* 8.1* 8.0*  PHOS  --   --   --   --   --   --  3.5   CBC Recent Labs  Lab 02/20/19 0700 02/21/19 1110 02/24/19 0800  WBC 6.4 6.8 9.0  NEUTROABS 3.8 4.8  --   HGB 9.6* 9.9* 8.2*  HCT 28.1* 28.8* 24.3*  MCV 84.9 85.0 86.2  PLT 262 285 271    Medications:    . allopurinol  200 mg Oral Daily  . aspirin  81 mg Oral Daily  . atorvastatin  80 mg Oral q1800  . darbepoetin (ARANESP) injection - NON-DIALYSIS  40 mcg Subcutaneous Q Wed-1800  . heparin injection (subcutaneous)  5,000 Units Subcutaneous Q8H  . hydrALAZINE  25 mg Oral Q8H  . insulin aspart  0-15 Units Subcutaneous TID WC  . insulin aspart  3 Units Subcutaneous Once  . insulin glargine  10 Units Subcutaneous Daily  . levothyroxine  150 mcg Oral QAC breakfast  . sodium chloride flush  10-40 mL Intracatheter Q12H  . sodium chloride flush  3 mL Intravenous Q12H  Madelon Lips, MD 02/26/2019, 11:41 AM

## 2019-02-27 DIAGNOSIS — U071 COVID-19: Principal | ICD-10-CM

## 2019-02-27 LAB — RENAL FUNCTION PANEL
Albumin: 2.8 g/dL — ABNORMAL LOW (ref 3.5–5.0)
Anion gap: 9 (ref 5–15)
BUN: 45 mg/dL — ABNORMAL HIGH (ref 8–23)
CO2: 21 mmol/L — ABNORMAL LOW (ref 22–32)
Calcium: 8.9 mg/dL (ref 8.9–10.3)
Chloride: 111 mmol/L (ref 98–111)
Creatinine, Ser: 2.63 mg/dL — ABNORMAL HIGH (ref 0.44–1.00)
GFR calc Af Amer: 19 mL/min — ABNORMAL LOW (ref >60)
GFR calc non Af Amer: 17 mL/min — ABNORMAL LOW (ref >60)
Glucose, Bld: 141 mg/dL — ABNORMAL HIGH (ref 70–99)
Phosphorus: 3 mg/dL (ref 2.5–4.6)
Potassium: 4.5 mmol/L (ref 3.5–5.1)
Sodium: 141 mmol/L (ref 135–145)

## 2019-02-27 MED ORDER — LEVOTHYROXINE SODIUM 150 MCG PO TABS: 150.0000 ug | ORAL_TABLET | Freq: Every day | ORAL | 0 refills | Status: AC

## 2019-02-27 MED ORDER — FLUCONAZOLE 100 MG PO TABS
100.0000 mg | ORAL_TABLET | Freq: Once | ORAL | Status: AC
  Administered 2019-02-27: 100 mg via ORAL
  Filled 2019-02-27: qty 1

## 2019-02-27 MED ORDER — DEXTROSE 50 % IV SOLN
INTRAVENOUS | Status: AC
  Administered 2019-02-27: 04:00:00 12.5 g
  Filled 2019-02-27: qty 50

## 2019-02-27 NOTE — Progress Notes (Signed)
  [facility] Progress Note     Assessment/ Plan:   1. AoCKD 3 - baseline creat 1.4- 1.7 from Nov '20 . Here creat 4.2 > 3.5 > 4.0 > 3.8. Suspected due to vol depletion+ CKD+ ARB prior to admit. Renal US no hydro, +echogenic kidneys.Making urine. DC'd nsaid gel and PPI. Urine prot-cr ratio negative.  Creatinine continues to improve overall but was 3.0-->2.2 2.18-->2.6 2/19-->2.7 2/19 PM.  Bladder scans OK, ? AIN but would be atypical Cr trend, will re-send UA and culture; she's off antibiotics which would be most likely culprits anyway. BP overall OK.  Cr improved, UA ok, proteinuria expected amount.  OK to d/c for renal perspective.    2. HTN -on hydralazine 25 TID, as above 3. COVID + PNA: s/p IV decadron and remdesivir and  IV abx, on RA 4. Anemia ckd - Hb 8-10 range, sp darbe 40 ug SQ 2/10 then q wed. Pt is Fara Boros witness.  5. DM on insulin  6. H/o aortic stenosis 7. H/o RA  Subjective:    Cr improved, UA bland. 3 g proteinuria, expected in AKI.    Objective:   BP (!) 142/58 (BP Location: Right Arm)   Pulse 79   Temp 99.2 F (37.3 C) (Oral)   Resp 18   Ht 5\' 4"  (1.626 m)   Wt 88 kg   LMP  (LMP Unknown)   SpO2 100%   BMI 33.30 kg/m   Intake/Output Summary (Last 24 hours) at 02/27/2019 1010 Last data filed at 02/27/2019 0600 Gross per 24 hour  Intake 1120 ml  Output --  Net 1120 ml   Weight change:   Physical Exam: Seen remotely  Imaging: No results found.  Labs: BMET Recent Labs  Lab 02/21/19 1110 02/22/19 1609 02/23/19 1258 02/24/19 0800 02/25/19 0053 02/25/19 1345 02/26/19 1122  NA 138 137 139 143 140 141 139  K 4.7 4.8 3.9 3.3* 4.8 4.3 4.6  CL 107 102 104 115* 108 109 110  CO2 20* 25 25 21* 23 24 19*  GLUCOSE 204* 254* 225* 106* 146* 198* 149*  BUN 87* 85* 82* 63* 70* 64* 53*  CREATININE 3.53* 3.19* 3.04* 2.27* 2.66* 2.76* 2.56*  CALCIUM 8.2* 7.9* 8.1* 6.5* 8.1* 8.0* 8.4*  PHOS  --   --   --   --   --  3.5 3.2   CBC Recent  Labs  Lab 02/21/19 1110 02/24/19 0800  WBC 6.8 9.0  NEUTROABS 4.8  --   HGB 9.9* 8.2*  HCT 28.8* 24.3*  MCV 85.0 86.2  PLT 285 271    Medications:    . allopurinol  200 mg Oral Daily  . aspirin  81 mg Oral Daily  . atorvastatin  80 mg Oral q1800  . darbepoetin (ARANESP) injection - NON-DIALYSIS  40 mcg Subcutaneous Q Wed-1800  . heparin injection (subcutaneous)  5,000 Units Subcutaneous Q8H  . hydrALAZINE  25 mg Oral Q8H  . insulin aspart  0-15 Units Subcutaneous TID WC  . insulin aspart  3 Units Subcutaneous Once  . insulin glargine  10 Units Subcutaneous Daily  . levothyroxine  150 mcg Oral QAC breakfast  . sodium chloride flush  10-40 mL Intracatheter Q12H  . sodium chloride flush  3 mL Intravenous Q12H      Madelon Lips, MD 02/27/2019, 10:10 AM

## 2019-02-27 NOTE — Progress Notes (Signed)
Discharged home in care of self and daughter. Patient is alert and oriented x 3, no distress seen. Denies pain or injury. AVS given and explained to patient and ndaughter [name]).Both state understanding and will comply. Taken to front lobby via wheelchair per hospital policy.

## 2019-02-27 NOTE — Progress Notes (Signed)
Occupational Therapy Treatment Patient Details Name: [name] MRN: [id number] DOB: Mar 21, 1940 Today's Date: 02/27/2019    History of present illness [name] is a 79 y.o. BF Jehovah's Witness PMHx hypothyroidism, CKD stage III, rheumatoid arthritis, DM type II controlled , chronic anemia, chronic pain, and aortic stenosis, removal intracranial meneigioma. Presenting to the emergency department 02/14/19  for evaluation of lethargy, nausea, vomiting, diarrhea, left lower quadrant abdominal pain, and loss of appetite.  Family called EMS due to patient being essentially bedbound and increasingly lethargic for the past few days.  Household contacts have had COVID-19 recently. Patient positive for Covid virus.   OT comments  Pt demonstrated improved willingness to participate in therapy tasks this date compared to previous sessions. Pt tolerated sitting edge of bed ~10+ min with supervision, noting 0 instances of loss of balance. Pt engaged in seated total body dressing task with supervision. Continued education/instruction with pt on safety strategies, fall prevention, and energy conservation with poor to fair understanding. Pt continues to require increased cues and time to participate in therapy tasks. Pt on room air with SpO2 maintaining in 90s throughout. RN updated. OT will continue to follow acutely.    Follow Up Recommendations  Home health OT;Supervision/Assistance - 24 hour    Equipment Recommendations  None recommended by OT    Recommendations for Other Services      Precautions / Restrictions Precautions Precautions: Fall;Other (comment) Precaution Comments: Pt required min to mod encouragement to participate in therapy.  Restrictions Weight Bearing Restrictions: No       Mobility Bed Mobility Overal bed mobility: Needs Assistance Bed Mobility: Supine to Sit     Supine to sit: Supervision;HOB elevated     General bed mobility comments: Use of  bedrail  Transfers Overall transfer level: Needs assistance Equipment used: Rolling walker (2 wheeled) Transfers: Sit to/from [facility] Sit to Stand: Supervision Stand pivot transfers: Supervision       General transfer comment: Assist to ensure balance and safety    Balance Overall balance assessment: Mild deficits observed, not formally tested                                         ADL either performed or assessed with clinical judgement   ADL Overall ADL's : Needs assistance/impaired                 Upper Body Dressing : Set up;Supervision/safety;Sitting   Lower Body Dressing: Set up;Supervision/safety;Sit to/from stand               Functional mobility during ADLs: Supervision/safety;Rolling walker General ADL Comments: Pt transferred to bedside chair. Pt declined further mobility this date.     Vision       Perception     Praxis      Cognition Arousal/Alertness: Awake/alert Behavior During Therapy: Flat affect Overall Cognitive Status: No family/caregiver present to determine baseline cognitive functioning                                 General Comments: Pt more willing to participate in therapy tasks this date.        Exercises     Shoulder Instructions       General Comments Pt on room air with SpO2 maintaining in 90s throughout.  Pertinent Vitals/ Pain       Pain Assessment: No/denies pain  Home Living                                          Prior Functioning/Environment              Frequency           Progress Toward Goals  OT Goals(current goals can now be found in the care plan section)  Progress towards OT goals: Progressing toward goals  ADL Goals Pt Will Perform Grooming: with modified independence;standing Pt Will Perform Lower Body Bathing: with modified independence;sit to/from stand Pt Will Perform Lower Body Dressing: with  modified independence;sit to/from stand Pt Will Transfer to Toilet: with modified independence;ambulating;regular height toilet Pt Will Perform Toileting - Clothing Manipulation and hygiene: with modified independence;sit to/from stand Additional ADL Goal #1: Pt to tolerate standing up to 10 min with modified independence, in preparation for ADLs. Additional ADL Goal #2: Pt to recall and verbalize 3 fall prevention strategies with min verbal cues.  Plan Discharge plan remains appropriate    Co-evaluation                 AM-PAC OT "6 Clicks" Daily Activity     Outcome Measure   Help from another person eating meals?: None Help from another person taking care of personal grooming?: A Little Help from another person toileting, which includes using toliet, bedpan, or urinal?: A Little Help from another person bathing (including washing, rinsing, drying)?: A Lot Help from another person to put on and taking off regular upper body clothing?: A Little Help from another person to put on and taking off regular lower body clothing?: A Little 6 Click Score: 18    End of Session Equipment Utilized During Treatment: Rolling walker  OT Visit Diagnosis: Unsteadiness on feet (R26.81);Muscle weakness (generalized) (M62.81)   Activity Tolerance Patient tolerated treatment well   Patient Left in chair;with call bell/phone within reach;with chair alarm set   Nurse Communication Mobility status        Time: 0932-3557 OT Time Calculation (min): 24 min  Charges: OT General Charges $OT Visit: 1 Visit OT Treatments $Self Care/Home Management : 23-37 mins  Mauri Brooklyn OTR/L [phone]   Mauri Brooklyn 02/27/2019, 1:33 PM

## 2019-02-27 NOTE — Discharge Instructions (Addendum)
Follow with Jonathon Jordan, MD in 2-3 weeks  You were seen in the hospital by nephrology group from [facility].  They will arrange follow-up for repeat blood work Your Synthroid dose has been decreased to 100 MCG daily.  This was discussed with your endocrinologist Dr. Buddy Duty, please follow-up with him within 3 to 4 weeks Losartan has been discontinued due to renal function   Please get a complete blood count and chemistry panel checked by your Primary MD at your next visit, and again as instructed by your Primary MD. Please get your medications reviewed and adjusted by your Primary MD.  Please request your Primary MD to go over all Hospital Tests and Procedure/Radiological results at the follow up, please get all Hospital records sent to your Prim MD by signing hospital release before you go home.  In some cases, there will be blood work, cultures and biopsy results pending at the time of your discharge. Please request that your primary care M.D. goes through all the records of your hospital data and follows up on these results.  If you had Pneumonia of Lung problems at the Hospital: Please get a 2 view Chest X ray done in 6-8 weeks after hospital discharge or sooner if instructed by your Primary MD.  If you have Congestive Heart Failure: Please call your Cardiologist or Primary MD anytime you have any of the following symptoms:  1) 3 pound weight gain in 24 hours or 5 pounds in 1 week  2) shortness of breath, with or without a dry hacking cough  3) swelling in the hands, feet or stomach  4) if you have to sleep on extra pillows at night in order to breathe  Follow cardiac low salt diet and 1.5 lit/day fluid restriction.  If you have diabetes Accuchecks 4 times/day, Once in AM empty stomach and then before each meal. Log in all results and show them to your primary doctor at your next visit. If any glucose reading is under 80 or above 300 call your primary MD  immediately.  If you have Seizure/Convulsions/Epilepsy: Please do not drive, operate heavy machinery, participate in activities at heights or participate in high speed sports until you have seen by Primary MD or a Neurologist and advised to do so again. Per [facility] statutes, patients with seizures are not allowed to drive until they have been seizure-free for six months.  Use caution when using heavy equipment or power tools. Avoid working on ladders or at heights. Take showers instead of baths. Ensure the water temperature is not too high on the home water heater. Do not go swimming alone. Do not lock yourself in a room alone (i.e. bathroom). When caring for infants or small children, sit down when holding, feeding, or changing them to minimize risk of injury to the child in the event you have a seizure. Maintain good sleep hygiene. Avoid alcohol.   If you had Gastrointestinal Bleeding: Please ask your Primary MD to check a complete blood count within one week of discharge or at your next visit. Your endoscopic/colonoscopic biopsies that are pending at the time of discharge, will also need to followed by your Primary MD.  Get Medicines reviewed and adjusted. Please take all your medications with you for your next visit with your Primary MD  Please request your Primary MD to go over all hospital tests and procedure/radiological results at the follow up, please ask your Primary MD to get all Hospital records sent to his/her  office.  If you experience worsening of your admission symptoms, develop shortness of breath, life threatening emergency, suicidal or homicidal thoughts you must seek medical attention immediately by calling 911 or calling your MD immediately  if symptoms less severe.  You must read complete instructions/literature along with all the possible adverse reactions/side effects for all the Medicines you take and that have been prescribed to you. Take any new Medicines after  you have completely understood and accpet all the possible adverse reactions/side effects.   Do not drive or operate heavy machinery when taking Pain medications.   Do not take more than prescribed Pain, Sleep and Anxiety Medications  Special Instructions: If you have smoked or chewed Tobacco  in the last 2 yrs please stop smoking, stop any regular Alcohol  and or any Recreational drug use.  Wear Seat belts while driving.  Please note You were cared for by a hospitalist during your hospital stay. If you have any questions about your discharge medications or the care you received while you were in the hospital after you are discharged, you can call the unit and asked to speak with the hospitalist on call if the hospitalist that took care of you is not available. Once you are discharged, your primary care physician will handle any further medical issues. Please note that NO REFILLS for any discharge medications will be authorized once you are discharged, as it is imperative that you return to your primary care physician (or establish a relationship with a primary care physician if you do not have one) for your aftercare needs so that they can reassess your need for medications and monitor your lab values.  You can reach the hospitalist office at phone [phone] or fax [phone]   If you do not have a primary care physician, you can call [phone] for a physician referral.  Activity: As tolerated with Full fall precautions use walker/cane & assistance as needed    Diet: regular  Disposition Home

## 2019-02-27 NOTE — Discharge Summary (Signed)
Physician Discharge Summary  MALLEY HAUTER OJJ:[id number] DOB: October 19, 1940 DOA: 02/14/2019  PCP: Jonathon Jordan, MD  Admit date: 02/14/2019 Discharge date: 02/27/2019  Admitted From: Home Disposition: Home  Recommendations for Outpatient Follow-up:  1. Follow up with PCP in 1-2 weeks 2. Follow-up with [facility] for repeat blood work within a week 3. Follow-up with Dr. Buddy Duty with endocrinology in 3 to 4 weeks  Home Health: PT Equipment/Devices: None  Discharge Condition: Stable CODE STATUS: Full code Diet recommendation: Diabetic  HPI: Per admitting MD, [name] is a 79 y.o. female with medical history significant for hypothyroidism, chronic kidney disease stage III, rheumatoid arthritis, insulin-dependent diabetes mellitus, chronic anemia, chronic pain, and aortic stenosis, now presenting to the emergency department for evaluation of lethargy, nausea, vomiting, diarrhea, left lower quadrant abdominal pain, and loss of appetite.  Family called EMS due to patient being essentially bedbound and increasingly lethargic for the past few days.  Household contacts have had COVID-19 recently.  Patient is reportedly alert and fully oriented, and ambulates independently at her baseline, but family has been concerned that she has been too weak to get out of bed and today seemed to be too weak to even speak.  Patient became more alert in the emergency department and was able to provide further history, noting left lower quadrant pain that has been waxing and waning, nausea, nonbloody vomiting, and nonbloody diarrhea.  She has had chills, has had a nonproductive cough, but denies any shortness of breath.  She denies chest pain.  She reports that her legs sometimes swell but not so much recently.  She denies any headache, change in vision or hearing, or focal numbness or weakness. ED Course: Upon arrival to the ED, patient is found to be febrile to 39.4 C, saturating mid to upper 90s on  room air, and with normal heart rate, normal respirations, and stable blood pressure.  EKG features a sinus rhythm with new LBBB.  Chest x-ray is negative for acute cardiopulmonary disease.  CBC is notable for leukocytosis 11,500 and a normocytic anemia with hemoglobin 7.0, down from 10.1 in November 2020.  Covid antigen was positive.  Fecal occult blood testing negative.  Lactic acid reassuringly normal at 1.0.  Troponin and CMP were ordered from the emergency department but not yet resulted.  Blood cultures were collected and the patient was treated with 2 L of IV fluids, Rocephin, Flagyl, and vancomycin.  Hospital Course / Discharge diagnoses: Covid-19 Viral Illness -Completed 5 days of remdesivir while hospitalized as well as 10 days of steroids with prednisone.  Last day was 2/18. D-dimer persistently elevated and underwent lower extremity Dopplers which were negative for DVT on 2/16.  D-dimer now improving.  She recovered well, has remained on room air.  Active Problems Acute kidney injury on chronic kidney disease stage IV -Initial suspicions were for ATN due to diarrheal illness, renal ultrasound showed medical renal disease.  Advised to avoid NSAIDs.  Her baseline creatinine 1.4-1.6, was 4 on admission, improved and stabilized around 2.6 on discharge.  Nephrology was consulted and followed patient while hospitalized. Diarrhea -Likely viral due to Covid, was initially on empiric ceftriaxone metronidazole which has been stopped on 2/13.  Improved Chronic anemia in the setting of chronic kidney disease -She is [employer]'s [employer], her hemoglobin has remained overall stable Rheumatoid arthritis -Outpatient management Upper extremity DVT in 2017 -Unclear if she ever was anticoagulated, currently on aspirin 81 mg.  Defer to outpatient Prior stroke 2018 -Appears recovered  Severe AS - DM 2 -continue home regimen on discharge Hypothyroidism -TSH was quite suppressed on 02/15/2019, decrease Synthroid to  150.  Discussed with Dr. Buddy Duty with endocrinology Hyperlipidemia -continue statin Hepatic hypodensity -initially noted on the CT angio chest PE, will need outpatient characterization of the same  Discharge Instructions   Allergies as of 02/27/2019      Reactions   Methotrexate Derivatives Other (See Comments)   Elevated creat. Levels   Actemra [tocilizumab] Rash   Other Other (See Comments)   NO Blood products.  [name]- causes low blood sugar (pt states she was taking medication but cant remember what it was for.)   Penicillins Rash   Has patient had a PCN reaction causing immediate rash, facial/tongue/throat swelling, SOB or lightheadedness with hypotension: Yes Has patient had a PCN reaction causing severe rash involving mucus membranes or skin necrosis: Yes  Has patient had a PCN reaction that required hospitalization No Has patient had a PCN reaction occurring within the last 10 years: No If all of the above answers are "NO", then may proceed with Cephalosporin use.   Chlorhexidine    Remicade [infliximab] Other (See Comments)   Inadequate response   Zocor [simvastatin] Other (See Comments)   Leg cramps      Medication List    STOP taking these medications   losartan 50 MG tablet Commonly known as: COZAAR   Voltaren 1 % Gel Generic drug: diclofenac Sodium     TAKE these medications   abatacept 750 mg in sodium chloride 0.9 % 70 mL Inject 750 mg into the vein every 28 (twenty-eight) days.   acetaminophen 500 MG tablet Commonly known as: TYLENOL Take 1,000 mg by mouth every 6 (six) hours as needed for mild pain.   allopurinol 300 MG tablet Commonly known as: ZYLOPRIM Take 1 tablet (300 mg total) by mouth daily.   aspirin 81 MG EC tablet Take 1 tablet (81 mg total) by mouth daily.   atorvastatin 80 MG tablet Commonly known as: LIPITOR Take 80 mg by mouth daily.   Back & Body Extra Strength 500-32.5 MG Tabs Generic drug: Aspirin-Caffeine Take 1 tablet by  mouth daily as needed (pain).   BD Insulin Syringe Ultrafine 31G X 15/64" 0.3 ML Misc Generic drug: Insulin Syringe-Needle U-100   CALCIUM 600-D PO Take 1 tablet by mouth daily.   clotrimazole-betamethasone cream Commonly known as: LOTRISONE Apply 1 application topically daily as needed (rash/irritation).   cyclobenzaprine 10 MG tablet Commonly known as: FLEXERIL Take 10 mg by mouth 3 (three) times daily as needed for muscle spasms.   ferrous sulfate 75 (15 Fe) MG/ML Soln Commonly known as: FER-IN-SOL Take 45 mg of iron by mouth daily.   HYDROcodone-acetaminophen 5-325 MG tablet Commonly known as: Norco Take 1 tablet by mouth every 6 (six) hours as needed for moderate pain. What changed: how much to take   leflunomide 10 MG tablet Commonly known as: ARAVA Take 1 tablet (10 mg total) by mouth daily.   levothyroxine 150 MCG tablet Commonly known as: SYNTHROID Take 1 tablet (150 mcg total) by mouth daily before breakfast. Start taking on: February 28, 2019 What changed:   medication strength  how much to take  when to take this   NovoLIN 70/30 FlexPen Relion (70-30) 100 UNIT/ML PEN Generic drug: Insulin Isophane & Regular Human Inject 6-8 Units into the skin 3 (three) times daily as needed (CBG >140).   pregabalin 75 MG capsule Commonly known as: LYRICA 1 bid What  changed:   how much to take  how to take this  when to take this  additional instructions   vitamin B-12 50 MCG tablet Commonly known as: CYANOCOBALAMIN Take 50 mcg by mouth daily.   Vitamin D3 10 MCG (400 UNIT) tablet Take 400 Units by mouth daily.            Durable Medical Equipment  (From admission, onward)         Start     Ordered   02/24/19 1309  For home use only DME Walker  Once    Question:  Patient needs a walker to treat with the following condition  Answer:  Rheumatoid arthritis ([place])   02/24/19 1309         Follow-up Information    [facility] Follow  up.   Specialty: [place] Why: A representative from [facility] will contact you to arrange start date and time for your therapy Contact information: [address] [place] [address] [phone]           Consultations:  Nephrology   Procedures/Studies:  US RENAL  Result Date: 02-28-2019 CLINICAL DATA:  Acute kidney injury. EXAM: RENAL / URINARY TRACT ULTRASOUND COMPLETE COMPARISON:  None. FINDINGS: Right Kidney: Renal measurements: 8.7 x 5.3 x 5.2 cm = volume: 123 mL. 1.6 cm cyst is noted in upper pole. Increased echogenicity of renal parenchyma is noted. No mass or hydronephrosis visualized. Left Kidney: Renal measurements: 10.4 x 5.8 x 5.8 cm = volume: 183 mL. 2.1 cm cyst is seen in lower pole. Increased echogenicity of renal parenchyma is noted. No mass or hydronephrosis visualized. Bladder: Appears normal for degree of bladder distention. Other: None. IMPRESSION: Increased echogenicity of renal parenchyma is noted bilaterally consistent with medical renal disease. No hydronephrosis or renal obstruction is noted. Electronically Signed   By: Marijo Conception M.D.   On: 02-28-2019 09:43   DG Chest Port 1 View  Result Date: 02/14/2019 CLINICAL DATA:  Sepsis EXAM: PORTABLE CHEST 1 VIEW COMPARISON:  02/10/2018 FINDINGS: Heart and mediastinal contours are within normal limits. No focal opacities or effusions. No acute bony abnormality. IMPRESSION: No active disease. Electronically Signed   By: Rolm Baptise M.D.   On: 02/14/2019 18:29   VAS [place] LOWER EXTREMITY VENOUS (DVT)  Result Date: 02/22/2019  Lower Venous DVTStudy Indications: Hx of DVT.  Limitations: Body habitus and sunlight in the room, uncooperative patient. Performing Technologist: Antonieta Pert RDMS, RVT  Examination Guidelines: A complete evaluation includes B-mode imaging, spectral Doppler, color Doppler, and power Doppler as needed of all accessible portions of each vessel. Bilateral testing  is considered an integral part of a complete examination. Limited examinations for reoccurring indications may be performed as noted. The reflux portion of the exam is performed with the patient in reverse Trendelenburg.  +---------+---------------+---------+-----------+----------+-------------------+ RIGHT    CompressibilityPhasicitySpontaneityPropertiesThrombus Aging      +---------+---------------+---------+-----------+----------+-------------------+ CFV      Full           Yes      Yes                                      +---------+---------------+---------+-----------+----------+-------------------+ SFJ      Full                                                             +---------+---------------+---------+-----------+----------+-------------------+  FV Prox  Full                                                             +---------+---------------+---------+-----------+----------+-------------------+ FV Mid   Full                                                             +---------+---------------+---------+-----------+----------+-------------------+ FV DistalFull                                                             +---------+---------------+---------+-----------+----------+-------------------+ PFV      Full                                                             +---------+---------------+---------+-----------+----------+-------------------+ POP                                                   Not visualized      +---------+---------------+---------+-----------+----------+-------------------+ PTV                                                   poor visualization                                                        due to pt                                                                 cooperation and                                                           tolerance.           +---------+---------------+---------+-----------+----------+-------------------+ PERO  poor visualization                                                        due to pt                                                                 cooperation and                                                           tolerance.          +---------+---------------+---------+-----------+----------+-------------------+ GSV      Full                                                             +---------+---------------+---------+-----------+----------+-------------------+   +---------+---------------+---------+-----------+----------+-------------------+ LEFT     CompressibilityPhasicitySpontaneityPropertiesThrombus Aging      +---------+---------------+---------+-----------+----------+-------------------+ CFV      Full           Yes      Yes                                      +---------+---------------+---------+-----------+----------+-------------------+ SFJ      Full                                                             +---------+---------------+---------+-----------+----------+-------------------+ FV Prox  Full                                                             +---------+---------------+---------+-----------+----------+-------------------+ FV Mid   Full                                                             +---------+---------------+---------+-----------+----------+-------------------+ FV Distal                                             Not visualized      +---------+---------------+---------+-----------+----------+-------------------+ PFV  Full                                                             +---------+---------------+---------+-----------+----------+-------------------+ POP                     Yes      Yes                  unable to compress                                                         due to pt                                                                 cooperation and                                                           tolerance.          +---------+---------------+---------+-----------+----------+-------------------+ PTV                                                   not well visualized +---------+---------------+---------+-----------+----------+-------------------+ PERO                                                  not well visualized +---------+---------------+---------+-----------+----------+-------------------+ GSV      Full                                                             +---------+---------------+---------+-----------+----------+-------------------+     Summary: RIGHT: - There is no evidence of deep vein thrombosis in the lower extremity. However, portions of this examination were limited- see technologist comments above.  - A cystic structure is found in the popliteal fossa.  LEFT: - There is no evidence of deep vein thrombosis in the lower extremity. However, portions of this examination were limited- see technologist comments above.  - No cystic structure found in the popliteal fossa.  *See table(s) above for measurements and observations. Electronically signed by Deitra Mayo MD on 02/22/2019 at 4:12:36 PM.    Final    VAS [place] LOWER EXTREMITY VENOUS (DVT)  Result Date: 02/17/2019  Lower Venous DVTStudy Indications: Elevated  ddimer.  Limitations: Body habitus, poor ultrasound/tissue interface and pain tolerance. Comparison Study: 02/10/18 previous Performing Technologist: Abram Sander RVS  Examination Guidelines: A complete evaluation includes B-mode imaging, spectral Doppler, color Doppler, and power Doppler as needed of all accessible portions of each vessel. Bilateral testing is considered an integral part of a complete examination. Limited examinations for  reoccurring indications may be performed as noted. The reflux portion of the exam is performed with the patient in reverse Trendelenburg.  +---------+---------------+---------+-----------+----------+--------------+ RIGHT    CompressibilityPhasicitySpontaneityPropertiesThrombus Aging +---------+---------------+---------+-----------+----------+--------------+ CFV      Full           Yes      Yes                                 +---------+---------------+---------+-----------+----------+--------------+ SFJ      Full                                                        +---------+---------------+---------+-----------+----------+--------------+ FV Prox  Full                                                        +---------+---------------+---------+-----------+----------+--------------+ FV Mid   Full                                                        +---------+---------------+---------+-----------+----------+--------------+ FV Distal               Yes      Yes                                 +---------+---------------+---------+-----------+----------+--------------+ PFV      Full                                                        +---------+---------------+---------+-----------+----------+--------------+ POP      Full           Yes      Yes                                 +---------+---------------+---------+-----------+----------+--------------+ PTV      Full                                                        +---------+---------------+---------+-----------+----------+--------------+ PERO  Not visualized +---------+---------------+---------+-----------+----------+--------------+   +---------+---------------+---------+-----------+----------+--------------+ LEFT     CompressibilityPhasicitySpontaneityPropertiesThrombus Aging  +---------+---------------+---------+-----------+----------+--------------+ CFV      Full           Yes      Yes                                 +---------+---------------+---------+-----------+----------+--------------+ SFJ      Full                                                        +---------+---------------+---------+-----------+----------+--------------+ FV Prox  Full                                                        +---------+---------------+---------+-----------+----------+--------------+ FV Mid   Full                                                        +---------+---------------+---------+-----------+----------+--------------+ FV Distal               Yes      Yes                                 +---------+---------------+---------+-----------+----------+--------------+ PFV      Full                                                        +---------+---------------+---------+-----------+----------+--------------+ POP      Full           Yes      Yes                                 +---------+---------------+---------+-----------+----------+--------------+ PTV      Full                                                        +---------+---------------+---------+-----------+----------+--------------+ PERO                                                  Not visualized +---------+---------------+---------+-----------+----------+--------------+     Summary: BILATERAL: - No evidence of deep vein thrombosis seen in the lower extremities, bilaterally.   *See table(s) above for measurements and observations. Electronically signed by Harold Barban MD on 02/17/2019 at 7:28:13 AM.    Final       Subjective: - no  chest pain, shortness of breath, no abdominal pain, nausea or vomiting.   Discharge Exam: BP (!) 142/58 (BP Location: Right Arm)   Pulse 79   Temp 99.2 F (37.3 C) (Oral)   Resp 18   Ht 5\' 4"  (1.626 m)   Wt 88 kg   LMP  (LMP  Unknown)   SpO2 100%   BMI 33.30 kg/m   General: Pt is alert, awake, not in acute distress Cardiovascular: RRR, S1/S2 +, no rubs, no gallops Respiratory: CTA bilaterally, no wheezing, no rhonchi Abdominal: Soft, NT, ND, bowel sounds + Extremities: no edema, no cyanosis    The results of significant diagnostics from this hospitalization (including imaging, microbiology, ancillary and laboratory) are listed below for reference.     Microbiology: No results found for this or any previous visit (from the past 240 hour(s)).   Labs: Basic Metabolic Panel: Recent Labs  Lab 02/24/19 0800 02/25/19 0053 02/25/19 1345 02/26/19 1122 02/27/19 1000  NA 143 140 141 139 141  K 3.3* 4.8 4.3 4.6 4.5  CL 115* 108 109 110 111  CO2 21* 23 24 19* 21*  GLUCOSE 106* 146* 198* 149* 141*  BUN 63* 70* 64* 53* 45*  CREATININE 2.27* 2.66* 2.76* 2.56* 2.63*  CALCIUM 6.5* 8.1* 8.0* 8.4* 8.9  PHOS  --   --  3.5 3.2 3.0   Liver Function Tests: Recent Labs  Lab 02/21/19 1110 02/21/19 1110 02/22/19 1609 02/23/19 1258 02/25/19 1345 02/26/19 1122 02/27/19 1000  AST 42*  --  36 30  --   --   --   ALT 38  --  32 31  --   --   --   ALKPHOS 112  --  110 102  --   --   --   BILITOT 0.6  --  0.7 0.9  --   --   --   PROT 5.4*  --  5.3* 5.2*  --   --   --   ALBUMIN 2.3*   < > 2.3* 2.4* 2.4* 2.5* 2.8*   < > = values in this interval not displayed.   CBC: Recent Labs  Lab 02/21/19 1110 02/24/19 0800  WBC 6.8 9.0  NEUTROABS 4.8  --   HGB 9.9* 8.2*  HCT 28.8* 24.3*  MCV 85.0 86.2  PLT 285 271   CBG: Recent Labs  Lab 02/23/19 1957 02/24/19 0756 02/24/19 2009 02/25/19 0741 02/25/19 1129  GLUCAP 300* 126* 245* 100* 173*   Hgb A1c No results for input(s): HGBA1C in the last 72 hours. Lipid Profile No results for input(s): CHOL, HDL, LDLCALC, TRIG, CHOLHDL, LDLDIRECT in the last 72 hours. Thyroid function studies No results for input(s): TSH, T4TOTAL, T3FREE, THYROIDAB in the last 72  hours.  Invalid input(s): FREET3 Urinalysis    Component Value Date/Time   COLORURINE YELLOW 02/26/2019 [place] 02/26/2019 1726   LABSPEC 1.011 02/26/2019 1726   LABSPEC 1.030 01/10/2010 1424   PHURINE 7.0 02/26/2019 1726   GLUCOSEU NEGATIVE 02/26/2019 1726   HGBUR NEGATIVE 02/26/2019 1726   BILIRUBINUR NEGATIVE 02/26/2019 1726   BILIRUBINUR not done 01/10/2010 1424   KETONESUR NEGATIVE 02/26/2019 1726   PROTEINUR 100 (A) 02/26/2019 1726   UROBILINOGEN 0.2 09/20/2014 1643   NITRITE NEGATIVE 02/26/2019 1726   LEUKOCYTESUR TRACE (A) 02/26/2019 1726   LEUKOCYTESUR Large 01/10/2010 1424    FURTHER DISCHARGE INSTRUCTIONS:   Get Medicines reviewed and adjusted: Please take all your medications with you for your next visit  with your Primary MD   Laboratory/radiological data: Please request your Primary MD to go over all hospital tests and procedure/radiological results at the follow up, please ask your Primary MD to get all Hospital records sent to his/her office.   In some cases, they will be blood work, cultures and biopsy results pending at the time of your discharge. Please request that your primary care M.D. goes through all the records of your hospital data and follows up on these results.   Also Note the following: If you experience worsening of your admission symptoms, develop shortness of breath, life threatening emergency, suicidal or homicidal thoughts you must seek medical attention immediately by calling 911 or calling your MD immediately  if symptoms less severe.   You must read complete instructions/literature along with all the possible adverse reactions/side effects for all the Medicines you take and that have been prescribed to you. Take any new Medicines after you have completely understood and accpet all the possible adverse reactions/side effects.    Do not drive when taking Pain medications or sleeping medications (Benzodaizepines)   Do not  take more than prescribed Pain, Sleep and Anxiety Medications. It is not advisable to combine anxiety,sleep and pain medications without talking with your primary care practitioner   Special Instructions: If you have smoked or chewed Tobacco  in the last 2 yrs please stop smoking, stop any regular Alcohol  and or any Recreational drug use.   Wear Seat belts while driving.   Please note: You were cared for by a hospitalist during your hospital stay. Once you are discharged, your primary care physician will handle any further medical issues. Please note that NO REFILLS for any discharge medications will be authorized once you are discharged, as it is imperative that you return to your primary care physician (or establish a relationship with a primary care physician if you do not have one) for your post hospital discharge needs so that they can reassess your need for medications and monitor your lab values.  Time coordinating discharge: 40 minutes  SIGNED:  Marzetta Board, MD, PhD 02/27/2019, 11:38 AM

## 2019-02-28 DIAGNOSIS — I5032 Chronic diastolic (congestive) heart failure: Secondary | ICD-10-CM | POA: Diagnosis not present

## 2019-02-28 DIAGNOSIS — K5790 Diverticulosis of intestine, part unspecified, without perforation or abscess without bleeding: Secondary | ICD-10-CM | POA: Diagnosis not present

## 2019-02-28 DIAGNOSIS — I459 Conduction disorder, unspecified: Secondary | ICD-10-CM | POA: Diagnosis not present

## 2019-02-28 DIAGNOSIS — E1122 Type 2 diabetes mellitus with diabetic chronic kidney disease: Secondary | ICD-10-CM | POA: Diagnosis not present

## 2019-02-28 DIAGNOSIS — M0579 Rheumatoid arthritis with rheumatoid factor of multiple sites without organ or systems involvement: Secondary | ICD-10-CM | POA: Diagnosis not present

## 2019-02-28 DIAGNOSIS — U071 COVID-19: Secondary | ICD-10-CM | POA: Diagnosis not present

## 2019-02-28 DIAGNOSIS — K648 Other hemorrhoids: Secondary | ICD-10-CM | POA: Diagnosis not present

## 2019-02-28 DIAGNOSIS — I447 Left bundle-branch block, unspecified: Secondary | ICD-10-CM | POA: Diagnosis not present

## 2019-02-28 DIAGNOSIS — G8929 Other chronic pain: Secondary | ICD-10-CM | POA: Diagnosis not present

## 2019-02-28 DIAGNOSIS — K580 Irritable bowel syndrome with diarrhea: Secondary | ICD-10-CM | POA: Diagnosis not present

## 2019-02-28 DIAGNOSIS — D631 Anemia in chronic kidney disease: Secondary | ICD-10-CM | POA: Diagnosis not present

## 2019-02-28 DIAGNOSIS — N184 Chronic kidney disease, stage 4 (severe): Secondary | ICD-10-CM | POA: Diagnosis not present

## 2019-02-28 DIAGNOSIS — M150 Primary generalized (osteo)arthritis: Secondary | ICD-10-CM | POA: Diagnosis not present

## 2019-02-28 DIAGNOSIS — J479 Bronchiectasis, uncomplicated: Secondary | ICD-10-CM | POA: Diagnosis not present

## 2019-02-28 DIAGNOSIS — H409 Unspecified glaucoma: Secondary | ICD-10-CM | POA: Diagnosis not present

## 2019-02-28 DIAGNOSIS — M545 Low back pain: Secondary | ICD-10-CM | POA: Diagnosis not present

## 2019-02-28 DIAGNOSIS — H40029 Open angle with borderline findings, high risk, unspecified eye: Secondary | ICD-10-CM | POA: Diagnosis not present

## 2019-02-28 DIAGNOSIS — I493 Ventricular premature depolarization: Secondary | ICD-10-CM | POA: Diagnosis not present

## 2019-02-28 DIAGNOSIS — M7121 Synovial cyst of popliteal space [Baker], right knee: Secondary | ICD-10-CM | POA: Diagnosis not present

## 2019-02-28 DIAGNOSIS — I350 Nonrheumatic aortic (valve) stenosis: Secondary | ICD-10-CM | POA: Diagnosis not present

## 2019-02-28 DIAGNOSIS — I130 Hypertensive heart and chronic kidney disease with heart failure and stage 1 through stage 4 chronic kidney disease, or unspecified chronic kidney disease: Secondary | ICD-10-CM | POA: Diagnosis not present

## 2019-02-28 DIAGNOSIS — E1142 Type 2 diabetes mellitus with diabetic polyneuropathy: Secondary | ICD-10-CM | POA: Diagnosis not present

## 2019-02-28 DIAGNOSIS — D573 Sickle-cell trait: Secondary | ICD-10-CM | POA: Diagnosis not present

## 2019-02-28 DIAGNOSIS — I951 Orthostatic hypotension: Secondary | ICD-10-CM | POA: Diagnosis not present

## 2019-02-28 DIAGNOSIS — N179 Acute kidney failure, unspecified: Secondary | ICD-10-CM | POA: Diagnosis not present

## 2019-02-28 LAB — URINE CULTURE

## 2019-03-01 ENCOUNTER — Ambulatory Visit: Payer: Medicare Other | Admitting: Physician Assistant

## 2019-03-02 DIAGNOSIS — U071 COVID-19: Secondary | ICD-10-CM | POA: Diagnosis not present

## 2019-03-02 DIAGNOSIS — E1142 Type 2 diabetes mellitus with diabetic polyneuropathy: Secondary | ICD-10-CM | POA: Diagnosis not present

## 2019-03-02 DIAGNOSIS — M150 Primary generalized (osteo)arthritis: Secondary | ICD-10-CM | POA: Diagnosis not present

## 2019-03-02 DIAGNOSIS — G8929 Other chronic pain: Secondary | ICD-10-CM | POA: Diagnosis not present

## 2019-03-02 DIAGNOSIS — M545 Low back pain: Secondary | ICD-10-CM | POA: Diagnosis not present

## 2019-03-02 DIAGNOSIS — M0579 Rheumatoid arthritis with rheumatoid factor of multiple sites without organ or systems involvement: Secondary | ICD-10-CM | POA: Diagnosis not present

## 2019-03-04 DIAGNOSIS — T148XXA Other injury of unspecified body region, initial encounter: Secondary | ICD-10-CM | POA: Diagnosis not present

## 2019-03-04 DIAGNOSIS — D649 Anemia, unspecified: Secondary | ICD-10-CM | POA: Diagnosis not present

## 2019-03-04 DIAGNOSIS — U071 COVID-19: Secondary | ICD-10-CM | POA: Diagnosis not present

## 2019-03-07 DIAGNOSIS — U071 COVID-19: Secondary | ICD-10-CM | POA: Diagnosis not present

## 2019-03-07 DIAGNOSIS — E1142 Type 2 diabetes mellitus with diabetic polyneuropathy: Secondary | ICD-10-CM | POA: Diagnosis not present

## 2019-03-07 DIAGNOSIS — M150 Primary generalized (osteo)arthritis: Secondary | ICD-10-CM | POA: Diagnosis not present

## 2019-03-07 DIAGNOSIS — M0579 Rheumatoid arthritis with rheumatoid factor of multiple sites without organ or systems involvement: Secondary | ICD-10-CM | POA: Diagnosis not present

## 2019-03-07 DIAGNOSIS — M545 Low back pain: Secondary | ICD-10-CM | POA: Diagnosis not present

## 2019-03-07 DIAGNOSIS — G8929 Other chronic pain: Secondary | ICD-10-CM | POA: Diagnosis not present

## 2019-03-09 ENCOUNTER — Inpatient Hospital Stay (HOSPITAL_COMMUNITY)
Admission: EM | Admit: 2019-03-09 | Discharge: 2019-03-22 | DRG: 871 | Disposition: A | Discharge status: Home Health | Payer: Medicare Other | Attending: Internal Medicine | Admitting: Internal Medicine

## 2019-03-09 ENCOUNTER — Emergency Department (HOSPITAL_COMMUNITY): Payer: Medicare Other

## 2019-03-09 ENCOUNTER — Encounter (HOSPITAL_COMMUNITY): Payer: Self-pay

## 2019-03-09 ENCOUNTER — Encounter (HOSPITAL_COMMUNITY): Payer: Medicare Other

## 2019-03-09 ENCOUNTER — Other Ambulatory Visit: Payer: Self-pay

## 2019-03-09 DIAGNOSIS — Y92230 Patient room in hospital as the place of occurrence of the external cause: Secondary | ICD-10-CM | POA: Diagnosis not present

## 2019-03-09 DIAGNOSIS — A419 Sepsis, unspecified organism: Principal | ICD-10-CM | POA: Diagnosis present

## 2019-03-09 DIAGNOSIS — R509 Fever, unspecified: Secondary | ICD-10-CM

## 2019-03-09 DIAGNOSIS — E860 Dehydration: Secondary | ICD-10-CM | POA: Diagnosis present

## 2019-03-09 DIAGNOSIS — G9340 Encephalopathy, unspecified: Secondary | ICD-10-CM

## 2019-03-09 DIAGNOSIS — Z8719 Personal history of other diseases of the digestive system: Secondary | ICD-10-CM

## 2019-03-09 DIAGNOSIS — Z7189 Other specified counseling: Secondary | ICD-10-CM

## 2019-03-09 DIAGNOSIS — M1A9XX0 Chronic gout, unspecified, without tophus (tophi): Secondary | ICD-10-CM | POA: Diagnosis present

## 2019-03-09 DIAGNOSIS — E1142 Type 2 diabetes mellitus with diabetic polyneuropathy: Secondary | ICD-10-CM | POA: Diagnosis present

## 2019-03-09 DIAGNOSIS — K559 Vascular disorder of intestine, unspecified: Secondary | ICD-10-CM | POA: Diagnosis present

## 2019-03-09 DIAGNOSIS — B3741 Candidal cystitis and urethritis: Secondary | ICD-10-CM | POA: Diagnosis present

## 2019-03-09 DIAGNOSIS — Z79899 Other long term (current) drug therapy: Secondary | ICD-10-CM

## 2019-03-09 DIAGNOSIS — E785 Hyperlipidemia, unspecified: Secondary | ICD-10-CM | POA: Diagnosis present

## 2019-03-09 DIAGNOSIS — M1711 Unilateral primary osteoarthritis, right knee: Secondary | ICD-10-CM | POA: Diagnosis present

## 2019-03-09 DIAGNOSIS — I10 Essential (primary) hypertension: Secondary | ICD-10-CM | POA: Diagnosis present

## 2019-03-09 DIAGNOSIS — E119 Type 2 diabetes mellitus without complications: Secondary | ICD-10-CM

## 2019-03-09 DIAGNOSIS — Z8701 Personal history of pneumonia (recurrent): Secondary | ICD-10-CM

## 2019-03-09 DIAGNOSIS — G92 Toxic encephalopathy: Secondary | ICD-10-CM | POA: Diagnosis not present

## 2019-03-09 DIAGNOSIS — E039 Hypothyroidism, unspecified: Secondary | ICD-10-CM | POA: Diagnosis present

## 2019-03-09 DIAGNOSIS — Z9049 Acquired absence of other specified parts of digestive tract: Secondary | ICD-10-CM

## 2019-03-09 DIAGNOSIS — R296 Repeated falls: Secondary | ICD-10-CM | POA: Diagnosis present

## 2019-03-09 DIAGNOSIS — Z888 Allergy status to other drugs, medicaments and biological substances status: Secondary | ICD-10-CM

## 2019-03-09 DIAGNOSIS — Z515 Encounter for palliative care: Secondary | ICD-10-CM

## 2019-03-09 DIAGNOSIS — Z79891 Long term (current) use of opiate analgesic: Secondary | ICD-10-CM

## 2019-03-09 DIAGNOSIS — I447 Left bundle-branch block, unspecified: Secondary | ICD-10-CM | POA: Diagnosis present

## 2019-03-09 DIAGNOSIS — J9811 Atelectasis: Secondary | ICD-10-CM | POA: Diagnosis not present

## 2019-03-09 DIAGNOSIS — Z86718 Personal history of other venous thrombosis and embolism: Secondary | ICD-10-CM

## 2019-03-09 DIAGNOSIS — Z90722 Acquired absence of ovaries, bilateral: Secondary | ICD-10-CM

## 2019-03-09 DIAGNOSIS — R410 Disorientation, unspecified: Secondary | ICD-10-CM | POA: Diagnosis not present

## 2019-03-09 DIAGNOSIS — Z794 Long term (current) use of insulin: Secondary | ICD-10-CM

## 2019-03-09 DIAGNOSIS — E1122 Type 2 diabetes mellitus with diabetic chronic kidney disease: Secondary | ICD-10-CM | POA: Diagnosis present

## 2019-03-09 DIAGNOSIS — Z86011 Personal history of benign neoplasm of the brain: Secondary | ICD-10-CM

## 2019-03-09 DIAGNOSIS — Z20822 Contact with and (suspected) exposure to covid-19: Secondary | ICD-10-CM | POA: Diagnosis not present

## 2019-03-09 DIAGNOSIS — Z66 Do not resuscitate: Secondary | ICD-10-CM | POA: Diagnosis present

## 2019-03-09 DIAGNOSIS — E440 Moderate protein-calorie malnutrition: Secondary | ICD-10-CM | POA: Diagnosis not present

## 2019-03-09 DIAGNOSIS — S301XXA Contusion of abdominal wall, initial encounter: Secondary | ICD-10-CM | POA: Diagnosis not present

## 2019-03-09 DIAGNOSIS — R4182 Altered mental status, unspecified: Secondary | ICD-10-CM | POA: Diagnosis not present

## 2019-03-09 DIAGNOSIS — G8929 Other chronic pain: Secondary | ICD-10-CM | POA: Diagnosis present

## 2019-03-09 DIAGNOSIS — R402411 Glasgow coma scale score 13-15, in the field [EMT or ambulance]: Secondary | ICD-10-CM | POA: Diagnosis not present

## 2019-03-09 DIAGNOSIS — I5032 Chronic diastolic (congestive) heart failure: Secondary | ICD-10-CM | POA: Diagnosis present

## 2019-03-09 DIAGNOSIS — N183 Chronic kidney disease, stage 3 unspecified: Secondary | ICD-10-CM | POA: Diagnosis present

## 2019-03-09 DIAGNOSIS — R404 Transient alteration of awareness: Secondary | ICD-10-CM | POA: Diagnosis not present

## 2019-03-09 DIAGNOSIS — Z96652 Presence of left artificial knee joint: Secondary | ICD-10-CM | POA: Diagnosis present

## 2019-03-09 DIAGNOSIS — Z8616 Personal history of COVID-19: Secondary | ICD-10-CM | POA: Diagnosis not present

## 2019-03-09 DIAGNOSIS — I350 Nonrheumatic aortic (valve) stenosis: Secondary | ICD-10-CM | POA: Diagnosis present

## 2019-03-09 DIAGNOSIS — Z7982 Long term (current) use of aspirin: Secondary | ICD-10-CM

## 2019-03-09 DIAGNOSIS — N3289 Other specified disorders of bladder: Secondary | ICD-10-CM | POA: Diagnosis present

## 2019-03-09 DIAGNOSIS — Z531 Procedure and treatment not carried out because of patient's decision for reasons of belief and group pressure: Secondary | ICD-10-CM

## 2019-03-09 DIAGNOSIS — D631 Anemia in chronic kidney disease: Secondary | ICD-10-CM | POA: Diagnosis present

## 2019-03-09 DIAGNOSIS — Z823 Family history of stroke: Secondary | ICD-10-CM

## 2019-03-09 DIAGNOSIS — Z9842 Cataract extraction status, left eye: Secondary | ICD-10-CM

## 2019-03-09 DIAGNOSIS — E876 Hypokalemia: Secondary | ICD-10-CM | POA: Diagnosis not present

## 2019-03-09 DIAGNOSIS — Z832 Family history of diseases of the blood and blood-forming organs and certain disorders involving the immune mechanism: Secondary | ICD-10-CM

## 2019-03-09 DIAGNOSIS — Z9841 Cataract extraction status, right eye: Secondary | ICD-10-CM

## 2019-03-09 DIAGNOSIS — Z880 Allergy status to penicillin: Secondary | ICD-10-CM

## 2019-03-09 DIAGNOSIS — T45515A Adverse effect of anticoagulants, initial encounter: Secondary | ICD-10-CM | POA: Diagnosis not present

## 2019-03-09 DIAGNOSIS — Z841 Family history of disorders of kidney and ureter: Secondary | ICD-10-CM

## 2019-03-09 DIAGNOSIS — N136 Pyonephrosis: Secondary | ICD-10-CM | POA: Diagnosis present

## 2019-03-09 DIAGNOSIS — Z9079 Acquired absence of other genital organ(s): Secondary | ICD-10-CM

## 2019-03-09 DIAGNOSIS — E875 Hyperkalemia: Secondary | ICD-10-CM | POA: Diagnosis not present

## 2019-03-09 DIAGNOSIS — D638 Anemia in other chronic diseases classified elsewhere: Secondary | ICD-10-CM | POA: Diagnosis present

## 2019-03-09 DIAGNOSIS — I700 Atherosclerosis of aorta: Secondary | ICD-10-CM | POA: Diagnosis present

## 2019-03-09 DIAGNOSIS — Z9089 Acquired absence of other organs: Secondary | ICD-10-CM

## 2019-03-09 DIAGNOSIS — N184 Chronic kidney disease, stage 4 (severe): Secondary | ICD-10-CM | POA: Diagnosis present

## 2019-03-09 DIAGNOSIS — Z90710 Acquired absence of both cervix and uterus: Secondary | ICD-10-CM

## 2019-03-09 DIAGNOSIS — Z9181 History of falling: Secondary | ICD-10-CM

## 2019-03-09 DIAGNOSIS — D649 Anemia, unspecified: Secondary | ICD-10-CM

## 2019-03-09 DIAGNOSIS — Z6830 Body mass index (BMI) 30.0-30.9, adult: Secondary | ICD-10-CM

## 2019-03-09 DIAGNOSIS — I2510 Atherosclerotic heart disease of native coronary artery without angina pectoris: Secondary | ICD-10-CM | POA: Diagnosis present

## 2019-03-09 DIAGNOSIS — D573 Sickle-cell trait: Secondary | ICD-10-CM | POA: Diagnosis present

## 2019-03-09 DIAGNOSIS — E872 Acidosis: Secondary | ICD-10-CM | POA: Diagnosis not present

## 2019-03-09 DIAGNOSIS — Z8673 Personal history of transient ischemic attack (TIA), and cerebral infarction without residual deficits: Secondary | ICD-10-CM

## 2019-03-09 DIAGNOSIS — Z8249 Family history of ischemic heart disease and other diseases of the circulatory system: Secondary | ICD-10-CM

## 2019-03-09 DIAGNOSIS — N179 Acute kidney failure, unspecified: Secondary | ICD-10-CM | POA: Diagnosis present

## 2019-03-09 DIAGNOSIS — I130 Hypertensive heart and chronic kidney disease with heart failure and stage 1 through stage 4 chronic kidney disease, or unspecified chronic kidney disease: Secondary | ICD-10-CM | POA: Diagnosis present

## 2019-03-09 DIAGNOSIS — E890 Postprocedural hypothyroidism: Secondary | ICD-10-CM | POA: Diagnosis present

## 2019-03-09 DIAGNOSIS — M0579 Rheumatoid arthritis with rheumatoid factor of multiple sites without organ or systems involvement: Secondary | ICD-10-CM | POA: Diagnosis present

## 2019-03-09 DIAGNOSIS — Z833 Family history of diabetes mellitus: Secondary | ICD-10-CM

## 2019-03-09 DIAGNOSIS — Z8611 Personal history of tuberculosis: Secondary | ICD-10-CM

## 2019-03-09 DIAGNOSIS — G40909 Epilepsy, unspecified, not intractable, without status epilepticus: Secondary | ICD-10-CM | POA: Diagnosis present

## 2019-03-09 DIAGNOSIS — Z79890 Hormone replacement therapy (postmenopausal): Secondary | ICD-10-CM

## 2019-03-09 DIAGNOSIS — A09 Infectious gastroenteritis and colitis, unspecified: Secondary | ICD-10-CM | POA: Diagnosis present

## 2019-03-09 DIAGNOSIS — Z87891 Personal history of nicotine dependence: Secondary | ICD-10-CM

## 2019-03-09 DIAGNOSIS — H409 Unspecified glaucoma: Secondary | ICD-10-CM | POA: Diagnosis present

## 2019-03-09 LAB — CBC WITH DIFFERENTIAL/PLATELET
Abs Immature Granulocytes: 0.05 10*3/uL (ref 0.00–0.07)
Basophils Absolute: 0 10*3/uL (ref 0.0–0.1)
Basophils Relative: 0 %
Eosinophils Absolute: 0.4 10*3/uL (ref 0.0–0.5)
Eosinophils Relative: 5 %
HCT: 25.2 % — ABNORMAL LOW (ref 36.0–46.0)
Hemoglobin: 7.8 g/dL — ABNORMAL LOW (ref 12.0–15.0)
Immature Granulocytes: 1 %
Lymphocytes Relative: 21 %
Lymphs Abs: 1.7 10*3/uL (ref 0.7–4.0)
MCH: 29.2 pg (ref 26.0–34.0)
MCHC: 31 g/dL (ref 30.0–36.0)
MCV: 94.4 fL (ref 80.0–100.0)
Monocytes Absolute: 0.5 10*3/uL (ref 0.1–1.0)
Monocytes Relative: 6 %
Neutro Abs: 5.5 10*3/uL (ref 1.7–7.7)
Neutrophils Relative %: 67 %
Platelets: 210 10*3/uL (ref 150–400)
RBC: 2.67 MIL/uL — ABNORMAL LOW (ref 3.87–5.11)
RDW: 18.3 % — ABNORMAL HIGH (ref 11.5–15.5)
WBC: 8.2 10*3/uL (ref 4.0–10.5)
nRBC: 0 % (ref 0.0–0.2)

## 2019-03-09 LAB — TROPONIN I (HIGH SENSITIVITY)
Troponin I (High Sensitivity): 35 ng/L — ABNORMAL HIGH (ref <18)
Troponin I (High Sensitivity): 60 ng/L — ABNORMAL HIGH (ref <18)

## 2019-03-09 LAB — BASIC METABOLIC PANEL
Anion gap: 11 (ref 5–15)
BUN: 19 mg/dL (ref 8–23)
CO2: 18 mmol/L — ABNORMAL LOW (ref 22–32)
Calcium: 7.8 mg/dL — ABNORMAL LOW (ref 8.9–10.3)
Chloride: 108 mmol/L (ref 98–111)
Creatinine, Ser: 3 mg/dL — ABNORMAL HIGH (ref 0.44–1.00)
GFR calc Af Amer: 16 mL/min — ABNORMAL LOW (ref >60)
GFR calc non Af Amer: 14 mL/min — ABNORMAL LOW (ref >60)
Glucose, Bld: 143 mg/dL — ABNORMAL HIGH (ref 70–99)
Potassium: 4.8 mmol/L (ref 3.5–5.1)
Sodium: 137 mmol/L (ref 135–145)

## 2019-03-09 LAB — URINALYSIS, ROUTINE W REFLEX MICROSCOPIC
Bilirubin Urine: NEGATIVE
Glucose, UA: NEGATIVE mg/dL
Hgb urine dipstick: NEGATIVE
Ketones, ur: NEGATIVE mg/dL
Leukocytes,Ua: NEGATIVE
Nitrite: NEGATIVE
Protein, ur: 100 mg/dL — AB
Specific Gravity, Urine: 1.013 (ref 1.005–1.030)
pH: 5 (ref 5.0–8.0)

## 2019-03-09 LAB — PROTIME-INR
INR: 1.1 (ref 0.8–1.2)
Prothrombin Time: 13.9 seconds (ref 11.4–15.2)

## 2019-03-09 LAB — LACTIC ACID, PLASMA: Lactic Acid, Venous: 1.6 mmol/L (ref 0.5–1.9)

## 2019-03-09 LAB — TSH: TSH: 1.448 u[IU]/mL (ref 0.350–4.500)

## 2019-03-09 LAB — APTT: aPTT: 22 seconds — ABNORMAL LOW (ref 24–36)

## 2019-03-09 MED ORDER — MIDAZOLAM HCL 2 MG/2ML IJ SOLN
2.0000 mg | Freq: Once | INTRAMUSCULAR | Status: AC
  Administered 2019-03-09: 23:00:00 2 mg via INTRAVENOUS
  Filled 2019-03-09: qty 2

## 2019-03-09 MED ORDER — SODIUM CHLORIDE 0.9 % IV BOLUS
1000.0000 mL | Freq: Once | INTRAVENOUS | Status: AC
  Administered 2019-03-09: 1000 mL via INTRAVENOUS

## 2019-03-09 MED ORDER — DEXTROSE 5 % IV SOLN
800.0000 mg | Freq: Three times a day (TID) | INTRAVENOUS | Status: DC
  Administered 2019-03-10: 800 mg via INTRAVENOUS
  Filled 2019-03-09 (×3): qty 16

## 2019-03-09 MED ORDER — SODIUM CHLORIDE 0.9 % IV SOLN
1.0000 g | Freq: Three times a day (TID) | INTRAVENOUS | Status: DC
  Administered 2019-03-10: 1 g via INTRAVENOUS
  Filled 2019-03-09 (×3): qty 1

## 2019-03-09 MED ORDER — VANCOMYCIN HCL 1500 MG/300ML IV SOLN
1500.0000 mg | Freq: Once | INTRAVENOUS | Status: AC
  Administered 2019-03-10: 1500 mg via INTRAVENOUS
  Filled 2019-03-09: qty 300

## 2019-03-09 NOTE — ED Notes (Signed)
[phone] pts daughter Linus Orn please update

## 2019-03-09 NOTE — ED Triage Notes (Signed)
Pt bib [facility] from home w/ c/o AMS. Pt dx w/ covid 2 weeks ago per EMS and has been weak and lethargic since then. Pt AOx4 at baseline and AOx3 currently per EMS. Pt febrile w/ EMS w/ temp of 102.0. EMS VS:  BP 126/93 HR 95 SPO2 95% on RA RR 24 CBG 218 TEMP 102.0 F

## 2019-03-09 NOTE — ED Notes (Signed)
Provided update to patient's daughter. This pt is a [place] witness and is not to receive blood transfusions.

## 2019-03-09 NOTE — ED Provider Notes (Signed)
[facility] EMERGENCY DEPARTMENT Provider Note   CSN: [id number] Arrival date & time: 03/09/19  1903     History Chief Complaint  Patient presents with  . Altered Mental Status    [name] is a 79 y.o. female w/ hx of craniectomy (for frontal temporary brain meningioma resection by Dr Vertell Limber), prior stroke (left punctate pontine infarct, Feb 2018), HTN, COVID and AKI hospitalization in Feb 2021, presenting from home with AMS.  Per EMS the patient was reportedly in her home today with her daughter.  *Update from her daughter at bedside.  Her daughter reports that the patient has been progressively weaker and having difficulties with ambulation since her discharge from the hospital 2 to 3 weeks ago after admission for an AKI.  She was also admitted and treated for Covid at that time.  She never had any respiratory symptoms, it was an incidental finding.  She is returning home to the house the patient has been weak.  This is largely due to the fluid in her legs.  For the past several days the family is basically lifting her and helping her get around the house.  This morning the patient woke up at her baseline state was able to eat breakfast and converse with the family in the morning.  (At baseline she has some mild forgetfulness and confusion at times, but is normally able to hold a conversation can recognize family members.)  Around 12 pm the patient took a nap.  Around 2 pm the daughter woke her up, and noted she was having a lot of difficulty getting the patient to wake up and open her eyes.  She noted the patient's fingers and feet were trembling.  She said the patient's mouth was making a "chewing" motion.  She denies any known hx of seizure.  Daughter also reports patient been suffering from diarrhea for the past several days.  She reports the patient began having fevers approximately 5 days ago at home, they have been given her Tylenol for this.  She reports the  patient has frequent loose stools.  She gave her Imodium recently.  She denies that the patient has any history of C. difficile or has been on antibiotics recently.  On arrival in the ED, patient is no acute complaints.  She does appear confused and provides very poor history.  She repeats words that are spoken to her.   HPI     Past Medical History:  Diagnosis Date  . Allergic rhinitis   . Anemia   . Aortic stenosis 12/22/2012   Mild 3/14-2.35 m/s peak velocity  . Cataract 2018  . CHF (congestive heart failure) ([place])   . Chronic pain   . CKD (chronic kidney disease), stage III   . Diabetes mellitus   . Diverticulosis   . DVT (deep venous thrombosis) ([place])   . Glaucoma   . Hemorrhoids    internal  . HTN (hypertension)   . Hypercholesteremia   . Hyperlipidemia 12/22/2012  . Hypothyroidism   . Meningioma ([place]) 2008  . Osteoarthritis   . Peripheral neuropathy   . PPD positive    6 months ago  . PVC (premature ventricular contraction) 12/22/2012  . Rheumatoid arthritis(714.0)   . Sickle cell trait ([place])   . 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 Acute renal failure superimposed  on stage 3 chronic kidney disease ([place]) 02/15/2019  . Elevated troponin 02/15/2019  . LBBB (left bundle branch block) 02/15/2019  . Refusal of blood transfusions as patient is Jehovah's Witness   . Near syncope 02/12/2018  . Orthostatic hypotension 02/12/2018  . Symptomatic bradycardia 02/10/2018  . Primary osteoarthritis of right knee 09/25/2016  . 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 Elevated d-dimer   . Encephalopathy   . Sinus bradycardia   . Acute encephalopathy 02/27/2016  . 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 Bilateral hand pain 01/17/2016  . Bilateral wrist pain 01/17/2016  . Pain in right elbow 01/17/2016  . Left  shoulder pain 01/17/2016  . Encounter for therapeutic drug monitoring 11/02/2015  . Bronchiectasis without complication ([place]) 01/75/1025  . Chronic gout involving toe without tophus 10/30/2015  . High risk medication use 10/29/2015  . Gout 10/29/2015  . H/O total knee replacement, left 10/29/2015  . 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 Diabetes mellitus, type II, insulin dependent ([place]) 10/18/2015  . AKI (acute kidney injury) ([place]) 10/18/2015  . CKD (chronic kidney disease) stage 3, GFR 30-59 ml/min ([facility]) 10/18/2015  . Nodule of left lung 10/18/2015  . Neuropathy 08/22/2014  . Hypothyroidism 08/22/2014  . Chronic pain 08/22/2014  . Chronic congestive heart failure with left ventricular diastolic dysfunction ([place]) 08/08/2013  . Chest pain 08/05/2013  . Aortic stenosis 12/22/2012  . Hyperlipidemia 12/22/2012  . PVC (premature ventricular contraction) 12/22/2012  . HTN (hypertension) 12/22/2012  . 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 Open angle with borderline findings, high risk 12/15/2011  . Cataract, nuclear 05/25/2011    Past Surgical History:  Procedure Laterality Date  . ABDOMINAL HYSTERECTOMY  1978   TAH,& BSO 1  . CATARACT EXTRACTION Bilateral   . EYE SURGERY    . fistula repair  03/2010   partial colectomy  . intracranial surgery  11/2007   removal of meningioma  . JOINT REPLACEMENT  2001   left TKR  . LEFT HEART CATHETERIZATION WITH CORONARY ANGIOGRAM N/A 08/08/2013   Procedure: LEFT HEART CATHETERIZATION WITH CORONARY ANGIOGRAM;  Surgeon: Peter M Martinique, MD;  Location: [facility] CATH LAB;  Service: Cardiovascular;  Laterality: N/A;  . ROTATOR CUFF REPAIR  1999   BIlateral  . SIGMOIDECTOMY  2012  . TOE SURGERY Left 2018  . TONSILLECTOMY AND ADENOIDECTOMY    . TOTAL THYROIDECTOMY  1992  . wrist sx     right      OB History   No obstetric history on file.     Family History  Problem Relation Age of Onset  . Hypotension  Mother   . CVA Mother   . Diabetes Mother   . Hypertension Father   . Kidney failure Father     Social History   Tobacco Use  . Smoking status: Former Smoker    Packs/day: 0.10    Years: 2.00    Pack years: 0.20    Types: Cigarettes    Quit date: 01/06/1962    Years since quitting: 57.2  . Smokeless tobacco: Never Used  . Tobacco comment: Socially   Substance Use Topics  . Alcohol use: Yes    Comment: occasionally  . Drug use: No    Home Medications Prior to Admission medications   Medication Sig Start Date End Date Taking? Authorizing Provider  abatacept 750 mg in sodium chloride 0.9 % 70 mL Inject 750 mg into the vein every 28 (  twenty-eight) days. 09/21/18   Bo Merino, MD  acetaminophen (TYLENOL) 500 MG tablet Take 1,000 mg by mouth every 6 (six) hours as needed for mild pain.    [provider]  allopurinol (ZYLOPRIM) 300 MG tablet Take 1 tablet (300 mg total) by mouth daily. 03/31/17 05/06/19  Bo Merino, MD  aspirin EC 81 MG EC tablet Take 1 tablet (81 mg total) by mouth daily. 10/22/15   Dessa Phi, DO  Aspirin-Caffeine (BACK & BODY EXTRA STRENGTH) 500-32.5 MG TABS Take 1 tablet by mouth daily as needed (pain).    [provider]  atorvastatin (LIPITOR) 80 MG tablet Take 80 mg by mouth daily.  06/22/16   [provider]  BD INSULIN SYRINGE ULTRAFINE 31G X 15/64" 0.3 ML MISC  11/02/15   [provider]  Calcium Carb-Cholecalciferol (CALCIUM 600-D PO) Take 1 tablet by mouth daily.    [provider]  Cholecalciferol (VITAMIN D3) 10 MCG (400 UNIT) tablet Take 400 Units by mouth daily.    [provider]  clotrimazole-betamethasone (LOTRISONE) cream Apply 1 application topically daily as needed (rash/irritation).  05/29/16   [provider]  cyclobenzaprine (FLEXERIL) 10 MG tablet Take 10 mg by mouth 3 (three) times daily as needed for muscle spasms.     [provider]  ferrous sulfate  (FER-IN-SOL) 75 (15 Fe) MG/ML SOLN Take 45 mg of iron by mouth daily.    [provider]  HYDROcodone-acetaminophen (NORCO) 5-325 MG tablet Take 1 tablet by mouth every 6 (six) hours as needed for moderate pain. Patient taking differently: Take 1-2 tablets by mouth every 6 (six) hours as needed for moderate pain.  07/22/16   Landis Martins, DPM  Insulin Isophane & Regular Human (NOVOLIN 70/30 FLEXPEN RELION) (70-30) 100 UNIT/ML PEN Inject 6-8 Units into the skin 3 (three) times daily as needed (CBG >140).    [provider]  leflunomide (ARAVA) 10 MG tablet Take 1 tablet (10 mg total) by mouth daily. 12/16/18   Bo Merino, MD  levothyroxine (SYNTHROID) 150 MCG tablet Take 1 tablet (150 mcg total) by mouth daily before breakfast. 02/28/19   Caren Griffins, MD  pregabalin (LYRICA) 75 MG capsule 1 bid Patient taking differently: Take 75 mg by mouth 2 (two) times daily.  09/30/16   Bo Merino, MD  vitamin B-12 (CYANOCOBALAMIN) 50 MCG tablet Take 50 mcg by mouth daily.    [provider]    Allergies    Methotrexate derivatives, Actemra [tocilizumab], Other, Penicillins, Chlorhexidine, Remicade [infliximab], and Zocor [simvastatin]  Review of Systems   Review of Systems  Constitutional: Negative for chills and fever.  Respiratory: Negative for cough and shortness of breath.   Cardiovascular: Negative for chest pain and palpitations.  Gastrointestinal: Positive for diarrhea and nausea. Negative for vomiting.  Neurological: Negative for syncope, light-headedness and headaches.  All other systems reviewed and are negative.   Physical Exam Updated Vital Signs BP (!) 125/58   Pulse 85   Temp (!) 102.9 F (39.4 C) (Oral)   Resp (!) 21   Ht 5\' 4"  (1.626 m)   Wt 88 kg   LMP  (LMP Unknown)   SpO2 94%   BMI 33.30 kg/m   Physical Exam Vitals and nursing note reviewed.  Constitutional:      General: She is not in acute distress.    Appearance: She is  well-developed.  HENT:     Head: Normocephalic and atraumatic.  Eyes:     Extraocular Movements:  Extraocular movements intact.     Conjunctiva/sclera: Conjunctivae normal.     Pupils: Pupils are equal, round, and reactive to light.  Cardiovascular:     Rate and Rhythm: Normal rate and regular rhythm.     Pulses: Normal pulses.  Pulmonary:     Effort: Pulmonary effort is normal. No respiratory distress.  Abdominal:     General: There is no distension.     Palpations: Abdomen is soft.     Tenderness: There is abdominal tenderness in the right lower quadrant. There is no guarding or rebound.  Musculoskeletal:     Cervical back: Normal range of motion and neck supple. No rigidity.  Skin:    General: Skin is warm and dry.  Neurological:     Mental Status: She is alert.     GCS: GCS eye subscore is 4. GCS verbal subscore is 5. GCS motor subscore is 6.     Cranial Nerves: No cranial nerve deficit, dysarthria or facial asymmetry.     Sensory: Sensation is intact.     Motor: No pronator drift.     Comments: Slow speech, occasionally cuts off mid-sentence Follows commands with repeated coaching     ED Results / Procedures / Treatments   Labs (all labs ordered are listed, but only abnormal results are displayed) Labs Reviewed  BASIC METABOLIC PANEL - Abnormal; Notable for the following components:      Result Value   CO2 18 (*)    Glucose, Bld 143 (*)    Creatinine, Ser 3.00 (*)    Calcium 7.8 (*)    GFR calc non Af Amer 14 (*)    GFR calc Af Amer 16 (*)    All other components within normal limits  CBC WITH DIFFERENTIAL/PLATELET - Abnormal; Notable for the following components:   RBC 2.67 (*)    Hemoglobin 7.8 (*)    HCT 25.2 (*)    RDW 18.3 (*)    All other components within normal limits  URINALYSIS, ROUTINE W REFLEX MICROSCOPIC - Abnormal; Notable for the following components:   APPearance HAZY (*)    Protein, ur 100 (*)    Bacteria, UA RARE (*)    All other components  within normal limits  APTT - Abnormal; Notable for the following components:   aPTT 22 (*)    All other components within normal limits  TROPONIN I (HIGH SENSITIVITY) - Abnormal; Notable for the following components:   Troponin I (High Sensitivity) 35 (*)    All other components within normal limits  TROPONIN I (HIGH SENSITIVITY) - Abnormal; Notable for the following components:   Troponin I (High Sensitivity) 60 (*)    All other components within normal limits  CULTURE, BLOOD (ROUTINE X 2)  CULTURE, BLOOD (ROUTINE X 2)  URINE CULTURE  TSH  LACTIC ACID, PLASMA  PROTIME-INR  POC SARS CORONAVIRUS 2 AG -  ED    EKG EKG Interpretation  Date/Time:  Wednesday March 09 2019 20:26:37 EST Ventricular Rate:  89 PR Interval:    QRS Duration: 135 QT Interval:  397 QTC Calculation: 484 R Axis:   20 Text Interpretation: Sinus rhythm Left bundle branch block No STEMI ,no sig changes from Feb 2021 ECG Confirmed by Octaviano Glow [phone]) on 03/09/2019 9:21:18 PM   Radiology CT Head Wo Contrast  Result Date: 03/09/2019 CLINICAL DATA:  Altered mental status and increasing confusion, history of COVID-19 positivity EXAM: CT HEAD WITHOUT CONTRAST TECHNIQUE: Contiguous axial images were obtained from the base  of the skull through the vertex without intravenous contrast. COMPARISON:  02/10/2018 FINDINGS: Brain: Mild atrophic changes and chronic white matter ischemic changes are seen stable from the prior exam. No findings to suggest acute hemorrhage, acute infarction or space-occupying mass lesion are noted. Vascular: No hyperdense vessel or unexpected calcification. Skull: Changes consistent with prior craniotomy are noted in right temporoparietal region Sinuses/Orbits: No acute finding. Other: None. IMPRESSION: Chronic atrophic and ischemic changes without acute abnormality. Electronically Signed   By: Inez Catalina M.D.   On: 03/09/2019 21:10   DG Chest Portable 1 View  Result Date: 03/09/2019 CLINICAL  DATA:  Altered mental status. Possible pulmonary infection. EXAM: PORTABLE CHEST 1 VIEW COMPARISON:  February 14, 2019. FINDINGS: Stable mild cardiomegaly. Central pulmonary vascular congestion without overt pulmonary edema or obvious pleural effusion. Low lung volumes. Linear atelectasis in the left lower lung zone. No pneumonia. Moderate skeletal degenerative change and telemetry leads. IMPRESSION: Stable mild cardiomegaly with central pulmonary vascular congestion. No pneumonia or pulmonary edema. Electronically Signed   By: Revonda Humphrey   On: 03/09/2019 21:41    Procedures .Lumbar Puncture  Date/Time: 03/10/2019 12:00 AM Performed by: Wyvonnia Dusky, MD Authorized by: Wyvonnia Dusky, MD   Consent:    Consent obtained:  Written   Consent given by:  Guardian   Risks discussed:  Headache, bleeding, nerve damage, repeat procedure and infection   Alternatives discussed:  Delayed treatment and no treatment Pre-procedure details:    Procedure purpose:  Diagnostic   Preparation: Patient was prepped and draped in usual sterile fashion   Sedation:    Sedation type:  Anxiolysis (versed 2 mg) Anesthesia (see MAR for exact dosages):    Anesthesia method:  Local infiltration   Local anesthetic:  Lidocaine 1% w/o epi Procedure details:    Lumbar space:  L4-L5 interspace   Patient position:  R lateral decubitus   Needle gauge:  20   Needle length (in):  3.5   Ultrasound guidance: no     Number of attempts:  1 Post-procedure:    Puncture site:  Adhesive bandage applied and direct pressure applied Comments:     Procedure was not successful, limited by patient's large body habitus, unable to properly flex spine in lateral recumbent position.   (including critical care time)  Medications Ordered in ED Medications  acyclovir (ZOVIRAX) 800 mg in dextrose 5 % 150 mL IVPB (has no administration in time range)  meropenem (MERREM) 1 g in sodium chloride 0.9 % 100 mL IVPB (has no administration  in time range)  vancomycin (VANCOREADY) IVPB 1500 mg/300 mL (has no administration in time range)  sodium chloride 0.9 % bolus 1,000 mL (0 mLs Intravenous Stopped 03/09/19 2231)  midazolam (VERSED) injection 2 mg (2 mg Intravenous Given 03/09/19 2257)    ED Course  I have reviewed the triage vital signs and the nursing notes.  Pertinent labs & imaging results that were available during my care of the patient were reviewed by me and considered in my medical decision making (see chart for details).  This is an 79 year old female presenting to the emergency department with fever for 5 days and progressive confusion occurring this afternoon.  She was treated for Covid infection nearly 3 weeks ago in the hospital, although it is not clear that she was having an active infection at that time.  She had no respiratory symptoms.  She has been at home with her family since then, they have concerns that she is been  progressively weaker and seems to be retaining fluid in her lower extremities.  She began having fevers about 5 days ago.  Family also reports some diffuse stool.  She may need C. Diff check here.  She has an overall benign abdominal exam.  She has had a healthy appetite at home and ate breakfast normally this morning clinic banana grits.  Her lactate is normal.  This collectively lowers my suspicion for mesenteric ischemia or an acute surgical abdomen.  Her UA is negative here.  Will discuss possibility of seizure vs. meningitis with neurology.  Less likely bacterial meningitis with no photophobia or neck stiffness, no complaint of headache.  Can't exclude viral meningitis.   Finger/toe trembling and chewing motion may be subclinical seizure, but it's not clear at the moment.  Clinical Course as of Mar 04 0001  Wed Mar 09, 2019  2120  IMPRESSION: Chronic atrophic and ischemic changes without acute abnormality.   [MT]  2127 With no leukocytosis, normal lactate, doubt this is bacterial sepsis.   Unclear what the etiology of her fever is.  Will admit for confusion, elevated in Cr, low calcium.  Suspect mildly elevated trop likely 2/2 renal dysfunction, no ischemic findings on ecg   [MT]  2131 Back from [facility], no evidence of acute stroke.  On re-exmaination, she has no photophobia, no neck stiffness, I asked repeatedly if her head hurt and she says no.  Doubtful this is meningitis . Attempted to reach daughter by phone but no answer   [MT]  2211 No answer from daughter [name] on 2nd call   [MT]  2322 Patient seen by neurologist Dr Leonel Ramsay who recommended diagnostic LP and EEG.  I attempted bedside LP unsccessfully after consenting her daughter [name] at bedside.  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  We will treat empirically for possible meningitis, although per dr Leonel Ramsay, less likely bacterial meningitis with 5 days of fevers and this clinical presentation.  Will admit   [MT]  2323 Trops mildly elevated but she has been in the past as well.  Likely demand ischemia, doubtful ACS at this time.   [MT]  2323 No tachycardia or hypoxia to suggest PE.   [MT]  2340 Signout given to hospitalist   [MT]    Clinical Course User Index [MT] Tzipporah Nagorski, Carola Rhine, MD   Final Clinical Impression(s) / ED Diagnoses Final diagnoses:  Altered mental status, unspecified altered mental status type  Fever, unspecified fever cause    Rx / DC Orders ED Discharge Orders    None       Cormick Moss, Carola Rhine, MD 03/10/19 0001

## 2019-03-09 NOTE — Consult Note (Signed)
Neurology Consultation Reason for Consult: AMS Referring Physician: Trifan, M  CC: Possible seizures  History is obtained from: Daughter  HPI: [name] is a 79 y.o. female history of hypertension, hyperlipidemia, meningioma, rheumatoid arthritis who was in her normal state of health when she laid down for a nap earlier.  On awakening this afternoon, she was noted to be confused and have abnormal twitching.  Daughter states that she was not confused yesterday or the day before.  She has, however, been having fevers since last Friday.  She was admitted for Covid about a month ago, but has not been febrile for multiple weeks.   ROS: Unable to obtain due to altered mental status.   Past Medical History:  Diagnosis Date  . Allergic rhinitis   . Anemia   . Aortic stenosis 12/22/2012   Mild 3/14-2.35 m/s peak velocity  . Cataract 2018  . CHF (congestive heart failure) ([place])   . Chronic pain   . CKD (chronic kidney disease), stage III   . Diabetes mellitus   . Diverticulosis   . DVT (deep venous thrombosis) ([place])   . Glaucoma   . Hemorrhoids    internal  . HTN (hypertension)   . Hypercholesteremia   . Hyperlipidemia 12/22/2012  . Hypothyroidism   . Meningioma ([place]) 2008  . Osteoarthritis   . Peripheral neuropathy   . PPD positive    6 months ago  . PVC (premature ventricular contraction) 12/22/2012  . Rheumatoid arthritis(714.0)   . Sickle cell trait ([place])   . Tuberculosis    history of positive TB testing has yearly chest xrays in May / completed INH     Family History  Problem Relation Age of Onset  . Hypotension Mother   . CVA Mother   . Diabetes Mother   . Hypertension Father   . Kidney failure Father      Social History:  reports that she quit smoking about 57 years ago. Her smoking use included cigarettes. She has a 0.20 pack-year smoking history. She has never used smokeless tobacco. She reports current alcohol use. She reports that she does not use  drugs.   Exam: Current vital signs: BP (!) 125/58   Pulse 85   Temp (!) 102.9 F (39.4 C) (Oral)   Resp (!) 21   Ht 5\' 4"  (1.626 m)   Wt 88 kg   LMP  (LMP Unknown)   SpO2 94%   BMI 33.30 kg/m  Vital signs in last 24 hours: Temp:  [102.9 F (39.4 C)] 102.9 F (39.4 C) (03/03 1907) Pulse Rate:  [80-90] 85 (03/03 2115) Resp:  [14-24] 21 (03/03 2115) BP: (124-141)/(54-90) 125/58 (03/03 2115) SpO2:  [94 %-99 %] 94 % (03/03 2115) Weight:  [88 kg] 88 kg (03/03 2129)   Physical Exam  Constitutional: Appears well-developed and well-nourished.  Psych: Affect appropriate to situation Eyes: No scleral injection HENT: No OP obstrucion MSK: no joint deformities.  Cardiovascular: Normal rate and regular rhythm.  Respiratory: Effort normal, non-labored breathing GI: Soft.  No distension. There is no tenderness.  Skin: WDI  Neuro: Mental Status: Patient is awake, alert, she is able to tell me her name, but does not answer all questions.  She requires repeated prompting and some noxious stimulation to Cranial Nerves: II: Visual Fields are full. Pupils are equal, round, and reactive to light.   III,IV, VI: EOMI without ptosis or diploplia.  V: Facial sensation is symmetric to temperature VII: Facial movement is symmetric.  VIII: hearing is intact to voice Motor: She moves all extremities to command, though she does not  comply with formal strength testing.  She has frequent asterixis and polymyoclonus. Sensory: She responds to noxious stimulation in all 4 extremities Cerebellar: She does not comply   I have reviewed labs in epic and the results pertinent to this consultation are: Creatinine 3.0  I have reviewed the images obtained: CT head-unremarkable  Impression: 79 year old female presenting with polymyoclonus, fever of unclear etiology and altered mental status.  LP was negative at the bedside and therefore she has been started on broad-spectrum antibiotics.  Given the  fevers been present for 5 days, my suspicion for bacterial meningitis is low, but unless another cause of fever is found, LP to assess for viral meningitis/encephalitis would be reasonable.  Polymyoclonus is most often the result of toxic/metabolic insults.  She does have an elevation in her creatinine, and I wonder if this coupled with her fever and possibly contributed to by narcotics/Lyrica.  A stat EEG was performed to rule out seizure as an etiology, and continuous monitoring is currently being performed.  Recommendations: 1) LP for cells, glucose, protein 2) discontinue Lyrica, narcotics 3) ammonia 4) if confusion continues, consider MRI.  Roland Rack, MD [facility] [phone]  If 7pm- 7am, please page neurology on call as listed in [place].

## 2019-03-10 ENCOUNTER — Emergency Department (HOSPITAL_COMMUNITY): Payer: Medicare Other

## 2019-03-10 ENCOUNTER — Encounter (HOSPITAL_COMMUNITY): Payer: Self-pay | Admitting: Family Medicine

## 2019-03-10 ENCOUNTER — Inpatient Hospital Stay (HOSPITAL_COMMUNITY): Payer: Medicare Other

## 2019-03-10 DIAGNOSIS — R509 Fever, unspecified: Secondary | ICD-10-CM

## 2019-03-10 DIAGNOSIS — R339 Retention of urine, unspecified: Secondary | ICD-10-CM | POA: Diagnosis not present

## 2019-03-10 DIAGNOSIS — K6389 Other specified diseases of intestine: Secondary | ICD-10-CM | POA: Diagnosis not present

## 2019-03-10 DIAGNOSIS — E1122 Type 2 diabetes mellitus with diabetic chronic kidney disease: Secondary | ICD-10-CM | POA: Diagnosis present

## 2019-03-10 DIAGNOSIS — D649 Anemia, unspecified: Secondary | ICD-10-CM

## 2019-03-10 DIAGNOSIS — R7989 Other specified abnormal findings of blood chemistry: Secondary | ICD-10-CM

## 2019-03-10 DIAGNOSIS — K559 Vascular disorder of intestine, unspecified: Secondary | ICD-10-CM | POA: Diagnosis present

## 2019-03-10 DIAGNOSIS — G9340 Encephalopathy, unspecified: Secondary | ICD-10-CM | POA: Diagnosis not present

## 2019-03-10 DIAGNOSIS — Z66 Do not resuscitate: Secondary | ICD-10-CM | POA: Diagnosis present

## 2019-03-10 DIAGNOSIS — E1142 Type 2 diabetes mellitus with diabetic polyneuropathy: Secondary | ICD-10-CM | POA: Diagnosis present

## 2019-03-10 DIAGNOSIS — N136 Pyonephrosis: Secondary | ICD-10-CM | POA: Diagnosis present

## 2019-03-10 DIAGNOSIS — I2510 Atherosclerotic heart disease of native coronary artery without angina pectoris: Secondary | ICD-10-CM | POA: Diagnosis present

## 2019-03-10 DIAGNOSIS — N1330 Unspecified hydronephrosis: Secondary | ICD-10-CM | POA: Diagnosis not present

## 2019-03-10 DIAGNOSIS — G40909 Epilepsy, unspecified, not intractable, without status epilepticus: Secondary | ICD-10-CM | POA: Diagnosis present

## 2019-03-10 DIAGNOSIS — N2889 Other specified disorders of kidney and ureter: Secondary | ICD-10-CM | POA: Diagnosis not present

## 2019-03-10 DIAGNOSIS — D573 Sickle-cell trait: Secondary | ICD-10-CM | POA: Diagnosis present

## 2019-03-10 DIAGNOSIS — Z531 Procedure and treatment not carried out because of patient's decision for reasons of belief and group pressure: Secondary | ICD-10-CM | POA: Diagnosis not present

## 2019-03-10 DIAGNOSIS — D631 Anemia in chronic kidney disease: Secondary | ICD-10-CM | POA: Diagnosis present

## 2019-03-10 DIAGNOSIS — I350 Nonrheumatic aortic (valve) stenosis: Secondary | ICD-10-CM | POA: Diagnosis present

## 2019-03-10 DIAGNOSIS — G92 Toxic encephalopathy: Secondary | ICD-10-CM | POA: Diagnosis present

## 2019-03-10 DIAGNOSIS — A419 Sepsis, unspecified organism: Secondary | ICD-10-CM | POA: Diagnosis present

## 2019-03-10 DIAGNOSIS — I5032 Chronic diastolic (congestive) heart failure: Secondary | ICD-10-CM | POA: Diagnosis present

## 2019-03-10 DIAGNOSIS — N179 Acute kidney failure, unspecified: Secondary | ICD-10-CM | POA: Diagnosis present

## 2019-03-10 DIAGNOSIS — D6489 Other specified anemias: Secondary | ICD-10-CM | POA: Diagnosis not present

## 2019-03-10 DIAGNOSIS — B3741 Candidal cystitis and urethritis: Secondary | ICD-10-CM | POA: Diagnosis present

## 2019-03-10 DIAGNOSIS — Z515 Encounter for palliative care: Secondary | ICD-10-CM | POA: Diagnosis not present

## 2019-03-10 DIAGNOSIS — N184 Chronic kidney disease, stage 4 (severe): Secondary | ICD-10-CM | POA: Diagnosis present

## 2019-03-10 DIAGNOSIS — D638 Anemia in other chronic diseases classified elsewhere: Secondary | ICD-10-CM | POA: Diagnosis present

## 2019-03-10 DIAGNOSIS — R109 Unspecified abdominal pain: Secondary | ICD-10-CM | POA: Diagnosis not present

## 2019-03-10 DIAGNOSIS — Z8616 Personal history of COVID-19: Secondary | ICD-10-CM | POA: Diagnosis not present

## 2019-03-10 DIAGNOSIS — E872 Acidosis: Secondary | ICD-10-CM | POA: Diagnosis not present

## 2019-03-10 DIAGNOSIS — I358 Other nonrheumatic aortic valve disorders: Secondary | ICD-10-CM | POA: Diagnosis not present

## 2019-03-10 DIAGNOSIS — U071 COVID-19: Secondary | ICD-10-CM | POA: Diagnosis not present

## 2019-03-10 DIAGNOSIS — R4182 Altered mental status, unspecified: Secondary | ICD-10-CM | POA: Diagnosis not present

## 2019-03-10 DIAGNOSIS — K529 Noninfective gastroenteritis and colitis, unspecified: Secondary | ICD-10-CM | POA: Diagnosis not present

## 2019-03-10 DIAGNOSIS — E440 Moderate protein-calorie malnutrition: Secondary | ICD-10-CM | POA: Diagnosis not present

## 2019-03-10 DIAGNOSIS — I700 Atherosclerosis of aorta: Secondary | ICD-10-CM | POA: Diagnosis present

## 2019-03-10 DIAGNOSIS — I447 Left bundle-branch block, unspecified: Secondary | ICD-10-CM | POA: Diagnosis present

## 2019-03-10 DIAGNOSIS — I130 Hypertensive heart and chronic kidney disease with heart failure and stage 1 through stage 4 chronic kidney disease, or unspecified chronic kidney disease: Secondary | ICD-10-CM | POA: Diagnosis present

## 2019-03-10 DIAGNOSIS — J90 Pleural effusion, not elsewhere classified: Secondary | ICD-10-CM | POA: Diagnosis not present

## 2019-03-10 DIAGNOSIS — Y92230 Patient room in hospital as the place of occurrence of the external cause: Secondary | ICD-10-CM | POA: Diagnosis not present

## 2019-03-10 DIAGNOSIS — Z7189 Other specified counseling: Secondary | ICD-10-CM | POA: Diagnosis not present

## 2019-03-10 DIAGNOSIS — A09 Infectious gastroenteritis and colitis, unspecified: Secondary | ICD-10-CM | POA: Diagnosis present

## 2019-03-10 LAB — BASIC METABOLIC PANEL
Anion gap: 14 (ref 5–15)
BUN: 19 mg/dL (ref 8–23)
CO2: 13 mmol/L — ABNORMAL LOW (ref 22–32)
Calcium: 7.8 mg/dL — ABNORMAL LOW (ref 8.9–10.3)
Chloride: 112 mmol/L — ABNORMAL HIGH (ref 98–111)
Creatinine, Ser: 2.78 mg/dL — ABNORMAL HIGH (ref 0.44–1.00)
GFR calc Af Amer: 18 mL/min — ABNORMAL LOW (ref >60)
GFR calc non Af Amer: 15 mL/min — ABNORMAL LOW (ref >60)
Glucose, Bld: 98 mg/dL (ref 70–99)
Potassium: 6 mmol/L — ABNORMAL HIGH (ref 3.5–5.1)
Sodium: 139 mmol/L (ref 135–145)

## 2019-03-10 LAB — AMMONIA: Ammonia: 83 umol/L — ABNORMAL HIGH (ref 9–35)

## 2019-03-10 LAB — GLUCOSE, CAPILLARY
Glucose-Capillary: 89 mg/dL (ref 70–99)
Glucose-Capillary: 108 mg/dL — ABNORMAL HIGH (ref 70–99)
Glucose-Capillary: 130 mg/dL — ABNORMAL HIGH (ref 70–99)

## 2019-03-10 LAB — POC SARS CORONAVIRUS 2 AG -  ED: SARS Coronavirus 2 Ag: NEGATIVE

## 2019-03-10 LAB — URINE CULTURE: Culture: >=100000 — AB

## 2019-03-10 LAB — TSH: TSH: 0.857 u[IU]/mL (ref 0.350–4.500)

## 2019-03-10 LAB — POTASSIUM: Potassium: 4.7 mmol/L (ref 3.5–5.1)

## 2019-03-10 LAB — CORTISOL: Cortisol, Plasma: 30.8 ug/dL

## 2019-03-10 LAB — BRAIN NATRIURETIC PEPTIDE: B Natriuretic Peptide: 1628.4 pg/mL — ABNORMAL HIGH (ref 0.0–100.0)

## 2019-03-10 LAB — MAGNESIUM: Magnesium: 1.4 mg/dL — ABNORMAL LOW (ref 1.7–2.4)

## 2019-03-10 LAB — D-DIMER, QUANTITATIVE: D-Dimer, Quant: 11.89 ug/mL-FEU — ABNORMAL HIGH (ref 0.00–0.50)

## 2019-03-10 LAB — C-REACTIVE PROTEIN: CRP: 25.7 mg/dL — ABNORMAL HIGH (ref <1.0)

## 2019-03-10 MED ORDER — LEVETIRACETAM 500 MG PO TABS: 500.0000 mg | ORAL_TABLET | Freq: Two times a day (BID) | ORAL | Status: DC

## 2019-03-10 MED ORDER — ALBUTEROL SULFATE HFA 108 (90 BASE) MCG/ACT IN AERS: 2.0000 | INHALATION_SPRAY | Freq: Four times a day (QID) | RESPIRATORY_TRACT | Status: DC | PRN

## 2019-03-10 MED ORDER — MAGNESIUM SULFATE 2 GM/50ML IV SOLN
2.0000 g | Freq: Once | INTRAVENOUS | Status: AC
  Administered 2019-03-10: 2 g via INTRAVENOUS
  Filled 2019-03-10: qty 50

## 2019-03-10 MED ORDER — MAGNESIUM SULFATE IN D5W 1-5 GM/100ML-% IV SOLN
1.0000 g | Freq: Once | INTRAVENOUS | Status: DC
  Filled 2019-03-10: qty 100

## 2019-03-10 MED ORDER — LACTULOSE 10 GM/15ML PO SOLN: 20.0000 g | Freq: Two times a day (BID) | ORAL | Status: DC

## 2019-03-10 MED ORDER — ACETAMINOPHEN 325 MG RE SUPP
500.0000 mg | Freq: Four times a day (QID) | RECTAL | Status: DC | PRN
  Administered 2019-03-10: 487.5 mg via RECTAL
  Filled 2019-03-10 (×2): qty 2

## 2019-03-10 MED ORDER — LEVETIRACETAM 100 MG/ML PO SOLN
500.0000 mg | Freq: Two times a day (BID) | ORAL | Status: DC
  Administered 2019-03-10 – 2019-03-22 (×24): 500 mg via ORAL
  Filled 2019-03-10 (×24): qty 5

## 2019-03-10 MED ORDER — SODIUM CHLORIDE 0.9 % IV SOLN
1.0000 g | Freq: Two times a day (BID) | INTRAVENOUS | Status: DC
  Administered 2019-03-10 – 2019-03-11 (×2): 1 g via INTRAVENOUS
  Filled 2019-03-10 (×4): qty 1

## 2019-03-10 MED ORDER — LACTATED RINGERS IV SOLN: INTRAVENOUS | Status: AC

## 2019-03-10 MED ORDER — HEPARIN SODIUM (PORCINE) 5000 UNIT/ML IJ SOLN
5000.0000 [IU] | Freq: Three times a day (TID) | INTRAMUSCULAR | Status: DC
  Administered 2019-03-10 – 2019-03-12 (×6): 5000 [IU] via SUBCUTANEOUS
  Filled 2019-03-10 (×6): qty 1

## 2019-03-10 MED ORDER — SODIUM CHLORIDE 0.9 % IV BOLUS
250.0000 mL | Freq: Once | INTRAVENOUS | Status: AC
  Administered 2019-03-10: 15:00:00 250 mL via INTRAVENOUS

## 2019-03-10 MED ORDER — INSULIN ASPART 100 UNIT/ML ~~LOC~~ SOLN
0.0000 [IU] | Freq: Three times a day (TID) | SUBCUTANEOUS | Status: DC
  Administered 2019-03-13 – 2019-03-14 (×3): 1 [IU] via SUBCUTANEOUS
  Administered 2019-03-15: 2 [IU] via SUBCUTANEOUS
  Administered 2019-03-16: 3 [IU] via SUBCUTANEOUS
  Administered 2019-03-17: 5 [IU] via SUBCUTANEOUS
  Administered 2019-03-18: 2 [IU] via SUBCUTANEOUS
  Administered 2019-03-18 – 2019-03-19 (×3): 1 [IU] via SUBCUTANEOUS
  Administered 2019-03-19: 3 [IU] via SUBCUTANEOUS
  Administered 2019-03-20: 1 [IU] via SUBCUTANEOUS
  Administered 2019-03-20 – 2019-03-21 (×2): 2 [IU] via SUBCUTANEOUS

## 2019-03-10 MED ORDER — SODIUM POLYSTYRENE SULFONATE 15 GM/60ML PO SUSP
30.0000 g | Freq: Once | ORAL | Status: DC
  Filled 2019-03-10: qty 120

## 2019-03-10 MED ORDER — LEVOTHYROXINE SODIUM 100 MCG/5ML IV SOLN
75.0000 ug | Freq: Every day | INTRAVENOUS | Status: DC
  Administered 2019-03-10 – 2019-03-12 (×3): 75 ug via INTRAVENOUS
  Filled 2019-03-10 (×3): qty 5

## 2019-03-10 MED ORDER — TAMSULOSIN HCL 0.4 MG PO CAPS
0.4000 mg | ORAL_CAPSULE | Freq: Every day | ORAL | Status: DC
  Administered 2019-03-10 – 2019-03-22 (×12): 0.4 mg via ORAL
  Filled 2019-03-10 (×13): qty 1

## 2019-03-10 MED ORDER — DEXTROSE 5 % IV SOLN
800.0000 mg | INTRAVENOUS | Status: DC
  Administered 2019-03-11: 800 mg via INTRAVENOUS
  Filled 2019-03-10 (×2): qty 16

## 2019-03-10 MED ORDER — VANCOMYCIN VARIABLE DOSE PER UNSTABLE RENAL FUNCTION (PHARMACIST DOSING): Status: DC

## 2019-03-10 MED ORDER — FUROSEMIDE 10 MG/ML IJ SOLN
20.0000 mg | Freq: Once | INTRAMUSCULAR | Status: AC
  Administered 2019-03-10: 20 mg via INTRAVENOUS
  Filled 2019-03-10: qty 2

## 2019-03-10 MED ORDER — SODIUM POLYSTYRENE SULFONATE 15 GM/60ML PO SUSP
30.0000 g | Freq: Once | ORAL | Status: AC
  Administered 2019-03-10: 30 g via RECTAL
  Filled 2019-03-10: qty 120

## 2019-03-10 NOTE — Progress Notes (Signed)
Bilateral lower extremity venous duplex has been completed. Preliminary results can be found in CV Proc through chart review.   03/10/19 2:43 PM [name] RVT

## 2019-03-10 NOTE — ED Notes (Signed)
Admitting paged to RN per his request  

## 2019-03-10 NOTE — Plan of Care (Addendum)
Patient received to room 09 on [place] from stretcher to bed x 4 assist. Continues EEG in progress, patient alert and oriented to self and place. Unable to recall situation and month/year. Soft brown BM noted and pericare provided. [place] placed. Lower abdominal area, left upper arm bruised with small hard areas. 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 02 sat 93-97% on RA.  Message sent to MD for EEG clarification. Patient received with continues EEG and no floor order.  Call back received from [facility] MD to discontinue EEG.

## 2019-03-10 NOTE — Procedures (Addendum)
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 EEG to evaluate for seizure.  Level of alertness: awake/lethargic  AEDs during EEG study: None  Technical aspects: This EEG study was done with scalp electrodes positioned according to the 10-20 International system of electrode placement. Electrical activity was acquired at a sampling rate of 500Hz  and reviewed with a high frequency filter of 70Hz  and a low frequency filter of 1Hz . EEG data were recorded continuously and digitally stored.   DESCRIPTION: No clear posterior dominant rhythm was seen. EEG showed continuous generalized and maximal right frontocentral region. Sharp waves were also seen in right frontocentral region. Hyperventilation and photic stimulation were not performed.  Event button was pressed on 03/10/2019 at 1016 for unclear reason. Cocomitant eeg before, during and after the event didn't show any eeg change to suggest seizure.  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 No seizures were seen throughout the recording.  Event button was pressed on 03/10/2019 at 1016 for unclear reason. Cocomitant eeg before, during and after the event didn't show any eeg change to suggest seizure.  [name] [name]

## 2019-03-10 NOTE — Progress Notes (Addendum)
PROGRESS NOTE                                                                                                                                                                                                             Patient Demographics:    [name], is a 79 y.o. female, DOB - 1940-07-23, LTR:[id number]  Outpatient Primary MD for the patient is Jonathon Jordan, MD    LOS - 0  Admit date - 03/09/2019    Chief Complaint  Patient presents with  . Altered Mental Status       Brief Narrative   [name] is a 79 y.o. female with PMH hypothyroidism, hx of craniectomy(for frontal temporary brain meningioma resection), CKD 4, rheumatoid arthritis, DM chronic anemia, chronic pain, recent COVID infection for which she was treated to [facility] and discharged few weeks ago, aortic stenosis.  Who lives at home and lately was having some diarrhea and generalized weakness, had multiple falls at home around 3-4 and thereafter became increasingly confused.  Was brought to the ER where she was found to have low-grade fever, had toxic and metabolic encephalopathy, dehydration, neurology was consulted.  She was started on empiric treatment for meningitis and admitted to the hospital.   Subjective:    [name] mildly confused although able to answer a few basic questions but still unreliable historian.  Denies any headache at this time.   Assessment  & Plan :    1.  Toxic and metabolic encephalopathy.  Multiple falls at home, reported headache prior to admission.  Low-grade fever.  Seen by neurology, head CT unremarkable, suspicion for meningitis.  LP in the ER failed, IR requested to do LP.  Appropriate tests ordered.  Currently on empiric broad-spectrum antibiotics along with IV acyclovir.  Patient's mentation has slightly improved.  EEG does showevidence of epileptogenicity and cortical dysfunction in right frontocentral  region. Placed on Keppra by Neuro ( prelim EEG was -ve) .  She also appears to be dehydrated and physical exam suggested massively distended bladder which could also account for some encephalopathy.  Will have PT OT and speech see her.  Currently n.p.o. with gentle fluids.  2.  Recent COVID-19 pneumonia.  Fully treated.  She is out of quarantine period.   SpO2: 95 %  Recent Labs  Lab 03/10/19 0923  CRP  25.7*  DDIMER 11.89*  BNP 1,628.4*    Hepatic Function Latest Ref Rng & Units 02/27/2019 02/26/2019 02/25/2019  Total Protein 6.5 - 8.1 g/dL - - -  Albumin 3.5 - 5.0 g/dL 2.8(L) 2.5(L) 2.4(L)  AST 15 - 41 U/L - - -  ALT 0 - 44 U/L - - -  Alk Phosphatase 38 - 126 U/L - - -  Total Bilirubin 0.3 - 1.2 mg/dL - - -    3.  CKD 4.  Baseline creatinine around 2.6.  Gently hydrate, Foley as she has urinary retention.  Outpatient nephrology follow-up.  4.  Urinary retention.  Flomax and Foley.  5.  Hypothyroidism.  Check TSH and cortisol, IV Synthroid.  Lab Results  Component Value Date   TSH 1.448 03/09/2019    6.  History of meningioma with craniotomy in the past.  Supportive care.  Head CT nonacute.  7.  Dyslipidemia.  Once taking oral medications consistently will start home dose statin.  8.  History of moderate to severe AS.  Supportive care.  No acute issues.  Will require outpatient cardiology follow-up.  Could be a TAVR candidate.  9.  Elevated D-dimer with history of DVT.  Will check lower extremity venous duplex.  10.  Patient and family do not take blood transfusions.  11. Hyperkalemia and Hypomagnesemia - treated.     Condition - Extremely Guarded  Family Communication  : Daughter updated in detail on 03/10/2019  Code Status : No intubation  Diet :    Diet Order            Diet NPO time specified  Diet effective now               Disposition Plan  : Patient in PCU getting worked up for encephalopathy, possible meningitis.  Needs MRI, LP, IV  antibiotics.  Consults  : Neurology  Procedures  :    CT head.  Nonacute.  EEG.  Final - This study showed evidence of epileptogenicity and cortical dysfunction in right frontocentral region. No seizures were seen throughout the recording.  MRI brain  Lower extremity venous duplex.    LP requested by IR  PUD Prophylaxis : none  DVT Prophylaxis  :    Heparin   Lab Results  Component Value Date   PLT 210 03/09/2019    Inpatient Medications  Scheduled Meds: . heparin  5,000 Units Subcutaneous Q8H  . insulin aspart  0-9 Units Subcutaneous TID WC  . levothyroxine  75 mcg Intravenous Daily  . tamsulosin  0.4 mg Oral Daily  . vancomycin variable dose per unstable renal function (pharmacist dosing)   Does not apply See admin instructions   Continuous Infusions: . acyclovir Stopped (03/10/19 0324)  . lactated ringers    . meropenem (MERREM) IV Stopped (03/10/19 0324)   PRN Meds:.albuterol  Antibiotics  :    Anti-infectives (From admission, onward)   Start     Dose/Rate Route Frequency Ordered Stop   03/10/19 0032  vancomycin variable dose per unstable renal function (pharmacist dosing)      Does not apply See admin instructions 03/10/19 0032     03/09/19 2330  acyclovir (ZOVIRAX) 800 mg in dextrose 5 % 150 mL IVPB     800 mg 166 mL/hr over 60 Minutes Intravenous Every 8 hours 03/09/19 2321     03/09/19 2330  meropenem (MERREM) 1 g in sodium chloride 0.9 % 100 mL IVPB     1 g 200 mL/hr  over 30 Minutes Intravenous Every 8 hours 03/09/19 2324     03/09/19 2330  vancomycin (VANCOREADY) IVPB 1500 mg/300 mL     1,500 mg 150 mL/hr over 120 Minutes Intravenous  Once 03/09/19 2324 03/10/19 0601       Time Spent in minutes  30   Lala Lund M.D on 03/10/2019 at 11:11 AM  To page go to [email or web address] - password [email or web address]  [facility] -  Office  [phone]    See all Orders from today for further details    Objective:   Vitals:   03/10/19 0424 03/10/19 0800  03/10/19 0900 03/10/19 1000  BP: 139/61     Pulse: (!) 103     Resp: 20  18   Temp: 100.2 F (37.9 C)     TempSrc: Axillary     SpO2: 94% 95% 98% 95%  Weight: 80.9 kg     Height: 5' 4"  (1.626 m)       Wt Readings from Last 3 Encounters:  03/10/19 80.9 kg  02/27/19 88 kg  01/12/19 82.1 kg    No intake or output data in the 24 hours ending 03/10/19 1111   Physical Exam   Somnolent but arousable, answers basic questions, generalized weakness in all 4 extremities, able to move all extremities to verbal commands.   [place].AT,PERRAL Supple Neck,No JVD, No cervical lymphadenopathy appriciated.  Symmetrical Chest wall movement, Good air movement bilaterally, CTAB RRR,No Gallops,Rubs , positive aortic systolic murmur, No Parasternal Heave +ve B.Sounds, Abd is distended with suprapubic fullness, No tenderness, No organomegaly appriciated, No rebound - guarding or rigidity. No Cyanosis, Clubbing or edema, No new Rash or bruise       Data Review:    CBC Recent Labs  Lab 03/09/19 1944  WBC 8.2  HGB 7.8*  HCT 25.2*  PLT 210  MCV 94.4  MCH 29.2  MCHC 31.0  RDW 18.3*  LYMPHSABS 1.7  MONOABS 0.5  EOSABS 0.4  BASOSABS 0.0    Chemistries  Recent Labs  Lab 03/09/19 1944 03/10/19 0923  NA 137 139  K 4.8 6.0*  CL 108 112*  CO2 18* 13*  GLUCOSE 143* 98  BUN 19 19  CREATININE 3.00* 2.78*  CALCIUM 7.8* 7.8*  MG  --  1.4*   ------------------------------------------------------------------------------------------------------------------ No results for input(s): CHOL, HDL, LDLCALC, TRIG, CHOLHDL, LDLDIRECT in the last 72 hours.  Lab Results  Component Value Date   HGBA1C 6.6 (H) 02/15/2019   ------------------------------------------------------------------------------------------------------------------ Recent Labs    03/09/19 1944  TSH 1.448    Cardiac Enzymes No results for input(s): CKMB, TROPONINI, MYOGLOBIN in the last 168 hours.  Invalid input(s):  CK ------------------------------------------------------------------------------------------------------------------    Component Value Date/Time   BNP 1,628.4 (H) 03/10/2019 3893    Micro Results Recent Results (from the past 240 hour(s))  Blood Culture (routine x 2)     Status: None (Preliminary result)   Collection Time: 03/09/19  7:44 PM   Specimen: BLOOD  Result Value Ref Range Status   Specimen Description BLOOD RIGHT ANTECUBITAL  Final   Special Requests   Final    BOTTLES DRAWN AEROBIC AND ANAEROBIC Blood Culture results may not be optimal due to an inadequate volume of blood received in culture bottles   Culture   Final    NO GROWTH < 12 HOURS Performed at [facility], [place] [address] [place], [place] [address]    Report Status PENDING  Incomplete  Blood Culture (routine x 2)  Status: None (Preliminary result)   Collection Time: 03/09/19  7:49 PM   Specimen: BLOOD  Result Value Ref Range Status   Specimen Description BLOOD LEFT ANTECUBITAL  Final   Special Requests   Final    BOTTLES DRAWN AEROBIC AND ANAEROBIC Blood Culture results may not be optimal due to an inadequate volume of blood received in culture bottles   Culture   Final    NO GROWTH < 12 HOURS Performed at [facility], [address] [place], [place] [address]    Report Status PENDING  Incomplete    Radiology Reports CT Head Wo Contrast  Result Date: 03/09/2019 CLINICAL DATA:  Altered mental status and increasing confusion, history of COVID-19 positivity EXAM: CT HEAD WITHOUT CONTRAST TECHNIQUE: Contiguous axial images were obtained from the base of the skull through the vertex without intravenous contrast. COMPARISON:  02/10/2018 FINDINGS: Brain: Mild atrophic changes and chronic white matter ischemic changes are seen stable from the prior exam. No findings to suggest acute hemorrhage, acute infarction or space-occupying mass lesion are noted. Vascular: No hyperdense vessel or unexpected  calcification. Skull: Changes consistent with prior craniotomy are noted in right temporoparietal region Sinuses/Orbits: No acute finding. Other: None. IMPRESSION: Chronic atrophic and ischemic changes without acute abnormality. Electronically Signed   By: Inez Catalina M.D.   On: 03/09/2019 21:10   US RENAL  Result Date: 02/16/2019 CLINICAL DATA:  Acute kidney injury. EXAM: RENAL / URINARY TRACT ULTRASOUND COMPLETE COMPARISON:  None. FINDINGS: Right Kidney: Renal measurements: 8.7 x 5.3 x 5.2 cm = volume: 123 mL. 1.6 cm cyst is noted in upper pole. Increased echogenicity of renal parenchyma is noted. No mass or hydronephrosis visualized. Left Kidney: Renal measurements: 10.4 x 5.8 x 5.8 cm = volume: 183 mL. 2.1 cm cyst is seen in lower pole. Increased echogenicity of renal parenchyma is noted. No mass or hydronephrosis visualized. Bladder: Appears normal for degree of bladder distention. Other: None. IMPRESSION: Increased echogenicity of renal parenchyma is noted bilaterally consistent with medical renal disease. No hydronephrosis or renal obstruction is noted. Electronically Signed   By: Marijo Conception M.D.   On: 02/16/2019 09:43   DG Chest Portable 1 View  Result Date: 03/09/2019 CLINICAL DATA:  Altered mental status. Possible pulmonary infection. EXAM: PORTABLE CHEST 1 VIEW COMPARISON:  February 14, 2019. FINDINGS: Stable mild cardiomegaly. Central pulmonary vascular congestion without overt pulmonary edema or obvious pleural effusion. Low lung volumes. Linear atelectasis in the left lower lung zone. No pneumonia. Moderate skeletal degenerative change and telemetry leads. IMPRESSION: Stable mild cardiomegaly with central pulmonary vascular congestion. No pneumonia or pulmonary edema. Electronically Signed   By: Revonda Humphrey   On: 03/09/2019 21:41   DG Chest Port 1 View  Result Date: 02/14/2019 CLINICAL DATA:  Sepsis EXAM: PORTABLE CHEST 1 VIEW COMPARISON:  02/10/2018 FINDINGS: Heart and mediastinal  contours are within normal limits. No focal opacities or effusions. No acute bony abnormality. IMPRESSION: No active disease. Electronically Signed   By: Rolm Baptise M.D.   On: 02/14/2019 18:29   Overnight EEG with video  Result Date: 03/10/2019 Lora Havens, MD     03/10/2019 10:59 AM Patient Name: [name] MRN: [id number] Epilepsy Attending: Lora Havens Referring Physician/Provider: Dr Roland Rack Duration: 03/09/2019 2308 to 03/10/2019 1050 Patient history: 79 year old female presenting with polymyoclonus, fever of unclear etiology and altered mental status. EEG to evaluate for seizure. Level of alertness: awake/lethargic AEDs during EEG study: None Technical aspects: This  EEG study was done with scalp electrodes positioned according to the 10-20 International system of electrode placement. Electrical activity was acquired at a sampling rate of 500Hz  and reviewed with a high frequency filter of 70Hz  and a low frequency filter of 1Hz . EEG data were recorded continuously and digitally stored. DESCRIPTION: No clear posterior dominant rhythm was seen. EEG showed continuous generalized and maximal right frontocentral region. Sharp waves were also seen in right frontocentral region. Hyperventilation and photic stimulation were not performed. Event button was pressed on 03/10/2019 at 1016 for unclear reason. Cocomitant eeg before, during and after the event didn't show any eeg change to suggest seizure. 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 No seizures were seen throughout the recording. Event button was pressed on 03/10/2019 at 1016 for unclear reason. Cocomitant eeg before, during and after the event didn't show any eeg change to suggest seizure. Priyanka O Yadav   VAS [place] LOWER EXTREMITY VENOUS  (DVT)  Result Date: 02/22/2019  Lower Venous DVTStudy Indications: Hx of DVT.  Limitations: Body habitus and sunlight in the room, uncooperative patient. Performing Technologist: Antonieta Pert RDMS, RVT  Examination Guidelines: A complete evaluation includes B-mode imaging, spectral Doppler, color Doppler, and power Doppler as needed of all accessible portions of each vessel. Bilateral testing is considered an integral part of a complete examination. Limited examinations for reoccurring indications may be performed as noted. The reflux portion of the exam is performed with the patient in reverse Trendelenburg.  +---------+---------------+---------+-----------+----------+-------------------+ RIGHT    CompressibilityPhasicitySpontaneityPropertiesThrombus Aging      +---------+---------------+---------+-----------+----------+-------------------+ CFV      Full           Yes      Yes                                      +---------+---------------+---------+-----------+----------+-------------------+ SFJ      Full                                                             +---------+---------------+---------+-----------+----------+-------------------+ FV Prox  Full                                                             +---------+---------------+---------+-----------+----------+-------------------+ FV Mid   Full                                                             +---------+---------------+---------+-----------+----------+-------------------+ FV DistalFull                                                             +---------+---------------+---------+-----------+----------+-------------------+  PFV      Full                                                             +---------+---------------+---------+-----------+----------+-------------------+ POP                                                   Not visualized       +---------+---------------+---------+-----------+----------+-------------------+ PTV                                                   poor visualization                                                        due to pt                                                                 cooperation and                                                           tolerance.          +---------+---------------+---------+-----------+----------+-------------------+ PERO                                                  poor visualization                                                        due to pt                                                                 cooperation and  tolerance.          +---------+---------------+---------+-----------+----------+-------------------+ GSV      Full                                                             +---------+---------------+---------+-----------+----------+-------------------+   +---------+---------------+---------+-----------+----------+-------------------+ LEFT     CompressibilityPhasicitySpontaneityPropertiesThrombus Aging      +---------+---------------+---------+-----------+----------+-------------------+ CFV      Full           Yes      Yes                                      +---------+---------------+---------+-----------+----------+-------------------+ SFJ      Full                                                             +---------+---------------+---------+-----------+----------+-------------------+ FV Prox  Full                                                             +---------+---------------+---------+-----------+----------+-------------------+ FV Mid   Full                                                             +---------+---------------+---------+-----------+----------+-------------------+ FV  Distal                                             Not visualized      +---------+---------------+---------+-----------+----------+-------------------+ PFV      Full                                                             +---------+---------------+---------+-----------+----------+-------------------+ POP                     Yes      Yes                  unable to compress                                                        due to pt  cooperation and                                                           tolerance.          +---------+---------------+---------+-----------+----------+-------------------+ PTV                                                   not well visualized +---------+---------------+---------+-----------+----------+-------------------+ PERO                                                  not well visualized +---------+---------------+---------+-----------+----------+-------------------+ GSV      Full                                                             +---------+---------------+---------+-----------+----------+-------------------+     Summary: RIGHT: - There is no evidence of deep vein thrombosis in the lower extremity. However, portions of this examination were limited- see technologist comments above.  - A cystic structure is found in the popliteal fossa.  LEFT: - There is no evidence of deep vein thrombosis in the lower extremity. However, portions of this examination were limited- see technologist comments above.  - No cystic structure found in the popliteal fossa.  *See table(s) above for measurements and observations. Electronically signed by Deitra Mayo MD on 02/22/2019 at 4:12:36 PM.    Final    VAS [place] LOWER EXTREMITY VENOUS (DVT)  Result Date: 02/17/2019  Lower Venous DVTStudy Indications: Elevated ddimer.  Limitations: Body habitus,  poor ultrasound/tissue interface and pain tolerance. Comparison Study: 02/10/18 previous Performing Technologist: Abram Sander RVS  Examination Guidelines: A complete evaluation includes B-mode imaging, spectral Doppler, color Doppler, and power Doppler as needed of all accessible portions of each vessel. Bilateral testing is considered an integral part of a complete examination. Limited examinations for reoccurring indications may be performed as noted. The reflux portion of the exam is performed with the patient in reverse Trendelenburg.  +---------+---------------+---------+-----------+----------+--------------+ RIGHT    CompressibilityPhasicitySpontaneityPropertiesThrombus Aging +---------+---------------+---------+-----------+----------+--------------+ CFV      Full           Yes      Yes                                 +---------+---------------+---------+-----------+----------+--------------+ SFJ      Full                                                        +---------+---------------+---------+-----------+----------+--------------+ FV Prox  Full                                                        +---------+---------------+---------+-----------+----------+--------------+  FV Mid   Full                                                        +---------+---------------+---------+-----------+----------+--------------+ FV Distal               Yes      Yes                                 +---------+---------------+---------+-----------+----------+--------------+ PFV      Full                                                        +---------+---------------+---------+-----------+----------+--------------+ POP      Full           Yes      Yes                                 +---------+---------------+---------+-----------+----------+--------------+ PTV      Full                                                         +---------+---------------+---------+-----------+----------+--------------+ PERO                                                  Not visualized +---------+---------------+---------+-----------+----------+--------------+   +---------+---------------+---------+-----------+----------+--------------+ LEFT     CompressibilityPhasicitySpontaneityPropertiesThrombus Aging +---------+---------------+---------+-----------+----------+--------------+ CFV      Full           Yes      Yes                                 +---------+---------------+---------+-----------+----------+--------------+ SFJ      Full                                                        +---------+---------------+---------+-----------+----------+--------------+ FV Prox  Full                                                        +---------+---------------+---------+-----------+----------+--------------+ FV Mid   Full                                                        +---------+---------------+---------+-----------+----------+--------------+  FV Distal               Yes      Yes                                 +---------+---------------+---------+-----------+----------+--------------+ PFV      Full                                                        +---------+---------------+---------+-----------+----------+--------------+ POP      Full           Yes      Yes                                 +---------+---------------+---------+-----------+----------+--------------+ PTV      Full                                                        +---------+---------------+---------+-----------+----------+--------------+ PERO                                                  Not visualized +---------+---------------+---------+-----------+----------+--------------+     Summary: BILATERAL: - No evidence of deep vein thrombosis seen in the lower extremities, bilaterally.   *See table(s)  above for measurements and observations. Electronically signed by Harold Barban MD on 02/17/2019 at 7:28:13 AM.    Final

## 2019-03-10 NOTE — Progress Notes (Signed)
vLTM started in ER 1211am 03-10-19  Neuroology notified

## 2019-03-10 NOTE — Progress Notes (Signed)
Pharmacy Antibiotic Note  [name] is a 79 y.o. female admitted on 03/09/2019 with r/o meningitis.  Pharmacy has been consulted for Vancomycin dosing. Pt also on meropenem and acyclovir. Based pm CrCl will adjust antibiotic doses.  Noted SCr up to 3 (was 1.68 Nov 2020). Pt with CKD stage 3.  Plan: Vancomycin 1500mg  IV x 1 (given 3/4 at 0200) Check random level with AM labs 3/5. Will f/u renal function for further dosing  Adjust Acyclovir to 800mg  IV q24h. Adjust Merrem to 1gm IV q12h  Height: 5\' 4"  (162.6 cm) Weight: 178 lb 5.6 oz (80.9 kg) IBW/kg (Calculated) : 54.7  Temp (24hrs), Avg:101.2 F (38.4 C), Min:100.2 F (37.9 C), Max:102.9 F (39.4 C)  Recent Labs  Lab 03/09/19 1944 03/10/19 0923  WBC 8.2  --   CREATININE 3.00* 2.78*  LATICACIDVEN 1.6  --     Estimated Creatinine Clearance: 16.6 mL/min (A) (by C-G formula based on SCr of 2.78 mg/dL (H)).    Allergies  Allergen Reactions  . Methotrexate Derivatives Other (See Comments)    Elevated creat. Levels  . Actemra [Tocilizumab] Rash  . Other Other (See Comments)    NO Blood products.  [name]- causes low blood sugar (pt states she was taking medication but cant remember what it was for.)  . Penicillins Rash    Has patient had a PCN reaction causing immediate rash, facial/tongue/throat swelling, SOB or lightheadedness with hypotension: Yes Has patient had a PCN reaction causing severe rash involving mucus membranes or skin necrosis: Yes  Has patient had a PCN reaction that required hospitalization No Has patient had a PCN reaction occurring within the last 10 years: No If all of the above answers are "NO", then may proceed with Cephalosporin use.   . Chlorhexidine   . Remicade [Infliximab] Other (See Comments)    Inadequate response   . Zocor [Simvastatin] Other (See Comments)    Leg cramps     Antimicrobials this admission: 3/4 Vanc >>  3/4 Meropenem >>  3/4 Acyclovir>>  Microbiology  results: 3/3 BCx:  3/3 UCx:   Cdiff PCR:   Thank you for allowing pharmacy to be a part of this patient's care.  [employer], Pharm.D., BCPS Clinical Pharmacist Clinical phone for 03/10/2019 from 8:30-4:00 is [phone].  **Pharmacist phone directory can be found on [email or web address] listed under [place].  03/10/2019 12:44 PM

## 2019-03-10 NOTE — Progress Notes (Signed)
Spoke with RN Mitzi Hansen about COVID testing and ultimate bed destination for patient. Since patient is within 90 days of positive COVID test, new test did not need ordered but this was done prior to admission. POC test negative and patient outside of 21 day window since positive test, however, she is still febrile with intermittent tachypnea and with unknown source. Would prefer patient be on COVID unit at this time. Spoke with RN Mitzi Hansen who is caring for patient.  Barrington Ellison, MD

## 2019-03-10 NOTE — H&P (Addendum)
[facility] History and Physical  ELMINA HENDEL LOV:[id number] DOB: Jan 23, 1940 DOA: 03/09/2019  Referring physician: ED Provider - Langston Masker, MD PCP: Jonathon Jordan, MD   Chief Complaint: Fever and Acute Encephalopathy  HPI: [name] is a 79 y.o. 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  Hx provided completely by daughter who is at bedside as patient is lethargic and unable to respond to questions. Patient arrived by EMS. She lives with her daughter. At baseline, daughter reports she is ambulatory and completely coherent. She was recently hospitalized in Feb for COVID infection and discharged 2/21. Daughter reports that initially everything was going well at home. One week ago patient complained of diarrhea which persisted and she eventually had a fever last Saturday. She took Pedialyte, Imodium, Gatorade and Tylenol. Highest fever has been 103F and this has been persistent when not taking Tylenol since last Wednesday. Patient also became progressively weaker but daughter noticed a difference this afternoon. The patient woke up this morning and ate breakfast and was conversational. She then took a nap and when she woke up her daughter reports she did not know her daughter's name and was very lethargic. She then noted jerking movement of her legs and an odd chewing movement of her mouth. No known seizure. Denies problems eating or drinking prior to this afternoon. She did reports some abdominal pain and nausea associated with diarrhea. Denies her mother with other complaints such as: cough, SOB, chest pain, vomiting, constipation, dysuria, hematuria, hematochezia, melena or any other complaints.  In the ED: Patient febrile with tachycardia and mild  tachypnea. O2 sats and BP stable. UA without evidence of infection. BMP WNL for patient. WBC 8.2, Hgb 7.8. TSH and Lactate WNL. Trop 35>60. Blood cx drawn. Patient was started on Vanc/Merrem/Acyclovir and given 1 L fluid bolus. CT Head: Chronic atrophic and ischemic changes without acute abnormality. CXR: Stable mild cardiomegaly with central pulmonary vascular congestion. No pneumonia or pulmonary edema. ED Physician attempted LP and unsuccessful.  Neuro consulted and will cont to follow. EEG done in ED.   Review of Systems:  All other systems negative unless noted above in HPI. Obtained from daughter only.  Past Medical History:  Diagnosis Date  . Allergic rhinitis   . Anemia   . Aortic stenosis 12/22/2012   Mild 3/14-2.35 m/s peak velocity  . Cataract 2018  . CHF (congestive heart failure) ([place])   . Chronic pain   . CKD (chronic kidney disease), stage III   . Diabetes mellitus   . Diverticulosis   . DVT (deep venous thrombosis) ([place])   . Glaucoma   . Hemorrhoids    internal  . HTN (hypertension)   . Hypercholesteremia   . Hyperlipidemia 12/22/2012  . Hypothyroidism   . Meningioma ([place]) 2008  . Osteoarthritis   . Peripheral neuropathy   . PPD positive    6 months ago  . PVC (premature ventricular contraction) 12/22/2012  . Rheumatoid arthritis(714.0)   . Sickle cell trait ([place])   . 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 ABDOMINAL HYSTERECTOMY  1978   TAH,& BSO 1  . CATARACT EXTRACTION Bilateral   . EYE SURGERY    .  fistula repair  03/2010   partial colectomy  . intracranial surgery  11/2007   removal of meningioma  . JOINT REPLACEMENT  2001   left TKR  . LEFT HEART CATHETERIZATION WITH CORONARY ANGIOGRAM N/A 08/08/2013   Procedure: LEFT HEART CATHETERIZATION WITH CORONARY ANGIOGRAM;  Surgeon: Peter M Martinique, MD;  Location: [facility] CATH LAB;  Service:  Cardiovascular;  Laterality: N/A;  . ROTATOR CUFF REPAIR  1999   BIlateral  . SIGMOIDECTOMY  2012  . TOE SURGERY Left 2018  . TONSILLECTOMY AND ADENOIDECTOMY    . TOTAL THYROIDECTOMY  1992  . wrist sx     right    Social History:  reports that she quit smoking about 57 years ago. Her smoking use included cigarettes. She has a 0.20 pack-year smoking history. She has never used smokeless tobacco. She reports current alcohol use. She reports that she does not use drugs.  Allergies  Allergen Reactions  . Methotrexate Derivatives Other (See Comments)    Elevated creat. Levels  . Actemra [Tocilizumab] Rash  . Other Other (See Comments)    NO Blood products.  [name]- causes low blood sugar (pt states she was taking medication but cant remember what it was for.)  . Penicillins Rash    Has patient had a PCN reaction causing immediate rash, facial/tongue/throat swelling, SOB or lightheadedness with hypotension: Yes Has patient had a PCN reaction causing severe rash involving mucus membranes or skin necrosis: Yes  Has patient had a PCN reaction that required hospitalization No Has patient had a PCN reaction occurring within the last 10 years: No If all of the above answers are "NO", then may proceed with Cephalosporin use.   . Chlorhexidine   . Remicade [Infliximab] Other (See Comments)    Inadequate response   . Zocor [Simvastatin] Other (See Comments)    Leg cramps     Family History  Problem Relation Age of Onset  . Hypotension Mother   . CVA Mother   . Diabetes Mother   . Hypertension Father   . Kidney failure Father     Prior to Admission medications   Medication Sig Start Date End Date Taking? Authorizing Provider  abatacept 750 mg in sodium chloride 0.9 % 70 mL Inject 750 mg into the vein every 28 (twenty-eight) days. 09/21/18   Bo Merino, MD  acetaminophen (TYLENOL) 500 MG tablet Take 1,000 mg by mouth every 6 (six) hours as needed for mild pain.    [provider]  allopurinol (ZYLOPRIM) 300 MG tablet Take 1 tablet (300 mg total) by mouth daily. 03/31/17 05/06/19  Bo Merino, MD  aspirin EC 81 MG EC tablet Take 1 tablet (81 mg total) by mouth daily. 10/22/15   Dessa Phi, DO  Aspirin-Caffeine (BACK & BODY EXTRA STRENGTH) 500-32.5 MG TABS Take 1 tablet by mouth daily as needed (pain).    [provider]  atorvastatin (LIPITOR) 80 MG tablet Take 80 mg by mouth daily.  06/22/16   [provider]  BD INSULIN SYRINGE ULTRAFINE 31G X 15/64" 0.3 ML MISC  11/02/15   [provider]  Calcium Carb-Cholecalciferol (CALCIUM 600-D PO) Take 1 tablet by mouth daily.    [provider]  Cholecalciferol (VITAMIN D3) 10 MCG (400 UNIT) tablet Take 400 Units by mouth daily.    [provider]  clotrimazole-betamethasone (LOTRISONE) cream Apply 1 application topically daily as needed (rash/irritation).  05/29/16   [provider]  cyclobenzaprine (FLEXERIL) 10 MG tablet Take 10 mg by  mouth 3 (three) times daily as needed for muscle spasms.     [provider]  ferrous sulfate (FER-IN-SOL) 75 (15 Fe) MG/ML SOLN Take 45 mg of iron by mouth daily.    [provider]  HYDROcodone-acetaminophen (NORCO) 5-325 MG tablet Take 1 tablet by mouth every 6 (six) hours as needed for moderate pain. Patient taking differently: Take 1-2 tablets by mouth every 6 (six) hours as needed for moderate pain.  07/22/16   Landis Martins, DPM  Insulin Isophane & Regular Human (NOVOLIN 70/30 FLEXPEN RELION) (70-30) 100 UNIT/ML PEN Inject 6-8 Units into the skin 3 (three) times daily as needed (CBG >140).    [provider]  leflunomide (ARAVA) 10 MG tablet Take 1 tablet (10 mg total) by mouth daily. 12/16/18   Bo Merino, MD  levothyroxine (SYNTHROID) 150 MCG tablet Take 1 tablet (150 mcg total) by mouth daily before breakfast. 02/28/19   Caren Griffins, MD  pregabalin (LYRICA) 75 MG capsule 1  bid Patient taking differently: Take 75 mg by mouth 2 (two) times daily.  09/30/16   Bo Merino, MD  vitamin B-12 (CYANOCOBALAMIN) 50 MCG tablet Take 50 mcg by mouth daily.    [provider]   Physical Exam: Vitals:   03/09/19 2045 03/09/19 2100 03/09/19 2115 03/09/19 2129  BP:  129/67 (!) 125/58   Pulse: 87 87 85   Resp: 18 (!) 22 (!) 21   Temp:      TempSrc:      SpO2: 95% 96% 94%   Weight:    88 kg  Height:    5\' 4"  (1.626 m)    Wt Readings from Last 3 Encounters:  03/09/19 88 kg  02/27/19 88 kg  01/12/19 82.1 kg    General:  Resting in bed with eyes closed. Some movement when spoken to but overall minimal responses. Unable to assess orientation. Confused per daughter's report. Eyes: Unable to evaluate. ENT: grossly lips & tongue Neck: normal ROM Cardiovascular: RRR, no m/r/g. Trace LE edema. Respiratory: CTA bilaterally, no w/r/r. Normal respiratory effort. Abdomen: soft, ntnd Skin: no rash or induration seen on limited exam Musculoskeletal: grossly normal tone BUE/BLE Psychiatric: unable to assess  Neurologic: grossly non-focal; moving all extremities            Labs on Admission:  Basic Metabolic Panel: Recent Labs  Lab 03/09/19 1944  NA 137  K 4.8  CL 108  CO2 18*  GLUCOSE 143*  BUN 19  CREATININE 3.00*  CALCIUM 7.8*   Liver Function Tests: No results for input(s): AST, ALT, ALKPHOS, BILITOT, PROT, ALBUMIN in the last 168 hours. No results for input(s): LIPASE, AMYLASE in the last 168 hours. No results for input(s): AMMONIA in the last 168 hours. CBC: Recent Labs  Lab 03/09/19 1944  WBC 8.2  NEUTROABS 5.5  HGB 7.8*  HCT 25.2*  MCV 94.4  PLT 210   Cardiac Enzymes: No results for input(s): CKTOTAL, CKMB, CKMBINDEX, TROPONINI in the last 168 hours.  BNP (last 3 results) Recent Labs    02/14/19 2023  BNP 73.0    ProBNP (last 3 results) No results for input(s): PROBNP in the last 8760 hours.  CBG: No results for  input(s): GLUCAP in the last 168 hours.  Radiological Exams on Admission: CT Head Wo Contrast  Result Date: 03/09/2019 CLINICAL DATA:  Altered mental status and increasing confusion, history of COVID-19 positivity EXAM: CT HEAD WITHOUT CONTRAST TECHNIQUE: Contiguous axial images were obtained from the base  of the skull through the vertex without intravenous contrast. COMPARISON:  02/10/2018 FINDINGS: Brain: Mild atrophic changes and chronic white matter ischemic changes are seen stable from the prior exam. No findings to suggest acute hemorrhage, acute infarction or space-occupying mass lesion are noted. Vascular: No hyperdense vessel or unexpected calcification. Skull: Changes consistent with prior craniotomy are noted in right temporoparietal region Sinuses/Orbits: No acute finding. Other: None. IMPRESSION: Chronic atrophic and ischemic changes without acute abnormality. Electronically Signed   By: Inez Catalina M.D.   On: 03/09/2019 21:10   DG Chest Portable 1 View  Result Date: 03/09/2019 CLINICAL DATA:  Altered mental status. Possible pulmonary infection. EXAM: PORTABLE CHEST 1 VIEW COMPARISON:  February 14, 2019. FINDINGS: Stable mild cardiomegaly. Central pulmonary vascular congestion without overt pulmonary edema or obvious pleural effusion. Low lung volumes. Linear atelectasis in the left lower lung zone. No pneumonia. Moderate skeletal degenerative change and telemetry leads. IMPRESSION: Stable mild cardiomegaly with central pulmonary vascular congestion. No pneumonia or pulmonary edema. Electronically Signed   By: Revonda Humphrey   On: 03/09/2019 21:41    EKG: Independently reviewed. HR 90. QTc 484. Sinus rhythm. LBBB as seen on prior EKG. No STEMI.  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 female with PMH hypothyroidism, CKD, rheumatoid arthritis, DM chronic anemia, chronic pain and aortic stenosis who presents to ED for AMS and fever and admitted for further workup.  Acute Encephalopathy in setting of Fever/Diarrhea - unclear etiology at this time: Meningitis, seizure, infection among others - Cont Vanc/Merrem/Acyclovir - F/u Blood cx - LP in ED unsuccessful  - Neuro consulted in ED and seen by Dr. Leonel Ramsay; EEG done. Will cont to follow. - CT Head non-acute - Due to history of diarrhea, C Diff ordered (was initially on Ceftriaxone and Flagyl during last admission which could pre-dispose to C Diff) - Less likely COVID due to stable respiratory status but could be unknown sequela - S/p 1 L IVF; Lactate WNL and WBC WNL; will hold further rehydration at this time due to known diastolic dysfunction - NPO currently due to AMS  CKD IIIb - Cr 3.00 on admission; unclear baseline; maybe around 2.6-3 - Daily BMP - Renally dose meds   COVID-19 infection  - initially positive 2/8; unlikely to still be causing symptoms but could be possible unknown sequela - will cont with COVID precautions at this time - Consider CTA if no other likely source found and patient with persistent tachypnea   Anemia of Chronic Disease  - Hgb is 7.8 on admission; at baseline   - Patient refuses blood transfusions, family aware of this decision and supportive of her; family and patient aware that this is life-threatening situation that can be corrected with RBC transfusion  - Minimize blood draws    Insulin-dependent DM   - A1c 6.6 last month  - Continue CBG checks and SSI  LBBB, elevated troponin -  LBBB present during last admission as well - Tele - Trend Trop  Hypothyroidism  -  TSH normal on admission - IV Synthroid ordered as patient likely unable to take PO at this time   Rheumatoid arthritis; chronic pain  - Sedating medications held on admission   Code Status: Full Code per daughter DVT Prophylaxis:Heparin/SCDs Family Communication: Daughter at bedside Disposition Plan: Admit to Inpatient with Telemetry. Patient is acutely ill and requiring IV abx and IVF. Due to age and multiple chronic/severe co-morbidities, patient is at high risk for decompensation. Anticipate discharge in 5-6 days.   Time spent: 70 minutes  [facility] Pager [phone]

## 2019-03-10 NOTE — ED Notes (Signed)
  Attempted to call report.  RN will call me back. 

## 2019-03-10 NOTE — Progress Notes (Addendum)
PT Cancellation Note  Patient Details Name: [name] MRN: [id number] DOB: [birth year]   Cancelled Treatment:    Reason Eval/Treat Not Completed: Other (comment)(patient with another provider, now pending LE duplex)  PT eval order received, chart reviewed, PT attempt this morning and patient with another provider in room. 2nd attempt now and patient pending LE duplex for rapidly rising D dimer. PT to attempt at next availability pending results and further workup/appropriateness to participate in PT evaluation.  Birdie Hopes 03/10/2019, 12:18 PM

## 2019-03-10 NOTE — Progress Notes (Signed)
No ongoing sz on EEG. Full report to follow.   Roland Rack, MD [facility] [phone]  If 7pm- 7am, please page neurology on call as listed in [place].

## 2019-03-10 NOTE — Progress Notes (Signed)
Pharmacy Antibiotic Note  [name] is a 79 y.o. female admitted on 03/09/2019 with r/o meningitis.  Pharmacy has been consulted for Vancomycin dosing. Pt also on meropenem and acyclovir.   Noted SCr up to 3 (was 1.68 Nov 2020). Pt with CKD stage 3.  Plan: Vancomycin 1500mg  IV now as already ordered Will f/u renal function for further dosing  Height: 5\' 4"  (162.6 cm) Weight: 194 lb 0.1 oz (88 kg) IBW/kg (Calculated) : 54.7  Temp (24hrs), Avg:102.9 F (39.4 C), Min:102.9 F (39.4 C), Max:102.9 F (39.4 C)  Recent Labs  Lab 03/09/19 1944  WBC 8.2  CREATININE 3.00*  LATICACIDVEN 1.6    Estimated Creatinine Clearance: 16.1 mL/min (A) (by C-G formula based on SCr of 3 mg/dL (H)).    Allergies  Allergen Reactions  . Methotrexate Derivatives Other (See Comments)    Elevated creat. Levels  . Actemra [Tocilizumab] Rash  . Other Other (See Comments)    NO Blood products.  [name]- causes low blood sugar (pt states she was taking medication but cant remember what it was for.)  . Penicillins Rash    Has patient had a PCN reaction causing immediate rash, facial/tongue/throat swelling, SOB or lightheadedness with hypotension: Yes Has patient had a PCN reaction causing severe rash involving mucus membranes or skin necrosis: Yes  Has patient had a PCN reaction that required hospitalization No Has patient had a PCN reaction occurring within the last 10 years: No If all of the above answers are "NO", then may proceed with Cephalosporin use.   . Chlorhexidine   . Remicade [Infliximab] Other (See Comments)    Inadequate response   . Zocor [Simvastatin] Other (See Comments)    Leg cramps     Antimicrobials this admission: 3/4 Vanc >>  3/4 Meropenem >>  3/4 Acyclovir>>  Microbiology results: 3/3 BCx:  3/3 UCx:   Cdiff PCR:   Thank you for allowing pharmacy to be a part of this patient's care.  Sherlon Handing, PharmD, BCPS Please see amion for complete clinical  pharmacist phone list 03/10/2019 12:23 AM

## 2019-03-10 NOTE — Progress Notes (Signed)
Late entry  LTM maint complete - no skin breakdown under: skin check at F7 hard to check for Maint due to so much head wrap

## 2019-03-10 NOTE — Progress Notes (Signed)
Reason for consult: Polymyoclonus, encephalopathy  Subjective: Patient is somnolent, tells me her name.  Does not follow any commands.   ROS: Unable to obtain due to patient's mental status  Examination  Vital signs in last 24 hours: Temp:  [100.2 F (37.9 C)-102.9 F (39.4 C)] 101.6 F (38.7 C) (03/04 1541) Pulse Rate:  [80-114] 114 (03/04 1541) Resp:  [14-28] 28 (03/04 1541) BP: (124-155)/(53-90) 137/56 (03/04 1541) SpO2:  [94 %-99 %] 95 % (03/04 1541) Weight:  [80.9 kg-88 kg] 80.9 kg (03/04 0424)  General: lying in bed CVS: pulse-normal rate and rhythm RS: breathing comfortably Extremities: normal   Neuro: MS: Somnolent, states her name when asked, does not follow any commands, grimaces and screams out to any noxious stimulus CN: pupils equal and reactive, no forced gaze deviation Motor: Withdraws against gravity in bilateral upper extremities, withdraws minimally in bilateral lower extremities Reflexes:plantars: flexor Gait: not tested  Basic Metabolic Panel: Recent Labs  Lab 03/09/19 1944 03/10/19 0923  NA 137 139  K 4.8 6.0*  CL 108 112*  CO2 18* 13*  GLUCOSE 143* 98  BUN 19 19  CREATININE 3.00* 2.78*  CALCIUM 7.8* 7.8*  MG  --  1.4*    CBC: Recent Labs  Lab 03/09/19 1944  WBC 8.2  NEUTROABS 5.5  HGB 7.8*  HCT 25.2*  MCV 94.4  PLT 210     Coagulation Studies: Recent Labs    03/09/19 1944  LABPROT 13.9  INR 1.1    Imaging Reviewed:     ASSESSMENT AND PLAN  79 year old female with past medical history of hypertension, hyperlipidemia, meningioma, rheumatoid arthritis, recent COVID-19  Infection (fully treated outside quarantine) presents to the emergency department with fever and altered mental status with polymyoclonus on exam. Noted to have AKI, also on narcotics and Lyrica.  LP was attempted in the emergency room but unable to be obtained, will need to be performed by IR.  Patient placed on continuous EEG. Showed left frontocentral  sharp waves throughout the recording with no seizures.  No longer having polymyoclonus on exam today, remains encephalopathic.  Continues to have frequent sharp waves, slightly increased in frequency on EEG.  Toxic metabolic encephalopathy   Recommendations Continue EEG for 24 more hours Keppra 500 mg twice daily Continue to hold Lyrica/narcotics Continue management of medical conditions Continue empiric antibiotics and IV acyclovir until LP under fluoroscopy obtain. Speech therapy, n.p.o. until then   Neurology will continue to follow   [place] Pager Number [phone] For questions after 7pm please refer to AMION to reach the Neurologist on call

## 2019-03-11 ENCOUNTER — Inpatient Hospital Stay (HOSPITAL_COMMUNITY): Payer: Medicare Other

## 2019-03-11 LAB — COMPREHENSIVE METABOLIC PANEL
ALT: 12 U/L (ref 0–44)
AST: 16 U/L (ref 15–41)
Albumin: 1.6 g/dL — ABNORMAL LOW (ref 3.5–5.0)
Alkaline Phosphatase: 105 U/L (ref 38–126)
Anion gap: 13 (ref 5–15)
BUN: 23 mg/dL (ref 8–23)
CO2: 18 mmol/L — ABNORMAL LOW (ref 22–32)
Calcium: 8.1 mg/dL — ABNORMAL LOW (ref 8.9–10.3)
Chloride: 112 mmol/L — ABNORMAL HIGH (ref 98–111)
Creatinine, Ser: 2.94 mg/dL — ABNORMAL HIGH (ref 0.44–1.00)
GFR calc Af Amer: 17 mL/min — ABNORMAL LOW (ref >60)
GFR calc non Af Amer: 14 mL/min — ABNORMAL LOW (ref >60)
Glucose, Bld: 161 mg/dL — ABNORMAL HIGH (ref 70–99)
Potassium: 4.1 mmol/L (ref 3.5–5.1)
Sodium: 143 mmol/L (ref 135–145)
Total Bilirubin: 0.7 mg/dL (ref 0.3–1.2)
Total Protein: 5.3 g/dL — ABNORMAL LOW (ref 6.5–8.1)

## 2019-03-11 LAB — CBC WITH DIFFERENTIAL/PLATELET
Abs Immature Granulocytes: 0.22 10*3/uL — ABNORMAL HIGH (ref 0.00–0.07)
Basophils Absolute: 0.1 10*3/uL (ref 0.0–0.1)
Basophils Relative: 1 %
Eosinophils Absolute: 0.2 10*3/uL (ref 0.0–0.5)
Eosinophils Relative: 1 %
HCT: 26.7 % — ABNORMAL LOW (ref 36.0–46.0)
Hemoglobin: 8.6 g/dL — ABNORMAL LOW (ref 12.0–15.0)
Immature Granulocytes: 2 %
Lymphocytes Relative: 10 %
Lymphs Abs: 1.3 10*3/uL (ref 0.7–4.0)
MCH: 29.1 pg (ref 26.0–34.0)
MCHC: 32.2 g/dL (ref 30.0–36.0)
MCV: 90.2 fL (ref 80.0–100.0)
Monocytes Absolute: 0.4 10*3/uL (ref 0.1–1.0)
Monocytes Relative: 3 %
Neutro Abs: 11.4 10*3/uL — ABNORMAL HIGH (ref 1.7–7.7)
Neutrophils Relative %: 83 %
Platelets: 288 10*3/uL (ref 150–400)
RBC: 2.96 MIL/uL — ABNORMAL LOW (ref 3.87–5.11)
RDW: 18.2 % — ABNORMAL HIGH (ref 11.5–15.5)
WBC Morphology: INCREASED
WBC: 13.6 10*3/uL — ABNORMAL HIGH (ref 4.0–10.5)
nRBC: 0 % (ref 0.0–0.2)

## 2019-03-11 LAB — CSF CELL COUNT WITH DIFFERENTIAL
RBC Count, CSF: 92 /mm3 — ABNORMAL HIGH
Tube #: 3
WBC, CSF: 1 /mm3 (ref 0–5)

## 2019-03-11 LAB — D-DIMER, QUANTITATIVE: D-Dimer, Quant: 14.23 ug/mL-FEU — ABNORMAL HIGH (ref 0.00–0.50)

## 2019-03-11 LAB — PROTEIN, CSF: Total  Protein, CSF: 54 mg/dL — ABNORMAL HIGH (ref 15–45)

## 2019-03-11 LAB — BRAIN NATRIURETIC PEPTIDE: B Natriuretic Peptide: 347.6 pg/mL — ABNORMAL HIGH (ref 0.0–100.0)

## 2019-03-11 LAB — GLUCOSE, CAPILLARY
Glucose-Capillary: 101 mg/dL — ABNORMAL HIGH (ref 70–99)
Glucose-Capillary: 147 mg/dL — ABNORMAL HIGH (ref 70–99)
Glucose-Capillary: 130 mg/dL — ABNORMAL HIGH (ref 70–99)
Glucose-Capillary: 136 mg/dL — ABNORMAL HIGH (ref 70–99)

## 2019-03-11 LAB — MAGNESIUM: Magnesium: 2 mg/dL (ref 1.7–2.4)

## 2019-03-11 LAB — GLUCOSE, CSF: Glucose, CSF: 85 mg/dL — ABNORMAL HIGH (ref 40–70)

## 2019-03-11 LAB — C-REACTIVE PROTEIN: CRP: 30.1 mg/dL — ABNORMAL HIGH

## 2019-03-11 LAB — VANCOMYCIN, RANDOM: Vancomycin Rm: 15

## 2019-03-11 MED ORDER — LACTATED RINGERS IV SOLN: INTRAVENOUS | Status: DC

## 2019-03-11 MED ORDER — SODIUM CHLORIDE 0.9 % IV SOLN
1.0000 g | Freq: Two times a day (BID) | INTRAVENOUS | Status: DC
  Administered 2019-03-11 – 2019-03-12 (×2): 1 g via INTRAVENOUS
  Filled 2019-03-11 (×2): qty 1

## 2019-03-11 MED ORDER — VANCOMYCIN HCL IN DEXTROSE 1-5 GM/200ML-% IV SOLN
1000.0000 mg | INTRAVENOUS | Status: DC
  Administered 2019-03-11: 1000 mg via INTRAVENOUS
  Filled 2019-03-11 (×2): qty 200

## 2019-03-11 MED ORDER — LIDOCAINE HCL (PF) 1 % IJ SOLN
5.0000 mL | Freq: Once | INTRAMUSCULAR | Status: AC
  Administered 2019-03-11: 5 mL via INTRADERMAL
  Filled 2019-03-11: qty 5

## 2019-03-11 MED ORDER — FLUCONAZOLE 100MG IVPB
100.0000 mg | Freq: Once | INTRAVENOUS | Status: AC
  Administered 2019-03-11: 100 mg via INTRAVENOUS
  Filled 2019-03-11 (×2): qty 50

## 2019-03-11 NOTE — Progress Notes (Signed)
PT Cancellation Note  Patient Details Name: ARDYTHE KLUTE MRN: [id number] DOB: 18-Oct-1940   Cancelled Treatment:    Reason Eval/Treat Not Completed: Medical issues which prohibited therapy;Other (comment);Patient at procedure or test/unavailable(per chart review and secure message with MD)  Patient on continuous EEG and also scheduled for LP at 2pm today. Per secure chat with MD, hold PT evaluation until 03/12/19.   Birdie Hopes 03/11/2019, 9:26 AM

## 2019-03-11 NOTE — Procedures (Addendum)
Patient Name: [name]  MRN: [id number]  Epilepsy Attending: Lora Havens  Referring Physician/Provider: Dr Roland Rack Duration: 03/10/2019 2308 to 03/11/2019 0942  Patient history: 79 year old female presenting with polymyoclonus, fever of unclear etiology and altered mental status. EEG to evaluate for seizure.  Level of alertness: awake/lethargic  AEDs during EEG study: None  Technical aspects: This EEG study was done with scalp electrodes positioned according to the 10-20 International system of electrode placement. Electrical activity was acquired at a sampling rate of 500Hz  and reviewed with a high frequency filter of 70Hz  and a low frequency filter of 1Hz . EEG data were recorded continuously and digitally stored.   DESCRIPTION: No clear posterior dominant rhythm was seen. EEG showed continuous generalized and maximal right frontocentral region. Sharp waves were also seen in right frontocentral region. Hyperventilation and photic stimulation were not performed.  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 No seizures were seen throughout the recording.  [name] [name]

## 2019-03-11 NOTE — Progress Notes (Addendum)
Reason for consult:   Subjective: Patient is more awake and alert today.  Not having any myoclonic movements.  Underwent MRI brain and LP.   ROS: negative except above  Examination  Vital signs in last 24 hours: Temp:  [97.6 F (36.4 C)] 97.6 F (36.4 C) (03/04 2000) Pulse Rate:  [107] 107 (03/05 0800) Resp:  [19-21] 21 (03/05 0800) BP: (121-146)/(58-59) 121/59 (03/05 0800) SpO2:  [94 %-96 %] 96 % (03/05 0800)  General: lying in bed CVS: pulse-normal rate and rhythm RS: breathing comfortably Extremities: normal   Neuro: MS: Alert, oriented to herself only, follows commands CN: pupils equal and reactive,  EOMI, face symmetric, tongue midline, normal sensation over face, Motor: Moves all extremities with at least 4/5 strength Gait: not tested  Basic Metabolic Panel: Recent Labs  Lab 03/09/19 1944 03/10/19 0923 03/10/19 1846 03/11/19 0747  NA 137 139  --  143  K 4.8 6.0* 4.7 4.1  CL 108 112*  --  112*  CO2 18* 13*  --  18*  GLUCOSE 143* 98  --  161*  BUN 19 19  --  23  CREATININE 3.00* 2.78*  --  2.94*  CALCIUM 7.8* 7.8*  --  8.1*  MG  --  1.4*  --  2.0    CBC: Recent Labs  Lab 03/09/19 1944 03/11/19 0747  WBC 8.2 13.6*  NEUTROABS 5.5 11.4*  HGB 7.8* 8.6*  HCT 25.2* 26.7*  MCV 94.4 90.2  PLT 210 288     Coagulation Studies: Recent Labs    03/09/19 1944  LABPROT 13.9  INR 1.1    Imaging Reviewed:     ASSESSMENT AND PLAN  -Reviewed MRI brain: No acute findings -Continues EEG showed no seizures overnight, right frontocentral sharp waves, started on Keppra 500 twice daily.  Will discontinue EEG today.    Addendum LP negative for infection, cell count 1. Will d/c Acyclovir  Continue remaining antibiotics.  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   Karena Addison Maylynn Orzechowski [facility] Pager Number [phone] For questions after 7pm please refer to AMION to reach the Neurologist on call

## 2019-03-11 NOTE — Progress Notes (Signed)
OT Cancellation Note  Patient Details Name: [name] MRN: [id number] DOB: 08-26-1940   Cancelled Treatment:    Reason Eval/Treat Not Completed: Medical issues which prohibited therapy;Other (comment)(Pt on continuous EEG and LP scheduled at [place] therapy until 3/6)  Layla Maw 03/11/2019, 9:27 AM

## 2019-03-11 NOTE — Progress Notes (Signed)
PROGRESS NOTE                                                                                                                                                                                                             Patient Demographics:    [name], is a 79 y.o. female, DOB - 1940-10-20, KZL:[id number]  Outpatient Primary MD for the patient is Jonathon Jordan, MD    LOS - 1  Admit date - 03/09/2019    Chief Complaint  Patient presents with  . Altered Mental Status       Brief Narrative   [name] is a 79 y.o. female with PMH hypothyroidism, hx of craniectomy(for frontal temporary brain meningioma resection), CKD 4, rheumatoid arthritis, DM chronic anemia, chronic pain, recent COVID infection for which she was treated to [facility] and discharged few weeks ago, aortic stenosis.  Who lives at home and lately was having some diarrhea and generalized weakness, had multiple falls at home around 3-4 and thereafter became increasingly confused.  Was brought to the ER where she was found to have low-grade fever, had toxic and metabolic encephalopathy, dehydration, neurology was consulted.  She was started on empiric treatment for meningitis and admitted to the hospital.   Subjective:   Patient in bed appears to be in no distress, she is slightly more awake today, able to answer basic questions and follow basic commands but overall still not close to her baseline.   Assessment  & Plan :    1.  Toxic and metabolic encephalopathy.  Multiple falls at home, reported headache prior to admission.  Low-grade fever.  Seen by neurology, head CT unremarkable, suspicion for meningitis.  LP in the ER failed, IR requested to do LP.  Appropriate tests ordered.  Currently on empiric broad-spectrum antibiotics along with IV acyclovir. EEG does show evidence of epileptogenicity and cortical dysfunction in right frontocentral region.  Placed on Keppra by Neuro ( prelim EEG was -ve) .  She is awaiting MRI brain along with LP, continue Keppra along with antibiotics and IV acyclovir, clinically somewhat improved on 03/11/2019 but still nowhere close to her baseline.  Will have PT OT and speech see her.  Currently n.p.o. with gentle fluids.  2.  Recent COVID-19 pneumonia.  Fully treated.  She is out of quarantine period.   SpO2: 96 %  Recent Labs  Lab 03/10/19 0923 03/11/19 0747  CRP 25.7* 30.1*  DDIMER 11.89* 14.23*  BNP 1,628.4* 347.6*    Hepatic Function Latest Ref Rng & Units 03/11/2019 02/27/2019 02/26/2019  Total Protein 6.5 - 8.1 g/dL 5.3(L) - -  Albumin 3.5 - 5.0 g/dL 1.6(L) 2.8(L) 2.5(L)  AST 15 - 41 U/L 16 - -  ALT 0 - 44 U/L 12 - -  Alk Phosphatase 38 - 126 U/L 105 - -  Total Bilirubin 0.3 - 1.2 mg/dL 0.7 - -    3.  CKD 4.  Baseline creatinine around 2.6.  Gently hydrate, Foley as she has urinary retention.  Outpatient nephrology follow-up.  4.  Urinary retention.  Flomax and Foley.  5.  Hypothyroidism.  Check TSH and cortisol, IV Synthroid.  Lab Results  Component Value Date   TSH 0.857 03/10/2019    6.  History of meningioma with craniotomy in the past.  Supportive care.  Head CT nonacute.  7.  Dyslipidemia.  Once taking oral medications consistently will start home dose statin.  8.  History of moderate to severe AS.  Supportive care.  No acute issues.  Will require outpatient cardiology follow-up.  Could be a TAVR candidate.  9.  Elevated D-dimer with history of DVT.  Will check lower extremity venous duplex.  10.  Patient and family do not take blood transfusions.  11. Hyperkalemia and Hypomagnesemia - treated.     Condition - Extremely Guarded  Family Communication  : Daughter updated in detail on 03/10/2019, updated again on 03/11/2019  Code Status : No intubation  Diet :    Diet Order            Diet NPO time specified  Diet effective now               Disposition Plan  :  Patient in PCU getting worked up for encephalopathy, possible meningitis.  Needs MRI, LP, IV antibiotics.  Consults  : Neurology  Procedures  :    CT head.  Nonacute.  EEG.  Final - This study showed evidence of epileptogenicity and cortical dysfunction in right frontocentral region. No seizures were seen throughout the recording.  MRI brain  Lower extremity venous duplex.  No DVT.  LP requested by IR  PUD Prophylaxis : none  DVT Prophylaxis  :    Heparin   Lab Results  Component Value Date   PLT 288 03/11/2019    Inpatient Medications  Scheduled Meds: . heparin  5,000 Units Subcutaneous Q8H  . insulin aspart  0-9 Units Subcutaneous TID WC  . levETIRAcetam  500 mg Oral BID  . levothyroxine  75 mcg Intravenous Daily  . tamsulosin  0.4 mg Oral Daily   Continuous Infusions: . acyclovir 800 mg (03/11/19 0543)  . fluconazole (DIFLUCAN) IV    . meropenem (MERREM) IV 1 g (03/11/19 0357)  . vancomycin     PRN Meds:.acetaminophen, albuterol  Antibiotics  :    Anti-infectives (From admission, onward)   Start     Dose/Rate Route Frequency Ordered Stop   03/11/19 1130  vancomycin (VANCOCIN) IVPB 1000 mg/200 mL premix     1,000 mg 200 mL/hr over 60 Minutes Intravenous Every 48 hours 03/11/19 1032     03/11/19 1130  fluconazole (DIFLUCAN) IVPB 100 mg     100 mg 50 mL/hr over 60 Minutes Intravenous  Once 03/11/19 1035     03/11/19 0600  acyclovir (ZOVIRAX) 800 mg in dextrose 5 % 150  mL IVPB     800 mg 166 mL/hr over 60 Minutes Intravenous Every 24 hours 03/10/19 1234     03/10/19 1600  meropenem (MERREM) 1 g in sodium chloride 0.9 % 100 mL IVPB     1 g 200 mL/hr over 30 Minutes Intravenous Every 12 hours 03/10/19 1247     03/10/19 0032  vancomycin variable dose per unstable renal function (pharmacist dosing)  Status:  Discontinued      Does not apply See admin instructions 03/10/19 0032 03/11/19 1032   03/09/19 2330  acyclovir (ZOVIRAX) 800 mg in dextrose 5 % 150 mL IVPB   Status:  Discontinued     800 mg 166 mL/hr over 60 Minutes Intravenous Every 8 hours 03/09/19 2321 03/10/19 1234   03/09/19 2330  meropenem (MERREM) 1 g in sodium chloride 0.9 % 100 mL IVPB  Status:  Discontinued     1 g 200 mL/hr over 30 Minutes Intravenous Every 8 hours 03/09/19 2324 03/10/19 1247   03/09/19 2330  vancomycin (VANCOREADY) IVPB 1500 mg/300 mL     1,500 mg 150 mL/hr over 120 Minutes Intravenous  Once 03/09/19 2324 03/10/19 0601       Time Spent in minutes  30   Lala Lund M.D on 03/11/2019 at 12:03 PM  To page go to [email or web address] - password [facility]  [facility] -  Office  [phone]    See all Orders from today for further details    Objective:   Vitals:   03/10/19 1944 03/10/19 1945 03/10/19 2000 03/11/19 0800  BP:   (!) 146/58 (!) 121/59  Pulse:   (!) 107 (!) 107  Resp: (!) '21 19 19 '$ (!) 21  Temp:   97.6 F (36.4 C)   TempSrc:   Axillary   SpO2:   94% 96%  Weight:      Height:        Wt Readings from Last 3 Encounters:  03/10/19 80.9 kg  02/27/19 88 kg  01/12/19 82.1 kg    No intake or output data in the 24 hours ending 03/11/19 1203   Physical Exam   Somnolent but arousable, answers basic questions, generalized weakness in all 4 extremities, able to move all extremities to verbal commands.   [place].AT,PERRAL Supple Neck,No JVD, No cervical lymphadenopathy appriciated.  Symmetrical Chest wall movement, Good air movement bilaterally, CTAB RRR,No Gallops, Rubs or new Murmurs, No Parasternal Heave +ve B.Sounds, Abd Soft, No tenderness, No organomegaly appriciated, No rebound - guarding or rigidity. No Cyanosis, Clubbing or edema, No new Rash or bruise     Data Review:    CBC Recent Labs  Lab 03/09/19 1944 03/11/19 0747  WBC 8.2 13.6*  HGB 7.8* 8.6*  HCT 25.2* 26.7*  PLT 210 288  MCV 94.4 90.2  MCH 29.2 29.1  MCHC 31.0 32.2  RDW 18.3* 18.2*  LYMPHSABS 1.7 1.3  MONOABS 0.5 0.4  EOSABS 0.4 0.2  BASOSABS 0.0 0.1     Chemistries  Recent Labs  Lab 03/09/19 1944 03/10/19 0923 03/10/19 1846 03/11/19 0747  NA 137 139  --  143  K 4.8 6.0* 4.7 4.1  CL 108 112*  --  112*  CO2 18* 13*  --  18*  GLUCOSE 143* 98  --  161*  BUN 19 19  --  23  CREATININE 3.00* 2.78*  --  2.94*  CALCIUM 7.8* 7.8*  --  8.1*  MG  --  1.4*  --  2.0  AST  --   --   --  16  ALT  --   --   --  12  ALKPHOS  --   --   --  105  BILITOT  --   --   --  0.7   ------------------------------------------------------------------------------------------------------------------ No results for input(s): CHOL, HDL, LDLCALC, TRIG, CHOLHDL, LDLDIRECT in the last 72 hours.  Lab Results  Component Value Date   HGBA1C 6.6 (H) 02/15/2019   ------------------------------------------------------------------------------------------------------------------ Recent Labs    03/10/19 1846  TSH 0.857    Cardiac Enzymes No results for input(s): CKMB, TROPONINI, MYOGLOBIN in the last 168 hours.  Invalid input(s): CK ------------------------------------------------------------------------------------------------------------------    Component Value Date/Time   BNP 347.6 (H) 03/11/2019 0747    Micro Results Recent Results (from the past 240 hour(s))  Blood Culture (routine x 2)     Status: None (Preliminary result)   Collection Time: 03/09/19  7:44 PM   Specimen: BLOOD  Result Value Ref Range Status   Specimen Description BLOOD RIGHT ANTECUBITAL  Final   Special Requests   Final    BOTTLES DRAWN AEROBIC AND ANAEROBIC Blood Culture results may not be optimal due to an inadequate volume of blood received in culture bottles   Culture   Final    NO GROWTH < 12 HOURS Performed at [facility], [place] [address] [place], [place] [address]    Report Status PENDING  Incomplete  Blood Culture (routine x 2)     Status: None (Preliminary result)   Collection Time: 03/09/19  7:49 PM   Specimen: BLOOD  Result Value Ref Range Status    Specimen Description BLOOD LEFT ANTECUBITAL  Final   Special Requests   Final    BOTTLES DRAWN AEROBIC AND ANAEROBIC Blood Culture results may not be optimal due to an inadequate volume of blood received in culture bottles   Culture   Final    NO GROWTH < 12 HOURS Performed at [facility], [place] [address] [place], [place] [address]    Report Status PENDING  Incomplete  Urine culture     Status: Abnormal   Collection Time: 03/09/19  8:00 PM   Specimen: In/Out Cath Urine  Result Value Ref Range Status   Specimen Description IN/OUT CATH URINE  Final   Special Requests   Final    NONE Performed at [facility], [place] [address], [place], [place] [address]    Culture >=100,000 COLONIES/mL YEAST (A)  Final   Report Status 03/10/2019 FINAL  Final    Radiology Reports CT Head Wo Contrast  Result Date: 03/09/2019 CLINICAL DATA:  Altered mental status and increasing confusion, history of COVID-19 positivity EXAM: CT HEAD WITHOUT CONTRAST TECHNIQUE: Contiguous axial images were obtained from the base of the skull through the vertex without intravenous contrast. COMPARISON:  02/10/2018 FINDINGS: Brain: Mild atrophic changes and chronic white matter ischemic changes are seen stable from the prior exam. No findings to suggest acute hemorrhage, acute infarction or space-occupying mass lesion are noted. Vascular: No hyperdense vessel or unexpected calcification. Skull: Changes consistent with prior craniotomy are noted in right temporoparietal region Sinuses/Orbits: No acute finding. Other: None. IMPRESSION: Chronic atrophic and ischemic changes without acute abnormality. Electronically Signed   By: Inez Catalina M.D.   On: 03/09/2019 21:10   US RENAL  Result Date: 02/16/2019 CLINICAL DATA:  Acute kidney injury. EXAM: RENAL / URINARY TRACT ULTRASOUND COMPLETE COMPARISON:  None. FINDINGS: Right Kidney: Renal measurements: 8.7 x 5.3 x 5.2 cm = volume: 123 mL. 1.6 cm  cyst is noted in upper pole.  Increased echogenicity of renal parenchyma is noted. No mass or hydronephrosis visualized. Left Kidney: Renal measurements: 10.4 x 5.8 x 5.8 cm = volume: 183 mL. 2.1 cm cyst is seen in lower pole. Increased echogenicity of renal parenchyma is noted. No mass or hydronephrosis visualized. Bladder: Appears normal for degree of bladder distention. Other: None. IMPRESSION: Increased echogenicity of renal parenchyma is noted bilaterally consistent with medical renal disease. No hydronephrosis or renal obstruction is noted. Electronically Signed   By: Marijo Conception M.D.   On: 02/16/2019 09:43   DG Chest Portable 1 View  Result Date: 03/09/2019 CLINICAL DATA:  Altered mental status. Possible pulmonary infection. EXAM: PORTABLE CHEST 1 VIEW COMPARISON:  February 14, 2019. FINDINGS: Stable mild cardiomegaly. Central pulmonary vascular congestion without overt pulmonary edema or obvious pleural effusion. Low lung volumes. Linear atelectasis in the left lower lung zone. No pneumonia. Moderate skeletal degenerative change and telemetry leads. IMPRESSION: Stable mild cardiomegaly with central pulmonary vascular congestion. No pneumonia or pulmonary edema. Electronically Signed   By: Revonda Humphrey   On: 03/09/2019 21:41   DG Chest Port 1 View  Result Date: 02/14/2019 CLINICAL DATA:  Sepsis EXAM: PORTABLE CHEST 1 VIEW COMPARISON:  02/10/2018 FINDINGS: Heart and mediastinal contours are within normal limits. No focal opacities or effusions. No acute bony abnormality. IMPRESSION: No active disease. Electronically Signed   By: Rolm Baptise M.D.   On: 02/14/2019 18:29   Overnight EEG with video  Result Date: 03/10/2019 Lora Havens, MD     03/11/2019  9:03 AM Patient Name: KOURTLYN CHARLET MRN: [id number] Epilepsy Attending: Lora Havens Referring Physician/Provider: Dr Roland Rack Duration: 03/09/2019 2308 to 03/10/2019 2308 Patient history: 79 year old female presenting with polymyoclonus, fever of unclear  etiology and altered mental status. EEG to evaluate for seizure. Level of alertness: awake/lethargic AEDs during EEG study: None Technical aspects: This EEG study was done with scalp electrodes positioned according to the 10-20 International system of electrode placement. Electrical activity was acquired at a sampling rate of '500Hz'$  and reviewed with a high frequency filter of '70Hz'$  and a low frequency filter of '1Hz'$ . EEG data were recorded continuously and digitally stored. DESCRIPTION: No clear posterior dominant rhythm was seen. EEG showed continuous generalized and maximal right frontocentral region. Sharp waves were also seen in right frontocentral region. Hyperventilation and photic stimulation were not performed. Event button was pressed on 03/10/2019 at 1016 for unclear reason. Cocomitant eeg before, during and after the event didn't show any eeg change to suggest seizure. 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 No seizures were seen throughout the recording. Event button was pressed on 03/10/2019 at 1016 for unclear reason. Cocomitant eeg before, during and after the event didn't show any eeg change to suggest seizure. Priyanka O Yadav   VAS [place] LOWER EXTREMITY VENOUS (DVT)  Result Date: 03/10/2019  Lower Venous DVTStudy Indications: Elevated Ddimer.  Risk Factors: COVID 19 positive. Comparison Study: No prior studies. Performing Technologist: Oliver Hum RVT  Examination Guidelines: A complete evaluation includes B-mode imaging, spectral Doppler, color Doppler, and power Doppler as needed of all accessible portions of each vessel. Bilateral testing is considered an integral part of a complete examination. Limited examinations for reoccurring indications may be performed as noted. The reflux portion of the exam is  performed with the patient in reverse  Trendelenburg.  +---------+---------------+---------+-----------+----------+--------------+ RIGHT    CompressibilityPhasicitySpontaneityPropertiesThrombus Aging +---------+---------------+---------+-----------+----------+--------------+ CFV      Full           Yes      Yes                                 +---------+---------------+---------+-----------+----------+--------------+ SFJ      Full                                                        +---------+---------------+---------+-----------+----------+--------------+ FV Prox  Full                                                        +---------+---------------+---------+-----------+----------+--------------+ FV Mid   Full                                                        +---------+---------------+---------+-----------+----------+--------------+ FV DistalFull                                                        +---------+---------------+---------+-----------+----------+--------------+ PFV      Full                                                        +---------+---------------+---------+-----------+----------+--------------+ POP      Full           Yes      Yes                                 +---------+---------------+---------+-----------+----------+--------------+ PTV      Full                                                        +---------+---------------+---------+-----------+----------+--------------+ PERO     Full                                                        +---------+---------------+---------+-----------+----------+--------------+   +---------+---------------+---------+-----------+----------+--------------+ LEFT     CompressibilityPhasicitySpontaneityPropertiesThrombus Aging +---------+---------------+---------+-----------+----------+--------------+ CFV      Full           Yes      Yes                                  +---------+---------------+---------+-----------+----------+--------------+  SFJ      Full                                                        +---------+---------------+---------+-----------+----------+--------------+ FV Prox  Full                                                        +---------+---------------+---------+-----------+----------+--------------+ FV Mid   Full                                                        +---------+---------------+---------+-----------+----------+--------------+ FV Distal               Yes      Yes                                 +---------+---------------+---------+-----------+----------+--------------+ PFV      Full                                                        +---------+---------------+---------+-----------+----------+--------------+ POP      Full           Yes      Yes                                 +---------+---------------+---------+-----------+----------+--------------+ PTV      Full                                                        +---------+---------------+---------+-----------+----------+--------------+ PERO     Full                                                        +---------+---------------+---------+-----------+----------+--------------+     Summary: RIGHT: - There is no evidence of deep vein thrombosis in the lower extremity. However, portions of this examination were limited- see technologist comments above.  - No cystic structure found in the popliteal fossa.  LEFT: - There is no evidence of deep vein thrombosis in the lower extremity. However, portions of this examination were limited- see technologist comments above.  - No cystic structure found in the popliteal fossa.  *See table(s) above for measurements and observations. Electronically signed by Servando Snare MD on 03/10/2019 at 4:08:34 PM.    Final    VAS [place] LOWER EXTREMITY VENOUS (DVT)  Result Date: 02/22/2019   Lower  Venous DVTStudy Indications: Hx of DVT.  Limitations: Body habitus and sunlight in the room, uncooperative patient. Performing Technologist: Antonieta Pert RDMS, RVT  Examination Guidelines: A complete evaluation includes B-mode imaging, spectral Doppler, color Doppler, and power Doppler as needed of all accessible portions of each vessel. Bilateral testing is considered an integral part of a complete examination. Limited examinations for reoccurring indications may be performed as noted. The reflux portion of the exam is performed with the patient in reverse Trendelenburg.  +---------+---------------+---------+-----------+----------+-------------------+ RIGHT    CompressibilityPhasicitySpontaneityPropertiesThrombus Aging      +---------+---------------+---------+-----------+----------+-------------------+ CFV      Full           Yes      Yes                                      +---------+---------------+---------+-----------+----------+-------------------+ SFJ      Full                                                             +---------+---------------+---------+-----------+----------+-------------------+ FV Prox  Full                                                             +---------+---------------+---------+-----------+----------+-------------------+ FV Mid   Full                                                             +---------+---------------+---------+-----------+----------+-------------------+ FV DistalFull                                                             +---------+---------------+---------+-----------+----------+-------------------+ PFV      Full                                                             +---------+---------------+---------+-----------+----------+-------------------+ POP                                                   Not visualized       +---------+---------------+---------+-----------+----------+-------------------+ PTV  poor visualization                                                        due to pt                                                                 cooperation and                                                           tolerance.          +---------+---------------+---------+-----------+----------+-------------------+ PERO                                                  poor visualization                                                        due to pt                                                                 cooperation and                                                           tolerance.          +---------+---------------+---------+-----------+----------+-------------------+ GSV      Full                                                             +---------+---------------+---------+-----------+----------+-------------------+   +---------+---------------+---------+-----------+----------+-------------------+ LEFT     CompressibilityPhasicitySpontaneityPropertiesThrombus Aging      +---------+---------------+---------+-----------+----------+-------------------+ CFV      Full           Yes      Yes                                      +---------+---------------+---------+-----------+----------+-------------------+ SFJ      Full                                                             +---------+---------------+---------+-----------+----------+-------------------+  FV Prox  Full                                                             +---------+---------------+---------+-----------+----------+-------------------+ FV Mid   Full                                                             +---------+---------------+---------+-----------+----------+-------------------+ FV  Distal                                             Not visualized      +---------+---------------+---------+-----------+----------+-------------------+ PFV      Full                                                             +---------+---------------+---------+-----------+----------+-------------------+ POP                     Yes      Yes                  unable to compress                                                        due to pt                                                                 cooperation and                                                           tolerance.          +---------+---------------+---------+-----------+----------+-------------------+ PTV                                                   not well visualized +---------+---------------+---------+-----------+----------+-------------------+ PERO  not well visualized +---------+---------------+---------+-----------+----------+-------------------+ GSV      Full                                                             +---------+---------------+---------+-----------+----------+-------------------+     Summary: RIGHT: - There is no evidence of deep vein thrombosis in the lower extremity. However, portions of this examination were limited- see technologist comments above.  - A cystic structure is found in the popliteal fossa.  LEFT: - There is no evidence of deep vein thrombosis in the lower extremity. However, portions of this examination were limited- see technologist comments above.  - No cystic structure found in the popliteal fossa.  *See table(s) above for measurements and observations. Electronically signed by Deitra Mayo MD on 02/22/2019 at 4:12:36 PM.    Final    VAS [place] LOWER EXTREMITY VENOUS (DVT)  Result Date: 02/17/2019  Lower Venous DVTStudy Indications: Elevated ddimer.  Limitations: Body habitus,  poor ultrasound/tissue interface and pain tolerance. Comparison Study: 02/10/18 previous Performing Technologist: Abram Sander RVS  Examination Guidelines: A complete evaluation includes B-mode imaging, spectral Doppler, color Doppler, and power Doppler as needed of all accessible portions of each vessel. Bilateral testing is considered an integral part of a complete examination. Limited examinations for reoccurring indications may be performed as noted. The reflux portion of the exam is performed with the patient in reverse Trendelenburg.  +---------+---------------+---------+-----------+----------+--------------+ RIGHT    CompressibilityPhasicitySpontaneityPropertiesThrombus Aging +---------+---------------+---------+-----------+----------+--------------+ CFV      Full           Yes      Yes                                 +---------+---------------+---------+-----------+----------+--------------+ SFJ      Full                                                        +---------+---------------+---------+-----------+----------+--------------+ FV Prox  Full                                                        +---------+---------------+---------+-----------+----------+--------------+ FV Mid   Full                                                        +---------+---------------+---------+-----------+----------+--------------+ FV Distal               Yes      Yes                                 +---------+---------------+---------+-----------+----------+--------------+ PFV      Full                                                        +---------+---------------+---------+-----------+----------+--------------+  POP      Full           Yes      Yes                                 +---------+---------------+---------+-----------+----------+--------------+ PTV      Full                                                         +---------+---------------+---------+-----------+----------+--------------+ PERO                                                  Not visualized +---------+---------------+---------+-----------+----------+--------------+   +---------+---------------+---------+-----------+----------+--------------+ LEFT     CompressibilityPhasicitySpontaneityPropertiesThrombus Aging +---------+---------------+---------+-----------+----------+--------------+ CFV      Full           Yes      Yes                                 +---------+---------------+---------+-----------+----------+--------------+ SFJ      Full                                                        +---------+---------------+---------+-----------+----------+--------------+ FV Prox  Full                                                        +---------+---------------+---------+-----------+----------+--------------+ FV Mid   Full                                                        +---------+---------------+---------+-----------+----------+--------------+ FV Distal               Yes      Yes                                 +---------+---------------+---------+-----------+----------+--------------+ PFV      Full                                                        +---------+---------------+---------+-----------+----------+--------------+ POP      Full           Yes      Yes                                 +---------+---------------+---------+-----------+----------+--------------+  PTV      Full                                                        +---------+---------------+---------+-----------+----------+--------------+ PERO                                                  Not visualized +---------+---------------+---------+-----------+----------+--------------+     Summary: BILATERAL: - No evidence of deep vein thrombosis seen in the lower extremities, bilaterally.   *See table(s)  above for measurements and observations. Electronically signed by Harold Barban MD on 02/17/2019 at 7:28:13 AM.    Final

## 2019-03-11 NOTE — Progress Notes (Signed)
Pharmacy Antibiotic Note  [name] is a 79 y.o. female admitted on 03/09/2019 with r/o meningitis.  Pharmacy has been consulted for Vanco dosing.  ID: r/o meningitis, attempted LP in ED, now requested LP in IR. WBC 8.2>13.6. Scr 2.94 **out of quarantine period for COVID; now negative.   Vanc 3/4 >>  Meropenem 3/4 >>  Acyclovir 3/4 >>  3/5: Vanco Random: 15  3/3 BCx x 2:  3/3 UCx:  >100,000 yeast   Plan: S/p Vancomycin 1500mg  IV x 1,  then 1g IV q48h. Change Acyclovir to 800mg  IV q24h for CrCl <30 Change Merrem to 1gm IV q12h for CrCl <30 Fluconazole 100mg  IV x 1 per Dr. Candiss [name] Needs LP in IR   Height: 5\' 4"  (162.6 cm) Weight: 178 lb 5.6 oz (80.9 kg) IBW/kg (Calculated) : 54.7  Temp (24hrs), Avg:99.6 F (37.6 C), Min:97.6 F (36.4 C), Max:101.6 F (38.7 C)  Recent Labs  Lab 03/09/19 1944 03/10/19 0923 03/11/19 0747  WBC 8.2  --  13.6*  CREATININE 3.00* 2.78* 2.94*  LATICACIDVEN 1.6  --   --   VANCORANDOM  --   --  15    Estimated Creatinine Clearance: 15.7 mL/min (A) (by C-G formula based on SCr of 2.94 mg/dL (H)).    Allergies  Allergen Reactions  . Methotrexate Derivatives Other (See Comments)    Elevated creat. Levels  . Actemra [Tocilizumab] Rash  . Other Other (See Comments)    NO Blood products.  [name]- causes low blood sugar (pt states she was taking medication but cant remember what it was for.)  . Penicillins Rash    Has patient had a PCN reaction causing immediate rash, facial/tongue/throat swelling, SOB or lightheadedness with hypotension: Yes Has patient had a PCN reaction causing severe rash involving mucus membranes or skin necrosis: Yes  Has patient had a PCN reaction that required hospitalization No Has patient had a PCN reaction occurring within the last 10 years: No If all of the above answers are "NO", then may proceed with Cephalosporin use.   . Chlorhexidine   . Remicade [Infliximab] Other (See Comments)    Inadequate  response   . Zocor [Simvastatin] Other (See Comments)    Leg cramps     Julia Odaniel S. [name], PharmD, BCPS Clinical Staff Pharmacist [email or web address] [name] 03/11/2019 10:35 AM

## 2019-03-11 NOTE — Evaluation (Signed)
Clinical/Bedside Swallow Evaluation Patient Details  Name: [name] MRN: [id number] Date of Birth: April 07, 1940  Today's Date: 03/11/2019 Time: SLP Start Time (ACUTE ONLY): 0354 SLP Stop Time (ACUTE ONLY): 0835 SLP Time Calculation (min) (ACUTE ONLY): 24 min  Past Medical History:  Past Medical History:  Diagnosis Date  . Allergic rhinitis   . Anemia   . Aortic stenosis 12/22/2012   Mild 3/14-2.35 m/s peak velocity  . Cataract 2018  . CHF (congestive heart failure) ([place])   . Chronic pain   . CKD (chronic kidney disease), stage III   . Diabetes mellitus   . Diverticulosis   . DVT (deep venous thrombosis) ([place])   . Glaucoma   . Hemorrhoids    internal  . HTN (hypertension)   . Hypercholesteremia   . Hyperlipidemia 12/22/2012  . Hypothyroidism   . Meningioma ([place]) 2008  . Osteoarthritis   . Peripheral neuropathy   . PPD positive    6 months ago  . PVC (premature ventricular contraction) 12/22/2012  . Rheumatoid arthritis(714.0)   . Sickle cell trait ([place])   . 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 ABDOMINAL HYSTERECTOMY  1978   TAH,& BSO 1  . CATARACT EXTRACTION Bilateral   . EYE SURGERY    . fistula repair  03/2010   partial colectomy  . intracranial surgery  11/2007   removal of meningioma  . JOINT REPLACEMENT  2001   left TKR  . LEFT HEART CATHETERIZATION WITH CORONARY ANGIOGRAM N/A 08/08/2013   Procedure: LEFT HEART CATHETERIZATION WITH CORONARY ANGIOGRAM;  Surgeon: Peter M Martinique, MD;  Location: [facility] CATH LAB;  Service: Cardiovascular;  Laterality: N/A;  . ROTATOR CUFF REPAIR  1999   BIlateral  . SIGMOIDECTOMY  2012  . TOE SURGERY Left 2018  . TONSILLECTOMY AND ADENOIDECTOMY    . TOTAL THYROIDECTOMY  1992  . wrist sx     right    HPI:  79 yo female adm to [facility] with acute encephalopathy.  Pt with h/o menigioma s/p  frontal craniotomy, HTN, chronic anemia, RA, DM, aortic stenosis and HLD.  Pt with CXR 03/09/2019 stable cardiomegaly with centeral pulmonary vascular congestion. 03/09/2019 chronic ischemia.  Pt is for a LP in IR today.   Assessment / Plan / Recommendation Clinical Impression  Patient presents with mild oral dysphagia likely soley due to mentation.  She is perseverating frequently today during session and at one point became lethargic requiring max verbal/tactile cues to wake adequately to continue  SLP did not conduct [place] swallow screen due to pt's mentation. No focal CN deficits apparent but pt's voice and cough appear subjectively weak.  Swalllow of liquids was timely clinically but mildly prolonged mastication of solids noted with trace residuals that consumption of liquids helped to clear. Pt normally uses full set of dentures for eating per her statement- but only upper in place at this time and SLP did not locate lower in the room.  Recommend dys3/thin diet with medications given with applesauce due to oral deficits.  SLP will follow up to assure tolerance givenpt's mentation, perseverations, etc.  Educated pt to findings/recommendations. SLP Visit Diagnosis: Dysphagia, oral phase (R13.11)    Aspiration Risk  Moderate aspiration risk    Diet Recommendation Dysphagia 3 (Mech soft);Thin liquid   Liquid Administration via: Cup;Straw Medication Administration: Whole meds with puree Supervision:  Staff to assist with self feeding Compensations: Minimize environmental distractions;Slow rate;Small sips/bites Postural Changes: Seated upright at 90 degrees;Remain upright for at least 30 minutes after po intake    Other  Recommendations Oral Care Recommendations: Oral care BID   Follow up Recommendations Other (comment)(TBD)      Frequency and Duration min 1 x/week  1 week       Prognosis Prognosis for Safe Diet Advancement: Fair Barriers to Reach Goals: Cognitive deficits      Swallow  Study   General Date of Onset: 03/11/19 HPI: 79 yo female adm to [facility] with acute encephalopathy.  Pt with h/o menigioma s/p frontal craniotomy, HTN, chronic anemia, RA, DM, aortic stenosis and HLD.  Pt with CXR 03/09/2019 stable cardiomegaly with centeral pulmonary vascular congestion. 03/09/2019 chronic ischemia.  Pt is for a LP in IR today. Type of Study: Bedside Swallow Evaluation Previous Swallow Assessment: BSE 02/2016 regular/thin Diet Prior to this Study: Regular;Thin liquids Temperature Spikes Noted: No Respiratory Status: Room air History of Recent Intubation: No Behavior/Cognition: Alert;Cooperative;Pleasant mood Oral Cavity Assessment: Within Functional Limits Oral Care Completed by SLP: No Oral Cavity - Dentition: Dentures, top(pt other dentures at home) Self-Feeding Abilities: Needs assist Patient Positioning: Upright in bed Baseline Vocal Quality: Low vocal intensity Volitional Cough: Weak Volitional Swallow: Able to elicit    Oral/Motor/Sensory Function Overall Oral Motor/Sensory Function: Within functional limits   Ice Chips Ice chips: Within functional limits Presentation: Spoon   Thin Liquid Thin Liquid: Within functional limits Presentation: Straw;Cup;Self Fed    Nectar Thick Nectar Thick Liquid: Not tested   Honey Thick Honey Thick Liquid: Not tested   Puree Puree: Impaired Presentation: Self Fed;Spoon Oral Phase Impairments: Reduced lingual movement/coordination;Poor awareness of bolus Oral Phase Functional Implications: Prolonged oral transit Pharyngeal Phase Impairments: Suspected delayed Swallow   Solid     Solid: Impaired Presentation: Self Fed Oral Phase Impairments: Impaired mastication;Poor awareness of bolus;Reduced lingual movement/coordination      Macario Golds 03/11/2019,8:46 AM Kathleen Lime, MS [place] Office [phone]

## 2019-03-11 NOTE — Progress Notes (Signed)
LTM discontinued. No skin break down noted. 

## 2019-03-11 NOTE — Consult Note (Signed)
   [facility] CM Inpatient Consult   03/11/2019  [name] 07/12/1940 [id number]   Patient screened for high risk score for unplanned readmission score and with less than 30 day readmission for hospitalizations.  Patient in the Medicare [place] [ACO] with [place]. Checking for barriers or services for [facility] Care Management needs.  Review of patient's medical record from MD History and Physical HPI notes which includes but not limited to reveals patient is:  [name] is a 79 y.o. female with PMH hypothyroidism, hx of craniectomy(for frontal temporary brain meningioma resection), CKD, rheumatoid arthritis, DM chronic anemia, chronic pain, recent COVID infection 2/8 and aortic stenosis who presents to ED for AMS and fever and admitted for further workup.  Hx provided completely by daughter who is at bedside as patient is lethargic and unable to respond to questions. Patient arrived by EMS. She lives with her daughter. At baseline, daughter reports she is ambulatory and completely coherent. She was recently hospitalized in Feb for COVID infection and discharged 2/21.   Primary Care Provider is  Jonathon Jordan, MD  Pharmacy is: [facility], [place]  Plan:  Follow with inpatient [facility] team notes for progression and needs as appropriate.  Please place a [facility] Care Management consult as appropriate and for questions contact:   Natividad Brood, RN BSN [facility] Liaison  [phone] business mobile phone Toll free office [phone]  Fax number: [phone] [email or web address]

## 2019-03-12 ENCOUNTER — Inpatient Hospital Stay (HOSPITAL_COMMUNITY): Payer: Medicare Other

## 2019-03-12 LAB — COMPREHENSIVE METABOLIC PANEL
ALT: 11 U/L (ref 0–44)
AST: 13 U/L — ABNORMAL LOW (ref 15–41)
Albumin: 1.4 g/dL — ABNORMAL LOW (ref 3.5–5.0)
Alkaline Phosphatase: 94 U/L (ref 38–126)
Anion gap: 10 (ref 5–15)
BUN: 27 mg/dL — ABNORMAL HIGH (ref 8–23)
CO2: 21 mmol/L — ABNORMAL LOW (ref 22–32)
Calcium: 8.2 mg/dL — ABNORMAL LOW (ref 8.9–10.3)
Chloride: 112 mmol/L — ABNORMAL HIGH (ref 98–111)
Creatinine, Ser: 2.94 mg/dL — ABNORMAL HIGH (ref 0.44–1.00)
GFR calc Af Amer: 17 mL/min — ABNORMAL LOW (ref >60)
GFR calc non Af Amer: 14 mL/min — ABNORMAL LOW (ref >60)
Glucose, Bld: 127 mg/dL — ABNORMAL HIGH (ref 70–99)
Potassium: 3.7 mmol/L (ref 3.5–5.1)
Sodium: 143 mmol/L (ref 135–145)
Total Bilirubin: 0.8 mg/dL (ref 0.3–1.2)
Total Protein: 4.9 g/dL — ABNORMAL LOW (ref 6.5–8.1)

## 2019-03-12 LAB — CBC WITH DIFFERENTIAL/PLATELET
Abs Immature Granulocytes: 0.2 10*3/uL — ABNORMAL HIGH (ref 0.00–0.07)
Basophils Absolute: 0 10*3/uL (ref 0.0–0.1)
Basophils Relative: 0 %
Eosinophils Absolute: 0.5 10*3/uL (ref 0.0–0.5)
Eosinophils Relative: 5 %
HCT: 23.2 % — ABNORMAL LOW (ref 36.0–46.0)
Hemoglobin: 7.5 g/dL — ABNORMAL LOW (ref 12.0–15.0)
Immature Granulocytes: 2 %
Lymphocytes Relative: 11 %
Lymphs Abs: 1.2 10*3/uL (ref 0.7–4.0)
MCH: 28.7 pg (ref 26.0–34.0)
MCHC: 32.3 g/dL (ref 30.0–36.0)
MCV: 88.9 fL (ref 80.0–100.0)
Monocytes Absolute: 0.5 10*3/uL (ref 0.1–1.0)
Monocytes Relative: 4 %
Neutro Abs: 9.3 10*3/uL — ABNORMAL HIGH (ref 1.7–7.7)
Neutrophils Relative %: 78 %
Platelets: 298 10*3/uL (ref 150–400)
RBC: 2.61 MIL/uL — ABNORMAL LOW (ref 3.87–5.11)
RDW: 18.2 % — ABNORMAL HIGH (ref 11.5–15.5)
WBC: 11.8 10*3/uL — ABNORMAL HIGH (ref 4.0–10.5)
nRBC: 0.2 % (ref 0.0–0.2)

## 2019-03-12 LAB — MRSA PCR SCREENING: MRSA by PCR: NEGATIVE

## 2019-03-12 LAB — D-DIMER, QUANTITATIVE: D-Dimer, Quant: 17.05 ug/mL-FEU — ABNORMAL HIGH (ref 0.00–0.50)

## 2019-03-12 LAB — GLUCOSE, CAPILLARY
Glucose-Capillary: 107 mg/dL — ABNORMAL HIGH (ref 70–99)
Glucose-Capillary: 103 mg/dL — ABNORMAL HIGH (ref 70–99)
Glucose-Capillary: 105 mg/dL — ABNORMAL HIGH (ref 70–99)
Glucose-Capillary: 136 mg/dL — ABNORMAL HIGH (ref 70–99)

## 2019-03-12 LAB — BRAIN NATRIURETIC PEPTIDE: B Natriuretic Peptide: 182.1 pg/mL — ABNORMAL HIGH (ref 0.0–100.0)

## 2019-03-12 LAB — MAGNESIUM: Magnesium: 2 mg/dL (ref 1.7–2.4)

## 2019-03-12 LAB — C-REACTIVE PROTEIN: CRP: 24.9 mg/dL — ABNORMAL HIGH (ref <1.0)

## 2019-03-12 MED ORDER — METRONIDAZOLE IN NACL 5-0.79 MG/ML-% IV SOLN
500.0000 mg | Freq: Three times a day (TID) | INTRAVENOUS | Status: DC
  Administered 2019-03-12 – 2019-03-22 (×30): 500 mg via INTRAVENOUS
  Filled 2019-03-12 (×30): qty 100

## 2019-03-12 MED ORDER — HEPARIN SODIUM (PORCINE) 5000 UNIT/ML IJ SOLN
7500.0000 [IU] | Freq: Three times a day (TID) | INTRAMUSCULAR | Status: DC
  Administered 2019-03-12 – 2019-03-14 (×5): 7500 [IU] via SUBCUTANEOUS
  Filled 2019-03-12 (×5): qty 2

## 2019-03-12 MED ORDER — SODIUM CHLORIDE 0.9 % IV SOLN
2.0000 g | INTRAVENOUS | Status: AC
  Administered 2019-03-12 – 2019-03-14 (×3): 2 g via INTRAVENOUS
  Filled 2019-03-12 (×3): qty 20

## 2019-03-12 MED ORDER — VANCOMYCIN VARIABLE DOSE PER UNSTABLE RENAL FUNCTION (PHARMACIST DOSING): Status: DC

## 2019-03-12 MED ORDER — LEVOTHYROXINE SODIUM 75 MCG PO TABS
150.0000 ug | ORAL_TABLET | Freq: Every day | ORAL | Status: DC
  Administered 2019-03-13 – 2019-03-22 (×10): 150 ug via ORAL
  Filled 2019-03-12 (×10): qty 2

## 2019-03-12 NOTE — Progress Notes (Signed)
Pharmacy Antibiotic Note  [name] is a 79 y.o. female admitted on 03/09/2019 with r/o meningitis.  Pharmacy was consulted for vancomycin and meropenem dosing. Patient is on vancomycin 1000mg  IV q48h and meropenem 1g IV q12h. LP was obtained and found not be infectious - WBC 1, glucose 85, protein 54. Neurology discontinued acyclovir.   Patient previously on vancomycin nomogram dosing for meningitis. Will transition to AUC dosing with meningitis r/o. With current regimen of vancomycin 1000mg  IV q48h and Scr of 2.94 estimated AUC would be supratherapeutic at 806 (goal 400-550). Though given baseline renal function unknown (CKD3 - Scr 2.94 with most recent Scr 1.68 in November 2020) will obtain vancomycin random level before starting maintenance regimen.    Plan: Obtain vancomycin random level tomorrow morning  Continue meropenem 1000mg  IV q12h  Monitor renal function, cultures/sensitivities, and clinical progression     Height: 5\' 4"  (162.6 cm) Weight: 178 lb 5.6 oz (80.9 kg) IBW/kg (Calculated) : 54.7  Temp (24hrs), Avg:98 F (36.7 C), Min:98 F (36.7 C), Max:98 F (36.7 C)  Recent Labs  Lab 03/09/19 1944 03/10/19 0923 03/11/19 0747 03/12/19 0245  WBC 8.2  --  13.6* 11.8*  CREATININE 3.00* 2.78* 2.94* 2.94*  LATICACIDVEN 1.6  --   --   --   VANCORANDOM  --   --  15  --     Estimated Creatinine Clearance: 15.7 mL/min (A) (by C-G formula based on SCr of 2.94 mg/dL (H)).    Allergies  Allergen Reactions  . Methotrexate Derivatives Other (See Comments)    Elevated creat. Levels  . Actemra [Tocilizumab] Rash  . Other Other (See Comments)    NO Blood products.  [name]- causes low blood sugar (pt states she was taking medication but cant remember what it was for.)  . Penicillins Rash    Has patient had a PCN reaction causing immediate rash, facial/tongue/throat swelling, SOB or lightheadedness with hypotension: Yes Has patient had a PCN reaction causing severe  rash involving mucus membranes or skin necrosis: Yes  Has patient had a PCN reaction that required hospitalization No Has patient had a PCN reaction occurring within the last 10 years: No If all of the above answers are "NO", then may proceed with Cephalosporin use.   . Chlorhexidine   . Remicade [Infliximab] Other (See Comments)    Inadequate response   . Zocor [Simvastatin] Other (See Comments)    Leg cramps     Antimicrobials this admission: 3/4 Vanc >>  3/4 Meropenem >>  3/4 Acyclovir>> 3/5   Microbiology results: 3/3 BCx: ngtd  3/3 UCx:  100k yeast  Cdiff PCR:  3/5 CSF: no growth <24 hrs; no orgs on gram stain   Thank you for allowing pharmacy to be a part of this patient's care.  Cristela Felt, PharmD PGY1 Pharmacy Resident [employer]: [phone]  03/12/2019 10:18 AM

## 2019-03-12 NOTE — TOC Initial Note (Signed)
Transition of Care [facility]) - Initial/Assessment Note    Patient Details  Name: [name] MRN: [id number] Date of Birth: 08-May-1940  Transition of Care [facility]) CM/SW Contact:    Gelene Mink, [place] Phone Number: 03/12/2019, 12:53 PM  Clinical Narrative:                  CSW called and spoke with the patient's daughter, Floy Sabina. CSW introduced herself, explained her role, and shared therapy recommendation. She stated that she would like to discuss with her father and sister. CSW provided CMS home health and SNF list via email. The patient was receiving home health services with [facility] prior to coming into the hospital. [name] stated that she could possibly continue with [facility], but the family would have to make the decision together. CSW asked if she could reach back out on Sunday to see if the family has made a decision. She agreed.   Patient tested positive for COVID back in February. She is now testing negative.   CSW will plan on following up with the family for disposition plan.   Expected Discharge Plan: (SNF vs. HH) Barriers to Discharge: Continued Medical Work up, Ship broker   Patient Goals and CMS Choice Patient states their goals for this hospitalization and ongoing recovery are:: Pt daughter wants her mother to feel better CMS Medicare.gov Compare Post Acute Care list provided to:: Patient Represenative (must comment) Choice offered to / list presented to : Adult Children  Expected Discharge Plan and Services Expected Discharge Plan: (SNF vs. HH) In-house Referral: Clinical Social Work   Post Acute Care Choice: (SNF vs. HH) Living arrangements for the past 2 months: Single Family Home                                      Prior Living Arrangements/Services Living arrangements for the past 2 months: Single Family Home Lives with:: Spouse, Adult Children Patient language and need for interpreter reviewed:: No Do you feel safe going  back to the place where you live?: Yes      Need for Family Participation in Patient Care: Yes (Comment) Care giver support system in place?: Yes (comment) Current home services: Home PT, Home OT Criminal Activity/Legal Involvement Pertinent to Current Situation/Hospitalization: No - Comment as needed  Activities of Daily Living Home Assistive Devices/Equipment: Cane (specify quad or straight) ADL Screening (condition at time of admission) Patient's cognitive ability adequate to safely complete daily activities?: No Is the patient deaf or have difficulty hearing?: No Does the patient have difficulty seeing, even when wearing glasses/contacts?: No Does the patient have difficulty concentrating, remembering, or making decisions?: Yes Patient able to express need for assistance with ADLs?: Yes Does the patient have difficulty dressing or bathing?: No Independently performs ADLs?: Yes (appropriate for developmental age) Does the patient have difficulty walking or climbing stairs?: No Weakness of Legs: Both Weakness of Arms/Hands: None  Permission Sought/Granted Permission sought to share information with : Case Manager Permission granted to share information with : Yes, Verbal Permission Granted  Share Information with NAME: [name]     Permission granted to share info w Relationship: Daughter     Emotional Assessment Appearance:: Appears stated age Attitude/Demeanor/Rapport: Unable to Assess Affect (typically observed): Unable to Assess Orientation: : Oriented to Self Alcohol / Substance Use: Not Applicable Psych Involvement: No (comment)  Admission diagnosis:  Acute encephalopathy [G93.40] Fever, unspecified  fever cause [R50.9] Altered mental status, unspecified altered mental status type [R41.82] Patient Active Problem List   Diagnosis Date Noted  . Fever 03/10/2019  . Anemia of chronic disease 03/10/2019  . Acute renal failure superimposed on stage 3 chronic kidney disease  ([place]) 02/15/2019  . Elevated troponin 02/15/2019  . LBBB (left bundle branch block) 02/15/2019  . Refusal of blood transfusions as patient is Jehovah's Witness   . Near syncope 02/12/2018  . Orthostatic hypotension 02/12/2018  . Symptomatic bradycardia 02/10/2018  . Primary osteoarthritis of right knee 09/25/2016  . 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 Elevated d-dimer   . Encephalopathy   . Sinus bradycardia   . Acute encephalopathy 02/27/2016  . Seropositive rheumatoid arthritis of multiple sites ([place]) 01/17/2016  . Bilateral hand pain 01/17/2016  . Bilateral wrist pain 01/17/2016  . Pain in right elbow 01/17/2016  . Left shoulder pain 01/17/2016  . Encounter for therapeutic drug monitoring 11/02/2015  . Bronchiectasis without complication ([place]) 79/89/2119  . Chronic gout involving toe without tophus 10/30/2015  . High risk medication use 10/29/2015  . Gout 10/29/2015  . H/O total knee replacement, left 10/29/2015  . 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 Diabetes mellitus, type II, insulin dependent ([place]) 10/18/2015  . AKI (acute kidney injury) ([place]) 10/18/2015  . CKD (chronic kidney disease) stage 3, GFR 30-59 ml/min ([facility]) 10/18/2015  . Nodule of left lung 10/18/2015  . Neuropathy 08/22/2014  . Hypothyroidism 08/22/2014  . Chronic pain 08/22/2014  . Chronic congestive heart failure with left ventricular diastolic dysfunction ([place]) 08/08/2013  . Chest pain 08/05/2013  . Aortic stenosis 12/22/2012  . Hyperlipidemia 12/22/2012  . PVC (premature ventricular contraction) 12/22/2012  . HTN (hypertension) 12/22/2012  . 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 Open angle with borderline findings, high risk 12/15/2011  . Cataract, nuclear 05/25/2011   PCP:  Jonathon Jordan, MD Pharmacy:   [place], [place] - [address] [place] [place] [address] Phone: [phone]  Fax: [phone]     Social Determinants of Health (SDOH) Interventions    Readmission Risk Interventions No flowsheet data found.

## 2019-03-12 NOTE — Progress Notes (Signed)
Occupational Therapy Evaluation Patient Details Name: [name] MRN: [id number] DOB: 1940/02/28 Today's Date: 03/12/2019    History of Present Illness Pt is is an 79 y.o. 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 Daughter reports jerking movements of pt's legs and odd chewing motions prior to admit. No szs found on EEG. Pt had multiple falls prior to admit and found to have toxic & metabolic encephalopathy.   Clinical Impression   Patient very lethargic and not responding consistently to simple commands.  She was not oriented and having difficulty keeping attention.  UE strength was very weak, 2/5 grossly.  Able to wash off face at bed level with set up and cueing.  Patient ate 1 bite of lunch with set up and refused more.  Will further evaluate functional mobility during future visit as patient too lethargic to attempt today.  Will continue to follow with OT acutely to address the deficits listed below.      Follow Up Recommendations  SNF    Equipment Recommendations  None recommended by OT    Recommendations for Other Services       Precautions / Restrictions Precautions Precautions: Fall;Other (comment) Restrictions Weight Bearing Restrictions: No      Mobility Bed Mobility Overal bed mobility: Needs Assistance                Transfers Overall transfer level: Needs assistance                    Balance Overall balance assessment: Needs assistance   Sitting balance-Leahy Scale: Poor                                     ADL either performed or assessed with clinical judgement   ADL Overall ADL's : Needs assistance/impaired Eating/Feeding: Set up;Cueing for sequencing;Bed level   Grooming: Set up;Bed level    Upper Body Bathing: Minimal assistance;Bed level   Lower Body Bathing: Maximal assistance;Bed level   Upper Body Dressing : Minimal assistance;Bed level   Lower Body Dressing: Maximal assistance;Bed level                 General ADL Comments: Did not leave bed level today. Will further evaluate in future when patient is less lethargic     Vision         Perception     Praxis      Pertinent Vitals/Pain Pain Assessment: Faces Faces Pain Scale: No hurt     Hand Dominance Right   Extremity/Trunk Assessment Upper Extremity Assessment Upper Extremity Assessment: Generalized weakness;RUE deficits/detail;LUE deficits/detail RUE Deficits / Details: Very weak. 2/5 grossly. Shld flex AROM ~45 degrees. Elbow, wrist, and digit ROM WFL.  LUE Deficits / Details: Very weak. 2/5 grossly. Shld flex AROM ~45 degrees. Elbow, wrist, and digit ROM WFL.            Communication Communication Communication: No difficulties;Other (comment)(Slow responding time)   Cognition Arousal/Alertness: Lethargic Behavior During Therapy: Flat affect Overall Cognitive Status: Impaired/Different from baseline Area of Impairment: Orientation;Attention;Memory;Following commands;Safety/judgement;Awareness;Problem solving                 Orientation Level: Place;Time;Situation;Disoriented to Current Attention Level: Focused Memory: Decreased  recall of precautions;Decreased short-term memory Following Commands: Follows one step commands inconsistently Safety/Judgement: Decreased awareness of safety;Decreased awareness of deficits Awareness: Emergent Problem Solving: Slow processing;Decreased initiation;Difficulty sequencing;Requires verbal cues;Requires tactile cues General Comments: Patient very confused and lethargic.  Unable to answer questions and had difficulty following one step commands.  At one point began talking nonsensical words.   General Comments       Exercises      Shoulder Instructions      Home Living Family/patient expects to be discharged to:: Private residence Living Arrangements: Children Available Help at Discharge: Family Type of Home: House Home Access: Stairs to enter [employer] of Steps: 6 Entrance Stairs-Rails: Right Home Layout: Two level;Able to live on main level with bedroom/bathroom     Bathroom Shower/Tub: Tub/shower unit         Home Equipment: Walker - 2 wheels;Cane - single point;Bedside commode;Shower seat;Transport chair   Additional Comments: Pt reports daughter does the cooking, cleaning, and driving. She reports she bathes herself. Daughter reports she is ambulatory. Pt becomes confused during history taking stating "I'm not sure where my husband is." Will need to confirm history taking. Pt primarily uses RW. History of multiple falls.       Prior Functioning/Environment Level of Independence: Independent with assistive device(s)        Comments: Pt reports RW is her primary AD lately however she has a SPC.         OT Problem List: Decreased strength;Decreased range of motion;Decreased activity tolerance;Impaired balance (sitting and/or standing);Decreased cognition;Cardiopulmonary status limiting activity;Decreased safety awareness      OT Treatment/Interventions: Self-care/ADL training;Therapeutic exercise;Neuromuscular education;Energy conservation;DME and/or AE instruction;Therapeutic activities;Patient/family education;Balance training    OT Goals(Current goals can be found in the care plan section) Acute Rehab OT Goals Patient Stated Goal: Did not state OT Goal Formulation: Patient unable to participate in goal setting Time For Goal Achievement: 03/26/19 Potential to Achieve Goals: Fair  OT Frequency: Min 2X/week   Barriers to D/C:            Co-evaluation              AM-PAC OT "6 Clicks" Daily Activity     Outcome Measure Help from another person eating meals?: A  Little Help from another person taking care of personal grooming?: A Little Help from another person toileting, which includes using toliet, bedpan, or urinal?: Total Help from another person bathing (including washing, rinsing, drying)?: A Lot Help from another person to put on and taking off regular upper body clothing?: A Little Help from another person to put on and taking off regular lower body clothing?: A Lot 6 Click Score: 14   End of Session Nurse Communication: Mobility status  Activity Tolerance: Patient limited by lethargy Patient left: in bed;with call bell/phone within reach  OT Visit Diagnosis: Unsteadiness on feet (R26.81);Muscle weakness (generalized) (M62.81);Other symptoms and signs involving cognitive function                Time: 1340-1357 OT Time Calculation (min): 17 min Charges:  OT General Charges $OT Visit: 1 Visit OT Evaluation $OT Eval Moderate Complexity: 1 [address] OTR/L   Phylliss Bob 03/12/2019, 2:43 PM

## 2019-03-12 NOTE — Evaluation (Signed)
Physical Therapy Evaluation Patient Details Name: [name] MRN: [id number] DOB: 1940-05-22 Today's Date: 03/12/2019   History of Present Illness  Pt is is an 79 y.o. 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 Daughter reports jerking movements of pt's legs and odd chewing motions prior to admit. No szs found on EEG. Pt had multiple falls prior to admit and found to have toxic & metabolic encephalopathy.    Clinical Impression  Pt on room air on entry with SpO2 within 98-100% throughout tx. All other vitals WNL throughout as well. Pt appears confused at times and is disoriented to time, place, and situation. Pt is pleasant and no signs of distress. Pt requires mod to max A supine>sit and tends to lean posteriorly requiring max A to recover balance due to significant trunk weakness. Pt requires mod A x2 sit>stand and mod A stand pivot to chair with 3' ambulation taken in between with RW with mod to max A for balance. Pt tries to lean posteriorly before reaching the chair. Frequent cuing required for upright posture. Pt demonstrated decreased endurance, functional mobility, balance impairments with hx of multiple falls. D/C plans include SNF with 24 hr supervision. PT will continue to follow acutely.     Follow Up Recommendations SNF;Supervision/Assistance - 24 hour    Equipment Recommendations  None recommended by PT    Recommendations for Other Services OT consult     Precautions / Restrictions Precautions Precautions: Fall;Other (comment)(airborne) Restrictions Weight Bearing Restrictions: No      Mobility  Bed Mobility Overal bed mobility: Needs Assistance Bed Mobility: Supine to Sit     Supine to sit: Max assist;HOB elevated;Mod assist     General  bed mobility comments: Mod A initially for supine>sit when bringing legs over to EOB however pt had difficulty pushing her trunk upwards and would fall back without max Z  Transfers Overall transfer level: Needs assistance Equipment used: Rolling walker (2 wheeled) Transfers: Sit to/from [facility] Sit to Stand: +2 safety/equipment;Mod assist;From elevated surface Stand pivot transfers: Mod assist;+2 safety/equipment       General transfer comment: x2 due to pt demonstrating significant unsteadiness and weakness  Ambulation/Gait Ambulation/Gait assistance: Max assist;Mod assist;+2 safety/equipment Gait Distance (Feet): 3 Feet Assistive device: Rolling walker (2 wheeled) Gait Pattern/deviations: Step-to pattern;Decreased step length - right;Decreased step length - left;Shuffle;Leaning posteriorly;Decreased stride length Gait velocity: decr Gait velocity interpretation: <1.8 ft/sec, indicate of risk for recurrent falls General Gait Details: Pt is able to take short, slow steps with mod A however requires max A for guidance to the chair and to maintain upright posture. Pt tries to lean posteriorly before reaching safe distance from the chair.        Balance Overall balance assessment: Needs assistance Sitting-balance support: Feet supported;Bilateral upper extremity supported Sitting balance-Leahy Scale: Poor Sitting balance - Comments: Pt requires mod A initially to maintain upright trunk sitting EOB. She would lean posteriorly and/or to the R. However once situated, pt able to maintain balance with UE support independently. Postural control: Posterior lean Standing balance support: Bilateral upper extremity supported Standing balance-Leahy Scale: Poor Standing balance comment: Pt relies on UE support and requires mod A to mainting balance with static standing  Pertinent Vitals/Pain Pain Assessment: Faces Faces Pain Scale: No  hurt(Pt doesn't demonstrate pain however reports discomfort) Pain Location: Bilateral shoulders and hips Pain Descriptors / Indicators: Aching;Constant Pain Intervention(s): Limited activity within patient's tolerance;Monitored during session;Repositioned;Patient requesting pain meds-RN notified    Home Living Family/patient expects to be discharged to:: Private residence Living Arrangements: Children(daughter) Available Help at Discharge: Family Type of Home: House Home Access: Stairs to enter Entrance Stairs-Rails: Right Entrance Stairs-Number of Steps: 6 Home Layout: Two level;Able to live on main level with bedroom/bathroom Home Equipment: Gilford Rile - 2 wheels;Cane - single point;Bedside commode;Shower seat;Transport chair Additional Comments: Pt reports daughter does the cooking, cleaning, and driving. She reports she bathes herself. Daughter reports she is ambulatory. Pt becomes confused during history taking stating "I'm not sure where my husband is." Will need to confirm history taking. Pt primarily uses RW. History of multiple falls.     Prior Function Level of Independence: Independent with assistive device(s)         Comments: Pt reports RW is her primary AD lately however she has a SPC.      Hand Dominance   Dominant Hand: Right    Extremity/Trunk Assessment   Upper Extremity Assessment Upper Extremity Assessment: Overall WFL for tasks assessed    Lower Extremity Assessment Lower Extremity Assessment: Generalized weakness       Communication   Communication: No difficulties(increased processing time)  Cognition Arousal/Alertness: Awake/alert Behavior During Therapy: Flat affect Overall Cognitive Status: Impaired/Different from baseline Area of Impairment: Orientation;Following commands;Memory;Safety/judgement;Awareness;Problem solving                 Orientation Level: Place;Time;Situation;Disoriented to Current Attention Level: Sustained Memory:  Decreased recall of precautions;Decreased short-term memory Following Commands: Follows one step commands inconsistently;Follows one step commands with increased time Safety/Judgement: Decreased awareness of safety;Decreased awareness of deficits Awareness: Emergent Problem Solving: Slow processing;Decreased initiation;Difficulty sequencing;Requires verbal cues General Comments: Pt becomes confused a few times during session unsure of where her husband is. Pt able to recall her children and her home setup.       General Comments General comments (skin integrity, edema, etc.): Pt on room air on entry. SpO2 remains 98-100% throughout session. BP WNL.     PT Assessment Patient needs continued PT services  PT Problem List Decreased strength;Decreased activity tolerance;Decreased balance;Decreased mobility;Decreased coordination;Decreased cognition;Decreased knowledge of use of DME;Decreased safety awareness;Decreased knowledge of precautions;Pain       PT Treatment Interventions DME instruction;Gait training;Stair training;Functional mobility training;Therapeutic activities;Therapeutic exercise;Balance training;Neuromuscular re-education;Patient/family education    PT Goals (Current goals can be found in the Care Plan section)  Acute Rehab PT Goals Patient Stated Goal: to go home PT Goal Formulation: With patient Time For Goal Achievement: 03/26/19 Potential to Achieve Goals: Fair    Frequency Min 2X/week    AM-PAC PT "6 Clicks" Mobility  Outcome Measure Help needed turning from your back to your side while in a flat bed without using bedrails?: Total Help needed moving from lying on your back to sitting on the side of a flat bed without using bedrails?: Total Help needed moving to and from a bed to a chair (including a wheelchair)?: A Lot Help needed standing up from a chair using your arms (e.g., wheelchair or bedside chair)?: A Lot Help needed to walk in hospital room?: Total Help  needed climbing 3-5 steps with a railing? : Total 6 Click Score: 8    End of Session Equipment Utilized During Treatment: Gait belt Activity Tolerance: Patient limited  by fatigue Patient left: in chair;with call bell/phone within reach;with chair alarm set Nurse Communication: Mobility status;Other (comment)(requesting pain medication) PT Visit Diagnosis: Unsteadiness on feet (R26.81);Other abnormalities of gait and mobility (R26.89);Muscle weakness (generalized) (M62.81);History of falling (Z91.81)    Time: 7939-0300 PT Time Calculation (min) (ACUTE ONLY): 47 min   Charges:   PT Evaluation $PT Eval Moderate Complexity: 1 Mod PT Treatments $Therapeutic Activity: 23-37 mins        Jodelle Green, PT, DPT Acute Rehabilitation Services Office [phone]  Jodelle Green 03/12/2019, 10:32 AM

## 2019-03-12 NOTE — Progress Notes (Signed)
PROGRESS NOTE                                                                                                                                                                                                             Patient Demographics:    [name], is a 79 y.o. female, DOB - 22-Nov-1940, AGT:[id number]  Outpatient Primary MD for the patient is Jonathon Jordan, MD    LOS - 2  Admit date - 03/09/2019    Chief Complaint  Patient presents with  . Altered Mental Status       Brief Narrative   [name] is a 79 y.o. female with PMH hypothyroidism, hx of craniectomy(for frontal temporary brain meningioma resection), CKD 4, rheumatoid arthritis, DM chronic anemia, chronic pain, recent COVID infection for which she was treated to [facility] and discharged few weeks ago, aortic stenosis.  Who lives at home and lately was having some diarrhea and generalized weakness, had multiple falls at home around 3-4 and thereafter became increasingly confused.  Was brought to the ER where she was found to have low-grade fever, had toxic and metabolic encephalopathy, dehydration, neurology was consulted.  She was started on empiric treatment for meningitis and admitted to the hospital.   Subjective:   Patient in bed appears to be in no distress denies any headache, no chest or abdominal pain, no shortness of breath no focal weakness.   Assessment  & Plan :    1.  Toxic and metabolic encephalopathy.  Multiple falls at home, reported headache prior to admission.  Low-grade fever.  Seen by neurology, head CT unremarkable, MRI brain and LP unremarkable, EEG suspicious for epileptiform focus hence she has been placed on Keppra.  Mentation has improved.  Further work-up suggestive of possible colitis see below  2.  Recent COVID-19 pneumonia.  Fully treated.  She is out of quarantine period.   SpO2: 98 %  Recent Labs  Lab  03/10/19 0923 03/11/19 0747 03/12/19 0245  CRP 25.7* 30.1* 24.9*  DDIMER 11.89* 14.23* 17.05*  BNP 1,628.4* 347.6* 182.1*    Hepatic Function Latest Ref Rng & Units 03/12/2019 03/11/2019 02/27/2019  Total Protein 6.5 - 8.1 g/dL 4.9(L) 5.3(L) -  Albumin 3.5 - 5.0 g/dL 1.4(L) 1.6(L) 2.8(L)  AST 15 - 41 U/L 13(L) 16 -  ALT 0 - 44 U/L  11 12 -  Alk Phosphatase 38 - 126 U/L 94 105 -  Total Bilirubin 0.3 - 1.2 mg/dL 0.8 0.7 -    3.  CKD 4.  Baseline creatinine around 2.6.  Gently hydrate, Foley as she has urinary retention.  Outpatient nephrology follow-up.  4.  Urinary retention.  Flomax and Foley.  5.  Hypothyroidism.  Check TSH and cortisol, IV Synthroid.  Lab Results  Component Value Date   TSH 0.857 03/10/2019    6.  History of meningioma with craniotomy in the past.  Supportive care.  Head CT nonacute.  7.  Dyslipidemia.  Once taking oral medications consistently will start home dose statin.  8.  History of moderate to severe AS.  Supportive care.  No acute issues.  Will require outpatient cardiology follow-up.  Could be a TAVR candidate.  9.  Elevated D-dimer with history of DVT.  Getting a lower extremity venous duplex, on high-dose heparin.  10.  Patient and family do not take blood transfusions.  11. Hyperkalemia and Hypomagnesemia - treated.  12.  CT evidence of severe colitis.  History of diverticulitis in the past with fistula repair done at [facility] several years ago.  She did have diarrhea prior to hospital admission, no diarrhea reported in the last 48 hours, currently placed on Rocephin and Flagyl.  Soft diet.  Monitor.  If any worse will involve GI, if any diarrhea will rule out C. difficile.  Monitor with supportive care.  Surprisingly patient has no abdominal pain or discomfort.     Condition - Extremely Guarded  Family Communication  : Daughter updated in detail on 03/10/2019, updated again on 03/11/2019, 03/12/2019  Code Status : No intubation  Diet :    Diet  Order            DIET SOFT Room service appropriate? Yes; Fluid consistency: Nectar Thick  Diet effective now               Disposition Plan  : Patient in PCU getting worked up for encephalopathy, sepsis now appears to be secondary to severe colitis.  Getting treatment for colitis.  Consults  : Neurology  Procedures  :    CT head.  Nonacute.  EEG.  Final - This study showed evidence of epileptogenicity and cortical dysfunction in right frontocentral region. No seizures were seen throughout the recording.  Lower extremity venous duplex.  No DVT.  LP - 1 wbc  CT abdomen pelvis noncontrast- 1. Findings are concerning for severe colitis, as above. 2. Findings in the lungs which likely reflect evolving post infectious or inflammatory scarring given the patient's history of recent COVID-19 infection. 3. Aortic atherosclerosis, in addition to left main and 3 vessel coronary artery disease. 4. There are calcifications of the aortic valve. Echocardiographic correlation for evaluation of potential valvular dysfunction may be warranted if clinically indicated. 5. Additional incidental findings, as above.  MRI brain.  Nonacute.    PUD Prophylaxis : none  DVT Prophylaxis  :    Heparin   Lab Results  Component Value Date   PLT 298 03/12/2019    Inpatient Medications  Scheduled Meds: . heparin  7,500 Units Subcutaneous Q8H  . insulin aspart  0-9 Units Subcutaneous TID WC  . levETIRAcetam  500 mg Oral BID  . [START ON 03/13/2019] levothyroxine  150 mcg Oral Q0600  . tamsulosin  0.4 mg Oral Daily   Continuous Infusions: . cefTRIAXone (ROCEPHIN)  IV    . lactated ringers 75 mL/hr  at 03/11/19 1549  . metronidazole     PRN Meds:.acetaminophen, albuterol  Antibiotics  :    Anti-infectives (From admission, onward)   Start     Dose/Rate Route Frequency Ordered Stop   03/12/19 1100  metroNIDAZOLE (FLAGYL) IVPB 500 mg     500 mg 100 mL/hr over 60 Minutes Intravenous Every 8 hours  03/12/19 1048     03/12/19 1100  cefTRIAXone (ROCEPHIN) 1 g in sodium chloride 0.9 % 100 mL IVPB     1 g 200 mL/hr over 30 Minutes Intravenous Every 24 hours 03/12/19 1049 03/15/19 1059   03/12/19 1033  vancomycin variable dose per unstable renal function (pharmacist dosing)  Status:  Discontinued      Does not apply See admin instructions 03/12/19 1033 03/12/19 1048   03/11/19 2300  meropenem (MERREM) 1 g in sodium chloride 0.9 % 100 mL IVPB  Status:  Discontinued     1 g 200 mL/hr over 30 Minutes Intravenous Every 12 hours 03/11/19 2217 03/12/19 1048   03/11/19 1130  vancomycin (VANCOCIN) IVPB 1000 mg/200 mL premix  Status:  Discontinued     1,000 mg 200 mL/hr over 60 Minutes Intravenous Every 48 hours 03/11/19 1032 03/12/19 1033   03/11/19 1130  fluconazole (DIFLUCAN) IVPB 100 mg     100 mg 50 mL/hr over 60 Minutes Intravenous  Once 03/11/19 1035 03/12/19 0943   03/11/19 0600  acyclovir (ZOVIRAX) 800 mg in dextrose 5 % 150 mL IVPB  Status:  Discontinued     800 mg 166 mL/hr over 60 Minutes Intravenous Every 24 hours 03/10/19 1234 03/11/19 1715   03/10/19 1600  meropenem (MERREM) 1 g in sodium chloride 0.9 % 100 mL IVPB  Status:  Discontinued     1 g 200 mL/hr over 30 Minutes Intravenous Every 12 hours 03/10/19 1247 03/11/19 2217   03/10/19 0032  vancomycin variable dose per unstable renal function (pharmacist dosing)  Status:  Discontinued      Does not apply See admin instructions 03/10/19 0032 03/11/19 1032   03/09/19 2330  acyclovir (ZOVIRAX) 800 mg in dextrose 5 % 150 mL IVPB  Status:  Discontinued     800 mg 166 mL/hr over 60 Minutes Intravenous Every 8 hours 03/09/19 2321 03/10/19 1234   03/09/19 2330  meropenem (MERREM) 1 g in sodium chloride 0.9 % 100 mL IVPB  Status:  Discontinued     1 g 200 mL/hr over 30 Minutes Intravenous Every 8 hours 03/09/19 2324 03/10/19 1247   03/09/19 2330  vancomycin (VANCOREADY) IVPB 1500 mg/300 mL     1,500 mg 150 mL/hr over 120 Minutes  Intravenous  Once 03/09/19 2324 03/10/19 0601       Time Spent in minutes  30   Lala Lund M.D on 03/12/2019 at 10:54 AM  To page go to [email or web address] - password [email or web address]  [facility] -  Office  [phone]    See all Orders from today for further details    Objective:   Vitals:   03/10/19 1945 03/10/19 2000 03/11/19 0800 03/12/19 0600  BP:  (!) 146/58 (!) 121/59 (!) 147/45  Pulse:  (!) 107 (!) 107 (!) 113  Resp: 19 19 (!) 21 15  Temp:  97.6 F (36.4 C)  98 F (36.7 C)  TempSrc:  Axillary  Axillary  SpO2:  94% 96% 98%  Weight:      Height:        Wt Readings from Last 3 Encounters:  03/10/19 80.9 kg  02/27/19 88 kg  01/12/19 82.1 kg     Intake/Output Summary (Last 24 hours) at 03/12/2019 1054 Last data filed at 03/11/2019 1858 Gross per 24 hour  Intake --  Output 700 ml  Net -700 ml     Physical Exam  Patient in bed, more awake today, follow basic commands, she still thinks that she is in [place] but orients well after getting a few prompts.  Moving all 4 extremities to commands [place].AT,PERRAL Supple Neck,No JVD, No cervical lymphadenopathy appriciated.  Symmetrical Chest wall movement, Good air movement bilaterally, CTAB RRR,No Gallops, Rubs or new Murmurs, No Parasternal Heave +ve B.Sounds, Abd Soft, No tenderness, No organomegaly appriciated, No rebound - guarding or rigidity. No Cyanosis, Clubbing or edema, No new Rash or bruise    Data Review:    CBC Recent Labs  Lab 03/09/19 1944 03/11/19 0747 03/12/19 0245  WBC 8.2 13.6* 11.8*  HGB 7.8* 8.6* 7.5*  HCT 25.2* 26.7* 23.2*  PLT 210 288 298  MCV 94.4 90.2 88.9  MCH 29.2 29.1 28.7  MCHC 31.0 32.2 32.3  RDW 18.3* 18.2* 18.2*  LYMPHSABS 1.7 1.3 1.2  MONOABS 0.5 0.4 0.5  EOSABS 0.4 0.2 0.5  BASOSABS 0.0 0.1 0.0    Chemistries  Recent Labs  Lab 03/09/19 1944 03/10/19 0923 03/10/19 1846 03/11/19 0747 03/12/19 0245  NA 137 139  --  143 143  K 4.8 6.0* 4.7 4.1 3.7  CL 108 112*  --   112* 112*  CO2 18* 13*  --  18* 21*  GLUCOSE 143* 98  --  161* 127*  BUN 19 19  --  23 27*  CREATININE 3.00* 2.78*  --  2.94* 2.94*  CALCIUM 7.8* 7.8*  --  8.1* 8.2*  MG  --  1.4*  --  2.0 2.0  AST  --   --   --  16 13*  ALT  --   --   --  12 11  ALKPHOS  --   --   --  105 94  BILITOT  --   --   --  0.7 0.8   ------------------------------------------------------------------------------------------------------------------ No results for input(s): CHOL, HDL, LDLCALC, TRIG, CHOLHDL, LDLDIRECT in the last 72 hours.  Lab Results  Component Value Date   HGBA1C 6.6 (H) 02/15/2019   ------------------------------------------------------------------------------------------------------------------ Recent Labs    03/10/19 1846  TSH 0.857    Cardiac Enzymes No results for input(s): CKMB, TROPONINI, MYOGLOBIN in the last 168 hours.  Invalid input(s): CK ------------------------------------------------------------------------------------------------------------------    Component Value Date/Time   BNP 182.1 (H) 03/12/2019 0245    Micro Results Recent Results (from the past 240 hour(s))  Blood Culture (routine x 2)     Status: None (Preliminary result)   Collection Time: 03/09/19  7:44 PM   Specimen: BLOOD  Result Value Ref Range Status   Specimen Description BLOOD RIGHT ANTECUBITAL  Final   Special Requests   Final    BOTTLES DRAWN AEROBIC AND ANAEROBIC Blood Culture results may not be optimal due to an inadequate volume of blood received in culture bottles   Culture   Final    NO GROWTH 2 DAYS Performed at [facility], [place] [address], [place], [place] [address]    Report Status PENDING  Incomplete  Blood Culture (routine x 2)     Status: None (Preliminary result)   Collection Time: 03/09/19  7:49 PM   Specimen: BLOOD  Result Value Ref Range Status   Specimen Description BLOOD  LEFT ANTECUBITAL  Final   Special Requests   Final    BOTTLES DRAWN AEROBIC AND ANAEROBIC  Blood Culture results may not be optimal due to an inadequate volume of blood received in culture bottles   Culture   Final    NO GROWTH 2 DAYS Performed at [facility], [place] [address], [place], [place] [address]    Report Status PENDING  Incomplete  Urine culture     Status: Abnormal   Collection Time: 03/09/19  8:00 PM   Specimen: In/Out Cath Urine  Result Value Ref Range Status   Specimen Description IN/OUT CATH URINE  Final   Special Requests   Final    NONE Performed at [facility], [place] [address], [place], [place] [address]    Culture >=100,000 COLONIES/mL YEAST (A)  Final   Report Status 03/10/2019 FINAL  Final  CSF culture     Status: None (Preliminary result)   Collection Time: 03/11/19  2:48 PM   Specimen: PATH Cytology CSF; Cerebrospinal Fluid  Result Value Ref Range Status   Specimen Description CSF  Final   Special Requests NONE  Final   Gram Stain   Final    WBC PRESENT,BOTH PMN AND MONONUCLEAR NO ORGANISMS SEEN CYTOSPIN SMEAR    Culture   Final    NO GROWTH < 24 HOURS Performed at [facility], [place] [address] [place], [place] [address]    Report Status PENDING  Incomplete    Radiology Reports CT ABDOMEN PELVIS WO CONTRAST  Result Date: 03/12/2019 CLINICAL DATA:  79 year old female with history of COVID-19 pneumonia. Generalized weakness and diarrhea. EXAM: CT CHEST, ABDOMEN AND PELVIS WITHOUT CONTRAST TECHNIQUE: Multidetector CT imaging of the chest, abdomen and pelvis was performed following the standard protocol without IV contrast. COMPARISON:  Chest CT 08/05/2016.  CT the abdomen 01/27/2006. FINDINGS: CT CHEST FINDINGS Cardiovascular: Heart size is normal. There is no significant pericardial fluid, thickening or pericardial calcification. There is aortic atherosclerosis, as well as atherosclerosis of the great vessels of the mediastinum and the coronary arteries, including calcified atherosclerotic plaque in the left main, left anterior  descending, left circumflex and right coronary arteries. Severe calcifications of the aortic valve. Mediastinum/Nodes: No pathologically enlarged mediastinal or hilar lymph nodes. Please note that accurate exclusion of hilar adenopathy is limited on noncontrast CT scans. Esophagus is unremarkable in appearance. No axillary lymphadenopathy. Lungs/Pleura: Patchy areas of ground-glass attenuation, septal thickening, thickening of the peribronchovascular interstitium and regional architectural distortion in the mid to lower lungs bilaterally, likely to reflect mild post infectious or inflammatory scarring in this patient with history of recent COVID-19 infection. Trace bilateral pleural effusions lying dependently. No suspicious appearing pulmonary nodules or masses are noted. Musculoskeletal: There are no aggressive appearing lytic or blastic lesions noted in the visualized portions of the skeleton. CT ABDOMEN PELVIS FINDINGS Hepatobiliary: 1.4 cm low-attenuation lesion in segment 4A of the liver, incompletely characterized on today's non-contrast CT examination, but similar to the prior study and most compatible with a cyst. No other suspicious hepatic lesions. Unenhanced appearance of the gallbladder is unremarkable. Pancreas: No definite pancreatic mass or peripancreatic fluid collections or inflammatory changes are noted on today's noncontrast CT examination. Spleen: Unremarkable. Adrenals/Urinary Tract: Exophytic 2.4 cm low-attenuation lesion in the lower pole of the left kidney, incompletely characterized on today's noncontrast CT examination but statistically likely to represent a cyst. Unenhanced appearance of the right kidney and bilateral adrenal glands are normal in appearance. No hydroureteronephrosis.  Urinary bladder is nearly decompressed with an indwelling Foley balloon catheter. Small amount of gas in the non dependent portion of the urinary bladder is iatrogenic. Stomach/Bowel: Unenhanced appearance of  the stomach is normal. No pathologic dilatation of small bowel or colon. Suture line near the rectosigmoid junction from prior partial bowel resection. Adjacent haziness and soft tissue stranding in the presacral soft tissues may be related to prior radiation therapy. Extensive mural thickening throughout the colon most severe from the transverse colon to the proximal sigmoid colon where there are surrounding inflammatory changes, compatible with a colitis. Vascular/Lymphatic: Aortic atherosclerosis. No lymphadenopathy noted in the abdomen or pelvis. Reproductive: Status post hysterectomy. Ovaries are not confidently identified may be surgically absent or atrophic. Other: No significant volume of ascites.  No pneumoperitoneum. Musculoskeletal: There are no aggressive appearing lytic or blastic lesions noted in the visualized portions of the skeleton. IMPRESSION: 1. Findings are concerning for severe colitis, as above. 2. Findings in the lungs which likely reflect evolving post infectious or inflammatory scarring given the patient's history of recent COVID-19 infection. 3. Aortic atherosclerosis, in addition to left main and 3 vessel coronary artery disease. 4. There are calcifications of the aortic valve. Echocardiographic correlation for evaluation of potential valvular dysfunction may be warranted if clinically indicated. 5. Additional incidental findings, as above. Electronically Signed   By: Vinnie Langton M.D.   On: 03/12/2019 09:49   CT Head Wo Contrast  Result Date: 03/09/2019 CLINICAL DATA:  Altered mental status and increasing confusion, history of COVID-19 positivity EXAM: CT HEAD WITHOUT CONTRAST TECHNIQUE: Contiguous axial images were obtained from the base of the skull through the vertex without intravenous contrast. COMPARISON:  02/10/2018 FINDINGS: Brain: Mild atrophic changes and chronic white matter ischemic changes are seen stable from the prior exam. No findings to suggest acute hemorrhage,  acute infarction or space-occupying mass lesion are noted. Vascular: No hyperdense vessel or unexpected calcification. Skull: Changes consistent with prior craniotomy are noted in right temporoparietal region Sinuses/Orbits: No acute finding. Other: None. IMPRESSION: Chronic atrophic and ischemic changes without acute abnormality. Electronically Signed   By: Inez Catalina M.D.   On: 03/09/2019 21:10   CT CHEST WO CONTRAST  Result Date: 03/12/2019 CLINICAL DATA:  79 year old female with history of COVID-19 pneumonia. Generalized weakness and diarrhea. EXAM: CT CHEST, ABDOMEN AND PELVIS WITHOUT CONTRAST TECHNIQUE: Multidetector CT imaging of the chest, abdomen and pelvis was performed following the standard protocol without IV contrast. COMPARISON:  Chest CT 08/05/2016.  CT the abdomen 01/27/2006. FINDINGS: CT CHEST FINDINGS Cardiovascular: Heart size is normal. There is no significant pericardial fluid, thickening or pericardial calcification. There is aortic atherosclerosis, as well as atherosclerosis of the great vessels of the mediastinum and the coronary arteries, including calcified atherosclerotic plaque in the left main, left anterior descending, left circumflex and right coronary arteries. Severe calcifications of the aortic valve. Mediastinum/Nodes: No pathologically enlarged mediastinal or hilar lymph nodes. Please note that accurate exclusion of hilar adenopathy is limited on noncontrast CT scans. Esophagus is unremarkable in appearance. No axillary lymphadenopathy. Lungs/Pleura: Patchy areas of ground-glass attenuation, septal thickening, thickening of the peribronchovascular interstitium and regional architectural distortion in the mid to lower lungs bilaterally, likely to reflect mild post infectious or inflammatory scarring in this patient with history of recent COVID-19 infection. Trace bilateral pleural effusions lying dependently. No suspicious appearing pulmonary nodules or masses are noted.  Musculoskeletal: There are no aggressive appearing lytic or blastic lesions noted in the visualized portions of the skeleton. CT  ABDOMEN PELVIS FINDINGS Hepatobiliary: 1.4 cm low-attenuation lesion in segment 4A of the liver, incompletely characterized on today's non-contrast CT examination, but similar to the prior study and most compatible with a cyst. No other suspicious hepatic lesions. Unenhanced appearance of the gallbladder is unremarkable. Pancreas: No definite pancreatic mass or peripancreatic fluid collections or inflammatory changes are noted on today's noncontrast CT examination. Spleen: Unremarkable. Adrenals/Urinary Tract: Exophytic 2.4 cm low-attenuation lesion in the lower pole of the left kidney, incompletely characterized on today's noncontrast CT examination but statistically likely to represent a cyst. Unenhanced appearance of the right kidney and bilateral adrenal glands are normal in appearance. No hydroureteronephrosis. Urinary bladder is nearly decompressed with an indwelling Foley balloon catheter. Small amount of gas in the non dependent portion of the urinary bladder is iatrogenic. Stomach/Bowel: Unenhanced appearance of the stomach is normal. No pathologic dilatation of small bowel or colon. Suture line near the rectosigmoid junction from prior partial bowel resection. Adjacent haziness and soft tissue stranding in the presacral soft tissues may be related to prior radiation therapy. Extensive mural thickening throughout the colon most severe from the transverse colon to the proximal sigmoid colon where there are surrounding inflammatory changes, compatible with a colitis. Vascular/Lymphatic: Aortic atherosclerosis. No lymphadenopathy noted in the abdomen or pelvis. Reproductive: Status post hysterectomy. Ovaries are not confidently identified may be surgically absent or atrophic. Other: No significant volume of ascites.  No pneumoperitoneum. Musculoskeletal: There are no aggressive  appearing lytic or blastic lesions noted in the visualized portions of the skeleton. IMPRESSION: 1. Findings are concerning for severe colitis, as above. 2. Findings in the lungs which likely reflect evolving post infectious or inflammatory scarring given the patient's history of recent COVID-19 infection. 3. Aortic atherosclerosis, in addition to left main and 3 vessel coronary artery disease. 4. There are calcifications of the aortic valve. Echocardiographic correlation for evaluation of potential valvular dysfunction may be warranted if clinically indicated. 5. Additional incidental findings, as above. Electronically Signed   By: Vinnie Langton M.D.   On: 03/12/2019 09:49   MR BRAIN WO CONTRAST  Result Date: 03/11/2019 CLINICAL DATA:  Encephalopathy, history of meningioma resection EXAM: MRI HEAD WITHOUT CONTRAST TECHNIQUE: Multiplanar, multiecho pulse sequences of the brain and surrounding structures were obtained without intravenous contrast. COMPARISON:  2018 FINDINGS: Motion artifact is present. Brain: There is no acute infarction or intracranial hemorrhage. There is no intracranial mass, mass effect, or edema. There is no hydrocephalus or extra-axial fluid collection. Patchy T2 hyperintensity in the supratentorial white matter is nonspecific but probably reflects stable chronic microvascular ischemic changes. Vascular: Major vessel flow voids at the skull base are preserved. Skull and upper cervical spine: Right frontal craniotomy. Normal marrow signal is preserved. Sinuses/Orbits: Paranasal sinuses are aerated. No significant orbital abnormality. Other: Sella is unremarkable.  Mastoid air cells are clear. IMPRESSION: Motion degraded. No acute infarction or other acute abnormality. Stable chronic microvascular ischemic changes. Electronically Signed   By: Macy Mis M.D.   On: 03/11/2019 13:03   US RENAL  Result Date: 02/16/2019 CLINICAL DATA:  Acute kidney injury. EXAM: RENAL / URINARY TRACT  ULTRASOUND COMPLETE COMPARISON:  None. FINDINGS: Right Kidney: Renal measurements: 8.7 x 5.3 x 5.2 cm = volume: 123 mL. 1.6 cm cyst is noted in upper pole. Increased echogenicity of renal parenchyma is noted. No mass or hydronephrosis visualized. Left Kidney: Renal measurements: 10.4 x 5.8 x 5.8 cm = volume: 183 mL. 2.1 cm cyst is seen in lower pole. Increased echogenicity of renal  parenchyma is noted. No mass or hydronephrosis visualized. Bladder: Appears normal for degree of bladder distention. Other: None. IMPRESSION: Increased echogenicity of renal parenchyma is noted bilaterally consistent with medical renal disease. No hydronephrosis or renal obstruction is noted. Electronically Signed   By: Marijo Conception M.D.   On: 02/16/2019 09:43   DG Chest Portable 1 View  Result Date: 03/09/2019 CLINICAL DATA:  Altered mental status. Possible pulmonary infection. EXAM: PORTABLE CHEST 1 VIEW COMPARISON:  February 14, 2019. FINDINGS: Stable mild cardiomegaly. Central pulmonary vascular congestion without overt pulmonary edema or obvious pleural effusion. Low lung volumes. Linear atelectasis in the left lower lung zone. No pneumonia. Moderate skeletal degenerative change and telemetry leads. IMPRESSION: Stable mild cardiomegaly with central pulmonary vascular congestion. No pneumonia or pulmonary edema. Electronically Signed   By: Revonda Humphrey   On: 03/09/2019 21:41   DG Chest Port 1 View  Result Date: 02/14/2019 CLINICAL DATA:  Sepsis EXAM: PORTABLE CHEST 1 VIEW COMPARISON:  02/10/2018 FINDINGS: Heart and mediastinal contours are within normal limits. No focal opacities or effusions. No acute bony abnormality. IMPRESSION: No active disease. Electronically Signed   By: Rolm Baptise M.D.   On: 02/14/2019 18:29   Overnight EEG with video  Result Date: 03/10/2019 Lora Havens, MD     03/11/2019  9:03 AM Patient Name: [name] MRN: [id number] Epilepsy Attending: Lora Havens Referring  Physician/Provider: Dr Roland Rack Duration: 03/09/2019 2308 to 03/10/2019 2308 Patient history: 79 year old female presenting with polymyoclonus, fever of unclear etiology and altered mental status. EEG to evaluate for seizure. Level of alertness: awake/lethargic AEDs during EEG study: None Technical aspects: This EEG study was done with scalp electrodes positioned according to the 10-20 International system of electrode placement. Electrical activity was acquired at a sampling rate of '500Hz'$  and reviewed with a high frequency filter of '70Hz'$  and a low frequency filter of '1Hz'$ . EEG data were recorded continuously and digitally stored. DESCRIPTION: No clear posterior dominant rhythm was seen. EEG showed continuous generalized and maximal right frontocentral region. Sharp waves were also seen in right frontocentral region. Hyperventilation and photic stimulation were not performed. Event button was pressed on 03/10/2019 at 1016 for unclear reason. Cocomitant eeg before, during and after the event didn't show any eeg change to suggest seizure. 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 No seizures were seen throughout the recording. Event button was pressed on 03/10/2019 at 1016 for unclear reason. Cocomitant eeg before, during and after the event didn't show any eeg change to suggest seizure. Priyanka O Yadav   VAS [place] LOWER EXTREMITY VENOUS (DVT)  Result Date: 03/10/2019  Lower Venous DVTStudy Indications: Elevated Ddimer.  Risk Factors: COVID 19 positive. Comparison Study: No prior studies. Performing Technologist: Oliver Hum RVT  Examination Guidelines: A complete evaluation includes B-mode imaging, spectral Doppler, color Doppler, and power Doppler as needed of all accessible portions of each vessel. Bilateral testing is  considered an integral part of a complete examination. Limited examinations for reoccurring indications may be performed as noted. The reflux portion of the exam is performed with the patient in reverse Trendelenburg.  +---------+---------------+---------+-----------+----------+--------------+ RIGHT    CompressibilityPhasicitySpontaneityPropertiesThrombus Aging +---------+---------------+---------+-----------+----------+--------------+ CFV      Full           Yes      Yes                                 +---------+---------------+---------+-----------+----------+--------------+  SFJ      Full                                                        +---------+---------------+---------+-----------+----------+--------------+ FV Prox  Full                                                        +---------+---------------+---------+-----------+----------+--------------+ FV Mid   Full                                                        +---------+---------------+---------+-----------+----------+--------------+ FV DistalFull                                                        +---------+---------------+---------+-----------+----------+--------------+ PFV      Full                                                        +---------+---------------+---------+-----------+----------+--------------+ POP      Full           Yes      Yes                                 +---------+---------------+---------+-----------+----------+--------------+ PTV      Full                                                        +---------+---------------+---------+-----------+----------+--------------+ PERO     Full                                                        +---------+---------------+---------+-----------+----------+--------------+   +---------+---------------+---------+-----------+----------+--------------+ LEFT      CompressibilityPhasicitySpontaneityPropertiesThrombus Aging +---------+---------------+---------+-----------+----------+--------------+ CFV      Full           Yes      Yes                                 +---------+---------------+---------+-----------+----------+--------------+ SFJ      Full                                                        +---------+---------------+---------+-----------+----------+--------------+  FV Prox  Full                                                        +---------+---------------+---------+-----------+----------+--------------+ FV Mid   Full                                                        +---------+---------------+---------+-----------+----------+--------------+ FV Distal               Yes      Yes                                 +---------+---------------+---------+-----------+----------+--------------+ PFV      Full                                                        +---------+---------------+---------+-----------+----------+--------------+ POP      Full           Yes      Yes                                 +---------+---------------+---------+-----------+----------+--------------+ PTV      Full                                                        +---------+---------------+---------+-----------+----------+--------------+ PERO     Full                                                        +---------+---------------+---------+-----------+----------+--------------+     Summary: RIGHT: - There is no evidence of deep vein thrombosis in the lower extremity. However, portions of this examination were limited- see technologist comments above.  - No cystic structure found in the popliteal fossa.  LEFT: - There is no evidence of deep vein thrombosis in the lower extremity. However, portions of this examination were limited- see technologist comments above.  - No cystic structure found in the popliteal  fossa.  *See table(s) above for measurements and observations. Electronically signed by Servando Snare MD on 03/10/2019 at 4:08:34 PM.    Final    VAS [place] LOWER EXTREMITY VENOUS (DVT)  Result Date: 02/22/2019  Lower Venous DVTStudy Indications: Hx of DVT.  Limitations: Body habitus and sunlight in the room, uncooperative patient. Performing Technologist: Antonieta Pert RDMS, RVT  Examination Guidelines: A complete evaluation includes B-mode imaging, spectral Doppler, color Doppler, and power Doppler as needed of all accessible portions of each vessel. Bilateral testing is considered an integral part of a complete examination. Limited examinations for reoccurring  indications may be performed as noted. The reflux portion of the exam is performed with the patient in reverse Trendelenburg.  +---------+---------------+---------+-----------+----------+-------------------+ RIGHT    CompressibilityPhasicitySpontaneityPropertiesThrombus Aging      +---------+---------------+---------+-----------+----------+-------------------+ CFV      Full           Yes      Yes                                      +---------+---------------+---------+-----------+----------+-------------------+ SFJ      Full                                                             +---------+---------------+---------+-----------+----------+-------------------+ FV Prox  Full                                                             +---------+---------------+---------+-----------+----------+-------------------+ FV Mid   Full                                                             +---------+---------------+---------+-----------+----------+-------------------+ FV DistalFull                                                             +---------+---------------+---------+-----------+----------+-------------------+ PFV      Full                                                              +---------+---------------+---------+-----------+----------+-------------------+ POP                                                   Not visualized      +---------+---------------+---------+-----------+----------+-------------------+ PTV                                                   poor visualization                                                        due to pt  cooperation and                                                           tolerance.          +---------+---------------+---------+-----------+----------+-------------------+ PERO                                                  poor visualization                                                        due to pt                                                                 cooperation and                                                           tolerance.          +---------+---------------+---------+-----------+----------+-------------------+ GSV      Full                                                             +---------+---------------+---------+-----------+----------+-------------------+   +---------+---------------+---------+-----------+----------+-------------------+ LEFT     CompressibilityPhasicitySpontaneityPropertiesThrombus Aging      +---------+---------------+---------+-----------+----------+-------------------+ CFV      Full           Yes      Yes                                      +---------+---------------+---------+-----------+----------+-------------------+ SFJ      Full                                                             +---------+---------------+---------+-----------+----------+-------------------+ FV Prox  Full                                                             +---------+---------------+---------+-----------+----------+-------------------+ FV  Mid  Full                                                             +---------+---------------+---------+-----------+----------+-------------------+ FV Distal                                             Not visualized      +---------+---------------+---------+-----------+----------+-------------------+ PFV      Full                                                             +---------+---------------+---------+-----------+----------+-------------------+ POP                     Yes      Yes                  unable to compress                                                        due to pt                                                                 cooperation and                                                           tolerance.          +---------+---------------+---------+-----------+----------+-------------------+ PTV                                                   not well visualized +---------+---------------+---------+-----------+----------+-------------------+ PERO                                                  not well visualized +---------+---------------+---------+-----------+----------+-------------------+ GSV      Full                                                             +---------+---------------+---------+-----------+----------+-------------------+  Summary: RIGHT: - There is no evidence of deep vein thrombosis in the lower extremity. However, portions of this examination were limited- see technologist comments above.  - A cystic structure is found in the popliteal fossa.  LEFT: - There is no evidence of deep vein thrombosis in the lower extremity. However, portions of this examination were limited- see technologist comments above.  - No cystic structure found in the popliteal fossa.  *See table(s) above for measurements and observations. Electronically signed by Deitra Mayo MD on 02/22/2019 at 4:12:36  PM.    Final    VAS [place] LOWER EXTREMITY VENOUS (DVT)  Result Date: 02/17/2019  Lower Venous DVTStudy Indications: Elevated ddimer.  Limitations: Body habitus, poor ultrasound/tissue interface and pain tolerance. Comparison Study: 02/10/18 previous Performing Technologist: Abram Sander RVS  Examination Guidelines: A complete evaluation includes B-mode imaging, spectral Doppler, color Doppler, and power Doppler as needed of all accessible portions of each vessel. Bilateral testing is considered an integral part of a complete examination. Limited examinations for reoccurring indications may be performed as noted. The reflux portion of the exam is performed with the patient in reverse Trendelenburg.  +---------+---------------+---------+-----------+----------+--------------+ RIGHT    CompressibilityPhasicitySpontaneityPropertiesThrombus Aging +---------+---------------+---------+-----------+----------+--------------+ CFV      Full           Yes      Yes                                 +---------+---------------+---------+-----------+----------+--------------+ SFJ      Full                                                        +---------+---------------+---------+-----------+----------+--------------+ FV Prox  Full                                                        +---------+---------------+---------+-----------+----------+--------------+ FV Mid   Full                                                        +---------+---------------+---------+-----------+----------+--------------+ FV Distal               Yes      Yes                                 +---------+---------------+---------+-----------+----------+--------------+ PFV      Full                                                        +---------+---------------+---------+-----------+----------+--------------+ POP      Full           Yes      Yes                                  +---------+---------------+---------+-----------+----------+--------------+  PTV      Full                                                        +---------+---------------+---------+-----------+----------+--------------+ PERO                                                  Not visualized +---------+---------------+---------+-----------+----------+--------------+   +---------+---------------+---------+-----------+----------+--------------+ LEFT     CompressibilityPhasicitySpontaneityPropertiesThrombus Aging +---------+---------------+---------+-----------+----------+--------------+ CFV      Full           Yes      Yes                                 +---------+---------------+---------+-----------+----------+--------------+ SFJ      Full                                                        +---------+---------------+---------+-----------+----------+--------------+ FV Prox  Full                                                        +---------+---------------+---------+-----------+----------+--------------+ FV Mid   Full                                                        +---------+---------------+---------+-----------+----------+--------------+ FV Distal               Yes      Yes                                 +---------+---------------+---------+-----------+----------+--------------+ PFV      Full                                                        +---------+---------------+---------+-----------+----------+--------------+ POP      Full           Yes      Yes                                 +---------+---------------+---------+-----------+----------+--------------+ PTV      Full                                                        +---------+---------------+---------+-----------+----------+--------------+  PERO                                                  Not visualized  +---------+---------------+---------+-----------+----------+--------------+     Summary: BILATERAL: - No evidence of deep vein thrombosis seen in the lower extremities, bilaterally.   *See table(s) above for measurements and observations. Electronically signed by Harold Barban MD on 02/17/2019 at 7:28:13 AM.    Final    DG FL GUIDED LUMBAR PUNCTURE  Result Date: 03/11/2019 CLINICAL DATA:  Mental status changes.  COVID-19 positive. EXAM: DIAGNOSTIC LUMBAR PUNCTURE UNDER FLUOROSCOPIC GUIDANCE FLUOROSCOPY TIME:  Fluoroscopy Time:  1 minutes and 24 seconds Radiation Exposure Index (if provided by the fluoroscopic device): Not applicable. Number of Acquired Spot Images: 0 PROCEDURE: Informed consent was obtained from the patient's daughter prior to the procedure, including potential complications of bleeding and infection. With the patient prone, the lower back was prepped with Betadine. 1% Lidocaine was used for local anesthesia. Lumbar puncture was performed at the L4/5 level using a 20 gauge needle with return of initially blood tinged CSF with an opening pressure of 9 cm water. 9 ml of CSF were obtained for laboratory studies. The patient tolerated the procedure well and there were no apparent complications. IMPRESSION: Successful lumbar puncture under fluoroscopic guidance, as detailed above. Electronically Signed   By: Abigail Miyamoto M.D.   On: 03/11/2019 15:03

## 2019-03-13 LAB — COMPREHENSIVE METABOLIC PANEL
ALT: 10 U/L (ref 0–44)
AST: 15 U/L (ref 15–41)
Albumin: 1.5 g/dL — ABNORMAL LOW (ref 3.5–5.0)
Alkaline Phosphatase: 92 U/L (ref 38–126)
Anion gap: 9 (ref 5–15)
BUN: 27 mg/dL — ABNORMAL HIGH (ref 8–23)
CO2: 21 mmol/L — ABNORMAL LOW (ref 22–32)
Calcium: 8.1 mg/dL — ABNORMAL LOW (ref 8.9–10.3)
Chloride: 114 mmol/L — ABNORMAL HIGH (ref 98–111)
Creatinine, Ser: 2.55 mg/dL — ABNORMAL HIGH (ref 0.44–1.00)
GFR calc Af Amer: 20 mL/min — ABNORMAL LOW (ref >60)
GFR calc non Af Amer: 17 mL/min — ABNORMAL LOW (ref >60)
Glucose, Bld: 123 mg/dL — ABNORMAL HIGH (ref 70–99)
Potassium: 4.1 mmol/L (ref 3.5–5.1)
Sodium: 144 mmol/L (ref 135–145)
Total Bilirubin: 0.8 mg/dL (ref 0.3–1.2)
Total Protein: 4.9 g/dL — ABNORMAL LOW (ref 6.5–8.1)

## 2019-03-13 LAB — CBC WITH DIFFERENTIAL/PLATELET
Abs Immature Granulocytes: 0.34 10*3/uL — ABNORMAL HIGH (ref 0.00–0.07)
Basophils Absolute: 0 10*3/uL (ref 0.0–0.1)
Basophils Relative: 0 %
Eosinophils Absolute: 0.6 10*3/uL — ABNORMAL HIGH (ref 0.0–0.5)
Eosinophils Relative: 5 %
HCT: 21.5 % — ABNORMAL LOW (ref 36.0–46.0)
Hemoglobin: 7.1 g/dL — ABNORMAL LOW (ref 12.0–15.0)
Immature Granulocytes: 3 %
Lymphocytes Relative: 12 %
Lymphs Abs: 1.5 10*3/uL (ref 0.7–4.0)
MCH: 29.1 pg (ref 26.0–34.0)
MCHC: 33 g/dL (ref 30.0–36.0)
MCV: 88.1 fL (ref 80.0–100.0)
Monocytes Absolute: 0.5 10*3/uL (ref 0.1–1.0)
Monocytes Relative: 4 %
Neutro Abs: 9.5 10*3/uL — ABNORMAL HIGH (ref 1.7–7.7)
Neutrophils Relative %: 76 %
Platelets: 295 10*3/uL (ref 150–400)
RBC: 2.44 MIL/uL — ABNORMAL LOW (ref 3.87–5.11)
RDW: 17.8 % — ABNORMAL HIGH (ref 11.5–15.5)
WBC: 12.5 10*3/uL — ABNORMAL HIGH (ref 4.0–10.5)
nRBC: 0.2 % (ref 0.0–0.2)

## 2019-03-13 LAB — BRAIN NATRIURETIC PEPTIDE: B Natriuretic Peptide: 307.2 pg/mL — ABNORMAL HIGH (ref 0.0–100.0)

## 2019-03-13 LAB — GLUCOSE, CAPILLARY
Glucose-Capillary: 110 mg/dL — ABNORMAL HIGH (ref 70–99)
Glucose-Capillary: 129 mg/dL — ABNORMAL HIGH (ref 70–99)
Glucose-Capillary: 109 mg/dL — ABNORMAL HIGH (ref 70–99)

## 2019-03-13 LAB — APTT: aPTT: 71 seconds — ABNORMAL HIGH (ref 24–36)

## 2019-03-13 LAB — NO BLOOD PRODUCTS

## 2019-03-13 LAB — HSV DNA BY PCR (REFERENCE LAB)
HSV 1 DNA: NEGATIVE
HSV 2 DNA: NEGATIVE

## 2019-03-13 LAB — D-DIMER, QUANTITATIVE: D-Dimer, Quant: 15.33 ug/mL-FEU — ABNORMAL HIGH (ref 0.00–0.50)

## 2019-03-13 LAB — MAGNESIUM: Magnesium: 1.9 mg/dL (ref 1.7–2.4)

## 2019-03-13 LAB — C-REACTIVE PROTEIN: CRP: 15.3 mg/dL — ABNORMAL HIGH (ref <1.0)

## 2019-03-13 MED ORDER — FERROUS SULFATE 325 (65 FE) MG PO TABS
325.0000 mg | ORAL_TABLET | Freq: Three times a day (TID) | ORAL | Status: DC
  Administered 2019-03-13 – 2019-03-22 (×26): 325 mg via ORAL
  Filled 2019-03-13 (×26): qty 1

## 2019-03-13 MED ORDER — FUROSEMIDE 40 MG PO TABS
40.0000 mg | ORAL_TABLET | Freq: Once | ORAL | Status: AC
  Administered 2019-03-13: 40 mg via ORAL
  Filled 2019-03-13: qty 1

## 2019-03-13 NOTE — Progress Notes (Signed)
Patient admitted from 5W to room 5M13. Pt is alert and oriented x 2-3. Skin intact with 4 inch fissure to coccyx. Full assessment to Epic. Denies pain. Updated patient's daughter on transfer and visitor regulations. Will continue to monitor. Dorthey Sawyer, RN

## 2019-03-13 NOTE — Progress Notes (Addendum)
PROGRESS NOTE                                                                                                                                                                                                             Patient Demographics:    [name], is a 79 y.o. female, DOB - 1940-06-27, PYK:[id number]  Outpatient Primary MD for the patient is Jonathon Jordan, MD    LOS - 3  Admit date - 03/09/2019    Chief Complaint  Patient presents with  . Altered Mental Status       Brief Narrative   [name] is a 79 y.o. female with PMH hypothyroidism, hx of craniectomy(for frontal temporary brain meningioma resection), CKD 4, rheumatoid arthritis, DM chronic anemia, chronic pain, recent COVID infection for which she was treated to [facility] and discharged few weeks ago, aortic stenosis.  Who lives at home and lately was having some diarrhea and generalized weakness, had multiple falls at home around 3-4 and thereafter became increasingly confused.  Was brought to the ER where she was found to have low-grade fever, had toxic and metabolic encephalopathy, dehydration, neurology was consulted.  She was started on empiric treatment for meningitis and admitted to the hospital.  Further work-up revealed that she simply was having seizures in response to severe colitis found on CT scan and further work-up.  She was started on Keppra mentation has improved, she is on Rocephin and Flagyl for colitis.  No diarrhea.  Clinically much better.  Of note patient was never requiring Covid 19 quarantine as she was admitted beyond 21 days of initial Covid diagnosis.  Family and patient wished to be moved out of Covid unit.  Will be transferred off of Covid unit on 03/13/2019 so that family can visit her.   Subjective:   Patient in bed, appears comfortable, denies any headache, no fever, no chest pain or pressure, no shortness of breath , no  abdominal pain. No focal weakness.    Assessment  & Plan :    1.  Toxic and metabolic encephalopathy.  Multiple falls at home, reported headache prior to admission.  Low-grade fever.  Seen by neurology, head CT unremarkable, MRI brain and LP unremarkable, EEG suspicious for epileptiform focus hence she has been placed on Keppra.  Patient is serially improving and now she is  close to baseline, no focal deficits, appears to have toxic encephalopathy with possible seizures in response to severe colitis found on CT scan.  2.  Recent COVID-19 pneumonia.  Fully treated.  She is out of quarantine period.   SpO2: 100 %  Recent Labs  Lab 03/10/19 0923 03/11/19 0747 03/12/19 0245 03/13/19 0221  CRP 25.7* 30.1* 24.9* 15.3*  DDIMER 11.89* 14.23* 17.05* 15.33*  BNP 1,628.4* 347.6* 182.1* 307.2*    Hepatic Function Latest Ref Rng & Units 03/13/2019 03/12/2019 03/11/2019  Total Protein 6.5 - 8.1 g/dL 4.9(L) 4.9(L) 5.3(L)  Albumin 3.5 - 5.0 g/dL 1.5(L) 1.4(L) 1.6(L)  AST 15 - 41 U/L 15 13(L) 16  ALT 0 - 44 U/L 10 11 12   Alk Phosphatase 38 - 126 U/L 92 94 105  Total Bilirubin 0.3 - 1.2 mg/dL 0.8 0.8 0.7    3.  CKD 4.  Baseline creatinine around 2.6.  Back to baseline after mild hydration.  4.  Urinary retention.  Flomax and Foley placed on the day of admission.  Foley can be removed after 6 to 7 days of placement after enough bladder rest.  5.  Hypothyroidism.  Check TSH and cortisol, IV Synthroid.  Lab Results  Component Value Date   TSH 0.857 03/10/2019    6.  History of meningioma with craniotomy in the past.  Supportive care.  Head CT nonacute.  7.  Dyslipidemia.  Once taking oral medications consistently will start home dose statin.  8.  History of moderate to severe AS.  Supportive care.  No acute issues.  Will require outpatient cardiology follow-up.  Could be a TAVR candidate.  9.  Elevated D-dimer with history of DVT.  Negative lower extremity venous duplex, on high-dose  heparin.  10.  Anemia of chronic disease, slightly worse with heme dilution, hold further fluids, gentle hydration, placed on oral iron, no signs of active bleeding, patient and family do not take blood transfusions even if it results in death.  11. Hyperkalemia and Hypomagnesemia - treated.  12.  CT evidence of severe colitis found during her fever work-up on 03/12/2019.  History of diverticulitis in the past with fistula repair done at [facility] several years ago.  She did have diarrhea prior to hospital admission, no diarrhea reported in the last 48 hours, currently placed on Rocephin and Flagyl with excellent clinical improvement, fevers have subsided, CRP is trending down, mentation has improved, has been placed on Soft diet.  Monitor.    If any worse will involve GI, if any diarrhea will rule out C. difficile, plan was discussed with Dr. Graylon Good ID, if clinical improvement no need to check C. difficile if diarrhea improves or she starts getting fever then we will check C. difficile currently having 1-2 loose bowel movements a day.  Monitor with supportive care.  Surprisingly patient has no abdominal pain or discomfort.     Condition - Extremely Guarded  Family Communication  : Daughter updated in detail on 03/10/2019, updated again on 03/11/2019, 03/12/2019, daughter updated again on 03/13/2019 wants her mother to be moved out of Covid unit as she and her family would like to visit her.  Code Status : No intubation  Diet :    Diet Order            DIET DYS 3 Room service appropriate? Yes; Fluid consistency: Thin  Diet effective now               Disposition Plan  : Patient in  PCU getting worked up for encephalopathy, sepsis now appears to be secondary to severe colitis.  Getting treatment for colitis.  Consults  : Neurology, ID over the phone Dr. Graylon Good on 03/13/2019  Procedures  :    CT head.  Nonacute.  EEG.  Final - This study showed evidence of epileptogenicity and cortical dysfunction  in right frontocentral region. No seizures were seen throughout the recording.  Lower extremity venous duplex.  No DVT.  LP - 1 wbc  CT abdomen pelvis noncontrast- 1. Findings are concerning for severe colitis, as above. 2. Findings in the lungs which likely reflect evolving post infectious or inflammatory scarring given the patient's history of recent COVID-19 infection. 3. Aortic atherosclerosis, in addition to left main and 3 vessel coronary artery disease. 4. There are calcifications of the aortic valve. Echocardiographic correlation for evaluation of potential valvular dysfunction may be warranted if clinically indicated. 5. Additional incidental findings, as above.  MRI brain.  Nonacute.    PUD Prophylaxis : none  DVT Prophylaxis  :    Heparin   Lab Results  Component Value Date   PLT 295 03/13/2019    Inpatient Medications  Scheduled Meds: . heparin  7,500 Units Subcutaneous Q8H  . insulin aspart  0-9 Units Subcutaneous TID WC  . levETIRAcetam  500 mg Oral BID  . levothyroxine  150 mcg Oral Q0600  . tamsulosin  0.4 mg Oral Daily   Continuous Infusions: . cefTRIAXone (ROCEPHIN)  IV Stopped (03/12/19 1643)  . lactated ringers 75 mL/hr at 03/12/19 2159  . metronidazole 500 mg (03/13/19 0425)   PRN Meds:.acetaminophen, albuterol  Antibiotics  :    Anti-infectives (From admission, onward)   Start     Dose/Rate Route Frequency Ordered Stop   03/12/19 1200  metroNIDAZOLE (FLAGYL) IVPB 500 mg     500 mg 100 mL/hr over 60 Minutes Intravenous Every 8 hours 03/12/19 1048     03/12/19 1200  cefTRIAXone (ROCEPHIN) 2 g in sodium chloride 0.9 % 100 mL IVPB     2 g 200 mL/hr over 30 Minutes Intravenous Every 24 hours 03/12/19 1049 03/15/19 1159   03/12/19 1033  vancomycin variable dose per unstable renal function (pharmacist dosing)  Status:  Discontinued      Does not apply See admin instructions 03/12/19 1033 03/12/19 1048   03/11/19 2300  meropenem (MERREM) 1 g in sodium  chloride 0.9 % 100 mL IVPB  Status:  Discontinued     1 g 200 mL/hr over 30 Minutes Intravenous Every 12 hours 03/11/19 2217 03/12/19 1048   03/11/19 1130  vancomycin (VANCOCIN) IVPB 1000 mg/200 mL premix  Status:  Discontinued     1,000 mg 200 mL/hr over 60 Minutes Intravenous Every 48 hours 03/11/19 1032 03/12/19 1033   03/11/19 1130  fluconazole (DIFLUCAN) IVPB 100 mg     100 mg 50 mL/hr over 60 Minutes Intravenous  Once 03/11/19 1035 03/12/19 0943   03/11/19 0600  acyclovir (ZOVIRAX) 800 mg in dextrose 5 % 150 mL IVPB  Status:  Discontinued     800 mg 166 mL/hr over 60 Minutes Intravenous Every 24 hours 03/10/19 1234 03/11/19 1715   03/10/19 1600  meropenem (MERREM) 1 g in sodium chloride 0.9 % 100 mL IVPB  Status:  Discontinued     1 g 200 mL/hr over 30 Minutes Intravenous Every 12 hours 03/10/19 1247 03/11/19 2217   03/10/19 0032  vancomycin variable dose per unstable renal function (pharmacist dosing)  Status:  Discontinued  Does not apply See admin instructions 03/10/19 0032 03/11/19 1032   03/09/19 2330  acyclovir (ZOVIRAX) 800 mg in dextrose 5 % 150 mL IVPB  Status:  Discontinued     800 mg 166 mL/hr over 60 Minutes Intravenous Every 8 hours 03/09/19 2321 03/10/19 1234   03/09/19 2330  meropenem (MERREM) 1 g in sodium chloride 0.9 % 100 mL IVPB  Status:  Discontinued     1 g 200 mL/hr over 30 Minutes Intravenous Every 8 hours 03/09/19 2324 03/10/19 1247   03/09/19 2330  vancomycin (VANCOREADY) IVPB 1500 mg/300 mL     1,500 mg 150 mL/hr over 120 Minutes Intravenous  Once 03/09/19 2324 03/10/19 0601       Time Spent in minutes  30   Lala Lund M.D on 03/13/2019 at 9:51 AM  To page go to [email or web address] - password [email or web address]  [facility] -  Office  [phone]    See all Orders from today for further details    Objective:   Vitals:   03/13/19 0005 03/13/19 0321 03/13/19 0400 03/13/19 0800  BP: 137/61 (!) 137/59 140/65 (!) 146/58  Pulse: 85   71  Resp: 16    16  Temp: 98.1 F (36.7 C) 98.3 F (36.8 C)  98 F (36.7 C)  TempSrc: Oral Axillary  Oral  SpO2: 100%   100%  Weight:  80.8 kg    Height:        Wt Readings from Last 3 Encounters:  03/13/19 80.8 kg  02/27/19 88 kg  01/12/19 82.1 kg     Intake/Output Summary (Last 24 hours) at 03/13/2019 0951 Last data filed at 03/13/2019 0900 Gross per 24 hour  Intake 1667.55 ml  Output 950 ml  Net 717.55 ml     Physical Exam  Patient in bed, much more awake and alert today, following all commands and answering all basic questions, no focal deficits [place].AT,PERRAL Supple Neck,No JVD, No cervical lymphadenopathy appriciated.  Symmetrical Chest wall movement, Good air movement bilaterally, CTAB RRR,No Gallops, Rubs or new Murmurs, No Parasternal Heave +ve B.Sounds, Abd Soft, No tenderness, No organomegaly appriciated, No rebound - guarding or rigidity. No Cyanosis, Clubbing or edema, No new Rash or bruise     Data Review:    CBC Recent Labs  Lab 03/09/19 1944 03/11/19 0747 03/12/19 0245 03/13/19 0221  WBC 8.2 13.6* 11.8* 12.5*  HGB 7.8* 8.6* 7.5* 7.1*  HCT 25.2* 26.7* 23.2* 21.5*  PLT 210 288 298 295  MCV 94.4 90.2 88.9 88.1  MCH 29.2 29.1 28.7 29.1  MCHC 31.0 32.2 32.3 33.0  RDW 18.3* 18.2* 18.2* 17.8*  LYMPHSABS 1.7 1.3 1.2 1.5  MONOABS 0.5 0.4 0.5 0.5  EOSABS 0.4 0.2 0.5 0.6*  BASOSABS 0.0 0.1 0.0 0.0    Chemistries  Recent Labs  Lab 03/09/19 1944 03/09/19 1944 03/10/19 0923 03/10/19 1846 03/11/19 0747 03/12/19 0245 03/13/19 0221  NA 137  --  139  --  143 143 144  K 4.8   < > 6.0* 4.7 4.1 3.7 4.1  CL 108  --  112*  --  112* 112* 114*  CO2 18*  --  13*  --  18* 21* 21*  GLUCOSE 143*  --  98  --  161* 127* 123*  BUN 19  --  19  --  23 27* 27*  CREATININE 3.00*  --  2.78*  --  2.94* 2.94* 2.55*  CALCIUM 7.8*  --  7.8*  --  8.1* 8.2* 8.1*  MG  --   --  1.4*  --  2.0 2.0 1.9  AST  --   --   --   --  16 13* 15  ALT  --   --   --   --  12 11 10   ALKPHOS  --    --   --   --  105 94 92  BILITOT  --   --   --   --  0.7 0.8 0.8   < > = values in this interval not displayed.   ------------------------------------------------------------------------------------------------------------------ No results for input(s): CHOL, HDL, LDLCALC, TRIG, CHOLHDL, LDLDIRECT in the last 72 hours.  Lab Results  Component Value Date   HGBA1C 6.6 (H) 02/15/2019   ------------------------------------------------------------------------------------------------------------------ Recent Labs    03/10/19 1846  TSH 0.857    Cardiac Enzymes No results for input(s): CKMB, TROPONINI, MYOGLOBIN in the last 168 hours.  Invalid input(s): CK ------------------------------------------------------------------------------------------------------------------    Component Value Date/Time   BNP 307.2 (H) 03/13/2019 0221    Micro Results Recent Results (from the past 240 hour(s))  Blood Culture (routine x 2)     Status: None (Preliminary result)   Collection Time: 03/09/19  7:44 PM   Specimen: BLOOD  Result Value Ref Range Status   Specimen Description BLOOD RIGHT ANTECUBITAL  Final   Special Requests   Final    BOTTLES DRAWN AEROBIC AND ANAEROBIC Blood Culture results may not be optimal due to an inadequate volume of blood received in culture bottles   Culture   Final    NO GROWTH 4 DAYS Performed at [facility], [place] [address] [place], [place] [address]    Report Status PENDING  Incomplete  Blood Culture (routine x 2)     Status: None (Preliminary result)   Collection Time: 03/09/19  7:49 PM   Specimen: BLOOD  Result Value Ref Range Status   Specimen Description BLOOD LEFT ANTECUBITAL  Final   Special Requests   Final    BOTTLES DRAWN AEROBIC AND ANAEROBIC Blood Culture results may not be optimal due to an inadequate volume of blood received in culture bottles   Culture   Final    NO GROWTH 4 DAYS Performed at [facility], [place] [address]  [place], [place] [address]    Report Status PENDING  Incomplete  Urine culture     Status: Abnormal   Collection Time: 03/09/19  8:00 PM   Specimen: In/Out Cath Urine  Result Value Ref Range Status   Specimen Description IN/OUT CATH URINE  Final   Special Requests   Final    NONE Performed at [facility], [place] [address], [place], [place] [address]    Culture >=100,000 COLONIES/mL YEAST (A)  Final   Report Status 03/10/2019 FINAL  Final  CSF culture     Status: None (Preliminary result)   Collection Time: 03/11/19  2:48 PM   Specimen: PATH Cytology CSF; Cerebrospinal Fluid  Result Value Ref Range Status   Specimen Description CSF  Final   Special Requests NONE  Final   Gram Stain   Final    WBC PRESENT,BOTH PMN AND MONONUCLEAR NO ORGANISMS SEEN CYTOSPIN SMEAR    Culture   Final    NO GROWTH 2 DAYS Performed at [facility], [place] [address] [place],  [address]    Report Status PENDING  Incomplete  MRSA PCR Screening     Status: None   Collection Time: 03/12/19  9:29 AM   Specimen: Nasal Mucosa; Nasopharyngeal  Result Value Ref Range Status   MRSA by PCR NEGATIVE NEGATIVE Final    Comment:        The GeneXpert MRSA Assay (FDA approved for NASAL specimens only), is one component of a comprehensive MRSA colonization surveillance program. It is not intended to diagnose MRSA infection nor to guide or monitor treatment for MRSA infections. Performed at [facility], [place] [address], [place], [place] [address]     Radiology Reports CT ABDOMEN PELVIS WO CONTRAST  Result Date: 03/12/2019 CLINICAL DATA:  79 year old female with history of COVID-19 pneumonia. Generalized weakness and diarrhea. EXAM: CT CHEST, ABDOMEN AND PELVIS WITHOUT CONTRAST TECHNIQUE: Multidetector CT imaging of the chest, abdomen and pelvis was performed following the standard protocol without IV contrast. COMPARISON:  Chest CT 08/05/2016.  CT the abdomen 01/27/2006. FINDINGS: CT CHEST  FINDINGS Cardiovascular: Heart size is normal. There is no significant pericardial fluid, thickening or pericardial calcification. There is aortic atherosclerosis, as well as atherosclerosis of the great vessels of the mediastinum and the coronary arteries, including calcified atherosclerotic plaque in the left main, left anterior descending, left circumflex and right coronary arteries. Severe calcifications of the aortic valve. Mediastinum/Nodes: No pathologically enlarged mediastinal or hilar lymph nodes. Please note that accurate exclusion of hilar adenopathy is limited on noncontrast CT scans. Esophagus is unremarkable in appearance. No axillary lymphadenopathy. Lungs/Pleura: Patchy areas of ground-glass attenuation, septal thickening, thickening of the peribronchovascular interstitium and regional architectural distortion in the mid to lower lungs bilaterally, likely to reflect mild post infectious or inflammatory scarring in this patient with history of recent COVID-19 infection. Trace bilateral pleural effusions lying dependently. No suspicious appearing pulmonary nodules or masses are noted. Musculoskeletal: There are no aggressive appearing lytic or blastic lesions noted in the visualized portions of the skeleton. CT ABDOMEN PELVIS FINDINGS Hepatobiliary: 1.4 cm low-attenuation lesion in segment 4A of the liver, incompletely characterized on today's non-contrast CT examination, but similar to the prior study and most compatible with a cyst. No other suspicious hepatic lesions. Unenhanced appearance of the gallbladder is unremarkable. Pancreas: No definite pancreatic mass or peripancreatic fluid collections or inflammatory changes are noted on today's noncontrast CT examination. Spleen: Unremarkable. Adrenals/Urinary Tract: Exophytic 2.4 cm low-attenuation lesion in the lower pole of the left kidney, incompletely characterized on today's noncontrast CT examination but statistically likely to represent a  cyst. Unenhanced appearance of the right kidney and bilateral adrenal glands are normal in appearance. No hydroureteronephrosis. Urinary bladder is nearly decompressed with an indwelling Foley balloon catheter. Small amount of gas in the non dependent portion of the urinary bladder is iatrogenic. Stomach/Bowel: Unenhanced appearance of the stomach is normal. No pathologic dilatation of small bowel or colon. Suture line near the rectosigmoid junction from prior partial bowel resection. Adjacent haziness and soft tissue stranding in the presacral soft tissues may be related to prior radiation therapy. Extensive mural thickening throughout the colon most severe from the transverse colon to the proximal sigmoid colon where there are surrounding inflammatory changes, compatible with a colitis. Vascular/Lymphatic: Aortic atherosclerosis. No lymphadenopathy noted in the abdomen or pelvis. Reproductive: Status post hysterectomy. Ovaries are not confidently identified may be surgically absent or atrophic. Other: No significant volume of ascites.  No pneumoperitoneum. Musculoskeletal: There are no aggressive appearing lytic or blastic lesions noted in the visualized portions of the skeleton. IMPRESSION: 1. Findings are concerning for severe colitis, as above. 2. Findings in the lungs which likely reflect evolving  post infectious or inflammatory scarring given the patient's history of recent COVID-19 infection. 3. Aortic atherosclerosis, in addition to left main and 3 vessel coronary artery disease. 4. There are calcifications of the aortic valve. Echocardiographic correlation for evaluation of potential valvular dysfunction may be warranted if clinically indicated. 5. Additional incidental findings, as above. Electronically Signed   By: Vinnie Langton M.D.   On: 03/12/2019 09:49   CT Head Wo Contrast  Result Date: 03/09/2019 CLINICAL DATA:  Altered mental status and increasing confusion, history of COVID-19 positivity  EXAM: CT HEAD WITHOUT CONTRAST TECHNIQUE: Contiguous axial images were obtained from the base of the skull through the vertex without intravenous contrast. COMPARISON:  02/10/2018 FINDINGS: Brain: Mild atrophic changes and chronic white matter ischemic changes are seen stable from the prior exam. No findings to suggest acute hemorrhage, acute infarction or space-occupying mass lesion are noted. Vascular: No hyperdense vessel or unexpected calcification. Skull: Changes consistent with prior craniotomy are noted in right temporoparietal region Sinuses/Orbits: No acute finding. Other: None. IMPRESSION: Chronic atrophic and ischemic changes without acute abnormality. Electronically Signed   By: Inez Catalina M.D.   On: 03/09/2019 21:10   CT CHEST WO CONTRAST  Result Date: 03/12/2019 CLINICAL DATA:  79 year old female with history of COVID-19 pneumonia. Generalized weakness and diarrhea. EXAM: CT CHEST, ABDOMEN AND PELVIS WITHOUT CONTRAST TECHNIQUE: Multidetector CT imaging of the chest, abdomen and pelvis was performed following the standard protocol without IV contrast. COMPARISON:  Chest CT 08/05/2016.  CT the abdomen 01/27/2006. FINDINGS: CT CHEST FINDINGS Cardiovascular: Heart size is normal. There is no significant pericardial fluid, thickening or pericardial calcification. There is aortic atherosclerosis, as well as atherosclerosis of the great vessels of the mediastinum and the coronary arteries, including calcified atherosclerotic plaque in the left main, left anterior descending, left circumflex and right coronary arteries. Severe calcifications of the aortic valve. Mediastinum/Nodes: No pathologically enlarged mediastinal or hilar lymph nodes. Please note that accurate exclusion of hilar adenopathy is limited on noncontrast CT scans. Esophagus is unremarkable in appearance. No axillary lymphadenopathy. Lungs/Pleura: Patchy areas of ground-glass attenuation, septal thickening, thickening of the  peribronchovascular interstitium and regional architectural distortion in the mid to lower lungs bilaterally, likely to reflect mild post infectious or inflammatory scarring in this patient with history of recent COVID-19 infection. Trace bilateral pleural effusions lying dependently. No suspicious appearing pulmonary nodules or masses are noted. Musculoskeletal: There are no aggressive appearing lytic or blastic lesions noted in the visualized portions of the skeleton. CT ABDOMEN PELVIS FINDINGS Hepatobiliary: 1.4 cm low-attenuation lesion in segment 4A of the liver, incompletely characterized on today's non-contrast CT examination, but similar to the prior study and most compatible with a cyst. No other suspicious hepatic lesions. Unenhanced appearance of the gallbladder is unremarkable. Pancreas: No definite pancreatic mass or peripancreatic fluid collections or inflammatory changes are noted on today's noncontrast CT examination. Spleen: Unremarkable. Adrenals/Urinary Tract: Exophytic 2.4 cm low-attenuation lesion in the lower pole of the left kidney, incompletely characterized on today's noncontrast CT examination but statistically likely to represent a cyst. Unenhanced appearance of the right kidney and bilateral adrenal glands are normal in appearance. No hydroureteronephrosis. Urinary bladder is nearly decompressed with an indwelling Foley balloon catheter. Small amount of gas in the non dependent portion of the urinary bladder is iatrogenic. Stomach/Bowel: Unenhanced appearance of the stomach is normal. No pathologic dilatation of small bowel or colon. Suture line near the rectosigmoid junction from prior partial bowel resection. Adjacent haziness and soft tissue stranding in  the presacral soft tissues may be related to prior radiation therapy. Extensive mural thickening throughout the colon most severe from the transverse colon to the proximal sigmoid colon where there are surrounding inflammatory changes,  compatible with a colitis. Vascular/Lymphatic: Aortic atherosclerosis. No lymphadenopathy noted in the abdomen or pelvis. Reproductive: Status post hysterectomy. Ovaries are not confidently identified may be surgically absent or atrophic. Other: No significant volume of ascites.  No pneumoperitoneum. Musculoskeletal: There are no aggressive appearing lytic or blastic lesions noted in the visualized portions of the skeleton. IMPRESSION: 1. Findings are concerning for severe colitis, as above. 2. Findings in the lungs which likely reflect evolving post infectious or inflammatory scarring given the patient's history of recent COVID-19 infection. 3. Aortic atherosclerosis, in addition to left main and 3 vessel coronary artery disease. 4. There are calcifications of the aortic valve. Echocardiographic correlation for evaluation of potential valvular dysfunction may be warranted if clinically indicated. 5. Additional incidental findings, as above. Electronically Signed   By: Vinnie Langton M.D.   On: 03/12/2019 09:49   MR BRAIN WO CONTRAST  Result Date: 03/11/2019 CLINICAL DATA:  Encephalopathy, history of meningioma resection EXAM: MRI HEAD WITHOUT CONTRAST TECHNIQUE: Multiplanar, multiecho pulse sequences of the brain and surrounding structures were obtained without intravenous contrast. COMPARISON:  2018 FINDINGS: Motion artifact is present. Brain: There is no acute infarction or intracranial hemorrhage. There is no intracranial mass, mass effect, or edema. There is no hydrocephalus or extra-axial fluid collection. Patchy T2 hyperintensity in the supratentorial white matter is nonspecific but probably reflects stable chronic microvascular ischemic changes. Vascular: Major vessel flow voids at the skull base are preserved. Skull and upper cervical spine: Right frontal craniotomy. Normal marrow signal is preserved. Sinuses/Orbits: Paranasal sinuses are aerated. No significant orbital abnormality. Other: Sella is  unremarkable.  Mastoid air cells are clear. IMPRESSION: Motion degraded. No acute infarction or other acute abnormality. Stable chronic microvascular ischemic changes. Electronically Signed   By: Macy Mis M.D.   On: 03/11/2019 13:03   US RENAL  Result Date: 02/16/2019 CLINICAL DATA:  Acute kidney injury. EXAM: RENAL / URINARY TRACT ULTRASOUND COMPLETE COMPARISON:  None. FINDINGS: Right Kidney: Renal measurements: 8.7 x 5.3 x 5.2 cm = volume: 123 mL. 1.6 cm cyst is noted in upper pole. Increased echogenicity of renal parenchyma is noted. No mass or hydronephrosis visualized. Left Kidney: Renal measurements: 10.4 x 5.8 x 5.8 cm = volume: 183 mL. 2.1 cm cyst is seen in lower pole. Increased echogenicity of renal parenchyma is noted. No mass or hydronephrosis visualized. Bladder: Appears normal for degree of bladder distention. Other: None. IMPRESSION: Increased echogenicity of renal parenchyma is noted bilaterally consistent with medical renal disease. No hydronephrosis or renal obstruction is noted. Electronically Signed   By: Marijo Conception M.D.   On: 02/16/2019 09:43   DG Chest Portable 1 View  Result Date: 03/09/2019 CLINICAL DATA:  Altered mental status. Possible pulmonary infection. EXAM: PORTABLE CHEST 1 VIEW COMPARISON:  February 14, 2019. FINDINGS: Stable mild cardiomegaly. Central pulmonary vascular congestion without overt pulmonary edema or obvious pleural effusion. Low lung volumes. Linear atelectasis in the left lower lung zone. No pneumonia. Moderate skeletal degenerative change and telemetry leads. IMPRESSION: Stable mild cardiomegaly with central pulmonary vascular congestion. No pneumonia or pulmonary edema. Electronically Signed   By: Revonda Humphrey   On: 03/09/2019 21:41   DG Chest Port 1 View  Result Date: 02/14/2019 CLINICAL DATA:  Sepsis EXAM: PORTABLE CHEST 1 VIEW COMPARISON:  02/10/2018 FINDINGS:  Heart and mediastinal contours are within normal limits. No focal opacities or  effusions. No acute bony abnormality. IMPRESSION: No active disease. Electronically Signed   By: Rolm Baptise M.D.   On: 02/14/2019 18:29   Overnight EEG with video  Result Date: 03/10/2019 Lora Havens, MD     03/11/2019  9:03 AM Patient Name: GAYLENE MOYLAN MRN: [id number] Epilepsy Attending: Lora Havens Referring Physician/Provider: Dr Roland Rack Duration: 03/09/2019 2308 to 03/10/2019 2308 Patient history: 79 year old female presenting with polymyoclonus, fever of unclear etiology and altered mental status. EEG to evaluate for seizure. Level of alertness: awake/lethargic AEDs during EEG study: None Technical aspects: This EEG study was done with scalp electrodes positioned according to the 10-20 International system of electrode placement. Electrical activity was acquired at a sampling rate of 500Hz  and reviewed with a high frequency filter of 70Hz  and a low frequency filter of 1Hz . EEG data were recorded continuously and digitally stored. DESCRIPTION: No clear posterior dominant rhythm was seen. EEG showed continuous generalized and maximal right frontocentral region. Sharp waves were also seen in right frontocentral region. Hyperventilation and photic stimulation were not performed. Event button was pressed on 03/10/2019 at 1016 for unclear reason. Cocomitant eeg before, during and after the event didn't show any eeg change to suggest seizure. 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 No seizures were seen throughout the recording. Event button was pressed on 03/10/2019 at 1016 for unclear reason. Cocomitant eeg before, during and after the event didn't show any eeg change to suggest seizure. Priyanka O Yadav   VAS [place] LOWER EXTREMITY VENOUS (DVT)  Result Date: 03/10/2019  Lower Venous DVTStudy Indications:  Elevated Ddimer.  Risk Factors: COVID 19 positive. Comparison Study: No prior studies. Performing Technologist: Oliver Hum RVT  Examination Guidelines: A complete evaluation includes B-mode imaging, spectral Doppler, color Doppler, and power Doppler as needed of all accessible portions of each vessel. Bilateral testing is considered an integral part of a complete examination. Limited examinations for reoccurring indications may be performed as noted. The reflux portion of the exam is performed with the patient in reverse Trendelenburg.  +---------+---------------+---------+-----------+----------+--------------+ RIGHT    CompressibilityPhasicitySpontaneityPropertiesThrombus Aging +---------+---------------+---------+-----------+----------+--------------+ CFV      Full           Yes      Yes                                 +---------+---------------+---------+-----------+----------+--------------+ SFJ      Full                                                        +---------+---------------+---------+-----------+----------+--------------+ FV Prox  Full                                                        +---------+---------------+---------+-----------+----------+--------------+ FV Mid   Full                                                        +---------+---------------+---------+-----------+----------+--------------+  FV DistalFull                                                        +---------+---------------+---------+-----------+----------+--------------+ PFV      Full                                                        +---------+---------------+---------+-----------+----------+--------------+ POP      Full           Yes      Yes                                 +---------+---------------+---------+-----------+----------+--------------+ PTV      Full                                                         +---------+---------------+---------+-----------+----------+--------------+ PERO     Full                                                        +---------+---------------+---------+-----------+----------+--------------+   +---------+---------------+---------+-----------+----------+--------------+ LEFT     CompressibilityPhasicitySpontaneityPropertiesThrombus Aging +---------+---------------+---------+-----------+----------+--------------+ CFV      Full           Yes      Yes                                 +---------+---------------+---------+-----------+----------+--------------+ SFJ      Full                                                        +---------+---------------+---------+-----------+----------+--------------+ FV Prox  Full                                                        +---------+---------------+---------+-----------+----------+--------------+ FV Mid   Full                                                        +---------+---------------+---------+-----------+----------+--------------+ FV Distal               Yes      Yes                                 +---------+---------------+---------+-----------+----------+--------------+  PFV      Full                                                        +---------+---------------+---------+-----------+----------+--------------+ POP      Full           Yes      Yes                                 +---------+---------------+---------+-----------+----------+--------------+ PTV      Full                                                        +---------+---------------+---------+-----------+----------+--------------+ PERO     Full                                                        +---------+---------------+---------+-----------+----------+--------------+     Summary: RIGHT: - There is no evidence of deep vein thrombosis in the lower extremity. However, portions of this  examination were limited- see technologist comments above.  - No cystic structure found in the popliteal fossa.  LEFT: - There is no evidence of deep vein thrombosis in the lower extremity. However, portions of this examination were limited- see technologist comments above.  - No cystic structure found in the popliteal fossa.  *See table(s) above for measurements and observations. Electronically signed by Servando Snare MD on 03/10/2019 at 4:08:34 PM.    Final    VAS [place] LOWER EXTREMITY VENOUS (DVT)  Result Date: 02/22/2019  Lower Venous DVTStudy Indications: Hx of DVT.  Limitations: Body habitus and sunlight in the room, uncooperative patient. Performing Technologist: Antonieta Pert RDMS, RVT  Examination Guidelines: A complete evaluation includes B-mode imaging, spectral Doppler, color Doppler, and power Doppler as needed of all accessible portions of each vessel. Bilateral testing is considered an integral part of a complete examination. Limited examinations for reoccurring indications may be performed as noted. The reflux portion of the exam is performed with the patient in reverse Trendelenburg.  +---------+---------------+---------+-----------+----------+-------------------+ RIGHT    CompressibilityPhasicitySpontaneityPropertiesThrombus Aging      +---------+---------------+---------+-----------+----------+-------------------+ CFV      Full           Yes      Yes                                      +---------+---------------+---------+-----------+----------+-------------------+ SFJ      Full                                                             +---------+---------------+---------+-----------+----------+-------------------+ FV Prox  Full                                                             +---------+---------------+---------+-----------+----------+-------------------+  FV Mid   Full                                                              +---------+---------------+---------+-----------+----------+-------------------+ FV DistalFull                                                             +---------+---------------+---------+-----------+----------+-------------------+ PFV      Full                                                             +---------+---------------+---------+-----------+----------+-------------------+ POP                                                   Not visualized      +---------+---------------+---------+-----------+----------+-------------------+ PTV                                                   poor visualization                                                        due to pt                                                                 cooperation and                                                           tolerance.          +---------+---------------+---------+-----------+----------+-------------------+ PERO                                                  poor visualization  due to pt                                                                 cooperation and                                                           tolerance.          +---------+---------------+---------+-----------+----------+-------------------+ GSV      Full                                                             +---------+---------------+---------+-----------+----------+-------------------+   +---------+---------------+---------+-----------+----------+-------------------+ LEFT     CompressibilityPhasicitySpontaneityPropertiesThrombus Aging      +---------+---------------+---------+-----------+----------+-------------------+ CFV      Full           Yes      Yes                                      +---------+---------------+---------+-----------+----------+-------------------+  SFJ      Full                                                             +---------+---------------+---------+-----------+----------+-------------------+ FV Prox  Full                                                             +---------+---------------+---------+-----------+----------+-------------------+ FV Mid   Full                                                             +---------+---------------+---------+-----------+----------+-------------------+ FV Distal                                             Not visualized      +---------+---------------+---------+-----------+----------+-------------------+ PFV      Full                                                             +---------+---------------+---------+-----------+----------+-------------------+  POP                     Yes      Yes                  unable to compress                                                        due to pt                                                                 cooperation and                                                           tolerance.          +---------+---------------+---------+-----------+----------+-------------------+ PTV                                                   not well visualized +---------+---------------+---------+-----------+----------+-------------------+ PERO                                                  not well visualized +---------+---------------+---------+-----------+----------+-------------------+ GSV      Full                                                             +---------+---------------+---------+-----------+----------+-------------------+     Summary: RIGHT: - There is no evidence of deep vein thrombosis in the lower extremity. However, portions of this examination were limited- see technologist comments above.  - A cystic structure is found in the popliteal fossa.  LEFT: - There is  no evidence of deep vein thrombosis in the lower extremity. However, portions of this examination were limited- see technologist comments above.  - No cystic structure found in the popliteal fossa.  *See table(s) above for measurements and observations. Electronically signed by Deitra Mayo MD on 02/22/2019 at 4:12:36 PM.    Final    VAS [place] LOWER EXTREMITY VENOUS (DVT)  Result Date: 02/17/2019  Lower Venous DVTStudy Indications: Elevated ddimer.  Limitations: Body habitus, poor ultrasound/tissue interface and pain tolerance. Comparison Study: 02/10/18 previous Performing Technologist: Abram Sander RVS  Examination Guidelines: A complete evaluation includes B-mode imaging, spectral Doppler, color Doppler, and power Doppler as needed of all accessible portions of each vessel. Bilateral testing is considered an integral part of a complete examination. Limited examinations for reoccurring indications may  be performed as noted. The reflux portion of the exam is performed with the patient in reverse Trendelenburg.  +---------+---------------+---------+-----------+----------+--------------+ RIGHT    CompressibilityPhasicitySpontaneityPropertiesThrombus Aging +---------+---------------+---------+-----------+----------+--------------+ CFV      Full           Yes      Yes                                 +---------+---------------+---------+-----------+----------+--------------+ SFJ      Full                                                        +---------+---------------+---------+-----------+----------+--------------+ FV Prox  Full                                                        +---------+---------------+---------+-----------+----------+--------------+ FV Mid   Full                                                        +---------+---------------+---------+-----------+----------+--------------+ FV Distal               Yes      Yes                                  +---------+---------------+---------+-----------+----------+--------------+ PFV      Full                                                        +---------+---------------+---------+-----------+----------+--------------+ POP      Full           Yes      Yes                                 +---------+---------------+---------+-----------+----------+--------------+ PTV      Full                                                        +---------+---------------+---------+-----------+----------+--------------+ PERO                                                  Not visualized +---------+---------------+---------+-----------+----------+--------------+   +---------+---------------+---------+-----------+----------+--------------+ LEFT     CompressibilityPhasicitySpontaneityPropertiesThrombus Aging +---------+---------------+---------+-----------+----------+--------------+ CFV      Full           Yes      Yes                                 +---------+---------------+---------+-----------+----------+--------------+  SFJ      Full                                                        +---------+---------------+---------+-----------+----------+--------------+ FV Prox  Full                                                        +---------+---------------+---------+-----------+----------+--------------+ FV Mid   Full                                                        +---------+---------------+---------+-----------+----------+--------------+ FV Distal               Yes      Yes                                 +---------+---------------+---------+-----------+----------+--------------+ PFV      Full                                                        +---------+---------------+---------+-----------+----------+--------------+ POP      Full           Yes      Yes                                  +---------+---------------+---------+-----------+----------+--------------+ PTV      Full                                                        +---------+---------------+---------+-----------+----------+--------------+ PERO                                                  Not visualized +---------+---------------+---------+-----------+----------+--------------+     Summary: BILATERAL: - No evidence of deep vein thrombosis seen in the lower extremities, bilaterally.   *See table(s) above for measurements and observations. Electronically signed by Harold Barban MD on 02/17/2019 at 7:28:13 AM.    Final    DG FL GUIDED LUMBAR PUNCTURE  Result Date: 03/11/2019 CLINICAL DATA:  Mental status changes.  COVID-19 positive. EXAM: DIAGNOSTIC LUMBAR PUNCTURE UNDER FLUOROSCOPIC GUIDANCE FLUOROSCOPY TIME:  Fluoroscopy Time:  1 minutes and 24 seconds Radiation Exposure Index (if provided by the fluoroscopic device): Not applicable. Number of Acquired Spot Images: 0 PROCEDURE: Informed consent was obtained from the patient's daughter prior to the procedure, including potential complications of bleeding and infection. With the patient prone,  the lower back was prepped with Betadine. 1% Lidocaine was used for local anesthesia. Lumbar puncture was performed at the L4/5 level using a 20 gauge needle with return of initially blood tinged CSF with an opening pressure of 9 cm water. 9 ml of CSF were obtained for laboratory studies. The patient tolerated the procedure well and there were no apparent complications. IMPRESSION: Successful lumbar puncture under fluoroscopic guidance, as detailed above. Electronically Signed   By: Abigail Miyamoto M.D.   On: 03/11/2019 15:03

## 2019-03-14 LAB — CBC WITH DIFFERENTIAL/PLATELET
Abs Immature Granulocytes: 0.43 10*3/uL — ABNORMAL HIGH (ref 0.00–0.07)
Basophils Absolute: 0 10*3/uL (ref 0.0–0.1)
Basophils Relative: 0 %
Eosinophils Absolute: 0.5 10*3/uL (ref 0.0–0.5)
Eosinophils Relative: 6 %
HCT: 20.7 % — ABNORMAL LOW (ref 36.0–46.0)
Hemoglobin: 6.7 g/dL — CL (ref 12.0–15.0)
Immature Granulocytes: 5 %
Lymphocytes Relative: 21 %
Lymphs Abs: 1.8 10*3/uL (ref 0.7–4.0)
MCH: 28.8 pg (ref 26.0–34.0)
MCHC: 32.4 g/dL (ref 30.0–36.0)
MCV: 88.8 fL (ref 80.0–100.0)
Monocytes Absolute: 0.3 10*3/uL (ref 0.1–1.0)
Monocytes Relative: 4 %
Neutro Abs: 5.4 10*3/uL (ref 1.7–7.7)
Neutrophils Relative %: 64 %
Platelets: 303 10*3/uL (ref 150–400)
RBC: 2.33 MIL/uL — ABNORMAL LOW (ref 3.87–5.11)
RDW: 17.9 % — ABNORMAL HIGH (ref 11.5–15.5)
WBC: 8.4 10*3/uL (ref 4.0–10.5)
nRBC: 0 % (ref 0.0–0.2)

## 2019-03-14 LAB — CULTURE, BLOOD (ROUTINE X 2)
Culture: NO GROWTH
Culture: NO GROWTH

## 2019-03-14 LAB — IRON AND TIBC
Iron: 48 ug/dL (ref 28–170)
Saturation Ratios: 47 % — ABNORMAL HIGH (ref 10.4–31.8)
TIBC: 102 ug/dL — ABNORMAL LOW (ref 250–450)
UIBC: 54 ug/dL

## 2019-03-14 LAB — CSF CULTURE W GRAM STAIN: Culture: NO GROWTH

## 2019-03-14 LAB — MAGNESIUM: Magnesium: 1.7 mg/dL (ref 1.7–2.4)

## 2019-03-14 LAB — GLUCOSE, CAPILLARY
Glucose-Capillary: 95 mg/dL (ref 70–99)
Glucose-Capillary: 146 mg/dL — ABNORMAL HIGH (ref 70–99)
Glucose-Capillary: 115 mg/dL — ABNORMAL HIGH (ref 70–99)
Glucose-Capillary: 154 mg/dL — ABNORMAL HIGH (ref 70–99)

## 2019-03-14 LAB — APTT: aPTT: 37 seconds — ABNORMAL HIGH (ref 24–36)

## 2019-03-14 LAB — FOLATE: Folate: 9.7 ng/mL (ref >5.9)

## 2019-03-14 LAB — RETICULOCYTES
Immature Retic Fract: 14.4 % (ref 2.3–15.9)
RBC.: 2.45 MIL/uL — ABNORMAL LOW (ref 3.87–5.11)
Retic Count, Absolute: 16.4 10*3/uL — ABNORMAL LOW (ref 19.0–186.0)
Retic Ct Pct: 0.7 % (ref 0.4–3.1)

## 2019-03-14 LAB — COMPREHENSIVE METABOLIC PANEL
ALT: 9 U/L (ref 0–44)
AST: 13 U/L — ABNORMAL LOW (ref 15–41)
Albumin: 1.4 g/dL — ABNORMAL LOW (ref 3.5–5.0)
Alkaline Phosphatase: 94 U/L (ref 38–126)
Anion gap: 9 (ref 5–15)
BUN: 26 mg/dL — ABNORMAL HIGH (ref 8–23)
CO2: 21 mmol/L — ABNORMAL LOW (ref 22–32)
Calcium: 8 mg/dL — ABNORMAL LOW (ref 8.9–10.3)
Chloride: 114 mmol/L — ABNORMAL HIGH (ref 98–111)
Creatinine, Ser: 2.32 mg/dL — ABNORMAL HIGH (ref 0.44–1.00)
GFR calc Af Amer: 22 mL/min — ABNORMAL LOW (ref >60)
GFR calc non Af Amer: 19 mL/min — ABNORMAL LOW (ref >60)
Glucose, Bld: 106 mg/dL — ABNORMAL HIGH (ref 70–99)
Potassium: 3.7 mmol/L (ref 3.5–5.1)
Sodium: 144 mmol/L (ref 135–145)
Total Bilirubin: 0.6 mg/dL (ref 0.3–1.2)
Total Protein: 4.6 g/dL — ABNORMAL LOW (ref 6.5–8.1)

## 2019-03-14 LAB — VITAMIN B12: Vitamin B-12: 737 pg/mL (ref 180–914)

## 2019-03-14 LAB — C-REACTIVE PROTEIN: CRP: 7.2 mg/dL — ABNORMAL HIGH (ref <1.0)

## 2019-03-14 LAB — D-DIMER, QUANTITATIVE: D-Dimer, Quant: 10.76 ug/mL-FEU — ABNORMAL HIGH (ref 0.00–0.50)

## 2019-03-14 LAB — BRAIN NATRIURETIC PEPTIDE: B Natriuretic Peptide: 572.1 pg/mL — ABNORMAL HIGH (ref 0.0–100.0)

## 2019-03-14 LAB — FERRITIN: Ferritin: 2380 ng/mL — ABNORMAL HIGH (ref 11–307)

## 2019-03-14 MED ORDER — FAMOTIDINE 20 MG PO TABS
40.0000 mg | ORAL_TABLET | Freq: Two times a day (BID) | ORAL | Status: DC
  Administered 2019-03-14 – 2019-03-16 (×5): 40 mg via ORAL
  Filled 2019-03-14 (×5): qty 2

## 2019-03-14 NOTE — Progress Notes (Signed)
PROGRESS NOTE                                                                                                                                                                                                             Patient Demographics:    [name], is a 79 y.o. female, DOB - [birth year], YQM:[id number]  Outpatient Primary MD for the patient is Jonathon Jordan, MD    LOS - 4  Admit date - 03/09/2019    Chief Complaint  Patient presents with  . Altered Mental Status       Brief Narrative   [name] is a 79 y.o. female with PMH hypothyroidism, hx of craniectomy(for frontal temporary brain meningioma resection), CKD 4, rheumatoid arthritis, DM chronic anemia, chronic pain, recent COVID infection for which she was treated to [facility] and discharged few weeks ago, aortic stenosis.  Who lives at home and lately was having some diarrhea and generalized weakness, had multiple falls at home around 3-4 and thereafter became increasingly confused.  Was brought to the ER where she was found to have low-grade fever, had toxic and metabolic encephalopathy, dehydration, neurology was consulted.  She was started on empiric treatment for meningitis and admitted to the hospital.  Further work-up revealed that she simply was having seizures in response to severe colitis found on CT scan and further work-up.  She was started on Keppra mentation has improved, she is on Rocephin and Flagyl for colitis.  No diarrhea.  Clinically much better.  Of note patient was never requiring Covid 19 quarantine as she was admitted beyond 21 days of initial Covid diagnosis.  Family and patient wished to be moved out of Covid unit.  Will be transferred off of Covid unit on 03/13/2019 so that family can visit her.   Subjective:   Seen in bed, much more alert awake today, having breakfast, appears to be in no distress, denies any chest pain or shortness  of breath, does have some abdominal discomfort.  No diarrhea.  No blood in stool or dark stool, no nausea.    Assessment  & Plan :    1.  Toxic and metabolic encephalopathy.  Multiple falls at home, reported headache prior to admission.  Low-grade fever.  Seen by neurology, head CT unremarkable, MRI brain and LP unremarkable, EEG suspicious for epileptiform focus  hence she has been placed on Keppra.  Patient is serially improving and now she is close to baseline, no focal deficits, appears to have toxic encephalopathy with possible seizures in response to severe colitis found on CT scan, see #12 below.  2.  Recent COVID-19 pneumonia.  Fully treated.  She is out of quarantine period.   SpO2: 100 %  Recent Labs  Lab 03/10/19 0923 03/11/19 0747 03/12/19 0245 03/13/19 0221 03/14/19 0456  CRP 25.7* 30.1* 24.9* 15.3* 7.2*  DDIMER 11.89* 14.23* 17.05* 15.33* 10.76*  BNP 1,628.4* 347.6* 182.1* 307.2* 572.1*    Hepatic Function Latest Ref Rng & Units 03/14/2019 03/13/2019 03/12/2019  Total Protein 6.5 - 8.1 g/dL 4.6(L) 4.9(L) 4.9(L)  Albumin 3.5 - 5.0 g/dL 1.4(L) 1.5(L) 1.4(L)  AST 15 - 41 U/L 13(L) 15 13(L)  ALT 0 - 44 U/L 9 10 11   Alk Phosphatase 38 - 126 U/L 94 92 94  Total Bilirubin 0.3 - 1.2 mg/dL 0.6 0.8 0.8    3.  CKD 4.  Baseline creatinine around 2.6.  Back to baseline after mild hydration.  4.  Urinary retention.  Flomax and Foley placed on the day of admission.  Foley can be removed after 6 to 7 days of placement after enough bladder rest.  5.  Hypothyroidism.  Check TSH and cortisol, IV Synthroid.  Lab Results  Component Value Date   TSH 0.857 03/10/2019    6.  History of meningioma with craniotomy in the past.  Supportive care.  Head CT nonacute.  7.  Dyslipidemia.  Once taking oral medications consistently will start home dose statin.  8.  History of moderate to severe AS.  Supportive care.  No acute issues.  Will require outpatient cardiology follow-up.  Could be a  TAVR candidate.  9.  Elevated D-dimer with history of DVT.  Negative lower extremity venous duplex, on high-dose heparin.  10.  Anemia of chronic disease, slightly worse with heme dilution, hold further fluids, gentle hydration, placed on oral iron, no signs of active bleeding, has some abdominal soft tissue hematoma at the sites of heparin/Lovenox, hold chemical prophylaxis, check anemia panel, if needed will give IV iron and Epogen.  Patient and family do not want any blood transfusions due to religious beliefs.  11. Hyperkalemia and Hypomagnesemia - treated.  12.  CT evidence of severe colitis found during her fever work-up on 03/12/2019.  History of diverticulitis in the past with surgical repair done at [facility] several years ago.  She did have diarrhea prior to hospital admission, no diarrhea reported now, currently placed on Rocephin and Flagyl with excellent clinical improvement, fevers have subsided, CRP is trending down, mentation has improved, has been placed on Soft diet.  As discussed with ID and GI, plan is to continue present antibiotics, repeat imaging in 3 to 4 days.  If imaging shows sustained worsening then she might require colonoscopy.   Condition - Extremely Guarded  Family Communication  : Daughter updated in detail on 03/10/2019, updated again on 03/11/2019, 03/12/2019, 03/13/2019, 03/14/2019  Code Status : No intubation  Diet :    Diet Order            DIET DYS 3 Room service appropriate? Yes; Fluid consistency: Thin  Diet effective now               Disposition Plan  : Patient in PCU getting worked up for encephalopathy, sepsis now appears to be secondary to severe colitis.  Getting treatment for colitis.  Consults  : Neurology, ID over the phone Dr. Graylon Good on 03/13/2019, GI  Procedures  :    CT head.  Nonacute.  EEG.  Final - This study showed evidence of epileptogenicity and cortical dysfunction in right frontocentral region. No seizures were seen throughout the  recording.  Lower extremity venous duplex.  No DVT.  LP - 1 wbc  CT abdomen pelvis noncontrast- 1. Findings are concerning for severe colitis, as above. 2. Findings in the lungs which likely reflect evolving post infectious or inflammatory scarring given the patient's history of recent COVID-19 infection. 3. Aortic atherosclerosis, in addition to left main and 3 vessel coronary artery disease. 4. There are calcifications of the aortic valve. Echocardiographic correlation for evaluation of potential valvular dysfunction may be warranted if clinically indicated. 5. Additional incidental findings, as above.  MRI brain.  Nonacute.    PUD Prophylaxis : none  DVT Prophylaxis  :    Heparin switched to SCDs on 03/14/2019 due to continued anemia and abdominal soft tissue hematomas.  Lab Results  Component Value Date   PLT 303 03/14/2019    Inpatient Medications  Scheduled Meds: . famotidine  40 mg Oral BID  . ferrous sulfate  325 mg Oral TID WC  . insulin aspart  0-9 Units Subcutaneous TID WC  . levETIRAcetam  500 mg Oral BID  . levothyroxine  150 mcg Oral Q0600  . tamsulosin  0.4 mg Oral Daily   Continuous Infusions: . cefTRIAXone (ROCEPHIN)  IV Stopped (03/13/19 1221)  . metronidazole 500 mg (03/14/19 1108)   PRN Meds:.acetaminophen, albuterol  Antibiotics  :    Anti-infectives (From admission, onward)   Start     Dose/Rate Route Frequency Ordered Stop   03/12/19 1200  metroNIDAZOLE (FLAGYL) IVPB 500 mg     500 mg 100 mL/hr over 60 Minutes Intravenous Every 8 hours 03/12/19 1048     03/12/19 1200  cefTRIAXone (ROCEPHIN) 2 g in sodium chloride 0.9 % 100 mL IVPB     2 g 200 mL/hr over 30 Minutes Intravenous Every 24 hours 03/12/19 1049 03/15/19 1159   03/12/19 1033  vancomycin variable dose per unstable renal function (pharmacist dosing)  Status:  Discontinued      Does not apply See admin instructions 03/12/19 1033 03/12/19 1048   03/11/19 2300  meropenem (MERREM) 1 g in sodium  chloride 0.9 % 100 mL IVPB  Status:  Discontinued     1 g 200 mL/hr over 30 Minutes Intravenous Every 12 hours 03/11/19 2217 03/12/19 1048   03/11/19 1130  vancomycin (VANCOCIN) IVPB 1000 mg/200 mL premix  Status:  Discontinued     1,000 mg 200 mL/hr over 60 Minutes Intravenous Every 48 hours 03/11/19 1032 03/12/19 1033   03/11/19 1130  fluconazole (DIFLUCAN) IVPB 100 mg     100 mg 50 mL/hr over 60 Minutes Intravenous  Once 03/11/19 1035 03/12/19 0943   03/11/19 0600  acyclovir (ZOVIRAX) 800 mg in dextrose 5 % 150 mL IVPB  Status:  Discontinued     800 mg 166 mL/hr over 60 Minutes Intravenous Every 24 hours 03/10/19 1234 03/11/19 1715   03/10/19 1600  meropenem (MERREM) 1 g in sodium chloride 0.9 % 100 mL IVPB  Status:  Discontinued     1 g 200 mL/hr over 30 Minutes Intravenous Every 12 hours 03/10/19 1247 03/11/19 2217   03/10/19 0032  vancomycin variable dose per unstable renal function (pharmacist dosing)  Status:  Discontinued      Does not  apply See admin instructions 03/10/19 0032 03/11/19 1032   03/09/19 2330  acyclovir (ZOVIRAX) 800 mg in dextrose 5 % 150 mL IVPB  Status:  Discontinued     800 mg 166 mL/hr over 60 Minutes Intravenous Every 8 hours 03/09/19 2321 03/10/19 1234   03/09/19 2330  meropenem (MERREM) 1 g in sodium chloride 0.9 % 100 mL IVPB  Status:  Discontinued     1 g 200 mL/hr over 30 Minutes Intravenous Every 8 hours 03/09/19 2324 03/10/19 1247   03/09/19 2330  vancomycin (VANCOREADY) IVPB 1500 mg/300 mL     1,500 mg 150 mL/hr over 120 Minutes Intravenous  Once 03/09/19 2324 03/10/19 0601       Time Spent in minutes  30   Lala Lund M.D on 03/14/2019 at 11:20 AM  To page go to [email or web address] - password [email or web address]  [facility] -  Office  [phone]    See all Orders from today for further details    Objective:   Vitals:   03/13/19 1458 03/13/19 2037 03/14/19 0442 03/14/19 0917  BP: (!) 141/66 137/61 (!) 139/59 (!) 136/48  Pulse: 78 74 70 72    Resp: 18 15 15 18   Temp: 98 F (36.7 C) 98.2 F (36.8 C) 98.3 F (36.8 C) 98.2 F (36.8 C)  TempSrc: Oral Oral  Oral  SpO2: 100% 97% 97% 100%  Weight:      Height:        Wt Readings from Last 3 Encounters:  03/13/19 80.8 kg  02/27/19 88 kg  01/12/19 82.1 kg     Intake/Output Summary (Last 24 hours) at 03/14/2019 1120 Last data filed at 03/14/2019 0900 Gross per 24 hour  Intake 680 ml  Output 950 ml  Net -270 ml     Physical Exam  Awake Alert, No new F.N deficits, in no distress [place].AT,PERRAL Supple Neck,No JVD, No cervical lymphadenopathy appriciated.  Symmetrical Chest wall movement, Good air movement bilaterally, CTAB RRR,No Gallops, Rubs or new Murmurs, No Parasternal Heave +ve B.Sounds, Abd Soft but mildly tender today, few soft tissue bruises at the site of previous DVT prophylaxis injection sites No Cyanosis, Clubbing or edema,  No new Rash or bruise      Data Review:    CBC Recent Labs  Lab 03/09/19 1944 03/11/19 0747 03/12/19 0245 03/13/19 0221 03/14/19 0456  WBC 8.2 13.6* 11.8* 12.5* 8.4  HGB 7.8* 8.6* 7.5* 7.1* 6.7*  HCT 25.2* 26.7* 23.2* 21.5* 20.7*  PLT 210 288 298 295 303  MCV 94.4 90.2 88.9 88.1 88.8  MCH 29.2 29.1 28.7 29.1 28.8  MCHC 31.0 32.2 32.3 33.0 32.4  RDW 18.3* 18.2* 18.2* 17.8* 17.9*  LYMPHSABS 1.7 1.3 1.2 1.5 1.8  MONOABS 0.5 0.4 0.5 0.5 0.3  EOSABS 0.4 0.2 0.5 0.6* 0.5  BASOSABS 0.0 0.1 0.0 0.0 0.0    Chemistries  Recent Labs  Lab 03/10/19 0923 03/10/19 0923 03/10/19 1846 03/11/19 0747 03/12/19 0245 03/13/19 0221 03/14/19 0456  NA 139  --   --  143 143 144 144  K 6.0*   < > 4.7 4.1 3.7 4.1 3.7  CL 112*  --   --  112* 112* 114* 114*  CO2 13*  --   --  18* 21* 21* 21*  GLUCOSE 98  --   --  161* 127* 123* 106*  BUN 19  --   --  23 27* 27* 26*  CREATININE 2.78*  --   --  2.94* 2.94* 2.55* 2.32*  CALCIUM 7.8*  --   --  8.1* 8.2* 8.1* 8.0*  MG 1.4*  --   --  2.0 2.0 1.9 1.7  AST  --   --   --  16 13* 15 13*  ALT   --   --   --  12 11 10 9   ALKPHOS  --   --   --  105 94 92 94  BILITOT  --   --   --  0.7 0.8 0.8 0.6   < > = values in this interval not displayed.   ------------------------------------------------------------------------------------------------------------------ No results for input(s): CHOL, HDL, LDLCALC, TRIG, CHOLHDL, LDLDIRECT in the last 72 hours.  Lab Results  Component Value Date   HGBA1C 6.6 (H) 02/15/2019   ------------------------------------------------------------------------------------------------------------------ No results for input(s): TSH, T4TOTAL, T3FREE, THYROIDAB in the last 72 hours.  Invalid input(s): FREET3  Cardiac Enzymes No results for input(s): CKMB, TROPONINI, MYOGLOBIN in the last 168 hours.  Invalid input(s): CK ------------------------------------------------------------------------------------------------------------------    Component Value Date/Time   BNP 572.1 (H) 03/14/2019 0456    Micro Results Recent Results (from the past 240 hour(s))  Blood Culture (routine x 2)     Status: None   Collection Time: 03/09/19  7:44 PM   Specimen: BLOOD  Result Value Ref Range Status   Specimen Description BLOOD RIGHT ANTECUBITAL  Final   Special Requests   Final    BOTTLES DRAWN AEROBIC AND ANAEROBIC Blood Culture results may not be optimal due to an inadequate volume of blood received in culture bottles   Culture   Final    NO GROWTH 5 DAYS Performed at [facility], [place] [address] [place], [place] [address]    Report Status 03/14/2019 FINAL  Final  Blood Culture (routine x 2)     Status: None   Collection Time: 03/09/19  7:49 PM   Specimen: BLOOD  Result Value Ref Range Status   Specimen Description BLOOD LEFT ANTECUBITAL  Final   Special Requests   Final    BOTTLES DRAWN AEROBIC AND ANAEROBIC Blood Culture results may not be optimal due to an inadequate volume of blood received in culture bottles   Culture   Final    NO GROWTH 5  DAYS Performed at [facility], [place] [address], [place], [place] [address]    Report Status 03/14/2019 FINAL  Final  Urine culture     Status: Abnormal   Collection Time: 03/09/19  8:00 PM   Specimen: In/Out Cath Urine  Result Value Ref Range Status   Specimen Description IN/OUT CATH URINE  Final   Special Requests   Final    NONE Performed at [facility], [place] [address], [place], [place] [address]    Culture >=100,000 COLONIES/mL YEAST (A)  Final   Report Status 03/10/2019 FINAL  Final  CSF culture     Status: None   Collection Time: 03/11/19  2:48 PM   Specimen: PATH Cytology CSF; Cerebrospinal Fluid  Result Value Ref Range Status   Specimen Description CSF  Final   Special Requests NONE  Final   Gram Stain   Final    WBC PRESENT,BOTH PMN AND MONONUCLEAR NO ORGANISMS SEEN CYTOSPIN SMEAR    Culture   Final    NO GROWTH 3 DAYS Performed at [facility], [place] [address], [place], [place] [address]    Report Status 03/14/2019 FINAL  Final  MRSA PCR Screening     Status: None  Collection Time: 03/12/19  9:29 AM   Specimen: Nasal Mucosa; Nasopharyngeal  Result Value Ref Range Status   MRSA by PCR NEGATIVE NEGATIVE Final    Comment:        The GeneXpert MRSA Assay (FDA approved for NASAL specimens only), is one component of a comprehensive MRSA colonization surveillance program. It is not intended to diagnose MRSA infection nor to guide or monitor treatment for MRSA infections. Performed at [facility], [place] [address] [place], [place] [address]     Radiology Reports CT ABDOMEN PELVIS WO CONTRAST  Result Date: 03/12/2019 CLINICAL DATA:  79 year old female with history of COVID-19 pneumonia. Generalized weakness and diarrhea. EXAM: CT CHEST, ABDOMEN AND PELVIS WITHOUT CONTRAST TECHNIQUE: Multidetector CT imaging of the chest, abdomen and pelvis was performed following the standard protocol without IV contrast. COMPARISON:  Chest CT 08/05/2016.   CT the abdomen 01/27/2006. FINDINGS: CT CHEST FINDINGS Cardiovascular: Heart size is normal. There is no significant pericardial fluid, thickening or pericardial calcification. There is aortic atherosclerosis, as well as atherosclerosis of the great vessels of the mediastinum and the coronary arteries, including calcified atherosclerotic plaque in the left main, left anterior descending, left circumflex and right coronary arteries. Severe calcifications of the aortic valve. Mediastinum/Nodes: No pathologically enlarged mediastinal or hilar lymph nodes. Please note that accurate exclusion of hilar adenopathy is limited on noncontrast CT scans. Esophagus is unremarkable in appearance. No axillary lymphadenopathy. Lungs/Pleura: Patchy areas of ground-glass attenuation, septal thickening, thickening of the peribronchovascular interstitium and regional architectural distortion in the mid to lower lungs bilaterally, likely to reflect mild post infectious or inflammatory scarring in this patient with history of recent COVID-19 infection. Trace bilateral pleural effusions lying dependently. No suspicious appearing pulmonary nodules or masses are noted. Musculoskeletal: There are no aggressive appearing lytic or blastic lesions noted in the visualized portions of the skeleton. CT ABDOMEN PELVIS FINDINGS Hepatobiliary: 1.4 cm low-attenuation lesion in segment 4A of the liver, incompletely characterized on today's non-contrast CT examination, but similar to the prior study and most compatible with a cyst. No other suspicious hepatic lesions. Unenhanced appearance of the gallbladder is unremarkable. Pancreas: No definite pancreatic mass or peripancreatic fluid collections or inflammatory changes are noted on today's noncontrast CT examination. Spleen: Unremarkable. Adrenals/Urinary Tract: Exophytic 2.4 cm low-attenuation lesion in the lower pole of the left kidney, incompletely characterized on today's noncontrast CT  examination but statistically likely to represent a cyst. Unenhanced appearance of the right kidney and bilateral adrenal glands are normal in appearance. No hydroureteronephrosis. Urinary bladder is nearly decompressed with an indwelling Foley balloon catheter. Small amount of gas in the non dependent portion of the urinary bladder is iatrogenic. Stomach/Bowel: Unenhanced appearance of the stomach is normal. No pathologic dilatation of small bowel or colon. Suture line near the rectosigmoid junction from prior partial bowel resection. Adjacent haziness and soft tissue stranding in the presacral soft tissues may be related to prior radiation therapy. Extensive mural thickening throughout the colon most severe from the transverse colon to the proximal sigmoid colon where there are surrounding inflammatory changes, compatible with a colitis. Vascular/Lymphatic: Aortic atherosclerosis. No lymphadenopathy noted in the abdomen or pelvis. Reproductive: Status post hysterectomy. Ovaries are not confidently identified may be surgically absent or atrophic. Other: No significant volume of ascites.  No pneumoperitoneum. Musculoskeletal: There are no aggressive appearing lytic or blastic lesions noted in the visualized portions of the skeleton. IMPRESSION: 1. Findings are concerning for severe colitis, as above. 2. Findings in the lungs  which likely reflect evolving post infectious or inflammatory scarring given the patient's history of recent COVID-19 infection. 3. Aortic atherosclerosis, in addition to left main and 3 vessel coronary artery disease. 4. There are calcifications of the aortic valve. Echocardiographic correlation for evaluation of potential valvular dysfunction may be warranted if clinically indicated. 5. Additional incidental findings, as above. Electronically Signed   By: Vinnie Langton M.D.   On: 03/12/2019 09:49   CT Head Wo Contrast  Result Date: 03/09/2019 CLINICAL DATA:  Altered mental status and  increasing confusion, history of COVID-19 positivity EXAM: CT HEAD WITHOUT CONTRAST TECHNIQUE: Contiguous axial images were obtained from the base of the skull through the vertex without intravenous contrast. COMPARISON:  02/10/2018 FINDINGS: Brain: Mild atrophic changes and chronic white matter ischemic changes are seen stable from the prior exam. No findings to suggest acute hemorrhage, acute infarction or space-occupying mass lesion are noted. Vascular: No hyperdense vessel or unexpected calcification. Skull: Changes consistent with prior craniotomy are noted in right temporoparietal region Sinuses/Orbits: No acute finding. Other: None. IMPRESSION: Chronic atrophic and ischemic changes without acute abnormality. Electronically Signed   By: Inez Catalina M.D.   On: 03/09/2019 21:10   CT CHEST WO CONTRAST  Result Date: 03/12/2019 CLINICAL DATA:  79 year old female with history of COVID-19 pneumonia. Generalized weakness and diarrhea. EXAM: CT CHEST, ABDOMEN AND PELVIS WITHOUT CONTRAST TECHNIQUE: Multidetector CT imaging of the chest, abdomen and pelvis was performed following the standard protocol without IV contrast. COMPARISON:  Chest CT 08/05/2016.  CT the abdomen 01/27/2006. FINDINGS: CT CHEST FINDINGS Cardiovascular: Heart size is normal. There is no significant pericardial fluid, thickening or pericardial calcification. There is aortic atherosclerosis, as well as atherosclerosis of the great vessels of the mediastinum and the coronary arteries, including calcified atherosclerotic plaque in the left main, left anterior descending, left circumflex and right coronary arteries. Severe calcifications of the aortic valve. Mediastinum/Nodes: No pathologically enlarged mediastinal or hilar lymph nodes. Please note that accurate exclusion of hilar adenopathy is limited on noncontrast CT scans. Esophagus is unremarkable in appearance. No axillary lymphadenopathy. Lungs/Pleura: Patchy areas of ground-glass  attenuation, septal thickening, thickening of the peribronchovascular interstitium and regional architectural distortion in the mid to lower lungs bilaterally, likely to reflect mild post infectious or inflammatory scarring in this patient with history of recent COVID-19 infection. Trace bilateral pleural effusions lying dependently. No suspicious appearing pulmonary nodules or masses are noted. Musculoskeletal: There are no aggressive appearing lytic or blastic lesions noted in the visualized portions of the skeleton. CT ABDOMEN PELVIS FINDINGS Hepatobiliary: 1.4 cm low-attenuation lesion in segment 4A of the liver, incompletely characterized on today's non-contrast CT examination, but similar to the prior study and most compatible with a cyst. No other suspicious hepatic lesions. Unenhanced appearance of the gallbladder is unremarkable. Pancreas: No definite pancreatic mass or peripancreatic fluid collections or inflammatory changes are noted on today's noncontrast CT examination. Spleen: Unremarkable. Adrenals/Urinary Tract: Exophytic 2.4 cm low-attenuation lesion in the lower pole of the left kidney, incompletely characterized on today's noncontrast CT examination but statistically likely to represent a cyst. Unenhanced appearance of the right kidney and bilateral adrenal glands are normal in appearance. No hydroureteronephrosis. Urinary bladder is nearly decompressed with an indwelling Foley balloon catheter. Small amount of gas in the non dependent portion of the urinary bladder is iatrogenic. Stomach/Bowel: Unenhanced appearance of the stomach is normal. No pathologic dilatation of small bowel or colon. Suture line near the rectosigmoid junction from prior partial bowel resection. Adjacent haziness and  soft tissue stranding in the presacral soft tissues may be related to prior radiation therapy. Extensive mural thickening throughout the colon most severe from the transverse colon to the proximal sigmoid colon  where there are surrounding inflammatory changes, compatible with a colitis. Vascular/Lymphatic: Aortic atherosclerosis. No lymphadenopathy noted in the abdomen or pelvis. Reproductive: Status post hysterectomy. Ovaries are not confidently identified may be surgically absent or atrophic. Other: No significant volume of ascites.  No pneumoperitoneum. Musculoskeletal: There are no aggressive appearing lytic or blastic lesions noted in the visualized portions of the skeleton. IMPRESSION: 1. Findings are concerning for severe colitis, as above. 2. Findings in the lungs which likely reflect evolving post infectious or inflammatory scarring given the patient's history of recent COVID-19 infection. 3. Aortic atherosclerosis, in addition to left main and 3 vessel coronary artery disease. 4. There are calcifications of the aortic valve. Echocardiographic correlation for evaluation of potential valvular dysfunction may be warranted if clinically indicated. 5. Additional incidental findings, as above. Electronically Signed   By: Vinnie Langton M.D.   On: 03/12/2019 09:49   MR BRAIN WO CONTRAST  Result Date: 03/11/2019 CLINICAL DATA:  Encephalopathy, history of meningioma resection EXAM: MRI HEAD WITHOUT CONTRAST TECHNIQUE: Multiplanar, multiecho pulse sequences of the brain and surrounding structures were obtained without intravenous contrast. COMPARISON:  2018 FINDINGS: Motion artifact is present. Brain: There is no acute infarction or intracranial hemorrhage. There is no intracranial mass, mass effect, or edema. There is no hydrocephalus or extra-axial fluid collection. Patchy T2 hyperintensity in the supratentorial white matter is nonspecific but probably reflects stable chronic microvascular ischemic changes. Vascular: Major vessel flow voids at the skull base are preserved. Skull and upper cervical spine: Right frontal craniotomy. Normal marrow signal is preserved. Sinuses/Orbits: Paranasal sinuses are aerated. No  significant orbital abnormality. Other: Sella is unremarkable.  Mastoid air cells are clear. IMPRESSION: Motion degraded. No acute infarction or other acute abnormality. Stable chronic microvascular ischemic changes. Electronically Signed   By: Macy Mis M.D.   On: 03/11/2019 13:03   US RENAL  Result Date: 02/16/2019 CLINICAL DATA:  Acute kidney injury. EXAM: RENAL / URINARY TRACT ULTRASOUND COMPLETE COMPARISON:  None. FINDINGS: Right Kidney: Renal measurements: 8.7 x 5.3 x 5.2 cm = volume: 123 mL. 1.6 cm cyst is noted in upper pole. Increased echogenicity of renal parenchyma is noted. No mass or hydronephrosis visualized. Left Kidney: Renal measurements: 10.4 x 5.8 x 5.8 cm = volume: 183 mL. 2.1 cm cyst is seen in lower pole. Increased echogenicity of renal parenchyma is noted. No mass or hydronephrosis visualized. Bladder: Appears normal for degree of bladder distention. Other: None. IMPRESSION: Increased echogenicity of renal parenchyma is noted bilaterally consistent with medical renal disease. No hydronephrosis or renal obstruction is noted. Electronically Signed   By: Marijo Conception M.D.   On: 02/16/2019 09:43   DG Chest Portable 1 View  Result Date: 03/09/2019 CLINICAL DATA:  Altered mental status. Possible pulmonary infection. EXAM: PORTABLE CHEST 1 VIEW COMPARISON:  February 14, 2019. FINDINGS: Stable mild cardiomegaly. Central pulmonary vascular congestion without overt pulmonary edema or obvious pleural effusion. Low lung volumes. Linear atelectasis in the left lower lung zone. No pneumonia. Moderate skeletal degenerative change and telemetry leads. IMPRESSION: Stable mild cardiomegaly with central pulmonary vascular congestion. No pneumonia or pulmonary edema. Electronically Signed   By: Revonda Humphrey   On: 03/09/2019 21:41   DG Chest Port 1 View  Result Date: 02/14/2019 CLINICAL DATA:  Sepsis EXAM: PORTABLE CHEST 1 VIEW  COMPARISON:  02/10/2018 FINDINGS: Heart and mediastinal contours are  within normal limits. No focal opacities or effusions. No acute bony abnormality. IMPRESSION: No active disease. Electronically Signed   By: Rolm Baptise M.D.   On: 02/14/2019 18:29   Overnight EEG with video  Result Date: 03/10/2019 Lora Havens, MD     03/11/2019  9:03 AM Patient Name: AISLEY WHAN MRN: [id number] Epilepsy Attending: Lora Havens Referring Physician/Provider: Dr Roland Rack Duration: 03/09/2019 2308 to 03/10/2019 2308 Patient history: 79 year old female presenting with polymyoclonus, fever of unclear etiology and altered mental status. EEG to evaluate for seizure. Level of alertness: awake/lethargic AEDs during EEG study: None Technical aspects: This EEG study was done with scalp electrodes positioned according to the 10-20 International system of electrode placement. Electrical activity was acquired at a sampling rate of 500Hz  and reviewed with a high frequency filter of 70Hz  and a low frequency filter of 1Hz . EEG data were recorded continuously and digitally stored. DESCRIPTION: No clear posterior dominant rhythm was seen. EEG showed continuous generalized and maximal right frontocentral region. Sharp waves were also seen in right frontocentral region. Hyperventilation and photic stimulation were not performed. Event button was pressed on 03/10/2019 at 1016 for unclear reason. Cocomitant eeg before, during and after the event didn't show any eeg change to suggest seizure. 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 No seizures were seen throughout the recording. Event button was pressed on 03/10/2019 at 1016 for unclear reason. Cocomitant eeg before, during and after the event didn't show any eeg change to suggest seizure. Priyanka O Yadav   VAS [place] LOWER EXTREMITY VENOUS (DVT)  Result Date:  03/10/2019  Lower Venous DVTStudy Indications: Elevated Ddimer.  Risk Factors: COVID 19 positive. Comparison Study: No prior studies. Performing Technologist: Oliver Hum RVT  Examination Guidelines: A complete evaluation includes B-mode imaging, spectral Doppler, color Doppler, and power Doppler as needed of all accessible portions of each vessel. Bilateral testing is considered an integral part of a complete examination. Limited examinations for reoccurring indications may be performed as noted. The reflux portion of the exam is performed with the patient in reverse Trendelenburg.  +---------+---------------+---------+-----------+----------+--------------+ RIGHT    CompressibilityPhasicitySpontaneityPropertiesThrombus Aging +---------+---------------+---------+-----------+----------+--------------+ CFV      Full           Yes      Yes                                 +---------+---------------+---------+-----------+----------+--------------+ SFJ      Full                                                        +---------+---------------+---------+-----------+----------+--------------+ FV Prox  Full                                                        +---------+---------------+---------+-----------+----------+--------------+ FV Mid   Full                                                        +---------+---------------+---------+-----------+----------+--------------+  FV DistalFull                                                        +---------+---------------+---------+-----------+----------+--------------+ PFV      Full                                                        +---------+---------------+---------+-----------+----------+--------------+ POP      Full           Yes      Yes                                 +---------+---------------+---------+-----------+----------+--------------+ PTV      Full                                                         +---------+---------------+---------+-----------+----------+--------------+ PERO     Full                                                        +---------+---------------+---------+-----------+----------+--------------+   +---------+---------------+---------+-----------+----------+--------------+ LEFT     CompressibilityPhasicitySpontaneityPropertiesThrombus Aging +---------+---------------+---------+-----------+----------+--------------+ CFV      Full           Yes      Yes                                 +---------+---------------+---------+-----------+----------+--------------+ SFJ      Full                                                        +---------+---------------+---------+-----------+----------+--------------+ FV Prox  Full                                                        +---------+---------------+---------+-----------+----------+--------------+ FV Mid   Full                                                        +---------+---------------+---------+-----------+----------+--------------+ FV Distal               Yes      Yes                                 +---------+---------------+---------+-----------+----------+--------------+  PFV      Full                                                        +---------+---------------+---------+-----------+----------+--------------+ POP      Full           Yes      Yes                                 +---------+---------------+---------+-----------+----------+--------------+ PTV      Full                                                        +---------+---------------+---------+-----------+----------+--------------+ PERO     Full                                                        +---------+---------------+---------+-----------+----------+--------------+     Summary: RIGHT: - There is no evidence of deep vein thrombosis in the lower extremity. However, portions of  this examination were limited- see technologist comments above.  - No cystic structure found in the popliteal fossa.  LEFT: - There is no evidence of deep vein thrombosis in the lower extremity. However, portions of this examination were limited- see technologist comments above.  - No cystic structure found in the popliteal fossa.  *See table(s) above for measurements and observations. Electronically signed by Servando Snare MD on 03/10/2019 at 4:08:34 PM.    Final    VAS [place] LOWER EXTREMITY VENOUS (DVT)  Result Date: 02/22/2019  Lower Venous DVTStudy Indications: Hx of DVT.  Limitations: Body habitus and sunlight in the room, uncooperative patient. Performing Technologist: Antonieta Pert RDMS, RVT  Examination Guidelines: A complete evaluation includes B-mode imaging, spectral Doppler, color Doppler, and power Doppler as needed of all accessible portions of each vessel. Bilateral testing is considered an integral part of a complete examination. Limited examinations for reoccurring indications may be performed as noted. The reflux portion of the exam is performed with the patient in reverse Trendelenburg.  +---------+---------------+---------+-----------+----------+-------------------+ RIGHT    CompressibilityPhasicitySpontaneityPropertiesThrombus Aging      +---------+---------------+---------+-----------+----------+-------------------+ CFV      Full           Yes      Yes                                      +---------+---------------+---------+-----------+----------+-------------------+ SFJ      Full                                                             +---------+---------------+---------+-----------+----------+-------------------+ FV Prox  Full                                                             +---------+---------------+---------+-----------+----------+-------------------+  FV Mid   Full                                                              +---------+---------------+---------+-----------+----------+-------------------+ FV DistalFull                                                             +---------+---------------+---------+-----------+----------+-------------------+ PFV      Full                                                             +---------+---------------+---------+-----------+----------+-------------------+ POP                                                   Not visualized      +---------+---------------+---------+-----------+----------+-------------------+ PTV                                                   poor visualization                                                        due to pt                                                                 cooperation and                                                           tolerance.          +---------+---------------+---------+-----------+----------+-------------------+ PERO                                                  poor visualization  due to pt                                                                 cooperation and                                                           tolerance.          +---------+---------------+---------+-----------+----------+-------------------+ GSV      Full                                                             +---------+---------------+---------+-----------+----------+-------------------+   +---------+---------------+---------+-----------+----------+-------------------+ LEFT     CompressibilityPhasicitySpontaneityPropertiesThrombus Aging      +---------+---------------+---------+-----------+----------+-------------------+ CFV      Full           Yes      Yes                                      +---------+---------------+---------+-----------+----------+-------------------+  SFJ      Full                                                             +---------+---------------+---------+-----------+----------+-------------------+ FV Prox  Full                                                             +---------+---------------+---------+-----------+----------+-------------------+ FV Mid   Full                                                             +---------+---------------+---------+-----------+----------+-------------------+ FV Distal                                             Not visualized      +---------+---------------+---------+-----------+----------+-------------------+ PFV      Full                                                             +---------+---------------+---------+-----------+----------+-------------------+  POP                     Yes      Yes                  unable to compress                                                        due to pt                                                                 cooperation and                                                           tolerance.          +---------+---------------+---------+-----------+----------+-------------------+ PTV                                                   not well visualized +---------+---------------+---------+-----------+----------+-------------------+ PERO                                                  not well visualized +---------+---------------+---------+-----------+----------+-------------------+ GSV      Full                                                             +---------+---------------+---------+-----------+----------+-------------------+     Summary: RIGHT: - There is no evidence of deep vein thrombosis in the lower extremity. However, portions of this examination were limited- see technologist comments above.  - A cystic structure is found in the popliteal fossa.  LEFT: - There is  no evidence of deep vein thrombosis in the lower extremity. However, portions of this examination were limited- see technologist comments above.  - No cystic structure found in the popliteal fossa.  *See table(s) above for measurements and observations. Electronically signed by Deitra Mayo MD on 02/22/2019 at 4:12:36 PM.    Final    VAS [place] LOWER EXTREMITY VENOUS (DVT)  Result Date: 02/17/2019  Lower Venous DVTStudy Indications: Elevated ddimer.  Limitations: Body habitus, poor ultrasound/tissue interface and pain tolerance. Comparison Study: 02/10/18 previous Performing Technologist: Abram Sander RVS  Examination Guidelines: A complete evaluation includes B-mode imaging, spectral Doppler, color Doppler, and power Doppler as needed of all accessible portions of each vessel. Bilateral testing is considered an integral part of a complete examination. Limited examinations for reoccurring indications may  be performed as noted. The reflux portion of the exam is performed with the patient in reverse Trendelenburg.  +---------+---------------+---------+-----------+----------+--------------+ RIGHT    CompressibilityPhasicitySpontaneityPropertiesThrombus Aging +---------+---------------+---------+-----------+----------+--------------+ CFV      Full           Yes      Yes                                 +---------+---------------+---------+-----------+----------+--------------+ SFJ      Full                                                        +---------+---------------+---------+-----------+----------+--------------+ FV Prox  Full                                                        +---------+---------------+---------+-----------+----------+--------------+ FV Mid   Full                                                        +---------+---------------+---------+-----------+----------+--------------+ FV Distal               Yes      Yes                                  +---------+---------------+---------+-----------+----------+--------------+ PFV      Full                                                        +---------+---------------+---------+-----------+----------+--------------+ POP      Full           Yes      Yes                                 +---------+---------------+---------+-----------+----------+--------------+ PTV      Full                                                        +---------+---------------+---------+-----------+----------+--------------+ PERO                                                  Not visualized +---------+---------------+---------+-----------+----------+--------------+   +---------+---------------+---------+-----------+----------+--------------+ LEFT     CompressibilityPhasicitySpontaneityPropertiesThrombus Aging +---------+---------------+---------+-----------+----------+--------------+ CFV      Full           Yes      Yes                                 +---------+---------------+---------+-----------+----------+--------------+  SFJ      Full                                                        +---------+---------------+---------+-----------+----------+--------------+ FV Prox  Full                                                        +---------+---------------+---------+-----------+----------+--------------+ FV Mid   Full                                                        +---------+---------------+---------+-----------+----------+--------------+ FV Distal               Yes      Yes                                 +---------+---------------+---------+-----------+----------+--------------+ PFV      Full                                                        +---------+---------------+---------+-----------+----------+--------------+ POP      Full           Yes      Yes                                  +---------+---------------+---------+-----------+----------+--------------+ PTV      Full                                                        +---------+---------------+---------+-----------+----------+--------------+ PERO                                                  Not visualized +---------+---------------+---------+-----------+----------+--------------+     Summary: BILATERAL: - No evidence of deep vein thrombosis seen in the lower extremities, bilaterally.   *See table(s) above for measurements and observations. Electronically signed by Harold Barban MD on 02/17/2019 at 7:28:13 AM.    Final    DG FL GUIDED LUMBAR PUNCTURE  Result Date: 03/11/2019 CLINICAL DATA:  Mental status changes.  COVID-19 positive. EXAM: DIAGNOSTIC LUMBAR PUNCTURE UNDER FLUOROSCOPIC GUIDANCE FLUOROSCOPY TIME:  Fluoroscopy Time:  1 minutes and 24 seconds Radiation Exposure Index (if provided by the fluoroscopic device): Not applicable. Number of Acquired Spot Images: 0 PROCEDURE: Informed consent was obtained from the patient's daughter prior to the procedure, including potential complications of bleeding and infection. With the patient prone,  the lower back was prepped with Betadine. 1% Lidocaine was used for local anesthesia. Lumbar puncture was performed at the L4/5 level using a 20 gauge needle with return of initially blood tinged CSF with an opening pressure of 9 cm water. 9 ml of CSF were obtained for laboratory studies. The patient tolerated the procedure well and there were no apparent complications. IMPRESSION: Successful lumbar puncture under fluoroscopic guidance, as detailed above. Electronically Signed   By: Abigail Miyamoto M.D.   On: 03/11/2019 15:03

## 2019-03-14 NOTE — Progress Notes (Addendum)
  Speech Language Pathology Treatment: Dysphagia  Patient Details Name: [name] MRN: [id number] DOB: 04/24/1940 Today's Date: 03/14/2019 Time: 4010-2725 SLP Time Calculation (min) (ACUTE ONLY): 16 min  Assessment / Plan / Recommendation Clinical Impression  Pt encountered sitting upright in bed with lunch tray. Patient voice quality noted to be somewhat hoarse, unclear if this is new onset. Patient initially refusing PO trials, stating she was feeling nauseous. Pt then complaining of throat pain. Pt seen with straw and cup sips of thin liquid (water): patient complaining of odynophagia and noted to have immediate (weak) throat clear. Patient agreeable to bites of puree solids (applesauce): mildly prolonged AP transport, but no overt s/sx aspiration and good oral clearance. Pt denied odynophagia in 4/5 bites of puree. ST attempted further trials of solid textures, but patient refusing. Initially she cited nausea. Patient then denied nausea, stating "I don't want it." ST offered alternative items, but pt stating "I don't want anything." At the end of the session, patient denied throat pain. Patient denied globus sensation throughout session. D/w RN re: patient reports of throat pain, RN reports patient with no noted difficulties swallowing pills or with breakfast meal this date. Recommend continuation of dysphagia 3 solids/thin liquids. meds crushed with puree. Intermittent supervision. ST to follow acutely.   HPI HPI: 79 yo female adm to [facility] with acute encephalopathy.  Pt with h/o menigioma s/p frontal craniotomy, HTN, chronic anemia, RA, DM, aortic stenosis and HLD.  Pt with CXR 03/09/2019 stable cardiomegaly with centeral pulmonary vascular congestion. 03/09/2019 chronic ischemia.  Pt is for a LP in IR today.      SLP Plan  Continue with current plan of care       Recommendations  Diet recommendations: Dysphagia 3 (mechanical soft);Thin liquid Medication Administration: Crushed with  puree Supervision: Patient able to self feed;Intermittent supervision to cue for compensatory strategies Compensations: Minimize environmental distractions;Slow rate;Small sips/bites Postural Changes and/or Swallow Maneuvers: Seated upright 90 degrees                Oral Care Recommendations: Oral care BID Follow up Recommendations: Skilled Nursing facility SLP Visit Diagnosis: Dysphagia, unspecified (R13.10) Plan: Continue with current plan of care       [place] 03/14/2019, 12:46 PM  Marina Goodell, M.Ed., [facility] Acute Rehabilitation [phone]: Acute Rehab office [phone] - pager

## 2019-03-14 NOTE — Progress Notes (Signed)
PT Cancellation Note  Patient Details Name: [name] MRN: [id number] DOB: [birth year]   Cancelled Treatment:    Reason Eval/Treat Not Completed: Other (comment);Fatigue/lethargy limiting ability to participate.  Sleepy both attempts, will retry at another time as pt is refusing for now.   Ramond Dial 03/14/2019, 3:48 PM   Mee Hives, PT MS Acute Rehab Dept. Number: [place] [facility] [place]

## 2019-03-14 NOTE — Consult Note (Signed)
[facility] Consult  Referring Provider: Thurnell Lose, MD Primary Care Physician:  Jonathon Jordan, MD Primary Gastroenterologist: Dr.Schooler  Reason for Consultation: Severe colitis noted on CAT scan  HPI: [name] is a 79 y.o. female with history of small bowel resection and partial colectomy/rectosigmoid colon for acute diverticulitis with perforation and diverticular related colitis in 03/2010 at [facility], last colonoscopy from 05/2011 by Dr. Michail Sermon for screening showed scattered diverticulosis, internal hemorrhoids and colocolonic anastomosis, was admitted on 03/09/2019 with altered mental status.  She has history of hypothyroidism, craniectomy for frontal temporal brain meningioma, chronic kidney disease stage IV, Metoyer arthritis, diabetes, chronic anemia, chronic pain, Covid infection, treated at [facility], aortic stenosis who presented to the ER with diarrhea, generalized weakness, multiple falls and increased confusion, possible seizure, started on Keppra.  Patient complains of generalized but more lower abdominal discomfort, sometimes up to 9 out of 10 in intensity. This is currently not associated with diarrhea, last bowel movement was yesterday, and was not loose or watery, with no evidence of blood. She has been able to tolerate dysphagia level 3 diet without associated nausea or vomiting. She has been started on IV ceftriaxone 2 g along with IV Flagyl 500 mg every 8 hours since 03/12/2019 due to CAT scan findings of extensive mural thickening throughout the colon, most severe from transverse colon to proximal sigmoid with surrounding inflammatory changes compatible with colitis. No pneumoperitoneum, no ascites.   Past Medical History:  Diagnosis Date  . Allergic rhinitis   . Anemia   . Aortic stenosis 12/22/2012   Mild 3/14-2.35 m/s peak velocity  . Cataract 2018  . CHF (congestive heart failure) ([place])   . Chronic pain   . CKD (chronic kidney disease), stage  III   . Diabetes mellitus   . Diverticulosis   . DVT (deep venous thrombosis) ([place])   . Glaucoma   . Hemorrhoids    internal  . HTN (hypertension)   . Hypercholesteremia   . Hyperlipidemia 12/22/2012  . Hypothyroidism   . Meningioma ([place]) 2008  . Osteoarthritis   . Peripheral neuropathy   . PPD positive    6 months ago  . PVC (premature ventricular contraction) 12/22/2012  . Rheumatoid arthritis(714.0)   . Sickle cell trait ([place])   . 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 ABDOMINAL HYSTERECTOMY  1978   TAH,& BSO 1  . CATARACT EXTRACTION Bilateral   . EYE SURGERY    . fistula repair  03/2010   partial colectomy  . intracranial surgery  11/2007   removal of meningioma  . JOINT REPLACEMENT  2001   left TKR  . LEFT HEART CATHETERIZATION WITH CORONARY ANGIOGRAM N/A 08/08/2013   Procedure: LEFT HEART CATHETERIZATION WITH CORONARY ANGIOGRAM;  Surgeon: Peter M Martinique, MD;  Location[facility] C[facility] LAB;  Service: Cardiovascular;  Laterality: N/A;  . ROTATOR CUFF REPAIR  1999   BIlateral  . SIGMOIDECTOMY  2012  . TOE SURGERY Left 2018  . TONSILLECTOMY AND ADENOIDECTOMY    . TOTAL THYROIDECTOMY  1992  . wrist sx     right     Prior to Admission medications   Medication Sig Start Date End Date Taking? Authorizing Provider  abatacept 750 mg in sodium chloride 0.9 % 70 mL Inject 750 mg into the vein every 28 (twenty-eight) days. 09/21/18  Yes Bo Merino, MD  acetaminophen (TYLENOL) 500 MG tablet Take 1,000 mg by mouth every 6 (six) hours as needed for mild pain.   Yes [provider]  allopurinol (ZYLOPRIM) 300 MG tablet Take 1 tablet (300 mg total) by mouth daily. 03/31/17 05/06/19 Yes Deveshwar, Abel Presto, MD  Ascorbic Acid (VITA-C PO) Take 1 capsule by mouth daily.   Yes [provider]  aspirin EC 81 MG EC tablet Take 1 tablet (81 mg total) by mouth daily.  10/22/15  Yes Dessa Phi, DO  Aspirin-Caffeine (BACK & BODY EXTRA STRENGTH) 500-32.5 MG TABS Take 1 tablet by mouth daily as needed (pain).   Yes [provider]  atorvastatin (LIPITOR) 80 MG tablet Take 80 mg by mouth daily.  06/22/16  Yes [provider]  Calcium Carb-Cholecalciferol (CALCIUM 600-D PO) Take 1 tablet by mouth daily.   Yes [provider]  Cholecalciferol (VITAMIN D3) 10 MCG (400 UNIT) tablet Take 400 Units by mouth daily.   Yes [provider]  clotrimazole-betamethasone (LOTRISONE) cream Apply 1 application topically daily as needed (rash/irritation).  05/29/16  Yes [provider]  cyclobenzaprine (FLEXERIL) 10 MG tablet Take 10 mg by mouth 3 (three) times daily as needed for muscle spasms.    Yes [provider]  ferrous sulfate (FER-IN-SOL) 75 (15 Fe) MG/ML SOLN Take 45 mg of iron by mouth daily.   Yes [provider]  HYDROcodone-acetaminophen (NORCO) 5-325 MG tablet Take 1 tablet by mouth every 6 (six) hours as needed for moderate pain. Patient taking differently: Take 1-2 tablets by mouth every 6 (six) hours as needed for moderate pain.  07/22/16  Yes Stover, Titorya, DPM  Insulin Isophane & Regular Human (NOVOLIN 70/30 FLEXPEN RELION) (70-30) 100 UNIT/ML PEN Inject 6-8 Units into the skin 3 (three) times daily as needed (CBG >140).   Yes [provider]  leflunomide (ARAVA) 10 MG tablet Take 1 tablet (10 mg total) by mouth daily. 12/16/18  Yes Deveshwar, Abel Presto, MD  levothyroxine (SYNTHROID) 150 MCG tablet Take 1 tablet (150 mcg total) by mouth daily before breakfast. 02/28/19  Yes Gherghe, Vella Redhead, MD  loperamide (IMODIUM) 2 MG capsule Take 2 mg by mouth as needed for diarrhea or loose stools.   Yes [provider]  Omega-3 Fatty Acids (FISH OIL PO) Take 1 capsule by mouth daily.   Yes [provider]  pregabalin (LYRICA) 75 MG capsule 1 bid Patient taking differently: Take 75 mg by  mouth 2 (two) times daily.  09/30/16  Yes Deveshwar, Abel Presto, MD  vitamin B-12 (CYANOCOBALAMIN) 50 MCG tablet Take 50 mcg by mouth daily.   Yes [provider]  BD INSULIN SYRINGE ULTRAFINE 31G X 15/64" 0.3 ML MISC  11/02/15   [provider]    Current Facility-Administered Medications  Medication Dose Route Frequency Provider Last Rate Last Admin  . acetaminophen (TYLENOL) suppository 487.5 mg  487.5 mg Rectal Q6H PRN Thurnell Lose, MD   487.5 mg at 03/10/19 1839  . albuterol (VENTOLIN HFA) 108 (90 Base) MCG/ACT inhaler 2 puff  2 puff Inhalation Q6H PRN Thurnell Lose, MD      . cefTRIAXone (ROCEPHIN) 2 g in sodium chloride 0.9 % 100 mL IVPB  2 g Intravenous Q24H Thurnell Lose, MD   Stopped at 03/13/19 1221  . famotidine (PEPCID) tablet 40 mg  40 mg Oral BID Thurnell Lose, MD   40 mg at 03/14/19 0820  . ferrous sulfate tablet 325 mg  325 mg Oral TID WC  Thurnell Lose, MD   325 mg at 03/14/19 [phone]  . insulin aspart (novoLOG) injection 0-9 Units  0-9 Units Subcutaneous TID WC Chauncey Mann, MD   1 Units at 03/13/19 1858  . levETIRAcetam (KEPPRA) 100 MG/ML solution 500 mg  500 mg Oral BID Thurnell Lose, MD   500 mg at 03/14/19 0177  . levothyroxine (SYNTHROID) tablet 150 mcg  150 mcg Oral Q0600 Thurnell Lose, MD   150 mcg at 03/14/19 [phone]  . metroNIDAZOLE (FLAGYL) IVPB 500 mg  500 mg Intravenous Q8H Thurnell Lose, MD 100 mL/hr at 03/14/19 0452 500 mg at 03/14/19 0452  . tamsulosin (FLOMAX) capsule 0.4 mg  0.4 mg Oral Daily Thurnell Lose, MD   0.4 mg at 03/14/19 3009    Allergies as of 03/09/2019 - Review Complete 02/14/2019  Allergen Reaction Noted  . Methotrexate derivatives Other (See Comments) 09/20/2014  . Actemra [tocilizumab] Rash 12/28/2018  . Other Other (See Comments) 08/23/2012  . Penicillins Rash 11/13/2010  . Chlorhexidine  02/18/2019  . Remicade [infliximab] Other (See Comments) 08/25/2015  . Zocor [simvastatin] Other (See  Comments) 11/13/2010    Family History  Problem Relation Age of Onset  . Hypotension Mother   . CVA Mother   . Diabetes Mother   . Hypertension Father   . Kidney failure Father     Social History   Socioeconomic History  . Marital status: Married    Spouse name: Not on file  . Number of children: Not on file  . Years of education: Not on file  . Highest education level: Not on file  Occupational History  . Not on file  Tobacco Use  . Smoking status: Former Smoker    Packs/day: 0.10    Years: 2.00    Pack years: 0.20    Types: Cigarettes    Quit date: 01/06/1962    Years since quitting: 57.2  . Smokeless tobacco: Never Used  . Tobacco comment: Socially   Substance and Sexual Activity  . Alcohol use: Not Currently    Comment: occasionally  . Drug use: No  . Sexual activity: Not on file  Other Topics Concern  . Not on file  Social History Narrative   Daycare Provider   Drinks about 1 cup of coffee a day    Social Determinants of Health   Financial Resource Strain:   . Difficulty of Paying Living Expenses: Not on file  Food Insecurity:   . Worried About Charity fundraiser in the Last Year: Not on file  . Ran Out of Food in the Last Year: Not on file  Transportation Needs:   . Lack of Transportation (Medical): Not on file  . Lack of Transportation (Non-Medical): Not on file  Physical Activity:   . Days of Exercise per Week: Not on file  . Minutes of Exercise per Session: Not on file  Stress:   . Feeling of Stress : Not on file  Social Connections:   . Frequency of Communication with Friends and Family: Not on file  . Frequency of Social Gatherings with Friends and Family: Not on file  . Attends Religious Services: Not on file  . Active Member of Clubs or Organizations: Not on file  . Attends Archivist Meetings: Not on file  . Marital Status: Not on file  Intimate Partner Violence:   . Fear of Current or Ex-Partner: Not on file  . Emotionally  Abused: Not on file  .  Physically Abused: Not on file  . Sexually Abused: Not on file    Review of Systems: Positive for: GI: Described in detail in HPI.    Gen: anorexia, fatigue, weakness, malaise,Denies any fever, chills, rigors, night sweats,  involuntary weight loss, and sleep disorder CV: Denies chest pain, angina, palpitations, syncope, orthopnea, PND, peripheral edema, and claudication. Resp: Denies dyspnea, cough, sputum, wheezing, coughing up blood. GU : Denies urinary burning, blood in urine, urinary frequency, urinary hesitancy, nocturnal urination, and urinary incontinence. MS: muscle weakness,Denies joint pain or swelling.  Denies  cramps, atrophy.  Derm: Denies rash, itching, oral ulcerations, hives, unhealing ulcers.  Psych: Denies depression, anxiety, memory loss, suicidal ideation, hallucinations,  and confusion. Heme: Denies bruising, bleeding, and enlarged lymph nodes. Neuro:  Denies any headaches, dizziness, paresthesias. Endo:  DM, hypothyroid, Denies any problems with adrenal function.  Physical Exam: Vital signs in last 24 hours: Temp:  [97.8 F (36.6 C)-98.3 F (36.8 C)] 98.2 F (36.8 C) (03/08 0917) Pulse Rate:  [70-78] 72 (03/08 0917) Resp:  [15-19] 18 (03/08 0917) BP: (136-141)/(48-66) 136/48 (03/08 0917) SpO2:  [97 %-100 %] 100 % (03/08 0917) Last BM Date: 03/13/19  General:   Alert,  Well-developed, overweight, pleasant and cooperative in NAD Head:  Normocephalic and atraumatic. Eyes:  Sclera clear, no icterus.   Prominent pallor Ears:  Normal auditory acuity. Nose:  No deformity, discharge,  or lesions. Mouth:  No deformity or lesions.  Oropharynx pink & moist. Neck:  Supple; no masses or thyromegaly. Lungs:  Clear throughout to auscultation.   No wheezes, crackles, or rhonchi. No acute distress. Heart:  Regular rate and rhythm; no murmurs, clicks, rubs,  or gallops. Extremities:  Without clubbing or edema. Neurologic:  Alert and  oriented x4;   grossly normal neurologically. Skin:  Intact without significant lesions or rashes. Psych:  Alert and cooperative. Normal mood and affect. Abdomen:  Large bruises and likely abdominal wall hematomas(tender to touch), small well healed abdominal scar, no rebound tenderness/rigidity or guarding, normoactive bowel sounds     Lab Results: Recent Labs    03/12/19 0245 03/13/19 0221 03/14/19 0456  WBC 11.8* 12.5* 8.4  HGB 7.5* 7.1* 6.7*  HCT 23.2* 21.5* 20.7*  PLT 298 295 303   BMET Recent Labs    03/12/19 0245 03/13/19 0221 03/14/19 0456  NA 143 144 144  K 3.7 4.1 3.7  CL 112* 114* 114*  CO2 21* 21* 21*  GLUCOSE 127* 123* 106*  BUN 27* 27* 26*  CREATININE 2.94* 2.55* 2.32*  CALCIUM 8.2* 8.1* 8.0*   LFT Recent Labs    03/14/19 0456  PROT 4.6*  ALBUMIN 1.4*  AST 13*  ALT 9  ALKPHOS 94  BILITOT 0.6   PT/INR No results for input(s): LABPROT, INR in the last 72 hours.  Studies/Results: No results found.  Impression: Extensive transmural thickening of majority of the colon, most prominent from transverse to sigmoid compatible with severe colitis Possibly infectious in origin, patient had diarrhea on presentation which is now resolved.  Leukocytosis trending down, is normal today at 8.4 Chronic anemia with a hemoglobin of 6.7 today, without obvious melena or hematochezia, but could be related to abdominal wall hematomas and large lower abdominal wall bruising.  Improving acidosis, bicarb 21 Chronic kidney disease, BUN 26/creatinine 2.32/GFR 22 Improving CRP, 7.2 today   Covid positivity on 02/14/2019  [employer]  Plan: I had a detailed discussion with the patient at bedside and her daughter, Chrystie Nose at [phone].  I discussed about the option of managing patient conservatively with IV antibiotics, monitoring clinical parameters(WBC count, temperature, heart rate, blood pressure, abdominal pain, bowel movements) versus diagnostic colonoscopy  (which would require for patient to drink a gallon of colonic prep, undergo anesthesia sedation, with possible increased risk of bleeding and perforation--due to severe colitis), which may be complicated by the fact that she refuses blood transfusion even if it is life-threatening.  At this point, patient has no evidence of diarrhea/obvious melena/hematochezia. The patient and her daughter wanted to manage her conservatively. Recommend continuing IV antibiotics for a total of 7 to 10 days(can be switched to oral when patient is ready for discharge). She will likely need repeat imaging study in 1 week(inpatient versus outpatient depending upon her clinical course). Recommend IV iron infusions. I will follow the patient from a distance, with plans for intervention if needed.   LOS: 4 days   Ronnette Juniper, MD  03/14/2019, 10:09 AM

## 2019-03-14 NOTE — Plan of Care (Signed)
  Problem: Education: Goal: Knowledge of General Education information will improve Description Including pain rating scale, medication(s)/side effects and non-pharmacologic comfort measures Outcome: Progressing   

## 2019-03-15 LAB — COMPREHENSIVE METABOLIC PANEL
ALT: 9 U/L (ref 0–44)
AST: 18 U/L (ref 15–41)
Albumin: 1.5 g/dL — ABNORMAL LOW (ref 3.5–5.0)
Alkaline Phosphatase: 110 U/L (ref 38–126)
Anion gap: 12 (ref 5–15)
BUN: 24 mg/dL — ABNORMAL HIGH (ref 8–23)
CO2: 19 mmol/L — ABNORMAL LOW (ref 22–32)
Calcium: 8 mg/dL — ABNORMAL LOW (ref 8.9–10.3)
Chloride: 111 mmol/L (ref 98–111)
Creatinine, Ser: 2.23 mg/dL — ABNORMAL HIGH (ref 0.44–1.00)
GFR calc Af Amer: 23 mL/min — ABNORMAL LOW (ref >60)
GFR calc non Af Amer: 20 mL/min — ABNORMAL LOW (ref >60)
Glucose, Bld: 107 mg/dL — ABNORMAL HIGH (ref 70–99)
Potassium: 3.9 mmol/L (ref 3.5–5.1)
Sodium: 142 mmol/L (ref 135–145)
Total Bilirubin: 0.3 mg/dL (ref 0.3–1.2)
Total Protein: 4.6 g/dL — ABNORMAL LOW (ref 6.5–8.1)

## 2019-03-15 LAB — CYTOLOGY - NON PAP

## 2019-03-15 LAB — CBC WITH DIFFERENTIAL/PLATELET
Abs Immature Granulocytes: 0.56 10*3/uL — ABNORMAL HIGH (ref 0.00–0.07)
Basophils Absolute: 0 10*3/uL (ref 0.0–0.1)
Basophils Relative: 1 %
Eosinophils Absolute: 0.4 10*3/uL (ref 0.0–0.5)
Eosinophils Relative: 5 %
HCT: 22.1 % — ABNORMAL LOW (ref 36.0–46.0)
Hemoglobin: 7.1 g/dL — ABNORMAL LOW (ref 12.0–15.0)
Immature Granulocytes: 7 %
Lymphocytes Relative: 26 %
Lymphs Abs: 2.1 10*3/uL (ref 0.7–4.0)
MCH: 29.3 pg (ref 26.0–34.0)
MCHC: 32.1 g/dL (ref 30.0–36.0)
MCV: 91.3 fL (ref 80.0–100.0)
Monocytes Absolute: 0.4 10*3/uL (ref 0.1–1.0)
Monocytes Relative: 5 %
Neutro Abs: 4.6 10*3/uL (ref 1.7–7.7)
Neutrophils Relative %: 56 %
Platelets: 346 10*3/uL (ref 150–400)
RBC: 2.42 MIL/uL — ABNORMAL LOW (ref 3.87–5.11)
RDW: 17.9 % — ABNORMAL HIGH (ref 11.5–15.5)
WBC: 7.8 10*3/uL (ref 4.0–10.5)
nRBC: 0.6 % — ABNORMAL HIGH (ref 0.0–0.2)

## 2019-03-15 LAB — MAGNESIUM: Magnesium: 1.6 mg/dL — ABNORMAL LOW (ref 1.7–2.4)

## 2019-03-15 LAB — APTT: aPTT: 27 seconds (ref 24–36)

## 2019-03-15 LAB — C-REACTIVE PROTEIN: CRP: 4.2 mg/dL — ABNORMAL HIGH (ref <1.0)

## 2019-03-15 LAB — GLUCOSE, CAPILLARY
Glucose-Capillary: 117 mg/dL — ABNORMAL HIGH (ref 70–99)
Glucose-Capillary: 160 mg/dL — ABNORMAL HIGH (ref 70–99)
Glucose-Capillary: 103 mg/dL — ABNORMAL HIGH (ref 70–99)
Glucose-Capillary: 179 mg/dL — ABNORMAL HIGH (ref 70–99)

## 2019-03-15 LAB — BRAIN NATRIURETIC PEPTIDE: B Natriuretic Peptide: 338.2 pg/mL — ABNORMAL HIGH (ref 0.0–100.0)

## 2019-03-15 LAB — D-DIMER, QUANTITATIVE: D-Dimer, Quant: 9.19 ug/mL-FEU — ABNORMAL HIGH (ref 0.00–0.50)

## 2019-03-15 MED ORDER — BOOST / RESOURCE BREEZE PO LIQD CUSTOM
1.0000 | Freq: Three times a day (TID) | ORAL | Status: DC
  Administered 2019-03-15 – 2019-03-22 (×18): 1 via ORAL

## 2019-03-15 MED ORDER — MAGNESIUM SULFATE 2 GM/50ML IV SOLN
2.0000 g | Freq: Once | INTRAVENOUS | Status: AC
  Administered 2019-03-15: 2 g via INTRAVENOUS
  Filled 2019-03-15: qty 50

## 2019-03-15 MED ORDER — PRO-STAT SUGAR FREE PO LIQD
30.0000 mL | Freq: Three times a day (TID) | ORAL | Status: DC
  Administered 2019-03-15 – 2019-03-22 (×16): 30 mL via ORAL
  Filled 2019-03-15 (×18): qty 30

## 2019-03-15 NOTE — Progress Notes (Signed)
Subjective: Patient was sound asleep as I entered the room. She states she continues to have lower abdominal pain. As per her nurse she had 1 bowel movement yesterday which was" mushy" and green in color. She is taking small bites of food, not finishing her meals, but has been drinking Ensure consistently.  Objective: Vital signs in last 24 hours: Temp:  [98.2 F (36.8 C)-98.9 F (37.2 C)] 98.7 F (37.1 C) (03/09 0748) Pulse Rate:  [64-77] 67 (03/09 0748) Resp:  [12-18] 15 (03/09 0748) BP: (128-143)/(45-68) 128/68 (03/09 0748) SpO2:  [97 %-100 %] 100 % (03/09 0748) Weight:  [85 kg] 85 kg (03/08 2126) Weight change:  Last BM Date: 03/14/19  PE: Elderly, frail, overweight, prominent pallor GENERAL: Not in distress ABDOMEN: Large lower abdominal wall bruise with nodules felt on abdominal wall, likely underlying hematomas, otherwise abdomen is soft, with mild diffuse generalized tenderness, normal active bowel sounds, no rigidity or guarding EXTREMITIES: No deformity  Lab Results: Results for orders placed or performed during the hospital encounter of 03/09/19 (from the past 48 hour(s))  Glucose, capillary     Status: Abnormal   Collection Time: 03/13/19 11:34 AM  Result Value Ref Range   Glucose-Capillary 129 (H) 70 - 99 mg/dL    Comment: Glucose reference range applies only to samples taken after fasting for at least 8 hours.  Glucose, capillary     Status: Abnormal   Collection Time: 03/13/19  8:38 PM  Result Value Ref Range   Glucose-Capillary 109 (H) 70 - 99 mg/dL    Comment: Glucose reference range applies only to samples taken after fasting for at least 8 hours.  APTT     Status: Abnormal   Collection Time: 03/14/19  4:56 AM  Result Value Ref Range   aPTT 37 (H) 24 - 36 seconds    Comment:        IF BASELINE aPTT IS ELEVATED, SUGGEST PATIENT RISK ASSESSMENT BE USED TO DETERMINE APPROPRIATE ANTICOAGULANT THERAPY. Performed at [facility], [place] [address]  [place], [place] [address]   Brain natriuretic peptide     Status: Abnormal   Collection Time: 03/14/19  4:56 AM  Result Value Ref Range   B Natriuretic Peptide 572.1 (H) 0.0 - 100.0 pg/mL    Comment: Performed at [facility] [address], [place], [place] [address]  C-reactive protein     Status: Abnormal   Collection Time: 03/14/19  4:56 AM  Result Value Ref Range   CRP 7.2 (H) <1.0 mg/dL    Comment: Performed at [facility] [address] [place], [place] [address]  CBC with Differential/Platelet     Status: Abnormal   Collection Time: 03/14/19  4:56 AM  Result Value Ref Range   WBC 8.4 4.0 - 10.5 K/uL   RBC 2.33 (L) 3.87 - 5.11 MIL/uL   Hemoglobin 6.7 (LL) 12.0 - 15.0 g/dL    Comment: REPEATED TO VERIFY THIS CRITICAL RESULT HAS VERIFIED AND BEEN CALLED TO RN TINA ISAACK BY MESSAN HOUEGNIFIO ON 03 08 2021 AT 0540, AND HAS BEEN READ BACK.     HCT 20.7 (L) 36.0 - 46.0 %   MCV 88.8 80.0 - 100.0 fL   MCH 28.8 26.0 - 34.0 pg   MCHC 32.4 30.0 - 36.0 g/dL   RDW 17.9 (H) 11.5 - 15.5 %   Platelets 303 150 - 400 K/uL   nRBC 0.0 0.0 - 0.2 %   Neutrophils Relative % 64 %  Neutro Abs 5.4 1.7 - 7.7 K/uL   Lymphocytes Relative 21 %   Lymphs Abs 1.8 0.7 - 4.0 K/uL   Monocytes Relative 4 %   Monocytes Absolute 0.3 0.1 - 1.0 K/uL   Eosinophils Relative 6 %   Eosinophils Absolute 0.5 0.0 - 0.5 K/uL   Basophils Relative 0 %   Basophils Absolute 0.0 0.0 - 0.1 K/uL   Immature Granulocytes 5 %   Abs Immature Granulocytes 0.43 (H) 0.00 - 0.07 K/uL    Comment: Performed at [facility] [address] [place], [place] [address]  Comprehensive metabolic panel     Status: Abnormal   Collection Time: 03/14/19  4:56 AM  Result Value Ref Range   Sodium 144 135 - 145 mmol/L   Potassium 3.7 3.5 - 5.1 mmol/L   Chloride 114 (H) 98 - 111 mmol/L   CO2 21 (L) 22 - 32 mmol/L   Glucose, Bld 106 (H) 70 - 99 mg/dL    Comment: Glucose reference range applies only to samples taken after  fasting for at least 8 hours.   BUN 26 (H) 8 - 23 mg/dL   Creatinine, Ser 2.32 (H) 0.44 - 1.00 mg/dL   Calcium 8.0 (L) 8.9 - 10.3 mg/dL   Total Protein 4.6 (L) 6.5 - 8.1 g/dL   Albumin 1.4 (L) 3.5 - 5.0 g/dL   AST 13 (L) 15 - 41 U/L   ALT 9 0 - 44 U/L   Alkaline Phosphatase 94 38 - 126 U/L   Total Bilirubin 0.6 0.3 - 1.2 mg/dL   GFR calc non Af Amer 19 (L) >60 mL/min   GFR calc Af Amer 22 (L) >60 mL/min   Anion gap 9 5 - 15    Comment: Performed at [facility] [address], [place], [place] [address]  D-dimer, quantitative (not at [facility])     Status: Abnormal   Collection Time: 03/14/19  4:56 AM  Result Value Ref Range   D-Dimer, Quant 10.76 (H) 0.00 - 0.50 ug/mL-FEU    Comment: (NOTE) At the manufacturer cut-off of 0.50 ug/mL FEU, this assay has been documented to exclude PE with a sensitivity and negative predictive value of 97 to 99%.  At this time, this assay has not been approved by the FDA to exclude DVT/VTE. Results should be correlated with clinical presentation. Performed at [facility], [place] [address] [place], [place] [address]   Magnesium     Status: None   Collection Time: 03/14/19  4:56 AM  Result Value Ref Range   Magnesium 1.7 1.7 - 2.4 mg/dL    Comment: Performed at [facility] [address], [place], [place] [address]  Glucose, capillary     Status: None   Collection Time: 03/14/19  6:55 AM  Result Value Ref Range   Glucose-Capillary 95 70 - 99 mg/dL    Comment: Glucose reference range applies only to samples taken after fasting for at least 8 hours.  Vitamin B12     Status: None   Collection Time: 03/14/19 10:09 AM  Result Value Ref Range   Vitamin B-12 737 180 - 914 pg/mL    Comment: (NOTE) This assay is not validated for testing neonatal or myeloproliferative syndrome specimens for Vitamin B12 levels. Performed at [facility], [place] [address], [place], [place] [address]   Iron and TIBC     Status: Abnormal   Collection  Time: 03/14/19 10:09 AM  Result  Value Ref Range   Iron 48 28 - 170 ug/dL   TIBC 102 (L) 250 - 450 ug/dL   Saturation Ratios 47 (H) 10.4 - 31.8 %   UIBC 54 ug/dL    Comment: Performed at [facility] [address] [place], [place] [address]  Ferritin     Status: Abnormal   Collection Time: 03/14/19 10:09 AM  Result Value Ref Range   Ferritin 2,380 (H) 11 - 307 ng/mL    Comment: Performed at [facility], [place] [address], [place], [place] [address]  Reticulocytes     Status: Abnormal   Collection Time: 03/14/19 10:09 AM  Result Value Ref Range   Retic Ct Pct 0.7 0.4 - 3.1 %   RBC. 2.45 (L) 3.87 - 5.11 MIL/uL   Retic Count, Absolute 16.4 (L) 19.0 - 186.0 K/uL   Immature Retic Fract 14.4 2.3 - 15.9 %    Comment: Performed at [facility] [address], [place], [place] [address]  Folate     Status: None   Collection Time: 03/14/19 10:09 AM  Result Value Ref Range   Folate 9.7 >5.9 ng/mL    Comment: Performed at [facility] [address], [place], [place] [address]  Glucose, capillary     Status: Abnormal   Collection Time: 03/14/19 11:27 AM  Result Value Ref Range   Glucose-Capillary 146 (H) 70 - 99 mg/dL    Comment: Glucose reference range applies only to samples taken after fasting for at least 8 hours.  Glucose, capillary     Status: Abnormal   Collection Time: 03/14/19  4:28 PM  Result Value Ref Range   Glucose-Capillary 115 (H) 70 - 99 mg/dL    Comment: Glucose reference range applies only to samples taken after fasting for at least 8 hours.  Glucose, capillary     Status: Abnormal   Collection Time: 03/14/19  9:23 PM  Result Value Ref Range   Glucose-Capillary 154 (H) 70 - 99 mg/dL    Comment: Glucose reference range applies only to samples taken after fasting for at least 8 hours.  APTT     Status: None   Collection Time: 03/15/19  4:48 AM  Result Value Ref Range   aPTT 27 24 - 36 seconds    Comment: Performed at [facility] [address], [place], [place] [address]  C-reactive protein     Status: Abnormal   Collection Time: 03/15/19  4:48 AM  Result Value Ref Range   CRP 4.2 (H) <1.0 mg/dL    Comment: Performed at [facility] [address] [place], [place] [address]  CBC with Differential/Platelet     Status: Abnormal   Collection Time: 03/15/19  4:48 AM  Result Value Ref Range   WBC 7.8 4.0 - 10.5 K/uL   RBC 2.42 (L) 3.87 - 5.11 MIL/uL   Hemoglobin 7.1 (L) 12.0 - 15.0 g/dL   HCT 22.1 (L) 36.0 - 46.0 %   MCV 91.3 80.0 - 100.0 fL   MCH 29.3 26.0 - 34.0 pg   MCHC 32.1 30.0 - 36.0 g/dL   RDW 17.9 (H) 11.5 - 15.5 %   Platelets 346 150 - 400 K/uL   nRBC 0.6 (H) 0.0 - 0.2 %   Neutrophils Relative % 56 %   Neutro Abs 4.6 1.7 - 7.7 K/uL   Lymphocytes Relative 26 %   Lymphs Abs 2.1 0.7 - 4.0 K/uL  Monocytes Relative 5 %   Monocytes Absolute 0.4 0.1 - 1.0 K/uL   Eosinophils Relative 5 %   Eosinophils Absolute 0.4 0.0 - 0.5 K/uL   Basophils Relative 1 %   Basophils Absolute 0.0 0.0 - 0.1 K/uL   WBC Morphology MILD LEFT SHIFT (1-5% METAS, OCC MYELO, OCC BANDS)    Immature Granulocytes 7 %   Abs Immature Granulocytes 0.56 (H) 0.00 - 0.07 K/uL    Comment: Performed at [facility] [address] [place], [place] [address]  Comprehensive metabolic panel     Status: Abnormal   Collection Time: 03/15/19  4:48 AM  Result Value Ref Range   Sodium 142 135 - 145 mmol/L   Potassium 3.9 3.5 - 5.1 mmol/L   Chloride 111 98 - 111 mmol/L   CO2 19 (L) 22 - 32 mmol/L   Glucose, Bld 107 (H) 70 - 99 mg/dL    Comment: Glucose reference range applies only to samples taken after fasting for at least 8 hours.   BUN 24 (H) 8 - 23 mg/dL   Creatinine, Ser 2.23 (H) 0.44 - 1.00 mg/dL   Calcium 8.0 (L) 8.9 - 10.3 mg/dL   Total Protein 4.6 (L) 6.5 - 8.1 g/dL   Albumin 1.5 (L) 3.5 - 5.0 g/dL   AST 18 15 - 41 U/L   ALT 9 0 - 44 U/L   Alkaline Phosphatase 110 38 - 126 U/L   Total Bilirubin 0.3 0.3 - 1.2 mg/dL   GFR  calc non Af Amer 20 (L) >60 mL/min   GFR calc Af Amer 23 (L) >60 mL/min   Anion gap 12 5 - 15    Comment: Performed at [facility] [address] [place], [place] [address]  D-dimer, quantitative (not at [facility])     Status: Abnormal   Collection Time: 03/15/19  4:48 AM  Result Value Ref Range   D-Dimer, Quant 9.19 (H) 0.00 - 0.50 ug/mL-FEU    Comment: (NOTE) At the manufacturer cut-off of 0.50 ug/mL FEU, this assay has been documented to exclude PE with a sensitivity and negative predictive value of 97 to 99%.  At this time, this assay has not been approved by the FDA to exclude DVT/VTE. Results should be correlated with clinical presentation. Performed at [facility], [place] [address], [place],  [address]   Magnesium     Status: Abnormal   Collection Time: 03/15/19  4:48 AM  Result Value Ref Range   Magnesium 1.6 (L) 1.7 - 2.4 mg/dL    Comment: Performed at [facility] [address] [place], [place] [address]  Glucose, capillary     Status: Abnormal   Collection Time: 03/15/19  7:22 AM  Result Value Ref Range   Glucose-Capillary 117 (H) 70 - 99 mg/dL    Comment: Glucose reference range applies only to samples taken after fasting for at least 8 hours.    Studies/Results: No results found.  Medications: I have reviewed the patient's current medications.  Assessment: Severe colitis noted on CAT scan, with ongoing anemia, without obvious melena or hematochezia, but with history of diarrhea, being treated with IV Rocephin and IV Flagyl empirically.  History of diverticulosis, diverticulitis with perforation in 2012 requiring colonic resection, last colonoscopy from 2013  Renal impairment, BUN 24, creatinine 2.23, GFR 23 Improving CRP, 4.2 today Malnutrition, albumin 1.5, total protein 4.6  Recovering from toxic and metabolic encephalopathy Recent COVID-19 pneumonia, fully treated, out of  current time. Kidney disease stage IV History of  hypothyroidism, meningioma with craniotomy, dyslipidemia, moderate to severe aortic stenosis  Plan: Colitis noted on CAT scan could be infectious(history of diarrhea and leukocytosis, both of which have resolved while being on IV antibiotics) or ischemic After discussion with patient and family members, we have decided to take the conservative route with supportive management. Patient is a Sales promotion account executive [employer] and refuses blood transfusion even if it is a life-threatening situation. Continue supportive care, plan repeat CAT scan in a week to re-evaluate colitis noted on CAT scan  Ronnette Juniper, MD 03/15/2019, 8:57 AM

## 2019-03-15 NOTE — Progress Notes (Signed)
PT Cancellation Note  Patient Details Name: [name] MRN: [id number] DOB: 12-13-1940   Cancelled Treatment:    Reason Eval/Treat Not Completed: Patient's level of consciousness.   Will re-attempt at another time.   Ramond Dial 03/15/2019, 2:11 PM   Mee Hives, PT MS Acute Rehab Dept. Number: [place]

## 2019-03-15 NOTE — Plan of Care (Signed)
  Problem: Clinical Measurements: Goal: Diagnostic test results will improve Outcome: Progressing   

## 2019-03-15 NOTE — Progress Notes (Signed)
Occupational Therapy Treatment Patient Details Name: [name] MRN: [id number] DOB: [birth year] Today's Date: 03/15/2019    History of present illness Pt is is an 79 y.o. 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 Daughter reports jerking movements of pt's legs and odd chewing motions prior to admit. No szs found on EEG. Pt had multiple falls prior to admit and found to have toxic & metabolic encephalopathy.   OT comments  Pt in bed and alert upon arrival. Session focused on bed mobility to sit EOB to participate in simple grooming, sitting balance/tolerance and UB selfcare. P talkative and attentive. OT will continue to follow acutely  Follow Up Recommendations  SNF    Equipment Recommendations  Other (comment)(TBD at next venue of care)    Recommendations for Other Services      Precautions / Restrictions Precautions Precautions: Fall Restrictions Weight Bearing Restrictions: No       Mobility Bed Mobility Overal bed mobility: Needs Assistance Bed Mobility: Supine to Sit     Supine to sit: Max assist Sit to supine: Mod assist   General bed mobility comments: max A to bring LEs to EOB and elevatetrunk, mod A with LEs back onto bed  Transfers                 General transfer comment: unable    Balance Overall balance assessment: Needs assistance Sitting-balance support: Feet supported;Bilateral upper extremity supported Sitting balance-Leahy Scale: Poor Sitting balance - Comments: mod A initially for balance/support, then progressed to min guard A                                   ADL either performed or assessed with clinical judgement   ADL Overall ADL's : Needs assistance/impaired     Grooming: Wash/dry  hands;Wash/dry face;Sitting;Minimal assistance   Upper Body Bathing: Minimal assistance;Sitting Upper Body Bathing Details (indicate cue type and reason): simulated Lower Body Bathing: Maximal assistance;Sitting/lateral leans   Upper Body Dressing : Minimal assistance Upper Body Dressing Details (indicate cue type and reason): donned clean gown                   General ADL Comments: will need +2 assist for OOB     Vision Baseline Vision/History: Wears glasses Wears Glasses: Reading only     Perception     Praxis      Cognition Arousal/Alertness: Lethargic Behavior During Therapy: WFL for tasks assessed/performed Overall Cognitive Status: Impaired/Different from baseline Area of Impairment: Orientation;Attention;Memory;Following commands;Safety/judgement;Awareness;Problem solving                     Memory: Decreased recall of precautions;Decreased short-term memory Following Commands: Follows one step commands inconsistently Safety/Judgement: Decreased awareness of safety;Decreased awareness of deficits   Problem Solving: Slow processing;Decreased initiation;Difficulty sequencing;Requires verbal cues;Requires tactile cues General Comments: pt alert but with confusion        Exercises     Shoulder Instructions       General Comments      Pertinent Vitals/ Pain       Pain Assessment: Faces Pain Location: R shoulder with reaching across midline Pain Descriptors / Indicators: Aching;Grimacing Pain Intervention(s): Limited activity within patient's tolerance;Monitored during  session  Home Living                                          Prior Functioning/Environment              Frequency  Min 2X/week        Progress Toward Goals  OT Goals(current goals can now be found in the care plan section)  Progress towards OT goals: Progressing toward goals     Plan Discharge plan remains appropriate    Co-evaluation                  AM-PAC OT "6 Clicks" Daily Activity     Outcome Measure   Help from another person eating meals?: A Little Help from another person taking care of personal grooming?: A Little Help from another person toileting, which includes using toliet, bedpan, or urinal?: Total Help from another person bathing (including washing, rinsing, drying)?: A Lot Help from another person to put on and taking off regular upper body clothing?: A Little Help from another person to put on and taking off regular lower body clothing?: A Lot 6 Click Score: 14    End of Session    OT Visit Diagnosis: Unsteadiness on feet (R26.81);Muscle weakness (generalized) (M62.81);Other symptoms and signs involving cognitive function   Activity Tolerance Patient limited by fatigue   Patient Left in bed;with call bell/phone within reach   Nurse Communication          Time: 6270-3500 OT Time Calculation (min): 25 min  Charges: OT General Charges $OT Visit: 1 Visit OT Treatments $Self Care/Home Management : 8-22 mins $Therapeutic Activity: 8-22 mins     Britt Bottom 03/15/2019, 1:58 PM

## 2019-03-15 NOTE — Progress Notes (Signed)
Foley removed by me Investment banker, corporate). Patient tolerated well.   Farley Ly, RN

## 2019-03-15 NOTE — Progress Notes (Signed)
PROGRESS NOTE                                                                                                                                                                                                             Patient Demographics:    [name], is a 79 y.o. female, DOB - 08-17-1940, ZOX:[id number]  Outpatient Primary MD for the patient is Jonathon Jordan, MD    LOS - 5  Admit date - 03/09/2019    Chief Complaint  Patient presents with  . Altered Mental Status       Brief Narrative   [name] is a 79 y.o. female with PMH hypothyroidism, hx of craniectomy(for frontal temporary brain meningioma resection), CKD 4, rheumatoid arthritis, DM chronic anemia, chronic pain, recent COVID infection for which she was treated to [facility] and discharged few weeks ago, aortic stenosis.  Who lives at home and lately was having some diarrhea and generalized weakness, had multiple falls at home around 3-4 and thereafter became increasingly confused.  Was brought to the ER where she was found to have low-grade fever, had toxic and metabolic encephalopathy, dehydration, neurology was consulted.  She was started on empiric treatment for meningitis and admitted to the hospital.  Further work-up revealed that she simply was having seizures in response to severe colitis found on CT scan and further work-up.  She was started on Keppra mentation has improved, she is on Rocephin and Flagyl for colitis.  No diarrhea.  Clinically much better.  Of note patient was never requiring Covid 19 quarantine as she was admitted beyond 21 days of initial Covid diagnosis.  Family and patient wished to be moved out of Covid unit.  Will be transferred off of Covid unit on 03/13/2019 so that family can visit her.   Subjective:   Patient in bed denies any headache, does have mild abdominal discomfort and pain which is generalized, no shortness of breath  or focal weakness.  No blood in stool or black-colored stool.   Assessment  & Plan :    1.  Toxic and metabolic encephalopathy.  Multiple falls at home, reported headache prior to admission.  Low-grade fever.  Seen by neurology, head CT unremarkable, MRI brain and LP unremarkable, EEG suspicious for epileptiform focus hence she has been placed on Keppra.  Patient is serially improving  and now she is close to baseline, no focal deficits, appears to have toxic encephalopathy with possible seizures in response to severe colitis found on CT scan, see #12 below.  2.  Recent COVID-19 pneumonia.  Fully treated.  She is out of quarantine period.   SpO2: 100 %  Recent Labs  Lab 03/11/19 0747 03/12/19 0245 03/13/19 0221 03/14/19 0456 03/14/19 1009 03/15/19 0448  CRP 30.1* 24.9* 15.3* 7.2*  --  4.2*  DDIMER 14.23* 17.05* 15.33* 10.76*  --  9.19*  FERRITIN  --   --   --   --  2,380*  --   BNP 347.6* 182.1* 307.2* 572.1*  --  338.2*    Hepatic Function Latest Ref Rng & Units 03/15/2019 03/14/2019 03/13/2019  Total Protein 6.5 - 8.1 g/dL 4.6(L) 4.6(L) 4.9(L)  Albumin 3.5 - 5.0 g/dL 1.5(L) 1.4(L) 1.5(L)  AST 15 - 41 U/L 18 13(L) 15  ALT 0 - 44 U/L 9 9 10   Alk Phosphatase 38 - 126 U/L 110 94 92  Total Bilirubin 0.3 - 1.2 mg/dL 0.3 0.6 0.8    3.  CT evidence of severe colitis found during her fever work-up on 03/12/2019.  History of diverticulitis in the past with surgical repair done at [facility] several years ago.  She did have diarrhea prior to hospital admission, no diarrhea reported now, currently placed on Rocephin and Flagyl with excellent clinical improvement, fevers have subsided, CRP is trending down, mentation has improved, has been placed on Soft diet.  As discussed with ID and GI, plan is to continue present antibiotics, repeat CT scan abdomen pelvis in 2-3 days.  If imaging shows sustained worsening then she might require colonoscopy.  4.  Urinary retention.  Flomax and Foley placed on the day  of admission.  Foley will be removed 03/15/2019 after enough bladder rest.  Monitor clinically.  5.  Hypothyroidism.  Stable TSH and cortisol, IV Synthroid.  Lab Results  Component Value Date   TSH 0.857 03/10/2019    6.  History of meningioma with craniotomy in the past.  Supportive care.  Head CT nonacute.  7.  Dyslipidemia.  Once taking oral medications consistently will start home dose statin.  8.  History of moderate to severe AS.  Supportive care.  No acute issues. Could be a TAVR candidate.  Outpatient cardiology follow-up.  9.  Elevated D-dimer with history of DVT.  Negative lower extremity venous duplex, chemical anticoagulation had to be stopped due to dropping H&H and subcutaneous hematomas in the abdomen on 03/14/2019.  Currently on SCDs.  10.  Anemia of chronic disease, slightly worse with heme dilution, hold further fluids, gentle hydration, placed on oral iron, no signs of active bleeding, has some abdominal soft tissue hematoma at the sites of heparin/Lovenox, hold chemical prophylaxis, stable anemia panel, if needed will give IV iron and Epogen.  Currently H&H has stabilized on oral iron supplementation and PPI, patient and family do not want any blood transfusions due to religious beliefs.  11. Hyperkalemia and Hypomagnesemia - treated and stable.  12.  Fungal UTI.  Received a dose of Diflucan IV.   13. CKD 4.  Baseline creatinine around 2.6.  Back to baseline after mild hydration.    Condition - Extremely Guarded  Family Communication  : Daughter updated in detail on 03/10/2019, updated again on 03/11/2019, 03/12/2019, 03/13/2019, 03/14/2019, 03/15/2019  Code Status : No intubation  Diet :    Diet Order  DIET DYS 3 Room service appropriate? Yes; Fluid consistency: Thin  Diet effective now               Disposition Plan  : Patient in PCU getting worked up for encephalopathy, sepsis now appears to be secondary to severe colitis.  Getting treatment for  colitis.  Consults  : Neurology, ID over the phone Dr. Graylon Good on 03/13/2019, GI  Procedures  :    CT head.  Nonacute.  EEG.  Final - This study showed evidence of epileptogenicity and cortical dysfunction in right frontocentral region. No seizures were seen throughout the recording.  Lower extremity venous duplex.  No DVT.  LP - 1 wbc  CT abdomen pelvis noncontrast- 1. Findings are concerning for severe colitis, as above. 2. Findings in the lungs which likely reflect evolving post infectious or inflammatory scarring given the patient's history of recent COVID-19 infection. 3. Aortic atherosclerosis, in addition to left main and 3 vessel coronary artery disease. 4. There are calcifications of the aortic valve. Echocardiographic correlation for evaluation of potential valvular dysfunction may be warranted if clinically indicated. 5. Additional incidental findings, as above.  MRI brain.  Nonacute.    PUD Prophylaxis : none  DVT Prophylaxis  :    Heparin switched to SCDs on 03/14/2019 due to continued anemia and abdominal soft tissue hematomas.  Lab Results  Component Value Date   PLT 346 03/15/2019    Inpatient Medications  Scheduled Meds: . famotidine  40 mg Oral BID  . ferrous sulfate  325 mg Oral TID WC  . insulin aspart  0-9 Units Subcutaneous TID WC  . levETIRAcetam  500 mg Oral BID  . levothyroxine  150 mcg Oral Q0600  . tamsulosin  0.4 mg Oral Daily   Continuous Infusions: . metronidazole 500 mg (03/15/19 0356)   PRN Meds:.acetaminophen, albuterol  Antibiotics  :    Anti-infectives (From admission, onward)   Start     Dose/Rate Route Frequency Ordered Stop   03/12/19 1200  metroNIDAZOLE (FLAGYL) IVPB 500 mg     500 mg 100 mL/hr over 60 Minutes Intravenous Every 8 hours 03/12/19 1048     03/12/19 1200  cefTRIAXone (ROCEPHIN) 2 g in sodium chloride 0.9 % 100 mL IVPB     2 g 200 mL/hr over 30 Minutes Intravenous Every 24 hours 03/12/19 1049 03/14/19 1241   03/12/19  1033  vancomycin variable dose per unstable renal function (pharmacist dosing)  Status:  Discontinued      Does not apply See admin instructions 03/12/19 1033 03/12/19 1048   03/11/19 2300  meropenem (MERREM) 1 g in sodium chloride 0.9 % 100 mL IVPB  Status:  Discontinued     1 g 200 mL/hr over 30 Minutes Intravenous Every 12 hours 03/11/19 2217 03/12/19 1048   03/11/19 1130  vancomycin (VANCOCIN) IVPB 1000 mg/200 mL premix  Status:  Discontinued     1,000 mg 200 mL/hr over 60 Minutes Intravenous Every 48 hours 03/11/19 1032 03/12/19 1033   03/11/19 1130  fluconazole (DIFLUCAN) IVPB 100 mg     100 mg 50 mL/hr over 60 Minutes Intravenous  Once 03/11/19 1035 03/12/19 0943   03/11/19 0600  acyclovir (ZOVIRAX) 800 mg in dextrose 5 % 150 mL IVPB  Status:  Discontinued     800 mg 166 mL/hr over 60 Minutes Intravenous Every 24 hours 03/10/19 1234 03/11/19 1715   03/10/19 1600  meropenem (MERREM) 1 g in sodium chloride 0.9 % 100 mL IVPB  Status:  Discontinued     1 g 200 mL/hr over 30 Minutes Intravenous Every 12 hours 03/10/19 1247 03/11/19 2217   03/10/19 0032  vancomycin variable dose per unstable renal function (pharmacist dosing)  Status:  Discontinued      Does not apply See admin instructions 03/10/19 0032 03/11/19 1032   03/09/19 2330  acyclovir (ZOVIRAX) 800 mg in dextrose 5 % 150 mL IVPB  Status:  Discontinued     800 mg 166 mL/hr over 60 Minutes Intravenous Every 8 hours 03/09/19 2321 03/10/19 1234   03/09/19 2330  meropenem (MERREM) 1 g in sodium chloride 0.9 % 100 mL IVPB  Status:  Discontinued     1 g 200 mL/hr over 30 Minutes Intravenous Every 8 hours 03/09/19 2324 03/10/19 1247   03/09/19 2330  vancomycin (VANCOREADY) IVPB 1500 mg/300 mL     1,500 mg 150 mL/hr over 120 Minutes Intravenous  Once 03/09/19 2324 03/10/19 0601       Time Spent in minutes  30   Lala Lund M.D on 03/15/2019 at 10:00 AM  To page go to [email or web address] - password [facility]  [facility] -  Office   [phone]    See all Orders from today for further details    Objective:   Vitals:   03/14/19 2126 03/14/19 2126 03/15/19 0538 03/15/19 0748  BP:  (!) 128/45 (!) 135/58 128/68  Pulse:  77 70 67  Resp:  16 12 15   Temp:  98.7 F (37.1 C) 98.9 F (37.2 C) 98.7 F (37.1 C)  TempSrc:  Oral Oral Oral  SpO2:  98% 97% 100%  Weight: 85 kg     Height:        Wt Readings from Last 3 Encounters:  03/14/19 85 kg  02/27/19 88 kg  01/12/19 82.1 kg     Intake/Output Summary (Last 24 hours) at 03/15/2019 1000 Last data filed at 03/15/2019 0748 Gross per 24 hour  Intake 1584.34 ml  Output 1277 ml  Net 307.34 ml     Physical Exam  Awake Alert, No new F.N deficits, Normal affect [place].AT,PERRAL Supple Neck,No JVD, No cervical lymphadenopathy appriciated.  Symmetrical Chest wall movement, Good air movement bilaterally, CTAB RRR,No Gallops, Rubs or new Murmurs, No Parasternal Heave +ve B.Sounds, Abd Soft, mild tenderness mostly generalized, subcutaneous hematoma stable No Cyanosis, Clubbing or edema,       Data Review:    CBC Recent Labs  Lab 03/11/19 0747 03/12/19 0245 03/13/19 0221 03/14/19 0456 03/15/19 0448  WBC 13.6* 11.8* 12.5* 8.4 7.8  HGB 8.6* 7.5* 7.1* 6.7* 7.1*  HCT 26.7* 23.2* 21.5* 20.7* 22.1*  PLT 288 298 295 303 346  MCV 90.2 88.9 88.1 88.8 91.3  MCH 29.1 28.7 29.1 28.8 29.3  MCHC 32.2 32.3 33.0 32.4 32.1  RDW 18.2* 18.2* 17.8* 17.9* 17.9*  LYMPHSABS 1.3 1.2 1.5 1.8 2.1  MONOABS 0.4 0.5 0.5 0.3 0.4  EOSABS 0.2 0.5 0.6* 0.5 0.4  BASOSABS 0.1 0.0 0.0 0.0 0.0    Chemistries  Recent Labs  Lab 03/11/19 0747 03/12/19 0245 03/13/19 0221 03/14/19 0456 03/15/19 0448  NA 143 143 144 144 142  K 4.1 3.7 4.1 3.7 3.9  CL 112* 112* 114* 114* 111  CO2 18* 21* 21* 21* 19*  GLUCOSE 161* 127* 123* 106* 107*  BUN 23 27* 27* 26* 24*  CREATININE 2.94* 2.94* 2.55* 2.32* 2.23*  CALCIUM 8.1* 8.2* 8.1* 8.0* 8.0*  MG 2.0 2.0 1.9 1.7 1.6*  AST 16 13* 15 13*  18   ALT 12 11 10 9 9   ALKPHOS 105 94 92 94 110  BILITOT 0.7 0.8 0.8 0.6 0.3   ------------------------------------------------------------------------------------------------------------------ No results for input(s): CHOL, HDL, LDLCALC, TRIG, CHOLHDL, LDLDIRECT in the last 72 hours.  Lab Results  Component Value Date   HGBA1C 6.6 (H) 02/15/2019   ------------------------------------------------------------------------------------------------------------------ No results for input(s): TSH, T4TOTAL, T3FREE, THYROIDAB in the last 72 hours.  Invalid input(s): FREET3  Cardiac Enzymes No results for input(s): CKMB, TROPONINI, MYOGLOBIN in the last 168 hours.  Invalid input(s): CK ------------------------------------------------------------------------------------------------------------------    Component Value Date/Time   BNP 338.2 (H) 03/15/2019 0448    Micro Results Recent Results (from the past 240 hour(s))  Blood Culture (routine x 2)     Status: None   Collection Time: 03/09/19  7:44 PM   Specimen: BLOOD  Result Value Ref Range Status   Specimen Description BLOOD RIGHT ANTECUBITAL  Final   Special Requests   Final    BOTTLES DRAWN AEROBIC AND ANAEROBIC Blood Culture results may not be optimal due to an inadequate volume of blood received in culture bottles   Culture   Final    NO GROWTH 5 DAYS Performed at [facility], [place] [address], [place], [place] [address]    Report Status 03/14/2019 FINAL  Final  Blood Culture (routine x 2)     Status: None   Collection Time: 03/09/19  7:49 PM   Specimen: BLOOD  Result Value Ref Range Status   Specimen Description BLOOD LEFT ANTECUBITAL  Final   Special Requests   Final    BOTTLES DRAWN AEROBIC AND ANAEROBIC Blood Culture results may not be optimal due to an inadequate volume of blood received in culture bottles   Culture   Final    NO GROWTH 5 DAYS Performed at [facility], [place] [address], [place], [place]  [address]    Report Status 03/14/2019 FINAL  Final  Urine culture     Status: Abnormal   Collection Time: 03/09/19  8:00 PM   Specimen: In/Out Cath Urine  Result Value Ref Range Status   Specimen Description IN/OUT CATH URINE  Final   Special Requests   Final    NONE Performed at [facility], [place] [address], [place], [place] [address]    Culture >=100,000 COLONIES/mL YEAST (A)  Final   Report Status 03/10/2019 FINAL  Final  CSF culture     Status: None   Collection Time: 03/11/19  2:48 PM   Specimen: PATH Cytology CSF; Cerebrospinal Fluid  Result Value Ref Range Status   Specimen Description CSF  Final   Special Requests NONE  Final   Gram Stain   Final    WBC PRESENT,BOTH PMN AND MONONUCLEAR NO ORGANISMS SEEN CYTOSPIN SMEAR    Culture   Final    NO GROWTH 3 DAYS Performed at [facility], [place] [address] [place], [place] [address]    Report Status 03/14/2019 FINAL  Final  MRSA PCR Screening     Status: None   Collection Time: 03/12/19  9:29 AM   Specimen: Nasal Mucosa; Nasopharyngeal  Result Value Ref Range Status   MRSA by PCR NEGATIVE NEGATIVE Final    Comment:        The GeneXpert MRSA Assay (FDA approved for NASAL specimens only), is one component of a comprehensive MRSA colonization surveillance program. It is not intended to diagnose MRSA infection nor to guide or monitor treatment for MRSA  infections. Performed at [facility], [place] [address], [place], [place] [address]     Radiology Reports CT ABDOMEN PELVIS WO CONTRAST  Result Date: 03/12/2019 CLINICAL DATA:  79 year old female with history of COVID-19 pneumonia. Generalized weakness and diarrhea. EXAM: CT CHEST, ABDOMEN AND PELVIS WITHOUT CONTRAST TECHNIQUE: Multidetector CT imaging of the chest, abdomen and pelvis was performed following the standard protocol without IV contrast. COMPARISON:  Chest CT 08/05/2016.  CT the abdomen 01/27/2006. FINDINGS: CT CHEST FINDINGS Cardiovascular: Heart  size is normal. There is no significant pericardial fluid, thickening or pericardial calcification. There is aortic atherosclerosis, as well as atherosclerosis of the great vessels of the mediastinum and the coronary arteries, including calcified atherosclerotic plaque in the left main, left anterior descending, left circumflex and right coronary arteries. Severe calcifications of the aortic valve. Mediastinum/Nodes: No pathologically enlarged mediastinal or hilar lymph nodes. Please note that accurate exclusion of hilar adenopathy is limited on noncontrast CT scans. Esophagus is unremarkable in appearance. No axillary lymphadenopathy. Lungs/Pleura: Patchy areas of ground-glass attenuation, septal thickening, thickening of the peribronchovascular interstitium and regional architectural distortion in the mid to lower lungs bilaterally, likely to reflect mild post infectious or inflammatory scarring in this patient with history of recent COVID-19 infection. Trace bilateral pleural effusions lying dependently. No suspicious appearing pulmonary nodules or masses are noted. Musculoskeletal: There are no aggressive appearing lytic or blastic lesions noted in the visualized portions of the skeleton. CT ABDOMEN PELVIS FINDINGS Hepatobiliary: 1.4 cm low-attenuation lesion in segment 4A of the liver, incompletely characterized on today's non-contrast CT examination, but similar to the prior study and most compatible with a cyst. No other suspicious hepatic lesions. Unenhanced appearance of the gallbladder is unremarkable. Pancreas: No definite pancreatic mass or peripancreatic fluid collections or inflammatory changes are noted on today's noncontrast CT examination. Spleen: Unremarkable. Adrenals/Urinary Tract: Exophytic 2.4 cm low-attenuation lesion in the lower pole of the left kidney, incompletely characterized on today's noncontrast CT examination but statistically likely to represent a cyst. Unenhanced appearance of the  right kidney and bilateral adrenal glands are normal in appearance. No hydroureteronephrosis. Urinary bladder is nearly decompressed with an indwelling Foley balloon catheter. Small amount of gas in the non dependent portion of the urinary bladder is iatrogenic. Stomach/Bowel: Unenhanced appearance of the stomach is normal. No pathologic dilatation of small bowel or colon. Suture line near the rectosigmoid junction from prior partial bowel resection. Adjacent haziness and soft tissue stranding in the presacral soft tissues may be related to prior radiation therapy. Extensive mural thickening throughout the colon most severe from the transverse colon to the proximal sigmoid colon where there are surrounding inflammatory changes, compatible with a colitis. Vascular/Lymphatic: Aortic atherosclerosis. No lymphadenopathy noted in the abdomen or pelvis. Reproductive: Status post hysterectomy. Ovaries are not confidently identified may be surgically absent or atrophic. Other: No significant volume of ascites.  No pneumoperitoneum. Musculoskeletal: There are no aggressive appearing lytic or blastic lesions noted in the visualized portions of the skeleton. IMPRESSION: 1. Findings are concerning for severe colitis, as above. 2. Findings in the lungs which likely reflect evolving post infectious or inflammatory scarring given the patient's history of recent COVID-19 infection. 3. Aortic atherosclerosis, in addition to left main and 3 vessel coronary artery disease. 4. There are calcifications of the aortic valve. Echocardiographic correlation for evaluation of potential valvular dysfunction may be warranted if clinically indicated. 5. Additional incidental findings, as above. Electronically Signed   By: Vinnie Langton M.D.   On: 03/12/2019 09:49  CT Head Wo Contrast  Result Date: 03/09/2019 CLINICAL DATA:  Altered mental status and increasing confusion, history of COVID-19 positivity EXAM: CT HEAD WITHOUT CONTRAST  TECHNIQUE: Contiguous axial images were obtained from the base of the skull through the vertex without intravenous contrast. COMPARISON:  02/10/2018 FINDINGS: Brain: Mild atrophic changes and chronic white matter ischemic changes are seen stable from the prior exam. No findings to suggest acute hemorrhage, acute infarction or space-occupying mass lesion are noted. Vascular: No hyperdense vessel or unexpected calcification. Skull: Changes consistent with prior craniotomy are noted in right temporoparietal region Sinuses/Orbits: No acute finding. Other: None. IMPRESSION: Chronic atrophic and ischemic changes without acute abnormality. Electronically Signed   By: Inez Catalina M.D.   On: 03/09/2019 21:10   CT CHEST WO CONTRAST  Result Date: 03/12/2019 CLINICAL DATA:  79 year old female with history of COVID-19 pneumonia. Generalized weakness and diarrhea. EXAM: CT CHEST, ABDOMEN AND PELVIS WITHOUT CONTRAST TECHNIQUE: Multidetector CT imaging of the chest, abdomen and pelvis was performed following the standard protocol without IV contrast. COMPARISON:  Chest CT 08/05/2016.  CT the abdomen 01/27/2006. FINDINGS: CT CHEST FINDINGS Cardiovascular: Heart size is normal. There is no significant pericardial fluid, thickening or pericardial calcification. There is aortic atherosclerosis, as well as atherosclerosis of the great vessels of the mediastinum and the coronary arteries, including calcified atherosclerotic plaque in the left main, left anterior descending, left circumflex and right coronary arteries. Severe calcifications of the aortic valve. Mediastinum/Nodes: No pathologically enlarged mediastinal or hilar lymph nodes. Please note that accurate exclusion of hilar adenopathy is limited on noncontrast CT scans. Esophagus is unremarkable in appearance. No axillary lymphadenopathy. Lungs/Pleura: Patchy areas of ground-glass attenuation, septal thickening, thickening of the peribronchovascular interstitium and regional  architectural distortion in the mid to lower lungs bilaterally, likely to reflect mild post infectious or inflammatory scarring in this patient with history of recent COVID-19 infection. Trace bilateral pleural effusions lying dependently. No suspicious appearing pulmonary nodules or masses are noted. Musculoskeletal: There are no aggressive appearing lytic or blastic lesions noted in the visualized portions of the skeleton. CT ABDOMEN PELVIS FINDINGS Hepatobiliary: 1.4 cm low-attenuation lesion in segment 4A of the liver, incompletely characterized on today's non-contrast CT examination, but similar to the prior study and most compatible with a cyst. No other suspicious hepatic lesions. Unenhanced appearance of the gallbladder is unremarkable. Pancreas: No definite pancreatic mass or peripancreatic fluid collections or inflammatory changes are noted on today's noncontrast CT examination. Spleen: Unremarkable. Adrenals/Urinary Tract: Exophytic 2.4 cm low-attenuation lesion in the lower pole of the left kidney, incompletely characterized on today's noncontrast CT examination but statistically likely to represent a cyst. Unenhanced appearance of the right kidney and bilateral adrenal glands are normal in appearance. No hydroureteronephrosis. Urinary bladder is nearly decompressed with an indwelling Foley balloon catheter. Small amount of gas in the non dependent portion of the urinary bladder is iatrogenic. Stomach/Bowel: Unenhanced appearance of the stomach is normal. No pathologic dilatation of small bowel or colon. Suture line near the rectosigmoid junction from prior partial bowel resection. Adjacent haziness and soft tissue stranding in the presacral soft tissues may be related to prior radiation therapy. Extensive mural thickening throughout the colon most severe from the transverse colon to the proximal sigmoid colon where there are surrounding inflammatory changes, compatible with a colitis.  Vascular/Lymphatic: Aortic atherosclerosis. No lymphadenopathy noted in the abdomen or pelvis. Reproductive: Status post hysterectomy. Ovaries are not confidently identified may be surgically absent or atrophic. Other: No significant volume of ascites.  No pneumoperitoneum. Musculoskeletal: There are no aggressive appearing lytic or blastic lesions noted in the visualized portions of the skeleton. IMPRESSION: 1. Findings are concerning for severe colitis, as above. 2. Findings in the lungs which likely reflect evolving post infectious or inflammatory scarring given the patient's history of recent COVID-19 infection. 3. Aortic atherosclerosis, in addition to left main and 3 vessel coronary artery disease. 4. There are calcifications of the aortic valve. Echocardiographic correlation for evaluation of potential valvular dysfunction may be warranted if clinically indicated. 5. Additional incidental findings, as above. Electronically Signed   By: Vinnie Langton M.D.   On: 03/12/2019 09:49   MR BRAIN WO CONTRAST  Result Date: 03/11/2019 CLINICAL DATA:  Encephalopathy, history of meningioma resection EXAM: MRI HEAD WITHOUT CONTRAST TECHNIQUE: Multiplanar, multiecho pulse sequences of the brain and surrounding structures were obtained without intravenous contrast. COMPARISON:  2018 FINDINGS: Motion artifact is present. Brain: There is no acute infarction or intracranial hemorrhage. There is no intracranial mass, mass effect, or edema. There is no hydrocephalus or extra-axial fluid collection. Patchy T2 hyperintensity in the supratentorial white matter is nonspecific but probably reflects stable chronic microvascular ischemic changes. Vascular: Major vessel flow voids at the skull base are preserved. Skull and upper cervical spine: Right frontal craniotomy. Normal marrow signal is preserved. Sinuses/Orbits: Paranasal sinuses are aerated. No significant orbital abnormality. Other: Sella is unremarkable.  Mastoid air  cells are clear. IMPRESSION: Motion degraded. No acute infarction or other acute abnormality. Stable chronic microvascular ischemic changes. Electronically Signed   By: Macy Mis M.D.   On: 03/11/2019 13:03   US RENAL  Result Date: 02/16/2019 CLINICAL DATA:  Acute kidney injury. EXAM: RENAL / URINARY TRACT ULTRASOUND COMPLETE COMPARISON:  None. FINDINGS: Right Kidney: Renal measurements: 8.7 x 5.3 x 5.2 cm = volume: 123 mL. 1.6 cm cyst is noted in upper pole. Increased echogenicity of renal parenchyma is noted. No mass or hydronephrosis visualized. Left Kidney: Renal measurements: 10.4 x 5.8 x 5.8 cm = volume: 183 mL. 2.1 cm cyst is seen in lower pole. Increased echogenicity of renal parenchyma is noted. No mass or hydronephrosis visualized. Bladder: Appears normal for degree of bladder distention. Other: None. IMPRESSION: Increased echogenicity of renal parenchyma is noted bilaterally consistent with medical renal disease. No hydronephrosis or renal obstruction is noted. Electronically Signed   By: Marijo Conception M.D.   On: 02/16/2019 09:43   DG Chest Portable 1 View  Result Date: 03/09/2019 CLINICAL DATA:  Altered mental status. Possible pulmonary infection. EXAM: PORTABLE CHEST 1 VIEW COMPARISON:  February 14, 2019. FINDINGS: Stable mild cardiomegaly. Central pulmonary vascular congestion without overt pulmonary edema or obvious pleural effusion. Low lung volumes. Linear atelectasis in the left lower lung zone. No pneumonia. Moderate skeletal degenerative change and telemetry leads. IMPRESSION: Stable mild cardiomegaly with central pulmonary vascular congestion. No pneumonia or pulmonary edema. Electronically Signed   By: Revonda Humphrey   On: 03/09/2019 21:41   DG Chest Port 1 View  Result Date: 02/14/2019 CLINICAL DATA:  Sepsis EXAM: PORTABLE CHEST 1 VIEW COMPARISON:  02/10/2018 FINDINGS: Heart and mediastinal contours are within normal limits. No focal opacities or effusions. No acute bony  abnormality. IMPRESSION: No active disease. Electronically Signed   By: Rolm Baptise M.D.   On: 02/14/2019 18:29   Overnight EEG with video  Result Date: 03/10/2019 Lora Havens, MD     03/11/2019  9:03 AM Patient Name: JANAL HAAK MRN: [id number] Epilepsy Attending: Lora Havens Referring Physician/Provider:  Dr Roland Rack Duration: 03/09/2019 2308 to 03/10/2019 2308 Patient history: 79 year old female presenting with polymyoclonus, fever of unclear etiology and altered mental status. EEG to evaluate for seizure. Level of alertness: awake/lethargic AEDs during EEG study: None Technical aspects: This EEG study was done with scalp electrodes positioned according to the 10-20 International system of electrode placement. Electrical activity was acquired at a sampling rate of 500Hz  and reviewed with a high frequency filter of 70Hz  and a low frequency filter of 1Hz . EEG data were recorded continuously and digitally stored. DESCRIPTION: No clear posterior dominant rhythm was seen. EEG showed continuous generalized and maximal right frontocentral region. Sharp waves were also seen in right frontocentral region. Hyperventilation and photic stimulation were not performed. Event button was pressed on 03/10/2019 at 1016 for unclear reason. Cocomitant eeg before, during and after the event didn't show any eeg change to suggest seizure. 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 No seizures were seen throughout the recording. Event button was pressed on 03/10/2019 at 1016 for unclear reason. Cocomitant eeg before, during and after the event didn't show any eeg change to suggest seizure. Priyanka O Yadav   VAS [place] LOWER EXTREMITY VENOUS (DVT)  Result Date: 03/10/2019  Lower Venous DVTStudy Indications: Elevated Ddimer.  Risk  Factors: COVID 19 positive. Comparison Study: No prior studies. Performing Technologist: Oliver Hum RVT  Examination Guidelines: A complete evaluation includes B-mode imaging, spectral Doppler, color Doppler, and power Doppler as needed of all accessible portions of each vessel. Bilateral testing is considered an integral part of a complete examination. Limited examinations for reoccurring indications may be performed as noted. The reflux portion of the exam is performed with the patient in reverse Trendelenburg.  +---------+---------------+---------+-----------+----------+--------------+ RIGHT    CompressibilityPhasicitySpontaneityPropertiesThrombus Aging +---------+---------------+---------+-----------+----------+--------------+ CFV      Full           Yes      Yes                                 +---------+---------------+---------+-----------+----------+--------------+ SFJ      Full                                                        +---------+---------------+---------+-----------+----------+--------------+ FV Prox  Full                                                        +---------+---------------+---------+-----------+----------+--------------+ FV Mid   Full                                                        +---------+---------------+---------+-----------+----------+--------------+ FV DistalFull                                                        +---------+---------------+---------+-----------+----------+--------------+  PFV      Full                                                        +---------+---------------+---------+-----------+----------+--------------+ POP      Full           Yes      Yes                                 +---------+---------------+---------+-----------+----------+--------------+ PTV      Full                                                         +---------+---------------+---------+-----------+----------+--------------+ PERO     Full                                                        +---------+---------------+---------+-----------+----------+--------------+   +---------+---------------+---------+-----------+----------+--------------+ LEFT     CompressibilityPhasicitySpontaneityPropertiesThrombus Aging +---------+---------------+---------+-----------+----------+--------------+ CFV      Full           Yes      Yes                                 +---------+---------------+---------+-----------+----------+--------------+ SFJ      Full                                                        +---------+---------------+---------+-----------+----------+--------------+ FV Prox  Full                                                        +---------+---------------+---------+-----------+----------+--------------+ FV Mid   Full                                                        +---------+---------------+---------+-----------+----------+--------------+ FV Distal               Yes      Yes                                 +---------+---------------+---------+-----------+----------+--------------+ PFV      Full                                                        +---------+---------------+---------+-----------+----------+--------------+  POP      Full           Yes      Yes                                 +---------+---------------+---------+-----------+----------+--------------+ PTV      Full                                                        +---------+---------------+---------+-----------+----------+--------------+ PERO     Full                                                        +---------+---------------+---------+-----------+----------+--------------+     Summary: RIGHT: - There is no evidence of deep vein thrombosis in the lower extremity. However, portions of this  examination were limited- see technologist comments above.  - No cystic structure found in the popliteal fossa.  LEFT: - There is no evidence of deep vein thrombosis in the lower extremity. However, portions of this examination were limited- see technologist comments above.  - No cystic structure found in the popliteal fossa.  *See table(s) above for measurements and observations. Electronically signed by Servando Snare MD on 03/10/2019 at 4:08:34 PM.    Final    VAS [place] LOWER EXTREMITY VENOUS (DVT)  Result Date: 02/22/2019  Lower Venous DVTStudy Indications: Hx of DVT.  Limitations: Body habitus and sunlight in the room, uncooperative patient. Performing Technologist: Antonieta Pert RDMS, RVT  Examination Guidelines: A complete evaluation includes B-mode imaging, spectral Doppler, color Doppler, and power Doppler as needed of all accessible portions of each vessel. Bilateral testing is considered an integral part of a complete examination. Limited examinations for reoccurring indications may be performed as noted. The reflux portion of the exam is performed with the patient in reverse Trendelenburg.  +---------+---------------+---------+-----------+----------+-------------------+ RIGHT    CompressibilityPhasicitySpontaneityPropertiesThrombus Aging      +---------+---------------+---------+-----------+----------+-------------------+ CFV      Full           Yes      Yes                                      +---------+---------------+---------+-----------+----------+-------------------+ SFJ      Full                                                             +---------+---------------+---------+-----------+----------+-------------------+ FV Prox  Full                                                             +---------+---------------+---------+-----------+----------+-------------------+ FV Mid   Full                                                              +---------+---------------+---------+-----------+----------+-------------------+  FV DistalFull                                                             +---------+---------------+---------+-----------+----------+-------------------+ PFV      Full                                                             +---------+---------------+---------+-----------+----------+-------------------+ POP                                                   Not visualized      +---------+---------------+---------+-----------+----------+-------------------+ PTV                                                   poor visualization                                                        due to pt                                                                 cooperation and                                                           tolerance.          +---------+---------------+---------+-----------+----------+-------------------+ PERO                                                  poor visualization                                                        due to pt  cooperation and                                                           tolerance.          +---------+---------------+---------+-----------+----------+-------------------+ GSV      Full                                                             +---------+---------------+---------+-----------+----------+-------------------+   +---------+---------------+---------+-----------+----------+-------------------+ LEFT     CompressibilityPhasicitySpontaneityPropertiesThrombus Aging      +---------+---------------+---------+-----------+----------+-------------------+ CFV      Full           Yes      Yes                                      +---------+---------------+---------+-----------+----------+-------------------+  SFJ      Full                                                             +---------+---------------+---------+-----------+----------+-------------------+ FV Prox  Full                                                             +---------+---------------+---------+-----------+----------+-------------------+ FV Mid   Full                                                             +---------+---------------+---------+-----------+----------+-------------------+ FV Distal                                             Not visualized      +---------+---------------+---------+-----------+----------+-------------------+ PFV      Full                                                             +---------+---------------+---------+-----------+----------+-------------------+ POP                     Yes      Yes                  unable to compress  due to pt                                                                 cooperation and                                                           tolerance.          +---------+---------------+---------+-----------+----------+-------------------+ PTV                                                   not well visualized +---------+---------------+---------+-----------+----------+-------------------+ PERO                                                  not well visualized +---------+---------------+---------+-----------+----------+-------------------+ GSV      Full                                                             +---------+---------------+---------+-----------+----------+-------------------+     Summary: RIGHT: - There is no evidence of deep vein thrombosis in the lower extremity. However, portions of this examination were limited- see technologist comments above.  - A cystic structure is found in the popliteal fossa.  LEFT: - There is  no evidence of deep vein thrombosis in the lower extremity. However, portions of this examination were limited- see technologist comments above.  - No cystic structure found in the popliteal fossa.  *See table(s) above for measurements and observations. Electronically signed by Deitra Mayo MD on 02/22/2019 at 4:12:36 PM.    Final    VAS [place] LOWER EXTREMITY VENOUS (DVT)  Result Date: 02/17/2019  Lower Venous DVTStudy Indications: Elevated ddimer.  Limitations: Body habitus, poor ultrasound/tissue interface and pain tolerance. Comparison Study: 02/10/18 previous Performing Technologist: Abram Sander RVS  Examination Guidelines: A complete evaluation includes B-mode imaging, spectral Doppler, color Doppler, and power Doppler as needed of all accessible portions of each vessel. Bilateral testing is considered an integral part of a complete examination. Limited examinations for reoccurring indications may be performed as noted. The reflux portion of the exam is performed with the patient in reverse Trendelenburg.  +---------+---------------+---------+-----------+----------+--------------+ RIGHT    CompressibilityPhasicitySpontaneityPropertiesThrombus Aging +---------+---------------+---------+-----------+----------+--------------+ CFV      Full           Yes      Yes                                 +---------+---------------+---------+-----------+----------+--------------+ SFJ      Full                                                        +---------+---------------+---------+-----------+----------+--------------+  FV Prox  Full                                                        +---------+---------------+---------+-----------+----------+--------------+ FV Mid   Full                                                        +---------+---------------+---------+-----------+----------+--------------+ FV Distal               Yes      Yes                                  +---------+---------------+---------+-----------+----------+--------------+ PFV      Full                                                        +---------+---------------+---------+-----------+----------+--------------+ POP      Full           Yes      Yes                                 +---------+---------------+---------+-----------+----------+--------------+ PTV      Full                                                        +---------+---------------+---------+-----------+----------+--------------+ PERO                                                  Not visualized +---------+---------------+---------+-----------+----------+--------------+   +---------+---------------+---------+-----------+----------+--------------+ LEFT     CompressibilityPhasicitySpontaneityPropertiesThrombus Aging +---------+---------------+---------+-----------+----------+--------------+ CFV      Full           Yes      Yes                                 +---------+---------------+---------+-----------+----------+--------------+ SFJ      Full                                                        +---------+---------------+---------+-----------+----------+--------------+ FV Prox  Full                                                        +---------+---------------+---------+-----------+----------+--------------+  FV Mid   Full                                                        +---------+---------------+---------+-----------+----------+--------------+ FV Distal               Yes      Yes                                 +---------+---------------+---------+-----------+----------+--------------+ PFV      Full                                                        +---------+---------------+---------+-----------+----------+--------------+ POP      Full           Yes      Yes                                  +---------+---------------+---------+-----------+----------+--------------+ PTV      Full                                                        +---------+---------------+---------+-----------+----------+--------------+ PERO                                                  Not visualized +---------+---------------+---------+-----------+----------+--------------+     Summary: BILATERAL: - No evidence of deep vein thrombosis seen in the lower extremities, bilaterally.   *See table(s) above for measurements and observations. Electronically signed by Harold Barban MD on 02/17/2019 at 7:28:13 AM.    Final    DG FL GUIDED LUMBAR PUNCTURE  Result Date: 03/11/2019 CLINICAL DATA:  Mental status changes.  COVID-19 positive. EXAM: DIAGNOSTIC LUMBAR PUNCTURE UNDER FLUOROSCOPIC GUIDANCE FLUOROSCOPY TIME:  Fluoroscopy Time:  1 minutes and 24 seconds Radiation Exposure Index (if provided by the fluoroscopic device): Not applicable. Number of Acquired Spot Images: 0 PROCEDURE: Informed consent was obtained from the patient's daughter prior to the procedure, including potential complications of bleeding and infection. With the patient prone, the lower back was prepped with Betadine. 1% Lidocaine was used for local anesthesia. Lumbar puncture was performed at the L4/5 level using a 20 gauge needle with return of initially blood tinged CSF with an opening pressure of 9 cm water. 9 ml of CSF were obtained for laboratory studies. The patient tolerated the procedure well and there were no apparent complications. IMPRESSION: Successful lumbar puncture under fluoroscopic guidance, as detailed above. Electronically Signed   By: Abigail Miyamoto M.D.   On: 03/11/2019 15:03

## 2019-03-15 NOTE — Progress Notes (Signed)
SLP Cancellation Note  Patient Details Name: [name] MRN: [id number] DOB: [birth year]   Cancelled treatment:       Reason Eval/Treat Not Completed: Fatigue/lethargy limiting ability to participate Patient sleeping upon ST entry. Patient's family member present at bedside, family member reporting patient seems to have low appetite, is not eating very much. Family member requested ST allow patient to sleep. ST will continue efforts.  Josiel Gahm 03/15/2019, 3:44 PM  Marina Goodell, M.Ed., [place] [facility] [phone]: Acute Rehab office [phone] - pager

## 2019-03-16 DIAGNOSIS — R339 Retention of urine, unspecified: Secondary | ICD-10-CM

## 2019-03-16 LAB — COMPREHENSIVE METABOLIC PANEL
ALT: 12 U/L (ref 0–44)
AST: 30 U/L (ref 15–41)
Albumin: 1.8 g/dL — ABNORMAL LOW (ref 3.5–5.0)
Alkaline Phosphatase: 150 U/L — ABNORMAL HIGH (ref 38–126)
Anion gap: 10 (ref 5–15)
BUN: 24 mg/dL — ABNORMAL HIGH (ref 8–23)
CO2: 20 mmol/L — ABNORMAL LOW (ref 22–32)
Calcium: 8.4 mg/dL — ABNORMAL LOW (ref 8.9–10.3)
Chloride: 114 mmol/L — ABNORMAL HIGH (ref 98–111)
Creatinine, Ser: 2.14 mg/dL — ABNORMAL HIGH (ref 0.44–1.00)
GFR calc Af Amer: 25 mL/min — ABNORMAL LOW (ref >60)
GFR calc non Af Amer: 21 mL/min — ABNORMAL LOW (ref >60)
Glucose, Bld: 119 mg/dL — ABNORMAL HIGH (ref 70–99)
Potassium: 3.8 mmol/L (ref 3.5–5.1)
Sodium: 144 mmol/L (ref 135–145)
Total Bilirubin: 0.5 mg/dL (ref 0.3–1.2)
Total Protein: 5.3 g/dL — ABNORMAL LOW (ref 6.5–8.1)

## 2019-03-16 LAB — BRAIN NATRIURETIC PEPTIDE: B Natriuretic Peptide: 295.4 pg/mL — ABNORMAL HIGH (ref 0.0–100.0)

## 2019-03-16 LAB — CBC WITH DIFFERENTIAL/PLATELET
Abs Immature Granulocytes: 0 10*3/uL (ref 0.00–0.07)
Basophils Absolute: 0.1 10*3/uL (ref 0.0–0.1)
Basophils Relative: 1 %
Eosinophils Absolute: 0.3 10*3/uL (ref 0.0–0.5)
Eosinophils Relative: 4 %
HCT: 26.4 % — ABNORMAL LOW (ref 36.0–46.0)
Hemoglobin: 8.4 g/dL — ABNORMAL LOW (ref 12.0–15.0)
Lymphocytes Relative: 24 %
Lymphs Abs: 1.9 10*3/uL (ref 0.7–4.0)
MCH: 28.6 pg (ref 26.0–34.0)
MCHC: 31.8 g/dL (ref 30.0–36.0)
MCV: 89.8 fL (ref 80.0–100.0)
Monocytes Absolute: 0.3 10*3/uL (ref 0.1–1.0)
Monocytes Relative: 4 %
Neutro Abs: 5.4 10*3/uL (ref 1.7–7.7)
Neutrophils Relative %: 67 %
Platelets: 387 10*3/uL (ref 150–400)
RBC: 2.94 MIL/uL — ABNORMAL LOW (ref 3.87–5.11)
RDW: 18 % — ABNORMAL HIGH (ref 11.5–15.5)
WBC: 8.1 10*3/uL (ref 4.0–10.5)
nRBC: 1.1 % — ABNORMAL HIGH (ref 0.0–0.2)
nRBC: 4 /100 WBC — ABNORMAL HIGH

## 2019-03-16 LAB — C-REACTIVE PROTEIN: CRP: 2.2 mg/dL — ABNORMAL HIGH (ref <1.0)

## 2019-03-16 LAB — GLUCOSE, CAPILLARY
Glucose-Capillary: 108 mg/dL — ABNORMAL HIGH (ref 70–99)
Glucose-Capillary: 234 mg/dL — ABNORMAL HIGH (ref 70–99)
Glucose-Capillary: 110 mg/dL — ABNORMAL HIGH (ref 70–99)
Glucose-Capillary: 127 mg/dL — ABNORMAL HIGH (ref 70–99)

## 2019-03-16 LAB — D-DIMER, QUANTITATIVE: D-Dimer, Quant: 13.62 ug/mL-FEU — ABNORMAL HIGH (ref 0.00–0.50)

## 2019-03-16 LAB — MAGNESIUM: Magnesium: 2 mg/dL (ref 1.7–2.4)

## 2019-03-16 LAB — PATHOLOGIST SMEAR REVIEW: Path Review: 3102021

## 2019-03-16 MED ORDER — ACETAMINOPHEN 325 MG PO TABS
650.0000 mg | ORAL_TABLET | Freq: Four times a day (QID) | ORAL | Status: DC | PRN
  Administered 2019-03-16: 650 mg via ORAL
  Filled 2019-03-16: qty 2

## 2019-03-16 MED ORDER — FAMOTIDINE 20 MG PO TABS
20.0000 mg | ORAL_TABLET | Freq: Every day | ORAL | Status: DC
  Administered 2019-03-17 – 2019-03-22 (×6): 20 mg via ORAL
  Filled 2019-03-16 (×6): qty 1

## 2019-03-16 NOTE — Plan of Care (Signed)
  Problem: Activity: Goal: Risk for activity intolerance will decrease Outcome: Progressing   Problem: Nutrition: Goal: Adequate nutrition will be maintained Outcome: Progressing   Problem: Elimination: Goal: Will not experience complications related to bowel motility Outcome: Progressing   
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
## 2019-03-16 NOTE — Progress Notes (Signed)
  Speech Language Pathology Treatment: Dysphagia  Patient Details Name: [name] MRN: [id number] DOB: 15-Apr-1940 Today's Date: 03/16/2019 Time: 2841-3244 SLP Time Calculation (min) (ACUTE ONLY): 13 min  Assessment / Plan / Recommendation Clinical Impression  Pt sleeping upon ST arrival, she was able to be roused with verbal cues, though she demonstrated lethargy throughout session. Pt edentulous, dentures not placed when seen today. Patient seen with straw sips of thin liquids, no overt s/sx aspiration. Patient agreeable to one bite of spaghetti/chopped meat from D3 lunch tray and one bite of cooked green beans. Patient with prolonged mastication (likely 2/2 edentulous status), but able to form bolus and initiate the swallow. Min oral residue which cleared with liquid rinse. No overt s/sx aspiration. Patient refused further trials, refused puree trials and repeatedly stated "I don't want anything." ST provided edu re: rationale for wanting to see patient eat more items, offered several items of varying consistencies, patient continued to decline all items offered.  ST to sign off for now as oropharyngeal swallow function appears to be grossly [facility]. Mastication somewhat limited d/t edentulous status (dentures at home per previous notes), thus recommend patient continue dysphagia 3 solids/thin liquids. Please re-consult should patient demonstrate s/sx dysphagia.    HPI HPI: 79 yo female adm to [facility] with acute encephalopathy.  Pt with h/o menigioma s/p frontal craniotomy, HTN, chronic anemia, RA, DM, aortic stenosis and HLD.  Pt with CXR 03/09/2019 stable cardiomegaly with centeral pulmonary vascular congestion. 03/09/2019 chronic ischemia.  Pt is for a LP in IR today.      SLP Plan  Discharge SLP treatment due to (comment)       Recommendations  Diet recommendations: Dysphagia 3 (mechanical soft);Thin liquid Medication Administration: Crushed with puree Supervision: Staff to assist with  self feeding;Full supervision/cueing for compensatory strategies Compensations: Minimize environmental distractions;Slow rate;Small sips/bites Postural Changes and/or Swallow Maneuvers: Seated upright 90 degrees                Oral Care Recommendations: Oral care BID Follow up Recommendations: None;Skilled Nursing facility SLP Visit Diagnosis: Dysphagia, unspecified (R13.10) Plan: Discharge SLP treatment due to (comment)       [place] 03/16/2019, 1:04 PM  Marina Goodell, M.Ed., [place] [facility] [phone]: Acute Rehab office [phone] - pager

## 2019-03-16 NOTE — Progress Notes (Addendum)
PROGRESS NOTE    [name]  IPJ:[id number] DOB: 04/30/1940 DOA: 03/09/2019 PCP: Jonathon Jordan, MD   Brief Narrative: s per HPI: 79 y.o.femalewith PMHhypothyroidism,hx of craniectomy(for frontal temporary brain meningioma resection), CKD 4, rheumatoid arthritis,DMchronic anemia, chronic pain, recent COVID infection for which she was treated to [facility] and discharged few weeks ago, aortic stenosis.  Who lives at home and lately was having some diarrhea and generalized weakness, had multiple falls at home around 3-4 and thereafter became increasingly confused.  Was brought to the ER where she was found to have low-grade fever, had toxic and metabolic encephalopathy, dehydration, neurology was consulted.  She was started on empiric treatment for meningitis and admitted to the hospital.  Further work-up revealed that she simply was having seizures in response to severe colitis found on CT scan and further work-up.  She was started on Keppra mentation has improved, she is on Rocephin and Flagyl for colitis.  No diarrhea.  Clinically much better.  Patient was transferred out of Covid unit 3/17 as he was no longer needed quarantine, was beyond 21 days from initial Covid diagnosis.  Subjective:  Seen and examined this morning resting comfortably.  Appears slow to interact.  Able to tell me her name and that she is in the hospital.  Bowel movement last night.  No diarrhea today.   Assessment & Plan:  Acute toxic metabolic encephalopathy: Likely multifactorial in the setting of colitis, multiple comorbidities, recent Covid pneumonia. Patient intermittently confused but is more alert awake, able to follow some commands  and can interact.  She had extensive evaluation with neurology consultation, CT head MRI brain lumbar puncture which were all unremarkable.  EEG suspicious for epileptiform focus and is placed on Keppra.  Continue PT OT  CT evidence of severe colitis:?  Infectious versus  ischemic.  Overall normal WBC count, hemoglobin trending up without transfusion.  Platelet count stable.  No diarrhea or rectal bleeding or black stool today.  CRP down to 2.2.  Current conservative management encourage PT OT, dysphagia screen.  Patient has history of diverticulitis with perforation in the past with a status post small bowel and partial colectomy in 2012, last colonoscopy in 2013. Continue total 10 days of Flagyl (started-3/6).  Appreciate gastroenterology input.  Defer further need for CT to GI.  Recent COVID-19 pneumonia was fully treated, out of quarantine. Overall stable/still elevated inflammatory biomarkers.  Currently saturating well on room air.  Urine Retention: Foley removed 3/9, continue Flomax.  Monitor output.  History of meningioma/craniotomy in the past, currently stable.  CT head with no acute finding.  History of moderate to severe AS: No acute disease.  Outpatient follow-up to consider ?[facility]  Anemia of chronic disease: Hemoglobin stable 8.4 g.  No need for transfusion.  Patient is Je[employer]'s [employer].  Continue iron supplementation, PPI.  Off heparin/Lovenox due to downtrending hemoglobin and abdominal hematoma.  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  Monitor closely. Metabolic acidosis with bicarb at 19-20.  Monitor at this is likely from CKD.  Elevated D-dimer with history of DVT negative for DVT on duplex.  Chemical prophylaxis currently on hold due to dropping H&H since subcutaneous hematoma in the abdomen on 3/8.  Continue SCD.  Monitor.  This could be in the setting of her recent Covid pneumonia.  Fungal UTI received dose of Diflucan.  Hypomagnesemia treated.  Monitor.  Hypothyroidism: TSH and cortisol stable.  On IV Synthroid.  Diabetes mellitus, type II : Stable A1c 6.6.  Monitor.  Nutrition:  Body mass index is 32.17 kg/m.  DVT prophylaxis:SCD. Heparin stopped on 3/8 due to continued anemia and abdominal soft tissue hematoma Code Status:  Partial-no intubation Family Communication: plan of care discussed with patient at bedside.  Daughter have been updated multiple times by prior attending. Will call and update daughter.  Disposition Plan: Patient is from:HOME Anticipated Disposition: to Skilled nursing facility. Barriers to discharge or conditions that needs to be met prior to discharge: Remains in the hospital for ongoing management of colitis with IV antibiotics.  Remains encephalopathic, but improving. Continue PT OT anticipating skilled nursing facility. Family has not decided snf yet.  Consultants:see note  Procedures: CT head.  Nonacute.  EEG.  Final - This studyshowed evidence of epileptogenicity and cortical dysfunction in right frontocentral region.No seizures were seen throughout the recording.  Lower extremity venous duplex.  No DVT.  LP - 1 wbc  CT abdomen pelvis noncontrast- 1. Findings are concerning for severe colitis, as above. 2. Findings in the lungs which likely reflect evolving post infectious or inflammatory scarring given the patient's history of recent COVID-19 infection. 3. Aortic atherosclerosis, in addition to left main and 3 vessel coronary artery disease. 4. There are calcifications of the aortic valve. Echocardiographic correlation for evaluation of potential valvular dysfunction may be warranted if clinically indicated. 5. Additional incidental findings, as above.  MRI brain.  Nonacute.  Microbiology:see note  Medications: Scheduled Meds: . famotidine  40 mg Oral BID  . feeding supplement  1 Container Oral TID BM  . feeding supplement (PRO-STAT SUGAR FREE 64)  30 mL Oral TID WC  . ferrous sulfate  325 mg Oral TID WC  . insulin aspart  0-9 Units Subcutaneous TID WC  . levETIRAcetam  500 mg Oral BID  . levothyroxine  150 mcg Oral Q0600  . tamsulosin  0.4 mg Oral Daily   Continuous Infusions: . metronidazole 500 mg (03/16/19 0354)    Antimicrobials: Anti-infectives (From  admission, onward)   Start     Dose/Rate Route Frequency Ordered Stop   03/12/19 1200  metroNIDAZOLE (FLAGYL) IVPB 500 mg     500 mg 100 mL/hr over 60 Minutes Intravenous Every 8 hours 03/12/19 1048     03/12/19 1200  cefTRIAXone (ROCEPHIN) 2 g in sodium chloride 0.9 % 100 mL IVPB     2 g 200 mL/hr over 30 Minutes Intravenous Every 24 hours 03/12/19 1049 03/14/19 1241   03/12/19 1033  vancomycin variable dose per unstable renal function (pharmacist dosing)  Status:  Discontinued      Does not apply See admin instructions 03/12/19 1033 03/12/19 1048   03/11/19 2300  meropenem (MERREM) 1 g in sodium chloride 0.9 % 100 mL IVPB  Status:  Discontinued     1 g 200 mL/hr over 30 Minutes Intravenous Every 12 hours 03/11/19 2217 03/12/19 1048   03/11/19 1130  vancomycin (VANCOCIN) IVPB 1000 mg/200 mL premix  Status:  Discontinued     1,000 mg 200 mL/hr over 60 Minutes Intravenous Every 48 hours 03/11/19 1032 03/12/19 1033   03/11/19 1130  fluconazole (DIFLUCAN) IVPB 100 mg     100 mg 50 mL/hr over 60 Minutes Intravenous  Once 03/11/19 1035 03/12/19 0943   03/11/19 0600  acyclovir (ZOVIRAX) 800 mg in dextrose 5 % 150 mL IVPB  Status:  Discontinued     800 mg 166 mL/hr over 60 Minutes Intravenous Every 24 hours 03/10/19 1234 03/11/19 1715   03/10/19 1600  meropenem (MERREM) 1 g in  sodium chloride 0.9 % 100 mL IVPB  Status:  Discontinued     1 g 200 mL/hr over 30 Minutes Intravenous Every 12 hours 03/10/19 1247 03/11/19 2217   03/10/19 0032  vancomycin variable dose per unstable renal function (pharmacist dosing)  Status:  Discontinued      Does not apply See admin instructions 03/10/19 0032 03/11/19 1032   03/09/19 2330  acyclovir (ZOVIRAX) 800 mg in dextrose 5 % 150 mL IVPB  Status:  Discontinued     800 mg 166 mL/hr over 60 Minutes Intravenous Every 8 hours 03/09/19 2321 03/10/19 1234   03/09/19 2330  meropenem (MERREM) 1 g in sodium chloride 0.9 % 100 mL IVPB  Status:  Discontinued     1  g 200 mL/hr over 30 Minutes Intravenous Every 8 hours 03/09/19 2324 03/10/19 1247   03/09/19 2330  vancomycin (VANCOREADY) IVPB 1500 mg/300 mL     1,500 mg 150 mL/hr over 120 Minutes Intravenous  Once 03/09/19 2324 03/10/19 0601       Objective: Vitals: Today's Vitals   03/15/19 1710 03/15/19 2038 03/15/19 2150 03/16/19 0454  BP: (!) 136/55 (!) 153/62 (!) 142/68 (!) 149/61  Pulse: 69 81 87 66  Resp: 18 17 17 18   Temp: 98.7 F (37.1 C) 99.6 F (37.6 C) 98.7 F (37.1 C) 98 F (36.7 C)  TempSrc: Oral Oral Oral   SpO2: 96% 100% 96% 98%  Weight:      Height:      PainSc:   0-No pain     Intake/Output Summary (Last 24 hours) at 03/16/2019 0831 Last data filed at 03/16/2019 0530 Gross per 24 hour  Intake 1555.01 ml  Output 201 ml  Net 1354.01 ml   Filed Weights   03/10/19 0424 03/13/19 0321 03/14/19 2126  Weight: 80.9 kg 80.8 kg 85 kg   Weight change:    Intake/Output from previous day: 03/09 0701 - 03/10 0700 In: 1673 [P.O.:1138; IV Piggyback:535] Out: 651 [Urine:650; Stool:1] Intake/Output this shift: No intake/output data recorded.  Examination:  General exam: Ill looking, elderly, weak appearing. HEENT:Oral mucosa moist, Ear/Nose WNL grossly,dentition normal. Respiratory system: bilaterally clear, no wheezing or crackles,no use of accessory muscle, non tender. Cardiovascular system: S1 & S2 +, regular, No JVD. Gastrointestinal system: Abdomen soft, mild generalized tenderness present,ND, BS+. Nervous System:Alert, awake, moving extremities and grossly nonfocal Extremities: No edema, distal peripheral pulses palpable.  Skin: No rashes,no icterus. MSK: Normal muscle bulk,tone, power  Data Reviewed: I have personally reviewed following labs and imaging studies CBC: Recent Labs  Lab 03/12/19 0245 03/13/19 0221 03/14/19 0456 03/15/19 0448 03/16/19 0457  WBC 11.8* 12.5* 8.4 7.8 8.1  NEUTROABS 9.3* 9.5* 5.4 4.6 5.4  HGB 7.5* 7.1* 6.7* 7.1* 8.4*  HCT 23.2*  21.5* 20.7* 22.1* 26.4*  MCV 88.9 88.1 88.8 91.3 89.8  PLT 298 295 303 346 237   Basic Metabolic Panel: Recent Labs  Lab 03/12/19 0245 03/13/19 0221 03/14/19 0456 03/15/19 0448 03/16/19 0457  NA 143 144 144 142 144  K 3.7 4.1 3.7 3.9 3.8  CL 112* 114* 114* 111 114*  CO2 21* 21* 21* 19* 20*  GLUCOSE 127* 123* 106* 107* 119*  BUN 27* 27* 26* 24* 24*  CREATININE 2.94* 2.55* 2.32* 2.23* 2.14*  CALCIUM 8.2* 8.1* 8.0* 8.0* 8.4*  MG 2.0 1.9 1.7 1.6* 2.0   GFR: Estimated Creatinine Clearance: 22.1 mL/min (A) (by C-G formula based on SCr of 2.14 mg/dL (H)). Liver Function Tests: Recent Labs  Lab  03/12/19 0245 03/13/19 0221 03/14/19 0456 03/15/19 0448 03/16/19 0457  AST 13* 15 13* 18 30  ALT 11 10 9 9 12   ALKPHOS 94 92 94 110 150*  BILITOT 0.8 0.8 0.6 0.3 0.5  PROT 4.9* 4.9* 4.6* 4.6* 5.3*  ALBUMIN 1.4* 1.5* 1.4* 1.5* 1.8*   No results for input(s): LIPASE, AMYLASE in the last 168 hours. Recent Labs  Lab 03/10/19 0619  AMMONIA 83*   Coagulation Profile: Recent Labs  Lab 03/09/19 1944  INR 1.1   Cardiac Enzymes: No results for input(s): CKTOTAL, CKMB, CKMBINDEX, TROPONINI in the last 168 hours. BNP (last 3 results) No results for input(s): PROBNP in the last 8760 hours. HbA1C: No results for input(s): HGBA1C in the last 72 hours. CBG: Recent Labs  Lab 03/15/19 0722 03/15/19 1107 03/15/19 1711 03/15/19 2143 03/16/19 0718  GLUCAP 117* 160* 103* 179* 108*   Lipid Profile: No results for input(s): CHOL, HDL, LDLCALC, TRIG, CHOLHDL, LDLDIRECT in the last 72 hours. Thyroid Function Tests: No results for input(s): TSH, T4TOTAL, FREET4, T3FREE, THYROIDAB in the last 72 hours. Anemia Panel: Recent Labs    03/14/19 1009  VITAMINB12 737  FOLATE 9.7  FERRITIN 2,380*  TIBC 102*  IRON 48  RETICCTPCT 0.7   Sepsis Labs: Recent Labs  Lab 03/09/19 1944  LATICACIDVEN 1.6    Recent Results (from the past 240 hour(s))  Blood Culture (routine x 2)     Status:  None   Collection Time: 03/09/19  7:44 PM   Specimen: BLOOD  Result Value Ref Range Status   Specimen Description BLOOD RIGHT ANTECUBITAL  Final   Special Requests   Final    BOTTLES DRAWN AEROBIC AND ANAEROBIC Blood Culture results may not be optimal due to an inadequate volume of blood received in culture bottles   Culture   Final    NO GROWTH 5 DAYS Performed at [facility], [place] [address] [place], [place] [address]    Report Status 03/14/2019 FINAL  Final  Blood Culture (routine x 2)     Status: None   Collection Time: 03/09/19  7:49 PM   Specimen: BLOOD  Result Value Ref Range Status   Specimen Description BLOOD LEFT ANTECUBITAL  Final   Special Requests   Final    BOTTLES DRAWN AEROBIC AND ANAEROBIC Blood Culture results may not be optimal due to an inadequate volume of blood received in culture bottles   Culture   Final    NO GROWTH 5 DAYS Performed at [facility], [place] [address], [place], [place] [address]    Report Status 03/14/2019 FINAL  Final  Urine culture     Status: Abnormal   Collection Time: 03/09/19  8:00 PM   Specimen: In/Out Cath Urine  Result Value Ref Range Status   Specimen Description IN/OUT CATH URINE  Final   Special Requests   Final    NONE Performed at [facility], [place] [address], [place],  [address]    Culture >=100,000 COLONIES/mL YEAST (A)  Final   Report Status 03/10/2019 FINAL  Final  CSF culture     Status: None   Collection Time: 03/11/19  2:48 PM   Specimen: PATH Cytology CSF; Cerebrospinal Fluid  Result Value Ref Range Status   Specimen Description CSF  Final   Special Requests NONE  Final   Gram Stain   Final    WBC PRESENT,BOTH PMN AND MONONUCLEAR NO ORGANISMS SEEN CYTOSPIN SMEAR  Culture   Final    NO GROWTH 3 DAYS Performed at [facility], [place] [address] [place], [place] [address]    Report Status 03/14/2019 FINAL  Final  MRSA PCR Screening     Status: None   Collection Time: 03/12/19   9:29 AM   Specimen: Nasal Mucosa; Nasopharyngeal  Result Value Ref Range Status   MRSA by PCR NEGATIVE NEGATIVE Final    Comment:        The GeneXpert MRSA Assay (FDA approved for NASAL specimens only), is one component of a comprehensive MRSA colonization surveillance program. It is not intended to diagnose MRSA infection nor to guide or monitor treatment for MRSA infections. Performed at [facility], [place] [address] [place], [place] [address]       Radiology Studies: No results found.   LOS: 6 days   Time spent: More than 50% of that time was spent in counseling and/or coordination of care.  Antonieta Pert, MD [facility]  03/16/2019, 8:31 AM

## 2019-03-16 NOTE — Progress Notes (Signed)
Subjective: Patient was seen and examined at bedside. Have had a formed bowel movement yesterday. As per documentation she had incontinence of urine and stool today morning. There has been no evidence of blood in stool or black stools.  Objective: Vital signs in last 24 hours: Temp:  [98 F (36.7 C)-99.6 F (37.6 C)] 98.5 F (36.9 C) (03/10 0800) Pulse Rate:  [66-87] 69 (03/10 0800) Resp:  [14-18] 17 (03/10 0800) BP: (132-153)/(50-68) 142/50 (03/10 0800) SpO2:  [96 %-100 %] 100 % (03/10 0800) Weight change:  Last BM Date: 03/15/19  PE: Mild pallor, no icterus, awake GENERAL: Lying on bed, not in distress ABDOMEN: Large bruise in lower abdomen along with multiple tender nodular areas of possible abdominal wall hematomas, mild generalized tenderness, soft, active bowel sounds EXTREMITIES: No deformity  Lab Results: Results for orders placed or performed during the hospital encounter of 03/09/19 (from the past 48 hour(s))  Vitamin B12     Status: None   Collection Time: 03/14/19 10:09 AM  Result Value Ref Range   Vitamin B-12 737 180 - 914 pg/mL    Comment: (NOTE) This assay is not validated for testing neonatal or myeloproliferative syndrome specimens for Vitamin B12 levels. Performed at [facility], [place] [address], [place], [place] [address]   Iron and TIBC     Status: Abnormal   Collection Time: 03/14/19 10:09 AM  Result Value Ref Range   Iron 48 28 - 170 ug/dL   TIBC 102 (L) 250 - 450 ug/dL   Saturation Ratios 47 (H) 10.4 - 31.8 %   UIBC 54 ug/dL    Comment: Performed at [facility] [address], [place], [place] [address]  Ferritin     Status: Abnormal   Collection Time: 03/14/19 10:09 AM  Result Value Ref Range   Ferritin 2,380 (H) 11 - 307 ng/mL    Comment: Performed at [facility], [place] [address] [place], [place] [address]  Reticulocytes     Status: Abnormal   Collection Time: 03/14/19 10:09 AM  Result Value Ref Range   Retic Ct Pct  0.7 0.4 - 3.1 %   RBC. 2.45 (L) 3.87 - 5.11 MIL/uL   Retic Count, Absolute 16.4 (L) 19.0 - 186.0 K/uL   Immature Retic Fract 14.4 2.3 - 15.9 %    Comment: Performed at [facility] [address] [place], [place] [address]  Folate     Status: None   Collection Time: 03/14/19 10:09 AM  Result Value Ref Range   Folate 9.7 >5.9 ng/mL    Comment: Performed at [facility] [address] [place], [place] [address]  Cytology - Non PAP;     Status: None   Collection Time: 03/14/19 11:05 AM  Result Value Ref Range   CYTOLOGY - NON GYN      CYTOLOGY - NON PAP CASE: [id number] PATIENT: [name] Non-Gynecological Cytology Report     Clinical History: None provided Specimen Submitted:  A. CEREBRAL SPINAL FLUID, SPINAL TAP:   FINAL MICROSCOPIC DIAGNOSIS: - No malignant cells identified  SPECIMEN ADEQUACY: Satisfactory for evaluation  GROSS: Received is/are 2cc's of clear colorless fluid. (KJ:tc) Smears: 0 Concentration Method (ThinPrep): 1 Cell Block: 0 Additional Studies: N/A     Final Diagnosis performed by Claudette Laws, MD.   Electronically signed 03/15/2019 Technical component performed at [facility]. [facility], [place] [address] [place], [place] [address].  Professional component performed at [facility], [place] [address] [place], [place] [address].  Immunohistochemistry Technical component (if applicable) was performed at [facility]. [address], [place], [place], [place] [address].   IMMUNOHISTOCHEMISTRY DISCLAIMER (if applica ble): Some of these immunohistochemical stains may have been developed and the performance characteristics determine by [facility]. Some may not have been cleared or approved by the U.S. Food and Drug Administration. The FDA has determined that such clearance or approval is not necessary. This test is used for clinical purposes. It should not be regarded as  investigational or for research. This laboratory is certified under the [place] (CLIA-88) as qualified to perform high complexity clinical laboratory testing.  The controls stained appropriately.   Glucose, capillary     Status: Abnormal   Collection Time: 03/14/19 11:27 AM  Result Value Ref Range   Glucose-Capillary 146 (H) 70 - 99 mg/dL    Comment: Glucose reference range applies only to samples taken after fasting for at least 8 hours.  Glucose, capillary     Status: Abnormal   Collection Time: 03/14/19  4:28 PM  Result Value Ref Range   Glucose-Capillary 115 (H) 70 - 99 mg/dL    Comment: Glucose reference range applies only to samples taken after fasting for at least 8 hours.  Glucose, capillary     Status: Abnormal   Collection Time: 03/14/19  9:23 PM  Result Value Ref Range   Glucose-Capillary 154 (H) 70 - 99 mg/dL    Comment: Glucose reference range applies only to samples taken after fasting for at least 8 hours.  APTT     Status: None   Collection Time: 03/15/19  4:48 AM  Result Value Ref Range   aPTT 27 24 - 36 seconds    Comment: Performed at [facility] [address], [place], [place] [address]  Brain natriuretic peptide     Status: Abnormal   Collection Time: 03/15/19  4:48 AM  Result Value Ref Range   B Natriuretic Peptide 338.2 (H) 0.0 - 100.0 pg/mL    Comment: Performed at [facility] [address], [place], [place] [address]  C-reactive protein     Status: Abnormal   Collection Time: 03/15/19  4:48 AM  Result Value Ref Range   CRP 4.2 (H) <1.0 mg/dL    Comment: Performed at [facility] [address] [place], [place] [address]  CBC with Differential/Platelet     Status: Abnormal   Collection Time: 03/15/19  4:48 AM  Result Value Ref Range   WBC 7.8 4.0 - 10.5 K/uL   RBC 2.42 (L) 3.87 - 5.11 MIL/uL   Hemoglobin 7.1 (L) 12.0 - 15.0 g/dL   HCT 22.1 (L) 36.0 - 46.0 %   MCV 91.3 80.0 - 100.0 fL    MCH 29.3 26.0 - 34.0 pg   MCHC 32.1 30.0 - 36.0 g/dL   RDW 17.9 (H) 11.5 - 15.5 %   Platelets 346 150 - 400 K/uL   nRBC 0.6 (H) 0.0 - 0.2 %   Neutrophils Relative % 56 %   Neutro Abs 4.6 1.7 - 7.7 K/uL   Lymphocytes Relative 26 %   Lymphs Abs 2.1 0.7 - 4.0 K/uL   Monocytes Relative 5 %   Monocytes Absolute 0.4 0.1 - 1.0 K/uL   Eosinophils Relative 5 %   Eosinophils Absolute 0.4 0.0 - 0.5 K/uL   Basophils Relative 1 %   Basophils Absolute  0.0 0.0 - 0.1 K/uL   WBC Morphology MILD LEFT SHIFT (1-5% METAS, OCC MYELO, OCC BANDS)    Immature Granulocytes 7 %   Abs Immature Granulocytes 0.56 (H) 0.00 - 0.07 K/uL    Comment: Performed at [facility], [place] [address], [place], [place] [address]  Comprehensive metabolic panel     Status: Abnormal   Collection Time: 03/15/19  4:48 AM  Result Value Ref Range   Sodium 142 135 - 145 mmol/L   Potassium 3.9 3.5 - 5.1 mmol/L   Chloride 111 98 - 111 mmol/L   CO2 19 (L) 22 - 32 mmol/L   Glucose, Bld 107 (H) 70 - 99 mg/dL    Comment: Glucose reference range applies only to samples taken after fasting for at least 8 hours.   BUN 24 (H) 8 - 23 mg/dL   Creatinine, Ser 2.23 (H) 0.44 - 1.00 mg/dL   Calcium 8.0 (L) 8.9 - 10.3 mg/dL   Total Protein 4.6 (L) 6.5 - 8.1 g/dL   Albumin 1.5 (L) 3.5 - 5.0 g/dL   AST 18 15 - 41 U/L   ALT 9 0 - 44 U/L   Alkaline Phosphatase 110 38 - 126 U/L   Total Bilirubin 0.3 0.3 - 1.2 mg/dL   GFR calc non Af Amer 20 (L) >60 mL/min   GFR calc Af Amer 23 (L) >60 mL/min   Anion gap 12 5 - 15    Comment: Performed at [facility] [address] [place], [place] [address]  D-dimer, quantitative (not at [facility])     Status: Abnormal   Collection Time: 03/15/19  4:48 AM  Result Value Ref Range   D-Dimer, Quant 9.19 (H) 0.00 - 0.50 ug/mL-FEU    Comment: (NOTE) At the manufacturer cut-off of 0.50 ug/mL FEU, this assay has been documented to exclude PE with a sensitivity and negative predictive value of 97 to 99%.   At this time, this assay has not been approved by the FDA to exclude DVT/VTE. Results should be correlated with clinical presentation. Performed at [facility], [place] [address] [place] [address], [place], [place] [address]   Magnesium     Status: Abnormal   Collection Time: 03/15/19  4:48 AM  Result Value Ref Range   Magnesium 1.6 (L) 1.7 - 2.4 mg/dL    Comment: Performed at [facility] [address] [place] [address], [place], [place] [address]  Glucose, capillary     Status: Abnormal   Collection Time: 03/15/19  7:22 AM  Result Value Ref Range   Glucose-Capillary 117 (H) 70 - 99 mg/dL    Comment: Glucose reference range applies only to samples taken after fasting for at least 8 hours.  Glucose, capillary     Status: Abnormal   Collection Time: 03/15/19 11:07 AM  Result Value Ref Range   Glucose-Capillary 160 (H) 70 - 99 mg/dL    Comment: Glucose reference range applies only to samples taken after fasting for at least 8 hours.  Glucose, capillary     Status: Abnormal   Collection Time: 03/15/19  5:11 PM  Result Value Ref Range   Glucose-Capillary 103 (H) 70 - 99 mg/dL    Comment: Glucose reference range applies only to samples taken after fasting for at least 8 hours.  Glucose, capillary     Status: Abnormal   Collection Time: 03/15/19  9:43 PM  Result Value Ref Range   Glucose-Capillary 179 (H) 70 - 99 mg/dL  Comment: Glucose reference range applies only to samples taken after fasting for at least 8 hours.  Brain natriuretic peptide     Status: Abnormal   Collection Time: 03/16/19  4:57 AM  Result Value Ref Range   B Natriuretic Peptide 295.4 (H) 0.0 - 100.0 pg/mL    Comment: Performed at [facility] [address], [place], [place] [address]  C-reactive protein     Status: Abnormal   Collection Time: 03/16/19  4:57 AM  Result Value Ref Range   CRP 2.2 (H) <1.0 mg/dL    Comment: Performed at [facility] [address] [place], [place] [address]  CBC with  Differential/Platelet     Status: Abnormal   Collection Time: 03/16/19  4:57 AM  Result Value Ref Range   WBC 8.1 4.0 - 10.5 K/uL   RBC 2.94 (L) 3.87 - 5.11 MIL/uL   Hemoglobin 8.4 (L) 12.0 - 15.0 g/dL   HCT 26.4 (L) 36.0 - 46.0 %   MCV 89.8 80.0 - 100.0 fL   MCH 28.6 26.0 - 34.0 pg   MCHC 31.8 30.0 - 36.0 g/dL   RDW 18.0 (H) 11.5 - 15.5 %   Platelets 387 150 - 400 K/uL   nRBC 1.1 (H) 0.0 - 0.2 %   Neutrophils Relative % 67 %   Neutro Abs 5.4 1.7 - 7.7 K/uL   Lymphocytes Relative 24 %   Lymphs Abs 1.9 0.7 - 4.0 K/uL   Monocytes Relative 4 %   Monocytes Absolute 0.3 0.1 - 1.0 K/uL   Eosinophils Relative 4 %   Eosinophils Absolute 0.3 0.0 - 0.5 K/uL   Basophils Relative 1 %   Basophils Absolute 0.1 0.0 - 0.1 K/uL   nRBC 4 (H) 0 /100 WBC   Abs Immature Granulocytes 0.00 0.00 - 0.07 K/uL   Polychromasia PRESENT     Comment: Performed at [facility], [place] [address], [place], [place] [address]  Comprehensive metabolic panel     Status: Abnormal   Collection Time: 03/16/19  4:57 AM  Result Value Ref Range   Sodium 144 135 - 145 mmol/L   Potassium 3.8 3.5 - 5.1 mmol/L   Chloride 114 (H) 98 - 111 mmol/L   CO2 20 (L) 22 - 32 mmol/L   Glucose, Bld 119 (H) 70 - 99 mg/dL    Comment: Glucose reference range applies only to samples taken after fasting for at least 8 hours.   BUN 24 (H) 8 - 23 mg/dL   Creatinine, Ser 2.14 (H) 0.44 - 1.00 mg/dL   Calcium 8.4 (L) 8.9 - 10.3 mg/dL   Total Protein 5.3 (L) 6.5 - 8.1 g/dL   Albumin 1.8 (L) 3.5 - 5.0 g/dL   AST 30 15 - 41 U/L   ALT 12 0 - 44 U/L   Alkaline Phosphatase 150 (H) 38 - 126 U/L   Total Bilirubin 0.5 0.3 - 1.2 mg/dL   GFR calc non Af Amer 21 (L) >60 mL/min   GFR calc Af Amer 25 (L) >60 mL/min   Anion gap 10 5 - 15    Comment: Performed at [facility], [place] [address] [place], [place] [address]  D-dimer, quantitative (not at [facility])     Status: Abnormal   Collection Time: 03/16/19  4:57 AM  Result Value Ref Range    D-Dimer, Quant 13.62 (H) 0.00 - 0.50 ug/mL-FEU    Comment: (NOTE) At the manufacturer cut-off of 0.50 ug/mL  FEU, this assay has been documented to exclude PE with a sensitivity and negative predictive value of 97 to 99%.  At this time, this assay has not been approved by the FDA to exclude DVT/VTE. Results should be correlated with clinical presentation. Performed at [facility], [place] [address] [place], [place] [address]   Magnesium     Status: None   Collection Time: 03/16/19  4:57 AM  Result Value Ref Range   Magnesium 2.0 1.7 - 2.4 mg/dL    Comment: Performed at [facility] [address] [place], [place] [address]  Glucose, capillary     Status: Abnormal   Collection Time: 03/16/19  7:18 AM  Result Value Ref Range   Glucose-Capillary 108 (H) 70 - 99 mg/dL    Comment: Glucose reference range applies only to samples taken after fasting for at least 8 hours.    Studies/Results: No results found.  Medications: I have reviewed the patient's current medications.  Assessment: Colitis noted on CAT scan(? infectious, on IV antibiotic vs. ischemic)-normal WBC, hemoglobin trending up without transfusion(6.7-7.1 and 8.4 today), normal platelet No reported evidence of diarrhea/rectal bleeding/black stools CRP trending down and 2.2 today  Encephalopathy-resolved Renal impairment BUN 24/creatinine 2.1/GFR 25: Stable  Alk phos minimally elevated to 150, with a normal T bili 0.5, AST 30, ALT 12  Prior history of diverticulitis with perforation, status post small bowel and partial colectomy in 2012, last colonoscopy in 2013  Recent COVID-19 infection  Plan: Continue conservative management. Recommend physical therapy,out of bed to chair, encourage ambulation On dysphagia level 3 diet, boost 3 times a day, Protostat 30 mL 3 times a day, ferrous sulfate 325 mg 3 times a day  Currently only on IV Flagyl 500 mg every 8 hours, started on 03/12/2019, recommend to continue for  total of 10 days.   Ronnette Jun[place], MD 03/16/2019, 9:21 AM

## 2019-03-16 NOTE — Progress Notes (Signed)
Physical Therapy Treatment Patient Details Name: [name] MRN: [id number] DOB: Aug 29, 1940 Today's Date: 03/16/2019    History of Present Illness Pt is is an 79 y.o. 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 Daughter reports jerking movements of pt's legs and odd chewing motions prior to admit. No szs found on EEG. Pt had multiple falls prior to admit and found to have toxic & metabolic encephalopathy.    PT Comments    Pt asleep on entry, daughter in room stating pt probably wouldn't participate. However pt easily awoken and reluctantly agreed to therapy. Pt limited in safe mobility by decreased cognition, and joint pain in presence of decreased strength and endurance. Pt requires maxAx2 for bed mobility. Pt able to sit EoB for 15 minutes progressing from [place] for balance due to increased posterior lean to min guard and ability to participate in seated AAROM. Pt requires maxAx2 for return to bed. D/c plans remain appropriate at this time. PT will continue to follow acutely.    Follow Up Recommendations  SNF;Supervision/Assistance - 24 hour     Equipment Recommendations  None recommended by PT    Recommendations for Other Services OT consult     Precautions / Restrictions Precautions Precautions: Fall;Other (comment)(airborne) Restrictions Weight Bearing Restrictions: No    Mobility  Bed Mobility Overal bed mobility: Needs Assistance Bed Mobility: Supine to Sit     Supine to sit: Max assist;HOB elevated;Mod assist;+2 for physical assistance Sit to supine: Max assist;+2 for physical assistance   General bed mobility comments: maxAx2 for LE management of LE off bed, trunk to upright and pad scoot of hips to EoB, maxAx2 for management of LE back into  bed and squaring torso to bed  Transfers                 General transfer comment: unable to attempt today due to joint pain      Balance Overall balance assessment: Needs assistance Sitting-balance support: Feet supported;Bilateral upper extremity supported Sitting balance-Leahy Scale: Poor Sitting balance - Comments: Pt requires mod A initially to maintain upright trunk sitting EOB. She would lean posteriorly and/or to the R. However once situated, pt able to maintain balance with UE support independently. Postural control: Posterior lean                                  Cognition Arousal/Alertness: Awake/alert Behavior During Therapy: WFL for tasks assessed/performed Overall Cognitive Status: Impaired/Different from baseline Area of Impairment: Orientation;Attention;Memory;Following commands;Safety/judgement;Awareness;Problem solving                 Orientation Level: Time;Situation;Disoriented to Current Attention Level: Alternating Memory: Decreased recall of precautions;Decreased short-term memory Following Commands: Follows one step commands inconsistently Safety/Judgement: Decreased awareness of safety;Decreased awareness of deficits Awareness: Emergent Problem Solving: Slow processing;Decreased initiation;Difficulty sequencing;Requires verbal cues;Requires tactile cues General Comments: pt easily distracted requiring increased cuing      Exercises General Exercises - Lower Extremity Ankle Circles/Pumps: AAROM;Seated Long Arc Quad: AAROM;5 reps;Seated Hip ABduction/ADduction: AAROM;Both;5 reps;Seated Hip Flexion/Marching: AAROM;Both;5 reps;Seated    General Comments General comments (skin integrity, edema, etc.): VSS on RA,       Pertinent Vitals/Pain Pain Assessment: Faces Faces Pain Scale: Hurts even more(Pt doesn't  demonstrate pain however reports discomfort) Pain Location: L knee with mobility, and joints in hands with reaching   Pain Descriptors / Indicators: Aching;Constant Pain Intervention(s): Limited activity within patient's tolerance;Monitored during session;Repositioned           PT Goals (current goals can now be found in the care plan section) Acute Rehab PT Goals Patient Stated Goal: to go home PT Goal Formulation: With patient Time For Goal Achievement: 03/26/19 Potential to Achieve Goals: Fair    Frequency    Min 2X/week      PT Plan Current plan remains appropriate       AM-PAC PT "6 Clicks" Mobility   Outcome Measure  Help needed turning from your back to your side while in a flat bed without using bedrails?: Total Help needed moving from lying on your back to sitting on the side of a flat bed without using bedrails?: Total Help needed moving to and from a bed to a chair (including a wheelchair)?: A Lot Help needed standing up from a chair using your arms (e.g., wheelchair or bedside chair)?: A Lot Help needed to walk in hospital room?: Total Help needed climbing 3-5 steps with a railing? : Total 6 Click Score: 8    End of Session Equipment Utilized During Treatment: Gait belt Activity Tolerance: Patient limited by fatigue Patient left: in chair;with call bell/phone within reach;with chair alarm set Nurse Communication: Mobility status;Other (comment)(requesting pain medication) PT Visit Diagnosis: Unsteadiness on feet (R26.81);Other abnormalities of gait and mobility (R26.89);Muscle weakness (generalized) (M62.81);History of falling (Z91.81)     Time: 8937-3428 PT Time Calculation (min) (ACUTE ONLY): 22 min  Charges:  $Therapeutic Exercise: 8-22 mins                     Djuan Talton B. Migdalia Dk PT, DPT Acute Rehabilitation Services Pager [phone] Office [phone]    [place] 03/16/2019, 5:52 PM

## 2019-03-17 LAB — MAGNESIUM: Magnesium: 1.7 mg/dL (ref 1.7–2.4)

## 2019-03-17 LAB — D-DIMER, QUANTITATIVE: D-Dimer, Quant: 12.27 ug/mL-FEU — ABNORMAL HIGH (ref 0.00–0.50)

## 2019-03-17 LAB — CBC WITH DIFFERENTIAL/PLATELET
Abs Immature Granulocytes: 0.46 10*3/uL — ABNORMAL HIGH (ref 0.00–0.07)
Basophils Absolute: 0.1 10*3/uL (ref 0.0–0.1)
Basophils Relative: 1 %
Eosinophils Absolute: 0.3 10*3/uL (ref 0.0–0.5)
Eosinophils Relative: 4 %
HCT: 23.1 % — ABNORMAL LOW (ref 36.0–46.0)
Hemoglobin: 7.4 g/dL — ABNORMAL LOW (ref 12.0–15.0)
Immature Granulocytes: 6 %
Lymphocytes Relative: 24 %
Lymphs Abs: 1.9 10*3/uL (ref 0.7–4.0)
MCH: 29 pg (ref 26.0–34.0)
MCHC: 32 g/dL (ref 30.0–36.0)
MCV: 90.6 fL (ref 80.0–100.0)
Monocytes Absolute: 0.5 10*3/uL (ref 0.1–1.0)
Monocytes Relative: 6 %
Neutro Abs: 4.5 10*3/uL (ref 1.7–7.7)
Neutrophils Relative %: 59 %
Platelets: 360 10*3/uL (ref 150–400)
RBC: 2.55 MIL/uL — ABNORMAL LOW (ref 3.87–5.11)
RDW: 18.5 % — ABNORMAL HIGH (ref 11.5–15.5)
WBC: 7.7 10*3/uL (ref 4.0–10.5)
nRBC: 0.9 % — ABNORMAL HIGH (ref 0.0–0.2)

## 2019-03-17 LAB — COMPREHENSIVE METABOLIC PANEL
ALT: 12 U/L (ref 0–44)
AST: 32 U/L (ref 15–41)
Albumin: 1.6 g/dL — ABNORMAL LOW (ref 3.5–5.0)
Alkaline Phosphatase: 134 U/L — ABNORMAL HIGH (ref 38–126)
Anion gap: 10 (ref 5–15)
BUN: 28 mg/dL — ABNORMAL HIGH (ref 8–23)
CO2: 20 mmol/L — ABNORMAL LOW (ref 22–32)
Calcium: 8.1 mg/dL — ABNORMAL LOW (ref 8.9–10.3)
Chloride: 114 mmol/L — ABNORMAL HIGH (ref 98–111)
Creatinine, Ser: 2.06 mg/dL — ABNORMAL HIGH (ref 0.44–1.00)
GFR calc Af Amer: 26 mL/min — ABNORMAL LOW (ref >60)
GFR calc non Af Amer: 22 mL/min — ABNORMAL LOW (ref >60)
Glucose, Bld: 114 mg/dL — ABNORMAL HIGH (ref 70–99)
Potassium: 3.4 mmol/L — ABNORMAL LOW (ref 3.5–5.1)
Sodium: 144 mmol/L (ref 135–145)
Total Bilirubin: 0.2 mg/dL — ABNORMAL LOW (ref 0.3–1.2)
Total Protein: 4.9 g/dL — ABNORMAL LOW (ref 6.5–8.1)

## 2019-03-17 LAB — C-REACTIVE PROTEIN: CRP: 1.1 mg/dL — ABNORMAL HIGH (ref <1.0)

## 2019-03-17 LAB — GLUCOSE, CAPILLARY
Glucose-Capillary: 114 mg/dL — ABNORMAL HIGH (ref 70–99)
Glucose-Capillary: 258 mg/dL — ABNORMAL HIGH (ref 70–99)

## 2019-03-17 LAB — BRAIN NATRIURETIC PEPTIDE: B Natriuretic Peptide: 224.7 pg/mL — ABNORMAL HIGH (ref 0.0–100.0)

## 2019-03-17 MED ORDER — POTASSIUM CHLORIDE CRYS ER 20 MEQ PO TBCR
20.0000 meq | EXTENDED_RELEASE_TABLET | Freq: Once | ORAL | Status: AC
  Administered 2019-03-17: 20 meq via ORAL
  Filled 2019-03-17: qty 1

## 2019-03-17 NOTE — Plan of Care (Signed)
  Problem: Nutrition: Goal: Adequate nutrition will be maintained Outcome: Progressing   

## 2019-03-17 NOTE — Progress Notes (Signed)
Initial Nutrition Assessment  DOCUMENTATION CODES:   Obesity unspecified  INTERVENTION:  Continue Boost Breeze po TID, each supplement provides 250 kcal and 9 grams of protein.  Continue 30 ml Prostat po TID, each supplement provides 100 kcal and 15 grams of protein.   Encourage adequate PO intake.   NUTRITION DIAGNOSIS:   Inadequate oral intake related to poor appetite as evidenced by per patient/family report.  GOAL:   Patient will meet greater than or equal to 90% of their needs  MONITOR:   PO intake, Supplement acceptance, Skin, Weight trends, Labs, I & O's, Diet advancement  REASON FOR ASSESSMENT:   Malnutrition Screening Tool    ASSESSMENT:   80 y.o. 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 Presents with diarrhea, weakness  found to have low-grade fever, had toxic and metabolic encephalopathy, dehydration. Further work-up revealed that she simply was having seizures in response to severe colitis found on CT scan.  Pt is currently on a dysphagia 3 diet with thin liquids. Meal completion has been varied from 25-100%. Pt reports appetite is just "so so" Pt with no abdominal discomfort at time of visit. She reports eating well prior to admission with usual intake of 3 meals a day with an occasional nutritional supplement shake (Boost). Pt currently has Boost Breeze and Prostat ordered and has been consuming them. RD to continue with current orders to aid in caloric and protein needs. Pt encouraged to eat her food at meals and to drink her nutritional supplements.   Labs and medications reviewed.   NUTRITION - FOCUSED PHYSICAL EXAM:    Most Recent Value  Orbital Region  No depletion  Upper Arm Region  No depletion  Thoracic and Lumbar Region  No  depletion  Buccal Region  No depletion  Temple Region  Unable to assess  Clavicle Bone Region  No depletion  Clavicle and Acromion Bone Region  No depletion  Scapular Bone Region  Unable to assess  Dorsal Hand  Unable to assess  Patellar Region  No depletion  Anterior Thigh Region  No depletion  Posterior Calf Region  No depletion  Edema (RD Assessment)  Mild  Hair  Reviewed  Eyes  Reviewed  Mouth  Reviewed  Skin  Reviewed  Nails  Unable to assess       Diet Order:   Diet Order            DIET DYS 3 Room service appropriate? Yes; Fluid consistency: Thin  Diet effective now              EDUCATION NEEDS:   Not appropriate for education at this time  Skin:  Skin Assessment: Skin Integrity Issues: Skin Integrity Issues:: Other (Comment) Other: non pressure wound to buttocks  Last BM:  3/11  Height:   Ht Readings from Last 1 Encounters:  03/10/19 5\' 4"  (1.626 m)    Weight:   Wt Readings from Last 1 Encounters:  03/16/19 85 kg    Ideal Body Weight:  54.5 kg  BMI:  Body mass index is 32.17 kg/m.  Estimated Nutritional Needs:   Kcal:  1700-1900  Protein:  85-95 grams  Fluid:  >/= 1.7 L/day    Corrin Parker, MS, RD, LDN RD pager number/after hours weekend pager number on Amion.

## 2019-03-17 NOTE — Progress Notes (Signed)
   03/16/19 2015  Pain Assessment  Pain Scale 0-10  Pain Score 5  Pain Type Acute pain  Pain Location Abdomen  Pain Orientation Upper;Lower  Pain Descriptors / Indicators Aching;Tender  Pain Onset On-going  Patients Stated Pain Goal 0  Multiple Pain Sites No  Provider Notification  Provider Name/Title Tylene Fantasia  Date Provider Notified 03/16/19  Time Provider Notified 2027  Notification Type Page  Notification Reason Other (Comment) (Need PO Tylenol order)  Response See new orders  Date of Provider Response 03/16/19   Patient c/o lower abdominal pain.  Positive bowel sounds in all 4 quadrants.  Called MD to get order for PO Tylenol.  This was given at 2120.  Upon reassessment, patient is asleep and resting comfortably.  Will continue to monitor patient.  Earleen Reaper RN

## 2019-03-17 NOTE — Progress Notes (Signed)
Subjective: Patient seen and examined at bedside. As per his nurse she has been incontinent of stool and urine. There is no evidence of blood in stool and the stools are not black.  Objective: Vital signs in last 24 hours: Temp:  [98.2 F (36.8 C)-98.4 F (36.9 C)] 98.2 F (36.8 C) (03/11 0443) Pulse Rate:  [68-89] 70 (03/11 0443) Resp:  [15-18] 16 (03/11 0443) BP: (138-153)/(54-64) 138/59 (03/11 0443) SpO2:  [98 %-99 %] 99 % (03/11 0443) Weight:  [85 kg] 85 kg (03/10 2028) Weight change:  Last BM Date: 03/16/19  PE: Elderly, ill-appearing, overweight GENERAL: Prominent pallor, not in distress ABDOMEN: Abdominal wall ecchymosis and hematomas, soft, normoactive bowel sounds, generalized but mild diffuse tenderness EXTREMITIES: No deformity  Lab Results: Results for orders placed or performed during the hospital encounter of 03/09/19 (from the past 48 hour(s))  Glucose, capillary     Status: Abnormal   Collection Time: 03/15/19 11:07 AM  Result Value Ref Range   Glucose-Capillary 160 (H) 70 - 99 mg/dL    Comment: Glucose reference range applies only to samples taken after fasting for at least 8 hours.  Glucose, capillary     Status: Abnormal   Collection Time: 03/15/19  5:11 PM  Result Value Ref Range   Glucose-Capillary 103 (H) 70 - 99 mg/dL    Comment: Glucose reference range applies only to samples taken after fasting for at least 8 hours.  Glucose, capillary     Status: Abnormal   Collection Time: 03/15/19  9:43 PM  Result Value Ref Range   Glucose-Capillary 179 (H) 70 - 99 mg/dL    Comment: Glucose reference range applies only to samples taken after fasting for at least 8 hours.  Brain natriuretic peptide     Status: Abnormal   Collection Time: 03/16/19  4:57 AM  Result Value Ref Range   B Natriuretic Peptide 295.4 (H) 0.0 - 100.0 pg/mL    Comment: Performed at [facility] [address] [place], [place] [address]  C-reactive protein     Status: Abnormal   Collection Time: 03/16/19  4:57 AM  Result Value Ref Range   CRP 2.2 (H) <1.0 mg/dL    Comment: Performed at [facility] [address] [place], [place] [address]  CBC with Differential/Platelet     Status: Abnormal   Collection Time: 03/16/19  4:57 AM  Result Value Ref Range   WBC 8.1 4.0 - 10.5 K/uL   RBC 2.94 (L) 3.87 - 5.11 MIL/uL   Hemoglobin 8.4 (L) 12.0 - 15.0 g/dL   HCT 26.4 (L) 36.0 - 46.0 %   MCV 89.8 80.0 - 100.0 fL   MCH 28.6 26.0 - 34.0 pg   MCHC 31.8 30.0 - 36.0 g/dL   RDW 18.0 (H) 11.5 - 15.5 %   Platelets 387 150 - 400 K/uL   nRBC 1.1 (H) 0.0 - 0.2 %   Neutrophils Relative % 67 %   Neutro Abs 5.4 1.7 - 7.7 K/uL   Lymphocytes Relative 24 %   Lymphs Abs 1.9 0.7 - 4.0 K/uL   Monocytes Relative 4 %   Monocytes Absolute 0.3 0.1 - 1.0 K/uL   Eosinophils Relative 4 %   Eosinophils Absolute 0.3 0.0 - 0.5 K/uL   Basophils Relative 1 %   Basophils Absolute 0.1 0.0 - 0.1 K/uL   nRBC 4 (H) 0 /100 WBC   Abs Immature Granulocytes 0.00 0.00 - 0.07 K/uL   Polychromasia PRESENT  Comment: Performed at [facility], [place] [address] [place], [place] [address]  Comprehensive metabolic panel     Status: Abnormal   Collection Time: 03/16/19  4:57 AM  Result Value Ref Range   Sodium 144 135 - 145 mmol/L   Potassium 3.8 3.5 - 5.1 mmol/L   Chloride 114 (H) 98 - 111 mmol/L   CO2 20 (L) 22 - 32 mmol/L   Glucose, Bld 119 (H) 70 - 99 mg/dL    Comment: Glucose reference range applies only to samples taken after fasting for at least 8 hours.   BUN 24 (H) 8 - 23 mg/dL   Creatinine, Ser 2.14 (H) 0.44 - 1.00 mg/dL   Calcium 8.4 (L) 8.9 - 10.3 mg/dL   Total Protein 5.3 (L) 6.5 - 8.1 g/dL   Albumin 1.8 (L) 3.5 - 5.0 g/dL   AST 30 15 - 41 U/L   ALT 12 0 - 44 U/L   Alkaline Phosphatase 150 (H) 38 - 126 U/L   Total Bilirubin 0.5 0.3 - 1.2 mg/dL   GFR calc non Af Amer 21 (L) >60 mL/min   GFR calc Af Amer 25 (L) >60 mL/min   Anion gap 10 5 - 15    Comment: Performed at  [facility], [place] [address] [place], [place] [address]  D-dimer, quantitative (not at [facility])     Status: Abnormal   Collection Time: 03/16/19  4:57 AM  Result Value Ref Range   D-Dimer, Quant 13.62 (H) 0.00 - 0.50 ug/mL-FEU    Comment: (NOTE) At the manufacturer cut-off of 0.50 ug/mL FEU, this assay has been documented to exclude PE with a sensitivity and negative predictive value of 97 to 99%.  At this time, this assay has not been approved by the FDA to exclude DVT/VTE. Results should be correlated with clinical presentation. Performed at [facility], [place] [address] [place], [place] [address]   Magnesium     Status: None   Collection Time: 03/16/19  4:57 AM  Result Value Ref Range   Magnesium 2.0 1.7 - 2.4 mg/dL    Comment: Performed at [facility] [address] [place], [place] [address]  Pathologist smear review     Status: None   Collection Time: 03/16/19  4:57 AM  Result Value Ref Range   Path Review [phone]     Comment: NORMOCYTIC ANEMIA MATURATIONAL LEFT SHIFT Reviewed by Arrie Aran L. Melina Copa, M.D. Performed at [facility], [place] [address] [place], [place] [address]   Glucose, capillary     Status: Abnormal   Collection Time: 03/16/19  7:18 AM  Result Value Ref Range   Glucose-Capillary 108 (H) 70 - 99 mg/dL    Comment: Glucose reference range applies only to samples taken after fasting for at least 8 hours.  Glucose, capillary     Status: Abnormal   Collection Time: 03/16/19 11:17 AM  Result Value Ref Range   Glucose-Capillary 234 (H) 70 - 99 mg/dL    Comment: Glucose reference range applies only to samples taken after fasting for at least 8 hours.  Glucose, capillary     Status: Abnormal   Collection Time: 03/16/19  4:56 PM  Result Value Ref Range   Glucose-Capillary 110 (H) 70 - 99 mg/dL    Comment: Glucose reference range applies only to samples taken after fasting for at least 8 hours.  Glucose, capillary     Status: Abnormal    Collection Time:  03/16/19  8:29 PM  Result Value Ref Range   Glucose-Capillary 127 (H) 70 - 99 mg/dL    Comment: Glucose reference range applies only to samples taken after fasting for at least 8 hours.  Brain natriuretic peptide     Status: Abnormal   Collection Time: 03/17/19  4:08 AM  Result Value Ref Range   B Natriuretic Peptide 224.7 (H) 0.0 - 100.0 pg/mL    Comment: Performed at [facility] [address], [place], [place] [address]  C-reactive protein     Status: Abnormal   Collection Time: 03/17/19  4:08 AM  Result Value Ref Range   CRP 1.1 (H) <1.0 mg/dL    Comment: Performed at [facility] [address], [place], [place] [address]  CBC with Differential/Platelet     Status: Abnormal   Collection Time: 03/17/19  4:08 AM  Result Value Ref Range   WBC 7.7 4.0 - 10.5 K/uL   RBC 2.55 (L) 3.87 - 5.11 MIL/uL   Hemoglobin 7.4 (L) 12.0 - 15.0 g/dL   HCT 23.1 (L) 36.0 - 46.0 %   MCV 90.6 80.0 - 100.0 fL   MCH 29.0 26.0 - 34.0 pg   MCHC 32.0 30.0 - 36.0 g/dL   RDW 18.5 (H) 11.5 - 15.5 %   Platelets 360 150 - 400 K/uL   nRBC 0.9 (H) 0.0 - 0.2 %   Neutrophils Relative % 59 %   Neutro Abs 4.5 1.7 - 7.7 K/uL   Lymphocytes Relative 24 %   Lymphs Abs 1.9 0.7 - 4.0 K/uL   Monocytes Relative 6 %   Monocytes Absolute 0.5 0.1 - 1.0 K/uL   Eosinophils Relative 4 %   Eosinophils Absolute 0.3 0.0 - 0.5 K/uL   Basophils Relative 1 %   Basophils Absolute 0.1 0.0 - 0.1 K/uL   WBC Morphology MILD LEFT SHIFT (1-5% METAS, OCC MYELO, OCC BANDS)    Immature Granulocytes 6 %   Abs Immature Granulocytes 0.46 (H) 0.00 - 0.07 K/uL    Comment: Performed at [facility], [place] [address] [place], [place] [address]  Comprehensive metabolic panel     Status: Abnormal   Collection Time: 03/17/19  4:08 AM  Result Value Ref Range   Sodium 144 135 - 145 mmol/L   Potassium 3.4 (L) 3.5 - 5.1 mmol/L   Chloride 114 (H) 98 - 111 mmol/L   CO2 20 (L) 22 - 32 mmol/L   Glucose, Bld 114  (H) 70 - 99 mg/dL    Comment: Glucose reference range applies only to samples taken after fasting for at least 8 hours.   BUN 28 (H) 8 - 23 mg/dL   Creatinine, Ser 2.06 (H) 0.44 - 1.00 mg/dL   Calcium 8.1 (L) 8.9 - 10.3 mg/dL   Total Protein 4.9 (L) 6.5 - 8.1 g/dL   Albumin 1.6 (L) 3.5 - 5.0 g/dL   AST 32 15 - 41 U/L   ALT 12 0 - 44 U/L   Alkaline Phosphatase 134 (H) 38 - 126 U/L   Total Bilirubin 0.2 (L) 0.3 - 1.2 mg/dL   GFR calc non Af Amer 22 (L) >60 mL/min   GFR calc Af Amer 26 (L) >60 mL/min   Anion gap 10 5 - 15    Comment: Performed at [facility], [place] [address] [place], [place] [address]  D-dimer, quantitative (not at [facility])     Status: Abnormal   Collection Time: 03/17/19  4:08 AM  Result Value Ref Range   D-Dimer, Quant 12.27 (H) 0.00 - 0.50 ug/mL-FEU    Comment: (NOTE) At the manufacturer cut-off of 0.50 ug/mL FEU, this assay has been documented to exclude PE with a sensitivity and negative predictive value of 97 to 99%.  At this time, this assay has not been approved by the FDA to exclude DVT/VTE. Results should be correlated with clinical presentation. Performed at [facility], [place] [address] [place], [place] [address]   Magnesium     Status: None   Collection Time: 03/17/19  4:08 AM  Result Value Ref Range   Magnesium 1.7 1.7 - 2.4 mg/dL    Comment: Performed at [facility] [address], [place], [place] [address]  Glucose, capillary     Status: Abnormal   Collection Time: 03/17/19  6:58 AM  Result Value Ref Range   Glucose-Capillary 114 (H) 70 - 99 mg/dL    Comment: Glucose reference range applies only to samples taken after fasting for at least 8 hours.    Studies/Results: No results found.  Medications: I have reviewed the patient's current medications.  Assessment: Severe colitis noted on CAT scan, diarrhea on presentation, no hematochezia or melena Infectious versus ischemic CRP improving to 1.1 today  No leukocytosis,  ongoing anemia, [employer], will not accept blood transfusion even if it is life-threatening  Chronic kidney disease Toxic/metabolic encephalopathy-resolving  Malnutrition, low albumin of 1.6, low total protein of 4.9  Plan: On dysphagia level 3 diet Continue IV Flagyl Continue oral iron Plan repeating CAT scan on Friday  Ronnette Juniper, MD 03/17/2019, 8:29 AM

## 2019-03-17 NOTE — Progress Notes (Signed)
PROGRESS NOTE    [name]  XNA:[id number] DOB: 23-Oct-1940 DOA: 03/09/2019 PCP: Jonathon Jordan, MD   Brief Narrative: s per HPI: 79 y.o.femalewith PMHhypothyroidism,hx of craniectomy(for frontal temporary brain meningioma resection), CKD 4, rheumatoid arthritis,DMchronic anemia, chronic pain, recent COVID infection for which she was treated to [facility] and discharged few weeks ago, aortic stenosis.  Who lives at home and lately was having some diarrhea and generalized weakness, had multiple falls at home around 3-4 and thereafter became increasingly confused.  Was brought to the ER where she was found to have low-grade fever, had toxic and metabolic encephalopathy, dehydration, neurology was consulted.  She was started on empiric treatment for meningitis and admitted to the hospital.  Further work-up revealed that she simply was having seizures in response to severe colitis found on CT scan and further work-up.  She was started on Keppra mentation has improved, she is on Rocephin and Flagyl for colitis.  No diarrhea.  Clinically much better.  Patient was transferred out of Covid unit 3/17 as he was no longer needed quarantine, was beyond 21 days from initial Covid diagnosis.  Subjective:  Nursing reports patient is able to self feed.  Much more alert awake and oriented today. No acute events overnight. He had a large bowel movement yesterday and somewhat formed. Renal function remains the same, CRP down 1.1, hemoglobin 7.4 from 8.4 previously 7.1.  8.4 was likely spuriously high.     Assessment & Plan:  Acute toxic metabolic encephalopathy: Likely multifactorial in the setting of colitis, multiple comorbidities, recent Covid pneumonia.  Overall mental status much better.  Continue on supportive care. She had extensive evaluation with neurology consultation, CT head MRI brain lumbar puncture which were all unremarkable.  EEG suspicious for epileptiform focus and is placed on Keppra.   Continue PT OT.   CT evidence of severe colitis:?  Infectious versus ischemic.  Overall normal WBC count, hemoglobin trending up without transfusion.  Platelet count stable.  No diarrhea or rectal bleeding reported.  CRP down to 1.1. Patient has history of diverticulitis with perforation in the past with a status post small bowel and partial colectomy in 2012, last colonoscopy in 2013. Continue conservative management, encourage PT OT, dysphagia diet. Cont total 10 days of Flagyl (started-3/6).  Appreciate gastroenterology input- for CT abdomen Friday. Has ongoing abdominal tenderness mild but having bowel movement tolerating diet, no episodes of fever and no leukocytosis.Cont current plan.  Recent COVID-19 pneumonia was fully treated, out of quarantine. Overall stable/still elevated inflammatory biomarkers.  Currently saturating well on room air.  Urine Retention: Foley removed 3/9, continue Flomax.  Monitor output.  History of meningioma/craniotomy in the past, currently stable.  CT head with no acute finding.  History of moderate to severe AS: No acute disease.  Outpatient follow-up to consider ?[facility]AR  Anemia of chronic disease: Hemoglobin stable 8.4 g.  No need for transfusion.  Patient is Jehovah's Witness.  Continue iron supplementation, PPI.  Off heparin/Lovenox due to downtrending hemoglobin and abdominal hematoma.  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  Monitor closely.  Functions overall stable. Recent Labs  Lab 03/13/19 0221 03/14/19 0456 03/15/19 0448 03/16/19 0457 03/17/19 0408  BUN 27* 26* 24* 24* 28*  CREATININE 2.55* 2.32* 2.23* 5.42* 7.06*   Metabolic acidosis with bicarb at 19-20.  Monitor at this is likely from CKD.  Mild hypokalemia monitor and replete.  Elevated D-dimer with history of DVT negative for DVT on duplex.  Chemical prophylaxis currently on hold  due to dropping H&H since subcutaneous hematoma in the abdomen on 3/8.  Continue SCD.  Monitor.  This could  be in the setting of her recent Covid pneumonia.  Fungal UTI received dose of Diflucan.  Hypomagnesemia treated.  Monitor.  Hypothyroidism: TSH and cortisol stable.  On IV Synthroid.  Diabetes mellitus, type II : Stable sugar and stable A1c 6.6.  Monitor. Recent Labs  Lab 03/16/19 0718 03/16/19 1117 03/16/19 1656 03/16/19 2029 03/17/19 0658  GLUCAP 108* 234* 110* 127* 114*   Body mass index is 32.17 kg/m.  DVT prophylaxis:SCD. Heparin stopped on 3/8 due to continued anemia and abdominal soft tissue hematoma Code Status: Partial-no intubation Family Communication: plan of care discussed with patient at bedside.  Daughter have been updated multiple times by prior attending.  I called daughter [name] no few times- not available- called Butch Penny, no answer. will attempt later.  Was able to talk to [facility] later in the day. Disposition Plan: Patient is from:HOME Anticipated Disposition: HHC. Barriers to discharge or conditions that needs to be met prior to discharge: Remains in the hospital for ongoing management of colitis with IV antibiotics, supportive measures.  Encourage PT OT.  Mental status is improving but is still intermittently confused.  Family wants to take her home instead of SNF. she lives w/ daughter and husband  Consultants: GI  Procedures: CT head.  Nonacute.  EEG.  Final - This studyshowed evidence of epileptogenicity and cortical dysfunction in right frontocentral region.No seizures were seen throughout the recording.  Lower extremity venous duplex.  No DVT.  LP - 1 wbc  CT abdomen pelvis noncontrast- 1. Findings are concerning for severe colitis, as above. 2. Findings in the lungs which likely reflect evolving post infectious or inflammatory scarring given the patient's history of recent COVID-19 infection. 3. Aortic atherosclerosis, in addition to left main and 3 vessel coronary artery disease. 4. There are calcifications of the aortic valve. Echocardiographic  correlation for evaluation of potential valvular dysfunction may be warranted if clinically indicated. 5. Additional incidental findings, as above.  MRI brain.  Nonacute.  Microbiology:see note  Medications: Scheduled Meds: . famotidine  20 mg Oral Daily  . feeding supplement  1 Container Oral TID BM  . feeding supplement (PRO-STAT SUGAR FREE 64)  30 mL Oral TID WC  . ferrous sulfate  325 mg Oral TID WC  . insulin aspart  0-9 Units Subcutaneous TID WC  . levETIRAcetam  500 mg Oral BID  . levothyroxine  150 mcg Oral Q0600  . tamsulosin  0.4 mg Oral Daily   Continuous Infusions: . metronidazole 500 mg (03/17/19 0407)    Antimicrobials: Anti-infectives (From admission, onward)   Start     Dose/Rate Route Frequency Ordered Stop   03/12/19 1200  metroNIDAZOLE (FLAGYL) IVPB 500 mg     500 mg 100 mL/hr over 60 Minutes Intravenous Every 8 hours 03/12/19 1048     03/12/19 1200  cefTRIAXone (ROCEPHIN) 2 g in sodium chloride 0.9 % 100 mL IVPB     2 g 200 mL/hr over 30 Minutes Intravenous Every 24 hours 03/12/19 1049 03/14/19 1241   03/12/19 1033  vancomycin variable dose per unstable renal function (pharmacist dosing)  Status:  Discontinued      Does not apply See admin instructions 03/12/19 1033 03/12/19 1048   03/11/19 2300  meropenem (MERREM) 1 g in sodium chloride 0.9 % 100 mL IVPB  Status:  Discontinued     1 g 200 mL/hr over 30 Minutes Intravenous  Every 12 hours 03/11/19 2217 03/12/19 1048   03/11/19 1130  vancomycin (VANCOCIN) IVPB 1000 mg/200 mL premix  Status:  Discontinued     1,000 mg 200 mL/hr over 60 Minutes Intravenous Every 48 hours 03/11/19 1032 03/12/19 1033   03/11/19 1130  fluconazole (DIFLUCAN) IVPB 100 mg     100 mg 50 mL/hr over 60 Minutes Intravenous  Once 03/11/19 1035 03/12/19 0943   03/11/19 0600  acyclovir (ZOVIRAX) 800 mg in dextrose 5 % 150 mL IVPB  Status:  Discontinued     800 mg 166 mL/hr over 60 Minutes Intravenous Every 24 hours 03/10/19 1234  03/11/19 1715   03/10/19 1600  meropenem (MERREM) 1 g in sodium chloride 0.9 % 100 mL IVPB  Status:  Discontinued     1 g 200 mL/hr over 30 Minutes Intravenous Every 12 hours 03/10/19 1247 03/11/19 2217   03/10/19 0032  vancomycin variable dose per unstable renal function (pharmacist dosing)  Status:  Discontinued      Does not apply See admin instructions 03/10/19 0032 03/11/19 1032   03/09/19 2330  acyclovir (ZOVIRAX) 800 mg in dextrose 5 % 150 mL IVPB  Status:  Discontinued     800 mg 166 mL/hr over 60 Minutes Intravenous Every 8 hours 03/09/19 2321 03/10/19 1234   03/09/19 2330  meropenem (MERREM) 1 g in sodium chloride 0.9 % 100 mL IVPB  Status:  Discontinued     1 g 200 mL/hr over 30 Minutes Intravenous Every 8 hours 03/09/19 2324 03/10/19 1247   03/09/19 2330  vancomycin (VANCOREADY) IVPB 1500 mg/300 mL     1,500 mg 150 mL/hr over 120 Minutes Intravenous  Once 03/09/19 2324 03/10/19 0601       Objective: Vitals: Today's Vitals   03/16/19 2028 03/16/19 2120 03/16/19 2215 03/17/19 0443  BP: (!) 153/64   (!) 138/59  Pulse: 89   70  Resp: 15   16  Temp: 98.4 F (36.9 C)   98.2 F (36.8 C)  TempSrc:      SpO2: 99%   99%  Weight: 85 kg     Height:      PainSc:  5  Asleep     Intake/Output Summary (Last 24 hours) at 03/17/2019 0816 Last data filed at 03/17/2019 0625 Gross per 24 hour  Intake 1370 ml  Output 770 ml  Net 600 ml   Filed Weights   03/13/19 0321 03/14/19 2126 03/16/19 2028  Weight: 80.8 kg 85 kg 85 kg   Weight change:    Intake/Output from previous day: 03/10 0701 - 03/11 0700 In: 1400 [P.O.:1150; IV Piggyback:250] Out: 770 [Urine:770] Intake/Output this shift: No intake/output data recorded.  Examination:  General exam: Alert awake oriented to self, place current president and current year but not to month and day.  On room air.  Not in acute distress.   HEENT:Oral mucosa moist, Ear/Nose WNL grossly,dentition normal. Respiratory system:  bilaterally clear, no use of accessory muscle, non tender. Cardiovascular system: S1 & S2 +, regular, No JVD. Gastrointestinal system: Abdomen soft, mild generalized tenderness present, no guarding or rigidity, bowel sounds present.  Nervous System:Alert, awake, moving extremities and grossly nonfocal Extremities: No edema, distal peripheral pulses palpable.  Skin: No rashes,no icterus. MSK: Normal muscle bulk,tone, power  Data Reviewed: I have personally reviewed following labs and imaging studies CBC: Recent Labs  Lab 03/13/19 0221 03/14/19 0456 03/15/19 0448 03/16/19 0457 03/17/19 0408  WBC 12.5* 8.4 7.8 8.1 7.7  NEUTROABS 9.5* 5.4 4.6  5.4 4.5  HGB 7.1* 6.7* 7.1* 8.4* 7.4*  HCT 21.5* 20.7* 22.1* 26.4* 23.1*  MCV 88.1 88.8 91.3 89.8 90.6  PLT 295 303 346 387 834   Basic Metabolic Panel: Recent Labs  Lab 03/13/19 0221 03/14/19 0456 03/15/19 0448 03/16/19 0457 03/17/19 0408  NA 144 144 142 144 144  K 4.1 3.7 3.9 3.8 3.4*  CL 114* 114* 111 114* 114*  CO2 21* 21* 19* 20* 20*  GLUCOSE 123* 106* 107* 119* 114*  BUN 27* 26* 24* 24* 28*  CREATININE 2.55* 2.32* 2.23* 2.14* 2.06*  CALCIUM 8.1* 8.0* 8.0* 8.4* 8.1*  MG 1.9 1.7 1.6* 2.0 1.7   GFR: Estimated Creatinine Clearance: 23 mL/min (A) (by C-G formula based on SCr of 2.06 mg/dL (H)). Liver Function Tests: Recent Labs  Lab 03/13/19 0221 03/14/19 0456 03/15/19 0448 03/16/19 0457 03/17/19 0408  AST 15 13* 18 30 32  ALT _0 ALKPHOS 92 94 110 150* 134*  BILITOT 0.8 0.6 0.3 0.5 0.2*  PROT 4.9* 4.6* 4.6* 5.3* 4.9*  ALBUMIN 1.5* 1.4* 1.5* 1.8* 1.6*   No results for input(s): LIPASE, AMYLASE in the last 168 hours. No results for input(s): AMMONIA in the last 168 hours. Coagulation Profile: No results for input(s): INR, PROTIME in the last 168 hours. Cardiac Enzymes: No results for input(s): CKTOTAL, CKMB, CKMBINDEX, TROPONINI in the last 168 hours. BNP (last 3 results) No results for input(s): PROBNP in  the last 8760 hours. HbA1C: No results for input(s): HGBA1C in the last 72 hours. CBG: Recent Labs  Lab 03/16/19 0718 03/16/19 1117 03/16/19 1656 03/16/19 2029 03/17/19 0658  GLUCAP 108* 234* 110* 127* 114*   Lipid Profile: No results for input(s): CHOL, HDL, LDLCALC, TRIG, CHOLHDL, LDLDIRECT in the last 72 hours. Thyroid Function Tests: No results for input(s): TSH, T4TOTAL, FREET4, T3FREE, THYROIDAB in the last 72 hours. Anemia Panel: Recent Labs    03/14/19 1009  VITAMINB12 737  FOLATE 9.7  FERRITIN 2,380*  TIBC 102*  IRON 48  RETICCTPCT 0.7   Sepsis Labs: No results for input(s): PROCALCITON, LATICACIDVEN in the last 168 hours.  Recent Results (from the past 240 hour(s))  Blood Culture (routine x 2)     Status: None   Collection Time: 03/09/19  7:44 PM   Specimen: BLOOD  Result Value Ref Range Status   Specimen Description BLOOD RIGHT ANTECUBITAL  Final   Special Requests   Final    BOTTLES DRAWN AEROBIC AND ANAEROBIC Blood Culture results may not be optimal due to an inadequate volume of blood received in culture bottles   Culture   Final    NO GROWTH 5 DAYS Performed at [facility], [place] [address] [place], [place] [address]    Report Status 03/14/2019 FINAL  Final  Blood Culture (routine x 2)     Status: None   Collection Time: 03/09/19  7:49 PM   Specimen: BLOOD  Result Value Ref Range Status   Specimen Description BLOOD LEFT ANTECUBITAL  Final   Special Requests   Final    BOTTLES DRAWN AEROBIC AND ANAEROBIC Blood Culture results may not be optimal due to an inadequate volume of blood received in culture bottles   Culture   Final    NO GROWTH 5 DAYS Performed at [facility], [place] [address], [place], [place] [address]    Report Status 03/14/2019 FINAL  Final  Urine culture     Status: Abnormal  Collection Time: 03/09/19  8:00 PM   Specimen: In/Out Cath Urine  Result Value Ref Range Status   Specimen Description IN/OUT CATH URINE   Final   Special Requests   Final    NONE Performed at [facility], [address] [place], [place] [address]    Culture >=100,000 COLONIES/mL YEAST (A)  Final   Report Status 03/10/2019 FINAL  Final  CSF culture     Status: None   Collection Time: 03/11/19  2:48 PM   Specimen: PATH Cytology CSF; Cerebrospinal Fluid  Result Value Ref Range Status   Specimen Description CSF  Final   Special Requests NONE  Final   Gram Stain   Final    WBC PRESENT,BOTH PMN AND MONONUCLEAR NO ORGANISMS SEEN CYTOSPIN SMEAR    Culture   Final    NO GROWTH 3 DAYS Performed at [facility], [place] [address] [place], [place] [address]    Report Status 03/14/2019 FINAL  Final  MRSA PCR Screening     Status: None   Collection Time: 03/12/19  9:29 AM   Specimen: Nasal Mucosa; Nasopharyngeal  Result Value Ref Range Status   MRSA by PCR NEGATIVE NEGATIVE Final    Comment:        The GeneXpert MRSA Assay (FDA approved for NASAL specimens only), is one component of a comprehensive MRSA colonization surveillance program. It is not intended to diagnose MRSA infection nor to guide or monitor treatment for MRSA infections. Performed at [facility], [place] [address], [place], [place] [address]       Radiology Studies: No results found.   LOS: 7 days   Time spent: More than 50% of that time was spent in counseling and/or coordination of care.  Antonieta Pert, MD [facility]  03/17/2019, 8:16 AM

## 2019-03-17 NOTE — TOC Progression Note (Signed)
Transition of Care [facility]) - Progression Note    Patient Details  Name: AMARE KONTOS MRN: [id number] Date of Birth: 1941/01/05  Transition of Care [facility]) CM/SW Contact  Sharlet Salina Mila Homer, LCSW Phone Number: 03/17/2019, 1:09 PM  Clinical Narrative:  CSW talked with patient's daughter Floy Sabina [phone]) regarding Ms. Lacko's d/c plan. Per daughter, the family wants to her discharge home, as they cannot visit with her in a facility. Ms. Rico Junker explained that someone is always at home, her dad, herself and her sister is available when needed.  When asked,  Ms. Rico Junker responded that they have used [facility] before [facility]. She requested a walker with a seat.  CSW consulted with RN case manager and contact was made with PT and they do not recommend a walker with a seat for safety reasons. Daughter contacted and advised.     Expected Discharge Plan: (SNF vs. HH) Barriers to Discharge: Continued Medical Work up, Orthoptist and Services Expected Discharge Plan: (SNF vs. HH) In-house Referral: Clinical Social Work   Post Acute Care Choice: (SNF vs. HH) Living arrangements for the past 2 months: Single Family Home                                       Social Determinants of Health (SDOH) Interventions    Readmission Risk Interventions No flowsheet data found.

## 2019-03-18 ENCOUNTER — Inpatient Hospital Stay (HOSPITAL_COMMUNITY): Payer: Medicare Other

## 2019-03-18 LAB — BASIC METABOLIC PANEL
Anion gap: 10 (ref 5–15)
BUN: 27 mg/dL — ABNORMAL HIGH (ref 8–23)
CO2: 17 mmol/L — ABNORMAL LOW (ref 22–32)
Calcium: 8.3 mg/dL — ABNORMAL LOW (ref 8.9–10.3)
Chloride: 117 mmol/L — ABNORMAL HIGH (ref 98–111)
Creatinine, Ser: 1.91 mg/dL — ABNORMAL HIGH (ref 0.44–1.00)
GFR calc Af Amer: 28 mL/min — ABNORMAL LOW (ref >60)
GFR calc non Af Amer: 24 mL/min — ABNORMAL LOW (ref >60)
Glucose, Bld: 139 mg/dL — ABNORMAL HIGH (ref 70–99)
Potassium: 3.7 mmol/L (ref 3.5–5.1)
Sodium: 144 mmol/L (ref 135–145)

## 2019-03-18 LAB — GLUCOSE, CAPILLARY
Glucose-Capillary: 94 mg/dL (ref 70–99)
Glucose-Capillary: 183 mg/dL — ABNORMAL HIGH (ref 70–99)
Glucose-Capillary: 129 mg/dL — ABNORMAL HIGH (ref 70–99)
Glucose-Capillary: 149 mg/dL — ABNORMAL HIGH (ref 70–99)
Glucose-Capillary: 162 mg/dL — ABNORMAL HIGH (ref 70–99)
Glucose-Capillary: 140 mg/dL — ABNORMAL HIGH (ref 70–99)

## 2019-03-18 LAB — CBC
HCT: 23.4 % — ABNORMAL LOW (ref 36.0–46.0)
Hemoglobin: 7.5 g/dL — ABNORMAL LOW (ref 12.0–15.0)
MCH: 29.2 pg (ref 26.0–34.0)
MCHC: 32.1 g/dL (ref 30.0–36.0)
MCV: 91.1 fL (ref 80.0–100.0)
Platelets: 406 10*3/uL — ABNORMAL HIGH (ref 150–400)
RBC: 2.57 MIL/uL — ABNORMAL LOW (ref 3.87–5.11)
RDW: 18.8 % — ABNORMAL HIGH (ref 11.5–15.5)
WBC: 10.1 10*3/uL (ref 4.0–10.5)
nRBC: 0.4 % — ABNORMAL HIGH (ref 0.0–0.2)

## 2019-03-18 NOTE — Progress Notes (Signed)
Subjective: Patient seen and examined at bedside. She complains of abdominal pain. Per her nurse she had 2 bowel movements yesterday, dark green in color, no evidence of blood in stool or black stool, is mostly incontinent, stools are mushy not loose or watery. Complains of a reduced appetite.  Objective: Vital signs in last 24 hours: Temp:  [97.5 F (36.4 C)-99.1 F (37.3 C)] 98.6 F (37 C) (03/12 0501) Pulse Rate:  [73-83] 82 (03/12 0501) Resp:  [16-18] 16 (03/12 0501) BP: (128-160)/(53-67) 160/67 (03/12 0501) SpO2:  [99 %-100 %] 100 % (03/12 0501) Weight:  [80.7 kg] 80.7 kg (03/11 2118) Weight change: -4.265 kg Last BM Date: 03/17/19  PE: Chronically ill-appearing, prominent pallor GENERAL: Lying on bed ABDOMEN: Mild diffuse abdominal tenderness, soft with normoactive bowel sounds EXTREMITIES: No deformity  Lab Results: Results for orders placed or performed during the hospital encounter of 03/09/19 (from the past 48 hour(s))  Glucose, capillary     Status: Abnormal   Collection Time: 03/16/19 11:17 AM  Result Value Ref Range   Glucose-Capillary 234 (H) 70 - 99 mg/dL    Comment: Glucose reference range applies only to samples taken after fasting for at least 8 hours.  Glucose, capillary     Status: Abnormal   Collection Time: 03/16/19  4:56 PM  Result Value Ref Range   Glucose-Capillary 110 (H) 70 - 99 mg/dL    Comment: Glucose reference range applies only to samples taken after fasting for at least 8 hours.  Glucose, capillary     Status: Abnormal   Collection Time: 03/16/19  8:29 PM  Result Value Ref Range   Glucose-Capillary 127 (H) 70 - 99 mg/dL    Comment: Glucose reference range applies only to samples taken after fasting for at least 8 hours.  Brain natriuretic peptide     Status: Abnormal   Collection Time: 03/17/19  4:08 AM  Result Value Ref Range   B Natriuretic Peptide 224.7 (H) 0.0 - 100.0 pg/mL    Comment: Performed at [facility] [address] [place], [place] [address]  C-reactive protein     Status: Abnormal   Collection Time: 03/17/19  4:08 AM  Result Value Ref Range   CRP 1.1 (H) <1.0 mg/dL    Comment: Performed at [facility] [address], [place], [place] [address]  CBC with Differential/Platelet     Status: Abnormal   Collection Time: 03/17/19  4:08 AM  Result Value Ref Range   WBC 7.7 4.0 - 10.5 K/uL   RBC 2.55 (L) 3.87 - 5.11 MIL/uL   Hemoglobin 7.4 (L) 12.0 - 15.0 g/dL   HCT 23.1 (L) 36.0 - 46.0 %   MCV 90.6 80.0 - 100.0 fL   MCH 29.0 26.0 - 34.0 pg   MCHC 32.0 30.0 - 36.0 g/dL   RDW 18.5 (H) 11.5 - 15.5 %   Platelets 360 150 - 400 K/uL   nRBC 0.9 (H) 0.0 - 0.2 %   Neutrophils Relative % 59 %   Neutro Abs 4.5 1.7 - 7.7 K/uL   Lymphocytes Relative 24 %   Lymphs Abs 1.9 0.7 - 4.0 K/uL   Monocytes Relative 6 %   Monocytes Absolute 0.5 0.1 - 1.0 K/uL   Eosinophils Relative 4 %   Eosinophils Absolute 0.3 0.0 - 0.5 K/uL   Basophils Relative 1 %   Basophils Absolute 0.1 0.0 - 0.1 K/uL   WBC Morphology MILD LEFT SHIFT (1-5% METAS, OCC MYELO,  OCC BANDS)    Immature Granulocytes 6 %   Abs Immature Granulocytes 0.46 (H) 0.00 - 0.07 K/uL    Comment: Performed at [facility], [place] [address] [place], [place] [address]  Comprehensive metabolic panel     Status: Abnormal   Collection Time: 03/17/19  4:08 AM  Result Value Ref Range   Sodium 144 135 - 145 mmol/L   Potassium 3.4 (L) 3.5 - 5.1 mmol/L   Chloride 114 (H) 98 - 111 mmol/L   CO2 20 (L) 22 - 32 mmol/L   Glucose, Bld 114 (H) 70 - 99 mg/dL    Comment: Glucose reference range applies only to samples taken after fasting for at least 8 hours.   BUN 28 (H) 8 - 23 mg/dL   Creatinine, Ser 2.06 (H) 0.44 - 1.00 mg/dL   Calcium 8.1 (L) 8.9 - 10.3 mg/dL   Total Protein 4.9 (L) 6.5 - 8.1 g/dL   Albumin 1.6 (L) 3.5 - 5.0 g/dL   AST 32 15 - 41 U/L   ALT 12 0 - 44 U/L   Alkaline Phosphatase 134 (H) 38 - 126 U/L   Total Bilirubin 0.2 (L) 0.3 - 1.2 mg/dL    GFR calc non Af Amer 22 (L) >60 mL/min   GFR calc Af Amer 26 (L) >60 mL/min   Anion gap 10 5 - 15    Comment: Performed at [facility], [place] [address] [place], [place] [address]  D-dimer, quantitative (not at [facility])     Status: Abnormal   Collection Time: 03/17/19  4:08 AM  Result Value Ref Range   D-Dimer, Quant 12.27 (H) 0.00 - 0.50 ug/mL-FEU    Comment: (NOTE) At the manufacturer cut-off of 0.50 ug/mL FEU, this assay has been documented to exclude PE with a sensitivity and negative predictive value of 97 to 99%.  At this time, this assay has not been approved by the FDA to exclude DVT/VTE. Results should be correlated with clinical presentation. Performed at [facility], [place] [address], [place], [place] [address]   Magnesium     Status: None   Collection Time: 03/17/19  4:08 AM  Result Value Ref Range   Magnesium 1.7 1.7 - 2.4 mg/dL    Comment: Performed at [facility] [address] [place], [place] [address]  Glucose, capillary     Status: Abnormal   Collection Time: 03/17/19  6:58 AM  Result Value Ref Range   Glucose-Capillary 114 (H) 70 - 99 mg/dL    Comment: Glucose reference range applies only to samples taken after fasting for at least 8 hours.  Glucose, capillary     Status: Abnormal   Collection Time: 03/17/19 11:14 AM  Result Value Ref Range   Glucose-Capillary 258 (H) 70 - 99 mg/dL    Comment: Glucose reference range applies only to samples taken after fasting for at least 8 hours.  Glucose, capillary     Status: Abnormal   Collection Time: 03/18/19  7:05 AM  Result Value Ref Range   Glucose-Capillary 129 (H) 70 - 99 mg/dL    Comment: Glucose reference range applies only to samples taken after fasting for at least 8 hours.    Studies/Results: No results found.  Medications: I have reviewed the patient's current medications.  Assessment: Severe colitis noted on CAT scan from 03/12/2019 from transverse to sigmoid Unclear if it is  ischemic versus infectious Has been on antibiotics without improvement in abdominal pain,  on IV Flagyl since 03/12/2019 No leukocytosis Hemoglobin fairly stable, Jehovah's Witness, refuses blood transfusion even if it is life-threatening  Chronic kidney disease BUN 28/creatinine 5.59/RCB 22 Toxic/metabolic encephalopathy-resolving  Recent Covid pneumonia  Plan: CT of the abdomen and pelvis without contrast today  Ronnette Juniper, MD 03/18/2019, 8:20 AM

## 2019-03-18 NOTE — Plan of Care (Signed)
  Problem: Pain Managment: Goal: General experience of comfort will improve Outcome: Progressing   Problem: Safety: Goal: Ability to remain free from injury will improve Outcome: Progressing   

## 2019-03-18 NOTE — Plan of Care (Signed)
?  Problem: Clinical Measurements: ?Goal: Ability to maintain clinical measurements within normal limits will improve ?Outcome: Not Progressing ?  ?

## 2019-03-18 NOTE — Progress Notes (Signed)
PROGRESS NOTE    URIEL HORKEY  CXK:[id number] DOB: 02-28-1940 DOA: 03/09/2019 PCP: Jonathon Jordan, MD   Brief Narrative: s per HPI: 79 y.o.femalewith PMHhypothyroidism,hx of craniectomy(for frontal temporary brain meningioma resection), CKD 4, rheumatoid arthritis,DMchronic anemia, chronic pain, recent COVID infection for which she was treated to [facility] and discharged few weeks ago, aortic stenosis.  Who lives at home and lately was having some diarrhea and generalized weakness, had multiple falls at home around 3-4 and thereafter became increasingly confused.  Was brought to the ER where she was found to have low-grade fever, had toxic and metabolic encephalopathy, dehydration, neurology was consulted.  She was started on empiric treatment for meningitis and admitted to the hospital.  Further work-up revealed that she simply was having seizures in response to severe colitis found on CT scan and further work-up.  She was started on Keppra mentation has improved, she is on Rocephin and Flagyl for colitis.  No diarrhea.  Clinically much better.  Patient was transferred out of Covid unit 3/17 as he was no longer needed quarantine, was beyond 21 days from initial Covid diagnosis.  Subjective: She is alert awake appears pleasant.  Mildly confused but significantly better. Still complains of abdominal pain, does have hematoma in the lower abdomen. Nursing reports 2 bowel movement yesterday Mushy. She has been afebrile, creatinine remains a stable, hemoglobin 7.5 g same.   Assessment & Plan:  Acute toxic metabolic encephalopathy: Likely multifactorial in the setting of colitis, multiple comorbidities, recent Covid pneumonia.  Mental status appears to be improving.  Continue on supportive care. She had extensive evaluation with neurology consultation, CT head MRI brain lumbar puncture which were all unremarkable.  EEG suspicious for epileptiform focus and is placed on Keppra.  Continue PT OT.     CT evidence of severe colitis:?  Infectious versus ischemic.  Having mostly bowel movement, WBC stable afebrile.  H&H holding stable in mid 7 g range.CRP down to 1.1. Patient has history of diverticulitis with perforation in the past with a status post small bowel and partial colectomy in 2012, last colonoscopy in 2013.  Currently on IV Flagyl- cont total 10 days of Flagyl (started-3/6).  Appreciate gastroenterology input- for CT abdomen with contrast plan today given ongoing abdominal pain.    Recent COVID-19 pneumonia was fully treated, out of quarantine. Overall stable/still elevated inflammatory biomarkers.  Currently saturating well on room air.  Urine Retention: Foley removed 3/9, continue Flomax.  Monitor output.  History of meningioma/craniotomy in the past, currently stable.  CT head with no acute finding.  History of moderate to severe AS: No acute disease.  Outpatient follow-up to consider ?[facility]  Anemia of chronic disease: Hemoglobin stable 7.5 g.  No need for transfusion.  Patient is Jehovah's Witness.  Continue iron supplementation, PPI.  Off heparin/Lovenox due to downtrending hemoglobin and abdominal hematoma.  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  Monitor closely.  Functions overall stable. Recent Labs  Lab 03/14/19 0456 03/15/19 0448 03/16/19 0457 03/17/19 0408 03/18/19 0902  BUN 26* 24* 24* 28* 27*  CREATININE 2.32* 2.23* 2.14* 9.70* 2.63*   Metabolic acidosis with bicarb at 1720.  Monitor at this is likely from CKD.  Mild hypokalemia -improved  Elevated D-dimer with history of DVT negative for DVT on duplex.  Chemical prophylaxis currently on hold due to dropping H&H since subcutaneous hematoma in the abdomen on 3/8.  Continue SCD.  Monitor.  This could be in the setting of her recent Covid pneumonia.  Fungal UTI received dose of Diflucan.  Hypomagnesemia treated.  Monitor.  Hypothyroidism: TSH and cortisol stable.  On IV Synthroid.  Diabetes mellitus,  type II : Stable sugar and stable A1c 6.6.  Monitor. Recent Labs  Lab 03/17/19 1114 03/17/19 1643 03/17/19 2119 03/18/19 0705 03/18/19 1128  GLUCAP 258* 94 183* 129* 149*   Body mass index is 30.55 kg/m.  DVT prophylaxis:SCD. Heparin stopped on 3/8 due to continued anemia and abdominal soft tissue hematoma Code Status: Partial-no intubation Family Communication: plan of care discussed with patient at bedside.  Daughter have been updated multiple times by prior attending.  I called daughter [name] no few times- not available- called [facility], no answer. will I was able to get her left [name] and updated in evening 3/11-they plan to take her back home once improves. Disposition Plan: Patient is from:HOME Anticipated Disposition: HHC. Barriers to discharge or conditions that needs to be met prior to discharge: Remains in the hospital for ongoing management of colitis with IV antibiotics, supportive measures, planning for repeat CT abdomen due to ongoing abdominal pain.Encourage PT OT. [facility] wants to take her home instead of SNF. she lives w/ daughter and husband  Consultants: GI  Procedures: CT head.  Nonacute.  EEG.  Final - This studyshowed evidence of epileptogenicity and cortical dysfunction in right frontocentral region.No seizures were seen throughout the recording.  Lower extremity venous duplex.  No DVT.  LP - 1 wbc  CT abdomen pelvis noncontrast- 1. Findings are concerning for severe colitis, as above. 2. Findings in the lungs which likely reflect evolving post infectious or inflammatory scarring given the patient's history of recent COVID-19 infection. 3. Aortic atherosclerosis, in addition to left main and 3 vessel coronary artery disease. 4. There are calcifications of the aortic valve. Echocardiographic correlation for evaluation of potential valvular dysfunction may be warranted if clinically indicated. 5. Additional incidental findings, as above.  MRI brain.   Nonacute.  Microbiology:see note  Medications: Scheduled Meds: . famotidine  20 mg Oral Daily  . feeding supplement  1 Container Oral TID BM  . feeding supplement (PRO-STAT SUGAR FREE 64)  30 mL Oral TID WC  . ferrous sulfate  325 mg Oral TID WC  . insulin aspart  0-9 Units Subcutaneous TID WC  . levETIRAcetam  500 mg Oral BID  . levothyroxine  150 mcg Oral Q0600  . tamsulosin  0.4 mg Oral Daily   Continuous Infusions: . metronidazole 500 mg (03/18/19 1138)    Antimicrobials: Anti-infectives (From admission, onward)   Start     Dose/Rate Route Frequency Ordered Stop   03/12/19 1200  metroNIDAZOLE (FLAGYL) IVPB 500 mg     500 mg 100 mL/hr over 60 Minutes Intravenous Every 8 hours 03/12/19 1048     03/12/19 1200  cefTRIAXone (ROCEPHIN) 2 g in sodium chloride 0.9 % 100 mL IVPB     2 g 200 mL/hr over 30 Minutes Intravenous Every 24 hours 03/12/19 1049 03/14/19 1241   03/12/19 1033  vancomycin variable dose per unstable renal function (pharmacist dosing)  Status:  Discontinued      Does not apply See admin instructions 03/12/19 1033 03/12/19 1048   03/11/19 2300  meropenem (MERREM) 1 g in sodium chloride 0.9 % 100 mL IVPB  Status:  Discontinued     1 g 200 mL/hr over 30 Minutes Intravenous Every 12 hours 03/11/19 2217 03/12/19 1048   03/11/19 1130  vancomycin (VANCOCIN) IVPB 1000 mg/200 mL premix  Status:  Discontinued  1,000 mg 200 mL/hr over 60 Minutes Intravenous Every 48 hours 03/11/19 1032 03/12/19 1033   03/11/19 1130  fluconazole (DIFLUCAN) IVPB 100 mg     100 mg 50 mL/hr over 60 Minutes Intravenous  Once 03/11/19 1035 03/12/19 0943   03/11/19 0600  acyclovir (ZOVIRAX) 800 mg in dextrose 5 % 150 mL IVPB  Status:  Discontinued     800 mg 166 mL/hr over 60 Minutes Intravenous Every 24 hours 03/10/19 1234 03/11/19 1715   03/10/19 1600  meropenem (MERREM) 1 g in sodium chloride 0.9 % 100 mL IVPB  Status:  Discontinued     1 g 200 mL/hr over 30 Minutes Intravenous Every  12 hours 03/10/19 1247 03/11/19 2217   03/10/19 0032  vancomycin variable dose per unstable renal function (pharmacist dosing)  Status:  Discontinued      Does not apply See admin instructions 03/10/19 0032 03/11/19 1032   03/09/19 2330  acyclovir (ZOVIRAX) 800 mg in dextrose 5 % 150 mL IVPB  Status:  Discontinued     800 mg 166 mL/hr over 60 Minutes Intravenous Every 8 hours 03/09/19 2321 03/10/19 1234   03/09/19 2330  meropenem (MERREM) 1 g in sodium chloride 0.9 % 100 mL IVPB  Status:  Discontinued     1 g 200 mL/hr over 30 Minutes Intravenous Every 8 hours 03/09/19 2324 03/10/19 1247   03/09/19 2330  vancomycin (VANCOREADY) IVPB 1500 mg/300 mL     1,500 mg 150 mL/hr over 120 Minutes Intravenous  Once 03/09/19 2324 03/10/19 0601       Objective: Vitals: Today's Vitals   03/18/19 0501 03/18/19 0800 03/18/19 0955 03/18/19 0956  BP: (!) 160/67  (!) 148/67   Pulse: 82  71   Resp: 16  18   Temp: 98.6 F (37 C)  97.6 F (36.4 C)   TempSrc: Oral  Oral   SpO2: 100%  96%   Weight:      Height:      PainSc:  5   5     Intake/Output Summary (Last 24 hours) at 03/18/2019 1145 Last data filed at 03/18/2019 0830 Gross per 24 hour  Intake 1163 ml  Output 600 ml  Net 563 ml   Filed Weights   03/14/19 2126 03/16/19 2028 03/17/19 2118  Weight: 85 kg 85 kg 80.7 kg   Weight change: -4.265 kg   Intake/Output from previous day: 03/11 0701 - 03/12 0700 In: 1416 [P.O.:1080; I.V.:33; IV Piggyback:300] Out: 600 [Urine:600] Intake/Output this shift: Total I/O In: 120 [P.O.:120] Out: -   Examination:  General exam: Alert awake frail, oriented to current year but not to month and day.  On room air.  Not in acute distress.   HEENT:Oral mucosa moist, Ear/Nose WNL grossly,dentition normal. Respiratory system: bilaterally clear, no use of accessory muscle, non tender. Cardiovascular system: S1 & S2 +, regular, No JVD. Gastrointestinal system: Abdomen soft mild, generalized abdominal  tenderness present, ecchymosis hematoma present on the lower abdomen Nervous System:Alert, awake, moving extremities and grossly nonfocal Extremities: No edema, distal peripheral pulses palpable.  Skin: No rashes,no icterus. MSK: Normal muscle bulk,tone, power  Data Reviewed: I have personally reviewed following labs and imaging studies CBC: Recent Labs  Lab 03/13/19 0221 03/13/19 0221 03/14/19 0456 03/15/19 0448 03/16/19 0457 03/17/19 0408 03/18/19 0902  WBC 12.5*   < > 8.4 7.8 8.1 7.7 10.1  NEUTROABS 9.5*  --  5.4 4.6 5.4 4.5  --   HGB 7.1*   < > 6.7*  7.1* 8.4* 7.4* 7.5*  HCT 21.5*   < > 20.7* 22.1* 26.4* 23.1* 23.4*  MCV 88.1   < > 88.8 91.3 89.8 90.6 91.1  PLT 295   < > 303 346 387 360 406*   < > = values in this interval not displayed.   Basic Metabolic Panel: Recent Labs  Lab 03/13/19 0221 03/13/19 0221 03/14/19 0456 03/15/19 0448 03/16/19 0457 03/17/19 0408 03/18/19 0902  NA 144   < > 144 142 144 144 144  K 4.1   < > 3.7 3.9 3.8 3.4* 3.7  CL 114*   < > 114* 111 114* 114* 117*  CO2 21*   < > 21* 19* 20* 20* 17*  GLUCOSE 123*   < > 106* 107* 119* 114* 139*  BUN 27*   < > 26* 24* 24* 28* 27*  CREATININE 2.55*   < > 2.32* 2.23* 2.14* 2.06* 1.91*  CALCIUM 8.1*   < > 8.0* 8.0* 8.4* 8.1* 8.3*  MG 1.9  --  1.7 1.6* 2.0 1.7  --    < > = values in this interval not displayed.   GFR: Estimated Creatinine Clearance: 24.1 mL/min (A) (by C-G formula based on SCr of 1.91 mg/dL (H)). Liver Function Tests: Recent Labs  Lab 03/13/19 0221 03/14/19 0456 03/15/19 0448 03/16/19 0457 03/17/19 0408  AST 15 13* 18 30 32  ALT 10 9 9 12 12   ALKPHOS 92 94 110 150* 134*  BILITOT 0.8 0.6 0.3 0.5 0.2*  PROT 4.9* 4.6* 4.6* 5.3* 4.9*  ALBUMIN 1.5* 1.4* 1.5* 1.8* 1.6*   No results for input(s): LIPASE, AMYLASE in the last 168 hours. No results for input(s): AMMONIA in the last 168 hours. Coagulation Profile: No results for input(s): INR, PROTIME in the last 168 hours. Cardiac  Enzymes: No results for input(s): CKTOTAL, CKMB, CKMBINDEX, TROPONINI in the last 168 hours. BNP (last 3 results) No results for input(s): PROBNP in the last 8760 hours. HbA1C: No results for input(s): HGBA1C in the last 72 hours. CBG: Recent Labs  Lab 03/17/19 1114 03/17/19 1643 03/17/19 2119 03/18/19 0705 03/18/19 1128  GLUCAP 258* 94 183* 129* 149*   Lipid Profile: No results for input(s): CHOL, HDL, LDLCALC, TRIG, CHOLHDL, LDLDIRECT in the last 72 hours. Thyroid Function Tests: No results for input(s): TSH, T4TOTAL, FREET4, T3FREE, THYROIDAB in the last 72 hours. Anemia Panel: No results for input(s): VITAMINB12, FOLATE, FERRITIN, TIBC, IRON, RETICCTPCT in the last 72 hours. Sepsis Labs: No results for input(s): PROCALCITON, LATICACIDVEN in the last 168 hours.  Recent Results (from the past 240 hour(s))  Blood Culture (routine x 2)     Status: None   Collection Time: 03/09/19  7:44 PM   Specimen: BLOOD  Result Value Ref Range Status   Specimen Description BLOOD RIGHT ANTECUBITAL  Final   Special Requests   Final    BOTTLES DRAWN AEROBIC AND ANAEROBIC Blood Culture results may not be optimal due to an inadequate volume of blood received in culture bottles   Culture   Final    NO GROWTH 5 DAYS Performed at [facility], [place] [address] [place], [place] [address]    Report Status 03/14/2019 FINAL  Final  Blood Culture (routine x 2)     Status: None   Collection Time: 03/09/19  7:49 PM   Specimen: BLOOD  Result Value Ref Range Status   Specimen Description BLOOD LEFT ANTECUBITAL  Final   Special Requests   Final  BOTTLES DRAWN AEROBIC AND ANAEROBIC Blood Culture results may not be optimal due to an inadequate volume of blood received in culture bottles   Culture   Final    NO GROWTH 5 DAYS Performed at [facility], [place] [address], [place], [place] [address]    Report Status 03/14/2019 FINAL  Final  Urine culture     Status: Abnormal   Collection Time:  03/09/19  8:00 PM   Specimen: In/Out Cath Urine  Result Value Ref Range Status   Specimen Description IN/OUT CATH URINE  Final   Special Requests   Final    NONE Performed at [facility], [place] [address] [place], [place] [address]    Culture >=100,000 COLONIES/mL YEAST (A)  Final   Report Status 03/10/2019 FINAL  Final  CSF culture     Status: None   Collection Time: 03/11/19  2:48 PM   Specimen: PATH Cytology CSF; Cerebrospinal Fluid  Result Value Ref Range Status   Specimen Description CSF  Final   Special Requests NONE  Final   Gram Stain   Final    WBC PRESENT,BOTH PMN AND MONONUCLEAR NO ORGANISMS SEEN CYTOSPIN SMEAR    Culture   Final    NO GROWTH 3 DAYS Performed at [facility], [place] [address], [place], [place] [address]    Report Status 03/14/2019 FINAL  Final  MRSA PCR Screening     Status: None   Collection Time: 03/12/19  9:29 AM   Specimen: Nasal Mucosa; Nasopharyngeal  Result Value Ref Range Status   MRSA by PCR NEGATIVE NEGATIVE Final    Comment:        The GeneXpert MRSA Assay (FDA approved for NASAL specimens only), is one component of a comprehensive MRSA colonization surveillance program. It is not intended to diagnose MRSA infection nor to guide or monitor treatment for MRSA infections. Performed at [facility], [place] [address], [place], [place] [address]       Radiology Studies: No results found.   LOS: 8 days   Time spent: More than 50% of that time was spent in counseling and/or coordination of care.  Antonieta Pert, MD [facility]  03/18/2019, 11:45 AM

## 2019-03-18 NOTE — Progress Notes (Signed)
Physical Therapy Treatment Patient Details Name: [name] MRN: [id number] DOB: 06/19/1940 Today's Date: 03/18/2019    History of Present Illness Pt is is an 79 y.o. 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 Daughter reports jerking movements of pt's legs and odd chewing motions prior to admit. No szs found on EEG. Pt had multiple falls prior to admit and found to have toxic & metabolic encephalopathy.    PT Comments    Pt has been challenged to follow instructions today, and noted her resistance to moving knees despite the need to overcome stiffness of the joints.  Her awareness is not good as she keeps closing eyes during the session and is not fully aware of what is going on.  Will follow her up to try more transfers OOB and to increase standing tolerance as pt will permit.     Follow Up Recommendations  SNF;Supervision/Assistance - 24 hour     Equipment Recommendations  None recommended by PT    Recommendations for Other Services OT consult     Precautions / Restrictions Precautions Precautions: Fall Precaution Comments: incontinent Restrictions Weight Bearing Restrictions: No    Mobility  Bed Mobility Overal bed mobility: Needs Assistance Bed Mobility: Supine to Sit;Sit to Supine     Supine to sit: Total assist Sit to supine: Total assist   General bed mobility comments: max assist for scooting up bed  Transfers                    Ambulation/Gait                 Stairs             Wheelchair Mobility    Modified Rankin (Stroke Patients Only)       Balance Overall balance assessment: Needs assistance   Sitting balance-Leahy Scale: Fair Sitting balance - Comments: statically                                    Cognition Arousal/Alertness: Awake/alert Behavior During Therapy: Flat affect Overall Cognitive Status: Impaired/Different from baseline Area of Impairment: Problem solving;Awareness;Safety/judgement;Following commands;Attention                 Orientation Level: Disoriented to;Place;Time;Situation Current Attention Level: Selective Memory: Decreased recall of precautions;Decreased short-term memory Following Commands: Follows one step commands inconsistently;Follows one step commands with increased time Safety/Judgement: Decreased awareness of deficits Awareness: Intellectual Problem Solving: Slow processing;Decreased initiation;Difficulty sequencing;Requires verbal cues;Requires tactile cues General Comments: actively resisting ROM to legs      Exercises General Exercises - Lower Extremity Ankle Circles/Pumps: AAROM;5 reps Quad Sets: AROM;AAROM;10 reps Heel Slides: PROM;15 reps Hip ABduction/ADduction: PROM;AAROM;10 reps Straight Leg Raises: AAROM;PROM;10 reps Hip Flexion/Marching: AAROM;PROM;10 reps    General Comments        Pertinent Vitals/Pain Pain Assessment: Faces Faces Pain Scale: Hurts little more Pain Location: knees with ROM Pain Descriptors / Indicators: Guarding Pain Intervention(s): Limited activity within patient's tolerance;Monitored during session;Repositioned    Home Living                      Prior Function            PT  Goals (current goals can now be found in the care plan section) Acute Rehab PT Goals Patient Stated Goal: to go home Progress towards PT goals: Not progressing toward goals - comment    Frequency    Min 2X/week      PT Plan Current plan remains appropriate    Co-evaluation              AM-PAC PT "6 Clicks" Mobility   Outcome Measure  Help needed turning from your back to your side while in a flat bed without using bedrails?: Total Help needed moving from lying on your  back to sitting on the side of a flat bed without using bedrails?: Total Help needed moving to and from a bed to a chair (including a wheelchair)?: Total Help needed standing up from a chair using your arms (e.g., wheelchair or bedside chair)?: Total Help needed to walk in hospital room?: Total Help needed climbing 3-5 steps with a railing? : Total 6 Click Score: 6    End of Session   Activity Tolerance: Patient limited by fatigue;Other (comment)(cognition) Patient left: in bed;with call bell/phone within reach;with bed alarm set Nurse Communication: Mobility status PT Visit Diagnosis: Unsteadiness on feet (R26.81);Other abnormalities of gait and mobility (R26.89);Muscle weakness (generalized) (M62.81);History of falling (Z91.81)     Time: 5397-6734 PT Time Calculation (min) (ACUTE ONLY): 23 min  Charges:  $Therapeutic Exercise: 8-22 mins $Therapeutic Activity: 8-22 mins                    Ramond Dial 03/18/2019, 4:51 PM  Mee Hives, PT MS Acute Rehab Dept. Number: [place] [facility] [place]

## 2019-03-18 NOTE — Progress Notes (Signed)
Occupational Therapy Treatment Patient Details Name: [name] MRN: [id number] DOB: 1940-02-07 Today's Date: 03/18/2019    History of present illness Pt is is an 79 y.o. 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 Daughter reports jerking movements of pt's legs and odd chewing motions prior to admit. No szs found on EEG. Pt had multiple falls prior to admit and found to have toxic & metabolic encephalopathy.   OT comments  Pt agreeable to sitting EOB. Total assist for bed mobility. Sat x 20 min at EOB with fair sitting balance. Pt not wanting to eat her lunch, but drank OJ and participated in grooming. Pt continues to demonstrate impaired cognition, but his pleasant and appropriate.  Follow Up Recommendations  SNF    Equipment Recommendations  Hospital bed;Wheelchair (measurements OT);Wheelchair cushion (measurements OT)(hoyer lift)    Recommendations for Other Services      Precautions / Restrictions Precautions Precautions: Fall Precaution Comments: incontinent Restrictions Weight Bearing Restrictions: No       Mobility Bed Mobility Overal bed mobility: Needs Assistance Bed Mobility: Supine to Sit;Sit to Supine     Supine to sit: Total assist Sit to supine: Total assist   General bed mobility comments: assist for all aspects  Transfers                      Balance Overall balance assessment: Needs assistance   Sitting balance-Leahy Scale: Fair Sitting balance - Comments: statically                                   ADL either performed or assessed with clinical judgement   ADL Overall ADL's : Needs assistance/impaired Eating/Feeding: Set up;Sitting Eating/Feeding Details (indicate cue type and reason): drank  from straw only Grooming: Wash/dry hands;Wash/dry face;Sitting;Minimal assistance Grooming Details (indicate cue type and reason): decreased thoroughness Upper Body Bathing: Moderate assistance;Sitting Upper Body Bathing Details (indicate cue type and reason): washed and applied lotion to back                           General ADL Comments: sat EOB x 20 min     Vision       Perception     Praxis      Cognition Arousal/Alertness: Awake/alert Behavior During Therapy: WFL for tasks assessed/performed Overall Cognitive Status: Impaired/Different from baseline Area of Impairment: Orientation;Attention;Memory;Following commands;Safety/judgement;Awareness;Problem solving                 Orientation Level: Disoriented to;Place;Time;Situation Current Attention Level: Alternating Memory: Decreased short-term memory Following Commands: Follows one step commands inconsistently;Follows one step commands with increased time Safety/Judgement: Decreased awareness of safety;Decreased awareness of deficits Awareness: Intellectual Problem Solving: Slow processing;Decreased initiation;Difficulty sequencing;Requires verbal cues;Requires tactile cues          Exercises     Shoulder Instructions       General Comments      Pertinent Vitals/ Pain       Pain Assessment: Faces Faces Pain Scale: Hurts little more Pain Location: generalized Pain Descriptors / Indicators: Grimacing;Moaning Pain Intervention(s): Monitored during session;Repositioned  Home Living  Prior Functioning/Environment              Frequency  Min 2X/week        Progress Toward Goals  OT Goals(current goals can now be found in the care plan section)  Progress towards OT goals: Progressing toward goals  Acute Rehab OT Goals Patient Stated Goal: to go home OT Goal Formulation: Patient unable to participate in goal setting Time  For Goal Achievement: 03/26/19 Potential to Achieve Goals: [place] Discharge plan remains appropriate    Co-evaluation                 AM-PAC OT "6 Clicks" Daily Activity     Outcome Measure   Help from another person eating meals?: A Little Help from another person taking care of personal grooming?: A Little Help from another person toileting, which includes using toliet, bedpan, or urinal?: Total Help from another person bathing (including washing, rinsing, drying)?: A Lot Help from another person to put on and taking off regular upper body clothing?: A Lot Help from another person to put on and taking off regular lower body clothing?: Total 6 Click Score: 12    End of Session    OT Visit Diagnosis: Unsteadiness on feet (R26.81);Muscle weakness (generalized) (M62.81);Other symptoms and signs involving cognitive function   Activity Tolerance Patient tolerated treatment well   Patient Left in bed;with call bell/phone within reach;with bed alarm set   Nurse Communication (drank juice, refused lunch)        Time: 4097-3532 OT Time Calculation (min): 26 min  Charges: OT General Charges $OT Visit: 1 Visit OT Treatments $Self Care/Home Management : 23-37 mins [name], OTR/L Acute Rehabilitation Services Pager: [phone] Office: [phone]   [name] 03/18/2019, 1:06 PM

## 2019-03-19 LAB — GLUCOSE, CAPILLARY
Glucose-Capillary: 125 mg/dL — ABNORMAL HIGH (ref 70–99)
Glucose-Capillary: 212 mg/dL — ABNORMAL HIGH (ref 70–99)
Glucose-Capillary: 113 mg/dL — ABNORMAL HIGH (ref 70–99)
Glucose-Capillary: 109 mg/dL — ABNORMAL HIGH (ref 70–99)

## 2019-03-19 LAB — BASIC METABOLIC PANEL
Anion gap: 8 (ref 5–15)
BUN: 28 mg/dL — ABNORMAL HIGH (ref 8–23)
CO2: 20 mmol/L — ABNORMAL LOW (ref 22–32)
Calcium: 8.1 mg/dL — ABNORMAL LOW (ref 8.9–10.3)
Chloride: 117 mmol/L — ABNORMAL HIGH (ref 98–111)
Creatinine, Ser: 1.8 mg/dL — ABNORMAL HIGH (ref 0.44–1.00)
GFR calc Af Amer: 30 mL/min — ABNORMAL LOW (ref >60)
GFR calc non Af Amer: 26 mL/min — ABNORMAL LOW (ref >60)
Glucose, Bld: 136 mg/dL — ABNORMAL HIGH (ref 70–99)
Potassium: 3.9 mmol/L (ref 3.5–5.1)
Sodium: 145 mmol/L (ref 135–145)

## 2019-03-19 LAB — CBC
HCT: 22.1 % — ABNORMAL LOW (ref 36.0–46.0)
Hemoglobin: 7.1 g/dL — ABNORMAL LOW (ref 12.0–15.0)
MCH: 29.3 pg (ref 26.0–34.0)
MCHC: 32.1 g/dL (ref 30.0–36.0)
MCV: 91.3 fL (ref 80.0–100.0)
Platelets: 415 10*3/uL — ABNORMAL HIGH (ref 150–400)
RBC: 2.42 MIL/uL — ABNORMAL LOW (ref 3.87–5.11)
RDW: 19.4 % — ABNORMAL HIGH (ref 11.5–15.5)
WBC: 8.9 10*3/uL (ref 4.0–10.5)
nRBC: 0.3 % — ABNORMAL HIGH (ref 0.0–0.2)

## 2019-03-19 NOTE — Progress Notes (Signed)
PROGRESS NOTE    [name]  WHQ:[id number] DOB: [birth year] DOA: 03/09/2019 PCP: Julia Jordan, MD   Brief Narrative: s per HPI: 79 y.o.femalewith PMHhypothyroidism,hx of craniectomy(for frontal temporary brain meningioma resection), CKD 4, rheumatoid arthritis,DMchronic anemia, chronic pain, recent COVID infection for which she was treated to [facility] and discharged few weeks ago, aortic stenosis.  Who lives at home and lately was having some diarrhea and generalized weakness, had multiple falls at home around 3-4 and thereafter became increasingly confused.  Was brought to the ER where she was found to have low-grade fever, had toxic and metabolic encephalopathy, dehydration, neurology was consulted.  She was started on empiric treatment for meningitis and admitted to the hospital.  Further work-up revealed that she simply was having seizures in response to severe colitis found on CT scan and further work-up.  She was started on Keppra mentation has improved, she is on Rocephin and Flagyl for colitis.  No diarrhea.  Clinically much better.  Patient was transferred out of Covid unit 3/17 as he was no longer needed quarantine, was beyond 21 days from initial Covid diagnosis. Patient responding slowly with IV Flagyl.  Subjective:  Patient is alert awake mildly confused, reports he feels that his stomach pain is improving better compared to when she came in.   Bowel movement last night.  Afebrile overnight saturating well on room air. BMP shows improving creatinine 1.8, bicarb 20, CBC pending as it was clotted and needs to be redrawn.   Assessment & Plan:  Acute toxic metabolic encephalopathy: Likely multifactorial in the setting of colitis, multiple comorbidities, recent Covid pneumonia.  Mildly confused but now more interactive following commands.  More alert awake. Continue on supportive care. She had extensive evaluation with neurology consultation, CT head MRI brain lumbar  puncture which were all unremarkable.  EEG suspicious for epileptiform focus and is placed on Keppra.  Continue PT OT.   CT evidence of severe colitis:?  Infectious versus ischemic.  Febrile, last WBC count stable.  Follow-up CBC today. H&H was holding stable in mid 7 g range.CRP down to 1.1. Patient has history of diverticulitis with perforation in the past with a status post small bowel and partial colectomy in 2012, last colonoscopy in 2013.  Currently on IV Flagyl- cont total 10 days of Flagyl (started-3/6).  Appreciate gastroenterology input-  Had repeat CT abdomen w/o contrast given ongoing abdominal pain 3/12- "Persistent colonic wall thickening involving the distal transverse and descending colon, residual/persistent colitis. Additional colonic involvement of the more proximal distal colon has resolved over the past 6 days'.  Recent COVID-19 pneumonia was fully treated, out of quarantine. Overall stable/still elevated inflammatory biomarkers.  Currently saturating well on room air.  Urine Retention: Foley removed 3/9.  CT abdomen showed distended urinary bladder with mild hydronephrosis order bladder scan and in and out cath-May need Foley back in. Continue Flomax.  Monitor output.  History of meningioma/craniotomy in the past, currently stable.  CT head with no acute finding.  History of moderate to severe AS: No acute disease.  Outpatient follow-up to consider ?[facility]  Anemia of chronic disease: Hemoglobin stable with 7 g range.  Monitor.  Patient is Jehov[employer]'s Witness and does not want transfusion at any cost. Continue iron supplementation, PPI.  Off heparin/Lovenox due to downtrending hemoglobin and abdominal hematoma.  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  Monitor closely.  Functions overall stable AND IMPROVING Recent Labs  Lab 03/15/19 0448 03/16/19 0457 03/17/19 0408 03/18/19 0902 03/19/19  0801  BUN 24* 24* 28* 27* 28*  CREATININE 2.23* 2.14* 2.06* 1.61* 0.96*   Metabolic  acidosis with bicarb at 1720.  Monitor at this is likely from CKD.  Mild hypokalemia -improved  Elevated D-dimer with history of DVT negative for DVT on duplex.  Chemical prophylaxis currently on hold due to dropping H&H since subcutaneous hematoma in the abdomen on 3/8.  Continue SCD.  Monitor.  This could be in the setting of her recent Covid pneumonia.  Fungal UTI received dose of Diflucan.  Hypomagnesemia treated.  Monitor.  Hypothyroidism: TSH and cortisol stable.  Cont po Synthroid.  Diabetes mellitus, type II : Stable sugar and stable A1c 6.6.  Monitor. Recent Labs  Lab 03/18/19 0705 03/18/19 1128 03/18/19 1633 03/18/19 2033 03/19/19 0740  GLUCAP 129* 149* 162* 140* 125*   Body mass index is 30.55 kg/m.  DVT prophylaxis:SCD. Heparin stopped on 3/8 due to continued anemia and abdominal soft tissue hematoma Code Status: Partial-no intubation Family Communication: plan of care discussed with patient at bedside.  Daughter have been updated multiple times by prior attending.  I called daughter [name] no few times- not available- called [name], no answer. will I was able to get her left [name] and updated in evening 3/11-they plan to take her back home once improves. Disposition Plan: Patient is from:HOME-she lives w/ daughter and husband Anticipated Disposition: [place]. Barriers to discharge or conditions that needs to be met prior to discharge: Remains in the hospital for ongoing management of colitis with IV antibiotics, supportive measures, slowly responding, at this time remains in the hospital.  Plan discharge to home as patient/family does not want to skilled nursing facility, once signed out by GI.  Consultants: GI  Procedures:  CT abdomen 3/12 Persistent colonic wall thickening involving the distal transverse and descending colon, residual/persistent colitis. Additional colonic involvement of the more proximal distal colon has resolved over the past 6 days. 2. Distended  urinary bladder. Mild bilateral hydronephrosis is likely secondary to the degree of bladder distension. 3. Small bilateral pleural effusions with adjacent atelectasis. Additional patchy opacities at the lung bases were characterized on CT 6 days ago   CT head.  Nonacute.  EEG.  Final - This studyshowed evidence of epileptogenicity and cortical dysfunction in right frontocentral region.No seizures were seen throughout the recording.  Lower extremity venous duplex.  No DVT.  LP - 1 wbc  CT abdomen pelvis noncontrast- 1. Findings are concerning for severe colitis, as above. 2. Findings in the lungs which likely reflect evolving post infectious or inflammatory scarring given the patient's history of recent COVID-19 infection. 3. Aortic atherosclerosis, in addition to left main and 3 vessel coronary artery disease. 4. There are calcifications of the aortic valve. Echocardiographic correlation for evaluation of potential valvular dysfunction may be warranted if clinically indicated. 5. Additional incidental findings, as above.  MRI brain.  Nonacute.  Microbiology:see note  Medications: Scheduled Meds:  famotidine  20 mg Oral Daily   feeding supplement  1 Container Oral TID BM   feeding supplement (PRO-STAT SUGAR FREE 64)  30 mL Oral TID WC   ferrous sulfate  325 mg Oral TID WC   insulin aspart  0-9 Units Subcutaneous TID WC   levETIRAcetam  500 mg Oral BID   levothyroxine  150 mcg Oral Q0600   tamsulosin  0.4 mg Oral Daily   Continuous Infusions:  metronidazole 500 mg (03/19/19 0318)    Antimicrobials: Anti-infectives (From admission, onward)   Start  Dose/Rate Route Frequency Ordered Stop   03/12/19 1200  metroNIDAZOLE (FLAGYL) IVPB 500 mg     500 mg 100 mL/hr over 60 Minutes Intravenous Every 8 hours 03/12/19 1048     03/12/19 1200  cefTRIAXone (ROCEPHIN) 2 g in sodium chloride 0.9 % 100 mL IVPB     2 g 200 mL/hr over 30 Minutes Intravenous Every 24 hours  03/12/19 1049 03/14/19 1241   03/12/19 1033  vancomycin variable dose per unstable renal function (pharmacist dosing)  Status:  Discontinued      Does not apply See admin instructions 03/12/19 1033 03/12/19 1048   03/11/19 2300  meropenem (MERREM) 1 g in sodium chloride 0.9 % 100 mL IVPB  Status:  Discontinued     1 g 200 mL/hr over 30 Minutes Intravenous Every 12 hours 03/11/19 2217 03/12/19 1048   03/11/19 1130  vancomycin (VANCOCIN) IVPB 1000 mg/200 mL premix  Status:  Discontinued     1,000 mg 200 mL/hr over 60 Minutes Intravenous Every 48 hours 03/11/19 1032 03/12/19 1033   03/11/19 1130  fluconazole (DIFLUCAN) IVPB 100 mg     100 mg 50 mL/hr over 60 Minutes Intravenous  Once 03/11/19 1035 03/12/19 0943   03/11/19 0600  acyclovir (ZOVIRAX) 800 mg in dextrose 5 % 150 mL IVPB  Status:  Discontinued     800 mg 166 mL/hr over 60 Minutes Intravenous Every 24 hours 03/10/19 1234 03/11/19 1715   03/10/19 1600  meropenem (MERREM) 1 g in sodium chloride 0.9 % 100 mL IVPB  Status:  Discontinued     1 g 200 mL/hr over 30 Minutes Intravenous Every 12 hours 03/10/19 1247 03/11/19 2217   03/10/19 0032  vancomycin variable dose per unstable renal function (pharmacist dosing)  Status:  Discontinued      Does not apply See admin instructions 03/10/19 0032 03/11/19 1032   03/09/19 2330  acyclovir (ZOVIRAX) 800 mg in dextrose 5 % 150 mL IVPB  Status:  Discontinued     800 mg 166 mL/hr over 60 Minutes Intravenous Every 8 hours 03/09/19 2321 03/10/19 1234   03/09/19 2330  meropenem (MERREM) 1 g in sodium chloride 0.9 % 100 mL IVPB  Status:  Discontinued     1 g 200 mL/hr over 30 Minutes Intravenous Every 8 hours 03/09/19 2324 03/10/19 1247   03/09/19 2330  vancomycin (VANCOREADY) IVPB 1500 mg/300 mL     1,500 mg 150 mL/hr over 120 Minutes Intravenous  Once 03/09/19 2324 03/10/19 0601       Objective: Vitals: Today's Vitals   03/18/19 2036 03/18/19 2039 03/19/19 0345 03/19/19 0945  BP:  (!)  137/53 (!) 144/59 (!) 138/51  Pulse:  81 74 75  Resp:  _0 Temp:  98.5 F (36.9 C) 98 F (36.7 C) 97.8 F (36.6 C)  TempSrc:  Oral Oral Oral  SpO2:  100% 99% 100%  Weight:      Height:      PainSc: 0-No pain       Intake/Output Summary (Last 24 hours) at 03/19/2019 1041 Last data filed at 03/19/2019 0900 Gross per 24 hour  Intake 1020 ml  Output 504 ml  Net 516 ml   Filed Weights   03/14/19 2126 03/16/19 2028 03/17/19 2118  Weight: 85 kg 85 kg 80.7 kg   Weight change:    Intake/Output from previous day: 03/12 0701 - 03/13 0700 In: 900 [P.O.:600; IV Piggyback:300] Out: 504 [Urine:502; Stool:2] Intake/Output this shift: Total I/O In: 240 [  P.O.:240] Out: 0   Examination:  General exam: Alert awake oriented to place, frail, interactive communicative.  On room air.   HEENT:Oral mucosa moist, Ear/Nose WNL grossly,dentition normal. Respiratory system: bilaterally clear, no use of accessory muscle, non tender. Cardiovascular system: S1 & S2 +, regular, No JVD. Gastrointestinal system: Abdomen soft mild generalized tenderness present, ecchymosis/hematoma present on the lower abdomen and clearing.   Nervous System:Alert, awake, moving extremities and grossly nonfocal Extremities: No edema, distal peripheral pulses palpable.  Skin: No rashes,no icterus. MSK: Normal muscle bulk,tone, power  Data Reviewed: I have personally reviewed following labs and imaging studies CBC: Recent Labs  Lab 03/13/19 0221 03/13/19 0221 03/14/19 0456 03/15/19 0448 03/16/19 0457 03/17/19 0408 03/18/19 0902  WBC 12.5*   < > 8.4 7.8 8.1 7.7 10.1  NEUTROABS 9.5*  --  5.4 4.6 5.4 4.5  --   HGB 7.1*   < > 6.7* 7.1* 8.4* 7.4* 7.5*  HCT 21.5*   < > 20.7* 22.1* 26.4* 23.1* 23.4*  MCV 88.1   < > 88.8 91.3 89.8 90.6 91.1  PLT 295   < > 303 346 387 360 406*   < > = values in this interval not displayed.   Basic Metabolic Panel: Recent Labs  Lab 03/13/19 0221 03/13/19 0221 03/14/19 0456  03/14/19 0456 03/15/19 0448 03/16/19 0457 03/17/19 0408 03/18/19 0902 03/19/19 0801  NA 144   < > 144   < > 142 144 144 144 145  K 4.1   < > 3.7   < > 3.9 3.8 3.4* 3.7 3.9  CL 114*   < > 114*   < > 111 114* 114* 117* 117*  CO2 21*   < > 21*   < > 19* 20* 20* 17* 20*  GLUCOSE 123*   < > 106*   < > 107* 119* 114* 139* 136*  BUN 27*   < > 26*   < > 24* 24* 28* 27* 28*  CREATININE 2.55*   < > 2.32*   < > 2.23* 2.14* 2.06* 1.91* 1.80*  CALCIUM 8.1*   < > 8.0*   < > 8.0* 8.4* 8.1* 8.3* 8.1*  MG 1.9  --  1.7  --  1.6* 2.0 1.7  --   --    < > = values in this interval not displayed.   GFR: Estimated Creatinine Clearance: 25.6 mL/min (A) (by C-G formula based on SCr of 1.8 mg/dL (H)). Liver Function Tests: Recent Labs  Lab 03/13/19 0221 03/14/19 0456 03/15/19 0448 03/16/19 0457 03/17/19 0408  AST 15 13* 18 30 32  ALT _0 ALKPHOS 92 94 110 150* 134*  BILITOT 0.8 0.6 0.3 0.5 0.2*  PROT 4.9* 4.6* 4.6* 5.3* 4.9*  ALBUMIN 1.5* 1.4* 1.5* 1.8* 1.6*   No results for input(s): LIPASE, AMYLASE in the last 168 hours. No results for input(s): AMMONIA in the last 168 hours. Coagulation Profile: No results for input(s): INR, PROTIME in the last 168 hours. Cardiac Enzymes: No results for input(s): CKTOTAL, CKMB, CKMBINDEX, TROPONINI in the last 168 hours. BNP (last 3 results) No results for input(s): PROBNP in the last 8760 hours. HbA1C: No results for input(s): HGBA1C in the last 72 hours. CBG: Recent Labs  Lab 03/18/19 0705 03/18/19 1128 03/18/19 1633 03/18/19 2033 03/19/19 0740  GLUCAP 129* 149* 162* 140* 125*   Lipid Profile: No results for input(s): CHOL, HDL, LDLCALC, TRIG, CHOLHDL, LDLDIRECT in the last 72 hours. Thyroid Function Tests:  No results for input(s): TSH, T4TOTAL, FREET4, T3FREE, THYROIDAB in the last 72 hours. Anemia Panel: No results for input(s): VITAMINB12, FOLATE, FERRITIN, TIBC, IRON, RETICCTPCT in the last 72 hours. Sepsis Labs: No results for  input(s): PROCALCITON, LATICACIDVEN in the last 168 hours.  Recent Results (from the past 240 hour(s))  Blood Culture (routine x 2)     Status: None   Collection Time: 03/09/19  7:44 PM   Specimen: BLOOD  Result Value Ref Range Status   Specimen Description BLOOD RIGHT ANTECUBITAL  Final   Special Requests   Final    BOTTLES DRAWN AEROBIC AND ANAEROBIC Blood Culture results may not be optimal due to an inadequate volume of blood received in culture bottles   Culture   Final    NO GROWTH 5 DAYS Performed at [facility], [place] [address], [place], [place] [address]    Report Status 03/14/2019 FINAL  Final  Blood Culture (routine x 2)     Status: None   Collection Time: 03/09/19  7:49 PM   Specimen: BLOOD  Result Value Ref Range Status   Specimen Description BLOOD LEFT ANTECUBITAL  Final   Special Requests   Final    BOTTLES DRAWN AEROBIC AND ANAEROBIC Blood Culture results may not be optimal due to an inadequate volume of blood received in culture bottles   Culture   Final    NO GROWTH 5 DAYS Performed at [facility], [place] [address], [place], [place] [address]    Report Status 03/14/2019 FINAL  Final  Urine culture     Status: Abnormal   Collection Time: 03/09/19  8:00 PM   Specimen: In/Out Cath Urine  Result Value Ref Range Status   Specimen Description IN/OUT CATH URINE  Final   Special Requests   Final    NONE Performed at [facility], [place] [address] [place], [place] [address]    Culture >=100,000 COLONIES/mL YEAST (A)  Final   Report Status 03/10/2019 FINAL  Final  CSF culture     Status: None   Collection Time: 03/11/19  2:48 PM   Specimen: PATH Cytology CSF; Cerebrospinal Fluid  Result Value Ref Range Status   Specimen Description CSF  Final   Special Requests NONE  Final   Gram Stain   Final    WBC PRESENT,BOTH PMN AND MONONUCLEAR NO ORGANISMS SEEN CYTOSPIN SMEAR    Culture   Final    NO GROWTH 3 DAYS Performed at [facility],  [place] [address], [place], [place] [address]    Report Status 03/14/2019 FINAL  Final  MRSA PCR Screening     Status: None   Collection Time: 03/12/19  9:29 AM   Specimen: Nasal Mucosa; Nasopharyngeal  Result Value Ref Range Status   MRSA by PCR NEGATIVE NEGATIVE Final    Comment:        The GeneXpert MRSA Assay (FDA approved for NASAL specimens only), is one component of a comprehensive MRSA colonization surveillance program. It is not intended to diagnose MRSA infection nor to guide or monitor treatment for MRSA infections. Performed at [facility], [place] [address], [place], [place] [address]       Radiology Studies: CT ABDOMEN PELVIS WO CONTRAST  Result Date: 03/18/2019 CLINICAL DATA:  Ongoing abdominal pain. Recent severe colitis. EXAM: CT ABDOMEN AND PELVIS WITHOUT CONTRAST TECHNIQUE: Multidetector CT imaging of the abdomen and pelvis was performed following the standard protocol without IV contrast. COMPARISON:  CT 03/12/2019 FINDINGS: Lower chest: Small pleural effusions with adjacent atelectasis. Additional patchy opacity at the lung bases which were characterized on CT 6 days ago. Trace pericardial effusion, unchanged. Hepatobiliary: Unchanged cyst in the left lobe of the liver. No new hepatic abnormality. No calcified gallstone or biliary dilatation. No pericholecystic inflammation. Pancreas: Parenchymal atrophy. No ductal dilatation or inflammation. Spleen: Normal in size without focal abnormality. Adrenals/Urinary Tract: Normal adrenal glands. Mild bilateral hydronephrosis, right greater than left with perinephric edema. No ureteral calculi or obstruction. The urinary bladder is distended without bladder wall thickening. Probable cyst in the lower kidney again seen. Stomach/Bowel: Ingested material in the stomach. No gastric wall thickening. Few air-fluid levels within nondilated small bowel. Enteric sutures in the cecum. Persistent colonic wall thickening involving the distal  transverse and descending colon with pericolonic edema. Additional areas of colonic wall thickening involving the more proximal and distal most colon have resolved. Enteric sutures in the sigmoid. Vascular/Lymphatic: Aortic atherosclerosis. No aortic aneurysm. Calcification adjacent to the duodenum is likely a calcified lymph nodes, unchanged. No enlarged lymph nodes in the abdomen or pelvis. Reproductive: Status post hysterectomy. No adnexal masses. Other: Subcutaneous soft tissue densities in the anterior abdominal wall typical of medication injection sites. No ascites. No free air. Musculoskeletal: Multilevel degenerative change in the spine. There are no acute or suspicious osseous abnormalities. IMPRESSION: 1. Persistent colonic wall thickening involving the distal transverse and descending colon, residual/persistent colitis. Additional colonic involvement of the more proximal distal colon has resolved over the past 6 days. 2. Distended urinary bladder. Mild bilateral hydronephrosis is likely secondary to the degree of bladder distension. 3. Small bilateral pleural effusions with adjacent atelectasis. Additional patchy opacities at the lung bases were characterized on CT 6 days ago. Aortic Atherosclerosis (ICD10-I70.0). Electronically Signed   By: Keith Rake M.D.   On: 03/18/2019 16:26     LOS: 9 days   Time spent: More than 50% of that time was spent in counseling and/or coordination of care.  Antonieta Pert, MD [facility]  03/19/2019, 10:41 AM

## 2019-03-19 NOTE — Progress Notes (Signed)
Subjective: She was seen and examined at bedside. She states there is improvement in abdominal pain.  Objective: Vital signs in last 24 hours: Temp:  [97.8 F (36.6 C)-98.5 F (36.9 C)] 97.8 F (36.6 C) (03/13 0945) Pulse Rate:  [72-81] 75 (03/13 0945) Resp:  [17-19] 18 (03/13 0945) BP: (132-144)/(51-59) 138/51 (03/13 0945) SpO2:  [99 %-100 %] 100 % (03/13 0945) Weight change:  Last BM Date: 03/18/19  PE: prominant pallor, chronically ill-appearing GENERAL: Not in distress, able to speak in full sentences ABDOMEN: Mild tenderness, normoactive bowel sounds, nondistended EXTREMITIES: no Deformity  Lab Results: Results for orders placed or performed during the hospital encounter of 03/09/19 (from the past 48 hour(s))  Glucose, capillary     Status: None   Collection Time: 03/17/19  4:43 PM  Result Value Ref Range   Glucose-Capillary 94 70 - 99 mg/dL    Comment: Glucose reference range applies only to samples taken after fasting for at least 8 hours.  Glucose, capillary     Status: Abnormal   Collection Time: 03/17/19  9:19 PM  Result Value Ref Range   Glucose-Capillary 183 (H) 70 - 99 mg/dL    Comment: Glucose reference range applies only to samples taken after fasting for at least 8 hours.  Glucose, capillary     Status: Abnormal   Collection Time: 03/18/19  7:05 AM  Result Value Ref Range   Glucose-Capillary 129 (H) 70 - 99 mg/dL    Comment: Glucose reference range applies only to samples taken after fasting for at least 8 hours.  Basic metabolic panel     Status: Abnormal   Collection Time: 03/18/19  9:02 AM  Result Value Ref Range   Sodium 144 135 - 145 mmol/L   Potassium 3.7 3.5 - 5.1 mmol/L   Chloride 117 (H) 98 - 111 mmol/L   CO2 17 (L) 22 - 32 mmol/L   Glucose, Bld 139 (H) 70 - 99 mg/dL    Comment: Glucose reference range applies only to samples taken after fasting for at least 8 hours.   BUN 27 (H) 8 - 23 mg/dL   Creatinine, Ser 1.91 (H) 0.44 - 1.00 mg/dL   Calcium 8.3 (L) 8.9 - 10.3 mg/dL   GFR calc non Af Amer 24 (L) >60 mL/min   GFR calc Af Amer 28 (L) >60 mL/min   Anion gap 10 5 - 15    Comment: Performed at [facility] [address], [place], [place] [address]  CBC     Status: Abnormal   Collection Time: 03/18/19  9:02 AM  Result Value Ref Range   WBC 10.1 4.0 - 10.5 K/uL   RBC 2.57 (L) 3.87 - 5.11 MIL/uL   Hemoglobin 7.5 (L) 12.0 - 15.0 g/dL   HCT 23.4 (L) 36.0 - 46.0 %   MCV 91.1 80.0 - 100.0 fL   MCH 29.2 26.0 - 34.0 pg   MCHC 32.1 30.0 - 36.0 g/dL   RDW 18.8 (H) 11.5 - 15.5 %   Platelets 406 (H) 150 - 400 K/uL   nRBC 0.4 (H) 0.0 - 0.2 %    Comment: Performed at [facility] [address] [place], [place] [address]  Glucose, capillary     Status: Abnormal   Collection Time: 03/18/19 11:28 AM  Result Value Ref Range   Glucose-Capillary 149 (H) 70 - 99 mg/dL    Comment: Glucose reference range applies only to samples taken after fasting for at least 8  hours.  Glucose, capillary     Status: Abnormal   Collection Time: 03/18/19  4:33 PM  Result Value Ref Range   Glucose-Capillary 162 (H) 70 - 99 mg/dL    Comment: Glucose reference range applies only to samples taken after fasting for at least 8 hours.  Glucose, capillary     Status: Abnormal   Collection Time: 03/18/19  8:33 PM  Result Value Ref Range   Glucose-Capillary 140 (H) 70 - 99 mg/dL    Comment: Glucose reference range applies only to samples taken after fasting for at least 8 hours.  Glucose, capillary     Status: Abnormal   Collection Time: 03/19/19  7:40 AM  Result Value Ref Range   Glucose-Capillary 125 (H) 70 - 99 mg/dL    Comment: Glucose reference range applies only to samples taken after fasting for at least 8 hours.  Basic metabolic panel     Status: Abnormal   Collection Time: 03/19/19  8:01 AM  Result Value Ref Range   Sodium 145 135 - 145 mmol/L   Potassium 3.9 3.5 - 5.1 mmol/L   Chloride 117 (H) 98 - 111 mmol/L   CO2 20 (L) 22 - 32  mmol/L   Glucose, Bld 136 (H) 70 - 99 mg/dL    Comment: Glucose reference range applies only to samples taken after fasting for at least 8 hours.   BUN 28 (H) 8 - 23 mg/dL   Creatinine, Ser 1.80 (H) 0.44 - 1.00 mg/dL   Calcium 8.1 (L) 8.9 - 10.3 mg/dL   GFR calc non Af Amer 26 (L) >60 mL/min   GFR calc Af Amer 30 (L) >60 mL/min   Anion gap 8 5 - 15    Comment: Performed at [facility] [address], [place], [place] [address]  CBC     Status: Abnormal   Collection Time: 03/19/19 10:36 AM  Result Value Ref Range   WBC 8.9 4.0 - 10.5 K/uL   RBC 2.42 (L) 3.87 - 5.11 MIL/uL   Hemoglobin 7.1 (L) 12.0 - 15.0 g/dL   HCT 22.1 (L) 36.0 - 46.0 %   MCV 91.3 80.0 - 100.0 fL   MCH 29.3 26.0 - 34.0 pg   MCHC 32.1 30.0 - 36.0 g/dL   RDW 19.4 (H) 11.5 - 15.5 %   Platelets 415 (H) 150 - 400 K/uL   nRBC 0.3 (H) 0.0 - 0.2 %    Comment: Performed at [facility] [address], [place], [place] [address]  Glucose, capillary     Status: Abnormal   Collection Time: 03/19/19 11:52 AM  Result Value Ref Range   Glucose-Capillary 212 (H) 70 - 99 mg/dL    Comment: Glucose reference range applies only to samples taken after fasting for at least 8 hours.    Studies/Results: CT ABDOMEN PELVIS WO CONTRAST  Result Date: 03/18/2019 CLINICAL DATA:  Ongoing abdominal pain. Recent severe colitis. EXAM: CT ABDOMEN AND PELVIS WITHOUT CONTRAST TECHNIQUE: Multidetector CT imaging of the abdomen and pelvis was performed following the standard protocol without IV contrast. COMPARISON:  CT 03/12/2019 FINDINGS: Lower chest: Small pleural effusions with adjacent atelectasis. Additional patchy opacity at the lung bases which were characterized on CT 6 days ago. Trace pericardial effusion, unchanged. Hepatobiliary: Unchanged cyst in the left lobe of the liver. No new hepatic abnormality. No calcified gallstone or biliary dilatation. No pericholecystic inflammation. Pancreas: Parenchymal atrophy. No ductal  dilatation or inflammation. Spleen: Normal in size without  focal abnormality. Adrenals/Urinary Tract: Normal adrenal glands. Mild bilateral hydronephrosis, right greater than left with perinephric edema. No ureteral calculi or obstruction. The urinary bladder is distended without bladder wall thickening. Probable cyst in the lower kidney again seen. Stomach/Bowel: Ingested material in the stomach. No gastric wall thickening. Few air-fluid levels within nondilated small bowel. Enteric sutures in the cecum. Persistent colonic wall thickening involving the distal transverse and descending colon with pericolonic edema. Additional areas of colonic wall thickening involving the more proximal and distal most colon have resolved. Enteric sutures in the sigmoid. Vascular/Lymphatic: Aortic atherosclerosis. No aortic aneurysm. Calcification adjacent to the duodenum is likely a calcified lymph nodes, unchanged. No enlarged lymph nodes in the abdomen or pelvis. Reproductive: Status post hysterectomy. No adnexal masses. Other: Subcutaneous soft tissue densities in the anterior abdominal wall typical of medication injection sites. No ascites. No free air. Musculoskeletal: Multilevel degenerative change in the spine. There are no acute or suspicious osseous abnormalities. IMPRESSION: 1. Persistent colonic wall thickening involving the distal transverse and descending colon, residual/persistent colitis. Additional colonic involvement of the more proximal distal colon has resolved over the past 6 days. 2. Distended urinary bladder. Mild bilateral hydronephrosis is likely secondary to the degree of bladder distension. 3. Small bilateral pleural effusions with adjacent atelectasis. Additional patchy opacities at the lung bases were characterized on CT 6 days ago. Aortic Atherosclerosis (ICD10-I70.0). Electronically Signed   By: Keith Rake M.D.   On: 03/18/2019 16:26    Medications: I have reviewed the patient's current  medications.  Assessment: Severe colitis noted on CAT scan from 03/12/19 Repeat CT yesterday showed persistent colonic wall thickening involving distal transverse and descending with residual/persistent colitis however, involvement of more proximal distal colon has resolved in the last 6 days No leukocytosis Normocytic anemia Thrombocytosis Chronic kidney disease  Toxic/metabolic encephalopathy resolved Recent Covid pneumonia Jehovah's Witness, refuses blood transfusion even if it were to be life-threatening  Plan: Patient tolerating dysphagia level 3 diet Recommend repeat CAT scan in 4 weeks for evaluation of colitis No diarrhea and no leukocytosis, no plans for endoscopic intervention Colitis could be infectious versus ischemic. Recommend continuing Flagyl for another 7 days on discharge, discussed with Dr.KC.  GI will sign off, please recall if needed  Ronnette Juniper, MD 03/19/2019, 3:36 PM

## 2019-03-20 DIAGNOSIS — Z531 Procedure and treatment not carried out because of patient's decision for reasons of belief and group pressure: Secondary | ICD-10-CM

## 2019-03-20 DIAGNOSIS — D6489 Other specified anemias: Secondary | ICD-10-CM

## 2019-03-20 DIAGNOSIS — Z515 Encounter for palliative care: Secondary | ICD-10-CM

## 2019-03-20 DIAGNOSIS — Z7189 Other specified counseling: Secondary | ICD-10-CM

## 2019-03-20 LAB — CBC
HCT: 21.3 % — ABNORMAL LOW (ref 36.0–46.0)
Hemoglobin: 6.8 g/dL — CL (ref 12.0–15.0)
MCH: 29.1 pg (ref 26.0–34.0)
MCHC: 31.9 g/dL (ref 30.0–36.0)
MCV: 91 fL (ref 80.0–100.0)
Platelets: 397 10*3/uL (ref 150–400)
RBC: 2.34 MIL/uL — ABNORMAL LOW (ref 3.87–5.11)
RDW: 19.4 % — ABNORMAL HIGH (ref 11.5–15.5)
WBC: 7.9 10*3/uL (ref 4.0–10.5)
nRBC: 0.4 % — ABNORMAL HIGH (ref 0.0–0.2)

## 2019-03-20 LAB — GLUCOSE, CAPILLARY
Glucose-Capillary: 110 mg/dL — ABNORMAL HIGH (ref 70–99)
Glucose-Capillary: 141 mg/dL — ABNORMAL HIGH (ref 70–99)
Glucose-Capillary: 152 mg/dL — ABNORMAL HIGH (ref 70–99)
Glucose-Capillary: 99 mg/dL (ref 70–99)

## 2019-03-20 LAB — BASIC METABOLIC PANEL
Anion gap: 9 (ref 5–15)
BUN: 24 mg/dL — ABNORMAL HIGH (ref 8–23)
CO2: 20 mmol/L — ABNORMAL LOW (ref 22–32)
Calcium: 8.2 mg/dL — ABNORMAL LOW (ref 8.9–10.3)
Chloride: 116 mmol/L — ABNORMAL HIGH (ref 98–111)
Creatinine, Ser: 1.68 mg/dL — ABNORMAL HIGH (ref 0.44–1.00)
GFR calc Af Amer: 33 mL/min — ABNORMAL LOW (ref >60)
GFR calc non Af Amer: 28 mL/min — ABNORMAL LOW (ref >60)
Glucose, Bld: 111 mg/dL — ABNORMAL HIGH (ref 70–99)
Potassium: 3.4 mmol/L — ABNORMAL LOW (ref 3.5–5.1)
Sodium: 145 mmol/L (ref 135–145)

## 2019-03-20 LAB — HEMOGLOBIN AND HEMATOCRIT, BLOOD
HCT: 20.9 % — ABNORMAL LOW (ref 36.0–46.0)
Hemoglobin: 6.6 g/dL — CL (ref 12.0–15.0)

## 2019-03-20 MED ORDER — DARBEPOETIN ALFA 40 MCG/0.4ML IJ SOSY
40.0000 ug | PREFILLED_SYRINGE | Freq: Once | INTRAMUSCULAR | Status: AC
  Administered 2019-03-20: 40 ug via SUBCUTANEOUS
  Filled 2019-03-20: qty 0.4

## 2019-03-20 NOTE — Consult Note (Signed)
Consultation Note Date: 03/20/2019   Patient Name: [name]  DOB: 07/24/1940  MRN: [id number]  Age / Sex: 79 y.o., female  PCP: Jonathon Jordan, MD Referring Physician: Antonieta Pert, MD  Reason for Consultation: Establishing goals of care and Psychosocial/spiritual support  HPI/Patient Profile: 79 y.o. female  with past medical history of CKD Stage 4, chronic anemia, recent COVID-19 infection 2/18 requiring hospitalization 2/21, DM,  Hypothyroidism, h/o craniectomy (for frontotemporal resection, RA, Chronic pain, aortic stenosis was admitted on 03/09/2019 with altered MS and fever and worked up for toxic metabolic encephalopathy and seizures.  Mental status has cleared. She was diagnosed with severe colitis on 3/6. CT now shows persistent colon wall thickening of the distal transverse and descending with residual colitis. Although proximal distal colon has resolved. GI has plan for follow up CT in 4 weeks.  Hgb has continued to trend low at 6.6 with normochromic anemia. Patient is Jehovah Witness. Palliative care was asked to consult to clarify patient wishes and goals of care.   PATIENT clearly states that she is Jehovah Witness and she does not wish to ever have blood products. Her advance directive regarding blood products is on file in the medical record. Daughter [name] called.    Clinical Assessment and Goals of Care:  This NP Wadie Lessen reviewed medical records, received report from team, assessed the patient and then met at the patient's bedside along with daughter [name] to discuss diagnosis, prognosis, GOC, EOL wishes disposition and options.  Concept of  Palliative Care was discussed.  A detailed discussion was had today regarding advanced directives.  Concepts specific to code status, blood transfusion and rehospitalization was had.  The difference between a aggressive medical intervention path   and a palliative comfort care path for this patient at this time was had.  Values and goals of care important to patient and family were attempted to be elicited. The daughter states that her brother had Sickle Cell Disease and had a Hgb as low as 3. She feels her mother has previously rebounded from low hemeglobin and can again.  Natural trajectory and expectations at EOL were discussed.  Questions and concerns addressed.   Family encouraged to call with questions or concerns.    PMT will continue to support holistically.  SUMMARY OF RECOMMENDATIONS    Code Status/Advance Care Planning:  Limited code DNI in orders and confirmed with patient and daughter.   Palliative Prophylaxis:   Delirium Protocol and Frequent Pain Assessment  Additional Recommendations (Limitations, Scope, Preferences):  Full Scope Treatment   No whole blood products  Psycho-social/Spiritual:    Additional Recommendations: Education on Hospice  Prognosis:   Unable to determine  Discharge Planning:  Recommend OP Community based palliativ  Home with Home Health      Primary Diagnoses: Present on Admission: . Acute encephalopathy . CKD (chronic kidney disease) stage 3, GFR 30-59 ml/min (HCC) . Chronic congestive heart failure with left ventricular diastolic dysfunction ([place]) . HTN (hypertension) . Hyperlipidemia . Hypothyroidism . Seropositive rheumatoid arthritis  of multiple sites ([place]) . LBBB (left bundle branch block)   I have reviewed the medical record, interviewed the patient and family, and examined the patient. The following aspects are pertinent.  Past Medical History:  Diagnosis Date  . Allergic rhinitis   . Anemia   . Aortic stenosis 12/22/2012   Mild 3/14-2.35 m/s peak velocity  . Cataract 2018  . CHF (congestive heart failure) ([place])   . Chronic pain   . CKD (chronic kidney disease), stage III   . Diabetes mellitus   . Diverticulosis   . DVT (deep venous thrombosis) ([place])    . Glaucoma   . Hemorrhoids    internal  . HTN (hypertension)   . Hypercholesteremia   . Hyperlipidemia 12/22/2012  . Hypothyroidism   . Meningioma ([place]) 2008  . Osteoarthritis   . Peripheral neuropathy   . PPD positive    6 months ago  . PVC (premature ventricular contraction) 12/22/2012  . Rheumatoid arthritis(714.0)   . Sickle cell trait ([place])   . 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 Marital status: Married    Spouse name: Not on file  . Number of children: Not on file  . Years of education: Not on file  . Highest education level: Not on file  Occupational History  . Not on file  Tobacco Use  . Smoking status: Former Smoker    Packs/day: 0.10    Years: 2.00    Pack years: 0.20    Types: Cigarettes    Quit date: 01/06/1962    Years since quitting: 57.2  . Smokeless tobacco: Never Used  . Tobacco comment: Socially   Substance and Sexual Activity  . Alcohol use: Not Currently    Comment: occasionally  . Drug use: No  . Sexual activity: Not on file  Other Topics Concern  . Not on file  Social History Narrative   Daycare Provider   Drinks about 1 cup of coffee a day    Social Determinants of Health   Financial Resource Strain:   . Difficulty of Paying Living Expenses:   Food Insecurity:   . Worried About Charity fundraiser in the Last Year:   . Arboriculturist in the Last Year:   Transportation Needs:   . Film/video editor (Medical):   Marland Kitchen Lack of Transportation (Non-Medical):   Physical Activity:   . Days of Exercise per Week:   . Minutes of Exercise per Session:   Stress:   . Feeling of Stress :   Social Connections:   . Frequency of Communication with Friends and Family:   . Frequency of Social Gatherings with Friends and Family:   . Attends Religious Services:   . Active Member of Clubs or Organizations:   . Attends Archivist  Meetings:   Marland Kitchen Marital Status:    Family History  Problem Relation Age of Onset  . Hypotension Mother   . CVA Mother   . Diabetes Mother   . Hypertension Father   . Kidney failure Father    Scheduled Meds: . darbepoetin (ARANESP) injection - NON-DIALYSIS  40 mcg Subcutaneous Once  . famotidine  20 mg Oral Daily  . feeding supplement  1 Container Oral TID BM  . feeding supplement (PRO-STAT SUGAR FREE 64)  30 mL Oral TID WC  . ferrous sulfate  325 mg Oral  TID WC  . insulin aspart  0-9 Units Subcutaneous TID WC  . levETIRAcetam  500 mg Oral BID  . levothyroxine  150 mcg Oral Q0600  . tamsulosin  0.4 mg Oral Daily   Continuous Infusions: . metronidazole 500 mg (03/20/19 0500)   PRN Meds:.acetaminophen, albuterol Medications Prior to Admission:  Prior to Admission medications   Medication Sig Start Date End Date Taking? Authorizing Provider  abatacept 750 mg in sodium chloride 0.9 % 70 mL Inject 750 mg into the vein every 28 (twenty-eight) days. 09/21/18  Yes Deveshwar, Abel Presto, MD  acetaminophen (TYLENOL) 500 MG tablet Take 1,000 mg by mouth every 6 (six) hours as needed for mild pain.   Yes [provider]  allopurinol (ZYLOPRIM) 300 MG tablet Take 1 tablet (300 mg total) by mouth daily. 03/31/17 05/06/19 Yes Deveshwar, Abel Presto, MD  Ascorbic Acid (VITA-C PO) Take 1 capsule by mouth daily.   Yes [provider]  aspirin EC 81 MG EC tablet Take 1 tablet (81 mg total) by mouth daily. 10/22/15  Yes Dessa Phi, DO  Aspirin-Caffeine (BACK & BODY EXTRA STRENGTH) 500-32.5 MG TABS Take 1 tablet by mouth daily as needed (pain).   Yes [provider]  atorvastatin (LIPITOR) 80 MG tablet Take 80 mg by mouth daily.  06/22/16  Yes [provider]  Calcium Carb-Cholecalciferol (CALCIUM 600-D PO) Take 1 tablet by mouth daily.   Yes [provider]  Cholecalciferol (VITAMIN D3) 10 MCG (400 UNIT) tablet Take 400 Units by mouth daily.   Yes [provider]  clotrimazole-betamethasone (LOTRISONE) cream Apply 1 application topically daily as needed (rash/irritation).  05/29/16  Yes [provider]  cyclobenzaprine (FLEXERIL) 10 MG tablet Take 10 mg by mouth 3 (three) times daily as needed for muscle spasms.    Yes [provider]  ferrous sulfate (FER-IN-SOL) 75 (15 Fe) MG/ML SOLN Take 45 mg of iron by mouth daily.   Yes [provider]  HYDROcodone-acetaminophen (NORCO) 5-325 MG tablet Take 1 tablet by mouth every 6 (six) hours as needed for moderate pain. Patient taking differently: Take 1-2 tablets by mouth every 6 (six) hours as needed for moderate pain.  07/22/16  Yes Stover, Titorya, DPM  Insulin Isophane & Regular Human (NOVOLIN 70/30 FLEXPEN RELION) (70-30) 100 UNIT/ML PEN Inject 6-8 Units into the skin 3 (three) times daily as needed (CBG >140).   Yes [provider]  leflunomide (ARAVA) 10 MG tablet Take 1 tablet (10 mg total) by mouth daily. 12/16/18  Yes Deveshwar, Abel Presto, MD  levothyroxine (SYNTHROID) 150 MCG tablet Take 1 tablet (150 mcg total) by mouth daily before breakfast. 02/28/19  Yes Gherghe, Vella Redhead, MD  loperamide (IMODIUM) 2 MG capsule Take 2 mg by mouth as needed for diarrhea or loose stools.   Yes [provider]  Omega-3 Fatty Acids (FISH OIL PO) Take 1 capsule by mouth daily.   Yes [provider]  pregabalin (LYRICA) 75 MG capsule 1 bid Patient taking differently: Take 75 mg by mouth 2 (two) times daily.  09/30/16  Yes Deveshwar, Abel Presto, MD  vitamin B-12 (CYANOCOBALAMIN) 50 MCG tablet Take 50 mcg by mouth daily.   Yes [provider]  BD INSULIN SYRINGE ULTRAFINE 31G X 15/64" 0.3 ML MISC  11/02/15   [provider]   Allergies  Allergen Reactions  . Methotrexate Derivatives Other (See Comments)    Elevated creat. Levels  . Actemra [Tocilizumab] Rash  . Other Other (See Comments)  NO Blood products.  [name]- causes low blood  sugar (pt states she was taking medication but cant remember what it was for.)  . Penicillins Rash    Has patient had a PCN reaction causing immediate rash, facial/tongue/throat swelling, SOB or lightheadedness with hypotension: Yes Has patient had a PCN reaction causing severe rash involving mucus membranes or skin necrosis: Yes  Has patient had a PCN reaction that required hospitalization No Has patient had a PCN reaction occurring within the last 10 years: No If all of the above answers are "NO", then may proceed with Cephalosporin use.   . Chlorhexidine   . Remicade [Infliximab] Other (See Comments)    Inadequate response   . Zocor [Simvastatin] Other (See Comments)    Leg cramps    Review of Systems   Physical Exam Constitutional:      Appearance: Normal appearance.  HENT:     Head: Normocephalic and atraumatic.  Cardiovascular:     Rate and Rhythm: Normal rate.  Pulmonary:     Effort: Pulmonary effort is normal.  Musculoskeletal:     Right lower leg: Edema present.     Left lower leg: Edema present.  Skin:    General: Skin is warm and dry.     Coloration: Skin is pale.  Neurological:     Mental Status: She is alert and oriented to person, place, and time.  Psychiatric:        Mood and Affect: Mood normal.        Behavior: Behavior normal.        Thought Content: Thought content normal.        Judgment: Judgment normal.      Vital Signs: BP 133/66 (BP Location: Left Arm)   Pulse 73   Temp 99 F (37.2 C) (Oral)   Resp 18   Ht 5' 4"  (1.626 m)   Wt 80.8 kg   LMP  (LMP Unknown)   SpO2 97%   BMI 30.58 kg/m  Pain Scale: 0-10   Pain Score: 0-No pain   SpO2: SpO2: 97 % O2 Device:SpO2: 97 % O2 Flow Rate: Room air.   IO: Intake/output summary:   Intake/Output Summary (Last 24 hours) at 03/20/2019 1019 Last data filed at 03/20/2019 0600 Gross per 24 hour  Intake 740 ml  Output 720 ml  Net 20 ml    LBM: Last BM Date: 03/19/19 Baseline Weight: Weight:  88 kg Most recent weight: Weight: 80.8 kg     Palliative Assessment/Data:50%   Discussed with Dr Lupita Leash  via secure chat Time In: 1100 Time Out: 1215 Time Total: 75 minutes Greater than 50%  of this time was spent counseling and coordinating care related to the above assessment and plan.  Signed by: Wadie Lessen, NP   Please contact Palliative Medicine Team phone at [phone] for questions and concerns.  For individual provider: See Shea Evans

## 2019-03-20 NOTE — Progress Notes (Signed)
PROGRESS NOTE    [name]  TIR:[id number] DOB: 1940-06-29 DOA: 03/09/2019 PCP: Jonathon Jordan, MD   Brief Narrative: As per HPI: 79 y.o.femalewith PMHhypothyroidism,hx of craniectomy(for frontal temporary brain meningioma resection), CKD 4, rheumatoid arthritis,DMchronic anemia, chronic pain, recent COVID infection for which she was treated to [facility] and discharged few weeks ago, aortic stenosis.  Who lives at home and lately was having some diarrhea and generalized weakness, had multiple falls at home around 3-4 and thereafter became increasingly confused.  Was brought to the ER where she was found to have low-grade fever, had toxic and metabolic encephalopathy, dehydration, neurology was consulted.  She was started on empiric treatment for meningitis and admitted to the hospital.  Further work-up revealed that she was having seizures in response to severe colitis found on CT scan and further work-up. She was started on Keppra mentation has been improving. Was on Rocephin and Flagyl for colitis and off rocephin. Patient was transferred out of Covid unit 3/17 as he was no longer needed quarantine, was beyond 21 days from initial Covid diagnosis.  Patient being managed with IV Flagyl, seen by gastroenterology.  Her hemoglobin has been downtrending, and has refused blood transfusion as she is Jehovah's Witness.  Colitis overall improving, palliative care consulted due to ongoing significant anemia.  Patient is not eating well, and is deconditioned and frail.  PT advised SNF which family has declined and they plan to take her home once she is improved.  Subjective:  Seen this morning she is alert awake interactive follows commands. Reports abdominal pain is improving. Last BM-x2 yesterday nursing reports it was not black and no blood. Hb has dropped to 6.8-6.6. she denies any shortness of breath nausea vomiting.   Afebrile overnight. On dysphagia 3 diet  Assessment & Plan:  Acute  toxic metabolic encephalopathy: Likely multifactorial in the setting of colitis, multiple comorbidities, recent Covid pneumonia.  Mildly confused but significantly better from presentation.  Daughter endorsed that patient still mildly confused. Overall much better.  Continue supportive care.   She had extensive evaluation with neurology consultation, CT head MRI brain lumbar puncture which were all unremarkable.  EEG suspicious for epileptiform focus and is placed on Keppra.  Continue supportive care increase oral intake.    CT evidence of severe colitis:Infectious versus ischemic. Improving.afebrile, WBC count stable. CRP down to 1.1. Patient has history of diverticulitis with perforation in the past with a status post small bowel and partial colectomy in 2012, last colonoscopy in 2013.  Repeat CT abdomen 3/12  "Persistent colonic wall thickening involving the distal transverse and descending colon, residual/persistent colitis. Additional colonic involvement of the more proximal distal colon has resolved over the past 6 days'. Currently on IV Flagyl and plan to cont total 14 days (started-3/6).  Appreciate GI inputs-discussed and advised to cont current plan,no e/o gi bleeding seen.Encourage oral intake.  Anemia of chronic disease:Suspect multifactorial in the setting of chronic kidney disease,chronic inflammation recent Covid, due to subcutaneous hematoma in lower abdomen.Hemoccult is negative on recent testing February.  HB.No black stool and no obvious blood seen in the bowel movement.Recenttly Hb 7-9 gm since 02/14/19, was in 9-10 in 11/2018. During this admission level fluctuating- was 6.7 3/8.She is Jehovah's Witness and has declined transfusion before even if it is lifesaving and declined today too.She has a received Aranesp and iron supplementation.We will redose Aranesp,continue iron supplementation.Iron panel 3/8 showed ferritin 2380, normal folate,B12.Hemoglobin is 6.6 g today- had extensive discussion  refusing blood transfusion, understanding  the risk with low and dropping hemoglobin-discussed with the daughter as well,Palliative care consulted. Cont PPI.Off heparin/Lovenox due to downtrending hemoglobin and abdominal hematoma.  Recent Labs  Lab 03/17/19 0408 03/18/19 0902 03/19/19 1036 03/20/19 0507 03/20/19 0822  HGB 7.4* 7.5* 7.1* 6.8* 6.6*  HCT 23.1* 23.4* 22.1* 21.3* 20.9*   Recent COVID-19 pneumonia was fully treated, out of quarantine. Overall stable/still elevated inflammatory biomarkers.  Currently saturating well on room air.  Urine Retention: Foley removed 3/9.  CT abdomen showed distended urinary bladder with mild hydronephrosis- cont bladder scan and in and out cath prn. Cont Flomax.Monitor output. Had uneasured output x1 and measured outputs 600 ml.  History of meningioma/craniotomy in the past, currently stable.  CT head with no acute finding.  Continue on Keppra  History of moderate to severe HT:[id number] follow-up w/cardio.  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  Monitor closely.  Creat has improved to 1.6.  Encouraged oral intake. Recent Labs  Lab 03/16/19 0457 03/17/19 0408 03/18/19 0902 03/19/19 0801 03/20/19 0507  BUN 24* 28* 27* 28* 24*  CREATININE 2.14* 2.06* 1.91* 7.26* 2.03*   Metabolic acidosis with bicarb at 17-20.From CKD/hypercholremia.  Mild hypokalemia -improved  Elevated D-dimer with history of DVT negative for DVT on duplex.  Chemical prophylaxis currently on hold due to dropping H&H since subcutaneous hematoma in the abdomen on 3/8.  Continue SCD.  Monitor.  This could be in the setting of her recent Covid pneumonia.  Fungal UTI received dose of Diflucan.  Hypomagnesemia treated.  Monitor.  Hypothyroidism: TSH and cortisol stable.  Cont po Synthroid.  Diabetes mellitus, type II : CBG and hba1c 6.6-stable. Monitor. Cont ssi. Recent Labs  Lab 03/19/19 0740 03/19/19 1152 03/19/19 1622 03/19/19 2053 03/20/19 0653  GLUCAP 125*  212* 113* 109* 110*   Goals of care: Patient with multiple comorbidities, recent Covid infection, CKD and other issues as above.  Overall prognosis does not appear to be bright.  Palliative care consultation requested.  Her hemoglobin is downtrending and has declined blood transfusion-understanding the risks, patient's daughter aware. Hence palliative care consult requested, daughter updated.  DVT prophylaxis:SCD. Heparin stopped on 3/8 due to continued anemia and abdominal soft tissue hematoma Code Status: Partial-no intubation Family Communication: plan of care discussed with patient at bedside.  Daughter updated 3/11 and again discussed today, palliative consulted and having family meeting today.  Disposition Plan: Patient is from:HOME-she lives w/ daughter and husband Anticipated Disposition: TBD. Barriers to discharge or conditions that needs to be met prior to discharge: Remains in the hospital for ongoing management of colitis with IV antibiotics, supportive measures, slowly responding, at this time remains in the hospital. Palliative care consulted given low hb and declining transfusion along with multiple co-morbidities, old age. Palliative care meeting with family today.   Consultants: GI  Procedures:  CT abdomen 3/12 Persistent colonic wall thickening involving the distal transverse and descending colon, residual/persistent colitis. Additional colonic involvement of the more proximal distal colon has resolved over the past 6 days. 2. Distended urinary bladder. Mild bilateral hydronephrosis is likely secondary to the degree of bladder distension. 3. Small bilateral pleural effusions with adjacent atelectasis. Additional patchy opacities at the lung bases were characterized on CT 6 days ago  CT head.  Nonacute.  EEG.  Final - This studyshowed evidence of epileptogenicity and cortical dysfunction in right frontocentral region.No seizures were seen throughout the  recording.  Lower extremity venous duplex.  No DVT.  LP - 1 wbc  CT abdomen  pelvis noncontrast- 1. Findings are concerning for severe colitis, as above. 2. Findings in the lungs which likely reflect evolving post infectious or inflammatory scarring given the patient's history of recent COVID-19 infection. 3. Aortic atherosclerosis, in addition to left main and 3 vessel coronary artery disease. 4. There are calcifications of the aortic valve. Echocardiographic correlation for evaluation of potential valvular dysfunction may be warranted if clinically indicated. 5. Additional incidental findings, as above.  MRI brain.  Nonacute.  Microbiology:see note  Medications: Scheduled Meds: . famotidine  20 mg Oral Daily  . feeding supplement  1 Container Oral TID BM  . feeding supplement (PRO-STAT SUGAR FREE 64)  30 mL Oral TID WC  . ferrous sulfate  325 mg Oral TID WC  . insulin aspart  0-9 Units Subcutaneous TID WC  . levETIRAcetam  500 mg Oral BID  . levothyroxine  150 mcg Oral Q0600  . tamsulosin  0.4 mg Oral Daily   Continuous Infusions: . metronidazole 500 mg (03/20/19 0500)    Antimicrobials: Anti-infectives (From admission, onward)   Start     Dose/Rate Route Frequency Ordered Stop   03/12/19 1200  metroNIDAZOLE (FLAGYL) IVPB 500 mg     500 mg 100 mL/hr over 60 Minutes Intravenous Every 8 hours 03/12/19 1048     03/12/19 1200  cefTRIAXone (ROCEPHIN) 2 g in sodium chloride 0.9 % 100 mL IVPB     2 g 200 mL/hr over 30 Minutes Intravenous Every 24 hours 03/12/19 1049 03/14/19 1241   03/12/19 1033  vancomycin variable dose per unstable renal function (pharmacist dosing)  Status:  Discontinued      Does not apply See admin instructions 03/12/19 1033 03/12/19 1048   03/11/19 2300  meropenem (MERREM) 1 g in sodium chloride 0.9 % 100 mL IVPB  Status:  Discontinued     1 g 200 mL/hr over 30 Minutes Intravenous Every 12 hours 03/11/19 2217 03/12/19 1048   03/11/19 1130  vancomycin  (VANCOCIN) IVPB 1000 mg/200 mL premix  Status:  Discontinued     1,000 mg 200 mL/hr over 60 Minutes Intravenous Every 48 hours 03/11/19 1032 03/12/19 1033   03/11/19 1130  fluconazole (DIFLUCAN) IVPB 100 mg     100 mg 50 mL/hr over 60 Minutes Intravenous  Once 03/11/19 1035 03/12/19 0943   03/11/19 0600  acyclovir (ZOVIRAX) 800 mg in dextrose 5 % 150 mL IVPB  Status:  Discontinued     800 mg 166 mL/hr over 60 Minutes Intravenous Every 24 hours 03/10/19 1234 03/11/19 1715   03/10/19 1600  meropenem (MERREM) 1 g in sodium chloride 0.9 % 100 mL IVPB  Status:  Discontinued     1 g 200 mL/hr over 30 Minutes Intravenous Every 12 hours 03/10/19 1247 03/11/19 2217   03/10/19 0032  vancomycin variable dose per unstable renal function (pharmacist dosing)  Status:  Discontinued      Does not apply See admin instructions 03/10/19 0032 03/11/19 1032   03/09/19 2330  acyclovir (ZOVIRAX) 800 mg in dextrose 5 % 150 mL IVPB  Status:  Discontinued     800 mg 166 mL/hr over 60 Minutes Intravenous Every 8 hours 03/09/19 2321 03/10/19 1234   03/09/19 2330  meropenem (MERREM) 1 g in sodium chloride 0.9 % 100 mL IVPB  Status:  Discontinued     1 g 200 mL/hr over 30 Minutes Intravenous Every 8 hours 03/09/19 2324 03/10/19 1247   03/09/19 2330  vancomycin (VANCOREADY) IVPB 1500 mg/300 mL  1,500 mg 150 mL/hr over 120 Minutes Intravenous  Once 03/09/19 2324 03/10/19 0601       Objective: Vitals: Today's Vitals   03/19/19 2100 03/20/19 0456 03/20/19 0924 03/20/19 1015  BP:  (!) 131/54  133/66  Pulse:  75  73  Resp:  16  18  Temp:  99 F (37.2 C)  99 F (37.2 C)  TempSrc:  Oral  Oral  SpO2:  94%  97%  Weight:      Height:      PainSc: 0-No pain  0-No pain     Intake/Output Summary (Last 24 hours) at 03/20/2019 1109 Last data filed at 03/20/2019 0600 Gross per 24 hour  Intake 740 ml  Output 720 ml  Net 20 ml   Filed Weights   03/16/19 2028 03/17/19 2118 03/19/19 2053  Weight: 85 kg 80.7 kg  80.8 kg   Weight change:    Intake/Output from previous day: 03/13 0701 - 03/14 0700 In: 980 [P.O.:880; IV Piggyback:100] Out: 720 [Urine:600; Stool:120] Intake/Output this shift: No intake/output data recorded.  Examination:  General exam: Alert awake, frail, On room air.   HEENT:Oral mucosa moist, Ear/Nose WNL grossly,dentition normal. Respiratory system: bilaterally clear, no use of accessory muscle, non tender. Cardiovascular system: S1 & S2 +, regular, No JVD. Gastrointestinal system: Abdomen soft mildly tender diffuses- bruise/ecchymosis + on lower abdomen.   Nervous System:Alert, awake, moving extremities and grossly nonfocal Extremities: No edema, distal peripheral pulses palpable.  Skin: No rashes,no icterus. MSK: Normal muscle bulk,tone, power  Data Reviewed: I have personally reviewed following labs and imaging studies CBC: Recent Labs  Lab 03/14/19 0456 03/14/19 0456 03/15/19 0448 03/15/19 0448 03/16/19 0457 03/16/19 0457 03/17/19 0408 03/18/19 0902 03/19/19 1036 03/20/19 0507 03/20/19 0822  WBC 8.4   < > 7.8   < > 8.1  --  7.7 10.1 8.9 7.9  --   NEUTROABS 5.4  --  4.6  --  5.4  --  4.5  --   --   --   --   HGB 6.7*   < > 7.1*   < > 8.4*   < > 7.4* 7.5* 7.1* 6.8* 6.6*  HCT 20.7*   < > 22.1*   < > 26.4*   < > 23.1* 23.4* 22.1* 21.3* 20.9*  MCV 88.8   < > 91.3   < > 89.8  --  90.6 91.1 91.3 91.0  --   PLT 303   < > 346   < > 387  --  360 406* 415* 397  --    < > = values in this interval not displayed.   Basic Metabolic Panel: Recent Labs  Lab 03/14/19 0456 03/14/19 0456 03/15/19 0448 03/15/19 0448 03/16/19 0457 03/17/19 0408 03/18/19 0902 03/19/19 0801 03/20/19 0507  NA 144   < > 142   < > 144 144 144 145 145  K 3.7   < > 3.9   < > 3.8 3.4* 3.7 3.9 3.4*  CL 114*   < > 111   < > 114* 114* 117* 117* 116*  CO2 21*   < > 19*   < > 20* 20* 17* 20* 20*  GLUCOSE 106*   < > 107*   < > 119* 114* 139* 136* 111*  BUN 26*   < > 24*   < > 24* 28* 27* 28*  24*  CREATININE 2.32*   < > 2.23*   < > 2.14* 2.06* 1.91* 1.80* 1.68*  CALCIUM 8.0*   < > 8.0*   < > 8.4* 8.1* 8.3* 8.1* 8.2*  MG 1.7  --  1.6*  --  2.0 1.7  --   --   --    < > = values in this interval not displayed.   GFR: Estimated Creatinine Clearance: 27.4 mL/min (A) (by C-G formula based on SCr of 1.68 mg/dL (H)). Liver Function Tests: Recent Labs  Lab 03/14/19 0456 03/15/19 0448 03/16/19 0457 03/17/19 0408  AST 13* 18 30 32  ALT 9 9 12 12   ALKPHOS 94 110 150* 134*  BILITOT 0.6 0.3 0.5 0.2*  PROT 4.6* 4.6* 5.3* 4.9*  ALBUMIN 1.4* 1.5* 1.8* 1.6*   No results for input(s): LIPASE, AMYLASE in the last 168 hours. No results for input(s): AMMONIA in the last 168 hours. Coagulation Profile: No results for input(s): INR, PROTIME in the last 168 hours. Cardiac Enzymes: No results for input(s): CKTOTAL, CKMB, CKMBINDEX, TROPONINI in the last 168 hours. BNP (last 3 results) No results for input(s): PROBNP in the last 8760 hours. HbA1C: No results for input(s): HGBA1C in the last 72 hours. CBG: Recent Labs  Lab 03/19/19 0740 03/19/19 1152 03/19/19 1622 03/19/19 2053 03/20/19 0653  GLUCAP 125* 212* 113* 109* 110*   Lipid Profile: No results for input(s): CHOL, HDL, LDLCALC, TRIG, CHOLHDL, LDLDIRECT in the last 72 hours. Thyroid Function Tests: No results for input(s): TSH, T4TOTAL, FREET4, T3FREE, THYROIDAB in the last 72 hours. Anemia Panel: No results for input(s): VITAMINB12, FOLATE, FERRITIN, TIBC, IRON, RETICCTPCT in the last 72 hours. Sepsis Labs: No results for input(s): PROCALCITON, LATICACIDVEN in the last 168 hours.  Recent Results (from the past 240 hour(s))  CSF culture     Status: None   Collection Time: 03/11/19  2:48 PM   Specimen: PATH Cytology CSF; Cerebrospinal Fluid  Result Value Ref Range Status   Specimen Description CSF  Final   Special Requests NONE  Final   Gram Stain   Final    WBC PRESENT,BOTH PMN AND MONONUCLEAR NO ORGANISMS  SEEN CYTOSPIN SMEAR    Culture   Final    NO GROWTH 3 DAYS Performed at [facility], [address], [place], [place] [address]    Report Status 03/14/2019 FINAL  Final  MRSA PCR Screening     Status: None   Collection Time: 03/12/19  9:29 AM   Specimen: Nasal Mucosa; Nasopharyngeal  Result Value Ref Range Status   MRSA by PCR NEGATIVE NEGATIVE Final    Comment:        The GeneXpert MRSA Assay (FDA approved for NASAL specimens only), is one component of a comprehensive MRSA colonization surveillance program. It is not intended to diagnose MRSA infection nor to guide or monitor treatment for MRSA infections. Performed at [facility], [place] [address], [place], [place] [address]       Radiology Studies: CT ABDOMEN PELVIS WO CONTRAST  Result Date: 03/18/2019 CLINICAL DATA:  Ongoing abdominal pain. Recent severe colitis. EXAM: CT ABDOMEN AND PELVIS WITHOUT CONTRAST TECHNIQUE: Multidetector CT imaging of the abdomen and pelvis was performed following the standard protocol without IV contrast. COMPARISON:  CT 03/12/2019 FINDINGS: Lower chest: Small pleural effusions with adjacent atelectasis. Additional patchy opacity at the lung bases which were characterized on CT 6 days ago. Trace pericardial effusion, unchanged. Hepatobiliary: Unchanged cyst in the left lobe of the liver. No new hepatic abnormality. No calcified gallstone or biliary dilatation. No pericholecystic inflammation. Pancreas: Parenchymal atrophy.  No ductal dilatation or inflammation. Spleen: Normal in size without focal abnormality. Adrenals/Urinary Tract: Normal adrenal glands. Mild bilateral hydronephrosis, right greater than left with perinephric edema. No ureteral calculi or obstruction. The urinary bladder is distended without bladder wall thickening. Probable cyst in the lower kidney again seen. Stomach/Bowel: Ingested material in the stomach. No gastric wall thickening. Few air-fluid levels within nondilated  small bowel. Enteric sutures in the cecum. Persistent colonic wall thickening involving the distal transverse and descending colon with pericolonic edema. Additional areas of colonic wall thickening involving the more proximal and distal most colon have resolved. Enteric sutures in the sigmoid. Vascular/Lymphatic: Aortic atherosclerosis. No aortic aneurysm. Calcification adjacent to the duodenum is likely a calcified lymph nodes, unchanged. No enlarged lymph nodes in the abdomen or pelvis. Reproductive: Status post hysterectomy. No adnexal masses. Other: Subcutaneous soft tissue densities in the anterior abdominal wall typical of medication injection sites. No ascites. No free air. Musculoskeletal: Multilevel degenerative change in the spine. There are no acute or suspicious osseous abnormalities. IMPRESSION: 1. Persistent colonic wall thickening involving the distal transverse and descending colon, residual/persistent colitis. Additional colonic involvement of the more proximal distal colon has resolved over the past 6 days. 2. Distended urinary bladder. Mild bilateral hydronephrosis is likely secondary to the degree of bladder distension. 3. Small bilateral pleural effusions with adjacent atelectasis. Additional patchy opacities at the lung bases were characterized on CT 6 days ago. Aortic Atherosclerosis (ICD10-I70.0). Electronically Signed   By: Keith Rake M.D.   On: 03/18/2019 16:26     LOS: 10 days   Time spent: More than 50% of that time was spent in counseling and/or coordination of care.  Antonieta Pert, MD [facility]  03/20/2019, 11:09 AM

## 2019-03-20 NOTE — Progress Notes (Signed)
CRITICAL VALUE ALERT  Critical Value:  HGB 6.7  Date & Time Notied:  03/20/2019 0605  Provider Notified: Kennon Holter NP  Orders Received/Actions taken: Awaiting response. Pt is a Jehovah's witness.

## 2019-03-21 LAB — COMPREHENSIVE METABOLIC PANEL
ALT: 11 U/L (ref 0–44)
AST: 17 U/L (ref 15–41)
Albumin: 1.7 g/dL — ABNORMAL LOW (ref 3.5–5.0)
Alkaline Phosphatase: 94 U/L (ref 38–126)
Anion gap: 8 (ref 5–15)
BUN: 23 mg/dL (ref 8–23)
CO2: 22 mmol/L (ref 22–32)
Calcium: 8.3 mg/dL — ABNORMAL LOW (ref 8.9–10.3)
Chloride: 116 mmol/L — ABNORMAL HIGH (ref 98–111)
Creatinine, Ser: 1.77 mg/dL — ABNORMAL HIGH (ref 0.44–1.00)
GFR calc Af Amer: 31 mL/min — ABNORMAL LOW (ref >60)
GFR calc non Af Amer: 27 mL/min — ABNORMAL LOW (ref >60)
Glucose, Bld: 120 mg/dL — ABNORMAL HIGH (ref 70–99)
Potassium: 3.7 mmol/L (ref 3.5–5.1)
Sodium: 146 mmol/L — ABNORMAL HIGH (ref 135–145)
Total Bilirubin: 0.3 mg/dL (ref 0.3–1.2)
Total Protein: 4.8 g/dL — ABNORMAL LOW (ref 6.5–8.1)

## 2019-03-21 LAB — CBC
HCT: 20.3 % — ABNORMAL LOW (ref 36.0–46.0)
Hemoglobin: 6.7 g/dL — CL (ref 12.0–15.0)
MCH: 29.9 pg (ref 26.0–34.0)
MCHC: 33 g/dL (ref 30.0–36.0)
MCV: 90.6 fL (ref 80.0–100.0)
Platelets: 401 10*3/uL — ABNORMAL HIGH (ref 150–400)
RBC: 2.24 MIL/uL — ABNORMAL LOW (ref 3.87–5.11)
RDW: 19.8 % — ABNORMAL HIGH (ref 11.5–15.5)
WBC: 8.2 10*3/uL (ref 4.0–10.5)
nRBC: 0.4 % — ABNORMAL HIGH (ref 0.0–0.2)

## 2019-03-21 LAB — GLUCOSE, CAPILLARY
Glucose-Capillary: 118 mg/dL — ABNORMAL HIGH (ref 70–99)
Glucose-Capillary: 170 mg/dL — ABNORMAL HIGH (ref 70–99)
Glucose-Capillary: 89 mg/dL (ref 70–99)
Glucose-Capillary: 100 mg/dL — ABNORMAL HIGH (ref 70–99)

## 2019-03-21 NOTE — Progress Notes (Signed)
PROGRESS NOTE    COLLINS DIMARIA  ALP:[id number] DOB: July 23, 1940 DOA: 03/09/2019 PCP: Jonathon Jordan, MD   Brief Narrative: As per HPI: 79 y.o.femalewith PMHhypothyroidism,hx of craniectomy(for frontal temporary brain meningioma resection), CKD 4, rheumatoid arthritis,DMchronic anemia, chronic pain, recent COVID infection for which she was treated to [facility] and discharged few weeks ago, aortic stenosis.  Who lives at home and lately was having some diarrhea and generalized weakness, had multiple falls at home around 3-4 and thereafter became increasingly confused.  Was brought to the ER where she was found to have low-grade fever, had toxic and metabolic encephalopathy, dehydration, neurology was consulted.  She was started on empiric treatment for meningitis and admitted to the hospital.  Further work-up revealed that she was having seizures in response to severe colitis found on CT scan and further work-up. She was started on Keppra mentation has been improving. Was on Rocephin and Flagyl for colitis and off rocephin. Patient was transferred out of Covid unit 3/17 as he was no longer needed quarantine, was beyond 21 days from initial Covid diagnosis.  Patient being managed with IV Flagyl, seen by gastroenterology.  Her hemoglobin has been downtrending, and has refused blood transfusion as she is Jehov[employer]'s Witness.  Colitis overall improving, palliative care consulted due to ongoing significant anemia.  Patient is not eating well, and is deconditioned and frail.  PT advised SNF which family has declined and they plan to take her home once she is improved.  Subjective: Patient reports she is feeling overall better, denies abdominal pain but has tenderness on examination. Overnight afebrile T-max 99.2, blood pressure is stable saturating well on room air Hemoglobin this morning slightly up at 6.7 g, creatinine 1.7 Blood sugar 99-152 Yesterday charted BM x2 unmeasured.   On dysphagia 3  diet  Assessment & Plan:  Acute toxic metabolic encephalopathy: Likely multifactorial in the setting of colitis, multiple comorbidities, recent Covid pneumonia.  Mildly confused but significantly better from presentation.  Daughter endorsed that patient was mildly confused.  Overall patient is following commands well she is alert awake oriented to current place, month/year, current president.Continue supportive care. She had extensive evaluation with neurology consultation, CT head MRI brain lumbar puncture which were all unremarkable.  EEG suspicious for epileptiform focus and is placed on Keppra.   CT evidence of severe colitis:Infectious versus ischemic. Improving.afebrile, WBC count stable. CRP down to 1.1. Patient has history of diverticulitis with perforation in the past with a status post small bowel and partial colectomy in 2012, last colonoscopy in 2013.  Repeat CT abdomen 3/12  "Persistent colonic wall thickening involving the distal transverse and descending colon, residual/persistent colitis. Additional colonic involvement of the more proximal distal colon has resolved over the past 6 days'. Currently on IV Flagyl and plan to cont total 14 days (started-3/6).  Appreciate GI inputs-discussed and advised to cont current plan,no e/o gi bleeding seen.Encourage oral intake.  Anemia of chronic disease:Suspect multifactorial in the setting of chronic kidney disease,chronic inflammation recent Covid, due to subcutaneous hematoma in lower abdomen.Hemoccult is negative on recent testing February.  HB.No black stool and no obvious blood seen in the bowel movement.Recenttly Hb 7-9 gm since 02/14/19, was in 9-10 in 11/2018. During this admission level fluctuating- was 6.7 3/8.She is Jehov[employer]'s Witness and has declined transfusion before even if it is lifesaving and declined today too.She has a received Aranesp and iron supplementation.s/p Aranesp 3/14, on iron supplementation.Iron panel 3/8 showed ferritin  2380(likely high from the acute phase reactant/post Covid),  iron up 48, TIBC 102,  normal folate,B12.Hemoglobin still low as below.Had extensive discussion refusing blood transfusion, understanding the risk associated. I had discussed with the daughter as well,Palliative care was consulted. Cont PPI. Avoid Heparin/Lovenox due to downtrending hemoglobin and abdominal hematoma. Monitor H&H., Recent Labs  Lab 03/18/19 0902 03/19/19 1036 03/20/19 0507 03/20/19 0822 03/21/19 0329  HGB 7.5* 7.1* 6.8* 6.6* 6.7*  HCT 23.4* 22.1* 21.3* 20.9* 20.3*   Recent COVID-19 pneumonia was fully treated, out of quarantine. Overall stable/still elevated inflammatory biomarkers.  Currently saturating well on room air.  Urine Retention: Foley removed 3/9.  CT abdomen showed distended urinary bladder with mild hydronephrosis- cont bladder scan and in and out cath prn. Cont Flomax.nursing reports patient has been voiding, bladder scan has been fairly stable and has been changed to PRN 3/14.  History of meningioma/craniotomy in the past, currently stable.  CT head with no acute finding.  Continue on Keppra  History of moderate to severe YB:WLSLHTDSKA follow-up w/cardio.  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  Monitor closely.  Creat has improved to 1.6-1.7.  Encouraged oral intake. Recent Labs  Lab 03/17/19 0408 03/18/19 0902 03/19/19 0801 03/20/19 0507 03/21/19 0329  BUN 28* 27* 28* 24* 23  CREATININE 2.06* 1.91* 1.80* 7.68* 1.15*   Metabolic acidosis with bicarb at 17-20.From CKD/hypercholremia.  Mild hypokalemia -improved  Elevated D-dimer with history of DVT negative for DVT on duplex.  Chemical prophylaxis currently on hold due to dropping H&H since subcutaneous hematoma in the abdomen on 3/8.  Continue SCD.  Monitor.  This could be in the setting of her recent Covid pneumonia.  Fungal UTI received dose of Diflucan.  Hypomagnesemia treated.  Monitor.  Hypothyroidism: TSH and cortisol stable.   Cont po Synthroid.  Diabetes mellitus, type II : CBG and hba1c 6.6-stable. Monitor. Cont ssi. Recent Labs  Lab 03/20/19 0653 03/20/19 1135 03/20/19 1638 03/20/19 2109 03/21/19 0653  GLUCAP 110* 141* 152* 99 118*   Goals of care: Patient with multiple comorbidities, recent Covid infection, CKD and other issues as above.  Overall prognosis does not appear to be bright.  Palliative care was consulted and following closely. Her hemoglobin is downtrending and has declined blood transfusion-understanding the risks, patient's daughter aware.  The report that her hemoglobin has been low in the past uncontrolled,.  Dialysis children her brother had sickle cell anemia and hemoglobin was as low as 3 g and he did okay.  Overall prognosis guarded, monitor closely.    DVT prophylaxis:SCD. Heparin stopped on 3/8 due to continued anemia and abdominal soft tissue hematoma Code Status: Partial-no intubation Family Communication: plan of care discussed with patient at bedside.  Daughter updated 3/11 and again discussed today, palliative consulted and having family meeting today.  Disposition Plan: Patient is from:HOME-she lives w/ daughter and husband Anticipated Disposition: TBD. Barriers to discharge or conditions that needs to be met prior to discharge: Remains in the hospital for ongoing management of colitis with IV antibiotics, supportive measures, monitoring of hemoglobin.  Stool for discharge given low Hb, slowly improving mentation, palliative care following.Cont to monitor Hb daily.Home once HB >7 gm.  Consultants: GI, palliative care. Procedures:  CT abdomen 3/12 Persistent colonic wall thickening involving the distal transverse and descending colon, residual/persistent colitis. Additional colonic involvement of the more proximal distal colon has resolved over the past 6 days. 2. Distended urinary bladder. Mild bilateral hydronephrosis is likely secondary to the degree of bladder  distension. 3. Small bilateral pleural effusions with adjacent  atelectasis. Additional patchy opacities at the lung bases were characterized on CT 6 days ago  CT head.  Nonacute.  EEG.  Final - This studyshowed evidence of epileptogenicity and cortical dysfunction in right frontocentral region.No seizures were seen throughout the recording.  Lower extremity venous duplex.  No DVT.  LP - 1 wbc  CT abdomen pelvis noncontrast- 1. Findings are concerning for severe colitis, as above. 2. Findings in the lungs which likely reflect evolving post infectious or inflammatory scarring given the patient's history of recent COVID-19 infection. 3. Aortic atherosclerosis, in addition to left main and 3 vessel coronary artery disease. 4. There are calcifications of the aortic valve. Echocardiographic correlation for evaluation of potential valvular dysfunction may be warranted if clinically indicated. 5. Additional incidental findings, as above.  MRI brain.  Nonacute.  Microbiology:see note  Medications: Scheduled Meds: . famotidine  20 mg Oral Daily  . feeding supplement  1 Container Oral TID BM  . feeding supplement (PRO-STAT SUGAR FREE 64)  30 mL Oral TID WC  . ferrous sulfate  325 mg Oral TID WC  . insulin aspart  0-9 Units Subcutaneous TID WC  . levETIRAcetam  500 mg Oral BID  . levothyroxine  150 mcg Oral Q0600  . tamsulosin  0.4 mg Oral Daily   Continuous Infusions: . metronidazole 500 mg (03/21/19 0500)    Antimicrobials: Anti-infectives (From admission, onward)   Start     Dose/Rate Route Frequency Ordered Stop   03/12/19 1200  metroNIDAZOLE (FLAGYL) IVPB 500 mg     500 mg 100 mL/hr over 60 Minutes Intravenous Every 8 hours 03/12/19 1048     03/12/19 1200  cefTRIAXone (ROCEPHIN) 2 g in sodium chloride 0.9 % 100 mL IVPB     2 g 200 mL/hr over 30 Minutes Intravenous Every 24 hours 03/12/19 1049 03/14/19 1241   03/12/19 1033  vancomycin variable dose per unstable renal  function (pharmacist dosing)  Status:  Discontinued      Does not apply See admin instructions 03/12/19 1033 03/12/19 1048   03/11/19 2300  meropenem (MERREM) 1 g in sodium chloride 0.9 % 100 mL IVPB  Status:  Discontinued     1 g 200 mL/hr over 30 Minutes Intravenous Every 12 hours 03/11/19 2217 03/12/19 1048   03/11/19 1130  vancomycin (VANCOCIN) IVPB 1000 mg/200 mL premix  Status:  Discontinued     1,000 mg 200 mL/hr over 60 Minutes Intravenous Every 48 hours 03/11/19 1032 03/12/19 1033   03/11/19 1130  fluconazole (DIFLUCAN) IVPB 100 mg     100 mg 50 mL/hr over 60 Minutes Intravenous  Once 03/11/19 1035 03/12/19 0943   03/11/19 0600  acyclovir (ZOVIRAX) 800 mg in dextrose 5 % 150 mL IVPB  Status:  Discontinued     800 mg 166 mL/hr over 60 Minutes Intravenous Every 24 hours 03/10/19 1234 03/11/19 1715   03/10/19 1600  meropenem (MERREM) 1 g in sodium chloride 0.9 % 100 mL IVPB  Status:  Discontinued     1 g 200 mL/hr over 30 Minutes Intravenous Every 12 hours 03/10/19 1247 03/11/19 2217   03/10/19 0032  vancomycin variable dose per unstable renal function (pharmacist dosing)  Status:  Discontinued      Does not apply See admin instructions 03/10/19 0032 03/11/19 1032   03/09/19 2330  acyclovir (ZOVIRAX) 800 mg in dextrose 5 % 150 mL IVPB  Status:  Discontinued     800 mg 166 mL/hr over 60 Minutes Intravenous Every 8 hours  03/09/19 2321 03/10/19 1234   03/09/19 2330  meropenem (MERREM) 1 g in sodium chloride 0.9 % 100 mL IVPB  Status:  Discontinued     1 g 200 mL/hr over 30 Minutes Intravenous Every 8 hours 03/09/19 2324 03/10/19 1247   03/09/19 2330  vancomycin (VANCOREADY) IVPB 1500 mg/300 mL     1,500 mg 150 mL/hr over 120 Minutes Intravenous  Once 03/09/19 2324 03/10/19 0601       Objective: Vitals: Today's Vitals   03/20/19 1015 03/20/19 1928 03/20/19 2110 03/21/19 0515  BP: 133/66  (!) 135/57 (!) 134/91  Pulse: 73  73 75  Resp: _0 Temp: 99 F (37.2 C)  99.2 F  (37.3 C) 98.9 F (37.2 C)  TempSrc: Oral  Oral Oral  SpO2: 97%  100% 97%  Weight:   79.8 kg   Height:      PainSc:  0-No pain      Intake/Output Summary (Last 24 hours) at 03/21/2019 0753 Last data filed at 03/21/2019 0547 Gross per 24 hour  Intake 240 ml  Output 500 ml  Net -260 ml   Filed Weights   03/17/19 2118 03/19/19 2053 03/20/19 2110  Weight: 80.7 kg 80.8 kg 79.8 kg   Weight change: -0.967 kg   Intake/Output from previous day: 03/14 0701 - 03/15 0700 In: 240 [P.O.:240] Out: 500 [Urine:500] Intake/Output this shift: No intake/output data recorded.  Examination:  General exam: Alert awake, frail, On room air.   HEENT:Oral mucosa moist, Ear/Nose WNL grossly,dentition normal. Respiratory system: bilaterally clear, no use of accessory muscle, non tender. Cardiovascular system: S1 & S2 +, regular, No JVD. Gastrointestinal system: Abdomen soft mildly tender diffuses- bruise/ecchymosis + on lower abdomen.   Nervous System:Alert, awake, moving extremities and grossly nonfocal Extremities: No edema, distal peripheral pulses palpable.  Skin: No rashes,no icterus. MSK: Normal muscle bulk,tone, power  Data Reviewed: I have personally reviewed following labs and imaging studies CBC: Recent Labs  Lab 03/15/19 0448 03/15/19 0448 03/16/19 0457 03/16/19 0457 03/17/19 0408 03/17/19 0408 03/18/19 0902 03/19/19 1036 03/20/19 0507 03/20/19 0822 03/21/19 0329  WBC 7.8   < > 8.1   < > 7.7  --  10.1 8.9 7.9  --  8.2  NEUTROABS 4.6  --  5.4  --  4.5  --   --   --   --   --   --   HGB 7.1*   < > 8.4*   < > 7.4*   < > 7.5* 7.1* 6.8* 6.6* 6.7*  HCT 22.1*   < > 26.4*   < > 23.1*   < > 23.4* 22.1* 21.3* 20.9* 20.3*  MCV 91.3   < > 89.8   < > 90.6  --  91.1 91.3 91.0  --  90.6  PLT 346   < > 387   < > 360  --  406* 415* 397  --  401*   < > = values in this interval not displayed.   Basic Metabolic Panel: Recent Labs  Lab 03/15/19 0448 03/15/19 0448 03/16/19 0457  03/16/19 0457 03/17/19 0408 03/18/19 0902 03/19/19 0801 03/20/19 0507 03/21/19 0329  NA 142   < > 144   < > 144 144 145 145 146*  K 3.9   < > 3.8   < > 3.4* 3.7 3.9 3.4* 3.7  CL 111   < > 114*   < > 114* 117* 117* 116* 116*  CO2 19*   < >  20*   < > 20* 17* 20* 20* 22  GLUCOSE 107*   < > 119*   < > 114* 139* 136* 111* 120*  BUN 24*   < > 24*   < > 28* 27* 28* 24* 23  CREATININE 2.23*   < > 2.14*   < > 2.06* 1.91* 1.80* 1.68* 1.77*  CALCIUM 8.0*   < > 8.4*   < > 8.1* 8.3* 8.1* 8.2* 8.3*  MG 1.6*  --  2.0  --  1.7  --   --   --   --    < > = values in this interval not displayed.   GFR: Estimated Creatinine Clearance: 25.9 mL/min (A) (by C-G formula based on SCr of 1.77 mg/dL (H)). Liver Function Tests: Recent Labs  Lab 03/15/19 0448 03/16/19 0457 03/17/19 0408 03/21/19 0329  AST 18 30 32 17  ALT _0 ALKPHOS 110 150* 134* 94  BILITOT 0.3 0.5 0.2* 0.3  PROT 4.6* 5.3* 4.9* 4.8*  ALBUMIN 1.5* 1.8* 1.6* 1.7*   No results for input(s): LIPASE, AMYLASE in the last 168 hours. No results for input(s): AMMONIA in the last 168 hours. Coagulation Profile: No results for input(s): INR, PROTIME in the last 168 hours. Cardiac Enzymes: No results for input(s): CKTOTAL, CKMB, CKMBINDEX, TROPONINI in the last 168 hours. BNP (last 3 results) No results for input(s): PROBNP in the last 8760 hours. HbA1C: No results for input(s): HGBA1C in the last 72 hours. CBG: Recent Labs  Lab 03/20/19 0653 03/20/19 1135 03/20/19 1638 03/20/19 2109 03/21/19 0653  GLUCAP 110* 141* 152* 99 118*   Lipid Profile: No results for input(s): CHOL, HDL, LDLCALC, TRIG, CHOLHDL, LDLDIRECT in the last 72 hours. Thyroid Function Tests: No results for input(s): TSH, T4TOTAL, FREET4, T3FREE, THYROIDAB in the last 72 hours. Anemia Panel: No results for input(s): VITAMINB12, FOLATE, FERRITIN, TIBC, IRON, RETICCTPCT in the last 72 hours. Sepsis Labs: No results for input(s): PROCALCITON, LATICACIDVEN  in the last 168 hours.  Recent Results (from the past 240 hour(s))  CSF culture     Status: None   Collection Time: 03/11/19  2:48 PM   Specimen: PATH Cytology CSF; Cerebrospinal Fluid  Result Value Ref Range Status   Specimen Description CSF  Final   Special Requests NONE  Final   Gram Stain   Final    WBC PRESENT,BOTH PMN AND MONONUCLEAR NO ORGANISMS SEEN CYTOSPIN SMEAR    Culture   Final    NO GROWTH 3 DAYS Performed at [facility], [address] [place], [place] [address]    Report Status 03/14/2019 FINAL  Final  MRSA PCR Screening     Status: None   Collection Time: 03/12/19  9:29 AM   Specimen: Nasal Mucosa; Nasopharyngeal  Result Value Ref Range Status   MRSA by PCR NEGATIVE NEGATIVE Final    Comment:        The GeneXpert MRSA Assay (FDA approved for NASAL specimens only), is one component of a comprehensive MRSA colonization surveillance program. It is not intended to diagnose MRSA infection nor to guide or monitor treatment for MRSA infections. Performed at [facility], [place] [address], [place], [place] [address]       Radiology Studies: No results found.   LOS: 11 days   Time spent: More than 50% of that time was spent in counseling and/or coordination of care.  Antonieta Pert, MD [facility]  03/21/2019, 7:53 AM

## 2019-03-22 ENCOUNTER — Telehealth: Payer: Self-pay | Admitting: Rheumatology

## 2019-03-22 LAB — CBC
HCT: 24.6 % — ABNORMAL LOW (ref 36.0–46.0)
Hemoglobin: 7.8 g/dL — ABNORMAL LOW (ref 12.0–15.0)
MCH: 29.4 pg (ref 26.0–34.0)
MCHC: 31.7 g/dL (ref 30.0–36.0)
MCV: 92.8 fL (ref 80.0–100.0)
Platelets: 437 10*3/uL — ABNORMAL HIGH (ref 150–400)
RBC: 2.65 MIL/uL — ABNORMAL LOW (ref 3.87–5.11)
RDW: 20.3 % — ABNORMAL HIGH (ref 11.5–15.5)
WBC: 8.8 10*3/uL (ref 4.0–10.5)
nRBC: 0.2 % (ref 0.0–0.2)

## 2019-03-22 LAB — GLUCOSE, CAPILLARY: Glucose-Capillary: 119 mg/dL — ABNORMAL HIGH (ref 70–99)

## 2019-03-22 MED ORDER — METRONIDAZOLE 500 MG PO TABS: 500.0000 mg | ORAL_TABLET | Freq: Three times a day (TID) | ORAL | 0 refills | Status: AC

## 2019-03-22 MED ORDER — FERROUS SULFATE 325 (65 FE) MG PO TABS: 325.0000 mg | ORAL_TABLET | Freq: Three times a day (TID) | ORAL | 0 refills | Status: AC

## 2019-03-22 MED ORDER — TAMSULOSIN HCL 0.4 MG PO CAPS: 0.4000 mg | ORAL_CAPSULE | Freq: Every day | ORAL | 0 refills | Status: AC

## 2019-03-22 MED ORDER — LEVETIRACETAM 100 MG/ML PO SOLN: 500.0000 mg | Freq: Two times a day (BID) | ORAL | 1 refills | Status: AC

## 2019-03-22 NOTE — Progress Notes (Signed)
Physical Therapy Treatment Patient Details Name: [name] MRN: [id number] DOB: March 01, 1940 Today's Date: 03/22/2019    History of Present Illness Pt is is an 79 y.o. 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 Daughter reports jerking movements of pt's legs and odd chewing motions prior to admit. No szs found on EEG. Pt had multiple falls prior to admit and found to have toxic & metabolic encephalopathy.    PT Comments    Pt eager to d/c home today. Pt continues to require significant physical assist to mobilize at this time. When asked, pt states her husband and daughter will help her mobilize, although pt's husband is w/c level per OT and RN. Pt continues to demonstrate lack of insight into deficits. PT encouraged pt to mobilize with HHPT and OT upon d/c, pt states she is motivated to do so. Pt to d/c today.    Follow Up Recommendations  SNF;Supervision/Assistance - 24 hour     Equipment Recommendations  None recommended by PT    Recommendations for Other Services       Precautions / Restrictions Precautions Precautions: Fall Restrictions Weight Bearing Restrictions: No    Mobility  Bed Mobility Overal bed mobility: Needs Assistance Bed Mobility: Supine to Sit     Supine to sit: HOB elevated;Mod assist     General bed mobility comments: mod assist for LE progression, trunk elevation. Use of bedrails, HOB elevation, and increased time/effort  Transfers Overall transfer level: Needs assistance Equipment used: Rolling walker (2 wheeled) Transfers: Sit to/from [facility] Sit to Stand: +2 safety/equipment;Mod assist;From elevated surface Stand pivot transfers: Mod assist;+2 safety/equipment       General transfer comment:  Mod +2 for power up, hip extension to come to standing, verbal and tactile cuing for bringing pt feet within BOS as pt tends to keep LEs too anterior. Mod +2 for stand pivot to recliner to steadying, guiding pt/RW to chair.  Ambulation/Gait             General Gait Details: unable   Stairs             Wheelchair Mobility    Modified Rankin (Stroke Patients Only)       Balance Overall balance assessment: Needs assistance Sitting-balance support: Feet supported;Bilateral upper extremity supported Sitting balance-Leahy Scale: Fair Sitting balance - Comments: able to sit EOB without PT support, posterior leaning with scooting Postural control: Posterior lean Standing balance support: Bilateral upper extremity supported;During functional activity Standing balance-Leahy Scale: Poor Standing balance comment: leans forward heavily                            Cognition Arousal/Alertness: Awake/alert Behavior During Therapy: WFL for tasks assessed/performed Overall Cognitive Status: Impaired/Different from baseline Area of Impairment: Problem solving;Awareness;Safety/judgement;Following commands;Attention;Memory                   Current Attention Level: Sustained Memory: Decreased short-term memory Following Commands: Follows one step commands inconsistently;Follows one step commands with increased time Safety/Judgement: Decreased awareness of deficits;Decreased awareness of safety Awareness: Intellectual Problem Solving: Slow processing;Decreased initiation;Difficulty sequencing;Requires verbal cues;Requires tactile cues General Comments: Pt lacking understanding of deficits as it relates to carryover into function at home. Increased time to respond to verbal  cuing, and often times repeated verbal cuing/rephrasing to follow commands.      Exercises General Exercises - Lower Extremity Long Arc Quad: AAROM;5 reps;Seated    General Comments         Pertinent Vitals/Pain Pain Assessment: Faces Faces Pain Scale: Hurts little more Pain Location: knees with mobility Pain Descriptors / Indicators: Aching;Grimacing;Guarding Pain Intervention(s): Limited activity within patient's tolerance;Monitored during session;Repositioned    Home Living                      Prior Function            PT Goals (current goals can now be found in the care plan section) Acute Rehab PT Goals Patient Stated Goal: to go home PT Goal Formulation: With patient Time For Goal Achievement: 03/26/19 Potential to Achieve Goals: Fair Progress towards PT goals: Progressing toward goals    Frequency    Min 2X/week      PT Plan Current plan remains appropriate    Co-evaluation PT/OT/SLP Co-Evaluation/Treatment: Yes Reason for Co-Treatment: For patient/therapist safety;To address functional/ADL transfers PT goals addressed during session: Mobility/safety with mobility OT goals addressed during session: ADL's and self-care;Proper use of Adaptive equipment and DME      AM-PAC PT "6 Clicks" Mobility   Outcome Measure  Help needed turning from your back to your side while in a flat bed without using bedrails?: A Lot Help needed moving from lying on your back to sitting on the side of a flat bed without using bedrails?: A Lot Help needed moving to and from a bed to a chair (including a wheelchair)?: Total Help needed standing up from a chair using your arms (e.g., wheelchair or bedside chair)?: Total Help needed to walk in hospital room?: Total Help needed climbing 3-5 steps with a railing? : Total 6 Click Score: 8    End of Session Equipment Utilized During Treatment: Gait belt Activity Tolerance: Patient limited by fatigue Patient left: in bed;with call bell/phone within reach;with bed alarm set Nurse Communication: Mobility status PT Visit Diagnosis: Unsteadiness on feet (R26.81);Other abnormalities of gait and mobility  (R26.89);Muscle weakness (generalized) (M62.81);History of falling (Z91.81)     Time: 1610-9604 PT Time Calculation (min) (ACUTE ONLY): 28 min  Charges:  $Therapeutic Activity: 8-22 mins                    Makaylin Carlo E, PT Acute Rehabilitation Services Pager [phone]  Office [phone]   Natsuko Kelsay D Elonda Husky 03/22/2019, 12:54 PM

## 2019-03-22 NOTE — Progress Notes (Signed)
[name] to be discharged Home per MD order. Discussed prescriptions and follow up appointments with the patient. Prescriptions given to patient; medication list explained in detail. Patient verbalized understanding.  Skin clean, dry and intact without evidence of skin break down, no evidence of skin tears noted. IV catheter discontinued intact. Site without signs and symptoms of complications. Dressing and pressure applied. Pt denies pain at the site currently. No complaints noted.  Patient free of lines, drains, and wounds.   An After Visit Summary (AVS) was printed and given to the patient. Patient escorted via wheelchair, and discharged home via private auto.  Shela Commons, RN

## 2019-03-22 NOTE — TOC Transition Note (Addendum)
Transition of Care [facility]) - CM/SW Discharge Note   Patient Details  Name: [name] MRN: [id number] Date of Birth: 1940-04-26  Transition of Care [facility]) CM/SW Contact:  Carles Collet, RN Phone Number: 03/22/2019, 10:09 AM   Clinical Narrative:   [name] w patient's daughter [name], who is agreeable to DC today w [facility] services resuming through [place], added OT to order. She declines DME at this time. She wants to get the patient home and see how she does before she gets large equipment like a bed or hoyer. We discussed how to obtain these things after DC. Confirmed that she has a WC at home already. [name] states that she will provide transportation home.     Final next level of care: [place] Barriers to Discharge: No Barriers Identified   Patient Goals and CMS Choice Patient states their goals for this hospitalization and ongoing recovery are:: to go home CMS Medicare.gov Compare Post Acute Care list provided to:: Other (Comment Required) Choice offered to / list presented to : Adult Children  Discharge Placement                       Discharge Plan and Services In-house Referral: Clinical Social Work   Post Acute Care Choice: (SNF vs. [place])                    HH Arranged: PT, OT, Social Work [employer] Agency: [place] Date [place]: 03/22/19 Time [place]: 1009 Representative spoke with at [place]: [place] ([place]) Interventions     Readmission Risk Interventions No flowsheet data found.

## 2019-03-22 NOTE — Progress Notes (Incomplete)
Patient ID: [name], female   DOB: 08-Jan-1940, 79 y.o.   MRN: [id number] ? ?This NP visited patient at the bedside as a follow up to  yesterday's [place]. ? ?Patient is alert and oriented and expressing her gratitude for her ? ? ? ? ? ?Discussed with patient the importance of continued conversation with family and their  medical providers regarding overall plan of care and treatment options,  ensuring decisions are within the context of the patients values and GOCs. ? ?Questions and concerns addressed   Discussed with Dr ? ?Total time spent on the unit was  ? ?Greater than 50% of the time was spent in counseling and coordination of care ? ?Wadie Lessen NP  ?Palliative Medicine Team Team Phone # [phone] ?Pager [phone] ? ? ?

## 2019-03-22 NOTE — Progress Notes (Signed)
Occupational Therapy Treatment Patient Details Name: [name] MRN: [id number] DOB: 01-19-1940 Today's Date: 03/22/2019    History of present illness Pt is is an 79 y.o. 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 Daughter reports jerking movements of pt's legs and odd chewing motions prior to admit. No szs found on EEG. Pt had multiple falls prior to admit and found to have toxic & metabolic encephalopathy.   OT comments  Pt making progress with functional goals. Pt participated in bed mobility to sit EOB, selfcare sitting EOB, sit - stand from EOB to RW for SPT to recliner. Pt's family to take her home this afternoon.   Follow Up Recommendations  SNF(pt/family refusing SNF)    Equipment Recommendations  Hospital bed;Wheelchair (measurements OT);Wheelchair cushion (measurements OT)    Recommendations for Other Services      Precautions / Restrictions Precautions Precautions: Fall Restrictions Weight Bearing Restrictions: No       Mobility Bed Mobility Overal bed mobility: Needs Assistance Bed Mobility: Supine to Sit     Supine to sit: Max assist;HOB elevated     General bed mobility comments: increased time and effort, used rails, required verbal and tactile cues  Transfers Overall transfer level: Needs assistance Equipment used: Rolling walker (2 wheeled) Transfers: Sit to/from [facility] Sit to Stand: +2 safety/equipment;Mod assist;From elevated surface Stand pivot transfers: Mod assist;+2 safety/equipment       General transfer comment: pt heavily leaning forward    Balance Overall balance assessment: Needs assistance Sitting-balance support: Feet supported;Bilateral upper extremity supported Sitting balance-Leahy  Scale: Fair     Standing balance support: Bilateral upper extremity supported;During functional activity Standing balance-Leahy Scale: Poor Standing balance comment: leans forward heavily                           ADL either performed or assessed with clinical judgement   ADL Overall ADL's : Needs assistance/impaired Eating/Feeding: Set up;Independent;Sitting   Grooming: Wash/dry hands;Wash/dry face;Min guard;Sitting   Upper Body Bathing: Min guard;Sitting Upper Body Bathing Details (indicate cue type and reason): simulated Lower Body Bathing: Maximal assistance;Sitting/lateral leans Lower Body Bathing Details (indicate cue type and reason): simulated Upper Body Dressing : Min guard;Sitting       Toilet Transfer: Moderate assistance;+2 for physical assistance;Stand-pivot;RW Toilet Transfer Details (indicate cue type and reason): simulated to recliner Toileting- Clothing Manipulation and Hygiene: Maximal assistance;Sit to/from stand         General ADL Comments: sat EOB x 10 min     Vision Baseline Vision/History: Wears glasses Wears Glasses: Reading only     Perception     Praxis      Cognition Arousal/Alertness: Awake/alert Behavior During Therapy: WFL for tasks assessed/performed Overall Cognitive Status: Impaired/Different from baseline Area of Impairment: Problem solving;Awareness;Safety/judgement;Following commands;Attention                     Memory: Decreased recall of precautions;Decreased short-term memory Following Commands: Follows one step commands inconsistently;Follows one step commands with increased time     Problem Solving: Slow processing;Decreased initiation;Difficulty sequencing;Requires verbal cues;Requires tactile cues          Exercises     Shoulder Instructions       General Comments  Pertinent Vitals/ Pain       Pain Assessment: Faces Faces Pain Scale: Hurts little more Pain Location: knees with  mobility Pain Descriptors / Indicators: Aching;Grimacing;Guarding Pain Intervention(s): Limited activity within patient's tolerance;Monitored during session;Repositioned  Home Living                                          Prior Functioning/Environment              Frequency  Min 2X/week        Progress Toward Goals  OT Goals(current goals can now be found in the care plan section)  Progress towards OT goals: Progressing toward goals  Acute Rehab OT Goals Patient Stated Goal: to go home  Plan Discharge plan remains appropriate    Co-evaluation    PT/OT/SLP Co-Evaluation/Treatment: Yes Reason for Co-Treatment: Necessary to address cognition/behavior during functional activity;For patient/therapist safety;To address functional/ADL transfers   OT goals addressed during session: ADL's and self-care;Proper use of Adaptive equipment and DME      AM-PAC OT "6 Clicks" Daily Activity     Outcome Measure   Help from another person eating meals?: None Help from another person taking care of personal grooming?: A Little Help from another person toileting, which includes using toliet, bedpan, or urinal?: Total Help from another person bathing (including washing, rinsing, drying)?: A Lot Help from another person to put on and taking off regular upper body clothing?: A Little Help from another person to put on and taking off regular lower body clothing?: Total 6 Click Score: 14    End of Session Equipment Utilized During Treatment: Gait belt;Rolling walker  OT Visit Diagnosis: Unsteadiness on feet (R26.81);Muscle weakness (generalized) (M62.81);Other symptoms and signs involving cognitive function   Activity Tolerance Patient tolerated treatment well   Patient Left with call bell/phone within reach;in chair;with chair alarm set   Nurse Communication          Time: [phone] OT Time Calculation (min): 27 min  Charges: OT General Charges $OT  Visit: 1 Visit OT Treatments $Self Care/Home Management : 8-22 mins     Britt Bottom 03/22/2019, 12:30 PM

## 2019-03-22 NOTE — Discharge Summary (Signed)
Physician Discharge Summary  [name] [facility]:[id number] DOB: April 11, 1940 DOA: 03/09/2019  PCP: Jonathon Jordan, MD  Admit date: 03/09/2019 Discharge date: 03/22/2019  Admitted From: home Disposition:  [place]  Recommendations for Outpatient Follow-up:  Follow up with PCP in 1-2 weeks Please obtain BMP/CBC in one week Please follow up on the following pending results:  Home Health:YES  Equipment/Devices: HOSP BED  Discharge Condition: Stable Code Status: DNR Diet recommendation:Dysphagia diet  Brief/Interim Summary:  79 y.o. 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  Who lives at home and lately was having some diarrhea and generalized weakness, had multiple falls at home around 3-4 and thereafter became increasingly confused.  Was brought to the ER where she was found to have low-grade fever, had toxic and metabolic encephalopathy, dehydration, neurology was consulted.  She was started on empiric treatment for meningitis and admitted to the hospital.   Further work-up revealed that she was having seizures in response to severe colitis found on CT scan and further work-up. She was started on Keppra mentation has been improving. Was on Rocephin and Flagyl for colitis and off rocephin. Patient was transferred out of Covid unit 3/17 as he was no longer needed quarantine, was beyond 21 days from initial Covid diagnosis.   Patient was managed with IV Flagyl, seen by gastroenterology.  Her hemoglobin has been downtrending, and has refused blood transfusion as she is Jehov[employer]'s Witness.  Colitis overall improving, palliative care consulted due to ongoing significant anemia.  PT advised SNF which family has declined and they plan to take her home.  Patient hemoglobin drifted down  to 6.6 g few times and subsequently has stabilized and last HB 7.8 g 3.16/21.She is on iron supplementation and also got Aranesp.Overall medically improved. Cussed the plan of care with daughter agreement for discharge today, instructed to check CBC in 5 to 7 days  and given her multiple comorbidities, he is at risk for readmission. Advised palliative care f/u in community/pcp follow up  Discharge Diagnoses:  Acute toxic metabolic encephalopathy: Likely multifactorial in the setting of colitis, multiple comorbidities, recent Covid pneumonia.  Mildly confused but significantly better from presentation.  Daughter endorsed that patient was mildly confused.  Overall patient is following commands well she is alert awake oriented to current place, month/year, current president.Continue supportive care. She had extensive evaluation with neurology consultation, CT head MRI brain lumbar puncture which were all unremarkable.  EEG suspicious for epileptiform focus and is placed on Keppra.    CT evidence of severe colitis:Infectious versus ischemic. Improving.afebrile, WBC count stable. CRP down to 1.1. Patient has history of diverticulitis with perforation in the past with a status post small bowel and partial colectomy in 2012, last colonoscopy in 2013.  Repeat CT abdomen 3/12  "Persistent colonic wall thickening involving the distal transverse and descending colon, residual/persistent colitis. Additional colonic involvement of the more proximal distal colon has resolved over the past 6 days'. Currently on IV Flagyl and completing 14 days course started 3/6. Appreciate GI inputs-and GI signed off.  Encourage oral intake.     Anemia of chronic disease:Suspect multifactorial in the setting of chronic kidney disease,chronic inflammation recent Covid, due to subcutaneous hematoma in lower abdomen.Hemoccult is negative on recent testing February.  HB.No black stool and no obvious blood seen in the bowel movement.Recenttly  Hb 7-9 gm since 02/14/19, was in 9-10 in 11/2018. During this admission level fluctuating- was 6.7 3/8.She is Jehov[employer]'s Witness and has declined transfusion before even if it is lifesaving and declined today too.She has a received Aranesp and iron supplementation.s/p Aranesp 3/14, on iron supplementation.Iron panel 3/8 showed ferritin 2380(likely high from the acute phase reactant/post Covid), iron up 48, TIBC 102,  normal folate,B12.Had extensive discussion refusing blood transfusion, understanding the risk associated. I had discussed with the daughter as well,Palliative care was consulted. Cont PPI. Avoid Heparin/Lovenox due to downtrending hemoglobin and abdominal hematoma. Hb has stabilized at this time, no active bleeding noted- f/u with PCP with CBC check in 1 week Recent Labs  Lab 03/19/19 1036 03/20/19 0507 03/20/19 0822 03/21/19 0329 03/22/19 0355  HGB 7.1* 6.8* 6.6* 6.7* 7.8*  HCT 22.1* 21.3* 20.9* 20.3* 24.6*    Recent COVID-19 pneumonia was fully treated, out of quarantine. Overall stable/still elevated inflammatory biomarkers.  Currently saturating well on room air.   Urine Retention: Foley removed 3/9.  CT abdomen showed distended urinary bladder with mild hydronephrosis- cont bladder scan and in and out cath prn. Cont Flomax.nursing reports patient has been voiding, bladder scan has been fairly stable and has been changed to PRN 3/14.   History of meningioma/craniotomy in the past, currently stable.  CT head with no acute finding.  Continue on Keppra   History of moderate to severe ZJ:[id number] follow-up w/cardio.   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  Monitor closely.  Creat has improved to 1.6-1.7.  Encouraged oral intake.  Metabolic acidosis with bicarb at 17-20.From CKD/hypercholremia.   Mild hypokalemia -improved   Elevated D-dimer with history of DVT negative for DVT on duplex.  Chemical prophylaxis currently on hold due to dropping H&H since subcutaneous hematoma  in the abdomen on 3/8.  Continue SCD.  Monitor.  This could be in the setting of her recent Covid pneumonia.   Fungal UTI received dose of Diflucan.   Hypomagnesemia treated.  Monitor.   Hypothyroidism: TSH and cortisol stable.  Cont po Synthroid.   Diabetes mellitus, type II : CBG and hba1c 6.6-stable. Monitor. CONT HOME MEDS   Goals of care: Patient with multiple comorbidities, recent Covid infection, CKD and other issues as above.  Overall prognosis does not appear to be bright.  Palliative care was consulted and following closely. Her hemoglobin is downtrending and has declined blood transfusion-understanding the risks, patient's daughter aware.  The report that her hemoglobin has been low in the past uncontrolled,.  Dialysis children her brother had sickle cell anemia and hemoglobin was as low as 3 g and he did okay.  Overall prognosis guarded, monitor closely.    Patient is from:HOME-she lives w/ daughter and husband Anticipated Disposition: HHC Barriers to discharge or conditions that needs to be met prior to discharge: Stabilized at this time no further need of inpatient hospitalization and is being discharged home with home health [place] and social worker   Consultants: GI, palliative care. Procedures:   CT abdomen 3/12 Persistent colonic wall thickening involving the distal transverse and descending colon, residual/persistent colitis. Additional colonic involvement of the more proximal distal colon has resolved over the past 6 days. 2. Distended urinary bladder. Mild bilateral hydronephrosis is likely secondary to the degree of bladder distension. 3. Small bilateral pleural effusions with adjacent atelectasis. Additional patchy opacities at the lung bases were characterized on CT 6 days ago   CT head.  Nonacute.   EEG.  Final - This study showed evidence of epileptogenicity and cortical dysfunction in right frontocentral region. No seizures were seen  throughout the recording.   Lower extremity venous duplex.  No DVT.   LP - 1 wbc   CT abdomen pelvis noncontrast- 1. Findings are concerning for severe colitis, as above. 2. Findings in the lungs which likely reflect evolving post infectious or inflammatory scarring given the patient's history of recent COVID-19 infection. 3. Aortic atherosclerosis, in addition to left main and 3 vessel coronary artery disease. 4. There are calcifications of the aortic valve. Echocardiographic correlation for evaluation of potential valvular dysfunction may be warranted if clinically indicated. 5. Additional incidental findings, as above.   MRI brain.  Nonacute.   Subjective: Sting comfortably this morning denies any abdominal pain.  Had a bowel movement last night.  HB has gone up to mid 7 g. Denies nausea vomiting. Encourage her to eat while I was there she finished half of the banana.  Discharge Exam: Vitals:   03/22/19 0454 03/22/19 0822  BP: 140/65 (!) 149/68  Pulse: 66 77  Resp: 18 17  Temp: 98.5 F (36.9 C) 98.4 F (36.9 C)  SpO2: 96% 100%   General: Pt is alert, awake, not in acute distress Cardiovascular: RRR, S1/S2 +, no rubs, no gallops Respiratory: CTA bilaterally, no wheezing, no rhonchi Abdominal: Soft, NT, ND, bowel sounds + Extremities: no edema, no cyanosis  Discharge Instructions  Discharge Instructions     Diet - low sodium heart healthy   Complete by: As directed    Discharge instructions   Complete by: As directed    Please call call MD or return to ER for similar or worsening recurring problem that brought you to hospital or if any fever,nausea/vomiting,abdominal pain, uncontrolled pain, chest pain,  shortness of breath or any other alarming symptoms.  Please check your BMP, CBC in 5-7 days  Please follow-up your doctor as instructed in a week time and call the office for appointment.  Please avoid alcohol, smoking, or any other illicit substance and maintain  healthy habits including taking your regular medications as prescribed.  You were cared for by a hospitalist during your hospital stay. If you have any questions about your discharge medications or the care you received while you were in the hospital after you are discharged, you can call the unit and ask to speak with the hospitalist on call if the hospitalist that took care of you is not available.  Once you are discharged, your primary care physician will handle any further medical issues. Please note that NO REFILLS for any discharge medications will be authorized once you are discharged, as it is imperative that you return to your primary care physician (or establish a relationship with a primary care physician if you do not have one) for your aftercare needs so that they can reassess your need for medications and monitor your lab values   Increase activity slowly   Complete by: As directed       Allergies as of 03/22/2019       Reactions   Methotrexate Derivatives Other (See Comments)   Elevated creat. Levels   Actemra [tocilizumab] Rash   Other Other (See Comments)   NO Blood products.  [name]- causes low blood sugar (pt states she was taking medication but cant remember what it was for.)   Penicillins Rash   Has patient had a PCN reaction causing immediate rash, facial/tongue/throat swelling, SOB or lightheadedness with hypotension: Yes Has patient  had a PCN reaction causing severe rash involving mucus membranes or skin necrosis: Yes  Has patient had a PCN reaction that required hospitalization No Has patient had a PCN reaction occurring within the last 10 years: No If all of the above answers are "NO", then may proceed with Cephalosporin use.   Chlorhexidine    Remicade [infliximab] Other (See Comments)   Inadequate response   Zocor [simvastatin] Other (See Comments)   Leg cramps        Medication List     STOP taking these medications    aspirin 81 MG EC tablet    Back & Body Extra Strength 500-32.5 MG Tabs Generic drug: Aspirin-Caffeine   ferrous sulfate 75 (15 Fe) MG/ML Soln Commonly known as: FER-IN-SOL Replaced by: ferrous sulfate 325 (65 FE) MG tablet       TAKE these medications    abatacept 750 mg in sodium chloride 0.9 % 70 mL Inject 750 mg into the vein every 28 (twenty-eight) days.   acetaminophen 500 MG tablet Commonly known as: TYLENOL Take 1,000 mg by mouth every 6 (six) hours as needed for mild pain.   allopurinol 300 MG tablet Commonly known as: ZYLOPRIM Take 1 tablet (300 mg total) by mouth daily.   atorvastatin 80 MG tablet Commonly known as: LIPITOR Take 80 mg by mouth daily.   BD Insulin Syringe Ultrafine 31G X 15/64" 0.3 ML Misc Generic drug: Insulin Syringe-Needle U-100   CALCIUM 600-D PO Take 1 tablet by mouth daily.   clotrimazole-betamethasone cream Commonly known as: LOTRISONE Apply 1 application topically daily as needed (rash/irritation).   cyclobenzaprine 10 MG tablet Commonly known as: FLEXERIL Take 10 mg by mouth 3 (three) times daily as needed for muscle spasms.   ferrous sulfate 325 (65 FE) MG tablet Take 1 tablet (325 mg total) by mouth 3 (three) times daily with meals. Replaces: ferrous sulfate 75 (15 Fe) MG/ML Soln   FISH OIL PO Take 1 capsule by mouth daily.   HYDROcodone-acetaminophen 5-325 MG tablet Commonly known as: Norco Take 1 tablet by mouth every 6 (six) hours as needed for moderate pain. What changed: how much to take   leflunomide 10 MG tablet Commonly known as: ARAVA Take 1 tablet (10 mg total) by mouth daily.   levETIRAcetam 100 MG/ML solution Commonly known as: KEPPRA Take 5 mLs (500 mg total) by mouth 2 (two) times daily.   levothyroxine 150 MCG tablet Commonly known as: SYNTHROID Take 1 tablet (150 mcg total) by mouth daily before breakfast.   loperamide 2 MG capsule Commonly known as: IMODIUM Take 2 mg by mouth as needed for diarrhea or loose stools.    metroNIDAZOLE 500 MG tablet Commonly known as: Flagyl Take 1 tablet (500 mg total) by mouth 3 (three) times daily for 4 days.   NovoLIN 70/30 FlexPen Relion (70-30) 100 UNIT/ML KwikPen Generic drug: insulin isophane & regular human Inject 6-8 Units into the skin 3 (three) times daily as needed (CBG >140).   pregabalin 75 MG capsule Commonly known as: LYRICA 1 bid What changed:  how much to take how to take this when to take this additional instructions   tamsulosin 0.4 MG Caps capsule Commonly known as: FLOMAX Take 1 capsule (0.4 mg total) by mouth daily. Start taking on: March 23, 2019   VITA-C PO Take 1 capsule by mouth daily.   vitamin B-12 50 MCG tablet Commonly known as: CYANOCOBALAMIN Take 50 mcg by mouth daily.   Vitamin D3 10 MCG (400  UNIT) tablet Take 400 Units by mouth daily.               Durable Medical Equipment  (From admission, onward)           Start     Ordered   03/22/19 0708  For home use only DME Hospital bed  Once    Question Answer Comment  Length of Need Lifetime   The above medical condition requires: Patient requires the ability to reposition frequently   Head must be elevated greater than: 45 degrees   Bed type Semi-electric      03/22/19 0708           Follow-up Information     [facility] Follow up.   Specialty: Home Health Services Why: For home health services Contact information: [address] [place] [address] [phone]           Allergies  Allergen Reactions   Methotrexate Derivatives Other (See Comments)    Elevated creat. Levels   Actemra [Tocilizumab] Rash   Other Other (See Comments)    NO Blood products.  [name]- causes low blood sugar (pt states she was taking medication but cant remember what it was for.)   Penicillins Rash    Has patient had a PCN reaction causing immediate rash, facial/tongue/throat swelling, SOB or lightheadedness with hypotension:  Yes Has patient had a PCN reaction causing severe rash involving mucus membranes or skin necrosis: Yes  Has patient had a PCN reaction that required hospitalization No Has patient had a PCN reaction occurring within the last 10 years: No If all of the above answers are "NO", then may proceed with Cephalosporin use.    Chlorhexidine    Remicade [Infliximab] Other (See Comments)    Inadequate response    Zocor [Simvastatin] Other (See Comments)    Leg cramps     The results of significant diagnostics from this hospitalization (including imaging, microbiology, ancillary and laboratory) are listed below for reference.    Microbiology: No results found for this or any previous visit (from the past 240 hour(s)).   Procedures/Studies: CT ABDOMEN PELVIS WO CONTRAST  Result Date: 03/18/2019 CLINICAL DATA:  Ongoing abdominal pain. Recent severe colitis. EXAM: CT ABDOMEN AND PELVIS WITHOUT CONTRAST TECHNIQUE: Multidetector CT imaging of the abdomen and pelvis was performed following the standard protocol without IV contrast. COMPARISON:  CT 03/12/2019 FINDINGS: Lower chest: Small pleural effusions with adjacent atelectasis. Additional patchy opacity at the lung bases which were characterized on CT 6 days ago. Trace pericardial effusion, unchanged. Hepatobiliary: Unchanged cyst in the left lobe of the liver. No new hepatic abnormality. No calcified gallstone or biliary dilatation. No pericholecystic inflammation. Pancreas: Parenchymal atrophy. No ductal dilatation or inflammation. Spleen: Normal in size without focal abnormality. Adrenals/Urinary Tract: Normal adrenal glands. Mild bilateral hydronephrosis, right greater than left with perinephric edema. No ureteral calculi or obstruction. The urinary bladder is distended without bladder wall thickening. Probable cyst in the lower kidney again seen. Stomach/Bowel: Ingested material in the stomach. No gastric wall thickening. Few air-fluid levels within  nondilated small bowel. Enteric sutures in the cecum. Persistent colonic wall thickening involving the distal transverse and descending colon with pericolonic edema. Additional areas of colonic wall thickening involving the more proximal and distal most colon have resolved. Enteric sutures in the sigmoid. Vascular/Lymphatic: Aortic atherosclerosis. No aortic aneurysm. Calcification adjacent to the duodenum is likely a calcified lymph nodes, unchanged. No enlarged lymph nodes in the abdomen or  pelvis. Reproductive: Status post hysterectomy. No adnexal masses. Other: Subcutaneous soft tissue densities in the anterior abdominal wall typical of medication injection sites. No ascites. No free air. Musculoskeletal: Multilevel degenerative change in the spine. There are no acute or suspicious osseous abnormalities. IMPRESSION: 1. Persistent colonic wall thickening involving the distal transverse and descending colon, residual/persistent colitis. Additional colonic involvement of the more proximal distal colon has resolved over the past 6 days. 2. Distended urinary bladder. Mild bilateral hydronephrosis is likely secondary to the degree of bladder distension. 3. Small bilateral pleural effusions with adjacent atelectasis. Additional patchy opacities at the lung bases were characterized on CT 6 days ago. Aortic Atherosclerosis (ICD10-I70.0). Electronically Signed   By: Keith Rake M.D.   On: 03/18/2019 16:26   CT ABDOMEN PELVIS WO CONTRAST  Result Date: 03/12/2019 CLINICAL DATA:  79 year old female with history of COVID-19 pneumonia. Generalized weakness and diarrhea. EXAM: CT CHEST, ABDOMEN AND PELVIS WITHOUT CONTRAST TECHNIQUE: Multidetector CT imaging of the chest, abdomen and pelvis was performed following the standard protocol without IV contrast. COMPARISON:  Chest CT 08/05/2016.  CT the abdomen 01/27/2006. FINDINGS: CT CHEST FINDINGS Cardiovascular: Heart size is normal. There is no significant pericardial  fluid, thickening or pericardial calcification. There is aortic atherosclerosis, as well as atherosclerosis of the great vessels of the mediastinum and the coronary arteries, including calcified atherosclerotic plaque in the left main, left anterior descending, left circumflex and right coronary arteries. Severe calcifications of the aortic valve. Mediastinum/Nodes: No pathologically enlarged mediastinal or hilar lymph nodes. Please note that accurate exclusion of hilar adenopathy is limited on noncontrast CT scans. Esophagus is unremarkable in appearance. No axillary lymphadenopathy. Lungs/Pleura: Patchy areas of ground-glass attenuation, septal thickening, thickening of the peribronchovascular interstitium and regional architectural distortion in the mid to lower lungs bilaterally, likely to reflect mild post infectious or inflammatory scarring in this patient with history of recent COVID-19 infection. Trace bilateral pleural effusions lying dependently. No suspicious appearing pulmonary nodules or masses are noted. Musculoskeletal: There are no aggressive appearing lytic or blastic lesions noted in the visualized portions of the skeleton. CT ABDOMEN PELVIS FINDINGS Hepatobiliary: 1.4 cm low-attenuation lesion in segment 4A of the liver, incompletely characterized on today's non-contrast CT examination, but similar to the prior study and most compatible with a cyst. No other suspicious hepatic lesions. Unenhanced appearance of the gallbladder is unremarkable. Pancreas: No definite pancreatic mass or peripancreatic fluid collections or inflammatory changes are noted on today's noncontrast CT examination. Spleen: Unremarkable. Adrenals/Urinary Tract: Exophytic 2.4 cm low-attenuation lesion in the lower pole of the left kidney, incompletely characterized on today's noncontrast CT examination but statistically likely to represent a cyst. Unenhanced appearance of the right kidney and bilateral adrenal glands are normal  in appearance. No hydroureteronephrosis. Urinary bladder is nearly decompressed with an indwelling Foley balloon catheter. Small amount of gas in the non dependent portion of the urinary bladder is iatrogenic. Stomach/Bowel: Unenhanced appearance of the stomach is normal. No pathologic dilatation of small bowel or colon. Suture line near the rectosigmoid junction from prior partial bowel resection. Adjacent haziness and soft tissue stranding in the presacral soft tissues may be related to prior radiation therapy. Extensive mural thickening throughout the colon most severe from the transverse colon to the proximal sigmoid colon where there are surrounding inflammatory changes, compatible with a colitis. Vascular/Lymphatic: Aortic atherosclerosis. No lymphadenopathy noted in the abdomen or pelvis. Reproductive: Status post hysterectomy. Ovaries are not confidently identified may be surgically absent or atrophic. Other: No significant volume  of ascites.  No pneumoperitoneum. Musculoskeletal: There are no aggressive appearing lytic or blastic lesions noted in the visualized portions of the skeleton. IMPRESSION: 1. Findings are concerning for severe colitis, as above. 2. Findings in the lungs which likely reflect evolving post infectious or inflammatory scarring given the patient's history of recent COVID-19 infection. 3. Aortic atherosclerosis, in addition to left main and 3 vessel coronary artery disease. 4. There are calcifications of the aortic valve. Echocardiographic correlation for evaluation of potential valvular dysfunction may be warranted if clinically indicated. 5. Additional incidental findings, as above. Electronically Signed   By: Vinnie Langton M.D.   On: 03/12/2019 09:49   CT Head Wo Contrast  Result Date: 03/09/2019 CLINICAL DATA:  Altered mental status and increasing confusion, history of COVID-19 positivity EXAM: CT HEAD WITHOUT CONTRAST TECHNIQUE: Contiguous axial images were obtained from the  base of the skull through the vertex without intravenous contrast. COMPARISON:  02/10/2018 FINDINGS: Brain: Mild atrophic changes and chronic white matter ischemic changes are seen stable from the prior exam. No findings to suggest acute hemorrhage, acute infarction or space-occupying mass lesion are noted. Vascular: No hyperdense vessel or unexpected calcification. Skull: Changes consistent with prior craniotomy are noted in right temporoparietal region Sinuses/Orbits: No acute finding. Other: None. IMPRESSION: Chronic atrophic and ischemic changes without acute abnormality. Electronically Signed   By: Inez Catalina M.D.   On: 03/09/2019 21:10   CT CHEST WO CONTRAST  Result Date: 03/12/2019 CLINICAL DATA:  79 year old female with history of COVID-19 pneumonia. Generalized weakness and diarrhea. EXAM: CT CHEST, ABDOMEN AND PELVIS WITHOUT CONTRAST TECHNIQUE: Multidetector CT imaging of the chest, abdomen and pelvis was performed following the standard protocol without IV contrast. COMPARISON:  Chest CT 08/05/2016.  CT the abdomen 01/27/2006. FINDINGS: CT CHEST FINDINGS Cardiovascular: Heart size is normal. There is no significant pericardial fluid, thickening or pericardial calcification. There is aortic atherosclerosis, as well as atherosclerosis of the great vessels of the mediastinum and the coronary arteries, including calcified atherosclerotic plaque in the left main, left anterior descending, left circumflex and right coronary arteries. Severe calcifications of the aortic valve. Mediastinum/Nodes: No pathologically enlarged mediastinal or hilar lymph nodes. Please note that accurate exclusion of hilar adenopathy is limited on noncontrast CT scans. Esophagus is unremarkable in appearance. No axillary lymphadenopathy. Lungs/Pleura: Patchy areas of ground-glass attenuation, septal thickening, thickening of the peribronchovascular interstitium and regional architectural distortion in the mid to lower lungs  bilaterally, likely to reflect mild post infectious or inflammatory scarring in this patient with history of recent COVID-19 infection. Trace bilateral pleural effusions lying dependently. No suspicious appearing pulmonary nodules or masses are noted. Musculoskeletal: There are no aggressive appearing lytic or blastic lesions noted in the visualized portions of the skeleton. CT ABDOMEN PELVIS FINDINGS Hepatobiliary: 1.4 cm low-attenuation lesion in segment 4A of the liver, incompletely characterized on today's non-contrast CT examination, but similar to the prior study and most compatible with a cyst. No other suspicious hepatic lesions. Unenhanced appearance of the gallbladder is unremarkable. Pancreas: No definite pancreatic mass or peripancreatic fluid collections or inflammatory changes are noted on today's noncontrast CT examination. Spleen: Unremarkable. Adrenals/Urinary Tract: Exophytic 2.4 cm low-attenuation lesion in the lower pole of the left kidney, incompletely characterized on today's noncontrast CT examination but statistically likely to represent a cyst. Unenhanced appearance of the right kidney and bilateral adrenal glands are normal in appearance. No hydroureteronephrosis. Urinary bladder is nearly decompressed with an indwelling Foley balloon catheter. Small amount of gas in the non  dependent portion of the urinary bladder is iatrogenic. Stomach/Bowel: Unenhanced appearance of the stomach is normal. No pathologic dilatation of small bowel or colon. Suture line near the rectosigmoid junction from prior partial bowel resection. Adjacent haziness and soft tissue stranding in the presacral soft tissues may be related to prior radiation therapy. Extensive mural thickening throughout the colon most severe from the transverse colon to the proximal sigmoid colon where there are surrounding inflammatory changes, compatible with a colitis. Vascular/Lymphatic: Aortic atherosclerosis. No lymphadenopathy noted  in the abdomen or pelvis. Reproductive: Status post hysterectomy. Ovaries are not confidently identified may be surgically absent or atrophic. Other: No significant volume of ascites.  No pneumoperitoneum. Musculoskeletal: There are no aggressive appearing lytic or blastic lesions noted in the visualized portions of the skeleton. IMPRESSION: 1. Findings are concerning for severe colitis, as above. 2. Findings in the lungs which likely reflect evolving post infectious or inflammatory scarring given the patient's history of recent COVID-19 infection. 3. Aortic atherosclerosis, in addition to left main and 3 vessel coronary artery disease. 4. There are calcifications of the aortic valve. Echocardiographic correlation for evaluation of potential valvular dysfunction may be warranted if clinically indicated. 5. Additional incidental findings, as above. Electronically Signed   By: Vinnie Langton M.D.   On: 03/12/2019 09:49   MR BRAIN WO CONTRAST  Result Date: 03/11/2019 CLINICAL DATA:  Encephalopathy, history of meningioma resection EXAM: MRI HEAD WITHOUT CONTRAST TECHNIQUE: Multiplanar, multiecho pulse sequences of the brain and surrounding structures were obtained without intravenous contrast. COMPARISON:  2018 FINDINGS: Motion artifact is present. Brain: There is no acute infarction or intracranial hemorrhage. There is no intracranial mass, mass effect, or edema. There is no hydrocephalus or extra-axial fluid collection. Patchy T2 hyperintensity in the supratentorial white matter is nonspecific but probably reflects stable chronic microvascular ischemic changes. Vascular: Major vessel flow voids at the skull base are preserved. Skull and upper cervical spine: Right frontal craniotomy. Normal marrow signal is preserved. Sinuses/Orbits: Paranasal sinuses are aerated. No significant orbital abnormality. Other: Sella is unremarkable.  Mastoid air cells are clear. IMPRESSION: Motion degraded. No acute infarction or  other acute abnormality. Stable chronic microvascular ischemic changes. Electronically Signed   By: Macy Mis M.D.   On: 03/11/2019 13:03   DG Chest Portable 1 View  Result Date: 03/09/2019 CLINICAL DATA:  Altered mental status. Possible pulmonary infection. EXAM: PORTABLE CHEST 1 VIEW COMPARISON:  February 14, 2019. FINDINGS: Stable mild cardiomegaly. Central pulmonary vascular congestion without overt pulmonary edema or obvious pleural effusion. Low lung volumes. Linear atelectasis in the left lower lung zone. No pneumonia. Moderate skeletal degenerative change and telemetry leads. IMPRESSION: Stable mild cardiomegaly with central pulmonary vascular congestion. No pneumonia or pulmonary edema. Electronically Signed   By: Revonda Humphrey   On: 03/09/2019 21:41   Overnight EEG with video  Result Date: 03/10/2019 Lora Havens, MD     03/11/2019  9:03 AM Patient Name: [name] MRN: [id number] Epilepsy Attending: Lora Havens Referring Physician/Provider: Dr Roland Rack Duration: 03/09/2019 2308 to 03/10/2019 2308 Patient history: 79 year old female presenting with polymyoclonus, fever of unclear etiology and altered mental status. EEG to evaluate for seizure. Level of alertness: awake/lethargic AEDs during EEG study: None Technical aspects: This EEG study was done with scalp electrodes positioned according to the 10-20 International system of electrode placement. Electrical activity was acquired at a sampling rate of 500Hz  and reviewed with a high frequency filter of 70Hz  and a low frequency filter of 1Hz . EEG  data were recorded continuously and digitally stored. DESCRIPTION: No clear posterior dominant rhythm was seen. EEG showed continuous generalized and maximal right frontocentral region. Sharp waves were also seen in right frontocentral region. Hyperventilation and photic stimulation were not performed. Event button was pressed on 03/10/2019 at 1016 for unclear reason. Cocomitant eeg  before, during and after the event didn't show any eeg change to suggest seizure. 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 No seizures were seen throughout the recording. Event button was pressed on 03/10/2019 at 1016 for unclear reason. Cocomitant eeg before, during and after the event didn't show any eeg change to suggest seizure. Priyanka O Yadav   VAS [place] LOWER EXTREMITY VENOUS (DVT)  Result Date: 03/10/2019  Lower Venous DVTStudy Indications: Elevated Ddimer.  Risk Factors: COVID 19 positive. Comparison Study: No prior studies. Performing Technologist: Oliver Hum RVT  Examination Guidelines: A complete evaluation includes B-mode imaging, spectral Doppler, color Doppler, and power Doppler as needed of all accessible portions of each vessel. Bilateral testing is considered an integral part of a complete examination. Limited examinations for reoccurring indications may be performed as noted. The reflux portion of the exam is performed with the patient in reverse Trendelenburg.  +---------+---------------+---------+-----------+----------+--------------+ RIGHT    CompressibilityPhasicitySpontaneityPropertiesThrombus Aging +---------+---------------+---------+-----------+----------+--------------+ CFV      Full           Yes      Yes                                 +---------+---------------+---------+-----------+----------+--------------+ SFJ      Full                                                        +---------+---------------+---------+-----------+----------+--------------+ FV Prox  Full                                                        +---------+---------------+---------+-----------+----------+--------------+ FV Mid   Full                                                         +---------+---------------+---------+-----------+----------+--------------+ FV DistalFull                                                        +---------+---------------+---------+-----------+----------+--------------+ PFV      Full                                                        +---------+---------------+---------+-----------+----------+--------------+  POP      Full           Yes      Yes                                 +---------+---------------+---------+-----------+----------+--------------+ PTV      Full                                                        +---------+---------------+---------+-----------+----------+--------------+ PERO     Full                                                        +---------+---------------+---------+-----------+----------+--------------+   +---------+---------------+---------+-----------+----------+--------------+ LEFT     CompressibilityPhasicitySpontaneityPropertiesThrombus Aging +---------+---------------+---------+-----------+----------+--------------+ CFV      Full           Yes      Yes                                 +---------+---------------+---------+-----------+----------+--------------+ SFJ      Full                                                        +---------+---------------+---------+-----------+----------+--------------+ FV Prox  Full                                                        +---------+---------------+---------+-----------+----------+--------------+ FV Mid   Full                                                        +---------+---------------+---------+-----------+----------+--------------+ FV Distal               Yes      Yes                                 +---------+---------------+---------+-----------+----------+--------------+ PFV      Full                                                         +---------+---------------+---------+-----------+----------+--------------+ POP      Full           Yes      Yes                                 +---------+---------------+---------+-----------+----------+--------------+  PTV      Full                                                        +---------+---------------+---------+-----------+----------+--------------+ PERO     Full                                                        +---------+---------------+---------+-----------+----------+--------------+     Summary: RIGHT: - There is no evidence of deep vein thrombosis in the lower extremity. However, portions of this examination were limited- see technologist comments above.  - No cystic structure found in the popliteal fossa.  LEFT: - There is no evidence of deep vein thrombosis in the lower extremity. However, portions of this examination were limited- see technologist comments above.  - No cystic structure found in the popliteal fossa.  *See table(s) above for measurements and observations. Electronically signed by Servando Snare MD on 03/10/2019 at 4:08:34 PM.    Final    VAS [place] LOWER EXTREMITY VENOUS (DVT)  Result Date: 02/22/2019  Lower Venous DVTStudy Indications: Hx of DVT.  Limitations: Body habitus and sunlight in the room, uncooperative patient. Performing Technologist: Antonieta Pert RDMS, RVT  Examination Guidelines: A complete evaluation includes B-mode imaging, spectral Doppler, color Doppler, and power Doppler as needed of all accessible portions of each vessel. Bilateral testing is considered an integral part of a complete examination. Limited examinations for reoccurring indications may be performed as noted. The reflux portion of the exam is performed with the patient in reverse Trendelenburg.  +---------+---------------+---------+-----------+----------+-------------------+ RIGHT    CompressibilityPhasicitySpontaneityPropertiesThrombus Aging       +---------+---------------+---------+-----------+----------+-------------------+ CFV      Full           Yes      Yes                                      +---------+---------------+---------+-----------+----------+-------------------+ SFJ      Full                                                             +---------+---------------+---------+-----------+----------+-------------------+ FV Prox  Full                                                             +---------+---------------+---------+-----------+----------+-------------------+ FV Mid   Full                                                             +---------+---------------+---------+-----------+----------+-------------------+ FV DistalFull                                                             +---------+---------------+---------+-----------+----------+-------------------+  PFV      Full                                                             +---------+---------------+---------+-----------+----------+-------------------+ POP                                                   Not visualized      +---------+---------------+---------+-----------+----------+-------------------+ PTV                                                   poor visualization                                                        due to pt                                                                 cooperation and                                                           tolerance.          +---------+---------------+---------+-----------+----------+-------------------+ PERO                                                  poor visualization                                                        due to pt                                                                 cooperation and  tolerance.           +---------+---------------+---------+-----------+----------+-------------------+ GSV      Full                                                             +---------+---------------+---------+-----------+----------+-------------------+   +---------+---------------+---------+-----------+----------+-------------------+ LEFT     CompressibilityPhasicitySpontaneityPropertiesThrombus Aging      +---------+---------------+---------+-----------+----------+-------------------+ CFV      Full           Yes      Yes                                      +---------+---------------+---------+-----------+----------+-------------------+ SFJ      Full                                                             +---------+---------------+---------+-----------+----------+-------------------+ FV Prox  Full                                                             +---------+---------------+---------+-----------+----------+-------------------+ FV Mid   Full                                                             +---------+---------------+---------+-----------+----------+-------------------+ FV Distal                                             Not visualized      +---------+---------------+---------+-----------+----------+-------------------+ PFV      Full                                                             +---------+---------------+---------+-----------+----------+-------------------+ POP                     Yes      Yes                  unable to compress                                                        due to pt  cooperation and                                                           tolerance.          +---------+---------------+---------+-----------+----------+-------------------+ PTV                                                   not well visualized  +---------+---------------+---------+-----------+----------+-------------------+ PERO                                                  not well visualized +---------+---------------+---------+-----------+----------+-------------------+ GSV      Full                                                             +---------+---------------+---------+-----------+----------+-------------------+     Summary: RIGHT: - There is no evidence of deep vein thrombosis in the lower extremity. However, portions of this examination were limited- see technologist comments above.  - A cystic structure is found in the popliteal fossa.  LEFT: - There is no evidence of deep vein thrombosis in the lower extremity. However, portions of this examination were limited- see technologist comments above.  - No cystic structure found in the popliteal fossa.  *See table(s) above for measurements and observations. Electronically signed by Deitra Mayo MD on 02/22/2019 at 4:12:36 PM.    Final    DG FL GUIDED LUMBAR PUNCTURE  Result Date: 03/11/2019 CLINICAL DATA:  Mental status changes.  COVID-19 positive. EXAM: DIAGNOSTIC LUMBAR PUNCTURE UNDER FLUOROSCOPIC GUIDANCE FLUOROSCOPY TIME:  Fluoroscopy Time:  1 minutes and 24 seconds Radiation Exposure Index (if provided by the fluoroscopic device): Not applicable. Number of Acquired Spot Images: 0 PROCEDURE: Informed consent was obtained from the patient's daughter prior to the procedure, including potential complications of bleeding and infection. With the patient prone, the lower back was prepped with Betadine. 1% Lidocaine was used for local anesthesia. Lumbar puncture was performed at the L4/5 level using a 20 gauge needle with return of initially blood tinged CSF with an opening pressure of 9 cm water. 9 ml of CSF were obtained for laboratory studies. The patient tolerated the procedure well and there were no apparent complications. IMPRESSION: Successful lumbar puncture under  fluoroscopic guidance, as detailed above. Electronically Signed   By: Abigail Miyamoto M.D.   On: 03/11/2019 15:03     Labs: BNP (last 3 results) Recent Labs    03/15/19 0448 03/16/19 0457 03/17/19 0408  BNP 338.2* 295.4* 244.0*   Basic Metabolic Panel: Recent Labs  Lab 03/16/19 0457 03/16/19 0457 03/17/19 0408 03/18/19 0902 03/19/19 0801 03/20/19 0507 03/21/19 0329  NA 144   < > 144 144 145 145 146*  K 3.8   < > 3.4* 3.7 3.9 3.4* 3.7  CL 114*   < > 114* 117* 117* 116*  116*  CO2 20*   < > 20* 17* 20* 20* 22  GLUCOSE 119*   < > 114* 139* 136* 111* 120*  BUN 24*   < > 28* 27* 28* 24* 23  CREATININE 2.14*   < > 2.06* 1.91* 1.80* 1.68* 1.77*  CALCIUM 8.4*   < > 8.1* 8.3* 8.1* 8.2* 8.3*  MG 2.0  --  1.7  --   --   --   --    < > = values in this interval not displayed.   Liver Function Tests: Recent Labs  Lab 03/16/19 0457 03/17/19 0408 03/21/19 0329  AST 30 32 17  ALT 12 12 11   ALKPHOS 150* 134* 94  BILITOT 0.5 0.2* 0.3  PROT 5.3* 4.9* 4.8*  ALBUMIN 1.8* 1.6* 1.7*   No results for input(s): LIPASE, AMYLASE in the last 168 hours. No results for input(s): AMMONIA in the last 168 hours. CBC: Recent Labs  Lab 03/16/19 0457 03/16/19 0457 03/17/19 0408 03/17/19 0408 03/18/19 0902 03/18/19 0902 03/19/19 1036 03/20/19 0507 03/20/19 0822 03/21/19 0329 03/22/19 0355  WBC 8.1   < > 7.7   < > 10.1  --  8.9 7.9  --  8.2 8.8  NEUTROABS 5.4  --  4.5  --   --   --   --   --   --   --   --   HGB 8.4*   < > 7.4*   < > 7.5*   < > 7.1* 6.8* 6.6* 6.7* 7.8*  HCT 26.4*   < > 23.1*   < > 23.4*   < > 22.1* 21.3* 20.9* 20.3* 24.6*  MCV 89.8   < > 90.6   < > 91.1  --  91.3 91.0  --  90.6 92.8  PLT 387   < > 360   < > 406*  --  415* 397  --  401* 437*   < > = values in this interval not displayed.   Cardiac Enzymes: No results for input(s): CKTOTAL, CKMB, CKMBINDEX, TROPONINI in the last 168 hours. BNP: Invalid input(s): POCBNP CBG: Recent Labs  Lab 03/21/19 0653  03/21/19 1107 03/21/19 1608 03/21/19 2046 03/22/19 0724  GLUCAP 118* 170* 89 100* 119*   D-Dimer No results for input(s): DDIMER in the last 72 hours. Hgb A1c No results for input(s): HGBA1C in the last 72 hours. Lipid Profile No results for input(s): CHOL, HDL, LDLCALC, TRIG, CHOLHDL, LDLDIRECT in the last 72 hours. Thyroid function studies No results for input(s): TSH, T4TOTAL, T3FREE, THYROIDAB in the last 72 hours.  Invalid input(s): FREET3 Anemia work up No results for input(s): VITAMINB12, FOLATE, FERRITIN, TIBC, IRON, RETICCTPCT in the last 72 hours. Urinalysis    Component Value Date/Time   COLORURINE YELLOW 03/09/2019 1921   APPEARANCEUR HAZY (A) 03/09/2019 1921   LABSPEC 1.013 03/09/2019 1921   LABSPEC 1.030 01/10/2010 1424   PHURINE 5.0 03/09/2019 1921   GLUCOSEU NEGATIVE 03/09/2019 1921   HGBUR NEGATIVE 03/09/2019 1921   BILIRUBINUR NEGATIVE 03/09/2019 1921   BILIRUBINUR not done 01/10/2010 [place] 03/09/2019 1921   PROTEINUR 100 (A) 03/09/2019 1921   UROBILINOGEN 0.2 09/20/2014 1643   NITRITE NEGATIVE 03/09/2019 1921   LEUKOCYTESUR NEGATIVE 03/09/2019 1921   LEUKOCYTESUR Large 01/10/2010 1424   Sepsis Labs Invalid input(s): PROCALCITONIN,  WBC,  LACTICIDVEN Microbiology No results found for this or any previous visit (from the past 240 hour(s)).    Time coordinating discharge: 35  minutes  SIGNED: Antonieta Pert, MD  [facility] 03/22/2019, 1:38 PM  If 7PM-7AM, please contact night-coverage [email or web address]

## 2019-03-22 NOTE — Telephone Encounter (Signed)
Patient's daughter [name] called stating her mom was just discharged from the hospital where she has been for the past couple of weeks.  [name] states she is trying to follow-up with her doctors to find out if she needs to schedule an appointment.  [name] states her mom has missed a few [place] infusions.  Tracie requested a return call at [phone]

## 2019-03-23 ENCOUNTER — Other Ambulatory Visit: Payer: Self-pay | Admitting: Gastroenterology

## 2019-03-23 DIAGNOSIS — K529 Noninfective gastroenteritis and colitis, unspecified: Secondary | ICD-10-CM

## 2019-03-23 NOTE — Telephone Encounter (Signed)
[name] advised she may stay only on [place] for right now.  Patient schedule a virtual or office visit in a month to discuss when to start [place].

## 2019-03-23 NOTE — Telephone Encounter (Signed)
She may stay only on [place] for right now.  Patient schedule a virtual or office visit in a month to discuss when to start [place].

## 2019-03-23 NOTE — Telephone Encounter (Signed)
[name], patient's daughter, states patient was released from the hospital 03/22/19. [name] is working on setting up follow up appointment with Ms. Davidson providers now that she has been released from the hospital. [name] states Ms. Regula was released from the hospital on Metronidazole for the next 4 days, Tamsulosin, 0.4 mg daily, Keppra 5 mL twice daily and Iron tablet 325 mg daily. Patient has not had her last several [place]cia infusions. Patient is also on [place]. [name] would like to know when her mother should restart the medications. Tracie would also like when you would like for her to follow up. Please advise.

## 2019-03-25 DIAGNOSIS — E039 Hypothyroidism, unspecified: Secondary | ICD-10-CM | POA: Diagnosis not present

## 2019-03-25 DIAGNOSIS — Z9889 Other specified postprocedural states: Secondary | ICD-10-CM | POA: Diagnosis not present

## 2019-03-25 DIAGNOSIS — E669 Obesity, unspecified: Secondary | ICD-10-CM | POA: Diagnosis not present

## 2019-03-25 DIAGNOSIS — E1142 Type 2 diabetes mellitus with diabetic polyneuropathy: Secondary | ICD-10-CM | POA: Diagnosis not present

## 2019-03-25 DIAGNOSIS — Z794 Long term (current) use of insulin: Secondary | ICD-10-CM | POA: Diagnosis not present

## 2019-03-28 DIAGNOSIS — E1142 Type 2 diabetes mellitus with diabetic polyneuropathy: Secondary | ICD-10-CM | POA: Diagnosis not present

## 2019-03-28 DIAGNOSIS — M0579 Rheumatoid arthritis with rheumatoid factor of multiple sites without organ or systems involvement: Secondary | ICD-10-CM | POA: Diagnosis not present

## 2019-03-28 DIAGNOSIS — M150 Primary generalized (osteo)arthritis: Secondary | ICD-10-CM | POA: Diagnosis not present

## 2019-03-28 DIAGNOSIS — G8929 Other chronic pain: Secondary | ICD-10-CM | POA: Diagnosis not present

## 2019-03-28 DIAGNOSIS — U071 COVID-19: Secondary | ICD-10-CM | POA: Diagnosis not present

## 2019-03-28 DIAGNOSIS — M545 Low back pain: Secondary | ICD-10-CM | POA: Diagnosis not present

## 2019-03-29 ENCOUNTER — Ambulatory Visit: Payer: Medicare Other | Admitting: Sports Medicine

## 2019-03-29 DIAGNOSIS — G8929 Other chronic pain: Secondary | ICD-10-CM | POA: Diagnosis not present

## 2019-03-29 DIAGNOSIS — M150 Primary generalized (osteo)arthritis: Secondary | ICD-10-CM | POA: Diagnosis not present

## 2019-03-29 DIAGNOSIS — E1142 Type 2 diabetes mellitus with diabetic polyneuropathy: Secondary | ICD-10-CM | POA: Diagnosis not present

## 2019-03-29 DIAGNOSIS — U071 COVID-19: Secondary | ICD-10-CM | POA: Diagnosis not present

## 2019-03-29 DIAGNOSIS — M0579 Rheumatoid arthritis with rheumatoid factor of multiple sites without organ or systems involvement: Secondary | ICD-10-CM | POA: Diagnosis not present

## 2019-03-29 DIAGNOSIS — M545 Low back pain: Secondary | ICD-10-CM | POA: Diagnosis not present

## 2019-03-30 DIAGNOSIS — G8929 Other chronic pain: Secondary | ICD-10-CM | POA: Diagnosis not present

## 2019-03-30 DIAGNOSIS — I493 Ventricular premature depolarization: Secondary | ICD-10-CM | POA: Diagnosis not present

## 2019-03-30 DIAGNOSIS — I447 Left bundle-branch block, unspecified: Secondary | ICD-10-CM | POA: Diagnosis not present

## 2019-03-30 DIAGNOSIS — K580 Irritable bowel syndrome with diarrhea: Secondary | ICD-10-CM | POA: Diagnosis not present

## 2019-03-30 DIAGNOSIS — I130 Hypertensive heart and chronic kidney disease with heart failure and stage 1 through stage 4 chronic kidney disease, or unspecified chronic kidney disease: Secondary | ICD-10-CM | POA: Diagnosis not present

## 2019-03-30 DIAGNOSIS — E1142 Type 2 diabetes mellitus with diabetic polyneuropathy: Secondary | ICD-10-CM | POA: Diagnosis not present

## 2019-03-30 DIAGNOSIS — F0280 Dementia in other diseases classified elsewhere without behavioral disturbance: Secondary | ICD-10-CM | POA: Diagnosis not present

## 2019-03-30 DIAGNOSIS — K529 Noninfective gastroenteritis and colitis, unspecified: Secondary | ICD-10-CM | POA: Diagnosis not present

## 2019-03-30 DIAGNOSIS — M7121 Synovial cyst of popliteal space [Baker], right knee: Secondary | ICD-10-CM | POA: Diagnosis not present

## 2019-03-30 DIAGNOSIS — E1122 Type 2 diabetes mellitus with diabetic chronic kidney disease: Secondary | ICD-10-CM | POA: Diagnosis not present

## 2019-03-30 DIAGNOSIS — G40909 Epilepsy, unspecified, not intractable, without status epilepticus: Secondary | ICD-10-CM | POA: Diagnosis not present

## 2019-03-30 DIAGNOSIS — I459 Conduction disorder, unspecified: Secondary | ICD-10-CM | POA: Diagnosis not present

## 2019-03-30 DIAGNOSIS — I5032 Chronic diastolic (congestive) heart failure: Secondary | ICD-10-CM | POA: Diagnosis not present

## 2019-03-30 DIAGNOSIS — M1A30X0 Chronic gout due to renal impairment, unspecified site, without tophus (tophi): Secondary | ICD-10-CM | POA: Diagnosis not present

## 2019-03-30 DIAGNOSIS — I350 Nonrheumatic aortic (valve) stenosis: Secondary | ICD-10-CM | POA: Diagnosis not present

## 2019-03-30 DIAGNOSIS — H35033 Hypertensive retinopathy, bilateral: Secondary | ICD-10-CM | POA: Diagnosis not present

## 2019-03-30 DIAGNOSIS — M150 Primary generalized (osteo)arthritis: Secondary | ICD-10-CM | POA: Diagnosis not present

## 2019-03-30 DIAGNOSIS — J479 Bronchiectasis, uncomplicated: Secondary | ICD-10-CM | POA: Diagnosis not present

## 2019-03-30 DIAGNOSIS — I2510 Atherosclerotic heart disease of native coronary artery without angina pectoris: Secondary | ICD-10-CM | POA: Diagnosis not present

## 2019-03-30 DIAGNOSIS — M545 Low back pain: Secondary | ICD-10-CM | POA: Diagnosis not present

## 2019-03-30 DIAGNOSIS — D573 Sickle-cell trait: Secondary | ICD-10-CM | POA: Diagnosis not present

## 2019-03-30 DIAGNOSIS — D631 Anemia in chronic kidney disease: Secondary | ICD-10-CM | POA: Diagnosis not present

## 2019-03-30 DIAGNOSIS — N136 Pyonephrosis: Secondary | ICD-10-CM | POA: Diagnosis not present

## 2019-03-30 DIAGNOSIS — I951 Orthostatic hypotension: Secondary | ICD-10-CM | POA: Diagnosis not present

## 2019-03-30 DIAGNOSIS — N184 Chronic kidney disease, stage 4 (severe): Secondary | ICD-10-CM | POA: Diagnosis not present

## 2019-03-31 DIAGNOSIS — M150 Primary generalized (osteo)arthritis: Secondary | ICD-10-CM | POA: Diagnosis not present

## 2019-03-31 DIAGNOSIS — F0280 Dementia in other diseases classified elsewhere without behavioral disturbance: Secondary | ICD-10-CM | POA: Diagnosis not present

## 2019-03-31 DIAGNOSIS — G40909 Epilepsy, unspecified, not intractable, without status epilepticus: Secondary | ICD-10-CM | POA: Diagnosis not present

## 2019-03-31 DIAGNOSIS — I10 Essential (primary) hypertension: Secondary | ICD-10-CM | POA: Diagnosis not present

## 2019-03-31 DIAGNOSIS — N136 Pyonephrosis: Secondary | ICD-10-CM | POA: Diagnosis not present

## 2019-03-31 DIAGNOSIS — G9341 Metabolic encephalopathy: Secondary | ICD-10-CM | POA: Diagnosis not present

## 2019-03-31 DIAGNOSIS — U071 COVID-19: Secondary | ICD-10-CM | POA: Diagnosis not present

## 2019-03-31 DIAGNOSIS — I130 Hypertensive heart and chronic kidney disease with heart failure and stage 1 through stage 4 chronic kidney disease, or unspecified chronic kidney disease: Secondary | ICD-10-CM | POA: Diagnosis not present

## 2019-03-31 DIAGNOSIS — E1142 Type 2 diabetes mellitus with diabetic polyneuropathy: Secondary | ICD-10-CM | POA: Diagnosis not present

## 2019-03-31 DIAGNOSIS — K529 Noninfective gastroenteritis and colitis, unspecified: Secondary | ICD-10-CM | POA: Diagnosis not present

## 2019-04-01 ENCOUNTER — Telehealth: Payer: Self-pay

## 2019-04-01 NOTE — Telephone Encounter (Signed)
Palliative Medicine RN Note: Rec'd a call from patient's daughter Chrystie Nose, who was anxious and sounded very tearful. She explained that Mrs Idler has stopped taking her medications and stopped eating, and [name] feels she is at the end of her life. We discussed the difference between hospice and palliative care.  [name] expressed concern that there is "a DNR" in her mother's chart. I checked the ACP tab while [name] was on the phone, as well as the media tab, and all I found was the card stating that she is a Jehovah's Witness and doesn't want blood, as well as the statement that she doesn't want her life artificially prolonged (not a gold DNR form). While meeting with our NP, she did elect to have a Do Not Intubate order during her last hospitalization. No MOST is on her chart as of this phone call.   Unfortunately, I have limited options to support [name] or Mrs Mokry, as our team only cares for patients actively admitted to a [facility] facility. With Tracy's permission, I called Venia Carbon with [employer] [facility]) so she can call [name] to explain what hospice can and cannot do, in the hopes that they can provide the support this family needs.  Marjie Skiff Sherel Fennell, RN, BSN, [facility] Palliative Medicine Team 04/01/2019 2:07 PM Office [phone]

## 2019-04-02 DIAGNOSIS — I509 Heart failure, unspecified: Secondary | ICD-10-CM | POA: Diagnosis not present

## 2019-04-02 DIAGNOSIS — E039 Hypothyroidism, unspecified: Secondary | ICD-10-CM | POA: Diagnosis not present

## 2019-04-02 DIAGNOSIS — G9340 Encephalopathy, unspecified: Secondary | ICD-10-CM | POA: Diagnosis not present

## 2019-04-02 DIAGNOSIS — N189 Chronic kidney disease, unspecified: Secondary | ICD-10-CM | POA: Diagnosis not present

## 2019-04-02 DIAGNOSIS — E785 Hyperlipidemia, unspecified: Secondary | ICD-10-CM | POA: Diagnosis not present

## 2019-04-02 DIAGNOSIS — I130 Hypertensive heart and chronic kidney disease with heart failure and stage 1 through stage 4 chronic kidney disease, or unspecified chronic kidney disease: Secondary | ICD-10-CM | POA: Diagnosis not present

## 2019-04-02 DIAGNOSIS — H409 Unspecified glaucoma: Secondary | ICD-10-CM | POA: Diagnosis not present

## 2019-04-02 DIAGNOSIS — M109 Gout, unspecified: Secondary | ICD-10-CM | POA: Diagnosis not present

## 2019-04-02 DIAGNOSIS — I350 Nonrheumatic aortic (valve) stenosis: Secondary | ICD-10-CM | POA: Diagnosis not present

## 2019-04-02 DIAGNOSIS — Z8616 Personal history of COVID-19: Secondary | ICD-10-CM | POA: Diagnosis not present

## 2019-04-02 DIAGNOSIS — Z8661 Personal history of infections of the central nervous system: Secondary | ICD-10-CM | POA: Diagnosis not present

## 2019-04-04 DIAGNOSIS — E785 Hyperlipidemia, unspecified: Secondary | ICD-10-CM | POA: Diagnosis not present

## 2019-04-04 DIAGNOSIS — N189 Chronic kidney disease, unspecified: Secondary | ICD-10-CM | POA: Diagnosis not present

## 2019-04-04 DIAGNOSIS — I350 Nonrheumatic aortic (valve) stenosis: Secondary | ICD-10-CM | POA: Diagnosis not present

## 2019-04-04 DIAGNOSIS — I130 Hypertensive heart and chronic kidney disease with heart failure and stage 1 through stage 4 chronic kidney disease, or unspecified chronic kidney disease: Secondary | ICD-10-CM | POA: Diagnosis not present

## 2019-04-04 DIAGNOSIS — G9340 Encephalopathy, unspecified: Secondary | ICD-10-CM | POA: Diagnosis not present

## 2019-04-04 DIAGNOSIS — I509 Heart failure, unspecified: Secondary | ICD-10-CM | POA: Diagnosis not present

## 2019-04-05 DIAGNOSIS — I509 Heart failure, unspecified: Secondary | ICD-10-CM | POA: Diagnosis not present

## 2019-04-05 DIAGNOSIS — I130 Hypertensive heart and chronic kidney disease with heart failure and stage 1 through stage 4 chronic kidney disease, or unspecified chronic kidney disease: Secondary | ICD-10-CM | POA: Diagnosis not present

## 2019-04-05 DIAGNOSIS — N189 Chronic kidney disease, unspecified: Secondary | ICD-10-CM | POA: Diagnosis not present

## 2019-04-05 DIAGNOSIS — I350 Nonrheumatic aortic (valve) stenosis: Secondary | ICD-10-CM | POA: Diagnosis not present

## 2019-04-05 DIAGNOSIS — E785 Hyperlipidemia, unspecified: Secondary | ICD-10-CM | POA: Diagnosis not present

## 2019-04-05 DIAGNOSIS — G9340 Encephalopathy, unspecified: Secondary | ICD-10-CM | POA: Diagnosis not present

## 2019-04-07 DEATH — deceased

## 2019-04-19 ENCOUNTER — Other Ambulatory Visit: Payer: Medicare Other

## 2019-04-22 ENCOUNTER — Telehealth: Payer: Self-pay | Admitting: Sports Medicine

## 2019-04-22 NOTE — Telephone Encounter (Signed)
Thanks. She was a great patient. She will be missed! -Dr. Chauncey Cruel

## 2019-04-22 NOTE — Telephone Encounter (Signed)
Called pt to get her scheduled to pick up her diabetic shoes and pts daughter answered and stated pt had passed away on 06-Apr-2019... I apologized for her loss.

## 2019-05-18 ENCOUNTER — Other Ambulatory Visit (HOSPITAL_COMMUNITY): Payer: Medicare Other
